# Patient Record
Sex: Female | Born: 1935 | Race: White | Hispanic: No | Marital: Married | Smoking: Never smoker
Health system: Southern US, Community
[De-identification: ages and names within clinical notes are randomized; demographics above are authoritative.]

## PROBLEM LIST (undated history)

## (undated) DIAGNOSIS — M8580 Other specified disorders of bone density and structure, unspecified site: Secondary | ICD-10-CM

## (undated) DIAGNOSIS — H409 Unspecified glaucoma: Secondary | ICD-10-CM

## (undated) DIAGNOSIS — H018 Other specified inflammations of eyelid: Secondary | ICD-10-CM

## (undated) DIAGNOSIS — K769 Liver disease, unspecified: Secondary | ICD-10-CM

## (undated) DIAGNOSIS — M1990 Unspecified osteoarthritis, unspecified site: Secondary | ICD-10-CM

## (undated) DIAGNOSIS — M549 Dorsalgia, unspecified: Secondary | ICD-10-CM

## (undated) DIAGNOSIS — R0600 Dyspnea, unspecified: Secondary | ICD-10-CM

## (undated) DIAGNOSIS — R519 Headache, unspecified: Secondary | ICD-10-CM

## (undated) DIAGNOSIS — K219 Gastro-esophageal reflux disease without esophagitis: Secondary | ICD-10-CM

## (undated) DIAGNOSIS — I700 Atherosclerosis of aorta: Secondary | ICD-10-CM

## (undated) DIAGNOSIS — H0189 Other specified inflammation of unspecified eye, unspecified eyelid: Secondary | ICD-10-CM

## (undated) DIAGNOSIS — I10 Essential (primary) hypertension: Secondary | ICD-10-CM

## (undated) DIAGNOSIS — Z1589 Genetic susceptibility to other disease: Secondary | ICD-10-CM

## (undated) DIAGNOSIS — K56609 Unspecified intestinal obstruction, unspecified as to partial versus complete obstruction: Secondary | ICD-10-CM

## (undated) DIAGNOSIS — C189 Malignant neoplasm of colon, unspecified: Secondary | ICD-10-CM

## (undated) DIAGNOSIS — S22080A Wedge compression fracture of T11-T12 vertebra, initial encounter for closed fracture: Secondary | ICD-10-CM

## (undated) DIAGNOSIS — I5033 Acute on chronic diastolic (congestive) heart failure: Secondary | ICD-10-CM

## (undated) DIAGNOSIS — Z8719 Personal history of other diseases of the digestive system: Secondary | ICD-10-CM

## (undated) DIAGNOSIS — T7840XA Allergy, unspecified, initial encounter: Secondary | ICD-10-CM

## (undated) DIAGNOSIS — I499 Cardiac arrhythmia, unspecified: Secondary | ICD-10-CM

## (undated) DIAGNOSIS — K632 Fistula of intestine: Secondary | ICD-10-CM

## (undated) DIAGNOSIS — S42009A Fracture of unspecified part of unspecified clavicle, initial encounter for closed fracture: Secondary | ICD-10-CM

## (undated) DIAGNOSIS — J189 Pneumonia, unspecified organism: Secondary | ICD-10-CM

## (undated) DIAGNOSIS — R911 Solitary pulmonary nodule: Secondary | ICD-10-CM

## (undated) DIAGNOSIS — Z933 Colostomy status: Secondary | ICD-10-CM

## (undated) DIAGNOSIS — D649 Anemia, unspecified: Secondary | ICD-10-CM

## (undated) DIAGNOSIS — R51 Headache: Secondary | ICD-10-CM

## (undated) DIAGNOSIS — D509 Iron deficiency anemia, unspecified: Secondary | ICD-10-CM

## (undated) DIAGNOSIS — C50411 Malignant neoplasm of upper-outer quadrant of right female breast: Secondary | ICD-10-CM

## (undated) DIAGNOSIS — Z1509 Genetic susceptibility to other malignant neoplasm: Secondary | ICD-10-CM

## (undated) HISTORY — DX: Allergy, unspecified, initial encounter: T78.40XA

## (undated) HISTORY — DX: Unspecified osteoarthritis, unspecified site: M19.90

## (undated) HISTORY — DX: Headache, unspecified: R51.9

## (undated) HISTORY — DX: Fracture of unspecified part of unspecified clavicle, initial encounter for closed fracture: S42.009A

## (undated) HISTORY — PX: BREAST EXCISIONAL BIOPSY: SUR124

## (undated) HISTORY — DX: Essential (primary) hypertension: I10

## (undated) HISTORY — DX: Malignant neoplasm of upper-outer quadrant of right female breast: C50.411

## (undated) HISTORY — DX: Dorsalgia, unspecified: M54.9

## (undated) HISTORY — DX: Other specified disorders of bone density and structure, unspecified site: M85.80

## (undated) HISTORY — DX: Headache: R51

## (undated) HISTORY — PX: COLONOSCOPY: SHX174

---

## 1968-07-21 HISTORY — PX: ABDOMINAL HYSTERECTOMY: SHX81

## 2005-07-08 ENCOUNTER — Ambulatory Visit: Payer: Self-pay

## 2007-03-09 ENCOUNTER — Ambulatory Visit: Payer: Self-pay

## 2007-03-31 ENCOUNTER — Emergency Department: Payer: Self-pay | Admitting: Emergency Medicine

## 2007-05-13 ENCOUNTER — Encounter: Payer: Self-pay | Admitting: Specialist

## 2007-05-22 ENCOUNTER — Encounter: Payer: Self-pay | Admitting: Specialist

## 2007-06-21 ENCOUNTER — Encounter: Payer: Self-pay | Admitting: Specialist

## 2007-10-11 ENCOUNTER — Ambulatory Visit: Payer: Self-pay | Admitting: General Surgery

## 2008-03-09 ENCOUNTER — Ambulatory Visit: Payer: Self-pay | Admitting: Endocrinology

## 2009-04-23 ENCOUNTER — Ambulatory Visit: Payer: Self-pay | Admitting: Endocrinology

## 2011-05-12 ENCOUNTER — Ambulatory Visit: Payer: Self-pay | Admitting: Family Medicine

## 2012-03-29 DIAGNOSIS — G8929 Other chronic pain: Secondary | ICD-10-CM | POA: Insufficient documentation

## 2012-10-20 ENCOUNTER — Ambulatory Visit: Payer: Self-pay | Admitting: Family Medicine

## 2012-10-21 ENCOUNTER — Ambulatory Visit: Payer: Self-pay | Admitting: Family Medicine

## 2012-11-04 ENCOUNTER — Other Ambulatory Visit: Payer: Self-pay | Admitting: *Deleted

## 2012-11-04 ENCOUNTER — Ambulatory Visit (INDEPENDENT_AMBULATORY_CARE_PROVIDER_SITE_OTHER): Payer: Medicare Other | Admitting: General Surgery

## 2012-11-04 ENCOUNTER — Encounter: Payer: Self-pay | Admitting: General Surgery

## 2012-11-04 VITALS — BP 124/72 | HR 68 | Resp 12 | Ht 62.0 in | Wt 145.0 lb

## 2012-11-04 DIAGNOSIS — R920 Mammographic microcalcification found on diagnostic imaging of breast: Secondary | ICD-10-CM

## 2012-11-04 NOTE — Progress Notes (Signed)
Patient ID: Kristin Miller, female   DOB: [birth year], 77 y.o.   MRN: [id number]  Chief Complaint  Patient presents with  . Other    mammogram    HPI Kristin Miller is a 77 y.o. female here today for an abnormal mammogram done at [facility], October 21 2012.  States she has not noticed any breast issues.  She does remember falling 2 years ago "passed out" but no recent injuries. Denies any immediate family history of breast cancer only a niece. HPI  Past Medical History  Diagnosis Date  . Osteoporosis   . Hypertension   . Back pain     Past Surgical History  Procedure Laterality Date  . Abdominal hysterectomy  1970    No family history on file.  Social History History  Substance Use Topics  . Smoking status: Never Smoker   . Smokeless tobacco: Never Used  . Alcohol Use: No    No Known Allergies  Current Outpatient Prescriptions  Medication Sig Dispense Refill  . acetaminophen (TYLENOL) 500 MG tablet Take 500 mg by mouth every 6 (six) hours as needed for pain.      . calcium carbonate (OS-CAL - DOSED IN MG OF ELEMENTAL CALCIUM) 1250 MG tablet Take 2 tablets by mouth daily.      . cetirizine (ZYRTEC) 10 MG tablet Take 10 mg by mouth daily.      Marland Kitchen lisinopril-hydrochlorothiazide (PRINZIDE,ZESTORETIC) 20-12.5 MG per tablet Take 1 tablet by mouth daily.      . Multiple Vitamin (MULTIVITAMIN) capsule Take 1 capsule by mouth daily.       No current facility-administered medications for this visit.    Review of Systems Review of Systems  Constitutional: Negative.   Respiratory: Negative.   Cardiovascular: Negative.     Blood pressure 124/72, pulse 68, resp. rate 12, height 5\' 2"  (1.575 m), weight 145 lb (65.772 kg).  Physical Exam Physical Exam  Constitutional: She is oriented to person, place, and time. She appears well-developed and well-nourished.  Cardiovascular: Normal rate and regular rhythm.   Pulmonary/Chest: Effort normal and breath sounds normal. Right breast  exhibits no inverted nipple, no mass, no nipple discharge, no skin change and no tenderness. Left breast exhibits no inverted nipple, no mass, no nipple discharge, no skin change and no tenderness.  Lymphadenopathy:    She has no cervical adenopathy.    She has no axillary adenopathy.  Neurological: She is alert and oriented to person, place, and time.  Skin: Skin is warm and dry.   Thickening at 10 o'clock position right breast.  Data Reviewed Focal spot compression views of the right breast is October 21, 2012 showed a small cluster of indeterminate microcalcifications in the lateral breasts of clot position. No associated mass or architectural distortion. BI-RAD-4.  Assessment    Right breast microcalcifications.     Plan    Options for management were reviewed: 1) early stereotactic biopsy versus 2) 6 month followup exam to determine if progression occurs. Pros and cons of observation versus early biopsy were reviewed. The chance for missed invasive cancer is small.  At this time the patient desires observation, arrangements are in place for followup right breast mammogram in 6 months with an office visit to follow. Should she change her mind in the interval she has been encouraged to call and biopsy will be arranged.        Earline Mayotte 11/05/2012, 8:02 PM

## 2012-11-04 NOTE — Patient Instructions (Addendum)
Continue self breast exams. Call for any new breast questions or concerns. Monitor for 6 months or stereotactic procedure was reviewed with the patient.  If she changes her mind she will call back to schedule.

## 2012-11-04 NOTE — Progress Notes (Signed)
The patient has been asked to return to the office in six months for a unilateral right breast diagnostic mammogram with FSC views. 

## 2012-11-05 ENCOUNTER — Encounter: Payer: Self-pay | Admitting: General Surgery

## 2012-11-05 DIAGNOSIS — R920 Mammographic microcalcification found on diagnostic imaging of breast: Secondary | ICD-10-CM | POA: Insufficient documentation

## 2013-04-06 DIAGNOSIS — Z01419 Encounter for gynecological examination (general) (routine) without abnormal findings: Secondary | ICD-10-CM | POA: Insufficient documentation

## 2013-04-25 ENCOUNTER — Encounter: Payer: Self-pay | Admitting: General Surgery

## 2013-04-25 ENCOUNTER — Ambulatory Visit: Payer: Self-pay | Admitting: General Surgery

## 2013-05-03 ENCOUNTER — Ambulatory Visit (INDEPENDENT_AMBULATORY_CARE_PROVIDER_SITE_OTHER): Payer: Medicare Other | Admitting: General Surgery

## 2013-05-03 VITALS — BP 128/66 | HR 68 | Resp 12 | Ht 62.0 in | Wt 151.0 lb

## 2013-05-03 DIAGNOSIS — R920 Mammographic microcalcification found on diagnostic imaging of breast: Secondary | ICD-10-CM

## 2013-05-03 NOTE — Progress Notes (Signed)
Patient ID: Kristin Miller, female   DOB: 12-09-1935, 77 y.o.   MRN: [id number]  Chief Complaint  Patient presents with  . Follow-up    mammogram    HPI Kristin Miller is a 77 y.o. female.  Here for her 6 month follow up right breast mammogram done 04-25-13. When she was here in April right breast microcalcification's were present. She states the radiologist did recommended her to have a biopsy. No new breast issues. HPI  Past Medical History  Diagnosis Date  . Osteoporosis   . Hypertension   . Back pain     Past Surgical History  Procedure Laterality Date  . Abdominal hysterectomy  1970    No family history on file.  Social History History  Substance Use Topics  . Smoking status: Never Smoker   . Smokeless tobacco: Never Used  . Alcohol Use: No    No Known Allergies  Current Outpatient Prescriptions  Medication Sig Dispense Refill  . acetaminophen (TYLENOL) 500 MG tablet Take 500 mg by mouth every 6 (six) hours as needed for pain.      . calcium carbonate (OS-CAL - DOSED IN MG OF ELEMENTAL CALCIUM) 1250 MG tablet Take 2 tablets by mouth daily.      . cetirizine (ZYRTEC) 10 MG tablet Take 10 mg by mouth daily.      Marland Kitchen lisinopril-hydrochlorothiazide (PRINZIDE,ZESTORETIC) 20-12.5 MG per tablet Take 1 tablet by mouth daily.      . Multiple Vitamin (MULTIVITAMIN) capsule Take 1 capsule by mouth daily.       No current facility-administered medications for this visit.    Review of Systems Review of Systems  Constitutional: Negative.   Respiratory: Negative.   Cardiovascular: Negative.     Blood pressure 128/66, pulse 68, resp. rate 12, height 5\' 2"  (1.575 m), weight 151 lb (68.493 kg).  Physical Exam Physical Exam  Constitutional: She is oriented to person, place, and time. She appears well-developed and well-nourished.  Neck: Neck supple.  Cardiovascular: Normal rate, regular rhythm and normal heart sounds.   Pulmonary/Chest: Effort normal and breath sounds  normal. Right breast exhibits no inverted nipple, no mass, no nipple discharge, no skin change and no tenderness. Left breast exhibits no inverted nipple, no mass, no nipple discharge, no skin change and no tenderness.  Lymphadenopathy:    She has no cervical adenopathy.    She has no axillary adenopathy.  Neurological: She is alert and oriented to person, place, and time.  Skin: Skin is warm and dry.    Data Reviewed Right breast mammogram dated April 25, 2013 was reviewed. Cluster of suspicious calcifications in the slightly lateral posterior aspect of the breast. BI-RAD-4.  The films were reviewed and are unchanged from films obtained earlier this year. Review of films dating back to 2010 shows a foci of microcalcifications in this area. Slightly more prominent over the last 4 years.  Assessment    Right breast microcalcifications.     Plan    Options for management were reviewed: 1: Stereotactic biopsy versus 2: Continued observation.  At this time the patient desires observation. I think this is reasonable. Arrangements will be made for followup mammograms of both breasts in 6 months.        Earline Mayotte 05/04/2013, 8:58 PM

## 2013-05-03 NOTE — Patient Instructions (Addendum)
Continue self breast exams. Call office for any new breast issues or concerns. 

## 2013-05-04 ENCOUNTER — Encounter: Payer: Self-pay | Admitting: General Surgery

## 2013-05-04 DIAGNOSIS — R920 Mammographic microcalcification found on diagnostic imaging of breast: Secondary | ICD-10-CM | POA: Insufficient documentation

## 2013-05-27 ENCOUNTER — Emergency Department: Payer: Self-pay | Admitting: Emergency Medicine

## 2013-05-28 DIAGNOSIS — S42009A Fracture of unspecified part of unspecified clavicle, initial encounter for closed fracture: Secondary | ICD-10-CM

## 2013-05-28 HISTORY — DX: Fracture of unspecified part of unspecified clavicle, initial encounter for closed fracture: S42.009A

## 2013-10-03 DIAGNOSIS — R928 Other abnormal and inconclusive findings on diagnostic imaging of breast: Secondary | ICD-10-CM | POA: Insufficient documentation

## 2013-10-03 DIAGNOSIS — C4490 Unspecified malignant neoplasm of skin, unspecified: Secondary | ICD-10-CM | POA: Insufficient documentation

## 2013-11-09 ENCOUNTER — Encounter: Payer: Self-pay | Admitting: General Surgery

## 2013-11-28 ENCOUNTER — Ambulatory Visit (INDEPENDENT_AMBULATORY_CARE_PROVIDER_SITE_OTHER): Payer: Medicare HMO | Admitting: General Surgery

## 2013-11-28 ENCOUNTER — Encounter: Payer: Self-pay | Admitting: General Surgery

## 2013-11-28 VITALS — BP 130/76 | HR 76 | Resp 14 | Ht 61.0 in | Wt 149.0 lb

## 2013-11-28 DIAGNOSIS — R920 Mammographic microcalcification found on diagnostic imaging of breast: Secondary | ICD-10-CM | POA: Insufficient documentation

## 2013-11-28 NOTE — Patient Instructions (Addendum)
Patient to return 6 months right diagnotic mammogram BI.

## 2013-11-28 NOTE — Progress Notes (Signed)
Patient ID: Kristin Miller, female   DOB: 06-08-1936, 78 y.o.   MRN: [id number]  Chief Complaint  Patient presents with  . Follow-up    mammogram    HPI Kristin Miller is a 78 y.o. female who presents for a breast evaluation. The most recent mammogram was done on 11/08/13. Patient does perform regular self breast checks and gets regular mammograms done.  No new problems at this time.   HPI  Past Medical History  Diagnosis Date  . Osteoporosis   . Hypertension   . Back pain     Past Surgical History  Procedure Laterality Date  . Abdominal hysterectomy  1970    No family history on file.  Social History History  Substance Use Topics  . Smoking status: Never Smoker   . Smokeless tobacco: Never Used  . Alcohol Use: No    No Known Allergies  Current Outpatient Prescriptions  Medication Sig Dispense Refill  . acetaminophen (TYLENOL) 500 MG tablet Take 500 mg by mouth every 6 (six) hours as needed for pain.      . calcium carbonate (OS-CAL - DOSED IN MG OF ELEMENTAL CALCIUM) 1250 MG tablet Take 2 tablets by mouth daily.      . cetirizine (ZYRTEC) 10 MG tablet Take 10 mg by mouth daily.      Marland Kitchen lisinopril-hydrochlorothiazide (PRINZIDE,ZESTORETIC) 20-12.5 MG per tablet Take 1 tablet by mouth daily.      . Multiple Vitamin (MULTIVITAMIN) capsule Take 1 capsule by mouth daily.       No current facility-administered medications for this visit.    Review of Systems Review of Systems  Constitutional: Negative.   Respiratory: Negative.   Cardiovascular: Negative.     Blood pressure 130/76, pulse 76, resp. rate 14, height 5\' 1"  (1.549 m), weight 149 lb (67.586 kg).  Physical Exam Physical Exam  Constitutional: She is oriented to person, place, and time. She appears well-developed and well-nourished.  Eyes: Conjunctivae are normal.  Neck: Neck supple.  Cardiovascular: Normal rate and normal heart sounds.   Pulmonary/Chest: Breath sounds normal. Right breast exhibits no  inverted nipple, no mass, no nipple discharge, no skin change and no tenderness. Left breast exhibits no inverted nipple, no mass, no nipple discharge, no skin change and no tenderness.  Lymphadenopathy:    She has no cervical adenopathy.    She has no axillary adenopathy.  Neurological: She is alert and oriented to person, place, and time.  Skin: Skin is warm and dry.    Data Reviewed Bilateral mammograms completed at [facility] dated November 08, 2013 were reviewed and compared to her October 2014 [facility] studies. Stable microcalcifications are identified. BI-RAD-3. Recommendation for a 6 month followup exam of the right breast.  Assessment    Stable right breast microcalcifications.     Plan    Arrangements will be made for right breast mammogram in 6 months. Assuming ongoing stability these will be followed to 2 years and then she'll be released annual screening studies.     PCP: Kristin Miller Kristin Miller 11/28/2013, 9:16 PM

## 2014-04-20 ENCOUNTER — Encounter: Payer: Self-pay | Admitting: General Surgery

## 2014-04-27 ENCOUNTER — Encounter: Payer: Self-pay | Admitting: General Surgery

## 2014-04-27 ENCOUNTER — Ambulatory Visit (INDEPENDENT_AMBULATORY_CARE_PROVIDER_SITE_OTHER): Payer: Medicare HMO | Admitting: General Surgery

## 2014-04-27 VITALS — BP 140/84 | HR 80 | Resp 12 | Ht 61.0 in | Wt 148.0 lb

## 2014-04-27 DIAGNOSIS — R920 Mammographic microcalcification found on diagnostic imaging of breast: Secondary | ICD-10-CM

## 2014-04-27 NOTE — Progress Notes (Signed)
Patient ID: Kristin Miller, female   DOB: 1936-06-13, 78 y.o.   MRN: [id number]  Chief Complaint  Patient presents with  . Follow-up    mammogram    HPI Kristin Miller is a 78 y.o. female.  who presents for her 6 month follow up mammogram and breast evaluation. The most recent mammogram was done on 04-20-14.  Patient does perform regular self breast checks and gets regular mammograms done.  No new breast complaints.  HPI  Past Medical History  Diagnosis Date  . Osteoporosis   . Hypertension   . Back pain     Past Surgical History  Procedure Laterality Date  . Abdominal hysterectomy  1970    No family history on file.  Social History History  Substance Use Topics  . Smoking status: Never Smoker   . Smokeless tobacco: Never Used  . Alcohol Use: No    No Known Allergies  Current Outpatient Prescriptions  Medication Sig Dispense Refill  . acetaminophen (TYLENOL) 500 MG tablet Take 500 mg by mouth every 6 (six) hours as needed for pain.      . calcium carbonate (OS-CAL - DOSED IN MG OF ELEMENTAL CALCIUM) 1250 MG tablet Take 1 tablet by mouth 3 (three) times daily with meals.       . cetirizine (ZYRTEC) 10 MG tablet Take 10 mg by mouth as needed.       Marland Kitchen lisinopril-hydrochlorothiazide (PRINZIDE,ZESTORETIC) 20-12.5 MG per tablet Take 1 tablet by mouth daily.      . Multiple Vitamin (MULTIVITAMIN) capsule Take 1 capsule by mouth daily.       No current facility-administered medications for this visit.    Review of Systems Review of Systems  Blood pressure 140/84, pulse 80, resp. rate 12, height 5\' 1"  (1.549 m), weight 148 lb (67.132 kg).  Physical Exam Physical Exam  Constitutional: She is oriented to person, place, and time. She appears well-developed and well-nourished.  Neck: Neck supple.  Cardiovascular: Normal rate, regular rhythm and normal heart sounds.   Pulmonary/Chest: Effort normal and breath sounds normal. Right breast exhibits no inverted nipple, no  mass, no nipple discharge, no skin change and no tenderness. Left breast exhibits no inverted nipple, no mass, no nipple discharge, no skin change and no tenderness.  Lymphadenopathy:    She has no cervical adenopathy.    She has no axillary adenopathy.  Neurological: She is alert and oriented to person, place, and time.  Skin: Skin is warm and dry.    Data Reviewed Bilateral mammograms at April 20, 2014 at [facility] were reviewed. The previously identified 4 mm area of microcalcifications are unchanged, now one year status post initial observation. BI-RAD-2.  Assessment    Benign breast exam, stable calcifications.     Plan    We will plan for a final followup with bilateral diagnostic mammograms in one year.      PCP: Arta Silence 04/30/2014, 9:08 PM

## 2014-04-27 NOTE — Patient Instructions (Signed)
Continue self breast exams. Call office for any new breast issues or concerns. 

## 2014-05-22 ENCOUNTER — Encounter: Payer: Self-pay | Admitting: General Surgery

## 2014-07-31 ENCOUNTER — Ambulatory Visit (INDEPENDENT_AMBULATORY_CARE_PROVIDER_SITE_OTHER): Payer: PPO | Admitting: Nurse Practitioner

## 2014-07-31 ENCOUNTER — Encounter: Payer: Self-pay | Admitting: Nurse Practitioner

## 2014-07-31 ENCOUNTER — Encounter (INDEPENDENT_AMBULATORY_CARE_PROVIDER_SITE_OTHER): Payer: Self-pay

## 2014-07-31 VITALS — BP 140/72 | HR 79 | Temp 98.2°F | Resp 14 | Ht 61.0 in | Wt 150.4 lb

## 2014-07-31 DIAGNOSIS — I10 Essential (primary) hypertension: Secondary | ICD-10-CM

## 2014-07-31 DIAGNOSIS — M545 Low back pain, unspecified: Secondary | ICD-10-CM

## 2014-07-31 DIAGNOSIS — Z7189 Other specified counseling: Secondary | ICD-10-CM

## 2014-07-31 DIAGNOSIS — Z7689 Persons encountering health services in other specified circumstances: Secondary | ICD-10-CM

## 2014-07-31 DIAGNOSIS — C44619 Basal cell carcinoma of skin of left upper limb, including shoulder: Secondary | ICD-10-CM | POA: Insufficient documentation

## 2014-07-31 NOTE — Patient Instructions (Signed)
Nice to meet you and welcome to [facility]!   Please schedule an appointment for after March 17th, 2016 for a Physical.   Call us if you need [place] in the mean time.   [facility] has our own version of MyChart and the activation code is on the last page of this handout.

## 2014-07-31 NOTE — Progress Notes (Signed)
Subjective:    Patient ID: Kristin Miller Miller, female    DOB: [birth year], 79 y.o.   MRN: [id number]  HPI  Kristin Miller Miller is a 79 yo female establishing as a new patient. She also has a CC of back pain.   1)Immunizations- UTD except for Zostavax, discussed  Mammogram- hx of microcalcifications and is followed by Dr. Bary Castilla.   Pap- not age appropriate  Bone Density- 11/08/2013 with osteopenia   Colonoscopy- UTD   2) Chronic Problems-  Low back pain- Back pain- Takes acetaminophen, ibuprofen,   or Aleve and is helpful. When she is active her back   bothers her more, pain is daily and is 4/10 best, takes   breaks when doing yard work and other such   activities.   HTN- Takes BP at home occasionally, runs 130's or less   over 70's or less. Compliant on medication   Osteopenia- Tried bisphosphonates (unsure which) without improvement.   3) Acute Problems- None today.   Review of Systems  Constitutional: Negative for fever, chills, diaphoresis, fatigue and unexpected weight change.  HENT: Negative for tinnitus and trouble swallowing.   Eyes: Negative for visual disturbance.  Respiratory: Negative for cough, chest tightness, shortness of breath and wheezing.   Cardiovascular: Negative for chest pain, palpitations and leg swelling.  Gastrointestinal: Negative for nausea, vomiting, abdominal pain, diarrhea, constipation and blood in stool.  Endocrine: Negative for cold intolerance and heat intolerance.  Genitourinary: Negative for dysuria, hematuria, vaginal discharge and vaginal pain.  Musculoskeletal: Positive for back pain. Negative for myalgias, arthralgias and gait problem.  Skin: Negative for color change and rash.  Neurological: Negative for dizziness, weakness, numbness and headaches.  Hematological: Does not bruise/bleed easily.  Psychiatric/Behavioral: Negative for suicidal ideas and sleep disturbance. The patient is not nervous/anxious.    Past Medical History  Diagnosis Date  .  Osteoporosis   . Hypertension   . Back pain   . Arthritis   . Frequent headaches   . Collar bone fracture 05/28/2013    Fractured Collar bone on left    History   Social History  . Marital Status: Married    Spouse Name: N/A    Number of Children: N/A  . Years of Education: N/A   Occupational History  . Not on file.   Social History Main Topics  . Smoking status: Never Smoker   . Smokeless tobacco: Never Used  . Alcohol Use: No  . Drug Use: No  . Sexual Activity: Not on file   Other Topics Concern  . Not on file   Social History Narrative    Past Surgical History  Procedure Laterality Date  . Abdominal hysterectomy  1970    Partial    Family History  Problem Relation Age of Onset  . Mental illness Mother     Dementia  . Stroke Father   . Cancer Brother     Lung Cancer    No Known Allergies  Current Outpatient Prescriptions on File Prior to Visit  Medication Sig Dispense Refill  . acetaminophen (TYLENOL) 500 MG tablet Take 500 mg by mouth every 6 (six) hours as needed for pain.    . calcium carbonate (OS-CAL - DOSED IN MG OF ELEMENTAL CALCIUM) 1250 MG tablet Take 1 tablet by mouth 3 (three) times daily with meals.     . cetirizine (ZYRTEC) 10 MG tablet Take 10 mg by mouth as needed.     Marland Kitchen lisinopril-hydrochlorothiazide (PRINZIDE,ZESTORETIC) 20-12.5 MG per tablet Take  1 tablet by mouth daily.    . Multiple Vitamin (MULTIVITAMIN) capsule Take 1 capsule by mouth daily.     No current facility-administered medications on file prior to visit.      Objective:   Physical Exam  Constitutional: She is oriented to person, place, and time. She appears well-developed and well-nourished. No distress.  HENT:  Head: Normocephalic and atraumatic.  Right Ear: External ear normal.  Left Ear: External ear normal.  Neck: Normal range of motion. Neck supple. No thyromegaly present.  Cardiovascular: Normal rate, regular rhythm, normal heart sounds and intact distal  pulses.  Exam reveals no gallop and no friction rub.   No murmur heard. Pulmonary/Chest: Effort normal and breath sounds normal. No respiratory distress. She has no wheezes. She has no rales. She exhibits no tenderness.  Musculoskeletal:  Tenderness of bilateral paraspinal muscles.   Lymphadenopathy:    She has no cervical adenopathy.  Neurological: She is alert and oriented to person, place, and time. No cranial nerve deficit. She exhibits normal muscle tone. Coordination normal.  Skin: Skin is warm and dry. No rash noted. She is not diaphoretic.  Psychiatric: She has a normal mood and affect. Her behavior is normal. Judgment and thought content normal.   BP 140/72 mmHg  Pulse 79  Temp(Src) 98.2 F (36.8 C) (Oral)  Resp 14  Ht 5\' 1"  (1.549 m)  Wt 150 lb 6.4 oz (68.221 kg)  BMI 28.43 kg/m2  SpO2 99%     Assessment & Plan:

## 2014-07-31 NOTE — Progress Notes (Signed)
Pre visit review using our clinic review tool, if applicable. No additional management support is needed unless otherwise documented below in the visit note. 

## 2014-08-02 DIAGNOSIS — M545 Low back pain, unspecified: Secondary | ICD-10-CM | POA: Insufficient documentation

## 2014-08-02 DIAGNOSIS — I10 Essential (primary) hypertension: Secondary | ICD-10-CM | POA: Insufficient documentation

## 2014-08-02 DIAGNOSIS — Z7689 Persons encountering health services in other specified circumstances: Secondary | ICD-10-CM | POA: Insufficient documentation

## 2014-08-02 NOTE — Assessment & Plan Note (Signed)
Stable. Pt on Tylenol 500 mg every 6 hours as needed. FU prn worsening/failure to improve.

## 2014-08-02 NOTE — Assessment & Plan Note (Signed)
Pt stable on Lisinopril-HCTZ 20-12.5 continue. FU prn.

## 2014-08-02 NOTE — Assessment & Plan Note (Signed)
Stable. Meeting pt today. Discussed history, labs, chronic issues, acute issues, and health maintenance basics. FU prn.

## 2014-08-23 ENCOUNTER — Encounter: Payer: Self-pay | Admitting: Family Medicine

## 2014-10-09 ENCOUNTER — Encounter: Payer: Self-pay | Admitting: Nurse Practitioner

## 2014-10-09 ENCOUNTER — Ambulatory Visit (INDEPENDENT_AMBULATORY_CARE_PROVIDER_SITE_OTHER): Payer: PPO | Admitting: Nurse Practitioner

## 2014-10-09 VITALS — BP 122/80 | HR 82 | Temp 97.9°F | Resp 14 | Ht 61.0 in | Wt 152.2 lb

## 2014-10-09 DIAGNOSIS — Z0000 Encounter for general adult medical examination without abnormal findings: Secondary | ICD-10-CM

## 2014-10-09 DIAGNOSIS — Z13220 Encounter for screening for lipoid disorders: Secondary | ICD-10-CM

## 2014-10-09 DIAGNOSIS — Z1329 Encounter for screening for other suspected endocrine disorder: Secondary | ICD-10-CM | POA: Diagnosis not present

## 2014-10-09 DIAGNOSIS — Z131 Encounter for screening for diabetes mellitus: Secondary | ICD-10-CM

## 2014-10-09 DIAGNOSIS — Z130 Encounter for screening for diseases of the blood and blood-forming organs and certain disorders involving the immune mechanism: Secondary | ICD-10-CM | POA: Diagnosis not present

## 2014-10-09 LAB — HEMOGLOBIN A1C: HEMOGLOBIN A1C: 5.8 % (ref 4.6–6.5)

## 2014-10-09 LAB — CBC WITH DIFFERENTIAL/PLATELET
BASOS PCT: 1.3 % (ref 0.0–3.0)
Basophils Absolute: 0.1 10*3/uL (ref 0.0–0.1)
EOS PCT: 1.7 % (ref 0.0–5.0)
Eosinophils Absolute: 0.1 10*3/uL (ref 0.0–0.7)
HCT: 39.4 % (ref 36.0–46.0)
HEMOGLOBIN: 12.8 g/dL (ref 12.0–15.0)
Lymphocytes Relative: 27.9 % (ref 12.0–46.0)
Lymphs Abs: 2.1 10*3/uL (ref 0.7–4.0)
MCHC: 32.5 g/dL (ref 30.0–36.0)
MCV: 82.1 fl (ref 78.0–100.0)
Monocytes Absolute: 0.8 10*3/uL (ref 0.1–1.0)
Monocytes Relative: 10.8 % (ref 3.0–12.0)
NEUTROS ABS: 4.4 10*3/uL (ref 1.4–7.7)
NEUTROS PCT: 58.3 % (ref 43.0–77.0)
Platelets: 313 10*3/uL (ref 150.0–400.0)
RBC: 4.8 Mil/uL (ref 3.87–5.11)
RDW: 17 % — ABNORMAL HIGH (ref 11.5–15.5)
WBC: 7.6 10*3/uL (ref 4.0–10.5)

## 2014-10-09 LAB — LIPID PANEL
CHOL/HDL RATIO: 4
CHOLESTEROL: 186 mg/dL (ref 0–200)
HDL: 53 mg/dL (ref >39.00)
LDL Cholesterol: 107 mg/dL — ABNORMAL HIGH (ref 0–99)
NonHDL: 133
Triglycerides: 128 mg/dL (ref 0.0–149.0)
VLDL: 25.6 mg/dL (ref 0.0–40.0)

## 2014-10-09 LAB — COMPREHENSIVE METABOLIC PANEL
ALK PHOS: 50 U/L (ref 39–117)
ALT: 19 U/L (ref 0–35)
AST: 24 U/L (ref 0–37)
Albumin: 4 g/dL (ref 3.5–5.2)
BUN: 17 mg/dL (ref 6–23)
CO2: 27 mEq/L (ref 19–32)
CREATININE: 0.83 mg/dL (ref 0.40–1.20)
Calcium: 9.5 mg/dL (ref 8.4–10.5)
Chloride: 104 mEq/L (ref 96–112)
GFR: 70.92 mL/min (ref >60.00)
Glucose, Bld: 97 mg/dL (ref 70–99)
Potassium: 4.2 mEq/L (ref 3.5–5.1)
Sodium: 139 mEq/L (ref 135–145)
Total Bilirubin: 0.4 mg/dL (ref 0.2–1.2)
Total Protein: 6.8 g/dL (ref 6.0–8.3)

## 2014-10-09 LAB — TSH: TSH: 2.91 u[IU]/mL (ref 0.35–4.50)

## 2014-10-09 NOTE — Progress Notes (Signed)
   Subjective:    Patient ID: Kristin Miller, female    DOB: 08-Jan-1936, 79 y.o.   MRN: [id number]  HPI  Kristin Miller is a 79 yo female with here for her subsequent AWV.   Health Screenings  Mammogram- 11/08/2013 with Dr. Bary Castilla yearly  Colonoscopy- Age not of benefit Bone Density- 2015 April  Glaucoma- 2016- normal  Hearing- N/A Hemoglobin A1C- Due this year  Cholesterol- 2015 Due this year  Social  Alcohol intake- Denies Smoking history- Denies Smokers in home- Denies  Illicit drug use- Denies  Exercise- Walking and staying active  Diet- Eats at home often  Sexually Active- Not currently  Multiple Partners- Husband   Safety  Patient feesl safe at home- Yes Patient does have smoke detectors at home- Yes  Patient does wear sunscreen or protective clothing when in direct sunlight- Most of the time Patient does wear seat belt when driving or riding with others- Yes   Activities of Daily Living Patient can do their own household chores. Denies needing assistance with: driving, feeding themselves, getting from bed to chair, getting to the toilet, bathing/showering, dressing, managing money, climbing flight of stairs, or preparing meals.   Depression Screen Patient denies losing interest in daily life, feeling hopeless, or crying easily over simple problems.   Fall Screen Patient denies being afraid of falling or falling in the last year.   Memory Screen Patient denies problems with memory, misplacing items, and is able to balance checkbook/bank accounts. Patient is alert, normal appearance, oriented to person/place/and time. Correctly identified the president of the [place], recall of 3 objects, and performing simple calculations.  Patient displays appropriate judgement and can read correct time from watch face.   Immunizations The following Immunizations are up to date: Influenza, pneumonia, and tetanus.   Other providers-  Dr. Bary Castilla- For breast microcalcifications Dr.  Nehemiah Massed- Dermatologist  Dr. Mack Guise for shoulder  [facility]   Review of Systems No ROS for AWV    Objective:   Physical Exam  Constitutional:  BP 122/80 mmHg  Pulse 82  Temp(Src) 97.9 F (36.6 C) (Oral)  Resp 14  Ht 5\' 1"  (1.549 m)  Wt 152 lb 4 oz (69.06 kg)  BMI 28.78 kg/m2  SpO2 99%   No Physical Exam for AWV     Assessment & Plan:  This is a routine wellness examination for this patient.  I reviewed all health maintenance protocols including mammography, colonoscopy, bone density.  Needed referrals placed. Age and diagnosis appropriate screening labs were ordered.  Her immunization history was reviewed and appropriate vaccinations were ordered.  Her current medications and allergies were reviewed and needed refills of her chronic medications were ordered if needed. The plan for yearly health maintenance was discussed.    MEDICARE ATTESTATION I have personally reviewed:  The patient's medical and social history. The use of alcohol, tobacco and illicit drugs. The current medications and supplements. The patient's function ability including ADLs, fall risks, home safety risks, cognitive, and hearing and visual impairment.   Diet and physical activities. Evaluation for depression and mood disorders.    The patient's weight, height, BMI and visual acuity have been recorded in the chart.  I have made referrals, counseled and provided education to the patient based on review of the above.

## 2014-10-09 NOTE — Patient Instructions (Signed)

## 2014-10-09 NOTE — Progress Notes (Signed)
Pre visit review using our clinic review tool, if applicable. No additional management support is needed unless otherwise documented below in the visit note. 

## 2014-10-09 NOTE — Assessment & Plan Note (Signed)
Annual wellness visit today. Will obtain labs for screenings this year. Pt has past AWV at another location over a year ago she reports.

## 2014-10-10 ENCOUNTER — Encounter: Payer: Self-pay | Admitting: *Deleted

## 2015-02-27 ENCOUNTER — Other Ambulatory Visit: Payer: Self-pay

## 2015-02-27 DIAGNOSIS — R920 Mammographic microcalcification found on diagnostic imaging of breast: Secondary | ICD-10-CM

## 2015-04-17 ENCOUNTER — Other Ambulatory Visit: Payer: Self-pay | Admitting: Nurse Practitioner

## 2015-04-17 ENCOUNTER — Ambulatory Visit (INDEPENDENT_AMBULATORY_CARE_PROVIDER_SITE_OTHER): Payer: PPO | Admitting: Nurse Practitioner

## 2015-04-17 ENCOUNTER — Encounter: Payer: Self-pay | Admitting: Nurse Practitioner

## 2015-04-17 VITALS — BP 140/82 | HR 71 | Temp 98.0°F | Resp 14 | Ht 61.0 in | Wt 147.4 lb

## 2015-04-17 DIAGNOSIS — I10 Essential (primary) hypertension: Secondary | ICD-10-CM

## 2015-04-17 DIAGNOSIS — M545 Low back pain, unspecified: Secondary | ICD-10-CM

## 2015-04-17 LAB — BASIC METABOLIC PANEL
BUN: 21 mg/dL (ref 6–23)
CHLORIDE: 104 meq/L (ref 96–112)
CO2: 27 mEq/L (ref 19–32)
Calcium: 9.4 mg/dL (ref 8.4–10.5)
Creatinine, Ser: 0.81 mg/dL (ref 0.40–1.20)
GFR: 72.84 mL/min (ref >60.00)
Glucose, Bld: 102 mg/dL — ABNORMAL HIGH (ref 70–99)
POTASSIUM: 4 meq/L (ref 3.5–5.1)
SODIUM: 140 meq/L (ref 135–145)

## 2015-04-17 MED ORDER — LISINOPRIL-HYDROCHLOROTHIAZIDE 20-12.5 MG PO TABS: 1.0000 | ORAL_TABLET | Freq: Every day | ORAL | Status: DC

## 2015-04-17 NOTE — Assessment & Plan Note (Addendum)
Worsening. Pt has seen Dr. Babette Relic in the past and [facility]. She has new insurance and I told her I don't think [facility] will be in her network, but we can try. If not then refer to Dr. Lynann Bologna for further work up. She will need new imaging. Described conservative therapy measures and she is interested, but would like to speak with a surgeon first.

## 2015-04-17 NOTE — Patient Instructions (Addendum)
Dr. Trixie [facility].   See you in March or sooner if needed.

## 2015-04-17 NOTE — Assessment & Plan Note (Signed)
Stable.   BP Readings from Last 3 Encounters:  04/17/15 140/82  10/09/14 122/80  07/31/14 140/72   Pt at goal according to [place] [facility] for her age group. Continue regimen

## 2015-04-17 NOTE — Progress Notes (Signed)
Pre visit review using our clinic review tool, if applicable. No additional management support is needed unless otherwise documented below in the visit note. 

## 2015-04-17 NOTE — Progress Notes (Signed)
Patient ID: Kristin Miller, female    DOB: 1935-12-25  Age: 79 y.o. MRN: [id number]  CC: Follow-up   HPI Kristin Miller presents for Follow up of HTN and back pain.   1) HTN- Needs refills on lisinopril-HCTZ 20-12.5  BP 140/82 today.   2) Injections in her back in past, osteoporosis, was on Tylenol 500 mg every 6 hours as needed. Fell at Dr. Barkley Boards office 5 years ago- syncopal episode after an injection of pcn and prednisone. No recent x-rays.   History Kristin Miller has a past medical history of Osteoporosis; Hypertension; Back pain; Arthritis; Frequent headaches; and Collar bone fracture (05/28/2013).   She has past surgical history that includes Abdominal hysterectomy (1970).   Her family history includes Cancer in her brother; Mental illness in her mother; Stroke in her father.She reports that she has never smoked. She has never used smokeless tobacco. She reports that she does not drink alcohol or use illicit drugs.  Outpatient Prescriptions Prior to Visit  Medication Sig Dispense Refill  . acetaminophen (TYLENOL) 500 MG tablet Take 500 mg by mouth every 6 (six) hours as needed for pain.    . calcium carbonate (OS-CAL - DOSED IN MG OF ELEMENTAL CALCIUM) 1250 MG tablet Take 1 tablet by mouth 3 (three) times daily with meals.     . cetirizine (ZYRTEC) 10 MG tablet Take 10 mg by mouth as needed.     . Multiple Vitamin (MULTIVITAMIN) capsule Take 1 capsule by mouth daily.    Marland Kitchen lisinopril-hydrochlorothiazide (PRINZIDE,ZESTORETIC) 20-12.5 MG per tablet Take 1 tablet by mouth daily.     No facility-administered medications prior to visit.    ROS Review of Systems  Constitutional: Negative for fever, chills, diaphoresis and fatigue.  Respiratory: Negative for chest tightness, shortness of breath and wheezing.   Cardiovascular: Negative for chest pain, palpitations and leg swelling.  Gastrointestinal: Negative for nausea, vomiting and diarrhea.  Musculoskeletal: Positive for myalgias and  back pain. Negative for joint swelling, arthralgias, gait problem, neck pain and neck stiffness.  Neurological: Negative for dizziness, numbness and headaches.  Psychiatric/Behavioral: The patient is not nervous/anxious.    Objective:  BP 140/82 mmHg  Pulse 71  Temp(Src) 98 F (36.7 C)  Resp 14  Ht 5\' 1"  (1.549 m)  Wt 147 lb 6.4 oz (66.86 kg)  BMI 27.87 kg/m2  SpO2 96%  Physical Exam  Constitutional: She is oriented to person, place, and time. She appears well-developed and well-nourished. No distress.  HENT:  Head: Normocephalic and atraumatic.  Right Ear: External ear normal.  Left Ear: External ear normal.  Cardiovascular: Normal rate, regular rhythm and normal heart sounds.  Exam reveals no gallop and no friction rub.   No murmur heard. Pulmonary/Chest: Effort normal and breath sounds normal. No respiratory distress. She has no wheezes. She has no rales. She exhibits no tenderness.  Musculoskeletal: She exhibits tenderness. She exhibits no edema.  Slight generalized tenderness from under bra strap to lower back  Neurological: She is alert and oriented to person, place, and time. No cranial nerve deficit. She exhibits normal muscle tone. Coordination normal.  Iliopsoas 5/5 Bilateral, Tib anterior 5/5 bilateral, EHL 5/5 bilateral, no ankle clonus, intact heel/toe/sequential walking, sensation intact upper and lower extremities. Straight leg raise negative bilaterally.   Skin: Skin is warm and dry. No rash noted. She is not diaphoretic.  Psychiatric: She has a normal mood and affect. Her behavior is normal. Judgment and thought content normal.   Assessment & Plan:  Kristin Miller was seen today for follow-up.  Diagnoses and all orders for this visit:  Essential hypertension -     Basic Metabolic Panel (BMET)  Bilateral low back pain without sciatica -     Ambulatory referral to Orthopedic Surgery  Other orders -     lisinopril-hydrochlorothiazide (PRINZIDE,ZESTORETIC) 20-12.5  MG per tablet; Take 1 tablet by mouth daily.   I am having Kristin Miller maintain her acetaminophen, calcium carbonate, cetirizine, multivitamin, and lisinopril-hydrochlorothiazide.  Meds ordered this encounter  Medications  . lisinopril-hydrochlorothiazide (PRINZIDE,ZESTORETIC) 20-12.5 MG per tablet    Sig: Take 1 tablet by mouth daily.    Dispense:  90 tablet    Refill:  3    Order Specific Question:  Supervising Jacobe Study    Answer:  Kristin Miller [[id number]]     Follow-up: Return in about 6 months (around 10/15/2015).

## 2015-04-25 ENCOUNTER — Ambulatory Visit
Admission: RE | Admit: 2015-04-25 | Discharge: 2015-04-25 | Disposition: A | Discharge status: Home / Self Care | Payer: PPO | Source: Ambulatory Visit | Attending: General Surgery | Admitting: General Surgery

## 2015-04-25 DIAGNOSIS — R920 Mammographic microcalcification found on diagnostic imaging of breast: Secondary | ICD-10-CM

## 2015-05-02 ENCOUNTER — Ambulatory Visit (INDEPENDENT_AMBULATORY_CARE_PROVIDER_SITE_OTHER): Payer: PPO | Admitting: General Surgery

## 2015-05-02 ENCOUNTER — Encounter: Payer: Self-pay | Admitting: General Surgery

## 2015-05-02 VITALS — BP 148/64 | HR 78 | Resp 12 | Ht 61.0 in | Wt 147.0 lb

## 2015-05-02 DIAGNOSIS — R920 Mammographic microcalcification found on diagnostic imaging of breast: Secondary | ICD-10-CM

## 2015-05-02 NOTE — Patient Instructions (Signed)
Patient will be asked to return to the office in one year with a bilateral screening mammogram. 

## 2015-05-02 NOTE — Progress Notes (Signed)
Patient ID: LITSY EPTING, female   DOB: 1936/01/11, 79 y.o.   MRN: [id number]  Chief Complaint  Patient presents with  . Follow-up    mammogram    HPI Kristin Miller is a 79 y.o. female who presents for a breast evaluation. The most recent mammogram was done on 04/25/2015. The patient has been followed since April 2014 in regards to a small area of microcalcifications in the right breast. Patient does perform regular self breast checks and gets regular mammograms done.    HPI  Past Medical History  Diagnosis Date  . Osteoporosis   . Hypertension   . Back pain   . Arthritis   . Frequent headaches   . Collar bone fracture 05/28/2013    Fractured Collar bone on left    Past Surgical History  Procedure Laterality Date  . Abdominal hysterectomy  1970    Partial    Family History  Problem Relation Age of Onset  . Mental illness Mother     Dementia  . Stroke Father   . Cancer Brother     Lung Cancer    Social History Social History  Substance Use Topics  . Smoking status: Never Smoker   . Smokeless tobacco: Never Used  . Alcohol Use: No    No Known Allergies  Current Outpatient Prescriptions  Medication Sig Dispense Refill  . acetaminophen (TYLENOL) 500 MG tablet Take 500 mg by mouth every 6 (six) hours as needed for pain.    . calcium carbonate (OS-CAL - DOSED IN MG OF ELEMENTAL CALCIUM) 1250 MG tablet Take 1 tablet by mouth 3 (three) times daily with meals.     . cetirizine (ZYRTEC) 10 MG tablet Take 10 mg by mouth as needed.     Marland Kitchen lisinopril-hydrochlorothiazide (PRINZIDE,ZESTORETIC) 20-12.5 MG per tablet Take 1 tablet by mouth daily. 90 tablet 3  . Multiple Vitamin (MULTIVITAMIN) capsule Take 1 capsule by mouth daily.    . traMADol (ULTRAM) 50 MG tablet Take 50 mg by mouth every 6 (six) hours as needed.     No current facility-administered medications for this visit.    Review of Systems Review of Systems  Constitutional: Negative.   Respiratory:  Negative.   Cardiovascular: Negative.     Blood pressure 148/64, pulse 78, resp. rate 12, height 5\' 1"  (1.549 m), weight 147 lb (66.679 kg).  Physical Exam Physical Exam  Constitutional: She is oriented to person, place, and time. She appears well-developed and well-nourished.  Eyes: Conjunctivae are normal. No scleral icterus.  Neck: Neck supple.  Cardiovascular: Normal rate, regular rhythm and normal heart sounds.   Pulmonary/Chest: Effort normal and breath sounds normal. Right breast exhibits no inverted nipple, no mass, no nipple discharge, no skin change and no tenderness. Left breast exhibits no inverted nipple, no mass, no nipple discharge, no skin change and no tenderness.  Lymphadenopathy:    She has no cervical adenopathy.    She has no axillary adenopathy.  Neurological: She is alert and oriented to person, place, and time.  Skin: Skin is warm and dry.    Data Reviewed Bilateral mammograms dated 04/25/2015 were reviewed and compared to previous studies. Reported mild increase in coarseness of the 4 mm group of calcifications. 5-6 mm associated density. No increased area involvement. BI-RADS-4.  These films have been reviewed independently and compared to studies dating back prior to 2014. Interval change remains scant if any.  Assessment    Small mass with associated calcifications suspicious for degenerating  fibroadenoma versus early malignancy, scant interval change over 30 months.    Plan    Options for management reviewed: 1) stereotactic biopsy versus 2) continued observation. The patient is comfortable as an I with latter.    Patient will be asked to return to the office in one year with a bilateral screening mammogram. PCP:  Shelton Silvas, [place] Gleason 05/03/2015, 6:22 AM

## 2015-05-21 ENCOUNTER — Ambulatory Visit (INDEPENDENT_AMBULATORY_CARE_PROVIDER_SITE_OTHER): Payer: PPO | Admitting: Family Medicine

## 2015-05-21 ENCOUNTER — Encounter: Payer: Self-pay | Admitting: Family Medicine

## 2015-05-21 ENCOUNTER — Other Ambulatory Visit: Payer: Self-pay | Admitting: *Deleted

## 2015-05-21 VITALS — BP 140/84 | HR 80 | Ht 64.0 in | Wt 145.0 lb

## 2015-05-21 DIAGNOSIS — I10 Essential (primary) hypertension: Secondary | ICD-10-CM

## 2015-05-21 DIAGNOSIS — Z7189 Other specified counseling: Secondary | ICD-10-CM

## 2015-05-21 DIAGNOSIS — R920 Mammographic microcalcification found on diagnostic imaging of breast: Secondary | ICD-10-CM | POA: Diagnosis not present

## 2015-05-21 DIAGNOSIS — Z7689 Persons encountering health services in other specified circumstances: Secondary | ICD-10-CM

## 2015-05-21 NOTE — Progress Notes (Signed)
Name: Kristin Miller   MRN: [id number]    DOB: 1935/11/04   Date:05/21/2015       Progress Note  Subjective  Chief Complaint  Chief Complaint  Patient presents with  . Establish Care    HPI Comments: Patient to establish care for hypertension and general medical care.  Back concerns followed by [facility] and physical therapy.  Hypertension This is a chronic problem. The current episode started more than 1 year ago. The problem has been gradually improving since onset. The problem is controlled. Pertinent negatives include no anxiety, blurred vision, chest pain, headaches, malaise/fatigue, neck pain, orthopnea, palpitations, peripheral edema, PND, shortness of breath or sweats. Agents associated with hypertension include NSAIDs. There are no known risk factors for coronary artery disease. Past treatments include ACE inhibitors and diuretics. The current treatment provides moderate improvement. There are no compliance problems.  There is no history of angina, kidney disease, CAD/MI, CVA, heart failure, left ventricular hypertrophy, PVD, renovascular disease or retinopathy. There is no history of chronic renal disease, a hypertension causing med or sleep apnea.    No problem-specific assessment & plan notes found for this encounter.   Past Medical History  Diagnosis Date  . Osteoporosis   . Hypertension   . Back pain   . Arthritis   . Frequent headaches   . Collar bone fracture 05/28/2013    Fractured Collar bone on left  . Allergy     Past Surgical History  Procedure Laterality Date  . Abdominal hysterectomy  1970    Partial    Family History  Problem Relation Age of Onset  . Mental illness Mother     Dementia  . Stroke Father   . Cancer Brother     Lung Cancer    Social History   Social History  . Marital Status: Married    Spouse Name: N/A  . Number of Children: N/A  . Years of Education: N/A   Occupational History  . Not on file.   Social History Main Topics  .  Smoking status: Never Smoker   . Smokeless tobacco: Never Used  . Alcohol Use: No  . Drug Use: No  . Sexual Activity: Not Currently   Other Topics Concern  . Not on file   Social History Narrative    No Known Allergies   Review of Systems  Constitutional: Negative for fever, chills, weight loss and malaise/fatigue.  HENT: Negative for ear discharge, ear pain and sore throat.   Eyes: Negative for blurred vision.  Respiratory: Negative for cough, sputum production, shortness of breath and wheezing.   Cardiovascular: Negative for chest pain, palpitations, orthopnea, leg swelling and PND.  Gastrointestinal: Negative for heartburn, nausea, abdominal pain, diarrhea, constipation, blood in stool and melena.  Genitourinary: Negative for dysuria, urgency, frequency and hematuria.  Musculoskeletal: Positive for back pain. Negative for myalgias, joint pain and neck pain.  Skin: Negative for rash.  Neurological: Negative for dizziness, tingling, sensory change, focal weakness and headaches.  Endo/Heme/Allergies: Negative for environmental allergies and polydipsia. Does not bruise/bleed easily.  Psychiatric/Behavioral: Negative for depression and suicidal ideas. The patient is not nervous/anxious and does not have insomnia.      Objective  Filed Vitals:   05/21/15 0844  BP: 140/84  Pulse: 80  Height: 5\' 4"  (1.626 m)  Weight: 145 lb (65.772 kg)    Physical Exam  Constitutional: She is oriented to person, place, and time and well-developed, well-nourished, and in no distress. No distress.  HENT:  Head: Normocephalic and atraumatic.  Right Ear: External ear normal.  Left Ear: External ear normal.  Nose: Nose normal.  Mouth/Throat: Oropharynx is clear and moist.  Eyes: Conjunctivae, EOM and lids are normal. Pupils are equal, round, and reactive to light. Right eye exhibits no discharge. Left eye exhibits no discharge.  Cataracts noted  Neck: Normal range of motion. Neck supple. No  JVD present. No thyromegaly present.  Cardiovascular: Normal rate, regular rhythm, normal heart sounds and intact distal pulses.  Exam reveals no gallop and no friction rub.   No murmur heard. Pulmonary/Chest: Effort normal and breath sounds normal.  Abdominal: Soft. Bowel sounds are normal. She exhibits no mass. There is no tenderness. There is no guarding.  Musculoskeletal: Normal range of motion. She exhibits no edema.  Lymphadenopathy:    She has no cervical adenopathy.  Neurological: She is alert and oriented to person, place, and time. She has normal sensation, normal strength, normal reflexes and intact cranial nerves.  Skin: Skin is warm and dry. She is not diaphoretic.  Psychiatric: Mood and affect normal.  Nursing note and vitals reviewed.     Assessment & Plan  Problem List Items Addressed This Visit      Cardiovascular and Mediastinum   HTN (hypertension)     Other   Mammographic microcalcification   Relevant Orders   Ambulatory referral to General Surgery    Other Visit Diagnoses    Encounter to establish care    -  Primary         Dr. Otilio Miu [facility]  05/21/2015

## 2015-05-22 ENCOUNTER — Other Ambulatory Visit: Payer: Self-pay | Admitting: General Surgery

## 2015-05-22 DIAGNOSIS — R920 Mammographic microcalcification found on diagnostic imaging of breast: Secondary | ICD-10-CM

## 2015-05-28 ENCOUNTER — Ambulatory Visit
Admission: RE | Admit: 2015-05-28 | Discharge: 2015-05-28 | Disposition: A | Discharge status: Home / Self Care | Payer: PPO | Source: Ambulatory Visit | Attending: General Surgery | Admitting: General Surgery

## 2015-05-28 DIAGNOSIS — R920 Mammographic microcalcification found on diagnostic imaging of breast: Secondary | ICD-10-CM

## 2015-05-28 DIAGNOSIS — C50511 Malignant neoplasm of lower-outer quadrant of right female breast: Secondary | ICD-10-CM | POA: Diagnosis not present

## 2015-05-28 DIAGNOSIS — R928 Other abnormal and inconclusive findings on diagnostic imaging of breast: Secondary | ICD-10-CM | POA: Diagnosis present

## 2015-05-28 HISTORY — PX: BREAST BIOPSY: SHX20

## 2015-05-28 NOTE — Op Note (Signed)
Preoperative diagnosis: Right breast microcalcifications.  Postoperative diagnosis: Same.  Operative procedure: Right breast stereotactic biopsy.  Surgeon: Hervey Ard, M.D.  Anesthesia: 15 mL 1% Xylocaine with epinephrine.  Estimate blood loss: Minimal.  79 year old woman is had microcalcifications in the breast and recently decided to proceed to biopsy.  The patient was placed on the stereotactic table and appropriate pre-and post fire images obtained. 12 core samples were obtained. Calcifications of interest were removed. Specimen radiograph confirmed. A top hat clip was placed. Scant bleeding was noted. Skin defect closed with benzoin and Steri-Strips followed by water proved dressing.  The patient will be contacted when biopsy results are available.

## 2015-05-29 ENCOUNTER — Telehealth: Payer: Self-pay | Admitting: General Surgery

## 2015-05-29 NOTE — Telephone Encounter (Signed)
The patient called back for her results. She was notified that a very low-grade/early cancer was identified on her recently completed stereotactic biopsy. Arrangements will made for an office evaluation to discuss options for management.

## 2015-05-29 NOTE — Telephone Encounter (Signed)
Message left to call for results

## 2015-05-30 ENCOUNTER — Other Ambulatory Visit: Payer: Self-pay

## 2015-05-31 ENCOUNTER — Other Ambulatory Visit: Payer: PPO

## 2015-05-31 ENCOUNTER — Encounter: Payer: Self-pay | Admitting: General Surgery

## 2015-05-31 ENCOUNTER — Ambulatory Visit (INDEPENDENT_AMBULATORY_CARE_PROVIDER_SITE_OTHER): Payer: PPO | Admitting: General Surgery

## 2015-05-31 VITALS — BP 144/82 | HR 84 | Resp 14 | Ht 61.0 in | Wt 143.0 lb

## 2015-05-31 DIAGNOSIS — C50911 Malignant neoplasm of unspecified site of right female breast: Secondary | ICD-10-CM

## 2015-05-31 NOTE — Patient Instructions (Addendum)
The patient is aware to call back for any questions or concerns.  Patient's surgery has been scheduled for 06-12-15 at [facility].

## 2015-05-31 NOTE — Progress Notes (Signed)
Patient ID: Kristin Miller, female   DOB: [birth year], 79 y.o.   MRN: [id number]  Chief Complaint  Patient presents with  . Follow-up    discussion    HPI Kristin Miller is a 79 y.o. female. The patient underwent a stereotactic guided biopsy of the right breast on November 7 4 microcalcifications. She had been notified by phone that the biopsy showed evidence of early stage breast cancer. Here today with her husband for post right breast biopsy discussion.  The patient reports minimal discomfort from the biopsy procedure.   HPI  Past Medical History  Diagnosis Date  . Osteoporosis   . Hypertension   . Back pain   . Arthritis   . Frequent headaches   . Collar bone fracture 05/28/2013    Fractured Collar bone on left  . Allergy     Past Surgical History  Procedure Laterality Date  . Abdominal hysterectomy  1970    Partial  . Breast biopsy Right 05/28/2015    INVASIVE MAMMARY CARCINOMA     Family History  Problem Relation Age of Onset  . Mental illness Mother     Dementia  . Stroke Father   . Cancer Brother     Lung Cancer    Social History Social History  Substance Use Topics  . Smoking status: Never Smoker   . Smokeless tobacco: Never Used  . Alcohol Use: No    No Known Allergies  Current Outpatient Prescriptions  Medication Sig Dispense Refill  . acetaminophen (TYLENOL) 500 MG tablet Take 500 mg by mouth every 6 (six) hours as needed for pain.    . calcium carbonate (OS-CAL - DOSED IN MG OF ELEMENTAL CALCIUM) 1250 MG tablet Take 1 tablet by mouth 3 (three) times daily with meals.     . cetirizine (ZYRTEC) 10 MG tablet Take 10 mg by mouth as needed.     Marland Kitchen lisinopril-hydrochlorothiazide (PRINZIDE,ZESTORETIC) 20-12.5 MG per tablet Take 1 tablet by mouth daily. 90 tablet 3  . Multiple Vitamin (MULTIVITAMIN) capsule Take 1 capsule by mouth daily.    . traMADol (ULTRAM) 50 MG tablet Take 50 mg by mouth every 6 (six) hours as needed. Dr Onnie Graham     No current  facility-administered medications for this visit.    Review of Systems Review of Systems  Constitutional: Negative.   Respiratory: Negative.   Cardiovascular: Negative.     Blood pressure 144/82, pulse 84, resp. rate 14, height 5\' 1"  (1.549 m), weight 143 lb (64.864 kg).  Physical Exam Physical Exam  Constitutional: She is oriented to person, place, and time. She appears well-developed and well-nourished.  Cardiovascular: Normal rate and regular rhythm.   Pulmonary/Chest: Effort normal and breath sounds normal.  Neurological: She is alert and oriented to person, place, and time.  Skin: Skin is warm and dry.  Psychiatric: Her behavior is normal.    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 Histologic grade 1. 4 mm on biopsy. Receptor status pending.  Ultrasound examination of the right breast was completed. The biopsy cavity is not clearly indicated nor is the clip.  Assessment    Clinical stage I carcinoma of the right breast.    Plan    The patient's microcalcifications had been followed by myself for approximately 2.5 years. Minimal change had been noted. Subsequent biopsy showed a 4 mm invasive mammary carcinoma, T1a.  Options for management were reviewed: Mastectomy and breast conservation were presented as equivalent procedures. At this time,  the patient is most interested in breast conservation. Indications for post wide excision radiation therapy were reviewed. An dictation for sentinel node biopsy discussed.  Options for second surgical opinion, especially considering the delay in initial diagnosis, were reviewed.  As the area biopsy is not clearly identified on ultrasound, needle localization will be of benefit prior to the procedure.      Patient's surgery has been scheduled for 06-12-15 at [facility].  PCP:  Paulina Fusi 06/01/2015, 6:56 AM

## 2015-06-01 ENCOUNTER — Telehealth: Payer: Self-pay

## 2015-06-01 ENCOUNTER — Other Ambulatory Visit: Payer: Self-pay | Admitting: General Surgery

## 2015-06-01 ENCOUNTER — Ambulatory Visit: Payer: PPO

## 2015-06-01 DIAGNOSIS — C50911 Malignant neoplasm of unspecified site of right female breast: Secondary | ICD-10-CM | POA: Insufficient documentation

## 2015-06-01 NOTE — Telephone Encounter (Signed)
Patient notified to report to the [facility] at [facility] on 06/12/15 at 9:15 am for her Needle location and SLN injection prior to surgery. Patient is aware of date, time, and instructions.

## 2015-06-01 NOTE — H&P (Signed)
HPI  Kristin Miller is a 79 y.o. female. The patient underwent a stereotactic guided biopsy of the right breast on November 7 4 microcalcifications. She had been notified by phone that the biopsy showed evidence of early stage breast cancer. Here today with her husband for post right breast biopsy discussion.  The patient reports minimal discomfort from the biopsy procedure.  HPI  Past Medical History   Diagnosis  Date   .  Osteoporosis    .  Hypertension    .  Back pain    .  Arthritis    .  Frequent headaches    .  Collar bone fracture  05/28/2013     Fractured Collar bone on left   .  Allergy     Past Surgical History   Procedure  Laterality  Date   .  Abdominal hysterectomy   1970     Partial   .  Breast biopsy  Right  05/28/2015     INVASIVE MAMMARY CARCINOMA    Family History   Problem  Relation  Age of Onset   .  Mental illness  Mother      Dementia   .  Stroke  Father    .  Cancer  Brother      Lung Cancer    Social History  Social History   Substance Use Topics   .  Smoking status:  Never Smoker   .  Smokeless tobacco:  Never Used   .  Alcohol Use:  No    No Known Allergies  Current Outpatient Prescriptions   Medication  Sig  Dispense  Refill   .  acetaminophen (TYLENOL) 500 MG tablet  Take 500 mg by mouth every 6 (six) hours as needed for pain.     .  calcium carbonate (OS-CAL - DOSED IN MG OF ELEMENTAL CALCIUM) 1250 MG tablet  Take 1 tablet by mouth 3 (three) times daily with meals.     .  cetirizine (ZYRTEC) 10 MG tablet  Take 10 mg by mouth as needed.     Marland Kitchen  lisinopril-hydrochlorothiazide (PRINZIDE,ZESTORETIC) 20-12.5 MG per tablet  Take 1 tablet by mouth daily.  90 tablet  3   .  Multiple Vitamin (MULTIVITAMIN) capsule  Take 1 capsule by mouth daily.     .  traMADol (ULTRAM) 50 MG tablet  Take 50 mg by mouth every 6 (six) hours as needed. Dr Onnie Graham      No current facility-administered medications for this visit.    Review of Systems  Review of Systems   Constitutional: Negative.  Respiratory: Negative.  Cardiovascular: Negative.   Blood pressure 144/82, pulse 84, resp. rate 14, height 5\' 1"  ([id number] m), weight 143 lb (64.864 kg).  Physical Exam  Physical Exam  Constitutional: She is oriented to person, place, and time. She appears well-developed and well-nourished.  Cardiovascular: Normal rate and regular rhythm.  Pulmonary/Chest: Effort normal and breath sounds normal.  Neurological: She is alert and oriented to person, place, and time.  Skin: Skin is warm and dry.  Psychiatric: Her behavior is normal.   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 Histologic grade 1. 4 mm on biopsy. Receptor status pending.  Ultrasound examination of the right breast was completed. The biopsy cavity is not clearly indicated nor is the clip.  Assessment   Clinical stage I carcinoma of the right breast.   Plan   The patient's microcalcifications had been followed by myself  for approximately 2.5 years. Minimal change had been noted. Subsequent biopsy showed a 4 mm invasive mammary carcinoma, T1a.  Options for management were reviewed: Mastectomy and breast conservation were presented as equivalent procedures. At this time, the patient is most interested in breast conservation. Indications for post wide excision radiation therapy were reviewed. An dictation for sentinel node biopsy discussed.  Options for second surgical opinion, especially considering the delay in initial diagnosis, were reviewed.  As the area biopsy is not clearly identified on ultrasound, needle localization will be of benefit prior to the procedure.   Patient's surgery has been scheduled for 06-12-15 at [facility].  PCP: Paulina Fusi  06/01/2015, 6:56 AM

## 2015-06-01 NOTE — Telephone Encounter (Signed)
  Oncology Nurse Navigator Documentation    Navigator Encounter Type: Introductory phone call (06/01/15 1100) Patient Visit Type: Initial (06/01/15 1100) Treatment Phase:  (Pre-surgery) (06/01/15 1100) Barriers/Navigation Needs: Education (06/01/15 1100) Education: Newly Diagnosed Cancer Education (06/01/15 1100) Interventions: Coordination of Care (06/01/15 1100)   Coordination of Care: Other (Pre-admit testing) (06/01/15 1100)        Time Spent with Patient: 30 (06/01/15 1100)  Phoned patient to introduce Navigation service.  Coordinated Pre-admit testing because patient has trip planned and will not be home on day scheduled.  Pre-admit testing to call patient on 06/06/15 between 9a-1p. on her cell phone.    Risk assessment performed for Breast case Conference.  Fed-Exed Breast Cancer Treatment Handbook/folder with hospital services per patient request.

## 2015-06-04 ENCOUNTER — Ambulatory Visit: Payer: PPO

## 2015-06-06 ENCOUNTER — Other Ambulatory Visit: Payer: PPO

## 2015-06-06 ENCOUNTER — Encounter: Payer: Self-pay | Admitting: *Deleted

## 2015-06-06 NOTE — Patient Instructions (Signed)
  Your procedure is scheduled on: 06-12-15 (TUESDAY) Report to [place]   Remember: Instructions that are not followed completely may result in serious medical risk, up to and including death, or upon the discretion of your surgeon and anesthesiologist your surgery may need to be rescheduled.    _X___ 1. Do not eat food or drink liquids after midnight. No gum chewing or hard candies.     _X___ 2. No Alcohol for 24 hours before or after surgery.   ____ 3. Bring all medications with you on the day of surgery if instructed.    _X___ 4. Notify your doctor if there is any change in your medical condition     (cold, fever, infections).     Do not wear jewelry, make-up, hairpins, clips or nail polish.  Do not wear lotions, powders, or perfumes. You may wear deodorant.  Do not shave 48 hours prior to surgery. Men may shave face and neck.  Do not bring valuables to the hospital.    [facility] is not responsible for any belongings or valuables.               Contacts, dentures or bridgework may not be worn into surgery.  Leave your suitcase in the car. After surgery it may be brought to your room.  For patients admitted to the hospital, discharge time is determined by your treatment team.   Patients discharged the day of surgery will not be allowed to drive home.   Please read over the following fact sheets that you were given:      ____ Take these medicines the morning of surgery with A SIP OF WATER:    1.NONE  2.   3.   4.  5.  6.  ____ Fleet Enema (as directed)   _X___ Use CHG Soap as directed  ____ Use inhalers on the day of surgery  ____ Stop metformin 2 days prior to surgery    ____ Take 1/2 of usual insulin dose the night before surgery and none on the morning of surgery.   ____ Stop Coumadin/Plavix/aspirin-N/A  ____ Stop Anti-inflammatories-TYLENOL OK TO CONTINUE   ____ Stop supplements until after surgery.    ____ Bring C-Pap to the hospital.

## 2015-06-08 ENCOUNTER — Encounter
Admission: RE | Admit: 2015-06-08 | Discharge: 2015-06-08 | Disposition: A | Discharge status: Home / Self Care | Payer: PPO | Source: Ambulatory Visit | Attending: Anesthesiology | Admitting: Anesthesiology

## 2015-06-08 DIAGNOSIS — Z01810 Encounter for preprocedural cardiovascular examination: Secondary | ICD-10-CM | POA: Insufficient documentation

## 2015-06-08 DIAGNOSIS — Z01812 Encounter for preprocedural laboratory examination: Secondary | ICD-10-CM | POA: Diagnosis present

## 2015-06-08 LAB — POTASSIUM: POTASSIUM: 4.1 mmol/L (ref 3.5–5.1)

## 2015-06-08 LAB — SURGICAL PATHOLOGY

## 2015-06-11 ENCOUNTER — Other Ambulatory Visit: Payer: Self-pay | Admitting: General Surgery

## 2015-06-12 ENCOUNTER — Encounter
Admission: RE | Admit: 2015-06-12 | Discharge: 2015-06-12 | Disposition: A | Discharge status: Home / Self Care | Payer: PPO | Source: Ambulatory Visit | Attending: General Surgery | Admitting: General Surgery

## 2015-06-12 ENCOUNTER — Ambulatory Visit
Admission: RE | Admit: 2015-06-12 | Discharge: 2015-06-12 | Disposition: A | Discharge status: Home / Self Care | Payer: PPO | Source: Ambulatory Visit | Attending: General Surgery | Admitting: General Surgery

## 2015-06-12 ENCOUNTER — Encounter: Payer: Self-pay | Admitting: *Deleted

## 2015-06-12 ENCOUNTER — Ambulatory Visit: Payer: PPO | Admitting: Anesthesiology

## 2015-06-12 ENCOUNTER — Encounter: Payer: Self-pay | Admitting: Anesthesiology

## 2015-06-12 ENCOUNTER — Encounter
Admission: RE | Disposition: A | Discharge status: Home / Self Care | Payer: Self-pay | Source: Ambulatory Visit | Attending: General Surgery

## 2015-06-12 ENCOUNTER — Ambulatory Visit: Payer: PPO

## 2015-06-12 DIAGNOSIS — Z170 Estrogen receptor positive status [ER+]: Secondary | ICD-10-CM | POA: Diagnosis not present

## 2015-06-12 DIAGNOSIS — R229 Localized swelling, mass and lump, unspecified: Secondary | ICD-10-CM

## 2015-06-12 DIAGNOSIS — C50911 Malignant neoplasm of unspecified site of right female breast: Secondary | ICD-10-CM

## 2015-06-12 DIAGNOSIS — Z79899 Other long term (current) drug therapy: Secondary | ICD-10-CM | POA: Insufficient documentation

## 2015-06-12 DIAGNOSIS — C50411 Malignant neoplasm of upper-outer quadrant of right female breast: Secondary | ICD-10-CM

## 2015-06-12 DIAGNOSIS — Z801 Family history of malignant neoplasm of trachea, bronchus and lung: Secondary | ICD-10-CM | POA: Insufficient documentation

## 2015-06-12 DIAGNOSIS — Z90710 Acquired absence of both cervix and uterus: Secondary | ICD-10-CM | POA: Diagnosis not present

## 2015-06-12 DIAGNOSIS — M810 Age-related osteoporosis without current pathological fracture: Secondary | ICD-10-CM | POA: Diagnosis not present

## 2015-06-12 DIAGNOSIS — IMO0002 Reserved for concepts with insufficient information to code with codable children: Secondary | ICD-10-CM

## 2015-06-12 DIAGNOSIS — I10 Essential (primary) hypertension: Secondary | ICD-10-CM | POA: Diagnosis not present

## 2015-06-12 DIAGNOSIS — C50511 Malignant neoplasm of lower-outer quadrant of right female breast: Secondary | ICD-10-CM | POA: Diagnosis not present

## 2015-06-12 DIAGNOSIS — M549 Dorsalgia, unspecified: Secondary | ICD-10-CM | POA: Diagnosis not present

## 2015-06-12 DIAGNOSIS — Z818 Family history of other mental and behavioral disorders: Secondary | ICD-10-CM | POA: Diagnosis not present

## 2015-06-12 DIAGNOSIS — Z823 Family history of stroke: Secondary | ICD-10-CM | POA: Insufficient documentation

## 2015-06-12 DIAGNOSIS — M1990 Unspecified osteoarthritis, unspecified site: Secondary | ICD-10-CM | POA: Diagnosis not present

## 2015-06-12 HISTORY — DX: Anemia, unspecified: D64.9

## 2015-06-12 HISTORY — PX: BREAST LUMPECTOMY WITH AXILLARY LYMPH NODE DISSECTION: SHX5756

## 2015-06-12 HISTORY — DX: Cardiac arrhythmia, unspecified: I49.9

## 2015-06-12 HISTORY — DX: Malignant neoplasm of upper-outer quadrant of right female breast: C50.411

## 2015-06-12 HISTORY — PX: BREAST BIOPSY: SHX20

## 2015-06-12 SURGERY — BREAST LUMPECTOMY WITH AXILLARY LYMPH NODE DISSECTION
Anesthesia: General | Site: Breast | Laterality: Right | Wound class: Clean

## 2015-06-12 MED ORDER — DEXAMETHASONE SODIUM PHOSPHATE 4 MG/ML IJ SOLN
INTRAMUSCULAR | Status: DC | PRN
  Administered 2015-06-12: 8 mg via INTRAVENOUS

## 2015-06-12 MED ORDER — BUPIVACAINE-EPINEPHRINE (PF) 0.5% -1:200000 IJ SOLN
INTRAMUSCULAR | Status: DC | PRN
  Administered 2015-06-12: 30 mL via PERINEURAL

## 2015-06-12 MED ORDER — TECHNETIUM TC 99M SULFUR COLLOID
0.9310 | Freq: Once | INTRAVENOUS | Status: DC | PRN
  Administered 2015-06-12: 0.931 via INTRAVENOUS
  Filled 2015-06-12: qty 0.93

## 2015-06-12 MED ORDER — KETOROLAC TROMETHAMINE 30 MG/ML IJ SOLN
INTRAMUSCULAR | Status: DC | PRN
  Administered 2015-06-12: 15 mg via INTRAVENOUS

## 2015-06-12 MED ORDER — FENTANYL CITRATE (PF) 100 MCG/2ML IJ SOLN
INTRAMUSCULAR | Status: DC | PRN
  Administered 2015-06-12 (×2): 25 ug via INTRAVENOUS
  Administered 2015-06-12: 50 ug via INTRAVENOUS

## 2015-06-12 MED ORDER — PHENYLEPHRINE HCL 10 MG/ML IJ SOLN
INTRAMUSCULAR | Status: DC | PRN
  Administered 2015-06-12: 100 ug via INTRAVENOUS
  Administered 2015-06-12: 50 ug via INTRAVENOUS
  Administered 2015-06-12 (×3): 100 ug via INTRAVENOUS

## 2015-06-12 MED ORDER — ACETAMINOPHEN 10 MG/ML IV SOLN
INTRAVENOUS | Status: AC
  Filled 2015-06-12: qty 100

## 2015-06-12 MED ORDER — FAMOTIDINE 20 MG PO TABS
20.0000 mg | ORAL_TABLET | Freq: Once | ORAL | Status: AC
  Administered 2015-06-12: 20 mg via ORAL

## 2015-06-12 MED ORDER — LIDOCAINE HCL (CARDIAC) 20 MG/ML IV SOLN
INTRAVENOUS | Status: DC | PRN
  Administered 2015-06-12: 100 mg via INTRAVENOUS

## 2015-06-12 MED ORDER — FENTANYL CITRATE (PF) 100 MCG/2ML IJ SOLN: 25.0000 ug | INTRAMUSCULAR | Status: DC | PRN

## 2015-06-12 MED ORDER — LACTATED RINGERS IV SOLN
INTRAVENOUS | Status: DC
  Administered 2015-06-12: 11:00:00 via INTRAVENOUS

## 2015-06-12 MED ORDER — SODIUM CHLORIDE 0.9 % IJ SOLN
INTRAMUSCULAR | Status: DC | PRN
  Administered 2015-06-12: 4 mL

## 2015-06-12 MED ORDER — ONDANSETRON HCL 4 MG/2ML IJ SOLN: 4.0000 mg | Freq: Once | INTRAMUSCULAR | Status: DC | PRN

## 2015-06-12 MED ORDER — ONDANSETRON HCL 4 MG/2ML IJ SOLN
INTRAMUSCULAR | Status: DC | PRN
  Administered 2015-06-12: 4 mg via INTRAVENOUS

## 2015-06-12 MED ORDER — BUPIVACAINE-EPINEPHRINE (PF) 0.5% -1:200000 IJ SOLN
INTRAMUSCULAR | Status: AC
  Filled 2015-06-12: qty 30

## 2015-06-12 MED ORDER — PROPOFOL 10 MG/ML IV BOLUS
INTRAVENOUS | Status: DC | PRN
  Administered 2015-06-12: 150 mg via INTRAVENOUS

## 2015-06-12 MED ORDER — MIDAZOLAM HCL 2 MG/2ML IJ SOLN
INTRAMUSCULAR | Status: DC | PRN
  Administered 2015-06-12: 2 mg via INTRAVENOUS

## 2015-06-12 MED ORDER — METHYLENE BLUE 1 % INJ SOLN
INTRAMUSCULAR | Status: AC
  Filled 2015-06-12: qty 10

## 2015-06-12 MED ORDER — TRAMADOL HCL 50 MG PO TABS: 50.0000 mg | ORAL_TABLET | ORAL | Status: DC | PRN

## 2015-06-12 MED ORDER — FAMOTIDINE 20 MG PO TABS
ORAL_TABLET | ORAL | Status: AC
  Administered 2015-06-12: 20 mg via ORAL
  Filled 2015-06-12: qty 1

## 2015-06-12 MED ORDER — ACETAMINOPHEN 10 MG/ML IV SOLN
INTRAVENOUS | Status: DC | PRN
Start: 2015-06-12 — End: 2015-06-12
  Administered 2015-06-12: 1000 mg via INTRAVENOUS

## 2015-06-12 MED ORDER — SODIUM CHLORIDE 0.9 % IJ SOLN
INTRAMUSCULAR | Status: AC
  Filled 2015-06-12: qty 10

## 2015-06-12 SURGICAL SUPPLY — 50 items
APPLIER CLIP 11 MED OPEN (CLIP)
BANDAGE ELASTIC 6 LF NS (GAUZE/BANDAGES/DRESSINGS) ×2 IMPLANT
BLADE SURG 15 STRL SS SAFETY (BLADE) ×4 IMPLANT
BNDG GAUZE 4.5X4.1 6PLY STRL (MISCELLANEOUS) ×2 IMPLANT
BULB RESERV EVAC DRAIN JP 100C (MISCELLANEOUS) IMPLANT
CANISTER SUCT 1200ML W/VALVE (MISCELLANEOUS) ×2 IMPLANT
CHLORAPREP W/TINT 26ML (MISCELLANEOUS) ×2 IMPLANT
CLIP APPLIE 11 MED OPEN (CLIP) IMPLANT
CNTNR SPEC 2.5X3XGRAD LEK (MISCELLANEOUS)
CONT SPEC 4OZ STER OR WHT (MISCELLANEOUS)
CONTAINER SPEC 2.5X3XGRAD LEK (MISCELLANEOUS) IMPLANT
COVER PROBE FLX POLY STRL (MISCELLANEOUS) ×2 IMPLANT
DEVICE DUBIN SPECIMEN MAMMOGRA (MISCELLANEOUS) ×2 IMPLANT
DRAIN CHANNEL JP 15F RND 16 (MISCELLANEOUS) IMPLANT
DRAPE LAPAROTOMY 100X77 ABD (DRAPES) ×2 IMPLANT
DRAPE LAPAROTOMY TRNSV 106X77 (MISCELLANEOUS) ×2 IMPLANT
DRESSING TELFA 4X3 1S ST N-ADH (GAUZE/BANDAGES/DRESSINGS) ×4 IMPLANT
DRSG TELFA 3X8 NADH (GAUZE/BANDAGES/DRESSINGS) ×2 IMPLANT
ELECT CAUTERY BLADE TIP 2.5 (TIP) ×2
ELECTRODE CAUTERY BLDE TIP 2.5 (TIP) ×1 IMPLANT
GAUZE FLUFF 18X24 1PLY STRL (GAUZE/BANDAGES/DRESSINGS) IMPLANT
GLOVE BIO SURGEON STRL SZ7.5 (GLOVE) ×2 IMPLANT
GLOVE INDICATOR 8.0 STRL GRN (GLOVE) ×2 IMPLANT
GOWN STRL REUS W/ TWL LRG LVL3 (GOWN DISPOSABLE) ×2 IMPLANT
GOWN STRL REUS W/TWL LRG LVL3 (GOWN DISPOSABLE) ×2
HARMONIC SCALPEL FOCUS (MISCELLANEOUS) IMPLANT
KIT RM TURNOVER STRD PROC AR (KITS) ×2 IMPLANT
LABEL OR SOLS (LABEL) ×2 IMPLANT
MARGIN MAP 10MM (MISCELLANEOUS) ×2 IMPLANT
NDL SAFETY 22GX1.5 (NEEDLE) ×2 IMPLANT
NEEDLE HYPO 25X1 1.5 SAFETY (NEEDLE) ×2 IMPLANT
PACK BASIN MINOR ARMC (MISCELLANEOUS) ×2 IMPLANT
PAD GROUND ADULT SPLIT (MISCELLANEOUS) ×2 IMPLANT
PIN SAFETY STRL (MISCELLANEOUS) IMPLANT
SLEVE PROBE SENORX GAMMA FIND (MISCELLANEOUS) ×2 IMPLANT
STRIP CLOSURE SKIN 1/2X4 (GAUZE/BANDAGES/DRESSINGS) ×2 IMPLANT
SUT ETHILON 3-0 FS-10 30 BLK (SUTURE) ×2
SUT SILK 2 0 (SUTURE) ×1
SUT SILK 2-0 18XBRD TIE 12 (SUTURE) ×1 IMPLANT
SUT SILK 3 0 (SUTURE) ×1
SUT SILK 3-0 18XBRD TIE 12 (SUTURE) ×1 IMPLANT
SUT VIC AB 2-0 CT1 (SUTURE) ×4 IMPLANT
SUT VIC AB 2-0 CT1 27 (SUTURE) ×1
SUT VIC AB 2-0 CT1 TAPERPNT 27 (SUTURE) ×1 IMPLANT
SUT VIC AB 4-0 FS2 27 (SUTURE) ×4 IMPLANT
SUTURE EHLN 3-0 FS-10 30 BLK (SUTURE) ×1 IMPLANT
SWABSTK COMLB BENZOIN TINCTURE (MISCELLANEOUS) ×2 IMPLANT
SYR CONTROL 10ML (SYRINGE) ×2 IMPLANT
TAPE TRANSPORE STRL 2 31045 (GAUZE/BANDAGES/DRESSINGS) ×2 IMPLANT
WATER STERILE IRR 1000ML POUR (IV SOLUTION) ×2 IMPLANT

## 2015-06-12 NOTE — Transfer of Care (Signed)
Immediate Anesthesia Transfer of Care Note  Patient: Kristin Miller  Procedure(s) Performed: Procedure(s): BREAST LUMPECTOMY WITH AXILLARY LYMPH NODE DISSECTION (Right) BREAST BIOPSY WITH NEEDLE LOCALIZATION (Right)  Patient Location: PACU  Anesthesia Type:General  Level of Consciousness: awake and alert   Airway & Oxygen Therapy: Patient Spontanous Breathing and Patient connected to nasal cannula oxygen  Post-op Assessment: Report given to RN and Post -op Vital signs reviewed and stable  Post vital signs: Reviewed and stable  Last Vitals:  Filed Vitals:   06/12/15 1004 06/12/15 1221  BP: 146/75 129/56  Pulse: 71   Temp: 37 C 37.1 C  Resp: 14 20    Complications: No apparent anesthesia complications

## 2015-06-12 NOTE — Op Note (Signed)
Reoperative diagnosis: Invasive mammary carcinoma the right breast.  Postoperative diagnosis: Same.  Operative procedure right breast wide excision, mastoplasty, sentinel node biopsy.  Operating surgeon: Hervey Ard, M.D.  Anesthesia: Gen. by LMA, Marcaine 0.5% with 1-200,000 epinephrine, 30 mL local infiltration.  Estimated blood loss: 5 mL.  Clinical note this 79 year old woman recently underwent a stereotactic biopsy for an area of microcalcifications with the identification of low-grade ER positive, PR negative, HER-2 negative invasive mammary carcinoma. She desired breast conservation. She underwent needle localization prior to the procedure as the biopsy cavity was not evident on ultrasound. She was injected with technetium sulfur colloid prior to procedure. After the induction of anesthesia 4 mL of saline/methylene blue diluted to 1 was injected in the subareolar plexus. She had pneumatic compression stockings for DVT prevention.  Operative note: With the patient under adequate general anesthesia breast was carefully prepped and draped. Ultrasound used to confirm location of the thick portion of the wire in the near retroareolar area at the 8:00 position. A radial incision was made from the edge of the areola to the periphery of the breast. The skin was incised sharply and electrocautery. The wire was brought into the operative field and the dissection carried down into the retroareolar tissue at the level of the prepectoralis fascia to remove the entire wire. Specimen radiograph confirmed the wire and the clip in the center of the specimen. Pectoralis fashion was taken with the specimen due to its deep location, greater than 2 cm from the skin. Was used and it was a possible to identify an area of increased uptake in the lower aspects of the axilla through the wide excision site. A single hot, non-blue lymph node was examined identified and removed. This was sent in formalin for routine  histology. No other lymph nodes were appreciated. The breast parenchyma was elevated off the underlying pectoralis muscle with cautery for 5 cm circumferentially. This was an approximately with interrupted 2-0 Vicryl figure-of-eight sutures. The adipose layer was approximated at the 1.5 cm level in similar fashion with mobilization with cautery as needed to provide for a smooth margin. The skin was closed with a running 4-0 Vicryl septic suture. Benzoin and Steri-Strips followed by Telfa, fluffed gauze, Kerlix and a strap was applied.  The patient tolerated the procedure well and was taken recovery in stable condition.

## 2015-06-12 NOTE — Anesthesia Procedure Notes (Signed)
Procedure Name: LMA Insertion Date/Time: 06/12/2015 11:09 AM Performed by: Johnna Acosta Pre-anesthesia Checklist: Patient identified, Emergency Drugs available, Suction available and Patient being monitored Patient Re-evaluated:Patient Re-evaluated prior to inductionOxygen Delivery Method: Circle system utilized Preoxygenation: Pre-oxygenation with 100% oxygen Intubation Type: IV induction Ventilation: Mask ventilation without difficulty LMA: LMA inserted LMA Size: 3.5 Tube type: Oral Number of attempts: 1 Placement Confirmation: positive ETCO2 Tube secured with: Tape Dental Injury: Teeth and Oropharynx as per pre-operative assessment

## 2015-06-12 NOTE — H&P (Signed)
No change in clinical condition.  Tolerated needle loc and SLN injection well. For wide excision, SLN biopsy.

## 2015-06-12 NOTE — Progress Notes (Signed)
Met patient ,and family while Dr. Bary Castilla in pre-op.    Oncology Nurse Navigator Documentation    Navigator Encounter Type: Treatment (06/12/15 1100) Patient Visit Type: Surgery (06/12/15 1100)     Education: Preparing for Upcoming Surgery/ Treatment (06/12/15 1100) Interventions: None required (06/12/15 1100)            Time Spent with Patient: 15 (06/12/15 1100)

## 2015-06-12 NOTE — Anesthesia Preprocedure Evaluation (Signed)
Anesthesia Evaluation  Patient identified by MRN, date of birth, ID band Patient awake    Reviewed: Allergy & Precautions, H&P , NPO status , Patient's Chart, lab work & pertinent test results, reviewed documented beta blocker date and time   History of Anesthesia Complications Negative for: history of anesthetic complications  Airway Mallampati: III  TM Distance: >3 FB Neck ROM: full    Dental no notable dental hx. (+) Edentulous Upper, Partial Lower, Missing   Pulmonary neg pulmonary ROS,    Pulmonary exam normal breath sounds clear to auscultation       Cardiovascular Exercise Tolerance: Good hypertension, On Medications (-) angina(-) CAD, (-) Past MI, (-) Cardiac Stents and (-) CABG Normal cardiovascular exam+ dysrhythmias (palpitations in early 60s, none since) (-) Valvular Problems/Murmurs Rhythm:regular Rate:Normal     Neuro/Psych negative neurological ROS  negative psych ROS   GI/Hepatic negative GI ROS, Neg liver ROS,   Endo/Other  negative endocrine ROS  Renal/GU negative Renal ROS  negative genitourinary   Musculoskeletal   Abdominal   Peds  Hematology negative hematology ROS (+)   Anesthesia Other Findings Past Medical History:   Osteoporosis                                                 Hypertension                                                 Back pain                                                    Arthritis                                                    Collar bone fracture                            05/28/2013      Comment:Fractured Collar bone on left   Allergy                                                      Dysrhythmia                                                    Comment:h/o heart skipping beat years ago   Frequent headaches  Comment:h/o migraines   Cancer (HCC)                                                   Comment:BREAST   Anemia                                                         Comment:H/O AS A CHILD   Reproductive/Obstetrics negative OB ROS                             Anesthesia Physical Anesthesia Plan  ASA: II  Anesthesia Plan: General   Post-op Pain Management:    Induction:   Airway Management Planned:   Additional Equipment:   Intra-op Plan:   Post-operative Plan:   Informed Consent: I have reviewed the patients History and Physical, chart, labs and discussed the procedure including the risks, benefits and alternatives for the proposed anesthesia with the patient or authorized representative who has indicated his/her understanding and acceptance.   Dental Advisory Given  Plan Discussed with: Anesthesiologist, CRNA and Surgeon  Anesthesia Plan Comments:         Anesthesia Quick Evaluation

## 2015-06-13 ENCOUNTER — Telehealth: Payer: Self-pay | Admitting: General Surgery

## 2015-06-13 ENCOUNTER — Telehealth: Payer: Self-pay

## 2015-06-13 LAB — SURGICAL PATHOLOGY

## 2015-06-13 NOTE — Anesthesia Postprocedure Evaluation (Signed)
Anesthesia Post Note  Patient: Kristin Miller  Procedure(s) Performed: Procedure(s) (LRB): BREAST LUMPECTOMY WITH AXILLARY LYMPH NODE DISSECTION (Right) BREAST BIOPSY WITH NEEDLE LOCALIZATION (Right)  Patient location during evaluation: PACU Anesthesia Type: General Level of consciousness: awake and alert Pain management: pain level controlled Vital Signs Assessment: post-procedure vital signs reviewed and stable Respiratory status: spontaneous breathing, nonlabored ventilation, respiratory function stable and patient connected to nasal cannula oxygen Cardiovascular status: blood pressure returned to baseline and stable Postop Assessment: No signs of nausea or vomiting Anesthetic complications: no    Last Vitals:  Filed Vitals:   06/12/15 1322 06/12/15 1418  BP: 138/56 124/65  Pulse: 70 79  Temp: 36.7 C   Resp: 16 16    Last Pain:  Filed Vitals:   06/13/15 0759  PainSc: 0-No pain                 Martha Clan

## 2015-06-13 NOTE — Telephone Encounter (Signed)
  Oncology Nurse Navigator Documentation    Navigator Encounter Type: Telephone (Post-op) (06/13/15 1100) Patient Visit Type: Follow-up (06/13/15 1100)                    Time Spent with Patient: 15 (06/13/15 1100)   Patient states she is doing well post-op "because I had such good care".  She is scheduled to follow-up with Dr. Bary Castilla on 06/20/15, and will call navigator with further treatment planned by Dr. Bary Castilla .

## 2015-06-13 NOTE — Telephone Encounter (Signed)
The patient was notified the final path showed no residual tumor, SLN negative. ER+, PR -, Her 2 neu not amplified by FISH.  Reports doing well. One pain pill since surgery. Plan:F/U next week as planned.

## 2015-06-20 ENCOUNTER — Ambulatory Visit: Payer: PPO | Admitting: General Surgery

## 2015-06-20 ENCOUNTER — Encounter: Payer: Self-pay | Admitting: General Surgery

## 2015-06-20 ENCOUNTER — Ambulatory Visit (INDEPENDENT_AMBULATORY_CARE_PROVIDER_SITE_OTHER): Payer: PPO | Admitting: General Surgery

## 2015-06-20 VITALS — BP 158/70 | HR 86 | Resp 14 | Ht 64.0 in | Wt 143.0 lb

## 2015-06-20 DIAGNOSIS — C50511 Malignant neoplasm of lower-outer quadrant of right female breast: Secondary | ICD-10-CM | POA: Insufficient documentation

## 2015-06-20 NOTE — Progress Notes (Signed)
Patient ID: Kristin Miller, female   DOB: [birth year], 79 y.o.   MRN: [id number]  Chief Complaint  Patient presents with  . Routine Post Op    right lumpectomy    HPI Kristin Miller is a 79 y.o. female here today for her post op right lumpectomy done 06/12/15. Patient states she is doing well.   I personally reviewed the history. HPI  Past Medical History  Diagnosis Date  . Osteoporosis   . Hypertension   . Back pain   . Arthritis   . Collar bone fracture 05/28/2013    Fractured Collar bone on left  . Allergy   . Dysrhythmia     h/o heart skipping beat years ago  . Frequent headaches     h/o migraines  . Cancer (HCC)     BREAST  . Anemia     H/O AS A CHILD    Past Surgical History  Procedure Laterality Date  . Abdominal hysterectomy  1970    Partial  . Colonoscopy    . Breast biopsy Right 05/28/2015    INVASIVE MAMMARY CARCINOMA   . Breast excisional biopsy Right 06/12/2015    lumpectomy  . Breast lumpectomy with axillary lymph node dissection Right 06/12/2015    Procedure: BREAST LUMPECTOMY WITH AXILLARY LYMPH NODE DISSECTION;  Surgeon: Robert Bellow, MD;  Location: [facility] ORS;  Service: General;  Laterality: Right;  . Breast biopsy Right 06/12/2015    Procedure: BREAST BIOPSY WITH NEEDLE LOCALIZATION;  Surgeon: Robert Bellow, MD;  Location: [facility] ORS;  Service: General;  Laterality: Right;    Family History  Problem Relation Age of Onset  . Mental illness Mother     Dementia  . Stroke Father   . Cancer Brother     Lung Cancer    Social History Social History  Substance Use Topics  . Smoking status: Never Smoker   . Smokeless tobacco: Never Used  . Alcohol Use: No    Allergies  Allergen Reactions  . Other Rash    SUTURES-(PARTIAL HYSTERECTOMY)    Current Outpatient Prescriptions  Medication Sig Dispense Refill  . acetaminophen (TYLENOL) 500 MG tablet Take 500 mg by mouth every 6 (six) hours as needed for pain.    . calcium carbonate  (OS-CAL - DOSED IN MG OF ELEMENTAL CALCIUM) 1250 MG tablet Take 1 tablet by mouth 3 (three) times daily with meals.     . calcium-vitamin D (OSCAL WITH D) 500-200 MG-UNIT tablet Take 1 tablet by mouth 3 (three) times daily.    . cetirizine (ZYRTEC) 10 MG tablet Take 10 mg by mouth as needed.     Marland Kitchen lisinopril-hydrochlorothiazide (PRINZIDE,ZESTORETIC) 20-12.5 MG per tablet Take 1 tablet by mouth daily. (Patient taking differently: Take 1 tablet by mouth every morning. ) 90 tablet 3  . Multiple Vitamin (MULTIVITAMIN) capsule Take 1 capsule by mouth daily.    . traMADol (ULTRAM) 50 MG tablet Take 1 tablet (50 mg total) by mouth every 4 (four) hours as needed for moderate pain. 30 tablet 0   No current facility-administered medications for this visit.    Review of Systems Review of Systems  Constitutional: Negative.   Respiratory: Negative.   Cardiovascular: Negative.     Blood pressure 158/70, pulse 86, resp. rate 14, height '5\' 4"'  (1.626 m), weight 143 lb (64.864 kg).  Physical Exam Physical Exam  Constitutional: She is oriented to person, place, and time. She appears well-developed and well-nourished.  HENT:  Mouth/Throat: Oropharynx is  clear and moist.  Eyes: Conjunctivae are normal. No scleral icterus.  Pulmonary/Chest:    incision healing well right breast.  Neurological: She is alert and oriented to person, place, and time.  Skin: Skin is warm and dry.  Psychiatric: Her behavior is normal.    Data Reviewed 4 mm, T1a, N0 ER positive, PR negative, HER-2/neu negative by fish.  Assessment    Doing well status post wide excision and sentinel node biopsy.    Plan    The patient is still up in the air about formal radiation oncology assessment. We will defer until follow-up visit.  Standard of care with whole breast/accelerated partial breast radiation reviewed. Role of antiestrogen therapy discussed.    Follow up in 3 weeks.  PCP:  Fraser Din 06/20/2015, 4:53 PM

## 2015-06-20 NOTE — Patient Instructions (Signed)
The patient is aware to call back for any questions or concerns.  

## 2015-07-10 ENCOUNTER — Encounter: Payer: Self-pay | Admitting: General Surgery

## 2015-07-10 ENCOUNTER — Ambulatory Visit (INDEPENDENT_AMBULATORY_CARE_PROVIDER_SITE_OTHER): Payer: PPO | Admitting: General Surgery

## 2015-07-10 VITALS — BP 144/86 | HR 76 | Resp 12 | Ht 64.0 in | Wt 144.0 lb

## 2015-07-10 DIAGNOSIS — C50511 Malignant neoplasm of lower-outer quadrant of right female breast: Secondary | ICD-10-CM

## 2015-07-10 MED ORDER — LETROZOLE 2.5 MG PO TABS: 2.5000 mg | ORAL_TABLET | Freq: Every day | ORAL | Status: DC

## 2015-07-10 NOTE — Progress Notes (Signed)
Patient ID: Kristin Miller, female   DOB: 08-04-1935, 79 y.o.   MRN: [id number]  Chief Complaint  Patient presents with  . Routine Post Op    right lumpectomy    HPI Kristin Miller is a 79 y.o. female female here today for her post op right lumpectomy done 06/12/15. Patient states she is doing well. She had a biopsy of the spot on her right posterior shoulder, results showed an insect bite.  The patient noticed some thickness below the surgical incision, but has had no pain.  I personally reviewed the patient's history.  HPI  Past Medical History  Diagnosis Date  . Osteoporosis   . Hypertension   . Back pain   . Arthritis   . Collar bone fracture 05/28/2013    Fractured Collar bone on left  . Allergy   . Dysrhythmia     h/o heart skipping beat years ago  . Frequent headaches     h/o migraines  . Cancer (HCC)     BREAST  . Anemia     H/O AS A CHILD    Past Surgical History  Procedure Laterality Date  . Abdominal hysterectomy  1970    Partial  . Colonoscopy    . Breast biopsy Right 05/28/2015    INVASIVE MAMMARY CARCINOMA   . Breast excisional biopsy Right 06/12/2015    lumpectomy  . Breast lumpectomy with axillary lymph node dissection Right 06/12/2015    Procedure: BREAST LUMPECTOMY WITH AXILLARY LYMPH NODE DISSECTION;  Surgeon: Robert Bellow, MD;  Location: [facility] ORS;  Service: General;  Laterality: Right;  . Breast biopsy Right 06/12/2015    Procedure: BREAST BIOPSY WITH NEEDLE LOCALIZATION;  Surgeon: Robert Bellow, MD;  Location: [facility] ORS;  Service: General;  Laterality: Right;    Family History  Problem Relation Age of Onset  . Mental illness Mother     Dementia  . Stroke Father   . Cancer Brother     Lung Cancer    Social History Social History  Substance Use Topics  . Smoking status: Never Smoker   . Smokeless tobacco: Never Used  . Alcohol Use: No    Allergies  Allergen Reactions  . Other Rash    SUTURES-(PARTIAL HYSTERECTOMY)     Current Outpatient Prescriptions  Medication Sig Dispense Refill  . acetaminophen (TYLENOL) 500 MG tablet Take 500 mg by mouth every 6 (six) hours as needed for pain.    . calcium carbonate (OS-CAL - DOSED IN MG OF ELEMENTAL CALCIUM) 1250 MG tablet Take 1 tablet by mouth 2 (two) times daily with a meal.     . cetirizine (ZYRTEC) 10 MG tablet Take 10 mg by mouth as needed.     Marland Kitchen lisinopril-hydrochlorothiazide (PRINZIDE,ZESTORETIC) 20-12.5 MG per tablet Take 1 tablet by mouth daily. (Patient taking differently: Take 1 tablet by mouth every morning. ) 90 tablet 3  . Multiple Vitamin (MULTIVITAMIN) capsule Take 1 capsule by mouth daily.    Marland Kitchen letrozole (FEMARA) 2.5 MG tablet Take 1 tablet (2.5 mg total) by mouth daily. 30 tablet 12   No current facility-administered medications for this visit.    Review of Systems Review of Systems  Constitutional: Negative.   Respiratory: Negative.   Cardiovascular: Negative.     Blood pressure 144/86, pulse 76, resp. rate 12, height 5\' 4"  (1.626 m), weight 144 lb (65.318 kg).  Physical Exam Physical Exam  Constitutional: She is oriented to person, place, and time. She appears well-developed and well-nourished.  Cardiovascular: Normal rate, regular rhythm and normal heart sounds.   Pulmonary/Chest: Effort normal and breath sounds normal. Right breast exhibits no inverted nipple, no mass, no nipple discharge, no skin change and no tenderness. Left breast exhibits no inverted nipple, no mass, no nipple discharge, no skin change and no tenderness.    Right lumpectomy site is clean and healing ell.  Neurological: She is alert and oriented to person, place, and time.  Skin: Skin is warm and dry.       Assessment    T1a carcinoma the right breast.    Plan   The patient has declined medical oncology assessment. She has decided against radiation therapy. Her last bone density was in April 2015. We'll arrange for repeat study in April 2017. The  patient is making use of adequate amounts vitamin D and calcium.   Rx sent Femara. Possible vasomotor symptoms reviewed. The importance of giving at least a 1 month trial was reviewed.   Patient to return  in April 2017 after bone density test.   PCP:  Otilio Miu C  This information has been scribed by Karie Fetch RNBC.  Robert Bellow 07/10/2015, 11:01 AM

## 2015-07-10 NOTE — Patient Instructions (Addendum)
Patient to return in April 2017 after bone density test.

## 2015-07-26 NOTE — Addendum Note (Signed)
Encounter addended by: Wayne Both on: 07/26/2015  1:53 PM<BR>     Documentation filed: Charges VN

## 2015-07-26 NOTE — Addendum Note (Signed)
Encounter addended by: Coral Spikes, Rad Tech on: 07/26/2015  1:15 PM<BR>     Documentation filed: Charges VN

## 2015-08-06 ENCOUNTER — Other Ambulatory Visit: Payer: Self-pay

## 2015-08-06 ENCOUNTER — Telehealth: Payer: Self-pay | Admitting: General Surgery

## 2015-08-06 DIAGNOSIS — C50511 Malignant neoplasm of lower-outer quadrant of right female breast: Secondary | ICD-10-CM

## 2015-08-06 MED ORDER — LETROZOLE 2.5 MG PO TABS: 2.5000 mg | ORAL_TABLET | Freq: Every day | ORAL | Status: DC

## 2015-08-06 NOTE — Telephone Encounter (Signed)
08-06-15 PT CALLED TO ASK IF SHE COULD HAVE A 90 DAY SCRIPT FOR LETROZOLE 2.5 MG TAB TEV. SHE WAS TAKING THEM FOR 30 DAYS TO TRY THEM. SHE WILL TAKE HER LAST PILL ON Wednesday 08-08-15.PLEASE CALL [facility] IN [place] FOR RE-FILL.

## 2015-08-06 NOTE — Telephone Encounter (Signed)
OK for 90 day supply with 3 refills.

## 2015-08-06 NOTE — Telephone Encounter (Signed)
Script sent in for #90 with 3 refills. Patient notified of update.

## 2015-08-23 ENCOUNTER — Other Ambulatory Visit: Payer: Self-pay | Admitting: *Deleted

## 2015-08-23 DIAGNOSIS — Z780 Asymptomatic menopausal state: Secondary | ICD-10-CM

## 2015-08-28 ENCOUNTER — Encounter: Payer: Self-pay | Admitting: *Deleted

## 2015-10-15 ENCOUNTER — Ambulatory Visit: Payer: PPO | Admitting: Nurse Practitioner

## 2015-11-14 ENCOUNTER — Ambulatory Visit
Admission: RE | Admit: 2015-11-14 | Discharge: 2015-11-14 | Disposition: A | Discharge status: Home / Self Care | Payer: PPO | Source: Ambulatory Visit | Attending: General Surgery | Admitting: General Surgery

## 2015-11-14 DIAGNOSIS — Z13820 Encounter for screening for osteoporosis: Secondary | ICD-10-CM | POA: Diagnosis not present

## 2015-11-14 DIAGNOSIS — Z780 Asymptomatic menopausal state: Secondary | ICD-10-CM

## 2015-11-15 ENCOUNTER — Ambulatory Visit (INDEPENDENT_AMBULATORY_CARE_PROVIDER_SITE_OTHER): Payer: PPO | Admitting: Family Medicine

## 2015-11-15 ENCOUNTER — Encounter: Payer: Self-pay | Admitting: Family Medicine

## 2015-11-15 VITALS — BP 120/82 | HR 80 | Ht 64.0 in | Wt 144.0 lb

## 2015-11-15 DIAGNOSIS — I10 Essential (primary) hypertension: Secondary | ICD-10-CM | POA: Insufficient documentation

## 2015-11-15 DIAGNOSIS — M810 Age-related osteoporosis without current pathological fracture: Secondary | ICD-10-CM | POA: Insufficient documentation

## 2015-11-15 MED ORDER — LISINOPRIL-HYDROCHLOROTHIAZIDE 20-12.5 MG PO TABS: 1.0000 | ORAL_TABLET | Freq: Every day | ORAL | Status: DC

## 2015-11-15 NOTE — Progress Notes (Signed)
Name: Kristin Miller   MRN: [id number]    DOB: 1935/11/26   Date:11/15/2015       Progress Note  Subjective  Chief Complaint  Chief Complaint  Patient presents with  . Hypertension    Hypertension This is a chronic problem. The current episode started more than 1 year ago. The problem has been gradually improving since onset. The problem is controlled. Pertinent negatives include no anxiety, blurred vision, chest pain, headaches, malaise/fatigue, neck pain, orthopnea, palpitations, peripheral edema, PND, shortness of breath or sweats. There are no associated agents to hypertension. There are no known risk factors for coronary artery disease. Past treatments include ACE inhibitors and diuretics. The current treatment provides moderate improvement. There are no compliance problems.  There is no history of angina, kidney disease, CAD/MI, CVA, heart failure, left ventricular hypertrophy, PVD, renovascular disease or retinopathy. There is no history of chronic renal disease or a hypertension causing med.    No problem-specific assessment & plan notes found for this encounter.   Past Medical History  Diagnosis Date  . Osteoporosis   . Hypertension   . Back pain   . Arthritis   . Collar bone fracture 05/28/2013    Fractured Collar bone on left  . Allergy   . Dysrhythmia     h/o heart skipping beat years ago  . Frequent headaches     h/o migraines  . Cancer (HCC)     BREAST  . Anemia     H/O AS A CHILD    Past Surgical History  Procedure Laterality Date  . Abdominal hysterectomy  1970    Partial  . Colonoscopy    . Breast biopsy Right 05/28/2015    INVASIVE MAMMARY CARCINOMA   . Breast excisional biopsy Right 06/12/2015    lumpectomy  . Breast lumpectomy with axillary lymph node dissection Right 06/12/2015    Procedure: BREAST LUMPECTOMY WITH AXILLARY LYMPH NODE DISSECTION;  Surgeon: Robert Bellow, MD;  Location: [facility];  Service: General;  Laterality: Right;  . Breast  biopsy Right 06/12/2015    Procedure: BREAST BIOPSY WITH NEEDLE LOCALIZATION;  Surgeon: Robert Bellow, MD;  Location: [facility];  Service: General;  Laterality: Right;    Family History  Problem Relation Age of Onset  . Mental illness Mother     Dementia  . Stroke Father   . Cancer Brother     Lung Cancer    Social History   Social History  . Marital Status: Married    Spouse Name: N/A  . Number of Children: N/A  . Years of Education: N/A   Occupational History  . Not on file.   Social History Main Topics  . Smoking status: Never Smoker   . Smokeless tobacco: Never Used  . Alcohol Use: No  . Drug Use: No  . Sexual Activity: Not Currently   Other Topics Concern  . Not on file   Social History Narrative    Allergies  Allergen Reactions  . Other Rash    SUTURES-(PARTIAL HYSTERECTOMY)     Review of Systems  Constitutional: Negative for fever, chills, weight loss and malaise/fatigue.  HENT: Negative for ear discharge, ear pain and sore throat.   Eyes: Negative for blurred vision.  Respiratory: Negative for cough, sputum production, shortness of breath and wheezing.   Cardiovascular: Negative for chest pain, palpitations, orthopnea, leg swelling and PND.  Gastrointestinal: Negative for heartburn, nausea, abdominal pain, diarrhea, constipation, blood in stool and melena.  Genitourinary: Negative  for dysuria, urgency, frequency and hematuria.  Musculoskeletal: Positive for back pain. Negative for myalgias, joint pain and neck pain.  Skin: Negative for rash.  Neurological: Negative for dizziness, tingling, sensory change, focal weakness and headaches.  Endo/Heme/Allergies: Negative for environmental allergies and polydipsia. Does not bruise/bleed easily.  Psychiatric/Behavioral: Negative for depression and suicidal ideas. The patient is not nervous/anxious and does not have insomnia.      Objective  Filed Vitals:   11/15/15 0959  BP: 120/82  Pulse: 80   Height: 5\' 4"  (1.626 m)  Weight: 144 lb (65.318 kg)    Physical Exam  Constitutional: She is well-developed, well-nourished, and in no distress. No distress.  HENT:  Head: Normocephalic and atraumatic.  Right Ear: Tympanic membrane, external ear and ear canal normal.  Left Ear: Tympanic membrane, external ear and ear canal normal.  Nose: Nose normal.  Mouth/Throat: Oropharynx is clear and moist.  Eyes: Conjunctivae and EOM are normal. Pupils are equal, round, and reactive to light. Right eye exhibits no discharge. Left eye exhibits no discharge.  Neck: Normal range of motion. Neck supple. No JVD present. No thyromegaly present.  Cardiovascular: Normal rate, regular rhythm, normal heart sounds and intact distal pulses.  Exam reveals no gallop and no friction rub.   No murmur heard. Pulmonary/Chest: Effort normal and breath sounds normal.  Abdominal: Soft. Bowel sounds are normal. She exhibits no mass. There is no tenderness. There is no guarding.  Musculoskeletal: Normal range of motion. She exhibits no edema.  Lymphadenopathy:    She has no cervical adenopathy.  Neurological: She is alert.  Skin: Skin is warm and dry. She is not diaphoretic.  Psychiatric: Mood and affect normal.  Nursing note and vitals reviewed.     Assessment & Plan  Problem List Items Addressed This Visit      Cardiovascular and Mediastinum   HTN (hypertension) - Primary   Relevant Medications   lisinopril-hydrochlorothiazide (PRINZIDE,ZESTORETIC) 20-12.5 MG tablet   Other Relevant Orders   Renal Function Panel        Dr. Otilio Miu [facility]  11/15/2015

## 2015-11-17 LAB — RENAL FUNCTION PANEL
ALBUMIN: 4.4 g/dL (ref 3.5–4.8)
BUN/Creatinine Ratio: 20 (ref 12–28)
BUN: 15 mg/dL (ref 8–27)
CO2: 23 mmol/L (ref 18–29)
CREATININE: 0.75 mg/dL (ref 0.57–1.00)
Calcium: 9.4 mg/dL (ref 8.7–10.3)
Chloride: 100 mmol/L (ref 96–106)
GFR, EST AFRICAN AMERICAN: 89 mL/min/{1.73_m2} (ref >59)
GFR, EST NON AFRICAN AMERICAN: 77 mL/min/{1.73_m2} (ref >59)
GLUCOSE: 80 mg/dL (ref 65–99)
POTASSIUM: 4.3 mmol/L (ref 3.5–5.2)
Phosphorus: 3.5 mg/dL (ref 2.5–4.5)
Sodium: 139 mmol/L (ref 134–144)

## 2015-11-20 ENCOUNTER — Ambulatory Visit (INDEPENDENT_AMBULATORY_CARE_PROVIDER_SITE_OTHER): Payer: PPO | Admitting: General Surgery

## 2015-11-20 ENCOUNTER — Encounter: Payer: Self-pay | Admitting: General Surgery

## 2015-11-20 VITALS — BP 120/74 | HR 80 | Resp 12 | Ht 64.0 in | Wt 146.0 lb

## 2015-11-20 DIAGNOSIS — C50511 Malignant neoplasm of lower-outer quadrant of right female breast: Secondary | ICD-10-CM | POA: Diagnosis not present

## 2015-11-20 NOTE — Progress Notes (Addendum)
Patient ID: Kristin Miller, female   DOB: 04/09/1936, 80 y.o.   MRN: [id number]  Chief Complaint  Patient presents with  . Follow-up    breast cancer    HPI Kristin Miller is a 80 y.o. female here for a follow up for breast cancer. She had a bone density study completed on 11/14/15. She states her left hand has swollen with some pain but not every day and worse at night. Described as a "throbbing" discomfort. This is present across all the digits. Usually occurs during the night. She reports sleeping on her right side. She states her left hand becomes normal later that morning. The patient best appreciated this because of difficulty removing her rings in the morning  Tolerating Femara without significant vasomotor symptoms. I personally reviewed the patient's history. HPI  Past Medical History  Diagnosis Date  . Osteoporosis   . Hypertension   . Back pain   . Arthritis   . Collar bone fracture 05/28/2013    Fractured Collar bone on left  . Allergy   . Dysrhythmia     h/o heart skipping beat years ago  . Frequent headaches     h/o migraines  . Cancer (HCC)     BREAST  . Anemia     H/O AS A CHILD    Past Surgical History  Procedure Laterality Date  . Abdominal hysterectomy  1970    Partial  . Colonoscopy    . Breast biopsy Right 05/28/2015    INVASIVE MAMMARY CARCINOMA   . Breast excisional biopsy Right 06/12/2015    lumpectomy  . Breast lumpectomy with axillary lymph node dissection Right 06/12/2015    Procedure: BREAST LUMPECTOMY WITH AXILLARY LYMPH NODE DISSECTION;  Surgeon: Robert Bellow, MD;  Location: [facility];  Service: General;  Laterality: Right;  . Breast biopsy Right 06/12/2015    Procedure: BREAST BIOPSY WITH NEEDLE LOCALIZATION;  Surgeon: Robert Bellow, MD;  Location: [facility];  Service: General;  Laterality: Right;    Family History  Problem Relation Age of Onset  . Mental illness Mother     Dementia  . Stroke Father   . Cancer Brother     Lung  Cancer    Social History Social History  Substance Use Topics  . Smoking status: Never Smoker   . Smokeless tobacco: Never Used  . Alcohol Use: No    Allergies  Allergen Reactions  . Other Rash    SUTURES-(PARTIAL HYSTERECTOMY)    Current Outpatient Prescriptions  Medication Sig Dispense Refill  . acetaminophen (TYLENOL) 500 MG tablet Take 500 mg by mouth every 6 (six) hours as needed for pain.    . Calcium-Vitamin D (CALTRATE 600 PLUS-VIT D PO) Take 2 tablets by mouth daily.    . cetirizine (ZYRTEC) 10 MG tablet Take 10 mg by mouth as needed.     Marland Kitchen letrozole (FEMARA) 2.5 MG tablet Take 1 tablet (2.5 mg total) by mouth daily. 90 tablet 3  . lisinopril-hydrochlorothiazide (PRINZIDE,ZESTORETIC) 20-12.5 MG tablet Take 1 tablet by mouth daily. 90 tablet 1  . Multiple Vitamins-Minerals (CENTRUM SILVER ULTRA WOMENS PO) Take 1 tablet by mouth daily.     No current facility-administered medications for this visit.    Review of Systems Review of Systems  Constitutional: Negative.   Respiratory: Negative.   Cardiovascular: Negative.     Blood pressure 120/74, pulse 80, resp. rate 12, height 5\' 4"  (1.626 m), weight 146 lb (66.225 kg).  Physical Exam Physical Exam  Constitutional: She is oriented to person, place, and time. She appears well-developed and well-nourished.  HENT:  Mouth/Throat: Oropharynx is clear and moist.  Eyes: Conjunctivae are normal. No scleral icterus.  Neck: Neck supple.  Cardiovascular: Normal rate, regular rhythm and normal heart sounds.   Pulmonary/Chest: Effort normal and breath sounds normal. Right breast exhibits no inverted nipple, no mass, no nipple discharge, no skin change and no tenderness. Left breast exhibits no inverted nipple, no mass, no nipple discharge, no skin change and no tenderness.    Upper arm measurements were obtained at location 15 cm superior to the olecranon process as well as 10 and 20 cm below the olecranon process.  Right:   31.5, 24, 17 cm Left: 29.5, 22.5, 16 cm.    Lymphadenopathy:    She has no cervical adenopathy.    She has no axillary adenopathy.  Neurological: She is alert and oriented to person, place, and time.  Skin: Skin is warm and dry.  Psychiatric: Her behavior is normal.    Data Reviewed 11/14/2015 bone density exam: The probability of a major osteoporotic fracture is 20.2% within the next ten years.  The probability of a hip fracture is 4.8% within the next ten years.   Assessment    Doing well status post right breast wide excision. Has declined radiation oncology assessment.    Plan        Patient to have a bilateral diagnostic mammogram follow up in 6 months.  Continue her calcium/vitamin D supplements. She reports that she goes to the gym regularly for exercise. [place] will certainly help maintain bone density.   PCP:  Otilio Miu C This information has been scribed by Karie Fetch RN, BSN,BC.    Robert Bellow 11/21/2015, 3:52 PM

## 2015-11-20 NOTE — Patient Instructions (Addendum)
The patient is aware to call back for any questions or concerns. Patient to have a bilateral diagnostic mammogram follow up in 6 months.  

## 2015-11-21 ENCOUNTER — Encounter: Payer: Self-pay | Admitting: General Surgery

## 2016-03-19 ENCOUNTER — Other Ambulatory Visit: Payer: Self-pay | Admitting: General Surgery

## 2016-03-19 DIAGNOSIS — C50511 Malignant neoplasm of lower-outer quadrant of right female breast: Secondary | ICD-10-CM

## 2016-03-26 ENCOUNTER — Encounter: Payer: Self-pay | Admitting: *Deleted

## 2016-04-28 ENCOUNTER — Ambulatory Visit
Admission: RE | Admit: 2016-04-28 | Discharge: 2016-04-28 | Disposition: A | Discharge status: Home / Self Care | Payer: PPO | Source: Ambulatory Visit | Attending: General Surgery | Admitting: General Surgery

## 2016-04-28 DIAGNOSIS — C50511 Malignant neoplasm of lower-outer quadrant of right female breast: Secondary | ICD-10-CM | POA: Diagnosis not present

## 2016-05-01 ENCOUNTER — Encounter: Payer: Self-pay | Admitting: *Deleted

## 2016-05-07 ENCOUNTER — Ambulatory Visit (INDEPENDENT_AMBULATORY_CARE_PROVIDER_SITE_OTHER): Payer: PPO | Admitting: General Surgery

## 2016-05-07 ENCOUNTER — Encounter: Payer: Self-pay | Admitting: General Surgery

## 2016-05-07 VITALS — BP 162/78 | HR 82 | Resp 14 | Ht 61.0 in | Wt 145.0 lb

## 2016-05-07 DIAGNOSIS — C50511 Malignant neoplasm of lower-outer quadrant of right female breast: Secondary | ICD-10-CM | POA: Diagnosis not present

## 2016-05-07 NOTE — Patient Instructions (Signed)
The patient is aware to call back for any questions or concerns. The patient has been asked to return to the office in one year with a bilateral diagnostic mammogram. 

## 2016-05-07 NOTE — Progress Notes (Signed)
Patient ID: Kristin Miller, female   DOB: August 20, 1935, 80 y.o.   MRN: [id number]  Chief Complaint  Patient presents with  . Follow-up    HPI Kristin Miller is a 80 y.o. female.  who presents for her breast cancer follow up and a breast evaluation. The most recent mammogram was done on 04-28-16 .  Patient does perform regular self breast checks and gets regular mammograms done.  No new breast issues. Tolerating letrozole.   HPI  Past Medical History:  Diagnosis Date  . Allergy   . Anemia    H/O AS A CHILD  . Arthritis   . Back pain   . Cancer (HCC)    BREAST  . Collar bone fracture 05/28/2013   Fractured Collar bone on left  . Dysrhythmia    h/o heart skipping beat years ago  . Frequent headaches    h/o migraines  . Hypertension   . Osteoporosis     Past Surgical History:  Procedure Laterality Date  . ABDOMINAL HYSTERECTOMY  1970   Partial  . BREAST BIOPSY Right 05/28/2015   INVASIVE MAMMARY CARCINOMA   . BREAST BIOPSY Right 06/12/2015   Wide excision, sentinel lymph node biopsy.  Marland Kitchen BREAST LUMPECTOMY WITH AXILLARY LYMPH NODE DISSECTION Right 06/12/2015        . COLONOSCOPY      Family History  Problem Relation Age of Onset  . Mental illness Mother     Dementia  . Stroke Father   . Cancer Brother     Lung Cancer    Social History Social History  Substance Use Topics  . Smoking status: Never Smoker  . Smokeless tobacco: Never Used  . Alcohol use No    Allergies  Allergen Reactions  . Other Rash    SUTURES-(PARTIAL HYSTERECTOMY)    Current Outpatient Prescriptions  Medication Sig Dispense Refill  . acetaminophen (TYLENOL) 500 MG tablet Take 500 mg by mouth every 6 (six) hours as needed for pain.    . Calcium-Vitamin D (CALTRATE 600 PLUS-VIT D PO) Take 2 tablets by mouth daily.    . cetirizine (ZYRTEC) 10 MG tablet Take 10 mg by mouth as needed.     Marland Kitchen letrozole (FEMARA) 2.5 MG tablet Take 1 tablet (2.5 mg total) by mouth daily. 90 tablet 3  .  lisinopril-hydrochlorothiazide (PRINZIDE,ZESTORETIC) 20-12.5 MG tablet Take 1 tablet by mouth daily. 90 tablet 1  . Multiple Vitamins-Minerals (CENTRUM SILVER ULTRA WOMENS PO) Take 1 tablet by mouth daily.     No current facility-administered medications for this visit.     Review of Systems Review of Systems  Constitutional: Negative.   Respiratory: Negative.   Cardiovascular: Negative.     Blood pressure (!) 162/78, pulse 82, resp. rate 14, height 5\' 1"  (1.549 m), weight 145 lb (65.8 kg).  Physical Exam Physical Exam  Constitutional: She is oriented to person, place, and time. She appears well-developed and well-nourished.  HENT:  Mouth/Throat: Oropharynx is clear and moist.  Eyes: Conjunctivae are normal. No scleral icterus.  Neck: Neck supple.  Cardiovascular: Normal rate, regular rhythm and normal heart sounds.   Pulmonary/Chest: Effort normal and breath sounds normal. Right breast exhibits no inverted nipple, no mass, no nipple discharge, no skin change and no tenderness. Left breast exhibits no inverted nipple, no mass, no nipple discharge, no skin change and no tenderness.    Musculoskeletal:       Arms: Lymphadenopathy:    She has no cervical adenopathy.  She has no axillary adenopathy.  Neurological: She is alert and oriented to person, place, and time.  Skin: Skin is warm and dry.  Psychiatric: Her behavior is normal.    Data Reviewed Bilateral mammograms dated 04/28/2016 were reviewed. BI-RADS-2.  Assessment    Doing well now one year status post wide excision.   Plan    The patient had declined radiation therapy. She is making use of Femara as requested.     The patient has been asked to return to the office in one year with a bilateral diagnostic mammogram.  This information has been scribed by Karie Fetch RN, BSN,BC.   Robert Bellow 05/09/2016, 2:26 PM

## 2016-05-19 ENCOUNTER — Ambulatory Visit: Payer: PPO | Admitting: Family Medicine

## 2016-05-26 ENCOUNTER — Other Ambulatory Visit: Payer: Self-pay | Admitting: *Deleted

## 2016-05-26 ENCOUNTER — Ambulatory Visit
Admission: RE | Admit: 2016-05-26 | Discharge: 2016-05-26 | Disposition: A | Discharge status: Home / Self Care | Payer: PPO | Source: Ambulatory Visit | Attending: Family Medicine | Admitting: Family Medicine

## 2016-05-26 ENCOUNTER — Other Ambulatory Visit: Payer: Self-pay | Admitting: Family Medicine

## 2016-05-26 ENCOUNTER — Encounter: Payer: Self-pay | Admitting: Family Medicine

## 2016-05-26 ENCOUNTER — Ambulatory Visit (INDEPENDENT_AMBULATORY_CARE_PROVIDER_SITE_OTHER): Payer: PPO | Admitting: Family Medicine

## 2016-05-26 VITALS — BP 110/80 | HR 88 | Temp 98.0°F | Ht 61.0 in | Wt 144.0 lb

## 2016-05-26 DIAGNOSIS — I10 Essential (primary) hypertension: Secondary | ICD-10-CM | POA: Diagnosis not present

## 2016-05-26 DIAGNOSIS — J181 Lobar pneumonia, unspecified organism: Secondary | ICD-10-CM

## 2016-05-26 DIAGNOSIS — J189 Pneumonia, unspecified organism: Secondary | ICD-10-CM

## 2016-05-26 DIAGNOSIS — R05 Cough: Secondary | ICD-10-CM | POA: Diagnosis present

## 2016-05-26 MED ORDER — GUAIFENESIN-CODEINE 100-10 MG/5ML PO SYRP: 5.0000 mL | ORAL_SOLUTION | Freq: Three times a day (TID) | ORAL | 0 refills | Status: DC | PRN

## 2016-05-26 MED ORDER — AMOXICILLIN-POT CLAVULANATE 875-125 MG PO TABS: 1.0000 | ORAL_TABLET | Freq: Two times a day (BID) | ORAL | 0 refills | Status: DC

## 2016-05-26 MED ORDER — LISINOPRIL-HYDROCHLOROTHIAZIDE 20-12.5 MG PO TABS: 1.0000 | ORAL_TABLET | Freq: Every day | ORAL | 1 refills | Status: DC

## 2016-05-26 NOTE — Progress Notes (Signed)
Name: SABIRIN KRAVCHENKO   MRN: [id number]    DOB: 1935-10-07   Date:05/26/2016       Progress Note  Subjective  Chief Complaint  Chief Complaint  Patient presents with  . Hypertension  . Sinusitis    cough and cong- had started with sneezing and runny nose. Tried otc- didn't work/ cough and cong started after that. No production    Hypertension  This is a chronic problem. The current episode started more than 1 year ago. The problem has been waxing and waning since onset. The problem is controlled. Pertinent negatives include no anxiety, blurred vision, chest pain, headaches, malaise/fatigue, neck pain, orthopnea, palpitations, peripheral edema, PND, shortness of breath or sweats. There are no associated agents to hypertension. Past treatments include ACE inhibitors and diuretics. The current treatment provides mild improvement. There are no compliance problems.  There is no history of angina, kidney disease, CAD/MI, CVA, heart failure, left ventricular hypertrophy, PVD, renovascular disease or retinopathy. There is no history of chronic renal disease or a hypertension causing med.  Sinusitis  This is a chronic problem. The current episode started more than 1 year ago. The problem has been gradually improving since onset. There has been no fever. The pain is mild. Associated symptoms include chills, congestion, coughing and sinus pressure. Pertinent negatives include no ear pain, headaches, neck pain, shortness of breath, sneezing or sore throat. Past treatments include nothing. The treatment provided mild relief.    No problem-specific Assessment & Plan notes found for this encounter.   Past Medical History:  Diagnosis Date  . Allergy   . Anemia    H/O AS A CHILD  . Arthritis   . Back pain   . Cancer (HCC)    BREAST  . Collar bone fracture 05/28/2013   Fractured Collar bone on left  . Dysrhythmia    h/o heart skipping beat years ago  . Frequent headaches    h/o migraines  .  Hypertension   . Osteoporosis     Past Surgical History:  Procedure Laterality Date  . ABDOMINAL HYSTERECTOMY  1970   Partial  . BREAST BIOPSY Right 05/28/2015   INVASIVE MAMMARY CARCINOMA   . BREAST BIOPSY Right 06/12/2015   Wide excision, sentinel lymph node biopsy.  Marland Kitchen BREAST LUMPECTOMY WITH AXILLARY LYMPH NODE DISSECTION Right 06/12/2015        . COLONOSCOPY      Family History  Problem Relation Age of Onset  . Mental illness Mother     Dementia  . Stroke Father   . Cancer Brother     Lung Cancer    Social History   Social History  . Marital status: Married    Spouse name: N/A  . Number of children: N/A  . Years of education: N/A   Occupational History  . Not on file.   Social History Main Topics  . Smoking status: Never Smoker  . Smokeless tobacco: Never Used  . Alcohol use No  . Drug use: No  . Sexual activity: Not Currently   Other Topics Concern  . Not on file   Social History Narrative  . No narrative on file    Allergies  Allergen Reactions  . Other Rash    SUTURES-(PARTIAL HYSTERECTOMY)     Review of Systems  Constitutional: Positive for chills. Negative for fever, malaise/fatigue and weight loss.  HENT: Positive for congestion and sinus pressure. Negative for ear discharge, ear pain, sneezing and sore throat.   Eyes: Negative  for blurred vision.  Respiratory: Positive for cough. Negative for sputum production, shortness of breath and wheezing.   Cardiovascular: Negative for chest pain, palpitations, orthopnea, leg swelling and PND.  Gastrointestinal: Negative for abdominal pain, blood in stool, constipation, diarrhea, heartburn, melena and nausea.  Genitourinary: Negative for dysuria, frequency, hematuria and urgency.  Musculoskeletal: Negative for back pain, joint pain, myalgias and neck pain.  Skin: Negative for rash.  Neurological: Negative for dizziness, tingling, sensory change, focal weakness and headaches.  Endo/Heme/Allergies:  Negative for environmental allergies and polydipsia. Does not bruise/bleed easily.  Psychiatric/Behavioral: Negative for depression and suicidal ideas. The patient is not nervous/anxious and does not have insomnia.      Objective  Vitals:   05/26/16 0925  BP: 110/80  Pulse: 88  Temp: 98 F (36.7 C)  TempSrc: Oral  SpO2: 98%  Weight: 144 lb (65.3 kg)  Height: 5\' 1"  (1.549 m)    Physical Exam  Constitutional: She is well-developed, well-nourished, and in no distress. No distress.  HENT:  Head: Normocephalic and atraumatic.  Right Ear: External ear normal.  Left Ear: External ear normal.  Nose: Nose normal.  Mouth/Throat: Oropharynx is clear and moist.  Eyes: Conjunctivae and EOM are normal. Pupils are equal, round, and reactive to light. Right eye exhibits no discharge. Left eye exhibits no discharge.  Neck: Normal range of motion. Neck supple. No JVD present. No thyromegaly present.  Cardiovascular: Normal rate, regular rhythm, normal heart sounds and intact distal pulses.  Exam reveals no gallop and no friction rub.   No murmur heard. Pulmonary/Chest: Effort normal. She has no wheezes. She has rales.  RML rales  Abdominal: Soft. Bowel sounds are normal. She exhibits no mass. There is no tenderness. There is no guarding.  Musculoskeletal: Normal range of motion. She exhibits no edema.  Lymphadenopathy:    She has no cervical adenopathy.  Neurological: She is alert. She has normal reflexes.  Skin: Skin is warm and dry. She is not diaphoretic.  Psychiatric: Mood and affect normal.  Nursing note and vitals reviewed.     Assessment & Plan  Problem List Items Addressed This Visit      Cardiovascular and Mediastinum   HTN (hypertension) - Primary   Relevant Medications   lisinopril-hydrochlorothiazide (PRINZIDE,ZESTORETIC) 20-12.5 MG tablet   Other Relevant Orders   Renal Function Panel    Other Visit Diagnoses    Pneumonia of right middle lobe due to infectious  organism [facility])       Relevant Medications   amoxicillin-clavulanate (AUGMENTIN) 875-125 MG tablet   guaiFENesin-codeine (ROBITUSSIN AC) 100-10 MG/5ML syrup   Other Relevant Orders   DG Chest 2 View (Completed)   CBC        Dr. Kenadi M[facility]  05/26/16

## 2016-05-27 LAB — CBC
HEMATOCRIT: 40 % (ref 34.0–46.6)
Hemoglobin: 13.4 g/dL (ref 11.1–15.9)
MCH: 29.3 pg (ref 26.6–33.0)
MCHC: 33.5 g/dL (ref 31.5–35.7)
MCV: 87 fL (ref 79–97)
Platelets: 268 10*3/uL (ref 150–379)
RBC: 4.58 x10E6/uL (ref 3.77–5.28)
RDW: 13.7 % (ref 12.3–15.4)
WBC: 8.4 10*3/uL (ref 3.4–10.8)

## 2016-05-27 LAB — RENAL FUNCTION PANEL
Albumin: 3.8 g/dL (ref 3.5–4.8)
BUN / CREAT RATIO: 20 (ref 12–28)
BUN: 15 mg/dL (ref 8–27)
CALCIUM: 8.8 mg/dL (ref 8.7–10.3)
CHLORIDE: 101 mmol/L (ref 96–106)
CO2: 26 mmol/L (ref 18–29)
Creatinine, Ser: 0.75 mg/dL (ref 0.57–1.00)
GFR calc non Af Amer: 77 mL/min/{1.73_m2} (ref >59)
GFR, EST AFRICAN AMERICAN: 88 mL/min/{1.73_m2} (ref >59)
GLUCOSE: 92 mg/dL (ref 65–99)
POTASSIUM: 4.7 mmol/L (ref 3.5–5.2)
Phosphorus: 2.5 mg/dL (ref 2.5–4.5)
SODIUM: 141 mmol/L (ref 134–144)

## 2016-08-04 ENCOUNTER — Other Ambulatory Visit: Payer: Self-pay | Admitting: General Surgery

## 2016-08-04 DIAGNOSIS — C50511 Malignant neoplasm of lower-outer quadrant of right female breast: Secondary | ICD-10-CM

## 2016-11-17 DIAGNOSIS — H40003 Preglaucoma, unspecified, bilateral: Secondary | ICD-10-CM | POA: Diagnosis not present

## 2016-11-26 IMAGING — CR DG CHEST 2V
2 series · 2 of 2 positions shown · non-contrast
Comparison: None.

CLINICAL DATA: Pt has been having dry cough with rales RML,
fatigue, right ant lower chest pain, chills and decreasedappetite x
1 week. Pt states she had lumpectomy right breast after cancer
diagnosis 1 year ago. Hx breast cancer, walking pneumonia.

EXAM:
CHEST  2 VIEW

[chest pa]
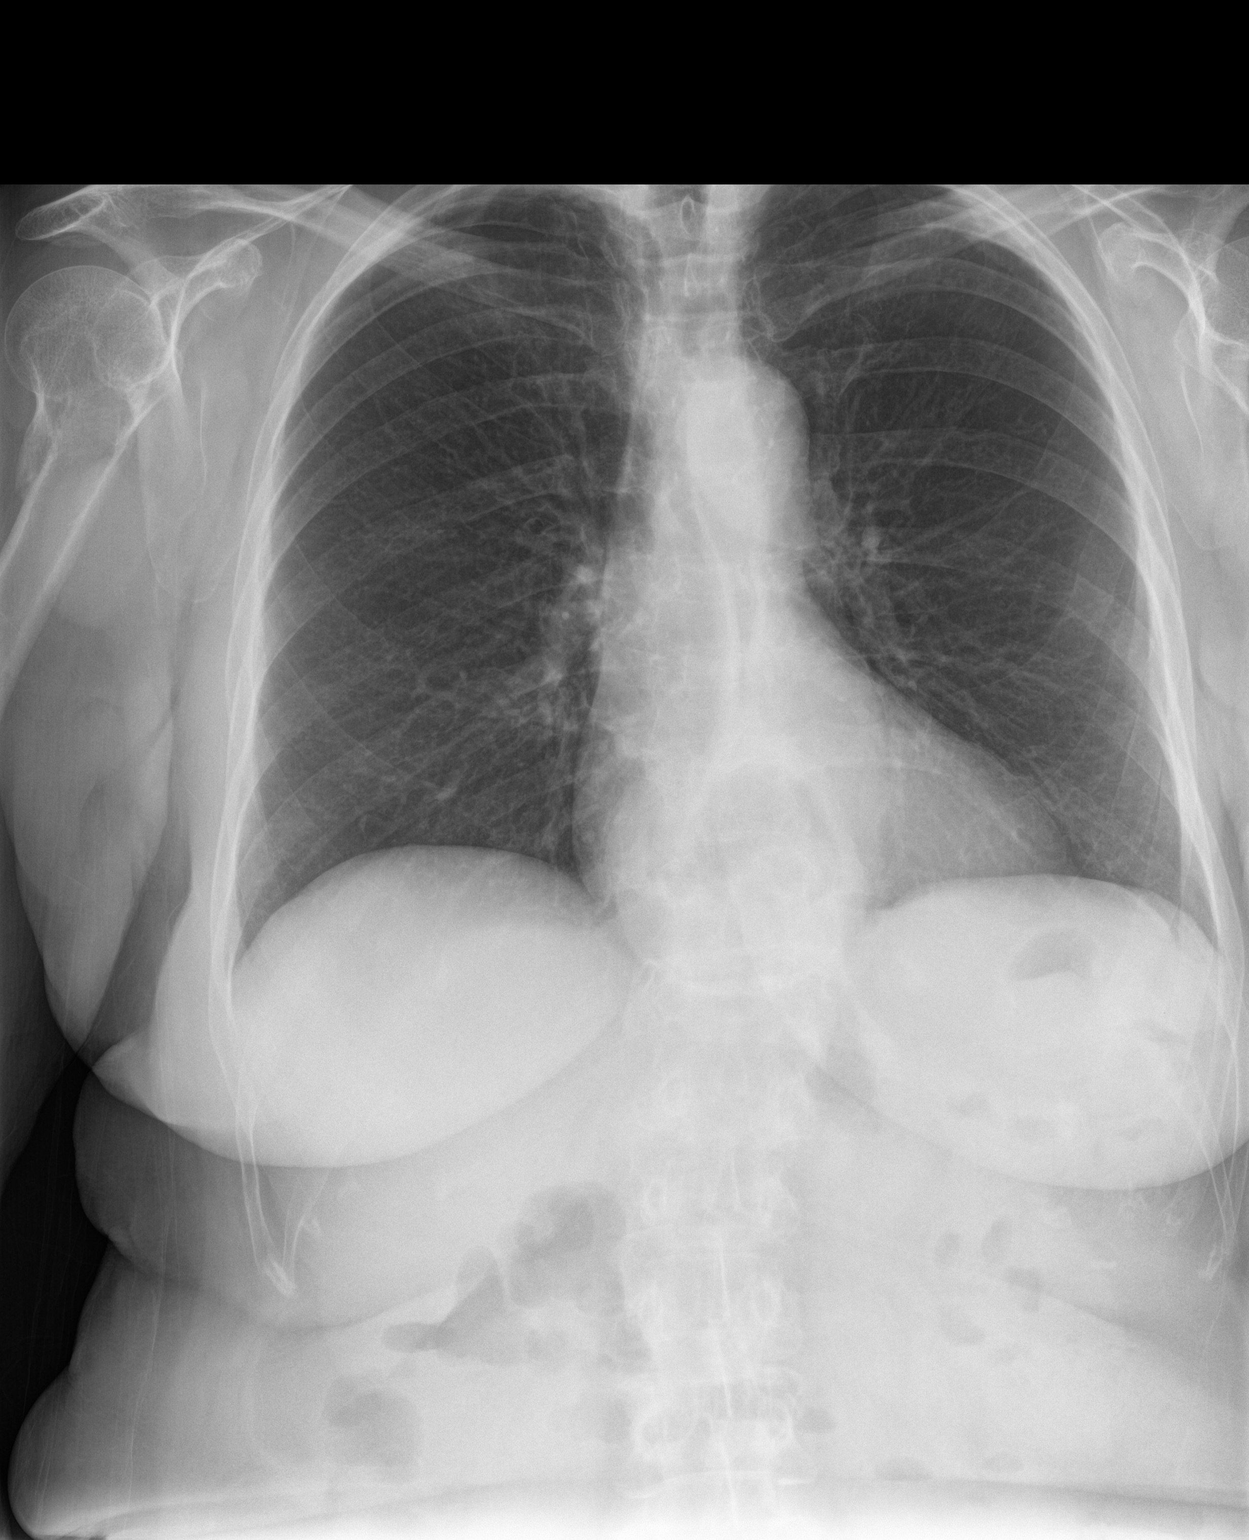

[chest lat]
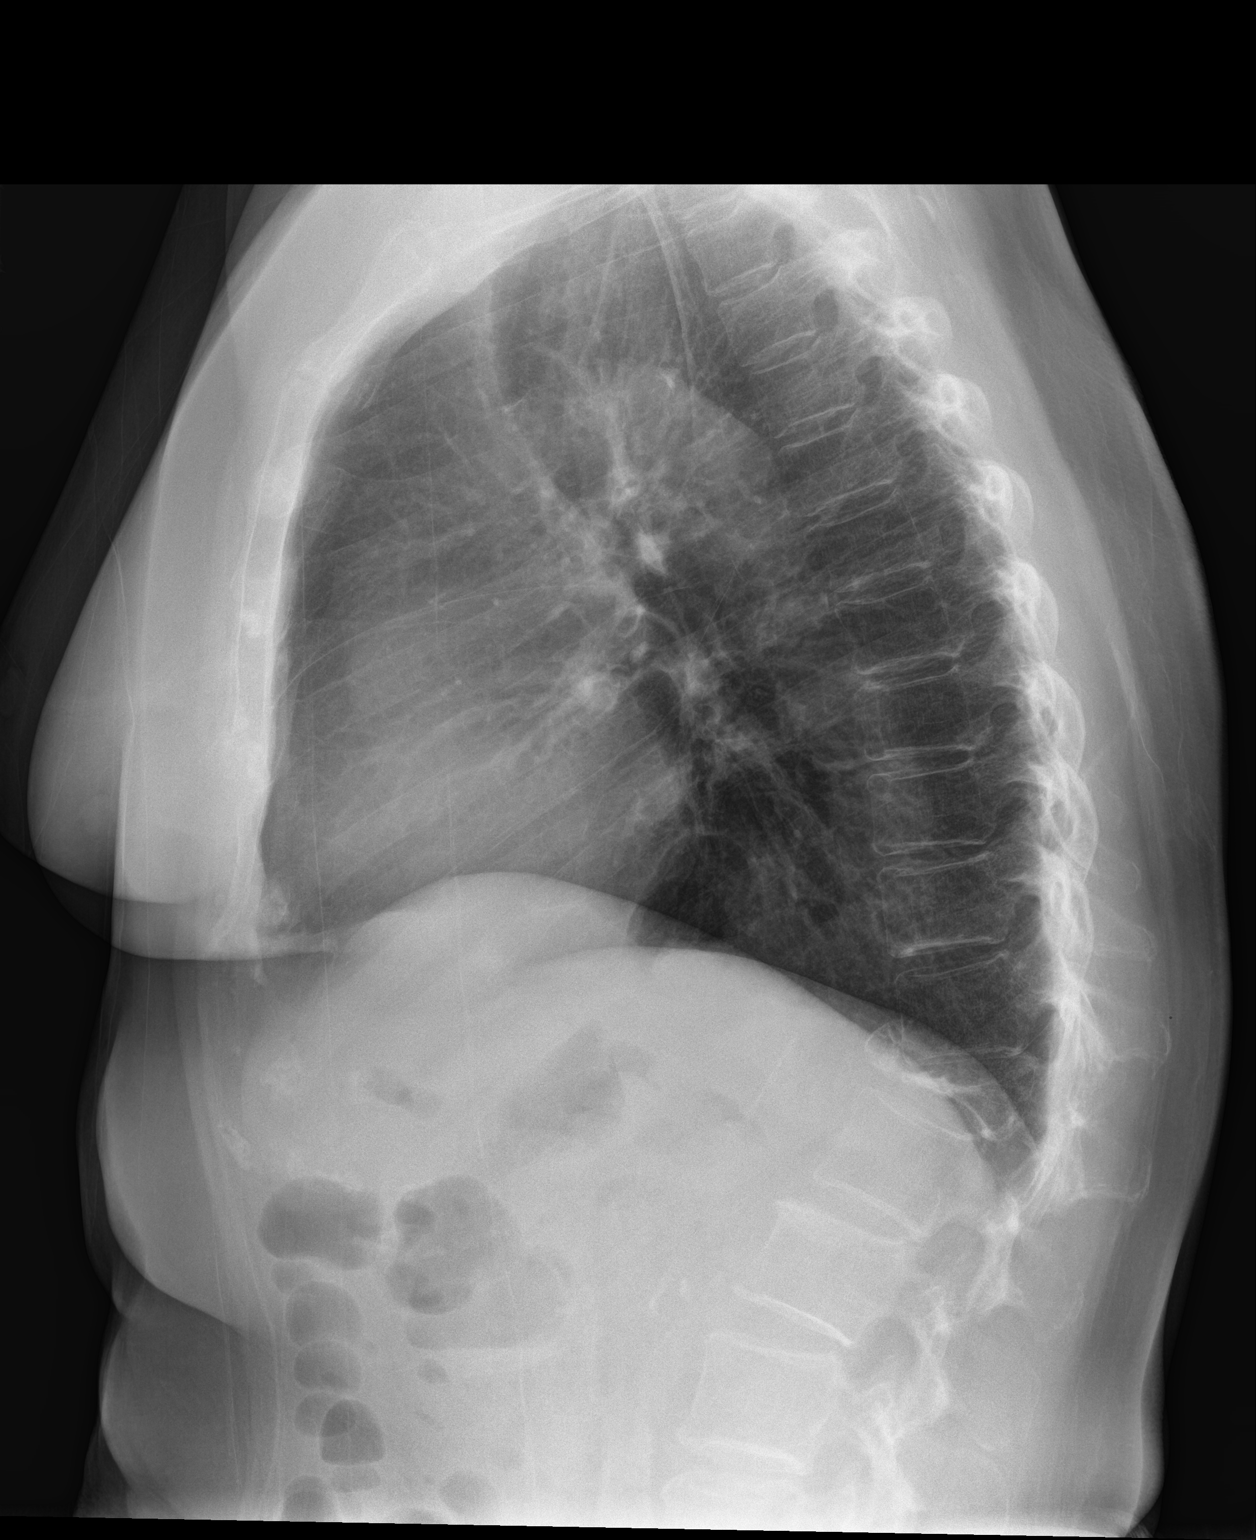

[2 of 2 positions shown; findings below may reference images not displayed]

FINDINGS: The cardiac silhouette is normal in size. There is a small hiatal
hernia. Mediastinum is otherwise normal in contour and caliber. No
hilar masses or adenopathy.

Minor scarring noted at the lung apices. Lungs are otherwise clear.
No pleural effusion or pneumothorax.

There is a marked compression fracture at the thoracolumbar
junction, most likely chronic. Bony thorax is demineralized. There
is an old fracture of the proximal right humerus.
IMPRESSION: No acute cardiopulmonary disease.

## 2017-01-15 DIAGNOSIS — H2512 Age-related nuclear cataract, left eye: Secondary | ICD-10-CM | POA: Diagnosis not present

## 2017-01-20 ENCOUNTER — Encounter: Payer: Self-pay | Admitting: *Deleted

## 2017-02-01 ENCOUNTER — Other Ambulatory Visit: Payer: Self-pay | Admitting: Family Medicine

## 2017-02-01 DIAGNOSIS — I10 Essential (primary) hypertension: Secondary | ICD-10-CM

## 2017-02-03 ENCOUNTER — Ambulatory Visit: Payer: Medicare HMO | Admitting: Anesthesiology

## 2017-02-03 ENCOUNTER — Encounter
Admission: RE | Disposition: A | Discharge status: Home / Self Care | Payer: Self-pay | Source: Ambulatory Visit | Attending: Ophthalmology

## 2017-02-03 ENCOUNTER — Ambulatory Visit
Admission: RE | Admit: 2017-02-03 | Discharge: 2017-02-03 | Disposition: A | Discharge status: Home / Self Care | Payer: Medicare HMO | Source: Ambulatory Visit | Attending: Ophthalmology | Admitting: Ophthalmology

## 2017-02-03 ENCOUNTER — Encounter: Payer: Self-pay | Admitting: *Deleted

## 2017-02-03 DIAGNOSIS — G8929 Other chronic pain: Secondary | ICD-10-CM | POA: Diagnosis not present

## 2017-02-03 DIAGNOSIS — H2512 Age-related nuclear cataract, left eye: Secondary | ICD-10-CM | POA: Diagnosis not present

## 2017-02-03 DIAGNOSIS — M479 Spondylosis, unspecified: Secondary | ICD-10-CM | POA: Diagnosis not present

## 2017-02-03 DIAGNOSIS — M549 Dorsalgia, unspecified: Secondary | ICD-10-CM | POA: Insufficient documentation

## 2017-02-03 DIAGNOSIS — Z79899 Other long term (current) drug therapy: Secondary | ICD-10-CM | POA: Insufficient documentation

## 2017-02-03 DIAGNOSIS — I10 Essential (primary) hypertension: Secondary | ICD-10-CM | POA: Insufficient documentation

## 2017-02-03 DIAGNOSIS — Z853 Personal history of malignant neoplasm of breast: Secondary | ICD-10-CM | POA: Insufficient documentation

## 2017-02-03 DIAGNOSIS — Z85828 Personal history of other malignant neoplasm of skin: Secondary | ICD-10-CM | POA: Insufficient documentation

## 2017-02-03 HISTORY — PX: CATARACT EXTRACTION W/PHACO: SHX586

## 2017-02-03 SURGERY — PHACOEMULSIFICATION, CATARACT, WITH IOL INSERTION
Anesthesia: Monitor Anesthesia Care | Site: Eye | Laterality: Left | Wound class: Clean

## 2017-02-03 MED ORDER — EPINEPHRINE PF 1 MG/ML IJ SOLN
INTRAOCULAR | Status: DC | PRN
  Administered 2017-02-03: 1 mL via OPHTHALMIC

## 2017-02-03 MED ORDER — EPINEPHRINE PF 1 MG/ML IJ SOLN
INTRAMUSCULAR | Status: AC
  Filled 2017-02-03: qty 1

## 2017-02-03 MED ORDER — ARMC OPHTHALMIC DILATING DROPS
OPHTHALMIC | Status: AC
  Administered 2017-02-03: 1 via OPHTHALMIC
  Filled 2017-02-03: qty 0.4

## 2017-02-03 MED ORDER — MOXIFLOXACIN HCL 0.5 % OP SOLN
OPHTHALMIC | Status: DC | PRN
  Administered 2017-02-03: .2 mL via OPHTHALMIC

## 2017-02-03 MED ORDER — LIDOCAINE HCL (PF) 4 % IJ SOLN
INTRAOCULAR | Status: DC | PRN
  Administered 2017-02-03: 2 mL via OPHTHALMIC

## 2017-02-03 MED ORDER — SODIUM CHLORIDE 0.9 % IV SOLN
INTRAVENOUS | Status: DC
  Administered 2017-02-03 (×2): via INTRAVENOUS

## 2017-02-03 MED ORDER — POVIDONE-IODINE 5 % OP SOLN
OPHTHALMIC | Status: DC | PRN
  Administered 2017-02-03: 1 via OPHTHALMIC

## 2017-02-03 MED ORDER — MOXIFLOXACIN HCL 0.5 % OP SOLN: 1.0000 [drp] | OPHTHALMIC | Status: DC | PRN

## 2017-02-03 MED ORDER — ARMC OPHTHALMIC DILATING DROPS
1.0000 "application " | OPHTHALMIC | Status: AC
  Administered 2017-02-03 (×3): 1 via OPHTHALMIC

## 2017-02-03 MED ORDER — NA CHONDROIT SULF-NA HYALURON 40-17 MG/ML IO SOLN
INTRAOCULAR | Status: AC
  Filled 2017-02-03: qty 1

## 2017-02-03 MED ORDER — LIDOCAINE HCL (PF) 4 % IJ SOLN
INTRAMUSCULAR | Status: AC
  Filled 2017-02-03: qty 5

## 2017-02-03 MED ORDER — MOXIFLOXACIN HCL 0.5 % OP SOLN
OPHTHALMIC | Status: AC
  Filled 2017-02-03: qty 3

## 2017-02-03 MED ORDER — NA CHONDROIT SULF-NA HYALURON 40-17 MG/ML IO SOLN
INTRAOCULAR | Status: DC | PRN
  Administered 2017-02-03: 1 mL via INTRAOCULAR

## 2017-02-03 MED ORDER — MIDAZOLAM HCL 2 MG/2ML IJ SOLN
INTRAMUSCULAR | Status: AC
  Filled 2017-02-03: qty 2

## 2017-02-03 MED ORDER — MIDAZOLAM HCL 2 MG/2ML IJ SOLN
INTRAMUSCULAR | Status: DC | PRN
  Administered 2017-02-03: 0.5 mg via INTRAVENOUS

## 2017-02-03 MED ORDER — POVIDONE-IODINE 5 % OP SOLN
OPHTHALMIC | Status: AC
Start: 2017-02-03 — End: 2017-02-03
  Filled 2017-02-03: qty 30

## 2017-02-03 MED ORDER — CARBACHOL 0.01 % IO SOLN
INTRAOCULAR | Status: DC | PRN
  Administered 2017-02-03: .5 mL via INTRAOCULAR

## 2017-02-03 SURGICAL SUPPLY — 16 items
GLOVE BIO SURGEON STRL SZ8 (GLOVE) ×2 IMPLANT
GLOVE BIOGEL M 6.5 STRL (GLOVE) ×2 IMPLANT
GLOVE SURG LX 8.0 MICRO (GLOVE) ×1
GLOVE SURG LX STRL 8.0 MICRO (GLOVE) ×1 IMPLANT
GOWN STRL REUS W/ TWL LRG LVL3 (GOWN DISPOSABLE) ×2 IMPLANT
GOWN STRL REUS W/TWL LRG LVL3 (GOWN DISPOSABLE) ×2
LABEL CATARACT MEDS ST (LABEL) ×2 IMPLANT
LENS IOL TECNIS ITEC 21.5 (Intraocular Lens) ×2 IMPLANT
PACK CATARACT (MISCELLANEOUS) ×2 IMPLANT
PACK CATARACT BRASINGTON LX (MISCELLANEOUS) ×2 IMPLANT
PACK EYE AFTER SURG (MISCELLANEOUS) ×2 IMPLANT
SOL BSS BAG (MISCELLANEOUS) ×2
SOLUTION BSS BAG (MISCELLANEOUS) ×1 IMPLANT
SYR 5ML LL (SYRINGE) ×2 IMPLANT
WATER STERILE IRR 250ML POUR (IV SOLUTION) ×2 IMPLANT
WIPE NON LINTING 3.25X3.25 (MISCELLANEOUS) ×2 IMPLANT

## 2017-02-03 NOTE — Discharge Instructions (Signed)
Eye Surgery Discharge Instructions  Expect mild scratchy sensation or mild soreness. DO NOT RUB YOUR EYE!  The day of surgery:  Minimal physical activity, but bed rest is not required  No reading, computer work, or close hand work  No bending, lifting, or straining.  May watch TV  For 24 hours:  No driving, legal decisions, or alcoholic beverages  Safety precautions  Eat anything you prefer: It is better to start with liquids, then soup then solid foods.  _____ Eye patch should be worn until postoperative exam tomorrow.  ____ Solar shield eyeglasses should be worn for comfort in the sunlight/patch while sleeping  Resume all regular medications including aspirin or Coumadin if these were discontinued prior to surgery. You may shower, bathe, shave, or wash your hair. Tylenol may be taken for mild discomfort.  Call your doctor if you experience significant pain, nausea, or vomiting, fever > 101 or other signs of infection. [phone] or [phone] Specific instructions:  Follow-up Information    Birder Robson, MD Follow up on 02/03/2017.   Specialty:  Ophthalmology Why:  2:45 Contact information: [address] [place] [address] [phone]

## 2017-02-03 NOTE — Anesthesia Preprocedure Evaluation (Signed)
Anesthesia Evaluation  Patient identified by MRN, date of birth, ID band Patient awake    Reviewed: Allergy & Precautions, H&P , NPO status , Patient's Chart, lab work & pertinent test results, reviewed documented beta blocker date and time   History of Anesthesia Complications Negative for: history of anesthetic complications  Airway Mallampati: I  TM Distance: >3 FB Neck ROM: full    Dental  (+) Missing, Dental Advidsory Given, Edentulous Upper   Pulmonary neg pulmonary ROS,           Cardiovascular Exercise Tolerance: Good hypertension, (-) angina(-) CAD, (-) Past MI, (-) Cardiac Stents and (-) CABG + dysrhythmias (-) Valvular Problems/Murmurs     Neuro/Psych  Headaches, neg Seizures negative psych ROS   GI/Hepatic negative GI ROS, Neg liver ROS,   Endo/Other  negative endocrine ROS  Renal/GU negative Renal ROS  negative genitourinary   Musculoskeletal   Abdominal   Peds  Hematology negative hematology ROS (+)   Anesthesia Other Findings Past Medical History: No date: Allergy No date: Anemia     Comment:  H/O AS A CHILD No date: Arthritis No date: Back pain No date: Cancer [facility])     Comment:  BREAST 05/28/2013: Collar bone fracture     Comment:  Fractured Collar bone on left No date: Dysrhythmia     Comment:  h/o heart skipping beat years ago No date: Frequent headaches     Comment:  h/o migraines No date: Hypertension No date: Osteoporosis   Reproductive/Obstetrics negative OB ROS                             Anesthesia Physical Anesthesia Plan  ASA: II  Anesthesia Plan: MAC   Post-op Pain Management:    Induction:   PONV Risk Score and Plan:   Airway Management Planned: Natural Airway and Nasal Cannula  Additional Equipment:   Intra-op Plan:   Post-operative Plan:   Informed Consent: I have reviewed the patients History and Physical, chart, labs and discussed  the procedure including the risks, benefits and alternatives for the proposed anesthesia with the patient or authorized representative who has indicated his/her understanding and acceptance.   Dental Advisory Given  Plan Discussed with: Anesthesiologist, CRNA and Surgeon  Anesthesia Plan Comments:         Anesthesia Quick Evaluation

## 2017-02-03 NOTE — H&P (Signed)
All labs reviewed. Abnormal studies sent to patients PCP when indicated.  Previous H&P reviewed, patient examined, there are NO CHANGES.  Kristin Miller LOUIS7/17/201810:38 AM

## 2017-02-03 NOTE — Transfer of Care (Signed)
Immediate Anesthesia Transfer of Care Note  Patient: Kristin Miller  Procedure(s) Performed: Procedure(s) with comments: CATARACT EXTRACTION PHACO AND INTRAOCULAR LENS PLACEMENT (IOC) (Left) - [place]  00:28 AP% 16.3 CDE 4.68 Fluid pack lot # [id number]  Patient Location: PACU and Short Stay  Anesthesia Type:MAC  Level of Consciousness: awake, alert , oriented and patient cooperative  Airway & Oxygen Therapy: Patient Spontanous Breathing  Post-op Assessment: Report given to RN and Post -op Vital signs reviewed and stable  Post vital signs: Reviewed  Last Vitals:  Vitals:   02/03/17 0918  BP: 137/62  Pulse: 85  Resp: 16  Temp: (!) 36.2 C    Last Pain:  Vitals:   02/03/17 0918  TempSrc: Oral         Complications: No apparent anesthesia complications

## 2017-02-03 NOTE — Op Note (Signed)
PREOPERATIVE DIAGNOSIS:  Nuclear sclerotic cataract of the left eye.   POSTOPERATIVE DIAGNOSIS:  Nuclear sclerotic cataract of the left eye.   OPERATIVE PROCEDURE: Procedure(s): CATARACT EXTRACTION PHACO AND INTRAOCULAR LENS PLACEMENT (IOC)   SURGEON:  Birder Robson, MD.   ANESTHESIA:  Anesthesiologist: Martha Clan, MD CRNA: Courtney Paris, CRNA  1.      Managed anesthesia care. 2.     0.68ml of Shugarcaine was instilled following the paracentesis   COMPLICATIONS:  None.   TECHNIQUE:   Stop and chop   DESCRIPTION OF PROCEDURE:  The patient was examined and consented in the preoperative holding area where the aforementioned topical anesthesia was applied to the left eye and then brought back to the Operating Room where the left eye was prepped and draped in the usual sterile ophthalmic fashion and a lid speculum was placed. A paracentesis was created with the side port blade and the anterior chamber was filled with viscoelastic. A near clear corneal incision was performed with the steel keratome. A continuous curvilinear capsulorrhexis was performed with a cystotome followed by the capsulorrhexis forceps. Hydrodissection and hydrodelineation were carried out with BSS on a blunt cannula. The lens was removed in a stop and chop  technique and the remaining cortical material was removed with the irrigation-aspiration handpiece. The capsular bag was inflated with viscoelastic and the Technis [id number] lens was placed in the capsular bag without complication. The remaining viscoelastic was removed from the eye with the irrigation-aspiration handpiece. The wounds were hydrated. The anterior chamber was flushed with Miostat and the eye was inflated to physiologic pressure. 0.13ml Vigamox was placed in the anterior chamber. The wounds were found to be water tight. The eye was dressed with Vigamox. The patient was given protective glasses to wear throughout the day and a shield with which to sleep  tonight. The patient was also given drops with which to begin a drop regimen today and will follow-up with me in one day.  Implant Name Type Inv. Item Serial No. Manufacturer Lot No. LRB No. Used  LENS IOL DIOP 21.5 - [id number] Intraocular Lens LENS IOL DIOP 21.5 [id number] AMO   Left 1    Procedure(s) with comments: CATARACT EXTRACTION PHACO AND INTRAOCULAR LENS PLACEMENT (IOC) (Left) - [place]  00:28 AP% 16.3 CDE 4.68 Fluid pack lot # [id number]  Electronically signed: [place] 02/03/2017 11:04 AM

## 2017-02-03 NOTE — Anesthesia Postprocedure Evaluation (Signed)
Anesthesia Post Note  Patient: Kristin Miller  Procedure(s) Performed: Procedure(s) (LRB): CATARACT EXTRACTION PHACO AND INTRAOCULAR LENS PLACEMENT (IOC) (Left)  Patient location during evaluation: PACU Anesthesia Type: MAC Level of consciousness: awake and alert Pain management: pain level controlled Vital Signs Assessment: post-procedure vital signs reviewed and stable Respiratory status: spontaneous breathing, nonlabored ventilation, respiratory function stable and patient connected to nasal cannula oxygen Cardiovascular status: stable and blood pressure returned to baseline Anesthetic complications: no     Last Vitals:  Vitals:   02/03/17 1108 02/03/17 1120  BP: 114/61 (!) 108/49  Pulse: 71 63  Resp:  16  Temp: 36.8 C     Last Pain:  Vitals:   02/03/17 1108  TempSrc: Oral                 Martha Clan

## 2017-02-03 NOTE — Anesthesia Post-op Follow-up Note (Cosign Needed)
Anesthesia QCDR form completed.        

## 2017-02-04 ENCOUNTER — Encounter: Payer: Self-pay | Admitting: Ophthalmology

## 2017-02-12 ENCOUNTER — Ambulatory Visit (INDEPENDENT_AMBULATORY_CARE_PROVIDER_SITE_OTHER): Payer: Medicare HMO | Admitting: Family Medicine

## 2017-02-12 ENCOUNTER — Encounter: Payer: Self-pay | Admitting: Family Medicine

## 2017-02-12 VITALS — BP 120/70 | HR 72 | Ht 61.0 in | Wt 148.0 lb

## 2017-02-12 DIAGNOSIS — Z23 Encounter for immunization: Secondary | ICD-10-CM | POA: Diagnosis not present

## 2017-02-12 DIAGNOSIS — I10 Essential (primary) hypertension: Secondary | ICD-10-CM | POA: Diagnosis not present

## 2017-02-12 MED ORDER — LISINOPRIL-HYDROCHLOROTHIAZIDE 20-12.5 MG PO TABS: 1.0000 | ORAL_TABLET | Freq: Every day | ORAL | 1 refills | Status: DC

## 2017-02-12 NOTE — Progress Notes (Signed)
Name: Kristin Miller   MRN: [id number]    DOB: [birth year]   Date:02/12/2017       Progress Note  Subjective  Chief Complaint  Chief Complaint  Patient presents with  . Hypertension    Hypertension  This is a chronic problem. The current episode started more than 1 year ago. The problem is unchanged. The problem is controlled. Associated symptoms include blurred vision. Pertinent negatives include no anxiety, chest pain, headaches, malaise/fatigue, neck pain, orthopnea, palpitations, peripheral edema, PND, shortness of breath or sweats. (Recent cataract surgery) There are no associated agents to hypertension. Past treatments include ACE inhibitors and diuretics. The current treatment provides moderate improvement. There are no compliance problems.  There is no history of angina, kidney disease, CAD/MI, CVA, heart failure, left ventricular hypertrophy, PVD or retinopathy. There is no history of chronic renal disease, a hypertension causing med or renovascular disease.    No problem-specific Assessment & Plan notes found for this encounter.   Past Medical History:  Diagnosis Date  . Allergy   . Anemia    H/O AS A CHILD  . Arthritis   . Back pain   . Cancer (HCC)    BREAST  . Collar bone fracture 05/28/2013   Fractured Collar bone on left  . Dysrhythmia    h/o heart skipping beat years ago  . Frequent headaches    h/o migraines  . Hypertension   . Osteoporosis     Past Surgical History:  Procedure Laterality Date  . ABDOMINAL HYSTERECTOMY  1970   Partial  . BREAST BIOPSY Right 05/28/2015   INVASIVE MAMMARY CARCINOMA   . BREAST BIOPSY Right 06/12/2015   Wide excision, sentinel lymph node biopsy.  Marland Kitchen BREAST LUMPECTOMY WITH AXILLARY LYMPH NODE DISSECTION Right 06/12/2015        . CATARACT EXTRACTION W/PHACO Left 02/03/2017   Procedure: CATARACT EXTRACTION PHACO AND INTRAOCULAR LENS PLACEMENT (IOC);  Surgeon: Birder Robson, MD;  Location: [facility] ORS;  Service: Ophthalmology;   Laterality: Left;  [place]  00:28 AP% 16.3 CDE 4.68 Fluid pack lot # [id number]  . COLONOSCOPY      Family History  Problem Relation Age of Onset  . Mental illness Mother        Dementia  . Stroke Father   . Cancer Brother        Lung Cancer    Social History   Social History  . Marital status: Married    Spouse name: N/A  . Number of children: N/A  . Years of education: N/A   Occupational History  . Not on file.   Social History Main Topics  . Smoking status: Never Smoker  . Smokeless tobacco: Never Used  . Alcohol use No  . Drug use: No  . Sexual activity: Not Currently   Other Topics Concern  . Not on file   Social History Narrative  . No narrative on file    Allergies  Allergen Reactions  . Other Rash    SUTURES-(PARTIAL HYSTERECTOMY)    Outpatient Medications Prior to Visit  Medication Sig Dispense Refill  . acetaminophen (TYLENOL) 500 MG tablet Take 500 mg by mouth every 6 (six) hours as needed for pain.    . Calcium-Vitamin D (CALTRATE 600 PLUS-VIT D PO) Take 2 tablets by mouth daily.    . cetirizine (ZYRTEC) 10 MG tablet Take 10 mg by mouth as needed.     Marland Kitchen letrozole (FEMARA) 2.5 MG tablet TAKE ONE TABLET BY MOUTH ONCE DAILY 30  tablet 12  . Multiple Vitamins-Minerals (CENTRUM SILVER ULTRA WOMENS PO) Take 1 tablet by mouth daily.    Marland Kitchen lisinopril-hydrochlorothiazide (PRINZIDE,ZESTORETIC) 20-12.5 MG tablet TAKE ONE TABLET BY MOUTH ONCE DAILY 30 tablet 0  . amoxicillin-clavulanate (AUGMENTIN) 875-125 MG tablet Take 1 tablet by mouth 2 (two) times daily. (Patient not taking: Reported on 01/20/2017) 20 tablet 0  . guaiFENesin-codeine (ROBITUSSIN AC) 100-10 MG/5ML syrup Take 5 mLs by mouth 3 (three) times daily as needed for cough. (Patient not taking: Reported on 01/20/2017) 150 mL 0   No facility-administered medications prior to visit.     Review of Systems  Constitutional: Negative for chills, fever, malaise/fatigue and weight loss.  HENT: Negative for ear  discharge, ear pain and sore throat.   Eyes: Positive for blurred vision and pain. Negative for photophobia, discharge and redness.  Respiratory: Negative for cough, sputum production, shortness of breath and wheezing.   Cardiovascular: Negative for chest pain, palpitations, orthopnea, leg swelling and PND.  Gastrointestinal: Negative for abdominal pain, blood in stool, constipation, diarrhea, heartburn, melena and nausea.  Genitourinary: Negative for dysuria, frequency, hematuria and urgency.  Musculoskeletal: Negative for back pain, joint pain, myalgias and neck pain.  Skin: Negative for rash.  Neurological: Negative for dizziness, tingling, sensory change, focal weakness and headaches.  Endo/Heme/Allergies: Negative for environmental allergies and polydipsia. Does not bruise/bleed easily.  Psychiatric/Behavioral: Negative for depression and suicidal ideas. The patient is not nervous/anxious and does not have insomnia.      Objective  Vitals:   02/12/17 1339  BP: 120/70  Pulse: 72  Weight: 148 lb (67.1 kg)  Height: 5\' 1"  (1.549 m)    Physical Exam  Constitutional: She is well-developed, well-nourished, and in no distress. No distress.  HENT:  Head: Normocephalic and atraumatic.  Right Ear: External ear normal.  Left Ear: External ear normal.  Nose: Nose normal.  Mouth/Throat: Oropharynx is clear and moist.  Eyes: Pupils are equal, round, and reactive to light. Conjunctivae and EOM are normal. Right eye exhibits no discharge. Left eye exhibits no discharge.  Neck: Normal range of motion. Neck supple. No JVD present. No thyromegaly present.  Cardiovascular: Normal rate, regular rhythm, normal heart sounds and intact distal pulses.  Exam reveals no gallop and no friction rub.   No murmur heard. Pulmonary/Chest: Effort normal and breath sounds normal. She has no wheezes. She has no rales.  Abdominal: Soft. Bowel sounds are normal. She exhibits no distension and no mass. There is no  tenderness. There is no rebound and no guarding.  Musculoskeletal: Normal range of motion. She exhibits no edema.  Lymphadenopathy:    She has no cervical adenopathy.  Neurological: She is alert. She has normal reflexes.  Skin: Skin is warm and dry. She is not diaphoretic.  Psychiatric: Mood and affect normal.  Nursing note and vitals reviewed.     Assessment & Plan  Problem List Items Addressed This Visit      Cardiovascular and Mediastinum   BP (high blood pressure)   Relevant Medications   lisinopril-hydrochlorothiazide (PRINZIDE,ZESTORETIC) 20-12.5 MG tablet   Other Relevant Orders   Renal Function Panel    Other Visit Diagnoses    Need for 23-polyvalent pneumococcal polysaccharide vaccine    -  Primary   Relevant Orders   Pneumococcal polysaccharide vaccine 23-valent greater than or equal to 2yo subcutaneous/IM (Completed)      Meds ordered this encounter  Medications  . lisinopril-hydrochlorothiazide (PRINZIDE,ZESTORETIC) 20-12.5 MG tablet    Sig: Take 1 tablet  by mouth daily.    Dispense:  90 tablet    Refill:  1    sched appt for meds      Dr. Otilio Miu [facility]  02/12/17

## 2017-02-13 LAB — RENAL FUNCTION PANEL
Albumin: 4.4 g/dL (ref 3.5–4.8)
BUN / CREAT RATIO: 17 (ref 12–28)
BUN: 16 mg/dL (ref 8–27)
CALCIUM: 9.7 mg/dL (ref 8.7–10.3)
CO2: 28 mmol/L (ref 20–29)
CREATININE: 0.92 mg/dL (ref 0.57–1.00)
Chloride: 101 mmol/L (ref 96–106)
GFR calc Af Amer: 69 mL/min/{1.73_m2} (ref >59)
GFR, EST NON AFRICAN AMERICAN: 60 mL/min/{1.73_m2} (ref >59)
GLUCOSE: 94 mg/dL (ref 65–99)
PHOSPHORUS: 3.8 mg/dL (ref 2.5–4.5)
POTASSIUM: 4.1 mmol/L (ref 3.5–5.2)
SODIUM: 142 mmol/L (ref 134–144)

## 2017-03-19 ENCOUNTER — Other Ambulatory Visit: Payer: Self-pay

## 2017-03-19 DIAGNOSIS — C50511 Malignant neoplasm of lower-outer quadrant of right female breast: Secondary | ICD-10-CM

## 2017-03-19 DIAGNOSIS — Z170 Estrogen receptor positive status [ER+]: Principal | ICD-10-CM

## 2017-05-04 ENCOUNTER — Ambulatory Visit
Admission: RE | Admit: 2017-05-04 | Discharge: 2017-05-04 | Disposition: A | Discharge status: Home / Self Care | Payer: Medicare HMO | Source: Ambulatory Visit | Attending: General Surgery | Admitting: General Surgery

## 2017-05-04 DIAGNOSIS — C50511 Malignant neoplasm of lower-outer quadrant of right female breast: Secondary | ICD-10-CM

## 2017-05-04 DIAGNOSIS — Z170 Estrogen receptor positive status [ER+]: Principal | ICD-10-CM

## 2017-05-04 DIAGNOSIS — R922 Inconclusive mammogram: Secondary | ICD-10-CM | POA: Diagnosis not present

## 2017-05-12 ENCOUNTER — Ambulatory Visit: Payer: Self-pay | Admitting: General Surgery

## 2017-05-18 ENCOUNTER — Encounter: Payer: Self-pay | Admitting: General Surgery

## 2017-05-18 ENCOUNTER — Ambulatory Visit (INDEPENDENT_AMBULATORY_CARE_PROVIDER_SITE_OTHER): Payer: Medicare HMO | Admitting: General Surgery

## 2017-05-18 VITALS — BP 128/70 | HR 98 | Resp 12 | Ht 64.0 in | Wt 146.0 lb

## 2017-05-18 DIAGNOSIS — C50511 Malignant neoplasm of lower-outer quadrant of right female breast: Secondary | ICD-10-CM

## 2017-05-18 NOTE — Progress Notes (Signed)
Patient ID: Kristin Miller, female   DOB: 1936/05/18, 81 y.o.   MRN: [id number]  Chief Complaint  Patient presents with  . Follow-up    HPI CHIRSTINE Miller is a 81 y.o. female who presents for a breast evaluation. The most recent mammogram was done on 05/04/2017.  Patient does perform regular self breast checks and gets regular mammograms done.  Patient states she had trouble with her  fingers swelling on her left hand.   HPI  Past Medical History:  Diagnosis Date  . Allergy   . Anemia    H/O AS A CHILD  . Arthritis   . Back pain   . Breast cancer of upper-outer quadrant of right female breast ([place]) 06/12/2015   4 millimeter, T1a, N0; ER 90%, PR 0%, HER-2/neu not overexpressed.  . Collar bone fracture 05/28/2013   Fractured Collar bone on left  . Dysrhythmia    h/o heart skipping beat years ago  . Frequent headaches    h/o migraines  . Hypertension   . Osteoporosis     Past Surgical History:  Procedure Laterality Date  . ABDOMINAL HYSTERECTOMY  1970   Partial  . BREAST BIOPSY Right 05/28/2015   INVASIVE MAMMARY CARCINOMA   . BREAST BIOPSY Right 06/12/2015   Wide excision, sentinel lymph node biopsy.  Marland Kitchen BREAST LUMPECTOMY Right 2016  . BREAST LUMPECTOMY WITH AXILLARY LYMPH NODE DISSECTION Right 06/12/2015        . CATARACT EXTRACTION W/PHACO Left 02/03/2017   Procedure: CATARACT EXTRACTION PHACO AND INTRAOCULAR LENS PLACEMENT (IOC);  Surgeon: Birder Robson, MD;  Location: [facility] ORS;  Service: Ophthalmology;  Laterality: Left;  [place]  00:28 AP% 16.3 CDE 4.68 Fluid pack lot # [id number]  . COLONOSCOPY      Family History  Problem Relation Age of Onset  . Mental illness Mother        Dementia  . Stroke Father   . Cancer Brother        Lung Cancer  . Breast cancer Other     Social History Social History  Substance Use Topics  . Smoking status: Never Smoker  . Smokeless tobacco: Never Used  . Alcohol use No    Allergies  Allergen Reactions  . Other Rash     SUTURES-(PARTIAL HYSTERECTOMY)    Current Outpatient Prescriptions  Medication Sig Dispense Refill  . acetaminophen (TYLENOL) 500 MG tablet Take 500 mg by mouth every 6 (six) hours as needed for pain.    . Calcium-Vitamin D (CALTRATE 600 PLUS-VIT D PO) Take 2 tablets by mouth daily.    . cetirizine (ZYRTEC) 10 MG tablet Take 10 mg by mouth as needed.     Marland Kitchen letrozole (FEMARA) 2.5 MG tablet TAKE ONE TABLET BY MOUTH ONCE DAILY 30 tablet 12  . lisinopril-hydrochlorothiazide (PRINZIDE,ZESTORETIC) 20-12.5 MG tablet Take 1 tablet by mouth daily. 90 tablet 1  . Multiple Vitamins-Minerals (CENTRUM SILVER ULTRA WOMENS PO) Take 1 tablet by mouth daily.     No current facility-administered medications for this visit.     Review of Systems Review of Systems  Blood pressure 128/70, pulse 98, resp. rate 12, height '5\' 4"'  (1.626 m), weight 146 lb (66.2 kg).  Physical Exam Physical Exam  Constitutional: She is oriented to person, place, and time. She appears well-developed and well-nourished.  Eyes: Conjunctivae are normal. No scleral icterus.  Neck: Neck supple.  Cardiovascular: Normal rate, regular rhythm and normal heart sounds.   Pulmonary/Chest: Effort normal and breath sounds normal. Right  breast exhibits no inverted nipple, no mass, no nipple discharge, no skin change and no tenderness. Left breast exhibits no inverted nipple, no mass, no nipple discharge, no skin change and no tenderness.    Lymphadenopathy:    She has no cervical adenopathy.    She has no axillary adenopathy.  Neurological: She is alert and oriented to person, place, and time.  Skin: Skin is warm and dry.    Data Reviewed May 04, 2017 bilateral diagnostic mammograms reviewed.  Postsurgical changes. BIRAD 2.  I received notification from the patient's insurance company there was a question of whether she had been filling her prescription.  In conversation with her she reports that she is taking her medication  daily.  Assessment    No evidence of recurrent cancer.    Plan        The patient has been asked to return to the office in one year with a bilateral diagnostic mammogram 3-D.  The patient is aware to call back for any questions or concerns.  HPI, Physical Exam, Assessment and Plan have been scribed under the direction and in the presence of Hervey Ard, MD.  Gaspar Cola, CMA  I have completed the exam and reviewed the above documentation for accuracy and completeness.  I agree with the above.  Haematologist has been used and any errors in dictation or transcription are unintentional.  Hervey Ard, M.D., F.A.C.S.   Robert Bellow 05/19/2017, 9:46 AM

## 2017-05-18 NOTE — Patient Instructions (Signed)
The patient has been asked to return to the office in one year with a bilateral diagnostic mammogram.The patient is aware to call back for any questions or concerns. 

## 2017-05-19 ENCOUNTER — Encounter: Payer: Self-pay | Admitting: General Surgery

## 2017-05-20 ENCOUNTER — Telehealth: Payer: Self-pay | Admitting: *Deleted

## 2017-05-20 NOTE — Telephone Encounter (Signed)
Patient called because she stated that she got a call on her answering machine this week from [place]. Patient called them back and they gave her a run a around and never got the answer she was looking for. She stated that the message was regarding her medication Letrozole, stating that her refill was overdue. Patient was very worried that it was a scam call due to her not using [facility] for her prescriptions, she uses [facility] in [place]. I called [facility] for the patient to get the information clarified. I talked with Missy and she stated that they have a program called Outreach program that is contracted through [employer], which is patients insurance, to let the patient know that either they are overdue for getting refills on their medication or behind on picking those up from their local pharmacy. I contacted the patient back once I talked with them and let the patient know what was going on. Patient was very pleased and relieved that it was not a scam call and that it was taken care of.

## 2017-07-30 ENCOUNTER — Ambulatory Visit (INDEPENDENT_AMBULATORY_CARE_PROVIDER_SITE_OTHER): Payer: Medicare HMO

## 2017-07-30 ENCOUNTER — Ambulatory Visit (INDEPENDENT_AMBULATORY_CARE_PROVIDER_SITE_OTHER): Payer: Medicare HMO | Admitting: Family Medicine

## 2017-07-30 ENCOUNTER — Encounter: Payer: Self-pay | Admitting: Family Medicine

## 2017-07-30 VITALS — BP 130/80 | HR 72 | Ht 64.0 in | Wt 146.0 lb

## 2017-07-30 VITALS — BP 142/80 | HR 88 | Temp 98.5°F | Resp 16 | Ht 64.0 in | Wt 146.4 lb

## 2017-07-30 DIAGNOSIS — I10 Essential (primary) hypertension: Secondary | ICD-10-CM | POA: Diagnosis not present

## 2017-07-30 DIAGNOSIS — M19042 Primary osteoarthritis, left hand: Secondary | ICD-10-CM

## 2017-07-30 DIAGNOSIS — Z0000 Encounter for general adult medical examination without abnormal findings: Secondary | ICD-10-CM

## 2017-07-30 DIAGNOSIS — Z23 Encounter for immunization: Secondary | ICD-10-CM

## 2017-07-30 DIAGNOSIS — M19041 Primary osteoarthritis, right hand: Secondary | ICD-10-CM | POA: Diagnosis not present

## 2017-07-30 NOTE — Progress Notes (Signed)
Name: Kristin Miller   MRN: [id number]    DOB: 09/11/1935   Date:07/30/2017       Progress Note  Subjective  Chief Complaint  Chief Complaint  Patient presents with  . Hypertension    Hypertension  This is a chronic problem. The current episode started more than 1 year ago. The problem has been gradually improving since onset. The problem is controlled. Pertinent negatives include no blurred vision, chest pain, headaches, malaise/fatigue, neck pain, palpitations, peripheral edema, PND or shortness of breath. There are no associated agents to hypertension. Risk factors for coronary artery disease include dyslipidemia. Past treatments include ACE inhibitors and diuretics. The current treatment provides moderate improvement. There are no compliance problems.  There is no history of angina, kidney disease, CAD/MI, CVA, heart failure, left ventricular hypertrophy, PVD or retinopathy. There is no history of chronic renal disease, a hypertension causing med or renovascular disease.  Hand Pain   The incident occurred more than 1 week ago. There was no injury mechanism. The pain is present in the right fingers and left fingers. Pertinent negatives include no chest pain or tingling. She has tried acetaminophen for the symptoms. The treatment provided moderate relief.    No problem-specific Assessment & Plan notes found for this encounter.   Past Medical History:  Diagnosis Date  . Allergy   . Anemia    H/O AS A CHILD  . Arthritis   . Back pain   . Breast cancer of upper-outer quadrant of right female breast ([place]) 06/12/2015   4 millimeter, T1a, N0; ER 90%, PR 0%, HER-2/neu not overexpressed.  Declined radiation therapy.  . Collar bone fracture 05/28/2013   Fractured Collar bone on left  . Dysrhythmia    h/o heart skipping beat years ago  . Frequent headaches    h/o migraines  . Hypertension   . Osteoporosis     Past Surgical History:  Procedure Laterality Date  . ABDOMINAL HYSTERECTOMY   1970   Partial  . BREAST BIOPSY Right 05/28/2015   INVASIVE MAMMARY CARCINOMA   . BREAST BIOPSY Right 06/12/2015   Wide excision, sentinel lymph node biopsy.  Marland Kitchen BREAST LUMPECTOMY Right 2016  . BREAST LUMPECTOMY WITH AXILLARY LYMPH NODE DISSECTION Right 06/12/2015        . CATARACT EXTRACTION W/PHACO Left 02/03/2017   Procedure: CATARACT EXTRACTION PHACO AND INTRAOCULAR LENS PLACEMENT (IOC);  Surgeon: Birder Robson, MD;  Location: [facility] ORS;  Service: Ophthalmology;  Laterality: Left;  [place]  00:28 AP% 16.3 CDE 4.68 Fluid pack lot # [id number]  . COLONOSCOPY      Family History  Problem Relation Age of Onset  . Mental illness Mother        Dementia  . Stroke Father   . Cancer Brother        Lung Cancer  . Breast cancer Other     Social History   Socioeconomic History  . Marital status: Married    Spouse name: Not on file  . Number of children: 3  . Years of education: GED  . Highest education level: 12th grade  Social Needs  . Financial resource strain: Not hard at all  . Food insecurity - worry: Never true  . Food insecurity - inability: Never true  . Transportation needs - medical: No  . Transportation needs - non-medical: No  Occupational History  . Occupation: Retired  Tobacco Use  . Smoking status: Never Smoker  . Smokeless tobacco: Never Used  . Tobacco comment: smoking  cessation materials not required  Substance and Sexual Activity  . Alcohol use: No  . Drug use: No  . Sexual activity: Not Currently  Other Topics Concern  . Not on file  Social History Narrative  . Not on file    Allergies  Allergen Reactions  . Other Rash    SUTURES-(PARTIAL HYSTERECTOMY)    Outpatient Medications Prior to Visit  Medication Sig Dispense Refill  . acetaminophen (TYLENOL) 500 MG tablet Take 500 mg by mouth every 6 (six) hours as needed for pain.    . Calcium-Vitamin D (CALTRATE 600 PLUS-VIT D PO) Take 2 tablets by mouth daily.    . cetirizine (ZYRTEC) 10 MG tablet  Take 10 mg by mouth as needed.     Marland Kitchen letrozole (FEMARA) 2.5 MG tablet TAKE ONE TABLET BY MOUTH ONCE DAILY 30 tablet 12  . lisinopril-hydrochlorothiazide (PRINZIDE,ZESTORETIC) 20-12.5 MG tablet Take 1 tablet by mouth daily. 90 tablet 1  . Multiple Vitamins-Minerals (CENTRUM SILVER ULTRA WOMENS PO) Take 1 tablet by mouth daily.     No facility-administered medications prior to visit.     Review of Systems  Constitutional: Negative for chills, fever, malaise/fatigue and weight loss.  HENT: Negative for ear discharge, ear pain and sore throat.   Eyes: Negative for blurred vision.  Respiratory: Negative for cough, sputum production, shortness of breath and wheezing.   Cardiovascular: Negative for chest pain, palpitations, leg swelling and PND.  Gastrointestinal: Negative for abdominal pain, blood in stool, constipation, diarrhea, heartburn, melena and nausea.  Genitourinary: Negative for dysuria, frequency, hematuria and urgency.  Musculoskeletal: Positive for joint pain. Negative for back pain, myalgias and neck pain.  Skin: Negative for rash.  Neurological: Negative for dizziness, tingling, sensory change, focal weakness and headaches.  Endo/Heme/Allergies: Negative for environmental allergies and polydipsia. Does not bruise/bleed easily.  Psychiatric/Behavioral: Negative for depression and suicidal ideas. The patient is not nervous/anxious and does not have insomnia.      Objective  Vitals:   07/30/17 1525  BP: 130/80  Pulse: 72  Weight: 146 lb (66.2 kg)  Height: 5' 4" (1.626 m)    Physical Exam  Constitutional: She is well-developed, well-nourished, and in no distress. No distress.  HENT:  Head: Normocephalic and atraumatic.  Right Ear: External ear normal.  Left Ear: External ear normal.  Nose: Nose normal.  Mouth/Throat: Oropharynx is clear and moist.  Eyes: Conjunctivae and EOM are normal. Pupils are equal, round, and reactive to light. Right eye exhibits no discharge.  Left eye exhibits no discharge.  Neck: Normal range of motion. Neck supple. No JVD present. No thyromegaly present.  Cardiovascular: Normal rate, regular rhythm, normal heart sounds and intact distal pulses. Exam reveals no gallop and no friction rub.  No murmur heard. Pulmonary/Chest: Effort normal and breath sounds normal.  Abdominal: Soft. Bowel sounds are normal. She exhibits no mass. There is no tenderness. There is no guarding.  Musculoskeletal: Normal range of motion. She exhibits no edema.  Lymphadenopathy:    She has no cervical adenopathy.  Neurological: She is alert. She has normal reflexes.  Skin: Skin is warm and dry. She is not diaphoretic.  Psychiatric: Mood and affect normal.  Nursing note and vitals reviewed.     Assessment & Plan  Problem List Items Addressed This Visit      Cardiovascular and Mediastinum   HTN (hypertension) - Primary    Other Visit Diagnoses    Primary osteoarthritis of both hands       suggest  tylenol      No orders of the defined types were placed in this encounter.     Dr. Mac[facility]  07/30/17

## 2017-07-30 NOTE — Progress Notes (Signed)
Subjective:   Kristin Miller is a 82 y.o. female who presents for Medicare Annual (Subsequent) preventive examination.  Review of Systems:  N/A Cardiac Risk Factors include: advanced age (>74mn, >>97women);hypertension     Objective:     Vitals: BP (!) 142/80 (BP Location: Left Arm, Patient Position: Sitting, Cuff Size: Normal)   Pulse 88   Temp 98.5 F (36.9 C) (Oral)   Resp 16   Ht '5\' 4"'  (1.626 m)   Wt 146 lb 6.4 oz (66.4 kg)   BMI 25.13 kg/m   Body mass index is 25.13 kg/m.  Advanced Directives 07/30/2017 06/12/2015 05/21/2015  Does Patient Have a Medical Advance Directive? No No Yes  Type of Advance Directive - -Public librarianLiving will  Copy of HEmpirein Chart? - - No - copy requested  Would patient like information on creating a medical advance directive? Yes (MAU/Ambulatory/Procedural Areas - Information given) No - patient declined information -    Tobacco Social History   Tobacco Use  Smoking Status Never Smoker  Smokeless Tobacco Never Used  Tobacco Comment   smoking cessation materials not required     Counseling given: No Comment: smoking cessation materials not required   Clinical Intake:  Pre-visit preparation completed: Yes  Pain : No/denies pain  BMI - recorded: 25.13 Nutritional Status: BMI 25 -29 Overweight Nutritional Risks: None Diabetes: No  How often do you need to have someone help you when you read instructions, pamphlets, or other written materials from your doctor or pharmacy?: 1 - Never  Interpreter Needed?: No  Information entered by :: AEversole, LPN  Past Medical History:  Diagnosis Date  . Allergy   . Anemia    H/O AS A CHILD  . Arthritis   . Back pain   . [place]   4 millimeter, T1a, N0; ER 90%, PR 0%, HER-2/neu not overexpressed.  Declined radiation therapy.  . Collar bone fracture 05/28/2013   Fractured  Collar bone on left  . Dysrhythmia    h/o heart skipping beat years ago  . Frequent headaches    h/o migraines  . Hypertension   . Osteoporosis    Past Surgical History:  Procedure Laterality Date  . ABDOMINAL HYSTERECTOMY  1970   Partial  . BREAST BIOPSY Right 05/28/2015   INVASIVE MAMMARY CARCINOMA   . BREAST BIOPSY Right 06/12/2015   Wide excision, sentinel lymph node biopsy.  .Marland KitchenBREAST LUMPECTOMY Right 2016  . BREAST LUMPECTOMY WITH AXILLARY LYMPH NODE DISSECTION Right 06/12/2015        . CATARACT EXTRACTION W/PHACO Left 02/03/2017   Procedure: CATARACT EXTRACTION PHACO AND INTRAOCULAR LENS PLACEMENT (IOC);  Surgeon: PBirder Robson MD;  Location: [facility] ORS;  Service: Ophthalmology;  Laterality: Left;  [place] 00:28 AP% 16.3 CDE 4.68 Fluid pack lot # [id number]  . COLONOSCOPY     Family History  Problem Relation Age of Onset  . Mental illness Mother        Dementia  . Stroke Father   . Cancer Brother        Lung Cancer  . Breast cancer Other    Social History   Socioeconomic History  . Marital status: Married    Spouse name: None  . Number of children: 3  . Years of education: GED  . Highest education level: 12th grade  Social Needs  . Financial resource strain: Not hard at all  .  Food insecurity - worry: Never true  . Food insecurity - inability: Never true  . Transportation needs - medical: No  . Transportation needs - non-medical: No  Occupational History  . Occupation: Retired  Tobacco Use  . Smoking status: Never Smoker  . Smokeless tobacco: Never Used  . Tobacco comment: smoking cessation materials not required  Substance and Sexual Activity  . Alcohol use: No  . Drug use: No  . Sexual activity: Not Currently  Other Topics Concern  . None  Social History Narrative  . None    Outpatient Encounter Medications as of 07/30/2017  Medication Sig  . acetaminophen (TYLENOL) 500 MG tablet Take 500 mg by mouth every 6 (six) hours as needed for pain.  .  Calcium-Vitamin D (CALTRATE 600 PLUS-VIT D PO) Take 2 tablets by mouth daily.  . cetirizine (ZYRTEC) 10 MG tablet Take 10 mg by mouth as needed.   Marland Kitchen letrozole (FEMARA) 2.5 MG tablet TAKE ONE TABLET BY MOUTH ONCE DAILY  . lisinopril-hydrochlorothiazide (PRINZIDE,ZESTORETIC) 20-12.5 MG tablet Take 1 tablet by mouth daily.  . Multiple Vitamins-Minerals (CENTRUM SILVER ULTRA WOMENS PO) Take 1 tablet by mouth daily.   No facility-administered encounter medications on file as of 07/30/2017.     Activities of Daily Living In your present state of health, do you have any difficulty performing the following activities: 07/30/2017 02/03/2017  Hearing? N N  Comment denies wearing hearing aids -  Vision? N N  Comment wears eyeglasses -  Difficulty concentrating or making decisions? Y N  Comment short term memory loss -  Walking or climbing stairs? N N  Dressing or bathing? N N  Doing errands, shopping? N -  Preparing Food and eating ? N -  Comment full set upper dentures and partial lower dentures -  Using the Toilet? N -  In the past six months, have you accidently leaked urine? N -  Do you have problems with loss of bowel control? N -  Managing your Medications? N -  Managing your Finances? N -  Housekeeping or managing your Housekeeping? N -  Some recent data might be hidden    Patient Care Team: Juline Patch, MD as PCP - General (Family Medicine) Bary Castilla, Forest Gleason, MD (General Surgery) Birder Robson, MD as Consulting Physician (Ophthalmology)    Assessment:   This is a routine wellness examination for Kristin Miller.  Exercise Activities and Dietary recommendations Current Exercise Habits: Structured exercise class, Type of exercise: strength training/weights;calisthenics;stretching, Time (Minutes): 30, Frequency (Times/Week): 3, Weekly Exercise (Minutes/Week): 90, Intensity: Mild, Exercise limited by: None identified  Goals    . DIET - INCREASE WATER INTAKE     Recommend to drink at  least 6-8 8oz glasses of water per day       Fall Risk Fall Risk  07/30/2017 02/12/2017 11/15/2015 10/09/2014  Falls in the past year? No No No No   Is the patient's home free of loose throw rugs in walkways, pet beds, electrical cords, etc?   yes      Grab bars in the bathroom? No, does have a place to sit in shower      Handrails on the stairs?   yes      Adequate lighting?   yes  Denies use of cane, walker or w/c. Denies use of an elevated toilet seat  Depression Screen PHQ 2/9 Scores 07/30/2017 02/12/2017 11/15/2015 10/09/2014  PHQ - 2 Score 0 0 0 0  PHQ- 9 Score - 1 - -  Cognitive Function     6CIT Screen 07/30/2017  What Year? 0 points  What month? 0 points  What time? 0 points  Count back from 20 0 points  Months in reverse 0 points  Repeat phrase 0 points  Total Score 0    Immunization History  Administered Date(s) Administered  . Influenza Split 03/27/2014  . Influenza, High Dose Seasonal PF 07/30/2017  . Influenza,inj,Quad PF,6+ Mos 04/05/2014  . Pneumococcal Conjugate-13 10/04/2013  . Pneumococcal Polysaccharide-23 02/12/2017  . Tdap 07/21/2008    Qualifies for Shingles Vaccine? Due for Zostavax or Shingrix vaccine. Education has been provided regarding the importance of this vaccine. Pt has been advised to call her insurance company to determine her out of pocket expense. Advised she may also receive this vaccine at her local pharmacy or Health Dept. Verbalized acceptance and understanding.  Screening Tests Health Maintenance  Topic Date Due  . MAMMOGRAM  05/04/2018  . TETANUS/TDAP  07/31/2024  . INFLUENZA VACCINE  Completed  . DEXA SCAN  Completed  . PNA vac Low Risk Adult  Completed    Cancer Screenings: Breast:  Up to date on Mammogram? Yes  Completed 05/04/17. Repeat every year. Up to date of Bone Density/Dexa? Yes  Completed 11/14/15. Pt expressed uncertainty about whether or not she needs to repeat osteoporotic screening. States Dr. Bary Castilla has  managed this screening in the past. Further states she would like to discuss the need for repeat osteoporotic screenings with Dr. Bary Castilla. Colorectal: Completed colonoscopy 07/31/14. Unable to locate report. Pt states she was informed by GI that colon cancer screenings were no longer required  Additional Screenings: Hepatitis B/HIV/Syphillis: does not qualify Hepatitis C Screening: does not qualify     Plan:   I have personally reviewed and addressed the Medicare Annual Wellness questionnaire and have noted the following in the patient's chart:  A. Medical and social history B. Use of alcohol, tobacco or illicit drugs  C. Current medications and supplements D. Functional ability and status E.  Nutritional status F.  Physical activity G. Advance directives H. List of other physicians I.  Hospitalizations, surgeries, and ER visits in previous 12 months J.  [place] such as hearing and vision if needed, cognitive and depression L. Referrals and appointments - none  In addition, I have reviewed and discussed with patient certain preventive protocols, quality metrics, and best practice recommendations. A written personalized care plan for preventive services as well as general preventive health recommendations were provided to patient.  Signed,  Aleatha Borer, LPN Nurse Health Advisor  MD Recommendations: Completed colonoscopy 07/31/14. Unable to locate report and unable to determine frequency for this screening. Pt states she was informed by GI that colon cancer screenings were no longer required.  May be due for DEXA. Completed 11/14/15. Pt expressed uncertainty about whether or not she needs to repeat osteoporotic screening. States Dr. Bary Castilla has managed this screening in the past. Further states she would like to discuss the need for repeat osteoporotic screenings with Dr. Bary Castilla.  Due for repeat mammogram on or after 05/04/18  Due for either Zostavax or Shingrix  vaccine. Education has been provided regarding the importance of this vaccine. Pt has been advised to call her insurance company to determine her out of pocket expense. Advised she may also receive this vaccine at her local pharmacy or Health Dept. Verbalized acceptance and understanding.

## 2017-07-30 NOTE — Patient Instructions (Signed)
[name] , Thank you for taking time to come for your Medicare Wellness Visit. I appreciate your ongoing commitment to your health goals. Please review the following plan we discussed and let me know if I can assist you in the future.   Screening recommendations/referrals: Colonoscopy: Completed colonoscopy 07/31/14. You indicated colon cancer screenings were no longer required Mammogram: Completed 05/04/17. Repeat every year. Bone Density: Declined. Please discuss need for repeat osteoporotic screenings with Dr. Fleet Miller Recommended yearly ophthalmology/optometry visit for glaucoma screening and checkup Recommended yearly dental visit for hygiene and checkup  Vaccinations: Influenza vaccine: Given today Pneumococcal vaccine: Completed series Tdap vaccine: Up to date Shingles vaccine: Declined. Please call your insurance company to determine your out of pocket expense. You may also receive this vaccine at your local pharmacy or Health Dept.  Advanced directives: Advance directive discussed with you today. I have provided a copy for you to complete at home and have notarized. Once this is complete please bring a copy in to our office so we can scan it into your chart.  Conditions/risks identified:Recommend to drink at least 6-8 8oz glasses of water per day  Next appointment: Please schedule your Annual Wellness Visit with your Nurse Health Advisor in one year.  Preventive Care 25 Years and Older, Female Preventive care refers to lifestyle choices and visits with your health care provider that can promote health and wellness. What does preventive care include?  A yearly physical exam. This is also called an annual well check.  Dental exams once or twice a year.  Routine eye exams. Ask your health care provider how often you should have your eyes checked.  Personal lifestyle choices, including:  Daily care of your teeth and gums.  Regular physical activity.  Eating a healthy  diet.  Avoiding tobacco and drug use.  Limiting alcohol use.  Practicing safe sex.  Taking low-dose aspirin every day.  Taking vitamin and mineral supplements as recommended by your health care provider. What happens during an annual well check? The services and screenings done by your health care provider during your annual well check will depend on your age, overall health, lifestyle risk factors, and family history of disease. Counseling  Your health care provider may ask you questions about your:  Alcohol use.  Tobacco use.  Drug use.  Emotional well-being.  Home and relationship well-being.  Sexual activity.  Eating habits.  History of falls.  Memory and ability to understand (cognition).  Work and work Statistician.  Reproductive health. Screening  You may have the following tests or measurements:  Height, weight, and BMI.  Blood pressure.  Lipid and cholesterol levels. These may be checked every 5 years, or more frequently if you are over 58 years old.  Skin check.  Lung cancer screening. You may have this screening every year starting at age 13 if you have a 30-pack-year history of smoking and currently smoke or have quit within the past 15 years.  Fecal occult blood test (FOBT) of the stool. You may have this test every year starting at age 71.  Flexible sigmoidoscopy or colonoscopy. You may have a sigmoidoscopy every 5 years or a colonoscopy every 10 years starting at age 23.  Hepatitis C blood test.  Hepatitis B blood test.  Sexually transmitted disease (STD) testing.  Diabetes screening. This is done by checking your blood sugar (glucose) after you have not eaten for a while (fasting). You may have this done every 1-3 years.  Bone density scan. This  is done to screen for osteoporosis. You may have this done starting at age 74.  Mammogram. This may be done every 1-2 years. Talk to your health care provider about how often you should have  regular mammograms. Talk with your health care provider about your test results, treatment options, and if necessary, the need for more tests. Vaccines  Your health care provider may recommend certain vaccines, such as:  Influenza vaccine. This is recommended every year.  Tetanus, diphtheria, and acellular pertussis (Tdap, Td) vaccine. You may need a Td booster every 10 years.  Zoster vaccine. You may need this after age 66.  Pneumococcal 13-valent conjugate (PCV13) vaccine. One dose is recommended after age 1.  Pneumococcal polysaccharide (PPSV23) vaccine. One dose is recommended after age 83. Talk to your health care provider about which screenings and vaccines you need and how often you need them. This information is not intended to replace advice given to you by your health care provider. Make sure you discuss any questions you have with your health care provider. Document Released: 08/03/2015 Document Revised: 03/26/2016 Document Reviewed: 05/08/2015 Elsevier Interactive Patient Education  2017 [employer] Prevention in the Home Falls can cause injuries. They can happen to people of all ages. There are many things you can do to make your home safe and to help prevent falls. What can I do on the outside of my home?  Regularly fix the edges of walkways and driveways and fix any cracks.  Remove anything that might make you trip as you walk through a door, such as a raised step or threshold.  Trim any bushes or trees on the path to your home.  Use bright outdoor lighting.  Clear any walking paths of anything that might make someone trip, such as rocks or tools.  Regularly check to see if handrails are loose or broken. Make sure that both sides of any steps have handrails.  Any raised decks and porches should have guardrails on the edges.  Have any leaves, snow, or ice cleared regularly.  Use sand or salt on walking paths during winter.  Clean up any spills in your  garage right away. This includes oil or grease spills. What can I do in the bathroom?  Use night lights.  Install grab bars by the toilet and in the tub and shower. Do not use towel bars as grab bars.  Use non-skid mats or decals in the tub or shower.  If you need to sit down in the shower, use a plastic, non-slip stool.  Keep the floor dry. Clean up any water that spills on the floor as soon as it happens.  Remove soap buildup in the tub or shower regularly.  Attach bath mats securely with double-sided non-slip rug tape.  Do not have throw rugs and other things on the floor that can make you trip. What can I do in the bedroom?  Use night lights.  Make sure that you have a light by your bed that is easy to reach.  Do not use any sheets or blankets that are too big for your bed. They should not hang down onto the floor.  Have a firm chair that has side arms. You can use this for support while you get dressed.  Do not have throw rugs and other things on the floor that can make you trip. What can I do in the kitchen?  Clean up any spills right away.  Avoid walking on wet floors.  Keep items that you use a lot in easy-to-reach places.  If you need to reach something above you, use a strong step stool that has a grab bar.  Keep electrical cords out of the way.  Do not use floor polish or wax that makes floors slippery. If you must use wax, use non-skid floor wax.  Do not have throw rugs and other things on the floor that can make you trip. What can I do with my stairs?  Do not leave any items on the stairs.  Make sure that there are handrails on both sides of the stairs and use them. Fix handrails that are broken or loose. Make sure that handrails are as long as the stairways.  Check any carpeting to make sure that it is firmly attached to the stairs. Fix any carpet that is loose or worn.  Avoid having throw rugs at the top or bottom of the stairs. If you do have throw  rugs, attach them to the floor with carpet tape.  Make sure that you have a light switch at the top of the stairs and the bottom of the stairs. If you do not have them, ask someone to add them for you. What else can I do to help prevent falls?  Wear shoes that:  Do not have high heels.  Have rubber bottoms.  Are comfortable and fit you well.  Are closed at the toe. Do not wear sandals.  If you use a stepladder:  Make sure that it is fully opened. Do not climb a closed stepladder.  Make sure that both sides of the stepladder are locked into place.  Ask someone to hold it for you, if possible.  Clearly mark and make sure that you can see:  Any grab bars or handrails.  First and last steps.  Where the edge of each step is.  Use tools that help you move around (mobility aids) if they are needed. These include:  Canes.  Walkers.  Scooters.  Crutches.  Turn on the lights when you go into a dark area. Replace any light bulbs as soon as they burn out.  Set up your furniture so you have a clear path. Avoid moving your furniture around.  If any of your floors are uneven, fix them.  If there are any pets around you, be aware of where they are.  Review your medicines with your doctor. Some medicines can make you feel dizzy. This can increase your chance of falling. Ask your doctor what other things that you can do to help prevent falls. This information is not intended to replace advice given to you by your health care provider. Make sure you discuss any questions you have with your health care provider. Document Released: 05/03/2009 Document Revised: 12/13/2015 Document Reviewed: 08/11/2014 Elsevier Interactive Patient Education  2017 [employer].

## 2017-07-30 NOTE — Patient Instructions (Signed)
Letrozole tablets What is this medicine? LETROZOLE (LET roe zole) blocks the production of estrogen. It is used to treat breast cancer. This medicine may be used for other purposes; ask your health care provider or pharmacist if you have questions. COMMON BRAND NAME(S): Femara What should I tell my health care provider before I take this medicine? They need to know if you have any of these conditions: -high cholesterol -liver disease -osteoporosis (weak bones) -an unusual or allergic reaction to letrozole, other medicines, foods, dyes, or preservatives -pregnant or trying to get pregnant -breast-feeding How should I use this medicine? Take this medicine by mouth with a glass of water. You may take it with or without food. Follow the directions on the prescription label. Take your medicine at regular intervals. Do not take your medicine more often than directed. Do not stop taking except on your doctor's advice. Talk to your pediatrician regarding the use of this medicine in children. Special care may be needed. Overdosage: If you think you have taken too much of this medicine contact a poison control center or emergency room at once. NOTE: This medicine is only for you. Do not share this medicine with others. What if I miss a dose? If you miss a dose, take it as soon as you can. If it is almost time for your next dose, take only that dose. Do not take double or extra doses. What may interact with this medicine? Do not take this medicine with any of the following medications: -estrogens, like hormone replacement therapy or birth control pills This medicine may also interact with the following medications: -dietary supplements such as androstenedione or DHEA -prasterone -tamoxifen This list may not describe all possible interactions. Give your health care provider a list of all the medicines, herbs, non-prescription drugs, or dietary supplements you use. Also tell them if you smoke, drink  alcohol, or use illegal drugs. Some items may interact with your medicine. What should I watch for while using this medicine? Tell your doctor or healthcare professional if your symptoms do not start to get better or if they get worse. Do not become pregnant while taking this medicine or for 3 weeks after stopping it. Women should inform their doctor if they wish to become pregnant or think they might be pregnant. There is a potential for serious side effects to an unborn child. Talk to your health care professional or pharmacist for more information. Do not breast-feed while taking this medicine or for 3 weeks after stopping it. This medicine may interfere with the ability to have a child. Talk with your doctor or health care professional if you are concerned about your fertility. Using this medicine for a long time may increase your risk of low bone mass. Talk to your doctor about bone health. You may get drowsy or dizzy. Do not drive, use machinery, or do anything that needs mental alertness until you know how this medicine affects you. Do not stand or sit up quickly, especially if you are an older patient. This reduces the risk of dizzy or fainting spells. You may need blood work done while you are taking this medicine. What side effects may I notice from receiving this medicine? Side effects that you should report to your doctor or health care professional as soon as possible: -allergic reactions like skin rash, itching, or hives -bone fracture -chest pain -signs and symptoms of a blood clot such as breathing problems; changes in vision; chest pain; severe, sudden headache; pain, swelling,   warmth in the leg; trouble speaking; sudden numbness or weakness of the face, arm or leg -vaginal bleeding Side effects that usually do not require medical attention (report to your doctor or health care professional if they continue or are bothersome): -bone, back, joint, or muscle  pain -dizziness -fatigue -fluid retention -headache -hot flashes, night sweats -nausea -weight gain This list may not describe all possible side effects. Call your doctor for medical advice about side effects. You may report side effects to FDA at 1-800-FDA-1088. Where should I keep my medicine? Keep out of the reach of children. Store between 15 and 30 degrees C (59 and 86 degrees F). Throw away any unused medicine after the expiration date. NOTE: This sheet is a summary. It may not cover all possible information. If you have questions about this medicine, talk to your doctor, pharmacist, or health care provider.  2018 Elsevier/Gold Standard (2016-02-11 11:10:41)  

## 2017-08-03 ENCOUNTER — Ambulatory Visit: Payer: Medicare HMO | Admitting: Family Medicine

## 2017-08-03 ENCOUNTER — Ambulatory Visit: Payer: Medicare HMO

## 2017-08-13 ENCOUNTER — Other Ambulatory Visit: Payer: Self-pay | Admitting: General Surgery

## 2017-08-13 DIAGNOSIS — C50511 Malignant neoplasm of lower-outer quadrant of right female breast: Secondary | ICD-10-CM

## 2017-08-24 DIAGNOSIS — H40003 Preglaucoma, unspecified, bilateral: Secondary | ICD-10-CM | POA: Diagnosis not present

## 2017-08-26 ENCOUNTER — Other Ambulatory Visit: Payer: Self-pay | Admitting: Family Medicine

## 2017-08-26 DIAGNOSIS — I10 Essential (primary) hypertension: Secondary | ICD-10-CM

## 2017-11-04 IMAGING — MG MM DIGITAL DIAGNOSTIC BILAT W/ TOMO W/ CAD
8 of 14 series · 8 of 30 positions shown · non-contrast
Comparison: Previous exam(s).

CLINICAL DATA: 81-year-old female presenting for annual bilateral
mammogram. The patient is status post right lumpectomy and sentinel
lymph node dissection for cancer in 5410.

EXAM:
2D DIGITAL DIAGNOSTIC BILATERAL MAMMOGRAM WITH CAD AND ADJUNCT TOMO

[R CC (1 of 3)]
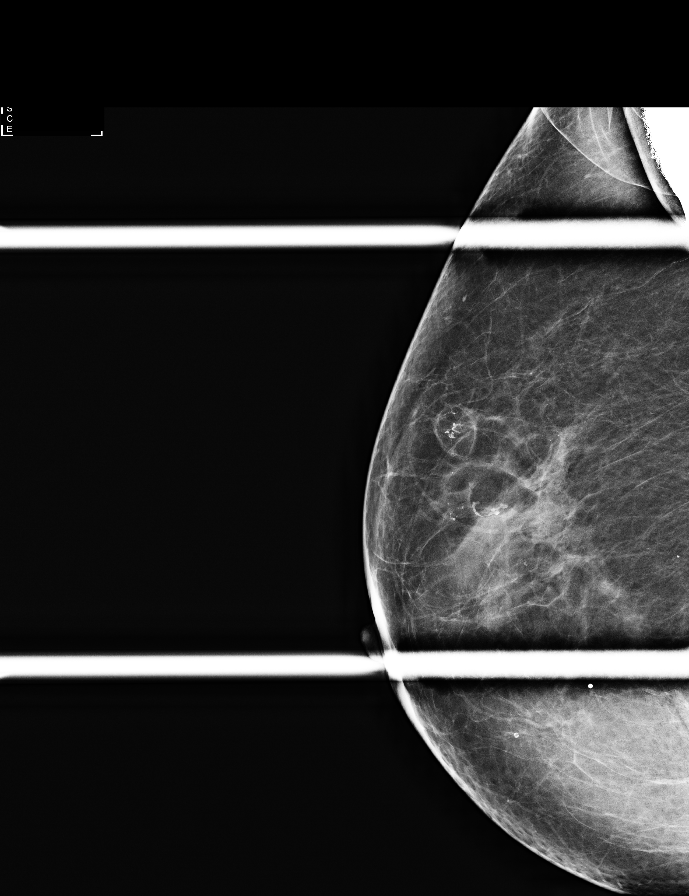

[R CC (2 of 3)]
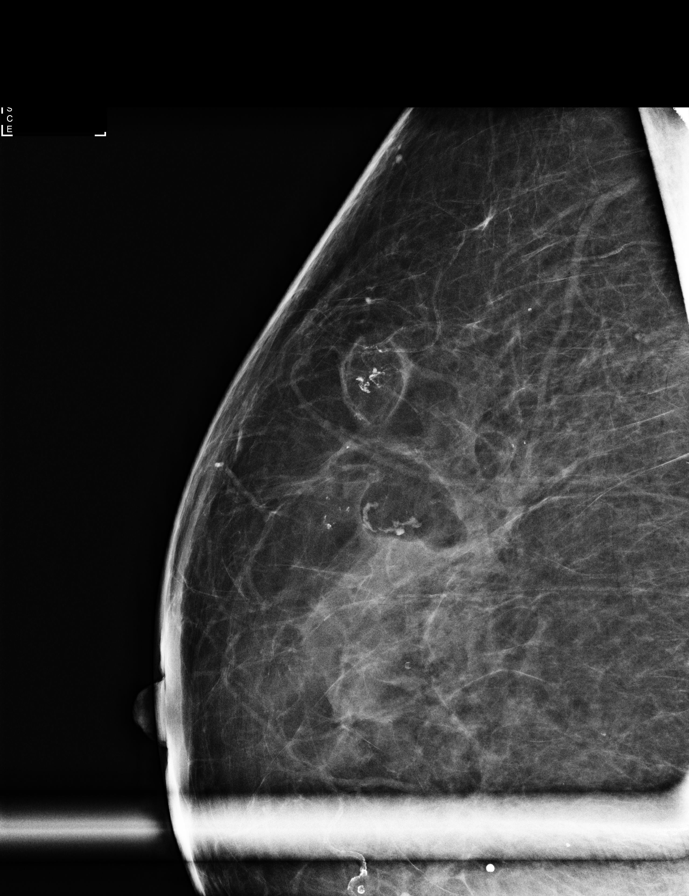

[L CC]
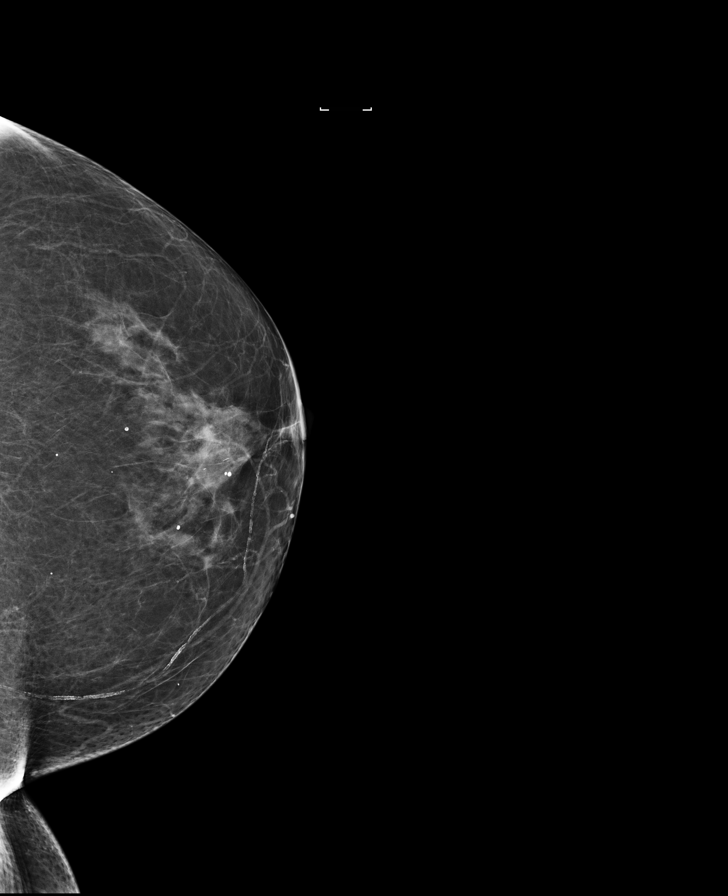

[R CC synth-2D]
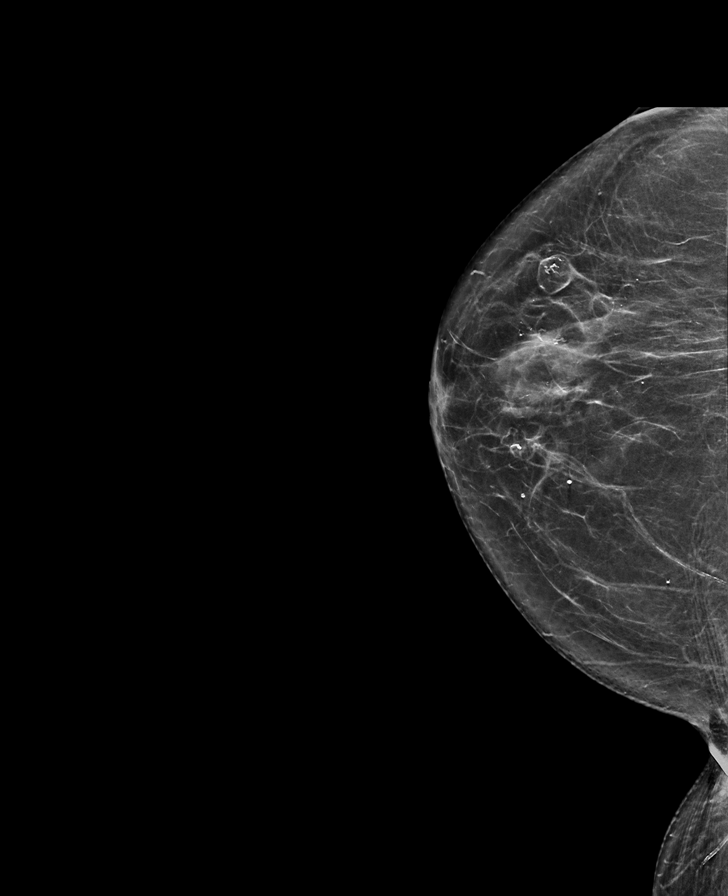

[R MLO synth-2D]
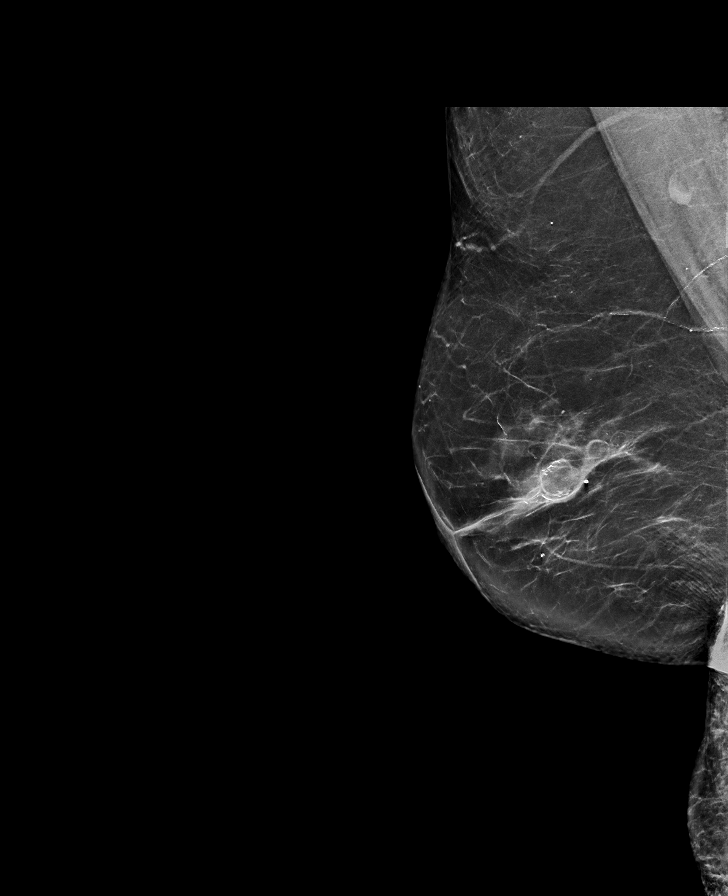

[R CC (3 of 3)]
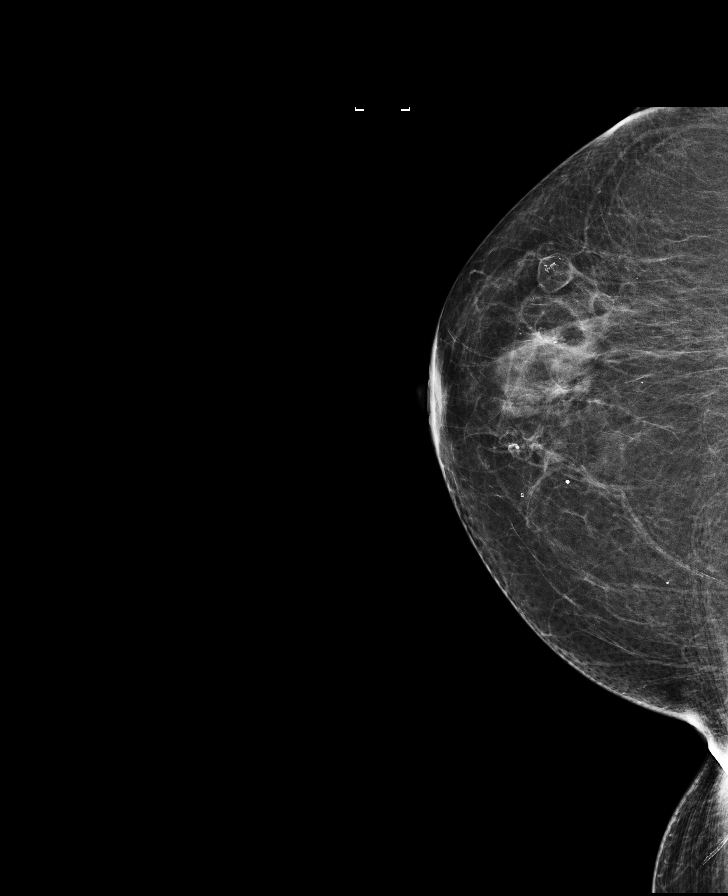

[L MLO]
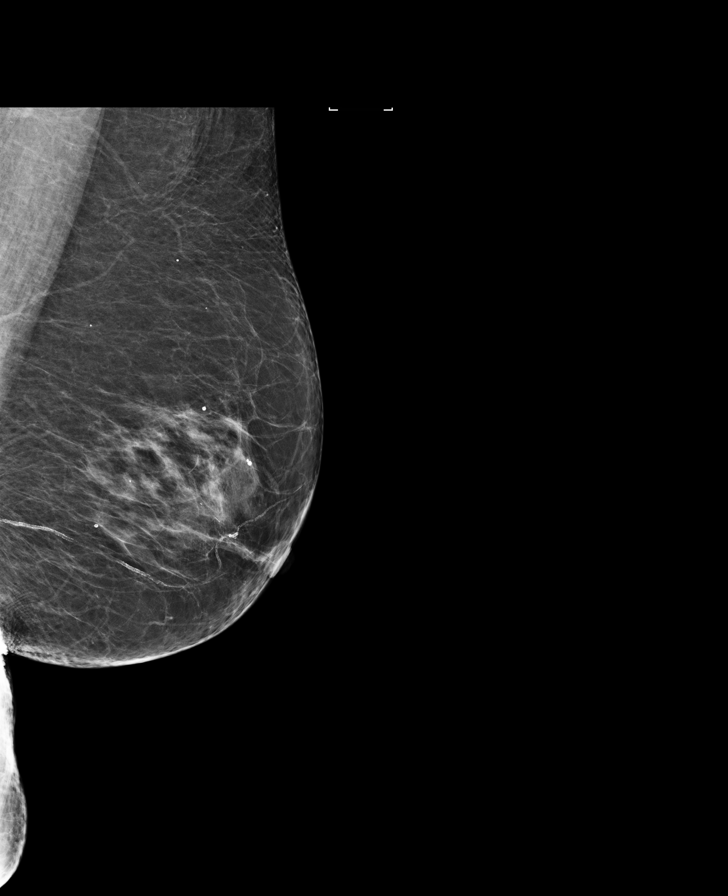

[R MLO]
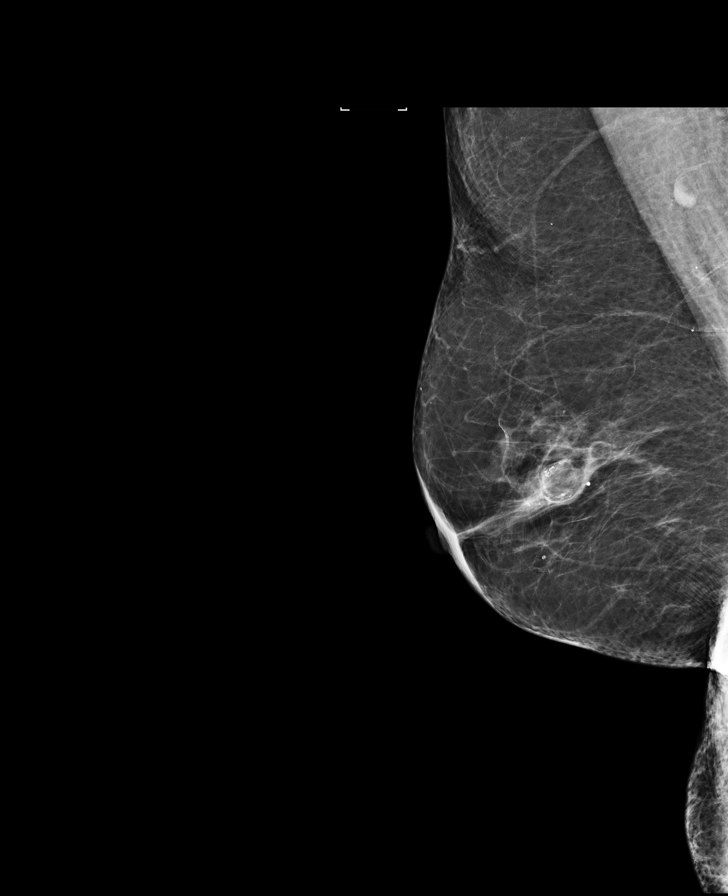

[8 of 30 positions shown; findings below may reference images not displayed]

ACR Breast Density Category c: The breast tissue is heterogeneously
dense, which may obscure small masses.
FINDINGS: Evolving postoperative changes are noted in the central lateral
right breast. No suspicious masses or calcifications are identified
in either breast.

Mammographic images were processed with CAD.
IMPRESSION: 1. No mammographic evidence of malignancy in either breast.
2. Right breast postsurgical changes.

RECOMMENDATION:
Diagnostic mammogram is suggested in 1 year. (Code:[id number])

I have discussed the findings and recommendations with the patient.
Results were also provided in writing at the conclusion of the
visit. If applicable, a reminder letter will be sent to the patient
regarding the next appointment.

BI-RADS CATEGORY  2: Benign.

## 2017-11-17 ENCOUNTER — Ambulatory Visit (INDEPENDENT_AMBULATORY_CARE_PROVIDER_SITE_OTHER): Payer: Medicare HMO | Admitting: Family Medicine

## 2017-11-17 ENCOUNTER — Encounter: Payer: Self-pay | Admitting: Family Medicine

## 2017-11-17 VITALS — BP 130/80 | HR 72 | Ht 64.0 in | Wt 149.0 lb

## 2017-11-17 DIAGNOSIS — J301 Allergic rhinitis due to pollen: Secondary | ICD-10-CM

## 2017-11-17 MED ORDER — MOMETASONE FUROATE 50 MCG/ACT NA SUSP: 2.0000 | Freq: Every day | NASAL | 12 refills | Status: DC

## 2017-11-17 MED ORDER — MONTELUKAST SODIUM 10 MG PO TABS: 10.0000 mg | ORAL_TABLET | Freq: Every day | ORAL | 3 refills | Status: DC

## 2017-11-17 NOTE — Progress Notes (Signed)
Name: Kristin Miller   MRN: [id number]    DOB: Jan 30, 1936   Date:11/17/2017       Progress Note  Subjective  Chief Complaint  Chief Complaint  Patient presents with  . Cough    cong- little production/ no color    Cough  This is a new problem. The current episode started in the past 7 days (last Friday). The problem has been gradually improving. Associated symptoms include chills and postnasal drip. Pertinent negatives include no chest pain, ear congestion, ear pain, fever, headaches, heartburn, hemoptysis, myalgias, nasal congestion, rash, rhinorrhea, sore throat, shortness of breath, sweats, weight loss or wheezing. The symptoms are aggravated by pollens. Treatments tried: OTC nasal antihistamin e/deconestant. The treatment provided no relief. There is no history of environmental allergies.    No problem-specific Assessment & Plan notes found for this encounter.   Past Medical History:  Diagnosis Date  . Allergy   . Anemia    H/O AS A CHILD  . Arthritis   . Back pain   . Breast cancer of upper-outer quadrant of right female breast ([facility]) 06/12/2015   4 millimeter, T1a, N0; ER 90%, PR 0%, HER-2/neu not overexpressed.  Declined radiation therapy.  . Collar bone fracture 05/28/2013   Fractured Collar bone on left  . Dysrhythmia    h/o heart skipping beat years ago  . Frequent headaches    h/o migraines  . Hypertension   . Osteoporosis     Past Surgical History:  Procedure Laterality Date  . ABDOMINAL HYSTERECTOMY  1970   Partial  . BREAST BIOPSY Right 05/28/2015   INVASIVE MAMMARY CARCINOMA   . BREAST BIOPSY Right 06/12/2015   Wide excision, sentinel lymph node biopsy.  Marland Kitchen BREAST LUMPECTOMY Right 2016  . BREAST LUMPECTOMY WITH AXILLARY LYMPH NODE DISSECTION Right 06/12/2015        . CATARACT EXTRACTION W/PHACO Left 02/03/2017   Procedure: CATARACT EXTRACTION PHACO AND INTRAOCULAR LENS PLACEMENT (I[facility]);  Surgeon: Birder Robson, MD;  Location: [facility] ORS;  Service:  Ophthalmology;  Laterality: Left;  [place]  00:28 AP% 16.3 CDE 4.68 Fluid pack lot # [id number]  . COLONOSCOPY      Family History  Problem Relation Age of Onset  . Mental illness Mother        Dementia  . Stroke Father   . Cancer Brother        Lung Cancer  . Breast cancer Other     Social History   Socioeconomic History  . Marital status: Married    Spouse name: Not on file  . Number of children: 3  . Years of education: GED  . Highest education level: 12th grade  Occupational History  . Occupation: Retired  Scientific laboratory technician  . Financial resource strain: Not hard at all  . Food insecurity:    Worry: Never true    Inability: Never true  . Transportation needs:    Medical: No    Non-medical: No  Tobacco Use  . Smoking status: Never Smoker  . Smokeless tobacco: Never Used  . Tobacco comment: smoking cessation materials not required  Substance and Sexual Activity  . Alcohol use: No  . Drug use: No  . Sexual activity: Not Currently  Lifestyle  . Physical activity:    Days per week: 3 days    Minutes per session: 30 min  . Stress: Not at all  Relationships  . Social connections:    Talks on phone: Patient refused    Gets together:  Patient refused    Attends religious service: Patient refused    Active member of club or organization: Patient refused    Attends meetings of clubs or organizations: Patient refused    Relationship status: Married  . Intimate partner violence:    Fear of current or ex partner: No    Emotionally abused: No    Physically abused: No    Forced sexual activity: No  Other Topics Concern  . Not on file  Social History Narrative  . Not on file    Allergies  Allergen Reactions  . Other Rash    SUTURES-(PARTIAL HYSTERECTOMY)    Outpatient Medications Prior to Visit  Medication Sig Dispense Refill  . acetaminophen (TYLENOL) 500 MG tablet Take 500 mg by mouth every 6 (six) hours as needed for pain.    . Calcium-Vitamin D (CALTRATE 600  PLUS-VIT D PO) Take 2 tablets by mouth daily.    Marland Kitchen letrozole (FEMARA) 2.5 MG tablet TAKE ONE TABLET BY MOUTH ONCE DAILY 30 tablet 12  . lisinopril-hydrochlorothiazide (PRINZIDE,ZESTORETIC) 20-12.5 MG tablet TAKE 1 TABLET BY MOUTH ONCE DAILY 90 tablet 1  . Multiple Vitamins-Minerals (CENTRUM SILVER ULTRA WOMENS PO) Take 1 tablet by mouth daily.    . cetirizine (ZYRTEC) 10 MG tablet Take 10 mg by mouth as needed.      No facility-administered medications prior to visit.     Review of Systems  Constitutional: Positive for chills. Negative for fever, malaise/fatigue and weight loss.  HENT: Positive for postnasal drip. Negative for ear discharge, ear pain, rhinorrhea and sore throat.   Eyes: Negative for blurred vision.  Respiratory: Positive for cough. Negative for hemoptysis, sputum production, shortness of breath and wheezing.   Cardiovascular: Negative for chest pain, palpitations and leg swelling.  Gastrointestinal: Negative for abdominal pain, blood in stool, constipation, diarrhea, heartburn, melena and nausea.  Genitourinary: Negative for dysuria, frequency, hematuria and urgency.  Musculoskeletal: Negative for back pain, joint pain, myalgias and neck pain.  Skin: Negative for rash.  Neurological: Negative for dizziness, tingling, sensory change, focal weakness and headaches.  Endo/Heme/Allergies: Negative for environmental allergies and polydipsia. Does not bruise/bleed easily.  Psychiatric/Behavioral: Negative for depression and suicidal ideas. The patient is not nervous/anxious and does not have insomnia.      Objective  Vitals:   11/17/17 1347  BP: 130/80  Pulse: 72  Weight: 149 lb (67.6 kg)  Height: '5\' 4"'$  (1.626 m)    Physical Exam  Constitutional: She is oriented to person, place, and time. She appears well-developed and well-nourished.  HENT:  Head: Normocephalic.  Right Ear: Tympanic membrane, external ear and ear canal normal.  Left Ear: Tympanic membrane, external  ear and ear canal normal.  Nose: Mucosal edema present. No rhinorrhea. Right sinus exhibits no maxillary sinus tenderness and no frontal sinus tenderness. Left sinus exhibits no maxillary sinus tenderness and no frontal sinus tenderness.  Mouth/Throat: Oropharynx is clear and moist.  Eyes: Pupils are equal, round, and reactive to light. Conjunctivae and EOM are normal. Lids are everted and swept, no foreign bodies found. Left eye exhibits no hordeolum. No foreign body present in the left eye. Right conjunctiva is not injected. Left conjunctiva is not injected. No scleral icterus.  Neck: Normal range of motion. Neck supple. No JVD present. No tracheal deviation present. No thyromegaly present.  Cardiovascular: Normal rate, regular rhythm, normal heart sounds and intact distal pulses. Exam reveals no gallop and no friction rub.  No murmur heard. Pulmonary/Chest: Effort normal and breath  sounds normal. No respiratory distress. She has no wheezes. She has no rales.  Abdominal: Soft. Bowel sounds are normal. She exhibits no mass. There is no hepatosplenomegaly. There is no tenderness. There is no rebound and no guarding.  Musculoskeletal: Normal range of motion. She exhibits no edema or tenderness.  Lymphadenopathy:       Head (right side): No submandibular and no tonsillar adenopathy present.       Head (left side): No submandibular and no tonsillar adenopathy present.    She has no cervical adenopathy.  Neurological: She is alert and oriented to person, place, and time. She has normal strength. She displays normal reflexes. No cranial nerve deficit.  Skin: Skin is warm. No rash noted.  Psychiatric: She has a normal mood and affect. Her mood appears not anxious. She does not exhibit a depressed mood.  Nursing note and vitals reviewed.     Assessment & Plan  Problem List Items Addressed This Visit    None    Visit Diagnoses    Seasonal allergic rhinitis due to pollen    -  Primary   Relevant  Medications   mometasone (NASONEX) 50 MCG/ACT nasal spray   montelukast (SINGULAIR) 10 MG tablet      Meds ordered this encounter  Medications  . mometasone (NASONEX) 50 MCG/ACT nasal spray    Sig: Place 2 sprays into the nose daily.    Dispense:  17 g    Refill:  12  . montelukast (SINGULAIR) 10 MG tablet    Sig: Take 1 tablet (10 mg total) by mouth at bedtime.    Dispense:  30 tablet    Refill:  3      Dr. Mac[facility]  11/17/17

## 2017-11-23 ENCOUNTER — Emergency Department: Payer: Medicare HMO

## 2017-11-23 ENCOUNTER — Encounter: Payer: Self-pay | Admitting: Emergency Medicine

## 2017-11-23 ENCOUNTER — Emergency Department
Admission: EM | Admit: 2017-11-23 | Discharge: 2017-11-23 | Disposition: A | Discharge status: Home / Self Care | Payer: Medicare HMO | Attending: Emergency Medicine | Admitting: Emergency Medicine

## 2017-11-23 ENCOUNTER — Other Ambulatory Visit: Payer: Self-pay

## 2017-11-23 DIAGNOSIS — S62111A Displaced fracture of triquetrum [cuneiform] bone, right wrist, initial encounter for closed fracture: Secondary | ICD-10-CM | POA: Diagnosis not present

## 2017-11-23 DIAGNOSIS — Z853 Personal history of malignant neoplasm of breast: Secondary | ICD-10-CM | POA: Insufficient documentation

## 2017-11-23 DIAGNOSIS — S52501A Unspecified fracture of the lower end of right radius, initial encounter for closed fracture: Secondary | ICD-10-CM | POA: Diagnosis not present

## 2017-11-23 DIAGNOSIS — I10 Essential (primary) hypertension: Secondary | ICD-10-CM | POA: Insufficient documentation

## 2017-11-23 DIAGNOSIS — S52611A Displaced fracture of right ulna styloid process, initial encounter for closed fracture: Secondary | ICD-10-CM | POA: Diagnosis not present

## 2017-11-23 DIAGNOSIS — Y999 Unspecified external cause status: Secondary | ICD-10-CM | POA: Insufficient documentation

## 2017-11-23 DIAGNOSIS — S52531A Colles' fracture of right radius, initial encounter for closed fracture: Secondary | ICD-10-CM

## 2017-11-23 DIAGNOSIS — Z85828 Personal history of other malignant neoplasm of skin: Secondary | ICD-10-CM | POA: Diagnosis not present

## 2017-11-23 DIAGNOSIS — Y92000 Kitchen of unspecified non-institutional (private) residence as  the place of occurrence of the external cause: Secondary | ICD-10-CM | POA: Insufficient documentation

## 2017-11-23 DIAGNOSIS — S0990XA Unspecified injury of head, initial encounter: Secondary | ICD-10-CM | POA: Diagnosis not present

## 2017-11-23 DIAGNOSIS — S199XXA Unspecified injury of neck, initial encounter: Secondary | ICD-10-CM | POA: Diagnosis not present

## 2017-11-23 DIAGNOSIS — Z79899 Other long term (current) drug therapy: Secondary | ICD-10-CM | POA: Diagnosis not present

## 2017-11-23 DIAGNOSIS — Y9389 Activity, other specified: Secondary | ICD-10-CM | POA: Insufficient documentation

## 2017-11-23 DIAGNOSIS — W1789XA Other fall from one level to another, initial encounter: Secondary | ICD-10-CM | POA: Diagnosis not present

## 2017-11-23 DIAGNOSIS — S0181XA Laceration without foreign body of other part of head, initial encounter: Secondary | ICD-10-CM | POA: Diagnosis not present

## 2017-11-23 DIAGNOSIS — S098XXA Other specified injuries of head, initial encounter: Secondary | ICD-10-CM | POA: Diagnosis not present

## 2017-11-23 DIAGNOSIS — S52311A Greenstick fracture of shaft of radius, right arm, initial encounter for closed fracture: Secondary | ICD-10-CM | POA: Diagnosis not present

## 2017-11-23 MED ORDER — HYDROCODONE-ACETAMINOPHEN 5-325 MG PO TABS: 1.0000 | ORAL_TABLET | Freq: Four times a day (QID) | ORAL | 0 refills | Status: DC | PRN

## 2017-11-23 MED ORDER — PROPOFOL 10 MG/ML IV BOLUS
INTRAVENOUS | Status: AC | PRN
  Administered 2017-11-23: 70 mg via INTRAVENOUS

## 2017-11-23 MED ORDER — SODIUM CHLORIDE 0.9 % IV SOLN
INTRAVENOUS | Status: AC | PRN
  Administered 2017-11-23: 1000 mL via INTRAVENOUS

## 2017-11-23 MED ORDER — BUPIVACAINE HCL (PF) 0.5 % IJ SOLN
INTRAMUSCULAR | Status: AC
  Administered 2017-11-23: 30 mL
  Filled 2017-11-23: qty 30

## 2017-11-23 MED ORDER — BUPIVACAINE HCL 0.5 % IJ SOLN
30.0000 mL | Freq: Once | INTRAMUSCULAR | Status: AC
  Administered 2017-11-23: 30 mL
  Filled 2017-11-23 (×2): qty 30

## 2017-11-23 MED ORDER — PROPOFOL 10 MG/ML IV BOLUS
70.0000 mg | Freq: Once | INTRAVENOUS | Status: DC
  Filled 2017-11-23: qty 20

## 2017-11-23 MED ORDER — SODIUM CHLORIDE 0.9 % IV BOLUS: 1000.0000 mL | Freq: Once | INTRAVENOUS | Status: DC

## 2017-11-23 NOTE — Sedation Documentation (Signed)
Pt taking sips of water, tolerating PO at this time.

## 2017-11-23 NOTE — Sedation Documentation (Signed)
Medication dose calculated and verified for: Propofol 70mg  IV. Verified with Caryl Pina, RN

## 2017-11-23 NOTE — ED Provider Notes (Signed)
[facility] Emergency Department Provider Note  ____________________________________________   First MD Initiated Contact with Patient 11/23/17 1018     (approximate)  I have reviewed the triage vital signs and the nursing notes.   HISTORY  Chief Complaint Fall; Head Laceration; Head Injury; and Arm Injury   HPI Kristin Miller is a 82 y.o. female self presents to the emergency department after mechanical fall.  She was trying to get up on a chair to reposition something at home when she slipped and fell on an outstretched hand onto her right hand and struck the right side of her face.  She suffered immediate severe pain in her distal right wrist.  The pain radiates up towards the elbow.  Some numbness no weakness.  The pain is worse with movement improved with rest.  Last tetanus is up-to-date.  No loss of consciousness.  No current chest pain shortness of breath abdominal pain nausea vomiting.  Past Medical History:  Diagnosis Date  . Allergy   . Anemia    H/O AS A CHILD  . Arthritis   . Back pain   . Breast cancer of upper-outer quadrant of right female breast ([place]) 06/12/2015   4 millimeter, T1a, N0; ER 90%, PR 0%, HER-2/neu not overexpressed.  Declined radiation therapy.  . Collar bone fracture 05/28/2013   Fractured Collar bone on left  . Dysrhythmia    h/o heart skipping beat years ago  . Frequent headaches    h/o migraines  . Hypertension   . Osteoporosis     Patient Active Problem List   Diagnosis Date Noted  . BP (high blood pressure) 11/15/2015  . OP (osteoporosis) 11/15/2015  . Primary cancer of lower-outer quadrant of right breast ([place]) 06/20/2015  . Medicare annual wellness visit, subsequent 10/09/2014  . Low back pain 08/02/2014  . Establishing care with new doctor, encounter for 08/02/2014  . HTN (hypertension) 08/02/2014  . Basal cell carcinoma in situ of skin of left shoulder 07/31/2014  . Microcalcifications of the breast  11/28/2013  . Abnormal finding on mammography 10/03/2013  . CA of skin 10/03/2013  . Mammographic microcalcification 05/04/2013  . Encounter for gynecological examination without abnormal finding 04/06/2013  . Chronic LBP 03/29/2012    Past Surgical History:  Procedure Laterality Date  . ABDOMINAL HYSTERECTOMY  1970   Partial  . BREAST BIOPSY Right 05/28/2015   INVASIVE MAMMARY CARCINOMA   . BREAST BIOPSY Right 06/12/2015   Wide excision, sentinel lymph node biopsy.  Marland Kitchen BREAST LUMPECTOMY Right 2016  . BREAST LUMPECTOMY WITH AXILLARY LYMPH NODE DISSECTION Right 06/12/2015        . CATARACT EXTRACTION W/PHACO Left 02/03/2017   Procedure: CATARACT EXTRACTION PHACO AND INTRAOCULAR LENS PLACEMENT (IOC);  Surgeon: Birder Robson, MD;  Location: [facility] ORS;  Service: Ophthalmology;  Laterality: Left;  [place]  00:28 AP% 16.3 CDE 4.68 Fluid pack lot # [id number]  . COLONOSCOPY      Prior to Admission medications   Medication Sig Start Date End Date Taking? Authorizing Provider  acetaminophen (TYLENOL) 500 MG tablet Take 500 mg by mouth every 6 (six) hours as needed for pain.    [provider]  Calcium-Vitamin D (CALTRATE 600 PLUS-VIT D PO) Take 2 tablets by mouth daily.    [provider]  cetirizine (ZYRTEC) 10 MG tablet Take 10 mg by mouth as needed.     [provider]  HYDROcodone-acetaminophen (NORCO) 5-325 MG tablet Take 1 tablet by mouth every 6 (  six) hours as needed for up to 11 doses for severe pain. 11/23/17   Darel Hong, MD  letrozole [facility]) 2.5 MG tablet TAKE ONE TABLET BY MOUTH ONCE DAILY 08/13/17   Robert Bellow, MD  lisinopril-hydrochlorothiazide (PRINZIDE,ZESTORETIC) 20-12.5 MG tablet TAKE 1 TABLET BY MOUTH ONCE DAILY 08/27/17   Juline Patch, MD  mometasone (NASONEX) 50 MCG/ACT nasal spray Place 2 sprays into the nose daily. 11/17/17   Juline Patch, MD  montelukast (SINGULAIR) 10 MG tablet Take 1 tablet (10 mg total) by mouth at bedtime.  11/17/17   Juline Patch, MD  Multiple Vitamins-Minerals (CENTRUM SILVER ULTRA WOMENS PO) Take 1 tablet by mouth daily.    [provider]    Allergies Other  Family History  Problem Relation Age of Onset  . Mental illness Mother        Dementia  . Stroke Father   . Cancer Brother        Lung Cancer  . Breast cancer Other     Social History Social History   Tobacco Use  . Smoking status: Never Smoker  . Smokeless tobacco: Never Used  . Tobacco comment: smoking cessation materials not required  Substance Use Topics  . Alcohol use: No  . Drug use: No    Review of Systems Constitutional: No fever/chills Eyes: No visual changes. ENT: No sore throat. Cardiovascular: Denies chest pain. Respiratory: Denies shortness of breath. Gastrointestinal: No abdominal pain.  No nausea, no vomiting.  No diarrhea.  No constipation. Genitourinary: Negative for dysuria. Musculoskeletal: Positive for wrist pain. Skin: Positive for wound Neurological: Positive for headache   ____________________________________________   PHYSICAL EXAM:  VITAL SIGNS: ED Triage Vitals [11/23/17 0855]  Enc Vitals Group     BP (!) 158/133     Pulse Rate 79     Resp 20     Temp 98.7 F (37.1 C)     Temp Source Oral     SpO2 100 %     Weight 149 lb (67.6 kg)     Height '5\' 1"'  (1.549 m)     Head Circumference      Peak Flow      Pain Score 9     Pain Loc      Pain Edu?      Excl. in [place]?     Constitutional: Alert and oriented x4 appears somewhat uncomfortable nontoxic no diaphoresis speaks in clear sentences Eyes: PERRL EOMI. midrange and brisk Head: 2 cm laceration to right forehead. Nose: No congestion/rhinnorhea. Mouth/Throat: No trismus Neck: No stridor.   Cardiovascular: Normal rate, regular rhythm. Grossly normal heart sounds.  Good peripheral circulation. Respiratory: Normal respiratory effort.  No retractions. Lungs CTAB and moving good air Gastrointestinal: Soft  nontender Musculoskeletal: Dinner fork deformity to right wrist.  Skin closed.  Neurovascularly intact. Able to fully supinate and pronate.  No tenderness over radial head Neurologic:  Normal speech and language. No gross focal neurologic deficits are appreciated. Skin:  Skin is warm, dry and intact. No rash noted. Psychiatric: Mood and affect are normal. Speech and behavior are normal.    ____________________________________________   DIFFERENTIAL includes but not limited to  Colles' fracture, mentation fracture, Galeazzi fracture, laceration, intracerebral hemorrhage ____________________________________________   LABS (all labs ordered are listed, but only abnormal results are displayed)  Labs Reviewed - No data to display   __________________________________________  EKG   ____________________________________________  RADIOLOGY  Head and cervical spine CTs reviewed by me with no acute  disease Right wrist x-ray reviewed by me with comminuted Colles' fracture Repeat right wrist x-ray reviewed by me with adequate reduction ____________________________________________   PROCEDURES  Procedure(s) performed: Yes  .Sedation Date/Time: 11/23/2017 10:49 AM Performed by: Darel Hong, MD Authorized by: Darel Hong, MD   Consent:    Consent obtained:  Verbal   Consent given by:  Patient   Risks discussed:  Allergic reaction, dysrhythmia, inadequate sedation, nausea, prolonged hypoxia resulting in organ damage, prolonged sedation necessitating reversal, respiratory compromise necessitating ventilatory assistance and intubation and vomiting   Alternatives discussed:  Analgesia without sedation, anxiolysis and regional anesthesia Universal protocol:    Procedure explained and questions answered to patient or proxy's satisfaction: yes     Relevant documents present and verified: yes     Test results available and properly labeled: yes     Imaging studies available: yes      Required blood products, implants, devices, and special equipment available: yes     Site/side marked: yes     Immediately prior to procedure a time out was called: yes     Patient identity confirmation method:  Verbally with patient Indications:    Procedure necessitating sedation performed by:  Physician performing sedation   Intended level of sedation:  Deep Pre-sedation assessment:    ASA classification: class 1 - normal, healthy patient     Neck mobility: normal     Mouth opening:  3 or more finger widths   Thyromental distance:  4 finger widths   Mallampati score:  I - soft palate, uvula, fauces, pillars visible   Pre-sedation assessments completed and reviewed: airway patency, cardiovascular function, hydration status, mental status, nausea/vomiting, pain level, respiratory function and temperature     Pre-sedation assessment completed:  11/23/2017 10:49 AM Immediate pre-procedure details:    Reassessment: Patient reassessed immediately prior to procedure     Reviewed: vital signs, relevant labs/tests and NPO status     Verified: bag valve mask available, emergency equipment available, intubation equipment available, IV patency confirmed, oxygen available and suction available   Procedure details (see MAR for exact dosages):    Preoxygenation:  Nasal cannula   Sedation:  Propofol   Intra-procedure monitoring:  Blood pressure monitoring, cardiac monitor, continuous pulse oximetry, frequent LOC assessments, frequent vital sign checks and continuous capnometry   Intra-procedure events: none     Total Provider sedation time (minutes):  20 Post-procedure details:    Post-sedation assessment completed:  11/23/2017 11:24 AM   Attendance: Constant attendance by certified staff until patient recovered     Recovery: Patient returned to pre-procedure baseline     Post-sedation assessments completed and reviewed: airway patency, cardiovascular function, hydration status, mental status,  nausea/vomiting, pain level, respiratory function and temperature     Patient is stable for discharge or admission: yes     Patient tolerance:  Tolerated well, no immediate complications Reduction of fracture Date/Time: 11/23/2017 11:24 AM Performed by: Darel Hong, MD Authorized by: Darel Hong, MD  Consent: Verbal consent obtained. Consent given by: patient Patient understanding: patient states understanding of the procedure being performed Patient consent: the patient's understanding of the procedure matches consent given Procedure consent: procedure consent matches procedure scheduled Relevant documents: relevant documents present and verified Test results: test results available and properly labeled Site marked: the operative site was marked Imaging studies: imaging studies available Required items: required blood products, implants, devices, and special equipment available Patient identity confirmed: verbally with patient, arm band and provided demographic data Local anesthesia  used: yes Anesthesia: hematoma block  Anesthesia: Local anesthesia used: yes Local Anesthetic: bupivacaine 0.5% without epinephrine Anesthetic total: 15 mL  Sedation: Patient sedated: yes Sedation type: moderate (conscious) sedation Sedatives: propofol  Patient tolerance: Patient tolerated the procedure well with no immediate complications  .Nerve Block Date/Time: 11/23/2017 11:25 AM Performed by: Darel Hong, MD Authorized by: Darel Hong, MD   Consent:    Consent obtained:  Verbal   Consent given by:  Patient   Risks discussed:  Infection, nerve damage, swelling, unsuccessful block and pain   Alternatives discussed:  Alternative treatment Indications:    Indications:  Procedural anesthesia Location:    Body area:  Upper extremity   Laterality:  Right Pre-procedure details:    Skin preparation:  2% chlorhexidine Skin anesthesia (see MAR for exact dosages):    Skin  anesthesia method:  None Procedure details (see MAR for exact dosages):    Block needle gauge:  21 G   Anesthetic injected:  Bupivacaine 0.5% w/o epi   Steroid injected:  None   Additive injected:  None   Injection procedure:  Anatomic landmarks identified and anatomic landmarks palpated   Paresthesia:  None Post-procedure details:    Dressing:  None   Patient tolerance of procedure:  Tolerated well, no immediate complications Comments:     Hematoma block performed with flash of blood then easy infusion of 15cc of bupivicaine .Marland KitchenLaceration Repair Date/Time: 11/24/2017 11:30 AM Performed by: Darel Hong, MD Authorized by: Darel Hong, MD   Consent:    Consent obtained:  Verbal   Consent given by:  Patient   Risks discussed:  Infection, pain, retained foreign body, poor cosmetic result and poor wound healing Anesthesia (see MAR for exact dosages):    Anesthesia method:  Local infiltration   Local anesthetic:  Bupivacaine 0.5% w/o epi Laceration details:    Location:  Face   Face location:  Forehead   Length (cm):  2 Repair type:    Repair type:  Simple Exploration:    Hemostasis achieved with:  Direct pressure   Wound exploration: entire depth of wound probed and visualized     Contaminated: no   Treatment:    Area cleansed with:  Saline   Amount of cleaning:  Extensive   Irrigation solution:  Sterile saline   Visualized foreign bodies/material removed: no   Skin repair:    Repair method:  Sutures   Suture size:  6-0   Suture material:  Nylon Approximation:    Approximation:  Close Post-procedure details:    Dressing:  Sterile dressing   Patient tolerance of procedure:  Tolerated well, no immediate complications    Critical Care performed: no  Observation: no ____________________________________________   INITIAL IMPRESSION / ASSESSMENT AND PLAN / ED COURSE  Pertinent labs & imaging results that were available during my care of the patient were reviewed  by me and considered in my medical decision making (see chart for details).  The patient arrives with a mechanical fall and obvious right wrist fracture and head trauma.  Given her advanced age and the hyperextension injury head and cervical spine x-rays obtained which fortunately are negative.  I discussed the patient hematoma block and reduction of her Colles' fracture versus sedation and she opted for sedation given her severe pain which I think is reasonable.  The patient was sedated with propofol and I then performed a hematoma block and reduced her fracture and placed in a sugar tong splint.  While she was unconscious I  also repaired her facial laceration after irrigating copiously.  The patient tolerated the procedure well.  I do appreciate that she does have some over reduction and slightly volar aspect of her wrist however it is currently adequate.  Will be discharged home with pain medication and Ortho follow-up.  She verbalizes understanding agreement with plan.  Strict return precautions have been given.      ____________________________________________   FINAL CLINICAL IMPRESSION(S) / ED DIAGNOSES  Final diagnoses:  Facial laceration, initial encounter  Minor head injury, initial encounter  Closed Colles' fracture of right radius, initial encounter  Closed displaced fracture of styloid process of right ulna, initial encounter  Closed chip fracture of triquetrum of right wrist, initial encounter      NEW MEDICATIONS STARTED DURING THIS VISIT:  Discharge Medication List as of 11/23/2017 12:08 PM    START taking these medications   Details  HYDROcodone-acetaminophen (NORCO) 5-325 MG tablet Take 1 tablet by mouth every 6 (six) hours as needed for up to 11 doses for severe pain., Starting Mon 11/23/2017, Print         Note:  This document was prepared using Dragon voice recognition software and may include unintentional dictation errors.     Darel Hong, MD 11/25/17  [phone]

## 2017-11-23 NOTE — Sedation Documentation (Signed)
Splint applied by this RN and MD.

## 2017-11-23 NOTE — ED Triage Notes (Signed)
Pt reports was standing on a chair to put some dishes away and fell hitting her head on a ceramic tile floor and hurting her right forearm. Pt with deformity noted to right wrist and small laceration noted above right eye. Pt denies LOC.

## 2017-11-23 NOTE — Discharge Instructions (Addendum)
It was a pleasure to take care of you today, and thank you for coming to our emergency department.  If you have any questions or concerns before leaving please ask the nurse to grab me and I'm more than happy to go through your aftercare instructions again.  If you were prescribed any opioid pain medication today such as Norco, Vicodin, Percocet, morphine, hydrocodone, or oxycodone please make sure you do not drive when you are taking this medication as it can alter your ability to drive safely.  If you have any concerns once you are home that you are not improving or are in fact getting worse before you can make it to your follow-up appointment, please do not hesitate to call [phone] and come back for further evaluation.  Darel Hong, MD  Results for orders placed or performed in visit on 02/12/17  Renal Function Panel  Result Value Ref Range   Glucose 94 65 - 99 mg/dL   BUN 16 8 - 27 mg/dL   Creatinine, Ser 0.92 0.57 - 1.00 mg/dL   GFR calc non Af Amer 60 >59 mL/min/1.73   GFR calc Af Amer 69 >59 mL/min/1.73   BUN/Creatinine Ratio 17 12 - 28   Sodium 142 134 - 144 mmol/L   Potassium 4.1 3.5 - 5.2 mmol/L   Chloride 101 96 - 106 mmol/L   CO2 28 20 - 29 mmol/L   Calcium 9.7 8.7 - 10.3 mg/dL   Phosphorus 3.8 2.5 - 4.5 mg/dL   Albumin 4.4 3.5 - 4.8 g/dL   Dg Wrist Complete Right  Result Date: 11/23/2017 CLINICAL DATA:  Status post reduction of right wrist. EXAM: RIGHT WRIST - COMPLETE 3+ VIEW COMPARISON:  11/23/2017 FINDINGS: Exam detail diminished due to overlying casting material. Interval reduction of distal radius fracture. There is mild volar displacement of the distal fracture fragments by approximately 5 mm. Unchanged appearance of ulnar styloid fracture. IMPRESSION: 1. Status post reduction of distal radius fracture. There is now mild volar displacement of the distal fracture fragments by 5 mm. Electronically Signed   By: Kerby Moors M.D.   On: 11/23/2017 11:50   Dg Wrist Complete  Right  Result Date: 11/23/2017 CLINICAL DATA:  [place] landing on the right wrist with deformity EXAM: RIGHT WRIST - COMPLETE 3+ VIEW COMPARISON:  None. FINDINGS: There is a comminuted, displaced, and angulated fracture of the distal right radius with the distal fragment dorsally displaced. There is also fracture of the ulnar styloid. On the lateral view, a fracture of the triquetrum cannot be excluded. IMPRESSION: 1. Comminuted displaced and angulated fracture of the distal right radius with fracture of the ulnar styloid as well. 2. Suspect fracture of the triquetrum as well on the lateral view. Electronically Signed   By: Ivar Drape M.D.   On: 11/23/2017 10:12   Ct Head Wo Contrast  Result Date: 11/23/2017 CLINICAL DATA:  82 year old female fell off of chair hitting head. Small laceration above right eye. Denies loss of consciousness. EXAM: CT HEAD WITHOUT CONTRAST CT CERVICAL SPINE WITHOUT CONTRAST TECHNIQUE: Multidetector CT imaging of the head and cervical spine was performed following the standard protocol without intravenous contrast. Multiplanar CT image reconstructions of the cervical spine were also generated. COMPARISON:  None. FINDINGS: CT HEAD FINDINGS Brain: No intracranial hemorrhage or CT evidence of large acute infarct. No intracranial mass lesion noted on this unenhanced exam. Vascular: Vascular calcifications Skull: No skull fracture Sinuses/Orbits: Opacification right sphenoid sinus with sclerotic border suggesting chronic sinusitis. Laceration adjacent  to the lateral aspect the right orbit. No underlying fracture noted. CT CERVICAL SPINE FINDINGS Alignment: Mild rotation C1 upon C2 felt to be related to head positioning. Skull base and vertebrae: No cervical spine fracture. Soft tissues and spinal canal: No abnormal prevertebral soft tissue swelling. Disc levels:  Cervical spondylotic changes most notable C5-6. Upper chest: No worrisome lung apical mass. Other: Negative. IMPRESSION: Small  laceration adjacent to the right lateral orbit. Partial visualization of right orbit without fracture noted. No skull fracture or intracranial hemorrhage. No cervical spine fracture or abnormal prevertebral soft tissue swelling. Rotation of head/C1 upon C2 probably related to head positioning. Electronically Signed   By: Genia Del M.D.   On: 11/23/2017 10:39   Ct Cervical Spine Wo Contrast  Result Date: 11/23/2017 CLINICAL DATA:  82 year old female fell off of chair hitting head. Small laceration above right eye. Denies loss of consciousness. EXAM: CT HEAD WITHOUT CONTRAST CT CERVICAL SPINE WITHOUT CONTRAST TECHNIQUE: Multidetector CT imaging of the head and cervical spine was performed following the standard protocol without intravenous contrast. Multiplanar CT image reconstructions of the cervical spine were also generated. COMPARISON:  None. FINDINGS: CT HEAD FINDINGS Brain: No intracranial hemorrhage or CT evidence of large acute infarct. No intracranial mass lesion noted on this unenhanced exam. Vascular: Vascular calcifications Skull: No skull fracture Sinuses/Orbits: Opacification right sphenoid sinus with sclerotic border suggesting chronic sinusitis. Laceration adjacent to the lateral aspect the right orbit. No underlying fracture noted. CT CERVICAL SPINE FINDINGS Alignment: Mild rotation C1 upon C2 felt to be related to head positioning. Skull base and vertebrae: No cervical spine fracture. Soft tissues and spinal canal: No abnormal prevertebral soft tissue swelling. Disc levels:  Cervical spondylotic changes most notable C5-6. Upper chest: No worrisome lung apical mass. Other: Negative. IMPRESSION: Small laceration adjacent to the right lateral orbit. Partial visualization of right orbit without fracture noted. No skull fracture or intracranial hemorrhage. No cervical spine fracture or abnormal prevertebral soft tissue swelling. Rotation of head/C1 upon C2 probably related to head positioning.  Electronically Signed   By: Genia Del M.D.   On: 11/23/2017 10:39

## 2017-11-26 DIAGNOSIS — S52501A Unspecified fracture of the lower end of right radius, initial encounter for closed fracture: Secondary | ICD-10-CM | POA: Diagnosis not present

## 2017-12-22 DIAGNOSIS — S52501A Unspecified fracture of the lower end of right radius, initial encounter for closed fracture: Secondary | ICD-10-CM | POA: Diagnosis not present

## 2017-12-31 DIAGNOSIS — S52501D Unspecified fracture of the lower end of right radius, subsequent encounter for closed fracture with routine healing: Secondary | ICD-10-CM | POA: Diagnosis not present

## 2018-01-18 ENCOUNTER — Encounter: Payer: Self-pay | Admitting: Family Medicine

## 2018-01-18 ENCOUNTER — Ambulatory Visit (INDEPENDENT_AMBULATORY_CARE_PROVIDER_SITE_OTHER): Payer: Medicare HMO | Admitting: Family Medicine

## 2018-01-18 VITALS — BP 130/76 | HR 76 | Ht 62.0 in | Wt 153.0 lb

## 2018-01-18 DIAGNOSIS — I10 Essential (primary) hypertension: Secondary | ICD-10-CM | POA: Diagnosis not present

## 2018-01-18 DIAGNOSIS — E7800 Pure hypercholesterolemia, unspecified: Secondary | ICD-10-CM

## 2018-01-18 DIAGNOSIS — J301 Allergic rhinitis due to pollen: Secondary | ICD-10-CM | POA: Diagnosis not present

## 2018-01-18 MED ORDER — MOMETASONE FUROATE 50 MCG/ACT NA SUSP
2.0000 | Freq: Every day | NASAL | 12 refills | Status: DC
Start: 2018-01-18 — End: 2018-07-23

## 2018-01-18 MED ORDER — LISINOPRIL-HYDROCHLOROTHIAZIDE 20-12.5 MG PO TABS: 1.0000 | ORAL_TABLET | Freq: Every day | ORAL | 1 refills | Status: DC

## 2018-01-18 MED ORDER — MONTELUKAST SODIUM 10 MG PO TABS: 10.0000 mg | ORAL_TABLET | Freq: Every day | ORAL | 12 refills | Status: DC

## 2018-01-18 NOTE — Assessment & Plan Note (Signed)
Controlled. Continue Zestoretic 20/12.5 daily and check renal panel.

## 2018-01-18 NOTE — Assessment & Plan Note (Signed)
Conttrolled. Continue Zestoretic 20/12.5 daily and check renal panel.

## 2018-01-18 NOTE — Progress Notes (Signed)
Contininue lisinopril 31m/Name: Kristin Miller  MRN: [id number]   DOB: 801-18-1937  Date:01/18/2018       Progress Note  Subjective  Chief Complaint  Chief Complaint  Patient presents with  . Allergic Rhinitis   . Hypertension    needs labs    Hypertension  This is a chronic problem. The current episode started more than 1 year ago. The problem is unchanged. The problem is controlled. Pertinent negatives include no anxiety, blurred vision, chest pain, headaches, malaise/fatigue, neck pain, orthopnea, palpitations, peripheral edema, PND, shortness of breath or sweats. There are no associated agents to hypertension. Risk factors for coronary artery disease include dyslipidemia. Past treatments include ACE inhibitors and diuretics. The current treatment provides moderate improvement. There are no compliance problems.  There is no history of angina, kidney disease, CAD/MI, CVA, heart failure, left ventricular hypertrophy, PVD or retinopathy. There is no history of chronic renal disease, a hypertension causing med or renovascular disease.  Hyperlipidemia  This is a new problem. The current episode started more than 1 year ago. The problem is controlled. Recent lipid tests were reviewed and are normal. She has no history of chronic renal disease, diabetes, hypothyroidism, liver disease, obesity or nephrotic syndrome. Factors aggravating her hyperlipidemia include thiazides. Pertinent negatives include no chest pain, focal sensory loss, focal weakness, leg pain, myalgias or shortness of breath. Current antihyperlipidemic treatment includes diet change. The current treatment provides moderate improvement of lipids. There are no compliance problems.  Risk factors for coronary artery disease include dyslipidemia, diabetes mellitus and hypertension.    BP (high blood pressure) Controlled. Continue Zestoretic 20/12.5 daily and check renal panel.  HTN (hypertension) Conttrolled. Continue Zestoretic  20/12.5 daily and check renal panel.   Past Medical History:  Diagnosis Date  . Allergy   . Anemia    H/O AS A CHILD  . Arthritis   . Back pain   . Breast cancer of upper-outer quadrant of right female breast ([place] 06/12/2015   4 millimeter, T1a, N0; ER 90%, PR 0%, HER-2/neu not overexpressed.  Declined radiation therapy.  . Collar bone fracture 05/28/2013   Fractured Collar bone on left  . Dysrhythmia    h/o heart skipping beat years ago  . Frequent headaches    h/o migraines  . Hypertension   . Osteoporosis     Past Surgical History:  Procedure Laterality Date  . ABDOMINAL HYSTERECTOMY  1970   Partial  . BREAST BIOPSY Right 05/28/2015   INVASIVE MAMMARY CARCINOMA   . BREAST BIOPSY Right 06/12/2015   Wide excision, sentinel lymph node biopsy.  .Marland KitchenBREAST LUMPECTOMY Right 2016  . BREAST LUMPECTOMY WITH AXILLARY LYMPH NODE DISSECTION Right 06/12/2015        . CATARACT EXTRACTION W/PHACO Left 02/03/2017   Procedure: CATARACT EXTRACTION PHACO AND INTRAOCULAR LENS PLACEMENT (IOC);  Surgeon: PBirder Robson MD;  Location: [facility] ORS;  Service: Ophthalmology;  Laterality: Left;  [place] 00:28 AP% 16.3 CDE 4.68 Fluid pack lot # [id number]  . COLONOSCOPY      Family History  Problem Relation Age of Onset  . Mental illness Mother        Dementia  . Stroke Father   . Cancer Brother        Lung Cancer  . Breast cancer Other     Social History   Socioeconomic History  . Marital status: Married    Spouse name: Not on file  . Number of children: 3  . Years of  education: GED  . Highest education level: 12th grade  Occupational History  . Occupation: Retired  Scientific laboratory technician  . Financial resource strain: Not hard at all  . Food insecurity:    Worry: Never true    Inability: Never true  . Transportation needs:    Medical: No    Non-medical: No  Tobacco Use  . Smoking status: Never Smoker  . Smokeless tobacco: Never Used  . Tobacco comment: smoking cessation materials not  required  Substance and Sexual Activity  . Alcohol use: No  . Drug use: No  . Sexual activity: Not Currently  Lifestyle  . Physical activity:    Days per week: 3 days    Minutes per session: 30 min  . Stress: Not at all  Relationships  . Social connections:    Talks on phone: Patient refused    Gets together: Patient refused    Attends religious service: Patient refused    Active member of club or organization: Patient refused    Attends meetings of clubs or organizations: Patient refused    Relationship status: Married  . Intimate partner violence:    Fear of current or ex partner: No    Emotionally abused: No    Physically abused: No    Forced sexual activity: No  Other Topics Concern  . Not on file  Social History Narrative  . Not on file    Allergies  Allergen Reactions  . Other Rash    SUTURES-(PARTIAL HYSTERECTOMY)    Outpatient Medications Prior to Visit  Medication Sig Dispense Refill  . acetaminophen (TYLENOL) 500 MG tablet Take 500 mg by mouth every 6 (six) hours as needed for pain.    . Calcium-Vitamin D (CALTRATE 600 PLUS-VIT D PO) Take 2 tablets by mouth daily.    . cetirizine (ZYRTEC) 10 MG tablet Take 10 mg by mouth as needed.     Marland Kitchen letrozole (FEMARA) 2.5 MG tablet TAKE ONE TABLET BY MOUTH ONCE DAILY 30 tablet 12  . Multiple Vitamins-Minerals (CENTRUM SILVER ULTRA WOMENS PO) Take 1 tablet by mouth daily.    Marland Kitchen lisinopril-hydrochlorothiazide (PRINZIDE,ZESTORETIC) 20-12.5 MG tablet TAKE 1 TABLET BY MOUTH ONCE DAILY 90 tablet 1  . mometasone (NASONEX) 50 MCG/ACT nasal spray Place 2 sprays into the nose daily. 17 g 12  . montelukast (SINGULAIR) 10 MG tablet Take 1 tablet (10 mg total) by mouth at bedtime. 30 tablet 3  . HYDROcodone-acetaminophen (NORCO) 5-325 MG tablet Take 1 tablet by mouth every 6 (six) hours as needed for up to 11 doses for severe pain. 11 tablet 0   No facility-administered medications prior to visit.     Review of Systems   Constitutional: Negative for chills, fever, malaise/fatigue and weight loss.  HENT: Negative for ear discharge, ear pain and sore throat.   Eyes: Negative for blurred vision.  Respiratory: Negative for cough, sputum production, shortness of breath and wheezing.   Cardiovascular: Negative for chest pain, palpitations, orthopnea, leg swelling and PND.  Gastrointestinal: Negative for abdominal pain, blood in stool, constipation, diarrhea, heartburn, melena and nausea.  Genitourinary: Negative for dysuria, frequency, hematuria and urgency.  Musculoskeletal: Negative for back pain, joint pain, myalgias and neck pain.  Skin: Negative for rash.  Neurological: Negative for dizziness, tingling, sensory change, focal weakness and headaches.  Endo/Heme/Allergies: Negative for environmental allergies and polydipsia. Does not bruise/bleed easily.  Psychiatric/Behavioral: Negative for depression and suicidal ideas. The patient is not nervous/anxious and does not have insomnia.  Objective  Vitals:   01/18/18 0900  BP: 130/76  Pulse: 76  Weight: 153 lb (69.4 kg)  Height: '5\' 2"'  (1.575 m)    Physical Exam  Constitutional: She is oriented to person, place, and time. She appears well-developed and well-nourished.  HENT:  Head: Normocephalic.  Right Ear: External ear normal.  Left Ear: External ear normal.  Mouth/Throat: Oropharynx is clear and moist.  Eyes: Pupils are equal, round, and reactive to light. Conjunctivae and EOM are normal. Lids are everted and swept, no foreign bodies found. Left eye exhibits no hordeolum. No foreign body present in the left eye. Right conjunctiva is not injected. Left conjunctiva is not injected. No scleral icterus.  Neck: Normal range of motion. Neck supple. No JVD present. No tracheal deviation present. No thyromegaly present.  Cardiovascular: Normal rate, regular rhythm, S1 normal, S2 normal, normal heart sounds and intact distal pulses. PMI is not displaced.  Exam reveals no gallop, no S3, no S4 and no friction rub.  No murmur heard.  No systolic murmur is present.  No diastolic murmur is present. Pulmonary/Chest: Effort normal and breath sounds normal. No respiratory distress. She has no wheezes. She has no rales.  Abdominal: Soft. Bowel sounds are normal. She exhibits no mass. There is no hepatosplenomegaly. There is no tenderness. There is no rebound and no guarding.  Musculoskeletal: Normal range of motion. She exhibits no edema or tenderness.  Lymphadenopathy:    She has no cervical adenopathy.  Neurological: She is alert and oriented to person, place, and time. She has normal strength. She displays normal reflexes. No cranial nerve deficit.  Skin: Skin is warm. No rash noted.  Psychiatric: She has a normal mood and affect. Her mood appears not anxious. She does not exhibit a depressed mood.  Nursing note and vitals reviewed.     Assessment & Plan  Problem List Items Addressed This Visit      Cardiovascular and Mediastinum   HTN (hypertension) - Primary    Conttrolled. Continue Zestoretic 20/12.5 daily and check renal panel.      Relevant Medications   lisinopril-hydrochlorothiazide (PRINZIDE,ZESTORETIC) 20-12.5 MG tablet   Other Relevant Orders   Renal Function Panel    Other Visit Diagnoses    Seasonal allergic rhinitis due to pollen       stable Continue nasonex and singulair prn.   Relevant Medications   montelukast (SINGULAIR) 10 MG tablet   mometasone (NASONEX) 50 MCG/ACT nasal spray   Pure hypercholesterolemia       previously elevated LDL. will check lipid panel.   Relevant Medications   lisinopril-hydrochlorothiazide (PRINZIDE,ZESTORETIC) 20-12.5 MG tablet   Other Relevant Orders   Lipid panel      Meds ordered this encounter  Medications  . lisinopril-hydrochlorothiazide (PRINZIDE,ZESTORETIC) 20-12.5 MG tablet    Sig: Take 1 tablet by mouth daily.    Dispense:  90 tablet    Refill:  1  . montelukast  (SINGULAIR) 10 MG tablet    Sig: Take 1 tablet (10 mg total) by mouth at bedtime.    Dispense:  30 tablet    Refill:  12  . mometasone (NASONEX) 50 MCG/ACT nasal spray    Sig: Place 2 sprays into the nose daily.    Dispense:  17 g    Refill:  12      Dr. Macon [facility]  01/18/18

## 2018-01-19 LAB — RENAL FUNCTION PANEL
Albumin: 4.3 g/dL (ref 3.5–4.8)
BUN / CREAT RATIO: 25 (ref 12–28)
BUN: 17 mg/dL (ref 8–27)
CHLORIDE: 104 mmol/L (ref 96–106)
CO2: 25 mmol/L (ref 20–29)
Calcium: 9.8 mg/dL (ref 8.7–10.3)
Creatinine, Ser: 0.69 mg/dL (ref 0.57–1.00)
GFR calc non Af Amer: 83 mL/min/{1.73_m2} (ref >59)
GFR, EST AFRICAN AMERICAN: 96 mL/min/{1.73_m2} (ref >59)
Glucose: 89 mg/dL (ref 65–99)
Phosphorus: 3.5 mg/dL (ref 2.5–4.5)
Potassium: 4.7 mmol/L (ref 3.5–5.2)
SODIUM: 142 mmol/L (ref 134–144)

## 2018-01-19 LAB — LIPID PANEL
Chol/HDL Ratio: 3.8 ratio (ref 0.0–4.4)
Cholesterol, Total: 195 mg/dL (ref 100–199)
HDL: 51 mg/dL (ref >39)
LDL CALC: 121 mg/dL — AB (ref 0–99)
Triglycerides: 117 mg/dL (ref 0–149)
VLDL CHOLESTEROL CAL: 23 mg/dL (ref 5–40)

## 2018-02-08 DIAGNOSIS — M1990 Unspecified osteoarthritis, unspecified site: Secondary | ICD-10-CM | POA: Diagnosis not present

## 2018-02-08 DIAGNOSIS — C50919 Malignant neoplasm of unspecified site of unspecified female breast: Secondary | ICD-10-CM | POA: Diagnosis not present

## 2018-02-08 DIAGNOSIS — M810 Age-related osteoporosis without current pathological fracture: Secondary | ICD-10-CM | POA: Diagnosis not present

## 2018-02-08 DIAGNOSIS — R32 Unspecified urinary incontinence: Secondary | ICD-10-CM | POA: Diagnosis not present

## 2018-02-08 DIAGNOSIS — Z791 Long term (current) use of non-steroidal anti-inflammatories (NSAID): Secondary | ICD-10-CM | POA: Diagnosis not present

## 2018-02-08 DIAGNOSIS — Z79811 Long term (current) use of aromatase inhibitors: Secondary | ICD-10-CM | POA: Diagnosis not present

## 2018-02-08 DIAGNOSIS — J309 Allergic rhinitis, unspecified: Secondary | ICD-10-CM | POA: Diagnosis not present

## 2018-02-08 DIAGNOSIS — G8929 Other chronic pain: Secondary | ICD-10-CM | POA: Diagnosis not present

## 2018-02-08 DIAGNOSIS — I10 Essential (primary) hypertension: Secondary | ICD-10-CM | POA: Diagnosis not present

## 2018-02-08 DIAGNOSIS — K08409 Partial loss of teeth, unspecified cause, unspecified class: Secondary | ICD-10-CM | POA: Diagnosis not present

## 2018-03-29 ENCOUNTER — Other Ambulatory Visit: Payer: Self-pay

## 2018-03-29 DIAGNOSIS — C50511 Malignant neoplasm of lower-outer quadrant of right female breast: Secondary | ICD-10-CM

## 2018-03-29 DIAGNOSIS — Z170 Estrogen receptor positive status [ER+]: Principal | ICD-10-CM

## 2018-04-06 ENCOUNTER — Other Ambulatory Visit: Payer: Self-pay

## 2018-04-06 DIAGNOSIS — Z170 Estrogen receptor positive status [ER+]: Principal | ICD-10-CM

## 2018-04-06 DIAGNOSIS — C50511 Malignant neoplasm of lower-outer quadrant of right female breast: Secondary | ICD-10-CM

## 2018-04-06 DIAGNOSIS — Z780 Asymptomatic menopausal state: Secondary | ICD-10-CM

## 2018-04-12 ENCOUNTER — Telehealth: Payer: Self-pay

## 2018-04-12 NOTE — Telephone Encounter (Signed)
LM that [facility] Falling called her and said that HCTZ has cancer causing agents in it and she needs to call PCP or pharmacy

## 2018-04-12 NOTE — Telephone Encounter (Signed)
I contacted Kristin Miller was told that their customers are being notified if they received that certain lot number. This patient is good on med

## 2018-04-20 DIAGNOSIS — M8580 Other specified disorders of bone density and structure, unspecified site: Secondary | ICD-10-CM

## 2018-04-20 HISTORY — DX: Other specified disorders of bone density and structure, unspecified site: M85.80

## 2018-05-11 ENCOUNTER — Ambulatory Visit
Admission: RE | Admit: 2018-05-11 | Discharge: 2018-05-11 | Disposition: A | Discharge status: Home / Self Care | Payer: Medicare HMO | Source: Ambulatory Visit | Attending: General Surgery | Admitting: General Surgery

## 2018-05-11 DIAGNOSIS — C50511 Malignant neoplasm of lower-outer quadrant of right female breast: Secondary | ICD-10-CM

## 2018-05-11 DIAGNOSIS — Z780 Asymptomatic menopausal state: Secondary | ICD-10-CM | POA: Insufficient documentation

## 2018-05-11 DIAGNOSIS — M85852 Other specified disorders of bone density and structure, left thigh: Secondary | ICD-10-CM | POA: Diagnosis not present

## 2018-05-11 DIAGNOSIS — Z170 Estrogen receptor positive status [ER+]: Principal | ICD-10-CM

## 2018-05-11 DIAGNOSIS — Z853 Personal history of malignant neoplasm of breast: Secondary | ICD-10-CM | POA: Diagnosis not present

## 2018-05-12 ENCOUNTER — Telehealth: Payer: Self-pay

## 2018-05-12 ENCOUNTER — Encounter: Payer: Self-pay | Admitting: General Surgery

## 2018-05-12 NOTE — Telephone Encounter (Signed)
Notified patient as instructed, patient pleased. Discussed follow-up appointments, patient agrees  

## 2018-05-12 NOTE — Telephone Encounter (Signed)
-----   Message from Robert Bellow, MD sent at 05/12/2018  1:32 PM EDT -----  Please let the patient know that the bone density shows no significant change.  Still thin but at the level of osteoporosis.  Continue vitamin D and calcium supplements.  Results have been forwarded to her PCP. ----- Message ----- From: Interface, Rad Results In Sent: 05/11/2018   2:38 PM EDT To: Robert Bellow, MD

## 2018-05-17 ENCOUNTER — Ambulatory Visit: Payer: Medicare HMO | Admitting: General Surgery

## 2018-05-17 ENCOUNTER — Encounter: Payer: Self-pay | Admitting: General Surgery

## 2018-05-17 VITALS — BP 132/66 | HR 70 | Resp 14 | Ht 60.0 in | Wt 153.0 lb

## 2018-05-17 DIAGNOSIS — Z170 Estrogen receptor positive status [ER+]: Secondary | ICD-10-CM | POA: Diagnosis not present

## 2018-05-17 DIAGNOSIS — C50511 Malignant neoplasm of lower-outer quadrant of right female breast: Secondary | ICD-10-CM

## 2018-05-17 NOTE — Patient Instructions (Signed)
The patient has been asked to return to the office in one year with a bilateral diagnostic mammogram.The patient is aware to call back for any questions or concerns. 

## 2018-05-17 NOTE — Progress Notes (Signed)
Patient ID: Kristin Miller, female   DOB: 01-07-1936, 82 y.o.   MRN: [id number]  Chief Complaint  Patient presents with  . Follow-up    HPI Kristin Miller is a 82 y.o. female who presents for a breast evaluation. The most recent mammogram was done on 05/11/2018.  Patient does perform regular self breast checks and gets regular mammograms done.    HPI  Past Medical History:  Diagnosis Date  . Allergy   . Anemia    H/O AS A CHILD  . Arthritis   . Back pain   . Breast cancer of upper-outer quadrant of right female breast ([place]) 06/12/2015   4 millimeter, T1a, N0; ER 90%, PR 0%, HER-2/neu not overexpressed.  Declined radiation therapy.  . Collar bone fracture 05/28/2013   Fractured Collar bone on left  . Dysrhythmia    h/o heart skipping beat years ago  . Frequent headaches    h/o migraines  . Hypertension   . Osteopenia 04/2018   Bone density without significant interval change since 2017.    Past Surgical History:  Procedure Laterality Date  . ABDOMINAL HYSTERECTOMY  1970   Partial  . BREAST BIOPSY Right 05/28/2015   INVASIVE MAMMARY CARCINOMA   . BREAST BIOPSY Right 06/12/2015   Wide excision, sentinel lymph node biopsy.  Marland Kitchen BREAST LUMPECTOMY WITH AXILLARY LYMPH NODE DISSECTION Right 06/12/2015   4 mm, T1a,N0 withDCIS with clear margins. ER: 90%, PR 0%; Her 2 neu not overexpressed.   Marland Kitchen CATARACT EXTRACTION W/PHACO Left 02/03/2017   Procedure: CATARACT EXTRACTION PHACO AND INTRAOCULAR LENS PLACEMENT (IOC);  Surgeon: Birder Robson, MD;  Location: [facility] ORS;  Service: Ophthalmology;  Laterality: Left;  [place]  00:28 AP% 16.3 CDE 4.68 Fluid pack lot # [id number]  . COLONOSCOPY      Family History  Problem Relation Age of Onset  . Mental illness Mother        Dementia  . Stroke Father   . Cancer Brother        Lung Cancer  . Breast cancer Other     Social History Social History   Tobacco Use  . Smoking status: Never Smoker  . Smokeless tobacco: Never Used  .  Tobacco comment: smoking cessation materials not required  Substance Use Topics  . Alcohol use: No  . Drug use: No    Allergies  Allergen Reactions  . Other Rash    SUTURES-(PARTIAL HYSTERECTOMY)    Current Outpatient Medications  Medication Sig Dispense Refill  . acetaminophen (TYLENOL) 500 MG tablet Take 500 mg by mouth every 6 (six) hours as needed for pain.    . Calcium-Vitamin D (CALTRATE 600 PLUS-VIT D PO) Take 2 tablets by mouth daily.    . cetirizine (ZYRTEC) 10 MG tablet Take 10 mg by mouth as needed.     Marland Kitchen letrozole (FEMARA) 2.5 MG tablet TAKE ONE TABLET BY MOUTH ONCE DAILY 30 tablet 12  . lisinopril-hydrochlorothiazide (PRINZIDE,ZESTORETIC) 20-12.5 MG tablet Take 1 tablet by mouth daily. 90 tablet 1  . mometasone (NASONEX) 50 MCG/ACT nasal spray Place 2 sprays into the nose daily. 17 g 12  . montelukast (SINGULAIR) 10 MG tablet Take 1 tablet (10 mg total) by mouth at bedtime. 30 tablet 12  . Multiple Vitamins-Minerals (CENTRUM SILVER ULTRA WOMENS PO) Take 1 tablet by mouth daily.     No current facility-administered medications for this visit.     Review of Systems Review of Systems  Constitutional: Negative.   Respiratory: Negative.  Cardiovascular: Negative.     Blood pressure 132/66, pulse 70, resp. rate 14, height 5' (1.524 m), weight 153 lb (69.4 kg).  Physical Exam Physical Exam  Constitutional: She is oriented to person, place, and time. She appears well-developed and well-nourished.  Eyes: Conjunctivae are normal. No scleral icterus.  Neck: Neck supple.  Cardiovascular: Normal rate, regular rhythm and normal heart sounds.  Pulmonary/Chest: Effort normal and breath sounds normal. Right breast exhibits no inverted nipple, no mass, no nipple discharge, no skin change and no tenderness. Left breast exhibits no inverted nipple, no mass, no nipple discharge, no skin change and no tenderness.    Lymphadenopathy:    She has no cervical adenopathy.    She has  no axillary adenopathy.       Right: No supraclavicular adenopathy present.  Neurological: She is alert and oriented to person, place, and time.  Skin: Skin is warm and dry.    Data Reviewed Bilateral diagnostic mammograms dated March 11, 2018 reviewed.  Postsurgical change.  BI-RADS-2.  Bone density test of March 11, 2018 showed stable osteoporosis.  Assessment    Doing well now 3 years status post excision right breast T1 a cancer.    Good tolerance of letrozole.    Plan  The patient has been asked to return to the office in one year with a bilateral diagnostic mammogram.The patient is aware to call back for any questions or concerns.   HPI, Physical Exam, Assessment and Plan have been scribed under the direction and in the presence of Hervey Ard, MD.  Gaspar Cola, CMA  I have completed the exam and reviewed the above documentation for accuracy and completeness.  I agree with the above.  Haematologist has been used and any errors in dictation or transcription are unintentional.  Hervey Ard, M.D., F.A.C.S.  Kristin Miller 05/17/2018, 2:53 PM

## 2018-05-18 ENCOUNTER — Ambulatory Visit: Payer: Medicare HMO | Admitting: General Surgery

## 2018-05-19 ENCOUNTER — Telehealth: Payer: Self-pay

## 2018-05-19 NOTE — Telephone Encounter (Signed)
Faxed to [facility]!

## 2018-07-23 ENCOUNTER — Ambulatory Visit (INDEPENDENT_AMBULATORY_CARE_PROVIDER_SITE_OTHER): Payer: Medicare HMO | Admitting: Family Medicine

## 2018-07-23 ENCOUNTER — Encounter: Payer: Self-pay | Admitting: Family Medicine

## 2018-07-23 VITALS — BP 120/70 | HR 80 | Ht 60.0 in | Wt 152.0 lb

## 2018-07-23 DIAGNOSIS — Z23 Encounter for immunization: Secondary | ICD-10-CM | POA: Diagnosis not present

## 2018-07-23 DIAGNOSIS — J301 Allergic rhinitis due to pollen: Secondary | ICD-10-CM

## 2018-07-23 DIAGNOSIS — I10 Essential (primary) hypertension: Secondary | ICD-10-CM | POA: Diagnosis not present

## 2018-07-23 MED ORDER — LISINOPRIL-HYDROCHLOROTHIAZIDE 20-12.5 MG PO TABS: 1.0000 | ORAL_TABLET | Freq: Every day | ORAL | 1 refills | Status: DC

## 2018-07-23 MED ORDER — MONTELUKAST SODIUM 10 MG PO TABS: 10.0000 mg | ORAL_TABLET | Freq: Every day | ORAL | 1 refills | Status: DC

## 2018-07-23 MED ORDER — MOMETASONE FUROATE 50 MCG/ACT NA SUSP: 2.0000 | Freq: Every day | NASAL | 12 refills | Status: DC

## 2018-07-23 NOTE — Progress Notes (Signed)
Date:  07/23/2018   Name:  Kristin Miller   DOB:  Apr 05, 1936   MRN:  [id number]   Chief Complaint: Hypertension; Allergic Rhinitis ; Asthma; and Flu Vaccine  Hypertension  This is a chronic problem. The current episode started more than 1 year ago. The problem is unchanged. The problem is controlled. Pertinent negatives include no anxiety, blurred vision, chest pain, headaches, malaise/fatigue, neck pain, orthopnea, palpitations, peripheral edema, PND, shortness of breath or sweats. There are no associated agents to hypertension. There are no known risk factors for coronary artery disease. Past treatments include ACE inhibitors and diuretics. The current treatment provides moderate improvement. There is no history of angina, kidney disease, CAD/MI, CVA, heart failure, left ventricular hypertrophy, PVD or retinopathy. There is no history of chronic renal disease, a hypertension causing med or renovascular disease.  Asthma  There is no chest tightness, cough, difficulty breathing, frequent throat clearing, hemoptysis, hoarse voice, shortness of breath, sputum production or wheezing. This is a chronic problem. The current episode started more than 1 year ago. The problem occurs daily. The problem has been unchanged. Pertinent negatives include no appetite change, chest pain, dyspnea on exertion, ear congestion, ear pain, fever, headaches, heartburn, malaise/fatigue, myalgias, nasal congestion, orthopnea, PND, rhinorrhea, sneezing, sore throat, sweats, trouble swallowing or weight loss. Her symptoms are aggravated by URI. Relieved by: singulair. Her past medical history is significant for asthma. There is no history of bronchiectasis, bronchitis, COPD, emphysema or pneumonia.    Review of Systems  Constitutional: Negative.  Negative for appetite change, chills, fatigue, fever, malaise/fatigue, unexpected weight change and weight loss.  HENT: Negative for congestion, ear discharge, ear pain, hoarse  voice, rhinorrhea, sinus pressure, sneezing, sore throat and trouble swallowing.   Eyes: Negative for blurred vision, photophobia, pain, discharge, redness and itching.  Respiratory: Negative for cough, hemoptysis, sputum production, shortness of breath, wheezing and stridor.   Cardiovascular: Negative for chest pain, dyspnea on exertion, palpitations, orthopnea and PND.  Gastrointestinal: Negative for abdominal pain, blood in stool, constipation, diarrhea, heartburn, nausea and vomiting.  Endocrine: Negative for cold intolerance, heat intolerance, polydipsia, polyphagia and polyuria.  Genitourinary: Negative for dysuria, flank pain, frequency, hematuria, menstrual problem, pelvic pain, urgency, vaginal bleeding and vaginal discharge.  Musculoskeletal: Negative for arthralgias, back pain, myalgias and neck pain.  Skin: Negative for rash.  Allergic/Immunologic: Negative for environmental allergies and food allergies.  Neurological: Negative for dizziness, weakness, light-headedness, numbness and headaches.  Hematological: Negative for adenopathy. Does not bruise/bleed easily.  Psychiatric/Behavioral: Negative for dysphoric mood. The patient is not nervous/anxious.     Patient Active Problem List   Diagnosis Date Noted  . BP (high blood pressure) 11/15/2015  . OP (osteoporosis) 11/15/2015  . Primary cancer of lower-outer quadrant of right breast ([place]) 06/20/2015  . Medicare annual wellness visit, subsequent 10/09/2014  . Low back pain 08/02/2014  . Establishing care with new doctor, encounter for 08/02/2014  . HTN (hypertension) 08/02/2014  . Basal cell carcinoma in situ of skin of left shoulder 07/31/2014  . Microcalcifications of the breast 11/28/2013  . Abnormal finding on mammography 10/03/2013  . CA of skin 10/03/2013  . Mammographic microcalcification 05/04/2013  . Encounter for gynecological examination without abnormal finding 04/06/2013  . Chronic LBP 03/29/2012    Allergies    Allergen Reactions  . Other Rash    SUTURES-(PARTIAL HYSTERECTOMY)    Past Surgical History:  Procedure Laterality Date  . ABDOMINAL HYSTERECTOMY  1970   Partial  . BREAST  BIOPSY Right 05/28/2015   INVASIVE MAMMARY CARCINOMA   . BREAST BIOPSY Right 06/12/2015   Wide excision, sentinel lymph node biopsy.  Marland Kitchen BREAST LUMPECTOMY WITH AXILLARY LYMPH NODE DISSECTION Right 06/12/2015   4 mm, T1a,N0 withDCIS with clear margins. ER: 90%, PR 0%; Her 2 neu not overexpressed.   Marland Kitchen CATARACT EXTRACTION W/PHACO Left 02/03/2017   Procedure: CATARACT EXTRACTION PHACO AND INTRAOCULAR LENS PLACEMENT (IOC);  Surgeon: Birder Robson, MD;  Location: [facility] ORS;  Service: Ophthalmology;  Laterality: Left;  [place]  00:28 AP% 16.3 CDE 4.68 Fluid pack lot # [id number]  . COLONOSCOPY      Social History   Tobacco Use  . Smoking status: Never Smoker  . Smokeless tobacco: Never Used  . Tobacco comment: smoking cessation materials not required  Substance Use Topics  . Alcohol use: No  . Drug use: No     Medication list has been reviewed and updated.  Current Meds  Medication Sig  . acetaminophen (TYLENOL) 500 MG tablet Take 500 mg by mouth every 6 (six) hours as needed for pain.  . Calcium-Vitamin D (CALTRATE 600 PLUS-VIT D PO) Take 2 tablets by mouth daily.  Marland Kitchen letrozole (FEMARA) 2.5 MG tablet TAKE ONE TABLET BY MOUTH ONCE DAILY  . lisinopril-hydrochlorothiazide (PRINZIDE,ZESTORETIC) 20-12.5 MG tablet Take 1 tablet by mouth daily.  . mometasone (NASONEX) 50 MCG/ACT nasal spray Place 2 sprays into the nose daily.  . montelukast (SINGULAIR) 10 MG tablet Take 1 tablet (10 mg total) by mouth at bedtime.  . Multiple Vitamins-Minerals (CENTRUM SILVER ULTRA WOMENS PO) Take 1 tablet by mouth daily.  . [DISCONTINUED] cetirizine (ZYRTEC) 10 MG tablet Take 10 mg by mouth as needed.     PHQ 2/9 Scores 07/23/2018 07/30/2017 02/12/2017 11/15/2015  PHQ - 2 Score 0 0 0 0  PHQ- 9 Score 0 - 1 -    Physical Exam Vitals  signs and nursing note reviewed.  Constitutional:      General: She is not in acute distress.    Appearance: She is not diaphoretic.  HENT:     Head: Normocephalic and atraumatic.     Right Ear: Tympanic membrane, ear canal and external ear normal.     Left Ear: Tympanic membrane, ear canal and external ear normal.     Nose: Nose normal.     Mouth/Throat:     Mouth: Mucous membranes are moist.  Eyes:     General:        Right eye: No discharge.        Left eye: No discharge.     Conjunctiva/sclera: Conjunctivae normal.     Pupils: Pupils are equal, round, and reactive to light.  Neck:     Musculoskeletal: Normal range of motion and neck supple.     Thyroid: No thyromegaly.     Vascular: No JVD.  Cardiovascular:     Rate and Rhythm: Normal rate and regular rhythm.     Heart sounds: Normal heart sounds. No murmur. No friction rub. No gallop.   Pulmonary:     Effort: Pulmonary effort is normal. No respiratory distress.     Breath sounds: Normal breath sounds. No stridor. No wheezing, rhonchi or rales.  Chest:     Chest wall: No tenderness.  Abdominal:     General: Abdomen is flat. Bowel sounds are normal.     Palpations: Abdomen is soft. There is no mass.     Tenderness: There is no abdominal tenderness. There is no guarding.  Musculoskeletal:  Normal range of motion.  Lymphadenopathy:     Cervical: No cervical adenopathy.  Skin:    General: Skin is warm and dry.  Neurological:     Mental Status: She is alert.     Deep Tendon Reflexes: Reflexes are normal and symmetric.     BP 120/70   Pulse 80   Ht 5' (1.524 m)   Wt 152 lb (68.9 kg)   BMI 29.69 kg/m   Assessment and Plan:  1. Essential hypertension Chronic.  Controlled.  Continue lisinopril HCTZ 20-12 0.5. - lisinopril-hydrochlorothiazide (PRINZIDE,ZESTORETIC) 20-12.5 MG tablet; Take 1 tablet by mouth daily.  Dispense: 90 tablet; Refill: 1  2. Seasonal allergic rhinitis due to pollen Seasonal allergy she is  episodic in nature Nasonex refilled - mometasone (NASONEX) 50 MCG/ACT nasal spray; Place 2 sprays into the nose daily.  Dispense: 17 g; Refill: 12 - montelukast (SINGULAIR) 10 MG tablet; Take 1 tablet (10 mg total) by mouth at bedtime.  Dispense: 90 tablet; Refill: 1  3. Flu vaccine need Discussed and administered - Flu vaccine HIGH DOSE PF (Fluzone High dose)

## 2018-08-02 ENCOUNTER — Ambulatory Visit (INDEPENDENT_AMBULATORY_CARE_PROVIDER_SITE_OTHER): Payer: Medicare HMO

## 2018-08-02 VITALS — BP 122/76 | HR 97 | Temp 97.4°F | Resp 16 | Ht 60.0 in | Wt 152.0 lb

## 2018-08-02 DIAGNOSIS — Z0000 Encounter for general adult medical examination without abnormal findings: Secondary | ICD-10-CM | POA: Diagnosis not present

## 2018-08-02 DIAGNOSIS — S7010XA Contusion of unspecified thigh, initial encounter: Secondary | ICD-10-CM | POA: Insufficient documentation

## 2018-08-02 NOTE — Patient Instructions (Signed)
Ms. Degraaf , Thank you for taking time to come for your Medicare Wellness Visit. I appreciate your ongoing commitment to your health goals. Please review the following plan we discussed and let me know if I can assist you in the future.   Screening recommendations/referrals: Colonoscopy: no longer required Mammogram: done 05/11/18 repeat every year Bone Density: done 05/11/18 repeat in 2021 Recommended yearly ophthalmology/optometry visit for glaucoma screening and checkup Recommended yearly dental visit for hygiene and checkup  Vaccinations: Influenza vaccine: done 07/23/18 Pneumococcal vaccine: done 02/12/17 Tdap vaccine: done 07/31/14 Shingles vaccine: Shingrix discussed. Please contact your pharmacy for coverage information.     Advanced directives: Advance directive discussed with you today. I have provided a copy for you to complete at home and have notarized. Once this is complete please bring a copy in to our office so we can scan it into your chart.  Conditions/risks identified: Recommend 3-4 servings of fresh fruits and vegetables per day.   Next appointment: Please follow up in one year for your Medicare Annual Wellness visit.     Preventive Care 52 Years and Older, Female Preventive care refers to lifestyle choices and visits with your health care provider that can promote health and wellness. What does preventive care include?  A yearly physical exam. This is also called an annual well check.  Dental exams once or twice a year.  Routine eye exams. Ask your health care provider how often you should have your eyes checked.  Personal lifestyle choices, including:  Daily care of your teeth and gums.  Regular physical activity.  Eating a healthy diet.  Avoiding tobacco and drug use.  Limiting alcohol use.  Practicing safe sex.  Taking low-dose aspirin every day.  Taking vitamin and mineral supplements as recommended by your health care provider. What happens  during an annual well check? The services and screenings done by your health care provider during your annual well check will depend on your age, overall health, lifestyle risk factors, and family history of disease. Counseling  Your health care provider may ask you questions about your:  Alcohol use.  Tobacco use.  Drug use.  Emotional well-being.  Home and relationship well-being.  Sexual activity.  Eating habits.  History of falls.  Memory and ability to understand (cognition).  Work and work Statistician.  Reproductive health. Screening  You may have the following tests or measurements:  Height, weight, and BMI.  Blood pressure.  Lipid and cholesterol levels. These may be checked every 5 years, or more frequently if you are over 39 years old.  Skin check.  Lung cancer screening. You may have this screening every year starting at age 7 if you have a 30-pack-year history of smoking and currently smoke or have quit within the past 15 years.  Fecal occult blood test (FOBT) of the stool. You may have this test every year starting at age 74.  Flexible sigmoidoscopy or colonoscopy. You may have a sigmoidoscopy every 5 years or a colonoscopy every 10 years starting at age 30.  Hepatitis C blood test.  Hepatitis B blood test.  Sexually transmitted disease (STD) testing.  Diabetes screening. This is done by checking your blood sugar (glucose) after you have not eaten for a while (fasting). You may have this done every 1-3 years.  Bone density scan. This is done to screen for osteoporosis. You may have this done starting at age 61.  Mammogram. This may be done every 1-2 years. Talk to your health care  provider about how often you should have regular mammograms. Talk with your health care provider about your test results, treatment options, and if necessary, the need for more tests. Vaccines  Your health care provider may recommend certain vaccines, such  as:  Influenza vaccine. This is recommended every year.  Tetanus, diphtheria, and acellular pertussis (Tdap, Td) vaccine. You may need a Td booster every 10 years.  Zoster vaccine. You may need this after age 45.  Pneumococcal 13-valent conjugate (PCV13) vaccine. One dose is recommended after age 64.  Pneumococcal polysaccharide (PPSV23) vaccine. One dose is recommended after age 24. Talk to your health care provider about which screenings and vaccines you need and how often you need them. This information is not intended to replace advice given to you by your health care provider. Make sure you discuss any questions you have with your health care provider. Document Released: 08/03/2015 Document Revised: 03/26/2016 Document Reviewed: 05/08/2015 Elsevier Interactive Patient Education  2017 [place] Prevention in the Home Falls can cause injuries. They can happen to people of all ages. There are many things you can do to make your home safe and to help prevent falls. What can I do on the outside of my home?  Regularly fix the edges of walkways and driveways and fix any cracks.  Remove anything that might make you trip as you walk through a door, such as a raised step or threshold.  Trim any bushes or trees on the path to your home.  Use bright outdoor lighting.  Clear any walking paths of anything that might make someone trip, such as rocks or tools.  Regularly check to see if handrails are loose or broken. Make sure that both sides of any steps have handrails.  Any raised decks and porches should have guardrails on the edges.  Have any leaves, snow, or ice cleared regularly.  Use sand or salt on walking paths during winter.  Clean up any spills in your garage right away. This includes oil or grease spills. What can I do in the bathroom?  Use night lights.  Install grab bars by the toilet and in the tub and shower. Do not use towel bars as grab bars.  Use  non-skid mats or decals in the tub or shower.  If you need to sit down in the shower, use a plastic, non-slip stool.  Keep the floor dry. Clean up any water that spills on the floor as soon as it happens.  Remove soap buildup in the tub or shower regularly.  Attach bath mats securely with double-sided non-slip rug tape.  Do not have throw rugs and other things on the floor that can make you trip. What can I do in the bedroom?  Use night lights.  Make sure that you have a light by your bed that is easy to reach.  Do not use any sheets or blankets that are too big for your bed. They should not hang down onto the floor.  Have a firm chair that has side arms. You can use this for support while you get dressed.  Do not have throw rugs and other things on the floor that can make you trip. What can I do in the kitchen?  Clean up any spills right away.  Avoid walking on wet floors.  Keep items that you use a lot in easy-to-reach places.  If you need to reach something above you, use a strong step stool that has a grab bar.  Keep electrical cords out of the way.  Do not use floor polish or wax that makes floors slippery. If you must use wax, use non-skid floor wax.  Do not have throw rugs and other things on the floor that can make you trip. What can I do with my stairs?  Do not leave any items on the stairs.  Make sure that there are handrails on both sides of the stairs and use them. Fix handrails that are broken or loose. Make sure that handrails are as long as the stairways.  Check any carpeting to make sure that it is firmly attached to the stairs. Fix any carpet that is loose or worn.  Avoid having throw rugs at the top or bottom of the stairs. If you do have throw rugs, attach them to the floor with carpet tape.  Make sure that you have a light switch at the top of the stairs and the bottom of the stairs. If you do not have them, ask someone to add them for you. What  else can I do to help prevent falls?  Wear shoes that:  Do not have high heels.  Have rubber bottoms.  Are comfortable and fit you well.  Are closed at the toe. Do not wear sandals.  If you use a stepladder:  Make sure that it is fully opened. Do not climb a closed stepladder.  Make sure that both sides of the stepladder are locked into place.  Ask someone to hold it for you, if possible.  Clearly mark and make sure that you can see:  Any grab bars or handrails.  First and last steps.  Where the edge of each step is.  Use tools that help you move around (mobility aids) if they are needed. These include:  Canes.  Walkers.  Scooters.  Crutches.  Turn on the lights when you go into a dark area. Replace any light bulbs as soon as they burn out.  Set up your furniture so you have a clear path. Avoid moving your furniture around.  If any of your floors are uneven, fix them.  If there are any pets around you, be aware of where they are.  Review your medicines with your doctor. Some medicines can make you feel dizzy. This can increase your chance of falling. Ask your doctor what other things that you can do to help prevent falls. This information is not intended to replace advice given to you by your health care provider. Make sure you discuss any questions you have with your health care provider. Document Released: 05/03/2009 Document Revised: 12/13/2015 Document Reviewed: 08/11/2014 Elsevier Interactive Patient Education  2017 [employer].

## 2018-08-02 NOTE — Progress Notes (Signed)
Subjective:   Kristin Miller is a 83 y.o. female who presents for Medicare Annual (Subsequent) preventive examination.  Review of Systems:   Cardiac Risk Factors include: advanced age (>52mn, >>4women);hypertension     Objective:     Vitals: BP 122/76 (BP Location: Left Arm, Patient Position: Sitting, Cuff Size: Normal)   Pulse 97   Temp (!) 97.4 F (36.3 C) (Oral)   Resp 16   Ht 5' (1.524 m)   Wt 152 lb (68.9 kg)   SpO2 97%   BMI 29.69 kg/m   Body mass index is 29.69 kg/m.  Advanced Directives 08/02/2018 11/23/2017 07/30/2017 06/12/2015 05/21/2015  Does Patient Have a Medical Advance Directive? No No No No Yes  Type of Advance Directive - - - - HPress photographerLiving will  Copy of HHawthornein Chart? - - - - No - copy requested  Would patient like information on creating a medical advance directive? Yes (MAU/Ambulatory/Procedural Areas - Information given) No - Patient declined Yes (MAU/Ambulatory/Procedural Areas - Information given) No - patient declined information -    Tobacco Social History   Tobacco Use  Smoking Status Never Smoker  Smokeless Tobacco Never Used  Tobacco Comment   smoking cessation materials not required     Counseling given: Not Answered Comment: smoking cessation materials not required   Clinical Intake:  Pre-visit preparation completed: Yes  Pain : 0-10 Pain Score: 8  Pain Type: Chronic pain Pain Location: Back Pain Orientation: Medial, Lower Pain Descriptors / Indicators: Aching Pain Onset: More than a month ago Pain Frequency: Constant     Nutritional Status: BMI 25 -29 Overweight Nutritional Risks: None Diabetes: No  How often do you need to have someone help you when you read instructions, pamphlets, or other written materials from your doctor or pharmacy?: 1 - Never What is the last grade level you completed in school?: 12th grade  Interpreter Needed?: No  Information entered by ::  KClemetine MarkerLPN  Past Medical History:  Diagnosis Date  . Allergy   . Anemia    H/O AS A CHILD  . Arthritis   . Back pain   . [place]   4 millimeter, T1a, N0; ER 90%, PR 0%, HER-2/neu not overexpressed.  Declined radiation therapy.  . Collar bone fracture 05/28/2013   Fractured Collar bone on left  . Dysrhythmia    h/o heart skipping beat years ago  . Frequent headaches    h/o migraines  . Hypertension   . Osteopenia 04/2018   Bone density without significant interval change since 2017.   Past Surgical History:  Procedure Laterality Date  . ABDOMINAL HYSTERECTOMY  1970   Partial  . BREAST BIOPSY Right 05/28/2015   INVASIVE MAMMARY CARCINOMA   . BREAST BIOPSY Right 06/12/2015   Wide excision, sentinel lymph node biopsy.  .Marland KitchenBREAST LUMPECTOMY WITH AXILLARY LYMPH NODE DISSECTION Right 06/12/2015   4 mm, T1a,N0 withDCIS with clear margins. ER: 90%, PR 0%; Her 2 neu not overexpressed.   .Marland KitchenCATARACT EXTRACTION W/PHACO Left 02/03/2017   Procedure: CATARACT EXTRACTION PHACO AND INTRAOCULAR LENS PLACEMENT (IOC);  Surgeon: PBirder Robson MD;  Location: [facility] ORS;  Service: Ophthalmology;  Laterality: Left;  [place] 00:28 AP% 16.3 CDE 4.68 Fluid pack lot # [id number]  . COLONOSCOPY     Family History  Problem Relation Age of Onset  . Mental illness Mother  Dementia  . Stroke Father   . Cancer Brother        Lung Cancer  . Breast cancer Other    Social History   Socioeconomic History  . Marital status: Married    Spouse name: Not on file  . Number of children: 3  . Years of education: GED  . Highest education level: 12th grade  Occupational History  . Occupation: Retired  Scientific laboratory technician  . Financial resource strain: Not hard at all  . Food insecurity:    Worry: Never true    Inability: Never true  . Transportation needs:    Medical: No    Non-medical: No  Tobacco Use  . Smoking status: Never Smoker   . Smokeless tobacco: Never Used  . Tobacco comment: smoking cessation materials not required  Substance and Sexual Activity  . Alcohol use: No  . Drug use: No  . Sexual activity: Not Currently  Lifestyle  . Physical activity:    Days per week: 0 days    Minutes per session: 0 min  . Stress: Not at all  Relationships  . Social connections:    Talks on phone: More than three times a week    Gets together: More than three times a week    Attends religious service: More than 4 times per year    Active member of club or organization: Patient refused    Attends meetings of clubs or organizations: More than 4 times per year    Relationship status: Married  Other Topics Concern  . Not on file  Social History Narrative  . Not on file    Outpatient Encounter Medications as of 08/02/2018  Medication Sig  . acetaminophen (TYLENOL) 500 MG tablet Take 500 mg by mouth every 6 (six) hours as needed for pain.  . Calcium-Vitamin D (CALTRATE 600 PLUS-VIT D PO) Take 2 tablets by mouth daily.  Marland Kitchen letrozole (FEMARA) 2.5 MG tablet TAKE ONE TABLET BY MOUTH ONCE DAILY  . lisinopril-hydrochlorothiazide (PRINZIDE,ZESTORETIC) 20-12.5 MG tablet Take 1 tablet by mouth daily.  . mometasone (NASONEX) 50 MCG/ACT nasal spray Place 2 sprays into the nose daily.  . montelukast (SINGULAIR) 10 MG tablet Take 1 tablet (10 mg total) by mouth at bedtime.  . Multiple Vitamins-Minerals (CENTRUM SILVER ULTRA WOMENS PO) Take 1 tablet by mouth daily.   No facility-administered encounter medications on file as of 08/02/2018.     Activities of Daily Living In your present state of health, do you have any difficulty performing the following activities: 08/02/2018  Hearing? N  Comment declines hearing aids  Vision? N  Comment wears glasses  Difficulty concentrating or making decisions? N  Walking or climbing stairs? N  Dressing or bathing? N  Doing errands, shopping? N  Preparing Food and eating ? N  Using the Toilet?  N  In the past six months, have you accidently leaked urine? N  Do you have problems with loss of bowel control? N  Managing your Medications? N  Managing your Finances? N  Housekeeping or managing your Housekeeping? N  Some recent data might be hidden    Patient Care Team: Juline Patch, MD as PCP - General (Family Medicine) Bary Castilla, Forest Gleason, MD (General Surgery) Birder Robson, MD as Consulting Physician (Ophthalmology)    Assessment:   This is a routine wellness examination for Leandra.  Exercise Activities and Dietary recommendations Current Exercise Habits: The patient does not participate in regular exercise at present, Exercise limited by: orthopedic condition(s)(back  pain)  Goals    . DIET - EAT MORE FRUITS AND VEGETABLES     Recommend eating 3-4 servings of fresh fruits and vegetables per day    . DIET - INCREASE WATER INTAKE     Recommend to drink at least 6-8 8oz glasses of water per day       Fall Risk Fall Risk  08/02/2018 07/23/2018 07/30/2017 02/12/2017 11/15/2015  Falls in the past year? 1 1 No No No  Number falls in past yr: 1 0 - - -  Injury with Fall? 0 1 - - -  Follow up Falls evaluation completed;Falls prevention discussed Falls evaluation completed - - -   FALL RISK PREVENTION PERTAINING TO THE HOME:  Any stairs in or around the home WITH handrails? Yes  Home free of loose throw rugs in walkways, pet beds, electrical cords, etc? Yes  Adequate lighting in your home to reduce risk of falls? Yes   ASSISTIVE DEVICES UTILIZED TO PREVENT FALLS:  Life alert? No  Use of a cane, walker or w/c? No  Grab bars in the bathroom? No  Shower chair or bench in shower? Yes Elevated toilet seat or a handicapped toilet? No   DME ORDERS:  DME order needed?  No   TIMED UP AND GO:  Was the test performed? Yes .  Length of time to ambulate 10 feet: 6 sec.   GAIT:  Appearance of gait: Gait stead-fast and without the use of an assistive device.  Education:  Fall risk prevention has been discussed.  Intervention(s) required? No   Depression Screen PHQ 2/9 Scores 08/02/2018 07/23/2018 07/30/2017 02/12/2017  PHQ - 2 Score 0 0 0 0  PHQ- 9 Score - 0 - 1     Cognitive Function     6CIT Screen 07/30/2017  What Year? 0 points  What month? 0 points  What time? 0 points  Count back from 20 0 points  Months in reverse 0 points  Repeat phrase 0 points  Total Score 0    Immunization History  Administered Date(s) Administered  . Influenza Split 03/27/2014  . Influenza, High Dose Seasonal PF 07/30/2017, 07/23/2018  . Influenza,inj,Quad PF,6+ Mos 04/05/2014  . Pneumococcal Conjugate-13 10/04/2013  . Pneumococcal Polysaccharide-23 02/12/2017  . Tdap 07/21/2008    Qualifies for Shingles Vaccine? Yes  Due for Shingrix. Education has been provided regarding the importance of this vaccine. Pt has been advised to call insurance company to determine out of pocket expense. Advised may also receive vaccine at local pharmacy or Health Dept. Verbalized acceptance and understanding.  Tdap: Up to date   Flu Vaccine: Up to date  Pneumococcal Vaccine: Up to date    Screening Tests Health Maintenance  Topic Date Due  . MAMMOGRAM  05/12/2019  . TETANUS/TDAP  07/31/2024  . INFLUENZA VACCINE  Completed  . DEXA SCAN  Completed  . PNA vac Low Risk Adult  Completed    Cancer Screenings:  Colorectal Screening: No longer required.   Mammogram: Completed 05/11/18. Repeat every year; Ordered by Dr. Bary Castilla  Bone Density: Completed 05/11/18. Results reflect OSTEOPENIA,  Repeat every 2 years.   Lung Cancer Screening: (Low Dose CT Chest recommended if Age 63-80 years, 30 pack-year currently smoking OR have quit w/in 15years.) does not qualify.     Additional Screening:  Hepatitis C Screening: No longer required  Vision Screening: Recommended annual ophthalmology exams for early detection of glaucoma and other disorders of the eye. Is the patient up to  date with their annual eye exam?  Yes  Who is the provider or what is the name of the office in which the pt attends annual eye exams? Dr. Michelene Heady [facility]   Dental Screening: Recommended annual dental exams for proper oral hygiene  Community Resource Referral:  CRR required this visit?  No      Plan:    I have personally reviewed and addressed the Medicare Annual Wellness questionnaire and have noted the following in the patient's chart:  A. Medical and social history B. Use of alcohol, tobacco or illicit drugs  C. Current medications and supplements D. Functional ability and status E.  Nutritional status F.  Physical activity G. Advance directives H. List of other physicians I.  Hospitalizations, surgeries, and ER visits in previous 12 months J.  Arnold such as hearing and vision if needed, cognitive and depression L. Referrals and appointments   In addition, I have reviewed and discussed with patient certain preventive protocols, quality metrics, and best practice recommendations. A written personalized care plan for preventive services as well as general preventive health recommendations were provided to patient.   Signed,  Clemetine Marker, LPN Nurse Health Advisor   Nurse Notes: None. Pt doing well and appreciative of visit today.

## 2018-08-15 ENCOUNTER — Other Ambulatory Visit: Payer: Self-pay | Admitting: General Surgery

## 2018-08-15 DIAGNOSIS — C50511 Malignant neoplasm of lower-outer quadrant of right female breast: Secondary | ICD-10-CM

## 2018-12-07 DIAGNOSIS — R69 Illness, unspecified: Secondary | ICD-10-CM | POA: Diagnosis not present

## 2019-01-31 DIAGNOSIS — H40003 Preglaucoma, unspecified, bilateral: Secondary | ICD-10-CM | POA: Diagnosis not present

## 2019-01-31 DIAGNOSIS — H2511 Age-related nuclear cataract, right eye: Secondary | ICD-10-CM | POA: Diagnosis not present

## 2019-02-04 ENCOUNTER — Ambulatory Visit (INDEPENDENT_AMBULATORY_CARE_PROVIDER_SITE_OTHER): Payer: Medicare HMO | Admitting: Family Medicine

## 2019-02-04 ENCOUNTER — Encounter: Payer: Self-pay | Admitting: Family Medicine

## 2019-02-04 ENCOUNTER — Other Ambulatory Visit: Payer: Self-pay

## 2019-02-04 ENCOUNTER — Ambulatory Visit: Payer: Medicare HMO | Admitting: Family Medicine

## 2019-02-04 VITALS — BP 138/80 | HR 72 | Ht 60.0 in | Wt 150.0 lb

## 2019-02-04 DIAGNOSIS — I10 Essential (primary) hypertension: Secondary | ICD-10-CM

## 2019-02-04 DIAGNOSIS — E7800 Pure hypercholesterolemia, unspecified: Secondary | ICD-10-CM

## 2019-02-04 DIAGNOSIS — J301 Allergic rhinitis due to pollen: Secondary | ICD-10-CM | POA: Diagnosis not present

## 2019-02-04 MED ORDER — LISINOPRIL-HYDROCHLOROTHIAZIDE 20-12.5 MG PO TABS: 1.0000 | ORAL_TABLET | Freq: Every day | ORAL | 1 refills | Status: DC

## 2019-02-04 MED ORDER — MOMETASONE FUROATE 50 MCG/ACT NA SUSP: 2.0000 | Freq: Every day | NASAL | 12 refills | Status: DC

## 2019-02-04 MED ORDER — MONTELUKAST SODIUM 10 MG PO TABS: 10.0000 mg | ORAL_TABLET | Freq: Every day | ORAL | 3 refills | Status: DC

## 2019-02-04 NOTE — Progress Notes (Signed)
Date:  02/04/2019   Name:  Kristin Miller   DOB:  1936/04/08   MRN:  [id number]   Chief Complaint: Hypertension and Allergic Rhinitis   Hypertension This is a chronic problem. The current episode started more than 1 year ago. The problem is unchanged. The problem is controlled. Pertinent negatives include no anxiety, blurred vision, chest pain, headaches, malaise/fatigue, neck pain, orthopnea, palpitations, peripheral edema, PND or shortness of breath. There are no associated agents to hypertension. There are no known risk factors for coronary artery disease. Past treatments include ACE inhibitors and diuretics. The current treatment provides moderate improvement. There is no history of angina, kidney disease, CAD/MI, CVA, heart failure, left ventricular hypertrophy, PVD or retinopathy. There is no history of chronic renal disease, a hypertension causing med or renovascular disease.  URI  This is a recurrent (for allergic rhinitis) problem. The current episode started more than 1 year ago. The problem has been waxing and waning. There has been no fever. Pertinent negatives include no chest pain, headaches or neck pain.    Review of Systems  Constitutional: Negative for malaise/fatigue.  Eyes: Negative for blurred vision.  Respiratory: Negative for shortness of breath.   Cardiovascular: Negative for chest pain, palpitations, orthopnea and PND.  Musculoskeletal: Negative for neck pain.  Neurological: Negative for headaches.    Patient Active Problem List   Diagnosis Date Noted  . Contusion of thigh 08/02/2018  . BP (high blood pressure) 11/15/2015  . OP (osteoporosis) 11/15/2015  . Primary cancer of lower-outer quadrant of right breast ([place]) 06/20/2015  . Medicare annual wellness visit, subsequent 10/09/2014  . Low back pain 08/02/2014  . Establishing care with new doctor, encounter for 08/02/2014  . HTN (hypertension) 08/02/2014  . Basal cell carcinoma in situ of skin of left  shoulder 07/31/2014  . Microcalcifications of the breast 11/28/2013  . Abnormal finding on mammography 10/03/2013  . Skin cancer 10/03/2013  . Mammographic microcalcification 05/04/2013  . Encounter for gynecological examination without abnormal finding 04/06/2013  . Chronic bilateral low back pain without sciatica 03/29/2012    Allergies  Allergen Reactions  . Other Rash    SUTURES-(PARTIAL HYSTERECTOMY)    Past Surgical History:  Procedure Laterality Date  . ABDOMINAL HYSTERECTOMY  1970   Partial  . BREAST BIOPSY Right 05/28/2015   INVASIVE MAMMARY CARCINOMA   . BREAST BIOPSY Right 06/12/2015   Wide excision, sentinel lymph node biopsy.  Marland Kitchen BREAST LUMPECTOMY WITH AXILLARY LYMPH NODE DISSECTION Right 06/12/2015   4 mm, T1a,N0 withDCIS with clear margins. ER: 90%, PR 0%; Her 2 neu not overexpressed.   Marland Kitchen CATARACT EXTRACTION W/PHACO Left 02/03/2017   Procedure: CATARACT EXTRACTION PHACO AND INTRAOCULAR LENS PLACEMENT (IOC);  Surgeon: Birder Robson, MD;  Location: [facility] ORS;  Service: Ophthalmology;  Laterality: Left;  [place]  00:28 AP% 16.3 CDE 4.68 Fluid pack lot # [id number]  . COLONOSCOPY      Social History   Tobacco Use  . Smoking status: Never Smoker  . Smokeless tobacco: Never Used  . Tobacco comment: smoking cessation materials not required  Substance Use Topics  . Alcohol use: No  . Drug use: No     Medication list has been reviewed and updated.  Current Meds  Medication Sig  . acetaminophen (TYLENOL) 500 MG tablet Take 500 mg by mouth every 6 (six) hours as needed for pain.  . Calcium-Vitamin D (CALTRATE 600 PLUS-VIT D PO) Take 2 tablets by mouth daily.  Marland Kitchen lisinopril-hydrochlorothiazide (PRINZIDE,ZESTORETIC) 20-12.5  MG tablet Take 1 tablet by mouth daily.  . Multiple Vitamins-Minerals (CENTRUM SILVER ULTRA WOMENS PO) Take 1 tablet by mouth daily.    PHQ 2/9 Scores 02/04/2019 08/02/2018 07/23/2018 07/30/2017  PHQ - 2 Score 0 0 0 0  PHQ- 9 Score 0 - 0 -    BP  Readings from Last 3 Encounters:  02/04/19 138/80  08/02/18 122/76  07/23/18 120/70    Physical Exam  Wt Readings from Last 3 Encounters:  02/04/19 150 lb (68 kg)  08/02/18 152 lb (68.9 kg)  07/23/18 152 lb (68.9 kg)    BP 138/80   Pulse 72   Ht 5' (1.524 m)   Wt 150 lb (68 kg)   BMI 29.29 kg/m   Assessment and Plan: 1. Essential hypertension Chronic.  Controlled.  Continue lisinopril hydrochlorothiazide 12-12 0.5.  Will check renal function panel. - lisinopril-hydrochlorothiazide (ZESTORETIC) 20-12.5 MG tablet; Take 1 tablet by mouth daily.  Dispense: 90 tablet; Refill: 1 - Renal Function Panel  2. Pure hypercholesterolemia Chronic.  Controlled.  Will check lipid panel and adjust accordingly. - Lipid Panel With LDL/HDL Ratio  3. Seasonal allergic rhinitis due to pollen Chronic.  Controlled.  Will refill Nasonex nasal spray and Singulair 10 mg once a day. - mometasone (NASONEX) 50 MCG/ACT nasal spray; Place 2 sprays into the nose daily.  Dispense: 17 g; Refill: 12 - montelukast (SINGULAIR) 10 MG tablet; Take 1 tablet (10 mg total) by mouth at bedtime.  Dispense: 90 tablet; Refill: 3  4. Seasonal allergic rhinitis due to pollen In duplicate - mometasone (NASONEX) 50 MCG/ACT nasal spray; Place 2 sprays into the nose daily.  Dispense: 17 g; Refill: 12 - montelukast (SINGULAIR) 10 MG tablet; Take 1 tablet (10 mg total) by mouth at bedtime.  Dispense: 90 tablet; Refill: 3

## 2019-02-05 LAB — LIPID PANEL WITH LDL/HDL RATIO
Cholesterol, Total: 177 mg/dL (ref 100–199)
HDL: 47 mg/dL (ref >39)
LDL Calculated: 107 mg/dL — ABNORMAL HIGH (ref 0–99)
LDl/HDL Ratio: 2.3 ratio (ref 0.0–3.2)
Triglycerides: 117 mg/dL (ref 0–149)
VLDL Cholesterol Cal: 23 mg/dL (ref 5–40)

## 2019-02-05 LAB — RENAL FUNCTION PANEL
Albumin: 4 g/dL (ref 3.7–4.7)
BUN/Creatinine Ratio: 22 (ref 12–28)
BUN: 18 mg/dL (ref 8–27)
CO2: 21 mmol/L (ref 20–29)
Calcium: 9.3 mg/dL (ref 8.7–10.3)
Chloride: 104 mmol/L (ref 96–106)
Creatinine, Ser: 0.83 mg/dL (ref 0.57–1.00)
GFR calc Af Amer: 77 mL/min/{1.73_m2} (ref >59)
GFR calc non Af Amer: 67 mL/min/{1.73_m2} (ref >59)
Glucose: 98 mg/dL (ref 65–99)
Phosphorus: 3.4 mg/dL (ref 3.0–4.3)
Potassium: 4.2 mmol/L (ref 3.5–5.2)
Sodium: 142 mmol/L (ref 134–144)

## 2019-02-08 ENCOUNTER — Encounter: Payer: Self-pay | Admitting: General Surgery

## 2019-02-11 DIAGNOSIS — I10 Essential (primary) hypertension: Secondary | ICD-10-CM | POA: Diagnosis not present

## 2019-02-11 DIAGNOSIS — H2511 Age-related nuclear cataract, right eye: Secondary | ICD-10-CM | POA: Diagnosis not present

## 2019-02-23 NOTE — Discharge Instructions (Signed)

## 2019-02-24 ENCOUNTER — Other Ambulatory Visit
Admission: RE | Admit: 2019-02-24 | Discharge: 2019-02-24 | Disposition: A | Discharge status: Home / Self Care | Payer: Medicare HMO | Source: Ambulatory Visit | Attending: Ophthalmology | Admitting: Ophthalmology

## 2019-02-24 ENCOUNTER — Other Ambulatory Visit: Payer: Self-pay

## 2019-02-24 DIAGNOSIS — Z01812 Encounter for preprocedural laboratory examination: Secondary | ICD-10-CM | POA: Insufficient documentation

## 2019-02-24 DIAGNOSIS — Z20828 Contact with and (suspected) exposure to other viral communicable diseases: Secondary | ICD-10-CM | POA: Insufficient documentation

## 2019-02-24 LAB — SARS CORONAVIRUS 2 (TAT 6-24 HRS): SARS Coronavirus 2: NEGATIVE

## 2019-02-25 ENCOUNTER — Other Ambulatory Visit: Payer: Medicare HMO

## 2019-03-01 ENCOUNTER — Ambulatory Visit: Payer: Medicare HMO | Admitting: Anesthesiology

## 2019-03-01 ENCOUNTER — Encounter
Admission: RE | Disposition: A | Discharge status: Home / Self Care | Payer: Self-pay | Source: Home / Self Care | Attending: Ophthalmology

## 2019-03-01 ENCOUNTER — Other Ambulatory Visit: Payer: Self-pay

## 2019-03-01 ENCOUNTER — Ambulatory Visit
Admission: RE | Admit: 2019-03-01 | Discharge: 2019-03-01 | Disposition: A | Discharge status: Home / Self Care | Payer: Medicare HMO | Attending: Ophthalmology | Admitting: Ophthalmology

## 2019-03-01 DIAGNOSIS — H2511 Age-related nuclear cataract, right eye: Secondary | ICD-10-CM | POA: Diagnosis not present

## 2019-03-01 DIAGNOSIS — Z853 Personal history of malignant neoplasm of breast: Secondary | ICD-10-CM | POA: Insufficient documentation

## 2019-03-01 DIAGNOSIS — Z79899 Other long term (current) drug therapy: Secondary | ICD-10-CM | POA: Insufficient documentation

## 2019-03-01 DIAGNOSIS — M1990 Unspecified osteoarthritis, unspecified site: Secondary | ICD-10-CM | POA: Diagnosis not present

## 2019-03-01 DIAGNOSIS — M810 Age-related osteoporosis without current pathological fracture: Secondary | ICD-10-CM | POA: Diagnosis not present

## 2019-03-01 DIAGNOSIS — Z79811 Long term (current) use of aromatase inhibitors: Secondary | ICD-10-CM | POA: Insufficient documentation

## 2019-03-01 DIAGNOSIS — H25811 Combined forms of age-related cataract, right eye: Secondary | ICD-10-CM | POA: Diagnosis not present

## 2019-03-01 DIAGNOSIS — I10 Essential (primary) hypertension: Secondary | ICD-10-CM | POA: Insufficient documentation

## 2019-03-01 HISTORY — PX: CATARACT EXTRACTION W/PHACO: SHX586

## 2019-03-01 SURGERY — PHACOEMULSIFICATION, CATARACT, WITH IOL INSERTION
Anesthesia: Monitor Anesthesia Care | Site: Eye | Laterality: Right

## 2019-03-01 MED ORDER — MOXIFLOXACIN HCL 0.5 % OP SOLN
OPHTHALMIC | Status: DC | PRN
  Administered 2019-03-01: 0.2 mL via OPHTHALMIC

## 2019-03-01 MED ORDER — LACTATED RINGERS IV SOLN: INTRAVENOUS | Status: DC

## 2019-03-01 MED ORDER — MIDAZOLAM HCL 2 MG/2ML IJ SOLN
INTRAMUSCULAR | Status: DC | PRN
  Administered 2019-03-01: 1 mg via INTRAVENOUS

## 2019-03-01 MED ORDER — LIDOCAINE HCL (PF) 2 % IJ SOLN
INTRAOCULAR | Status: DC | PRN
  Administered 2019-03-01: 1 mL

## 2019-03-01 MED ORDER — NA CHONDROIT SULF-NA HYALURON 40-17 MG/ML IO SOLN
INTRAOCULAR | Status: DC | PRN
  Administered 2019-03-01: 1 mL via INTRAOCULAR

## 2019-03-01 MED ORDER — TETRACAINE HCL 0.5 % OP SOLN
1.0000 [drp] | OPHTHALMIC | Status: DC | PRN
  Administered 2019-03-01 (×3): 1 [drp] via OPHTHALMIC

## 2019-03-01 MED ORDER — ARMC OPHTHALMIC DILATING DROPS
1.0000 "application " | OPHTHALMIC | Status: DC | PRN
  Administered 2019-03-01 (×3): 1 via OPHTHALMIC

## 2019-03-01 MED ORDER — BRIMONIDINE TARTRATE-TIMOLOL 0.2-0.5 % OP SOLN
OPHTHALMIC | Status: DC | PRN
  Administered 2019-03-01: 1 [drp] via OPHTHALMIC

## 2019-03-01 MED ORDER — FENTANYL CITRATE (PF) 100 MCG/2ML IJ SOLN
INTRAMUSCULAR | Status: DC | PRN
  Administered 2019-03-01: 50 ug via INTRAVENOUS

## 2019-03-01 MED ORDER — EPINEPHRINE PF 1 MG/ML IJ SOLN
INTRAOCULAR | Status: DC | PRN
  Administered 2019-03-01: 11:00:00 94 mL via OPHTHALMIC

## 2019-03-01 SURGICAL SUPPLY — 18 items
CANNULA ANT/CHMB 27GA (MISCELLANEOUS) ×2 IMPLANT
GLOVE SURG LX 8.0 MICRO (GLOVE) ×1
GLOVE SURG LX STRL 8.0 MICRO (GLOVE) ×1 IMPLANT
GLOVE SURG TRIUMPH 8.0 PF LTX (GLOVE) ×2 IMPLANT
GOWN STRL REUS W/ TWL LRG LVL3 (GOWN DISPOSABLE) ×2 IMPLANT
GOWN STRL REUS W/TWL LRG LVL3 (GOWN DISPOSABLE) ×2
LENS IOL TECNIS ITEC 21.0 (Intraocular Lens) ×2 IMPLANT
MARKER SKIN DUAL TIP RULER LAB (MISCELLANEOUS) ×2 IMPLANT
NDL RETROBULBAR .5 NSTRL (NEEDLE) ×2 IMPLANT
NEEDLE FILTER BLUNT 18X 1/2SAF (NEEDLE) ×1
NEEDLE FILTER BLUNT 18X1 1/2 (NEEDLE) ×1 IMPLANT
PACK EYE AFTER SURG (MISCELLANEOUS) ×2 IMPLANT
PACK OPTHALMIC (MISCELLANEOUS) ×2 IMPLANT
PACK PORFILIO (MISCELLANEOUS) ×2 IMPLANT
SYR 3ML LL SCALE MARK (SYRINGE) ×2 IMPLANT
SYR TB 1ML LUER SLIP (SYRINGE) ×2 IMPLANT
WATER STERILE IRR 250ML POUR (IV SOLUTION) ×2 IMPLANT
WIPE NON LINTING 3.25X3.25 (MISCELLANEOUS) ×2 IMPLANT

## 2019-03-01 NOTE — Anesthesia Postprocedure Evaluation (Signed)
Anesthesia Post Note  Patient: Kristin Miller  Procedure(s) Performed: CATARACT EXTRACTION PHACO AND INTRAOCULAR LENS PLACEMENT (IOC)  RIGHT (Right Eye)  Patient location during evaluation: PACU Anesthesia Type: MAC Level of consciousness: awake and alert Pain management: pain level controlled Vital Signs Assessment: post-procedure vital signs reviewed and stable Respiratory status: spontaneous breathing, nonlabored ventilation, respiratory function stable and patient connected to nasal cannula oxygen Cardiovascular status: stable and blood pressure returned to baseline Postop Assessment: no apparent nausea or vomiting Anesthetic complications: no    Veda Canning

## 2019-03-01 NOTE — Transfer of Care (Signed)
Immediate Anesthesia Transfer of Care Note  Patient: Kristin Miller  Procedure(s) Performed: CATARACT EXTRACTION PHACO AND INTRAOCULAR LENS PLACEMENT (IOC)  RIGHT (Right Eye)  Patient Location: PACU  Anesthesia Type: MAC  Level of Consciousness: awake, alert  and patient cooperative  Airway and Oxygen Therapy: Patient Spontanous Breathing and Patient connected to supplemental oxygen  Post-op Assessment: Post-op Vital signs reviewed, Patient's Cardiovascular Status Stable, Respiratory Function Stable, Patent Airway and No signs of Nausea or vomiting  Post-op Vital Signs: Reviewed and stable  Complications: No apparent anesthesia complications

## 2019-03-01 NOTE — H&P (Signed)
All labs reviewed. Abnormal studies sent to patients PCP when indicated.  Previous H&P reviewed, patient examined, there are NO CHANGES.  Kristin Miller Porfilio8/11/202010:40 AM

## 2019-03-01 NOTE — Anesthesia Preprocedure Evaluation (Signed)
Anesthesia Evaluation  Patient identified by MRN, date of birth, ID band Patient awake    Reviewed: Allergy & Precautions, H&P , NPO status , Patient's Chart, lab work & pertinent test results  Airway Mallampati: II  TM Distance: >3 FB     Dental  (+) Missing, Edentulous Upper   Pulmonary neg pulmonary ROS,    breath sounds clear to auscultation       Cardiovascular Exercise Tolerance: Good hypertension, + dysrhythmias (-) Valvular Problems/Murmurs Rhythm:Regular Rate:Normal     Neuro/Psych  Headaches,    GI/Hepatic negative GI ROS, Neg liver ROS,   Endo/Other    Renal/GU      Musculoskeletal  (+) Arthritis , osteoporosis   Abdominal   Peds  Hematology   Anesthesia Other Findings Hx breast cancer  Reproductive/Obstetrics                             Anesthesia Physical  Anesthesia Plan  ASA: II  Anesthesia Plan: MAC   Post-op Pain Management:    Induction:   PONV Risk Score and Plan: TIVA  Airway Management Planned: Natural Airway and Nasal Cannula  Additional Equipment:   Intra-op Plan:   Post-operative Plan:   Informed Consent: I have reviewed the patients History and Physical, chart, labs and discussed the procedure including the risks, benefits and alternatives for the proposed anesthesia with the patient or authorized representative who has indicated his/her understanding and acceptance.       Plan Discussed with: CRNA  Anesthesia Plan Comments:         Anesthesia Quick Evaluation

## 2019-03-01 NOTE — Op Note (Signed)
PREOPERATIVE DIAGNOSIS:  Nuclear sclerotic cataract of the right eye.   POSTOPERATIVE DIAGNOSIS:  H25.11  CATARACT   OPERATIVE PROCEDURE: Procedure(s): CATARACT EXTRACTION PHACO AND INTRAOCULAR LENS PLACEMENT (IOC)  RIGHT   SURGEON:  Birder Robson, MD.   ANESTHESIA:  Anesthesiologist: Veda Canning, MD CRNA: Cameron Ali, CRNA  1.      Managed anesthesia care. 2.      0.7ml of Shugarcaine was instilled in the eye following the paracentesis.   COMPLICATIONS:  None.   TECHNIQUE:   Stop and chop   DESCRIPTION OF PROCEDURE:  The patient was examined and consented in the preoperative holding area where the aforementioned topical anesthesia was applied to the right eye and then brought back to the Operating Room where the right eye was prepped and draped in the usual sterile ophthalmic fashion and a lid speculum was placed. A paracentesis was created with the side port blade and the anterior chamber was filled with viscoelastic. A near clear corneal incision was performed with the steel keratome. A continuous curvilinear capsulorrhexis was performed with a cystotome followed by the capsulorrhexis forceps. Hydrodissection and hydrodelineation were carried out with BSS on a blunt cannula. The lens was removed in a stop and chop  technique and the remaining cortical material was removed with the irrigation-aspiration handpiece. The capsular bag was inflated with viscoelastic and the Technis [id number]  lens was placed in the capsular bag without complication. The remaining viscoelastic was removed from the eye with the irrigation-aspiration handpiece. The wounds were hydrated. The anterior chamber was flushed with BSS and the eye was inflated to physiologic pressure. 0.70ml of Vigamox was placed in the anterior chamber. The wounds were found to be water tight. The eye was dressed with Combigan. The patient was given protective glasses to wear throughout the day and a shield with which to sleep tonight. The  patient was also given drops with which to begin a drop regimen today and will follow-up with me in one day. Implant Name Type Inv. Item Serial No. Manufacturer Lot No. LRB No. Used Action  LENS IOL DIOP 21.0 - [id number] Intraocular Lens LENS IOL DIOP 21.0 [id number]  Right 1 Implanted   Procedure(s): CATARACT EXTRACTION PHACO AND INTRAOCULAR LENS PLACEMENT (IOC)  RIGHT (Right)  Electronically signed: Birder Robson 03/01/2019 11:06 AM

## 2019-03-01 NOTE — Anesthesia Procedure Notes (Signed)
Procedure Name: MAC Date/Time: 03/01/2019 10:47 AM Performed by: Cameron Ali, CRNA Pre-anesthesia Checklist: Patient identified, Emergency Drugs available, Suction available, Timeout performed and Patient being monitored Patient Re-evaluated:Patient Re-evaluated prior to induction Oxygen Delivery Method: Nasal cannula Placement Confirmation: positive ETCO2

## 2019-03-02 ENCOUNTER — Encounter: Payer: Self-pay | Admitting: Ophthalmology

## 2019-03-15 ENCOUNTER — Other Ambulatory Visit: Payer: Self-pay

## 2019-03-15 DIAGNOSIS — C50511 Malignant neoplasm of lower-outer quadrant of right female breast: Secondary | ICD-10-CM

## 2019-05-13 ENCOUNTER — Ambulatory Visit
Admission: RE | Admit: 2019-05-13 | Discharge: 2019-05-13 | Disposition: A | Discharge status: Home / Self Care | Payer: Medicare HMO | Source: Ambulatory Visit | Attending: Surgery | Admitting: Surgery

## 2019-05-13 DIAGNOSIS — Z170 Estrogen receptor positive status [ER+]: Secondary | ICD-10-CM | POA: Diagnosis not present

## 2019-05-13 DIAGNOSIS — C50511 Malignant neoplasm of lower-outer quadrant of right female breast: Secondary | ICD-10-CM

## 2019-05-13 DIAGNOSIS — Z853 Personal history of malignant neoplasm of breast: Secondary | ICD-10-CM | POA: Diagnosis not present

## 2019-05-13 DIAGNOSIS — R928 Other abnormal and inconclusive findings on diagnostic imaging of breast: Secondary | ICD-10-CM | POA: Diagnosis not present

## 2019-05-16 ENCOUNTER — Telehealth: Payer: Self-pay

## 2019-05-16 NOTE — Telephone Encounter (Signed)
-----   Message from Jules Husbands, MD sent at 05/16/2019 10:24 AM EDT ----- Please let her know mammogram  was good ----- Message ----- From: Interface, Rad Results In Sent: 05/13/2019   1:39 PM EDT To: Jules Husbands, MD

## 2019-05-16 NOTE — Telephone Encounter (Signed)
Spoke with patient to notify her of "normal mammogram results" per Dr.Pabon. Patient verbalizes understanding.

## 2019-05-18 ENCOUNTER — Encounter: Payer: Self-pay | Admitting: Surgery

## 2019-05-18 ENCOUNTER — Ambulatory Visit: Payer: Medicare HMO | Admitting: Surgery

## 2019-05-18 ENCOUNTER — Other Ambulatory Visit: Payer: Self-pay

## 2019-05-18 VITALS — BP 140/81 | HR 89 | Temp 94.6°F | Ht 60.0 in | Wt 151.2 lb

## 2019-05-18 DIAGNOSIS — Z170 Estrogen receptor positive status [ER+]: Secondary | ICD-10-CM

## 2019-05-18 DIAGNOSIS — C50511 Malignant neoplasm of lower-outer quadrant of right female breast: Secondary | ICD-10-CM

## 2019-05-18 MED ORDER — LETROZOLE 2.5 MG PO TABS: 2.5000 mg | ORAL_TABLET | Freq: Every day | ORAL | 3 refills | Status: DC

## 2019-05-18 NOTE — Patient Instructions (Signed)

## 2019-05-24 NOTE — Progress Notes (Signed)
Outpatient Surgical Follow Up  05/24/2019  Kristin Miller is an 83 y.o. female.   Chief Complaint  Patient presents with  . Follow-up    mammo f/u 05/13/19    HPI: 83 year old female with a history of right breast cancer status post lumpectomy and radiation therapy.  She is doing well.  No new masses lumps or lymphadenopathy.  No fevers no chills no weight loss.  Mammogram personally reviewed showing no evidence of any concerning lesions.  He has tolerated letrozole very well  And wishes to continue.  Past Medical History:  Diagnosis Date  . Allergy   . Anemia    H/O AS A CHILD  . Arthritis   . Back pain   . Breast cancer of upper-outer quadrant of right female breast ([place]) 06/12/2015   4 millimeter, T1a, N0; ER 90%, PR 0%, HER-2/neu not overexpressed.  Declined radiation therapy.  . Collar bone fracture 05/28/2013   Fractured Collar bone on left  . Dysrhythmia    h/o heart skipping beat years ago  . Frequent headaches    h/o migraines  . Hypertension   . Osteopenia 04/2018   Bone density without significant interval change since 2017.    Past Surgical History:  Procedure Laterality Date  . ABDOMINAL HYSTERECTOMY  1970   Partial  . BREAST BIOPSY Right 05/28/2015   INVASIVE MAMMARY CARCINOMA   . BREAST BIOPSY Right 06/12/2015   Wide excision, sentinel lymph node biopsy.  Marland Kitchen BREAST LUMPECTOMY WITH AXILLARY LYMPH NODE DISSECTION Right 06/12/2015   4 mm, T1a,N0 withDCIS with clear margins. ER: 90%, PR 0%; Her 2 neu not overexpressed.   Marland Kitchen CATARACT EXTRACTION W/PHACO Left 02/03/2017   Procedure: CATARACT EXTRACTION PHACO AND INTRAOCULAR LENS PLACEMENT (IOC);  Surgeon: Birder Robson, MD;  Location: [facility] ORS;  Service: Ophthalmology;  Laterality: Left;  [place]  00:28 AP% 16.3 CDE 4.68 Fluid pack lot # [id number]  . CATARACT EXTRACTION W/PHACO Right 03/01/2019   Procedure: CATARACT EXTRACTION PHACO AND INTRAOCULAR LENS PLACEMENT ([place])  RIGHT;  Surgeon: Birder Robson, MD;   Location: [place];  Service: Ophthalmology;  Laterality: Right;  . COLONOSCOPY      Family History  Problem Relation Age of Onset  . Mental illness Mother        Dementia  . Stroke Father   . Cancer Brother        Lung Cancer  . Breast cancer Other     Social History:  reports that she has never smoked. She has never used smokeless tobacco. She reports that she does not drink alcohol or use drugs.  Allergies:  Allergies  Allergen Reactions  . Other Rash    SUTURES-(PARTIAL HYSTERECTOMY)    Medications reviewed.    ROS Full ROS performed and is otherwise negative other than what is stated in HPI   BP 140/81   Pulse 89   Temp (!) 94.6 F (34.8 C) (Temporal)   Ht 5' (1.524 m)   Wt 151 lb 3.2 oz (68.6 kg)   SpO2 97%   BMI 29.53 kg/m   Physical Exam Vitals signs and nursing note reviewed. Exam conducted with a chaperone present.  Constitutional:      General: She is not in acute distress.    Appearance: Normal appearance. She is normal weight. She is not toxic-appearing.  Eyes:     General: No scleral icterus.       Right eye: No discharge.        Left eye: No  discharge.  Neck:     Musculoskeletal: Normal range of motion and neck supple. No neck rigidity or muscular tenderness.  Cardiovascular:     Rate and Rhythm: Normal rate and regular rhythm.     Pulses: Normal pulses.  Pulmonary:     Effort: Pulmonary effort is normal. No respiratory distress.     Breath sounds: No stridor. No wheezing.     Comments: BREAST; changes of right lumpectomy.  No new masses.  No concerning lesions.  No evidence of lymphadenopathy or nipple or skin changes Abdominal:     General: Abdomen is flat. There is no distension.     Palpations: There is no mass.     Tenderness: There is no abdominal tenderness. There is no guarding.     Hernia: No hernia is present.  Musculoskeletal: Normal range of motion.        General: No swelling.  Skin:    General: Skin is warm and  dry.     Capillary Refill: Capillary refill takes less than 2 seconds.  Neurological:     General: No focal deficit present.     Mental Status: She is alert and oriented to person, place, and time.  Psychiatric:        Mood and Affect: Mood normal.        Behavior: Behavior normal.        Thought Content: Thought content normal.        Judgment: Judgment normal.    Assessment/Plan: 83 year old female with a history of right breast cancer with normal physical and mammogram findings.  Recommend yearly mammogram and physical exam.  We will refill the letrozole.  No other acute issues at this time  Greater than 50% of the 25 minutes  visit was spent in counseling/coordination of care   Caroleen Hamman, MD [place] Surgeon

## 2019-06-13 DIAGNOSIS — R69 Illness, unspecified: Secondary | ICD-10-CM | POA: Diagnosis not present

## 2019-08-10 ENCOUNTER — Encounter: Payer: Self-pay | Admitting: Family Medicine

## 2019-08-10 ENCOUNTER — Other Ambulatory Visit: Payer: Self-pay

## 2019-08-10 ENCOUNTER — Ambulatory Visit (INDEPENDENT_AMBULATORY_CARE_PROVIDER_SITE_OTHER): Payer: Medicare HMO | Admitting: Family Medicine

## 2019-08-10 VITALS — BP 138/80 | HR 84 | Ht 60.0 in | Wt 151.0 lb

## 2019-08-10 DIAGNOSIS — F5101 Primary insomnia: Secondary | ICD-10-CM

## 2019-08-10 DIAGNOSIS — E7801 Familial hypercholesterolemia: Secondary | ICD-10-CM | POA: Diagnosis not present

## 2019-08-10 DIAGNOSIS — Z23 Encounter for immunization: Secondary | ICD-10-CM | POA: Diagnosis not present

## 2019-08-10 DIAGNOSIS — I10 Essential (primary) hypertension: Secondary | ICD-10-CM | POA: Diagnosis not present

## 2019-08-10 DIAGNOSIS — G8929 Other chronic pain: Secondary | ICD-10-CM

## 2019-08-10 DIAGNOSIS — M545 Low back pain: Secondary | ICD-10-CM | POA: Diagnosis not present

## 2019-08-10 DIAGNOSIS — R69 Illness, unspecified: Secondary | ICD-10-CM | POA: Diagnosis not present

## 2019-08-10 MED ORDER — TRAZODONE HCL 50 MG PO TABS: 25.0000 mg | ORAL_TABLET | Freq: Every evening | ORAL | 3 refills | Status: DC | PRN

## 2019-08-10 MED ORDER — LOSARTAN POTASSIUM-HCTZ 100-12.5 MG PO TABS: 1.0000 | ORAL_TABLET | Freq: Every day | ORAL | 1 refills | Status: DC

## 2019-08-10 MED ORDER — MELOXICAM 7.5 MG PO TABS: 7.5000 mg | ORAL_TABLET | Freq: Every day | ORAL | 1 refills | Status: DC

## 2019-08-10 NOTE — Patient Instructions (Signed)

## 2019-08-10 NOTE — Progress Notes (Signed)
Date:  08/10/2019   Name:  Kristin Miller   DOB:  09-14-1935   MRN:  [id number]   Chief Complaint: Hypertension, Flu Vaccine, and Insomnia (having hard time getting to sleep)  Hypertension This is a chronic problem. The current episode started more than 1 year ago. The problem has been gradually improving since onset. The problem is controlled. Pertinent negatives include no anxiety, blurred vision, chest pain, headaches, malaise/fatigue, neck pain, orthopnea, palpitations, peripheral edema, PND, shortness of breath or sweats. There are no associated agents to hypertension. Past treatments include ACE inhibitors and diuretics. The current treatment provides moderate improvement. There are no compliance problems.  There is no history of angina, kidney disease, CAD/MI, CVA, heart failure, left ventricular hypertrophy, PVD or retinopathy. There is no history of chronic renal disease, a hypertension causing med or renovascular disease.  Insomnia Primary symptoms: no fragmented sleep, no sleep disturbance, difficulty falling asleep, no somnolence, no frequent awakening, no premature morning awakening, no malaise/fatigue, no napping.   The current episode started more than one month. The onset quality is gradual. The problem occurs intermittently. The problem has been waxing and waning since onset. Relieved by: sitting in chair. Past treatments include nothing. PMH includes: hypertension, chronic pain.  Back Pain This is a recurrent problem. The current episode started more than 1 month ago. The problem occurs intermittently. The problem has been waxing and waning since onset. The pain is present in the lumbar spine and thoracic spine. The pain does not radiate. The pain is mild. Pertinent negatives include no abdominal pain, bladder incontinence, bowel incontinence, chest pain, dysuria, fever, headaches, leg pain, numbness, paresis, paresthesias, pelvic pain or weakness. She has tried NSAIDs for the  symptoms. The treatment provided mild relief.    Lab Results  Component Value Date   CREATININE 0.83 02/04/2019   BUN 18 02/04/2019   NA 142 02/04/2019   K 4.2 02/04/2019   CL 104 02/04/2019   CO2 21 02/04/2019   Lab Results  Component Value Date   CHOL 177 02/04/2019   HDL 47 02/04/2019   LDLCALC 107 (H) 02/04/2019   TRIG 117 02/04/2019   CHOLHDL 3.8 01/18/2018   Lab Results  Component Value Date   TSH 2.91 10/09/2014   Lab Results  Component Value Date   HGBA1C 5.8 10/09/2014     Review of Systems  Constitutional: Negative.  Negative for chills, fatigue, fever, malaise/fatigue and unexpected weight change.  HENT: Negative for congestion, ear discharge, ear pain, rhinorrhea, sinus pressure, sneezing and sore throat.   Eyes: Negative for blurred vision, photophobia, pain, discharge, redness and itching.  Respiratory: Negative for cough, shortness of breath, wheezing and stridor.   Cardiovascular: Negative for chest pain, palpitations, orthopnea and PND.  Gastrointestinal: Negative for abdominal pain, blood in stool, bowel incontinence, constipation, diarrhea, nausea and vomiting.  Endocrine: Negative for cold intolerance, heat intolerance, polydipsia, polyphagia and polyuria.  Genitourinary: Negative for bladder incontinence, dysuria, flank pain, frequency, hematuria, menstrual problem, pelvic pain, urgency, vaginal bleeding and vaginal discharge.  Musculoskeletal: Positive for back pain. Negative for arthralgias, myalgias and neck pain.  Skin: Negative for rash.  Allergic/Immunologic: Negative for environmental allergies and food allergies.  Neurological: Negative for dizziness, weakness, light-headedness, numbness, headaches and paresthesias.  Hematological: Negative for adenopathy. Does not bruise/bleed easily.  Psychiatric/Behavioral: Negative for dysphoric mood and sleep disturbance. The patient has insomnia. The patient is not nervous/anxious.     Patient Active  Problem List   Diagnosis Date  Noted  . Contusion of thigh 08/02/2018  . BP (high blood pressure) 11/15/2015  . OP (osteoporosis) 11/15/2015  . Primary cancer of lower-outer quadrant of right breast ([place]) 06/20/2015  . Medicare annual wellness visit, subsequent 10/09/2014  . Low back pain 08/02/2014  . Establishing care with new doctor, encounter for 08/02/2014  . HTN (hypertension) 08/02/2014  . Basal cell carcinoma in situ of skin of left shoulder 07/31/2014  . Microcalcifications of the breast 11/28/2013  . Abnormal finding on mammography 10/03/2013  . Skin cancer 10/03/2013  . Mammographic microcalcification 05/04/2013  . Encounter for gynecological examination without abnormal finding 04/06/2013  . Chronic bilateral low back pain without sciatica 03/29/2012    Allergies  Allergen Reactions  . Other Rash    SUTURES-(PARTIAL HYSTERECTOMY)    Past Surgical History:  Procedure Laterality Date  . ABDOMINAL HYSTERECTOMY  1970   Partial  . BREAST BIOPSY Right 05/28/2015   INVASIVE MAMMARY CARCINOMA   . BREAST BIOPSY Right 06/12/2015   Wide excision, sentinel lymph node biopsy.  Marland Kitchen BREAST LUMPECTOMY WITH AXILLARY LYMPH NODE DISSECTION Right 06/12/2015   4 mm, T1a,N0 withDCIS with clear margins. ER: 90%, PR 0%; Her 2 neu not overexpressed.   Marland Kitchen CATARACT EXTRACTION W/PHACO Left 02/03/2017   Procedure: CATARACT EXTRACTION PHACO AND INTRAOCULAR LENS PLACEMENT (IOC);  Surgeon: Birder Robson, MD;  Location: [facility];  Service: Ophthalmology;  Laterality: Left;  [place]  00:28 AP% 16.3 CDE 4.68 Fluid pack lot # [id number]  . CATARACT EXTRACTION W/PHACO Right 03/01/2019   Procedure: CATARACT EXTRACTION PHACO AND INTRAOCULAR LENS PLACEMENT ([place])  RIGHT;  Surgeon: Birder Robson, MD;  Location: [place];  Service: Ophthalmology;  Laterality: Right;  . COLONOSCOPY      Social History   Tobacco Use  . Smoking status: Never Smoker  . Smokeless tobacco: Never Used  . Tobacco  comment: smoking cessation materials not required  Substance Use Topics  . Alcohol use: No  . Drug use: No     Medication list has been reviewed and updated.  Current Meds  Medication Sig  . acetaminophen (TYLENOL) 500 MG tablet Take 500 mg by mouth every 6 (six) hours as needed for pain.  . Calcium-Vitamin D (CALTRATE 600 PLUS-VIT D PO) Take 2 tablets by mouth daily.  Marland Kitchen letrozole (FEMARA) 2.5 MG tablet Take 1 tablet (2.5 mg total) by mouth daily.  Marland Kitchen lisinopril-hydrochlorothiazide (ZESTORETIC) 20-12.5 MG tablet Take 1 tablet by mouth daily.  . Multiple Vitamins-Minerals (CENTRUM SILVER ULTRA WOMENS PO) Take 1 tablet by mouth daily.    PHQ 2/9 Scores 08/10/2019 02/04/2019 08/02/2018 07/23/2018  PHQ - 2 Score 0 0 0 0  PHQ- 9 Score 3 0 - 0    BP Readings from Last 3 Encounters:  08/10/19 138/80  05/18/19 140/81  03/01/19 (!) 160/62    Physical Exam Vitals and nursing note reviewed.  Constitutional:      General: She is not in acute distress.    Appearance: She is not diaphoretic.  HENT:     Head: Normocephalic and atraumatic.     Right Ear: Tympanic membrane, ear canal and external ear normal.     Left Ear: Tympanic membrane, ear canal and external ear normal.     Nose: Nose normal. No congestion or rhinorrhea.     Mouth/Throat:     Mouth: Mucous membranes are moist.  Eyes:     General:        Right eye: No discharge.  Left eye: No discharge.     Conjunctiva/sclera: Conjunctivae normal.     Pupils: Pupils are equal, round, and reactive to light.  Neck:     Thyroid: No thyromegaly.     Vascular: No JVD.  Cardiovascular:     Rate and Rhythm: Normal rate and regular rhythm.     Pulses: Normal pulses.     Heart sounds: Normal heart sounds. No murmur. No friction rub. No gallop.   Pulmonary:     Effort: Pulmonary effort is normal.     Breath sounds: Normal breath sounds. No wheezing, rhonchi or rales.  Chest:     Chest wall: No tenderness.  Abdominal:     General:  Bowel sounds are normal.     Palpations: Abdomen is soft. There is no mass.     Tenderness: There is no abdominal tenderness. There is no right CVA tenderness, left CVA tenderness or guarding.  Musculoskeletal:        General: No deformity or signs of injury. Normal range of motion.     Cervical back: Normal range of motion and neck supple.  Lymphadenopathy:     Cervical: No cervical adenopathy.  Skin:    General: Skin is warm and dry.  Neurological:     Mental Status: She is alert.     Deep Tendon Reflexes: Reflexes are normal and symmetric.     Wt Readings from Last 3 Encounters:  08/10/19 151 lb (68.5 kg)  05/18/19 151 lb 3.2 oz (68.6 kg)  03/01/19 149 lb (67.6 kg)    BP 138/80   Pulse 84   Ht 5' (1.524 m)   Wt 151 lb (68.5 kg)   BMI 29.49 kg/m   Assessment and Plan:  1. Essential hypertension Chronic.  Controlled.  Stable.  Patient has experienced a cough most recently since starting for marrow however she is on an ACE inhibitor and for that reason we will switch to losartan hydrochlorothiazide 100-12 0.5 once a day.  We will check a renal function panel to assess GFR.  We will recheck patient in 6 months. - Renal Function Panel - losartan-hydrochlorothiazide (HYZAAR) 100-12.5 MG tablet; Take 1 tablet by mouth daily.  Dispense: 90 tablet; Refill: 1  2. Primary insomnia New onset.  Recurrent.  Episodic.  Some nights patient is having more difficulty initiating sleep particularly if she lies supine.  It is positional therefore and that we have discussed different mattress positions.  In the meantime we will have a trial of trazodone 50 mg 1/2 to 1 tablet at bedtime to see if this may assist her in initiating sleep. - traZODone (DESYREL) 50 MG tablet; Take 0.5-1 tablets (25-50 mg total) by mouth at bedtime as needed for sleep.  Dispense: 30 tablet; Refill: 3  3. Chronic bilateral low back pain without sciatica Chronic.  Recurrent.  Patient is followed in [place] by her  neurosurgeon./Orthopedic.  We will start some meloxicam 7.5 mg in the meantime she will contact her specialist as to the recurrence of the pain if she is unsuccessful we will help her to get a evaluation and recheck near to home. - meloxicam (MOBIC) 7.5 MG tablet; Take 1 tablet (7.5 mg total) by mouth daily.  Dispense: 30 tablet; Refill: 1  4. Familial hypercholesterolemia Chronic.  Controlled.  Stable.  Patient has been given a low-cholesterol sheet.  In the meantime we will check a lipid panel and evaluate and if necessary consider a statin in the future. - Lipid Panel With LDL/HDL  Ratio  5. Influenza vaccine needed Discussed and administered. - Flu Vaccine QUAD High Dose(Fluad)

## 2019-08-11 LAB — RENAL FUNCTION PANEL
Albumin: 4.2 g/dL (ref 3.6–4.6)
BUN/Creatinine Ratio: 26 (ref 12–28)
BUN: 20 mg/dL (ref 8–27)
CO2: 23 mmol/L (ref 20–29)
Calcium: 9.4 mg/dL (ref 8.7–10.3)
Chloride: 104 mmol/L (ref 96–106)
Creatinine, Ser: 0.78 mg/dL (ref 0.57–1.00)
GFR calc Af Amer: 82 mL/min/{1.73_m2} (ref >59)
GFR calc non Af Amer: 72 mL/min/{1.73_m2} (ref >59)
Glucose: 98 mg/dL (ref 65–99)
Phosphorus: 3.6 mg/dL (ref 3.0–4.3)
Potassium: 4.6 mmol/L (ref 3.5–5.2)
Sodium: 140 mmol/L (ref 134–144)

## 2019-08-11 LAB — LIPID PANEL WITH LDL/HDL RATIO
Cholesterol, Total: 168 mg/dL (ref 100–199)
HDL: 49 mg/dL (ref >39)
LDL Chol Calc (NIH): 99 mg/dL (ref 0–99)
LDL/HDL Ratio: 2 ratio (ref 0.0–3.2)
Triglycerides: 108 mg/dL (ref 0–149)
VLDL Cholesterol Cal: 20 mg/dL (ref 5–40)

## 2019-10-05 DIAGNOSIS — H401231 Low-tension glaucoma, bilateral, mild stage: Secondary | ICD-10-CM | POA: Diagnosis not present

## 2019-10-17 DIAGNOSIS — H401231 Low-tension glaucoma, bilateral, mild stage: Secondary | ICD-10-CM | POA: Diagnosis not present

## 2019-11-02 DIAGNOSIS — H401231 Low-tension glaucoma, bilateral, mild stage: Secondary | ICD-10-CM | POA: Diagnosis not present

## 2019-11-14 ENCOUNTER — Other Ambulatory Visit: Payer: Self-pay | Admitting: Family Medicine

## 2019-11-14 DIAGNOSIS — I10 Essential (primary) hypertension: Secondary | ICD-10-CM

## 2019-12-28 ENCOUNTER — Ambulatory Visit: Payer: Medicare HMO

## 2020-02-05 ENCOUNTER — Other Ambulatory Visit: Payer: Self-pay | Admitting: Family Medicine

## 2020-02-05 DIAGNOSIS — I10 Essential (primary) hypertension: Secondary | ICD-10-CM

## 2020-02-05 NOTE — Telephone Encounter (Signed)
Requested Prescriptions  Pending Prescriptions Disp Refills  . losartan-hydrochlorothiazide (HYZAAR) 100-12.5 MG tablet [Pharmacy Med Name: Losartan Potassium-HCTZ 100-12.5 MG Oral Tablet] 90 tablet 0    Sig: Take 1 tablet by mouth once daily     Cardiovascular: ARB + Diuretic Combos Passed - 02/05/2020  2:04 PM      Passed - K in normal range and within 180 days    Potassium  Date Value Ref Range Status  08/10/2019 4.6 3.5 - 5.2 mmol/L Final         Passed - Na in normal range and within 180 days    Sodium  Date Value Ref Range Status  08/10/2019 140 134 - 144 mmol/L Final         Passed - Cr in normal range and within 180 days    Creatinine, Ser  Date Value Ref Range Status  08/10/2019 0.78 0.57 - 1.00 mg/dL Final         Passed - Ca in normal range and within 180 days    Calcium  Date Value Ref Range Status  08/10/2019 9.4 8.7 - 10.3 mg/dL Final         Passed - Patient is not pregnant      Passed - Last BP in normal range    BP Readings from Last 1 Encounters:  08/10/19 138/80         Passed - Valid encounter within last 6 months    Recent Outpatient Visits          5 months ago Essential hypertension   [place], Deanna C, MD   1 year ago Essential hypertension   [place], Deanna C, MD   1 year ago Essential hypertension   [place], Deanna C, MD   2 years ago Essential hypertension   [place], Deanna C, MD   2 years ago Seasonal allergic rhinitis due to pollen   [place], MD      Future Appointments            In 3 days Juline Patch, MD [facility], [facility]

## 2020-02-08 ENCOUNTER — Encounter: Payer: Self-pay | Admitting: Family Medicine

## 2020-02-08 ENCOUNTER — Ambulatory Visit (INDEPENDENT_AMBULATORY_CARE_PROVIDER_SITE_OTHER): Payer: Medicare HMO | Admitting: Family Medicine

## 2020-02-08 ENCOUNTER — Other Ambulatory Visit: Payer: Self-pay

## 2020-02-08 VITALS — BP 130/80 | HR 64 | Ht 60.0 in | Wt 149.0 lb

## 2020-02-08 DIAGNOSIS — I10 Essential (primary) hypertension: Secondary | ICD-10-CM

## 2020-02-08 MED ORDER — LOSARTAN POTASSIUM-HCTZ 100-12.5 MG PO TABS: 1.0000 | ORAL_TABLET | Freq: Every day | ORAL | 1 refills | Status: DC

## 2020-02-08 NOTE — Progress Notes (Signed)
Date:  02/08/2020   Name:  Kristin Miller   DOB:  May 31, 1936   MRN:  [id number]   Chief Complaint: Hypertension  Hypertension This is a chronic problem. The current episode started more than 1 year ago. The problem has been waxing and waning since onset. The problem is controlled. Pertinent negatives include no anxiety, blurred vision, chest pain, headaches, malaise/fatigue, neck pain, orthopnea, palpitations, peripheral edema, PND, shortness of breath or sweats. There are no associated agents to hypertension. Past treatments include angiotensin blockers and diuretics. The current treatment provides moderate improvement. There are no compliance problems.  There is no history of angina, kidney disease, CAD/MI, CVA, heart failure, left ventricular hypertrophy, PVD or retinopathy. There is no history of chronic renal disease, a hypertension causing med or renovascular disease.    Lab Results  Component Value Date   CREATININE 0.78 08/10/2019   BUN 20 08/10/2019   NA 140 08/10/2019   K 4.6 08/10/2019   CL 104 08/10/2019   CO2 23 08/10/2019   Lab Results  Component Value Date   CHOL 168 08/10/2019   HDL 49 08/10/2019   LDLCALC 99 08/10/2019   TRIG 108 08/10/2019   CHOLHDL 3.8 01/18/2018   Lab Results  Component Value Date   TSH 2.91 10/09/2014   Lab Results  Component Value Date   HGBA1C 5.8 10/09/2014   Lab Results  Component Value Date   WBC 8.4 05/26/2016   HGB 13.4 05/26/2016   HCT 40.0 05/26/2016   MCV 87 05/26/2016   PLT 268 05/26/2016   Lab Results  Component Value Date   ALT 19 10/09/2014   AST 24 10/09/2014   ALKPHOS 50 10/09/2014   BILITOT 0.4 10/09/2014     Review of Systems  Constitutional: Negative.  Negative for chills, fatigue, fever, malaise/fatigue and unexpected weight change.  HENT: Negative for congestion, ear discharge, ear pain, rhinorrhea, sinus pressure, sneezing and sore throat.   Eyes: Negative for blurred vision, photophobia, pain,  discharge, redness and itching.  Respiratory: Negative for cough, shortness of breath, wheezing and stridor.   Cardiovascular: Negative for chest pain, palpitations, orthopnea and PND.  Gastrointestinal: Negative for abdominal pain, blood in stool, constipation, diarrhea, nausea and vomiting.  Endocrine: Negative for cold intolerance, heat intolerance, polydipsia, polyphagia and polyuria.  Genitourinary: Negative for dysuria, flank pain, frequency, hematuria, menstrual problem, pelvic pain, urgency, vaginal bleeding and vaginal discharge.  Musculoskeletal: Negative for arthralgias, back pain, myalgias and neck pain.  Skin: Negative for rash.  Allergic/Immunologic: Negative for environmental allergies and food allergies.  Neurological: Negative for dizziness, weakness, light-headedness, numbness and headaches.  Hematological: Negative for adenopathy. Does not bruise/bleed easily.  Psychiatric/Behavioral: Negative for dysphoric mood. The patient is not nervous/anxious.     Patient Active Problem List   Diagnosis Date Noted  . Contusion of thigh 08/02/2018  . BP (high blood pressure) 11/15/2015  . OP (osteoporosis) 11/15/2015  . Primary cancer of lower-outer quadrant of right breast ([place]) 06/20/2015  . Medicare annual wellness visit, subsequent 10/09/2014  . Low back pain 08/02/2014  . Establishing care with new doctor, encounter for 08/02/2014  . HTN (hypertension) 08/02/2014  . Basal cell carcinoma in situ of skin of left shoulder 07/31/2014  . Microcalcifications of the breast 11/28/2013  . Abnormal finding on mammography 10/03/2013  . Skin cancer 10/03/2013  . Mammographic microcalcification 05/04/2013  . Encounter for gynecological examination without abnormal finding 04/06/2013  . Chronic bilateral low back pain without sciatica 03/29/2012  Allergies  Allergen Reactions  . Other Rash    SUTURES-(PARTIAL HYSTERECTOMY)    Past Surgical History:  Procedure Laterality Date    . ABDOMINAL HYSTERECTOMY  1970   Partial  . BREAST BIOPSY Right 05/28/2015   INVASIVE MAMMARY CARCINOMA   . BREAST BIOPSY Right 06/12/2015   Wide excision, sentinel lymph node biopsy.  Marland Kitchen BREAST LUMPECTOMY WITH AXILLARY LYMPH NODE DISSECTION Right 06/12/2015   4 mm, T1a,N0 withDCIS with clear margins. ER: 90%, PR 0%; Her 2 neu not overexpressed.   Marland Kitchen CATARACT EXTRACTION W/PHACO Left 02/03/2017   Procedure: CATARACT EXTRACTION PHACO AND INTRAOCULAR LENS PLACEMENT (IOC);  Surgeon: Birder Robson, MD;  Location: [facility];  Service: Ophthalmology;  Laterality: Left;  [place]  00:28 AP% 16.3 CDE 4.68 Fluid pack lot # [id number]  . CATARACT EXTRACTION W/PHACO Right 03/01/2019   Procedure: CATARACT EXTRACTION PHACO AND INTRAOCULAR LENS PLACEMENT ([place])  RIGHT;  Surgeon: Birder Robson, MD;  Location: [place];  Service: Ophthalmology;  Laterality: Right;  . COLONOSCOPY      Social History   Tobacco Use  . Smoking status: Never Smoker  . Smokeless tobacco: Never Used  . Tobacco comment: smoking cessation materials not required  Vaping Use  . Vaping Use: Never used  Substance Use Topics  . Alcohol use: No  . Drug use: No     Medication list has been reviewed and updated.  Current Meds  Medication Sig  . acetaminophen (TYLENOL) 500 MG tablet Take 500 mg by mouth every 6 (six) hours as needed for pain.  . Calcium-Vitamin D (CALTRATE 600 PLUS-VIT D PO) Take 2 tablets by mouth daily.  Marland Kitchen letrozole (FEMARA) 2.5 MG tablet Take 1 tablet (2.5 mg total) by mouth daily.  Marland Kitchen losartan-hydrochlorothiazide (HYZAAR) 100-12.5 MG tablet Take 1 tablet by mouth once daily  . meloxicam (MOBIC) 7.5 MG tablet Take 1 tablet (7.5 mg total) by mouth daily.  . Multiple Vitamins-Minerals (CENTRUM SILVER ULTRA WOMENS PO) Take 1 tablet by mouth daily.  . [DISCONTINUED] traZODone (DESYREL) 50 MG tablet Take 0.5-1 tablets (25-50 mg total) by mouth at bedtime as needed for sleep.    PHQ 2/9 Scores 02/08/2020  08/10/2019 02/04/2019 08/02/2018  PHQ - 2 Score 1 0 0 0  PHQ- 9 Score 1 3 0 -    GAD 7 : Generalized Anxiety Score 02/08/2020 08/10/2019  Nervous, Anxious, on Edge 0 0  Control/stop worrying 0 0  Worry too much - different things 0 0  Trouble relaxing 0 0  Restless 0 0  Easily annoyed or irritable 0 0  Afraid - awful might happen 0 0  Total GAD 7 Score 0 0    BP Readings from Last 3 Encounters:  02/08/20 130/80  08/10/19 138/80  05/18/19 140/81    Physical Exam Vitals and nursing note reviewed.  Constitutional:      Appearance: She is well-developed.  HENT:     Head: Normocephalic.     Right Ear: External ear normal.     Left Ear: External ear normal.  Eyes:     General: Lids are everted, no foreign bodies appreciated. No scleral icterus.       Left eye: No foreign body or hordeolum.     Conjunctiva/sclera: Conjunctivae normal.     Right eye: Right conjunctiva is not injected.     Left eye: Left conjunctiva is not injected.     Pupils: Pupils are equal, round, and reactive to light.  Neck:     Thyroid:  No thyromegaly.     Vascular: No JVD.     Trachea: No tracheal deviation.  Cardiovascular:     Rate and Rhythm: Normal rate and regular rhythm.     Heart sounds: Normal heart sounds. No murmur heard.  No friction rub. No gallop.   Pulmonary:     Effort: Pulmonary effort is normal. No respiratory distress.     Breath sounds: Normal breath sounds. No wheezing or rales.  Abdominal:     General: Bowel sounds are normal.     Palpations: Abdomen is soft. There is no mass.     Tenderness: There is no abdominal tenderness. There is no guarding or rebound.  Musculoskeletal:        General: No tenderness. Normal range of motion.     Cervical back: Normal range of motion and neck supple.  Lymphadenopathy:     Cervical: No cervical adenopathy.  Skin:    General: Skin is warm.     Findings: No rash.  Neurological:     Mental Status: She is alert and oriented to person, place,  and time.     Cranial Nerves: No cranial nerve deficit.     Deep Tendon Reflexes: Reflexes normal.  Psychiatric:        Mood and Affect: Mood is not anxious or depressed.     Wt Readings from Last 3 Encounters:  02/08/20 149 lb (67.6 kg)  08/10/19 151 lb (68.5 kg)  05/18/19 151 lb 3.2 oz (68.6 kg)    BP 130/80   Pulse 64   Ht 5' (1.524 m)   Wt 149 lb (67.6 kg)   BMI 29.10 kg/m   Assessment and Plan: 1. Essential hypertension Chronic.  Controlled.  Stable.  Continue losartan hydrochlorothiazide 100-12 0.5 once a day.  Review of January renal panel and lipid panel were unremarkable and we will wait until next January for repeat of labs. - losartan-hydrochlorothiazide (HYZAAR) 100-12.5 MG tablet; Take 1 tablet by mouth daily.  Dispense: 90 tablet; Refill: 1

## 2020-03-27 ENCOUNTER — Other Ambulatory Visit: Payer: Self-pay | Admitting: General Surgery

## 2020-03-27 DIAGNOSIS — Z79811 Long term (current) use of aromatase inhibitors: Secondary | ICD-10-CM

## 2020-03-27 DIAGNOSIS — C50511 Malignant neoplasm of lower-outer quadrant of right female breast: Secondary | ICD-10-CM

## 2020-04-03 ENCOUNTER — Telehealth: Payer: Self-pay | Admitting: Family Medicine

## 2020-04-03 NOTE — Telephone Encounter (Signed)
Left message for patient to call back and schedule Medicare Annual Wellness Visit (AWV) either virtually/audio only or in office. Whichever the patients preference is.  Last AWV 08/02/18; please schedule at anytime with [facility] Health Advisor.  This should be a 40 minute visit.

## 2020-04-30 DIAGNOSIS — H401231 Low-tension glaucoma, bilateral, mild stage: Secondary | ICD-10-CM | POA: Diagnosis not present

## 2020-05-02 ENCOUNTER — Ambulatory Visit (INDEPENDENT_AMBULATORY_CARE_PROVIDER_SITE_OTHER): Payer: Medicare HMO

## 2020-05-02 ENCOUNTER — Other Ambulatory Visit: Payer: Self-pay

## 2020-05-02 DIAGNOSIS — Z23 Encounter for immunization: Secondary | ICD-10-CM | POA: Diagnosis not present

## 2020-05-15 ENCOUNTER — Ambulatory Visit
Admission: RE | Admit: 2020-05-15 | Discharge: 2020-05-15 | Disposition: A | Discharge status: Home / Self Care | Payer: Medicare HMO | Source: Ambulatory Visit | Attending: General Surgery | Admitting: General Surgery

## 2020-05-15 ENCOUNTER — Other Ambulatory Visit: Payer: Self-pay

## 2020-05-15 DIAGNOSIS — M8580 Other specified disorders of bone density and structure, unspecified site: Secondary | ICD-10-CM | POA: Insufficient documentation

## 2020-05-15 DIAGNOSIS — Z13820 Encounter for screening for osteoporosis: Secondary | ICD-10-CM | POA: Diagnosis not present

## 2020-05-15 DIAGNOSIS — Z87828 Personal history of other (healed) physical injury and trauma: Secondary | ICD-10-CM | POA: Diagnosis not present

## 2020-05-15 DIAGNOSIS — Z780 Asymptomatic menopausal state: Secondary | ICD-10-CM | POA: Diagnosis not present

## 2020-05-15 DIAGNOSIS — Z853 Personal history of malignant neoplasm of breast: Secondary | ICD-10-CM | POA: Insufficient documentation

## 2020-05-15 DIAGNOSIS — M8589 Other specified disorders of bone density and structure, multiple sites: Secondary | ICD-10-CM | POA: Diagnosis not present

## 2020-05-15 DIAGNOSIS — Z79811 Long term (current) use of aromatase inhibitors: Secondary | ICD-10-CM | POA: Diagnosis not present

## 2020-05-15 DIAGNOSIS — C50511 Malignant neoplasm of lower-outer quadrant of right female breast: Secondary | ICD-10-CM

## 2020-05-15 DIAGNOSIS — R29890 Loss of height: Secondary | ICD-10-CM | POA: Diagnosis not present

## 2020-05-15 DIAGNOSIS — R928 Other abnormal and inconclusive findings on diagnostic imaging of breast: Secondary | ICD-10-CM | POA: Diagnosis not present

## 2020-05-16 ENCOUNTER — Other Ambulatory Visit: Payer: Medicare HMO

## 2020-05-29 DIAGNOSIS — Z853 Personal history of malignant neoplasm of breast: Secondary | ICD-10-CM | POA: Diagnosis not present

## 2020-05-29 DIAGNOSIS — R0600 Dyspnea, unspecified: Secondary | ICD-10-CM | POA: Diagnosis not present

## 2020-06-06 DIAGNOSIS — H0015 Chalazion left lower eyelid: Secondary | ICD-10-CM | POA: Diagnosis not present

## 2020-06-19 DIAGNOSIS — Z170 Estrogen receptor positive status [ER+]: Secondary | ICD-10-CM | POA: Diagnosis not present

## 2020-06-19 DIAGNOSIS — C50511 Malignant neoplasm of lower-outer quadrant of right female breast: Secondary | ICD-10-CM | POA: Diagnosis not present

## 2020-06-20 ENCOUNTER — Encounter: Payer: Self-pay | Admitting: General Surgery

## 2020-06-27 ENCOUNTER — Ambulatory Visit (INDEPENDENT_AMBULATORY_CARE_PROVIDER_SITE_OTHER): Payer: Medicare HMO

## 2020-06-27 ENCOUNTER — Other Ambulatory Visit: Payer: Self-pay

## 2020-06-27 VITALS — BP 120/80 | HR 75 | Temp 97.5°F | Resp 16 | Ht 60.0 in | Wt 148.6 lb

## 2020-06-27 DIAGNOSIS — Z0000 Encounter for general adult medical examination without abnormal findings: Secondary | ICD-10-CM | POA: Diagnosis not present

## 2020-06-27 NOTE — Patient Instructions (Signed)
Kristin Miller , Thank you for taking time to come for your Medicare Wellness Visit. I appreciate your ongoing commitment to your health goals. Please review the following plan we discussed and let me know if I can assist you in the future.   Screening recommendations/referrals: Colonoscopy: no longer required Mammogram: done 05/15/20 Bone Density: done 05/15/20 Recommended yearly ophthalmology/optometry visit for glaucoma screening and checkup Recommended yearly dental visit for hygiene and checkup  Vaccinations: Influenza vaccine: done 05/02/20 Pneumococcal vaccine: done 02/12/17 Tdap vaccine: due Shingles vaccine: Shingrix discussed. Please contact your pharmacy for coverage information.  Covid-19: done 02/08/20 & 03/10/20  Advanced directives: Advance directive discussed with you today. I have provided a copy for you to complete at home and have notarized. Once this is complete please bring a copy in to our office so we can scan it into your chart.  Conditions/risks identified: Keep up the great work!  Next appointment: Follow up in one year for your annual wellness visit    Preventive Care 65 Years and Older, Female Preventive care refers to lifestyle choices and visits with your health care provider that can promote health and wellness. What does preventive care include?  A yearly physical exam. This is also called an annual well check.  Dental exams once or twice a year.  Routine eye exams. Ask your health care provider how often you should have your eyes checked.  Personal lifestyle choices, including:  Daily care of your teeth and gums.  Regular physical activity.  Eating a healthy diet.  Avoiding tobacco and drug use.  Limiting alcohol use.  Practicing safe sex.  Taking low-dose aspirin every day.  Taking vitamin and mineral supplements as recommended by your health care provider. What happens during an annual well check? The services and screenings done by  your health care provider during your annual well check will depend on your age, overall health, lifestyle risk factors, and family history of disease. Counseling  Your health care provider may ask you questions about your:  Alcohol use.  Tobacco use.  Drug use.  Emotional well-being.  Home and relationship well-being.  Sexual activity.  Eating habits.  History of falls.  Memory and ability to understand (cognition).  Work and work Statistician.  Reproductive health. Screening  You may have the following tests or measurements:  Height, weight, and BMI.  Blood pressure.  Lipid and cholesterol levels. These may be checked every 5 years, or more frequently if you are over 28 years old.  Skin check.  Lung cancer screening. You may have this screening every year starting at age 87 if you have a 30-pack-year history of smoking and currently smoke or have quit within the past 15 years.  Fecal occult blood test (FOBT) of the stool. You may have this test every year starting at age 9.  Flexible sigmoidoscopy or colonoscopy. You may have a sigmoidoscopy every 5 years or a colonoscopy every 10 years starting at age 11.  Hepatitis C blood test.  Hepatitis B blood test.  Sexually transmitted disease (STD) testing.  Diabetes screening. This is done by checking your blood sugar (glucose) after you have not eaten for a while (fasting). You may have this done every 1-3 years.  Bone density scan. This is done to screen for osteoporosis. You may have this done starting at age 85.  Mammogram. This may be done every 1-2 years. Talk to your health care provider about how often you should have regular mammograms. Talk with your health  care provider about your test results, treatment options, and if necessary, the need for more tests. Vaccines  Your health care provider may recommend certain vaccines, such as:  Influenza vaccine. This is recommended every year.  Tetanus,  diphtheria, and acellular pertussis (Tdap, Td) vaccine. You may need a Td booster every 10 years.  Zoster vaccine. You may need this after age 42.  Pneumococcal 13-valent conjugate (PCV13) vaccine. One dose is recommended after age 68.  Pneumococcal polysaccharide (PPSV23) vaccine. One dose is recommended after age 44. Talk to your health care provider about which screenings and vaccines you need and how often you need them. This information is not intended to replace advice given to you by your health care provider. Make sure you discuss any questions you have with your health care provider. Document Released: 08/03/2015 Document Revised: 03/26/2016 Document Reviewed: 05/08/2015 Elsevier Interactive Patient Education  2017 [place] Prevention in the Home Falls can cause injuries. They can happen to people of all ages. There are many things you can do to make your home safe and to help prevent falls. What can I do on the outside of my home?  Regularly fix the edges of walkways and driveways and fix any cracks.  Remove anything that might make you trip as you walk through a door, such as a raised step or threshold.  Trim any bushes or trees on the path to your home.  Use bright outdoor lighting.  Clear any walking paths of anything that might make someone trip, such as rocks or tools.  Regularly check to see if handrails are loose or broken. Make sure that both sides of any steps have handrails.  Any raised decks and porches should have guardrails on the edges.  Have any leaves, snow, or ice cleared regularly.  Use sand or salt on walking paths during winter.  Clean up any spills in your garage right away. This includes oil or grease spills. What can I do in the bathroom?  Use night lights.  Install grab bars by the toilet and in the tub and shower. Do not use towel bars as grab bars.  Use non-skid mats or decals in the tub or shower.  If you need to sit down in  the shower, use a plastic, non-slip stool.  Keep the floor dry. Clean up any water that spills on the floor as soon as it happens.  Remove soap buildup in the tub or shower regularly.  Attach bath mats securely with double-sided non-slip rug tape.  Do not have throw rugs and other things on the floor that can make you trip. What can I do in the bedroom?  Use night lights.  Make sure that you have a light by your bed that is easy to reach.  Do not use any sheets or blankets that are too big for your bed. They should not hang down onto the floor.  Have a firm chair that has side arms. You can use this for support while you get dressed.  Do not have throw rugs and other things on the floor that can make you trip. What can I do in the kitchen?  Clean up any spills right away.  Avoid walking on wet floors.  Keep items that you use a lot in easy-to-reach places.  If you need to reach something above you, use a strong step stool that has a grab bar.  Keep electrical cords out of the way.  Do not use floor  polish or wax that makes floors slippery. If you must use wax, use non-skid floor wax.  Do not have throw rugs and other things on the floor that can make you trip. What can I do with my stairs?  Do not leave any items on the stairs.  Make sure that there are handrails on both sides of the stairs and use them. Fix handrails that are broken or loose. Make sure that handrails are as long as the stairways.  Check any carpeting to make sure that it is firmly attached to the stairs. Fix any carpet that is loose or worn.  Avoid having throw rugs at the top or bottom of the stairs. If you do have throw rugs, attach them to the floor with carpet tape.  Make sure that you have a light switch at the top of the stairs and the bottom of the stairs. If you do not have them, ask someone to add them for you. What else can I do to help prevent falls?  Wear shoes that:  Do not have high  heels.  Have rubber bottoms.  Are comfortable and fit you well.  Are closed at the toe. Do not wear sandals.  If you use a stepladder:  Make sure that it is fully opened. Do not climb a closed stepladder.  Make sure that both sides of the stepladder are locked into place.  Ask someone to hold it for you, if possible.  Clearly mark and make sure that you can see:  Any grab bars or handrails.  First and last steps.  Where the edge of each step is.  Use tools that help you move around (mobility aids) if they are needed. These include:  Canes.  Walkers.  Scooters.  Crutches.  Turn on the lights when you go into a dark area. Replace any light bulbs as soon as they burn out.  Set up your furniture so you have a clear path. Avoid moving your furniture around.  If any of your floors are uneven, fix them.  If there are any pets around you, be aware of where they are.  Review your medicines with your doctor. Some medicines can make you feel dizzy. This can increase your chance of falling. Ask your doctor what other things that you can do to help prevent falls. This information is not intended to replace advice given to you by your health care provider. Make sure you discuss any questions you have with your health care provider. Document Released: 05/03/2009 Document Revised: 12/13/2015 Document Reviewed: 08/11/2014 Elsevier Interactive Patient Education  2017 [employer].

## 2020-06-27 NOTE — Progress Notes (Signed)
Subjective:   Kristin Miller is a 84 y.o. female who presents for Medicare Annual (Subsequent) preventive examination.  Review of Systems     Cardiac Risk Factors include: advanced age (>68mn, >>79women);hypertension     Objective:    Today's Vitals   06/27/20 1110  BP: 120/80  Pulse: 75  Resp: 16  Temp: (!) 97.5 F (36.4 C)  TempSrc: Oral  SpO2: 97%  Weight: 148 lb 9.6 oz (67.4 kg)  Height: 5' (1.524 m)   Body mass index is 29.02 kg/m.  Advanced Directives 06/27/2020 03/01/2019 08/02/2018 11/23/2017 07/30/2017 06/12/2015 05/21/2015  Does Patient Have a Medical Advance Directive? _0  No Yes  Type of Advance Directive - - - - - - HPress photographerLiving will  Copy of HMalagain Chart? - - - - - - No - copy requested  Would patient like information on creating a medical advance directive? Yes (MAU/Ambulatory/Procedural Areas - Information given) No - Patient declined Yes (MAU/Ambulatory/Procedural Areas - Information given) No - Patient declined Yes (MAU/Ambulatory/Procedural Areas - Information given) No - patient declined information -    Current Medications (verified) Outpatient Encounter Medications as of 06/27/2020  Medication Sig  . acetaminophen (TYLENOL) 500 MG tablet Take 500 mg by mouth every 6 (six) hours as needed for pain.  . Calcium-Vitamin D (CALTRATE 600 PLUS-VIT D PO) Take 2 tablets by mouth daily.  . COMBIGAN 0.2-0.5 % ophthalmic solution Place 1 drop into both eyes in the morning and at bedtime.  .Marland Kitchenletrozole (FEMARA) 2.5 MG tablet Take 1 tablet (2.5 mg total) by mouth daily.  .Marland Kitchenlosartan-hydrochlorothiazide (HYZAAR) 100-12.5 MG tablet Take 1 tablet by mouth daily.  . meloxicam (MOBIC) 7.5 MG tablet Take 1 tablet (7.5 mg total) by mouth daily.  . Multiple Vitamins-Minerals (CENTRUM SILVER ULTRA WOMENS PO) Take 1 tablet by mouth daily.   No facility-administered encounter medications on file as of 06/27/2020.     Allergies (verified) Other   History: Past Medical History:  Diagnosis Date  . Allergy   . Anemia    H/O AS A CHILD  . Arthritis   . Back pain   . Breast cancer of upper-outer quadrant of right female breast ([place] 06/12/2015   4 millimeter, T1a, N0; ER 90%, PR 0%, HER-2/neu not overexpressed.  Declined radiation therapy.  . Collar bone fracture 05/28/2013   Fractured Collar bone on left  . Dysrhythmia    h/o heart skipping beat years ago  . Frequent headaches    h/o migraines  . Hypertension   . Osteopenia 04/2018   Bone density without significant interval change since 2017.   Past Surgical History:  Procedure Laterality Date  . ABDOMINAL HYSTERECTOMY  1970   Partial  . BREAST BIOPSY Right 05/28/2015   INVASIVE MAMMARY CARCINOMA   . BREAST BIOPSY Right 06/12/2015   Wide excision, sentinel lymph node biopsy.  .Marland KitchenBREAST LUMPECTOMY WITH AXILLARY LYMPH NODE DISSECTION Right 06/12/2015   4 mm, T1a,N0 withDCIS with clear margins. ER: 90%, PR 0%; Her 2 neu not overexpressed.   .Marland KitchenCATARACT EXTRACTION W/PHACO Left 02/03/2017   Procedure: CATARACT EXTRACTION PHACO AND INTRAOCULAR LENS PLACEMENT (IOC);  Surgeon: PBirder Robson MD;  Location: [facility];  Service: Ophthalmology;  Laterality: Left;  [place] 00:28 AP% 16.3 CDE 4.68 Fluid pack lot # [id number]  . CATARACT EXTRACTION W/PHACO Right 03/01/2019   Procedure: CATARACT EXTRACTION PHACO AND INTRAOCULAR LENS PLACEMENT (IOC)  RIGHT;  Surgeon:  Birder Robson, MD;  Location: [facility];  Service: Ophthalmology;  Laterality: Right;  . COLONOSCOPY     Family History  Problem Relation Age of Onset  . Mental illness Mother        Dementia  . Stroke Father   . Cancer Brother        Lung Cancer  . Breast cancer Other    Social History   Socioeconomic History  . Marital status: Married    Spouse name: Not on file  . Number of children: 3  . Years of education: GED  . Highest education level: 12th grade  Occupational  History  . Occupation: Retired  Tobacco Use  . Smoking status: Never Smoker  . Smokeless tobacco: Never Used  . Tobacco comment: smoking cessation materials not required  Vaping Use  . Vaping Use: Never used  Substance and Sexual Activity  . Alcohol use: No  . Drug use: No  . Sexual activity: Not Currently  Other Topics Concern  . Not on file  Social History Narrative  . Not on file   Social Determinants of Health   Financial Resource Strain: Low Risk   . Difficulty of Paying Living Expenses: Not hard at all  Food Insecurity: No Food Insecurity  . Worried About Charity fundraiser in the Last Year: Never true  . Ran Out of Food in the Last Year: Never true  Transportation Needs: No Transportation Needs  . Lack of Transportation (Medical): No  . Lack of Transportation (Non-Medical): No  Physical Activity: Inactive  . Days of Exercise per Week: 0 days  . Minutes of Exercise per Session: 0 min  Stress: No Stress Concern Present  . Feeling of Stress : Not at all  Social Connections: Moderately Integrated  . Frequency of Communication with Friends and Family: More than three times a week  . Frequency of Social Gatherings with Friends and Family: More than three times a week  . Attends Religious Services: More than 4 times per year  . Active Member of Clubs or Organizations: No  . Attends Archivist Meetings: Never  . Marital Status: Married    Tobacco Counseling Counseling given: Not Answered Comment: smoking cessation materials not required   Clinical Intake:  Pre-visit preparation completed: Yes  Pain : No/denies pain     Nutritional Risks: None Diabetes: No  How often do you need to have someone help you when you read instructions, pamphlets, or other written materials from your doctor or pharmacy?: 1 - Never    Interpreter Needed?: No  Information entered by :: Clemetine Marker LPN   Activities of Daily Living In your present state of health, do  you have any difficulty performing the following activities: 06/27/2020  Hearing? Y  Comment slight hearing difficulty; declines hearing aids  Vision? N  Difficulty concentrating or making decisions? N  Walking or climbing stairs? N  Dressing or bathing? N  Doing errands, shopping? N  Preparing Food and eating ? N  Using the Toilet? N  In the past six months, have you accidently leaked urine? N  Do you have problems with loss of bowel control? N  Managing your Medications? N  Managing your Finances? N  Housekeeping or managing your Housekeeping? N  Some recent data might be hidden    Patient Care Team: Juline Patch, MD as PCP - General (Family Medicine) Bary Castilla, Forest Gleason, MD (General Surgery) Birder Robson, MD as Consulting Physician (Ophthalmology)  Indicate any  recent Medical Services you may have received from other than [facility] providers in the past year (date may be approximate).     Assessment:   This is a routine wellness examination for Lanya.  Hearing/Vision screen  Hearing Screening   _0  _1  _2  _3  _4  _5  _6  _7  _8   Right ear:           Left ear:           Comments: Pt c/o mild hearing difficulty in left ear; declines hearing aids   Vision Screening Comments: [facility]   Dietary issues and exercise activities discussed: Current Exercise Habits: The patient does not participate in regular exercise at present (no exercise routine but active at home), Exercise limited by: None identified  Goals    . DIET - EAT MORE FRUITS AND VEGETABLES     Recommend eating 3-4 servings of fresh fruits and vegetables per day    . DIET - INCREASE WATER INTAKE     Recommend to drink at least 6-8 8oz glasses of water per day      Depression Screen PHQ 2/9 Scores 06/27/2020 02/08/2020 08/10/2019 02/04/2019 08/02/2018 07/23/2018 07/30/2017  PHQ - 2 Score 0 1 0 0 0 0 0  PHQ- 9 Score - 1 3 0 - 0 -    Fall  Risk Fall Risk  06/27/2020 02/08/2020 08/10/2019 05/18/2019 08/02/2018  Falls in the past year? 0 0 0 0 1  Number falls in past yr: 0 - - 0 1  Injury with Fall? 0 - - 0 0  Risk for fall due to : No Fall Risks - - - -  Follow up Falls prevention discussed Falls evaluation completed Falls evaluation completed - Falls evaluation completed;Falls prevention discussed    FALL RISK PREVENTION PERTAINING TO THE HOME:  Any stairs in or around the home? Yes  If so, are there any without handrails? No  Home free of loose throw rugs in walkways, pet beds, electrical cords, etc? Yes  Adequate lighting in your home to reduce risk of falls? Yes   ASSISTIVE DEVICES UTILIZED TO PREVENT FALLS:  Life alert? No  Use of a cane, walker or w/c? No  Grab bars in the bathroom? No  Shower chair or bench in shower? Yes  Elevated toilet seat or a handicapped toilet? No   TIMED UP AND GO:  Was the test performed? Yes .  Length of time to ambulate 10 feet: 5 sec.   Gait steady and fast without use of assistive device  Cognitive Function:     6CIT Screen 06/27/2020 08/02/2018 07/30/2017  What Year? 0 points 0 points 0 points  What month? 0 points 0 points 0 points  What time? 0 points 0 points 0 points  Count back from 20 0 points 0 points 0 points  Months in reverse 2 points 0 points 0 points  Repeat phrase 2 points 0 points 0 points  Total Score 4 0 0    Immunizations Immunization History  Administered Date(s) Administered  . Fluad Quad(high Dose 65+) 08/10/2019, 05/02/2020  . Influenza Split 03/27/2014  . Influenza, High Dose Seasonal PF 07/30/2017, 07/23/2018  . Influenza,inj,Quad PF,6+ Mos 04/05/2014  . Moderna SARS-COVID-2 Vaccination 02/08/2020, 03/10/2020  . Pneumococcal Conjugate-13 10/04/2013  . Pneumococcal Polysaccharide-23 02/12/2017  . Tdap 07/21/2008    TDAP status: Due, Education has been provided regarding the importance of this vaccine. Advised may receive this vaccine at local  pharmacy or Health Dept.  Aware to provide a copy of the vaccination record if obtained from local pharmacy or Health Dept. Verbalized acceptance and understanding.  Flu Vaccine status: Up to date  Pneumococcal vaccine status: Up to date  Covid-19 vaccine status: Completed vaccines  Qualifies for Shingles Vaccine? Yes   Zostavax completed No   Shingrix Completed?: No.    Education has been provided regarding the importance of this vaccine. Patient has been advised to call insurance company to determine out of pocket expense if they have not yet received this vaccine. Advised may also receive vaccine at local pharmacy or Health Dept. Verbalized acceptance and understanding.  Screening Tests Health Maintenance  Topic Date Due  . MAMMOGRAM  05/15/2021  . TETANUS/TDAP  07/31/2024  . INFLUENZA VACCINE  Completed  . DEXA SCAN  Completed  . COVID-19 Vaccine  Completed  . PNA vac Low Risk Adult  Completed    Health Maintenance  There are no preventive care reminders to display for this patient.   Colorectal cancer screening: No longer required.   Mammogram status: Completed 05/15/20. Repeat every year  Bone Density status: Completed 05/15/20. Results reflect: Bone density results: OSTEOPENIA. Repeat every 2 years.  Lung Cancer Screening: (Low Dose CT Chest recommended if Age 61-80 years, 30 pack-year currently smoking OR have quit w/in 15years.) does not qualify.   Additional Screening:  Hepatitis C Screening: does not qualify;   Vision Screening: Recommended annual ophthalmology exams for early detection of glaucoma and other disorders of the eye. Is the patient up to date with their annual eye exam?  Yes  Who is the provider or what is the name of the office in which the patient attends annual eye exams? [place] Screening: Recommended annual dental exams for proper oral hygiene  Community Resource Referral / Chronic Care Management: CRR required this visit?   No   CCM required this visit?  No      Plan:     I have personally reviewed and noted the following in the patient's chart:   . Medical and social history . Use of alcohol, tobacco or illicit drugs  . Current medications and supplements . Functional ability and status . Nutritional status . Physical activity . Advanced directives . List of other physicians . Hospitalizations, surgeries, and ER visits in previous 12 months . Vitals . Screenings to include cognitive, depression, and falls . Referrals and appointments  In addition, I have reviewed and discussed with patient certain preventive protocols, quality metrics, and best practice recommendations. A written personalized care plan for preventive services as well as general preventive health recommendations were provided to patient.     Clemetine Marker, LPN   37/03/239   Nurse Notes: pt concerned about having to remain on letrozole per oncology even after 5 years of taking. Per last note from Dr. Bary Castilla pt to have BCI testing and follow up in one year.

## 2020-07-09 DIAGNOSIS — H01115 Allergic dermatitis of left lower eyelid: Secondary | ICD-10-CM | POA: Diagnosis not present

## 2020-07-09 DIAGNOSIS — H01112 Allergic dermatitis of right lower eyelid: Secondary | ICD-10-CM | POA: Diagnosis not present

## 2020-08-07 ENCOUNTER — Encounter: Payer: Self-pay | Admitting: Family Medicine

## 2020-08-07 ENCOUNTER — Other Ambulatory Visit: Payer: Self-pay

## 2020-08-07 ENCOUNTER — Ambulatory Visit (INDEPENDENT_AMBULATORY_CARE_PROVIDER_SITE_OTHER): Payer: Medicare HMO | Admitting: Family Medicine

## 2020-08-07 VITALS — BP 138/80 | HR 60 | Ht 60.0 in | Wt 148.0 lb

## 2020-08-07 DIAGNOSIS — E7801 Familial hypercholesterolemia: Secondary | ICD-10-CM

## 2020-08-07 DIAGNOSIS — I10 Essential (primary) hypertension: Secondary | ICD-10-CM | POA: Diagnosis not present

## 2020-08-07 MED ORDER — LOSARTAN POTASSIUM-HCTZ 100-12.5 MG PO TABS: 1.0000 | ORAL_TABLET | Freq: Every day | ORAL | 1 refills | Status: DC

## 2020-08-07 NOTE — Progress Notes (Signed)
Date:  08/07/2020   Name:  Kristin Miller   DOB:  02/13/1936   MRN:  [id number]   Chief Complaint: Hypertension  Hypertension This is a chronic problem. The current episode started more than 1 year ago. The problem has been gradually improving since onset. The problem is controlled. Pertinent negatives include no anxiety, blurred vision, chest pain, headaches, malaise/fatigue, neck pain, orthopnea, palpitations, peripheral edema, PND, shortness of breath or sweats. There are no associated agents to hypertension. The current treatment provides moderate improvement. There are no compliance problems.  There is no history of angina, kidney disease, CAD/MI, CVA, heart failure, left ventricular hypertrophy, PVD or retinopathy. There is no history of chronic renal disease, a hypertension causing med or renovascular disease.  Hyperlipidemia This is a chronic problem. The current episode started more than 1 year ago. The problem is controlled. Recent lipid tests were reviewed and are variable. She has no history of chronic renal disease, diabetes, hypothyroidism, liver disease, obesity or nephrotic syndrome. Pertinent negatives include no chest pain, myalgias or shortness of breath. Current antihyperlipidemic treatment includes diet change. The current treatment provides moderate improvement of lipids.    Lab Results  Component Value Date   CREATININE 0.78 08/10/2019   BUN 20 08/10/2019   NA 140 08/10/2019   K 4.6 08/10/2019   CL 104 08/10/2019   CO2 23 08/10/2019   Lab Results  Component Value Date   CHOL 168 08/10/2019   HDL 49 08/10/2019   LDLCALC 99 08/10/2019   TRIG 108 08/10/2019   CHOLHDL 3.8 01/18/2018   Lab Results  Component Value Date   TSH 2.91 10/09/2014   Lab Results  Component Value Date   HGBA1C 5.8 10/09/2014   Lab Results  Component Value Date   WBC 8.4 05/26/2016   HGB 13.4 05/26/2016   HCT 40.0 05/26/2016   MCV 87 05/26/2016   PLT 268 05/26/2016   Lab  Results  Component Value Date   ALT 19 10/09/2014   AST 24 10/09/2014   ALKPHOS 50 10/09/2014   BILITOT 0.4 10/09/2014     Review of Systems  Constitutional: Negative.  Negative for chills, fatigue, fever, malaise/fatigue and unexpected weight change.  HENT: Negative for congestion, ear discharge, ear pain, rhinorrhea, sinus pressure, sneezing and sore throat.   Eyes: Negative for blurred vision, double vision, photophobia, pain, discharge, redness and itching.  Respiratory: Negative for cough, shortness of breath, wheezing and stridor.   Cardiovascular: Negative for chest pain, palpitations, orthopnea and PND.  Gastrointestinal: Negative for abdominal pain, blood in stool, constipation, diarrhea, nausea and vomiting.  Endocrine: Negative for cold intolerance, heat intolerance, polydipsia, polyphagia and polyuria.  Genitourinary: Negative for dysuria, flank pain, frequency, hematuria, menstrual problem, pelvic pain, urgency, vaginal bleeding and vaginal discharge.  Musculoskeletal: Negative for arthralgias, back pain, myalgias and neck pain.  Skin: Negative for rash.  Allergic/Immunologic: Negative for environmental allergies and food allergies.  Neurological: Negative for dizziness, weakness, light-headedness, numbness and headaches.  Hematological: Negative for adenopathy. Does not bruise/bleed easily.  Psychiatric/Behavioral: Negative for dysphoric mood. The patient is not nervous/anxious.     Patient Active Problem List   Diagnosis Date Noted  . Contusion of thigh 08/02/2018  . BP (high blood pressure) 11/15/2015  . OP (osteoporosis) 11/15/2015  . Primary cancer of lower-outer quadrant of right breast ([place]) 06/20/2015  . Medicare annual wellness visit, subsequent 10/09/2014  . Low back pain 08/02/2014  . Establishing care with new doctor, encounter for 08/02/2014  .  HTN (hypertension) 08/02/2014  . Basal cell carcinoma in situ of skin of left shoulder 07/31/2014  .  Microcalcifications of the breast 11/28/2013  . Abnormal finding on mammography 10/03/2013  . Skin cancer 10/03/2013  . Mammographic microcalcification 05/04/2013  . Encounter for gynecological examination without abnormal finding 04/06/2013  . Chronic bilateral low back pain without sciatica 03/29/2012    Allergies  Allergen Reactions  . Other Rash    SUTURES-(PARTIAL HYSTERECTOMY)    Past Surgical History:  Procedure Laterality Date  . ABDOMINAL HYSTERECTOMY  1970   Partial  . BREAST BIOPSY Right 05/28/2015   INVASIVE MAMMARY CARCINOMA   . BREAST BIOPSY Right 06/12/2015   Wide excision, sentinel lymph node biopsy.  Marland Kitchen BREAST LUMPECTOMY WITH AXILLARY LYMPH NODE DISSECTION Right 06/12/2015   4 mm, T1a,N0 withDCIS with clear margins. ER: 90%, PR 0%; Her 2 neu not overexpressed.   Marland Kitchen CATARACT EXTRACTION W/PHACO Left 02/03/2017   Procedure: CATARACT EXTRACTION PHACO AND INTRAOCULAR LENS PLACEMENT (IOC);  Surgeon: Birder Robson, MD;  Location: [facility];  Service: Ophthalmology;  Laterality: Left;  [place]  00:28 AP% 16.3 CDE 4.68 Fluid pack lot # [id number]  . CATARACT EXTRACTION W/PHACO Right 03/01/2019   Procedure: CATARACT EXTRACTION PHACO AND INTRAOCULAR LENS PLACEMENT ([place])  RIGHT;  Surgeon: Birder Robson, MD;  Location: [place] [facility];  Service: Ophthalmology;  Laterality: Right;  . COLONOSCOPY      Social History   Tobacco Use  . Smoking status: Never Smoker  . Smokeless tobacco: Never Used  . Tobacco comment: smoking cessation materials not required  Vaping Use  . Vaping Use: Never used  Substance Use Topics  . Alcohol use: No  . Drug use: No     Medication list has been reviewed and updated.  Current Meds  Medication Sig  . acetaminophen (TYLENOL) 500 MG tablet Take 500 mg by mouth every 6 (six) hours as needed for pain.  . Calcium-Vitamin D (CALTRATE 600 PLUS-VIT D PO) Take 2 tablets by mouth daily.  . COMBIGAN 0.2-0.5 % ophthalmic solution Place 1 drop  into both eyes in the morning and at bedtime.  Marland Kitchen letrozole (FEMARA) 2.5 MG tablet Take 1 tablet (2.5 mg total) by mouth daily.  Marland Kitchen losartan-hydrochlorothiazide (HYZAAR) 100-12.5 MG tablet Take 1 tablet by mouth daily.  . Multiple Vitamins-Minerals (CENTRUM SILVER ULTRA WOMENS PO) Take 1 tablet by mouth daily.    PHQ 2/9 Scores 06/27/2020 02/08/2020 08/10/2019 02/04/2019  PHQ - 2 Score 0 1 0 0  PHQ- 9 Score - 1 3 0    GAD 7 : Generalized Anxiety Score 02/08/2020 08/10/2019  Nervous, Anxious, on Edge 0 0  Control/stop worrying 0 0  Worry too much - different things 0 0  Trouble relaxing 0 0  Restless 0 0  Easily annoyed or irritable 0 0  Afraid - awful might happen 0 0  Total GAD 7 Score 0 0    BP Readings from Last 3 Encounters:  08/07/20 138/80  06/27/20 120/80  02/08/20 130/80    Physical Exam Vitals and nursing note reviewed.  Constitutional:      Appearance: She is well-developed and well-nourished.  HENT:     Head: Normocephalic.     Right Ear: Tympanic membrane, ear canal and external ear normal. There is no impacted cerumen.     Left Ear: Tympanic membrane, ear canal and external ear normal. There is no impacted cerumen.     Nose: Nose normal.     Mouth/Throat:  Mouth: Oropharynx is clear and moist.  Eyes:     General: Lids are everted, no foreign bodies appreciated. No scleral icterus.       Left eye: No foreign body or hordeolum.     Extraocular Movements: EOM normal.     Conjunctiva/sclera: Conjunctivae normal.     Right eye: Right conjunctiva is not injected.     Left eye: Left conjunctiva is not injected.     Pupils: Pupils are equal, round, and reactive to light.  Neck:     Thyroid: No thyromegaly.     Vascular: No JVD.     Trachea: No tracheal deviation.  Cardiovascular:     Rate and Rhythm: Normal rate and regular rhythm.     Pulses: Intact distal pulses.     Heart sounds: Normal heart sounds. No murmur heard. No friction rub. No gallop.   Pulmonary:      Effort: Pulmonary effort is normal. No respiratory distress.     Breath sounds: Normal breath sounds. No wheezing, rhonchi or rales.  Abdominal:     General: Bowel sounds are normal.     Palpations: Abdomen is soft. There is no hepatosplenomegaly or mass.     Tenderness: There is no abdominal tenderness. There is no guarding or rebound.  Musculoskeletal:        General: No tenderness or edema. Normal range of motion.     Cervical back: Normal range of motion and neck supple.  Lymphadenopathy:     Cervical: No cervical adenopathy.  Skin:    General: Skin is warm.     Findings: No rash.  Neurological:     Mental Status: She is alert and oriented to person, place, and time.     Cranial Nerves: No cranial nerve deficit.     Deep Tendon Reflexes: Strength normal. Reflexes normal.  Psychiatric:        Mood and Affect: Mood and affect normal. Mood is not anxious or depressed.     Wt Readings from Last 3 Encounters:  08/07/20 148 lb (67.1 kg)  06/27/20 148 lb 9.6 oz (67.4 kg)  02/08/20 149 lb (67.6 kg)    BP 138/80   Pulse 60   Ht 5' (1.524 m)   Wt 148 lb (67.1 kg)   BMI 28.90 kg/m   Assessment and Plan:  1. Essential hypertension Chronic.  Controlled.  Stable.  Blood pressure is 138/80.  Continue losartan hydrochlorothiazide 100-12.5 mg once a day.  Will check renal function panel. - Renal Function Panel - losartan-hydrochlorothiazide (HYZAAR) 100-12.5 MG tablet; Take 1 tablet by mouth daily.  Dispense: 90 tablet; Refill: 1  2. Familial hypercholesterolemia Chronic.  Controlled.  Stable.  Patient is diet controlled.  We will check lipid panel for current status and have discussed the possibility of starting a statin if LDL is significantly elevated. - Lipid Panel With LDL/HDL Ratio

## 2020-08-08 LAB — LIPID PANEL WITH LDL/HDL RATIO
Cholesterol, Total: 189 mg/dL (ref 100–199)
HDL: 43 mg/dL (ref >39)
LDL Chol Calc (NIH): 127 mg/dL — ABNORMAL HIGH (ref 0–99)
LDL/HDL Ratio: 3 ratio (ref 0.0–3.2)
Triglycerides: 107 mg/dL (ref 0–149)
VLDL Cholesterol Cal: 19 mg/dL (ref 5–40)

## 2020-08-08 LAB — RENAL FUNCTION PANEL
Albumin: 4 g/dL (ref 3.6–4.6)
BUN/Creatinine Ratio: 17 (ref 12–28)
BUN: 13 mg/dL (ref 8–27)
CO2: 27 mmol/L (ref 20–29)
Calcium: 9.4 mg/dL (ref 8.7–10.3)
Chloride: 102 mmol/L (ref 96–106)
Creatinine, Ser: 0.76 mg/dL (ref 0.57–1.00)
GFR calc Af Amer: 84 mL/min/{1.73_m2} (ref >59)
GFR calc non Af Amer: 73 mL/min/{1.73_m2} (ref >59)
Glucose: 100 mg/dL — ABNORMAL HIGH (ref 65–99)
Phosphorus: 6 mg/dL — ABNORMAL HIGH (ref 3.0–4.3)
Potassium: 4.5 mmol/L (ref 3.5–5.2)
Sodium: 140 mmol/L (ref 134–144)

## 2020-08-13 ENCOUNTER — Ambulatory Visit: Payer: Medicare HMO | Admitting: Family Medicine

## 2020-08-22 DIAGNOSIS — H401134 Primary open-angle glaucoma, bilateral, indeterminate stage: Secondary | ICD-10-CM | POA: Diagnosis not present

## 2020-09-12 DIAGNOSIS — H401134 Primary open-angle glaucoma, bilateral, indeterminate stage: Secondary | ICD-10-CM | POA: Diagnosis not present

## 2020-10-04 DIAGNOSIS — H401131 Primary open-angle glaucoma, bilateral, mild stage: Secondary | ICD-10-CM | POA: Diagnosis not present

## 2020-11-12 ENCOUNTER — Ambulatory Visit
Admission: EM | Admit: 2020-11-12 | Discharge: 2020-11-12 | Disposition: A | Discharge status: Home / Self Care | Payer: Medicare HMO | Attending: Physician Assistant | Admitting: Physician Assistant

## 2020-11-12 ENCOUNTER — Other Ambulatory Visit: Payer: Self-pay

## 2020-11-12 ENCOUNTER — Encounter: Payer: Self-pay | Admitting: Emergency Medicine

## 2020-11-12 DIAGNOSIS — Z20822 Contact with and (suspected) exposure to covid-19: Secondary | ICD-10-CM | POA: Diagnosis not present

## 2020-11-12 DIAGNOSIS — R0981 Nasal congestion: Secondary | ICD-10-CM | POA: Insufficient documentation

## 2020-11-12 DIAGNOSIS — B349 Viral infection, unspecified: Secondary | ICD-10-CM | POA: Diagnosis not present

## 2020-11-12 LAB — SARS CORONAVIRUS 2 (TAT 6-24 HRS): SARS Coronavirus 2: POSITIVE — AB

## 2020-11-12 MED ORDER — BENZONATATE 100 MG PO CAPS: 100.0000 mg | ORAL_CAPSULE | Freq: Three times a day (TID) | ORAL | 0 refills | Status: DC | PRN

## 2020-11-12 NOTE — ED Provider Notes (Signed)
[facility] ____________________________________________  Time seen: Approximately 12:10 PM  I have reviewed the triage vital signs and the nursing notes.   HISTORY  Chief Complaint Cough   HPI Kristin Miller is a 85 y.o. female past medical history of hypertension and osteopenia presenting for evaluation of runny nose, nasal congestion and some cough.  Patient reports symptoms for started Friday night into Saturday.  States currently feels better than she did.  Has had some mild to moderate runny nose and nasal congestion.  Did have a dry cough over the weekend, but reports not coughing as much today.  Has not been taking over-the-counter medication on a regular basis.  Denies fevers.  Denies chest pain, shortness of breath, vomiting, diarrhea or other accompanying symptoms.  Does report her husband tested positive for COVID-19 yesterday.  Has had COVID-19 vaccines, but has not had the boosters.  Juline Patch, MD : PCP    Past Medical History:  Diagnosis Date  . Allergy   . Anemia    H/O AS A CHILD  . Arthritis   . Back pain   . Breast cancer of upper-outer quadrant of right female breast ([place]) 06/12/2015   4 millimeter, T1a, N0; ER 90%, PR 0%, HER-2/neu not overexpressed.  Declined radiation therapy.  . Collar bone fracture 05/28/2013   Fractured Collar bone on left  . Dysrhythmia    h/o heart skipping beat years ago  . Frequent headaches    h/o migraines  . Hypertension   . Osteopenia 04/2018   Bone density without significant interval change since 2017.    Patient Active Problem List   Diagnosis Date Noted  . Contusion of thigh 08/02/2018  . BP (high blood pressure) 11/15/2015  . OP (osteoporosis) 11/15/2015  . Primary cancer of lower-outer quadrant of right breast ([place]) 06/20/2015  . Medicare annual wellness visit, subsequent 10/09/2014  . Low back pain 08/02/2014  . Establishing care with new doctor, encounter for 08/02/2014  . HTN  (hypertension) 08/02/2014  . Basal cell carcinoma in situ of skin of left shoulder 07/31/2014  . Microcalcifications of the breast 11/28/2013  . Abnormal finding on mammography 10/03/2013  . Skin cancer 10/03/2013  . Mammographic microcalcification 05/04/2013  . Encounter for gynecological examination without abnormal finding 04/06/2013  . Chronic bilateral low back pain without sciatica 03/29/2012    Past Surgical History:  Procedure Laterality Date  . ABDOMINAL HYSTERECTOMY  1970   Partial  . BREAST BIOPSY Right 05/28/2015   INVASIVE MAMMARY CARCINOMA   . BREAST BIOPSY Right 06/12/2015   Wide excision, sentinel lymph node biopsy.  Marland Kitchen BREAST LUMPECTOMY WITH AXILLARY LYMPH NODE DISSECTION Right 06/12/2015   4 mm, T1a,N0 withDCIS with clear margins. ER: 90%, PR 0%; Her 2 neu not overexpressed.   Marland Kitchen CATARACT EXTRACTION W/PHACO Left 02/03/2017   Procedure: CATARACT EXTRACTION PHACO AND INTRAOCULAR LENS PLACEMENT (IOC);  Surgeon: Birder Robson, MD;  Location: [facility];  Service: Ophthalmology;  Laterality: Left;  [place]  00:28 AP% 16.3 CDE 4.68 Fluid pack lot # [id number]  . CATARACT EXTRACTION W/PHACO Right 03/01/2019   Procedure: CATARACT EXTRACTION PHACO AND INTRAOCULAR LENS PLACEMENT ([place])  RIGHT;  Surgeon: Birder Robson, MD;  Location: [place];  Service: Ophthalmology;  Laterality: Right;  . COLONOSCOPY       No current facility-administered medications for this encounter.  Current Outpatient Medications:  .  acetaminophen (TYLENOL) 500 MG tablet, Take 500 mg by mouth every 6 (six) hours as needed for pain.,  Disp: , Rfl:  .  benzonatate (TESSALON PERLES) 100 MG capsule, Take 1 capsule (100 mg total) by mouth 3 (three) times daily as needed., Disp: 20 capsule, Rfl: 0 .  Calcium-Vitamin D (CALTRATE 600 PLUS-VIT D PO), Take 2 tablets by mouth daily., Disp: , Rfl:  .  COMBIGAN 0.2-0.5 % ophthalmic solution, Place 1 drop into both eyes in the morning and at bedtime., Disp: ,  Rfl:  .  losartan-hydrochlorothiazide (HYZAAR) 100-12.5 MG tablet, Take 1 tablet by mouth daily., Disp: 90 tablet, Rfl: 1 .  Multiple Vitamins-Minerals (CENTRUM SILVER ULTRA WOMENS PO), Take 1 tablet by mouth daily., Disp: , Rfl:  .  letrozole (FEMARA) 2.5 MG tablet, Take 1 tablet (2.5 mg total) by mouth daily., Disp: 90 tablet, Rfl: 3 .  meloxicam (MOBIC) 7.5 MG tablet, Take 1 tablet (7.5 mg total) by mouth daily. (Patient not taking: No sig reported), Disp: 30 tablet, Rfl: 1  Allergies Lisinopril and Other  Family History  Problem Relation Age of Onset  . Mental illness Mother        Dementia  . Stroke Father   . Cancer Brother        Lung Cancer  . Breast cancer Other     Social History Social History   Tobacco Use  . Smoking status: Never Smoker  . Smokeless tobacco: Never Used  . Tobacco comment: smoking cessation materials not required  Vaping Use  . Vaping Use: Never used  Substance Use Topics  . Alcohol use: No  . Drug use: No    Review of Systems Constitutional: No fever ENT: No sore throat. As above.  Cardiovascular: Denies chest pain. Respiratory: Denies shortness of breath. Gastrointestinal: No abdominal pain.  No nausea, no vomiting.  No diarrhea.   Musculoskeletal: Negative for back pain. Skin: Negative for rash.   ____________________________________________   PHYSICAL EXAM:  VITAL SIGNS: ED Triage Vitals  Enc Vitals Group     BP 11/12/20 1136 (!) 155/94     Pulse Rate 11/12/20 1136 98     Resp 11/12/20 1136 20     Temp 11/12/20 1136 99.1 F (37.3 C)     Temp Source 11/12/20 1136 Oral     SpO2 11/12/20 1136 96 %     Weight 11/12/20 1137 147 lb 14.9 oz (67.1 kg)     Height 11/12/20 1137 5' (1.524 m)     Head Circumference --      Peak Flow --      Pain Score 11/12/20 1136 5     Pain Loc --      Pain Edu? --      Excl. in [place]? --     Constitutional: Alert and oriented. Well appearing and in no acute distress. Eyes: Conjunctivae are  normal.  ENT      Head: Normocephalic and atraumatic.      Ears: Nontender, normal canal, no erythema, normal TMs bilaterally.      Nose: Mild nasal congestion.      Mouth/Throat: Mucous membranes are moist.Oropharynx non-erythematous.  No tonsillar swelling or exudate. Neck: No stridor. Supple without meningismus.  Hematological/Lymphatic/Immunilogical: No cervical lymphadenopathy. Cardiovascular: Normal rate, regular rhythm. Grossly normal heart sounds.  Good peripheral circulation. Respiratory: Normal respiratory effort without tachypnea nor retractions. Breath sounds are clear and equal bilaterally. No wheezes, rales, rhonchi. Musculoskeletal:  Steady gait. Neurologic:  Normal speech and language.  Skin:  Skin is warm, dry and intact. No rash noted. Psychiatric: Mood and affect are normal. Speech  and behavior are normal. Patient exhibits appropriate insight and judgment   ___________________________________________   LABS (all labs ordered are listed, but only abnormal results are displayed)  Labs Reviewed  SARS CORONAVIRUS 2 (TAT 6-24 HRS)   ____________________________________________  PROCEDURES Procedures    INITIAL IMPRESSION / ASSESSMENT AND PLAN / ED COURSE  Pertinent labs & imaging results that were available during my care of the patient were reviewed by me and considered in my medical decision making (see chart for details).  Is a well-appearing patient.  No acute distress.  Suspect viral illness, concerning for COVID-19 due to direct exposure.  Lungs clear throughout.  Mild nasal congestion.  Exam otherwise unremarkable.  Patient states she is feeling better.  Patient asked for cough medication in case she needs it, as needed Tessalon Perles.  COVID-19 testing ordered.  Discussed strict follow-up and return parameters, including proceed erectly to emergency room for chest pain, shortness of breath or worsening complaints.  Encourage patient to follow-up with primary  care remotely if COVID-19 is positive and have regular monitoring.  Discussed follow up with Primary care physician this week. Discussed follow up and return parameters including no resolution or any worsening concerns. Patient verbalized understanding and agreed to plan.   ____________________________________________   FINAL CLINICAL IMPRESSION(S) / ED DIAGNOSES  Final diagnoses:  Nasal congestion  Viral illness  Close exposure to COVID-19 virus     ED Discharge Orders         Ordered    benzonatate (TESSALON PERLES) 100 MG capsule  3 times daily PRN        11/12/20 1201           Note: This dictation was prepared with Dragon dictation along with smaller phrase technology. Any transcriptional errors that result from this process are unintentional.         Marylene Land, NP 11/12/20 1214

## 2020-11-12 NOTE — Discharge Instructions (Signed)
Take medication as prescribed. Rest. Drink plenty of fluids. Monitor. Awaiting test results.   Follow up with your primary care physician this week. Return to Emergency room for chest pain, shortness of breath, weakness, or worsening concerns.

## 2020-11-12 NOTE — ED Triage Notes (Signed)
Pt c/o cough, nasal congestion. Started about 4 days ago. Husband recently tested positive for covid.

## 2020-11-13 ENCOUNTER — Telehealth: Payer: Self-pay | Admitting: Adult Health

## 2020-11-13 NOTE — Telephone Encounter (Signed)
Called to discuss with patient about COVID-19 symptoms and the use of one of the available treatments for those with mild to moderate Covid symptoms and at a high risk of hospitalization.  Pt appears to qualify for outpatient treatment due to co-morbid conditions and/or a member of an at-risk group in accordance with the FDA Emergency Use Authorization.    Symptom onset: 4/23 Vaccinated: yes Booster? no Immunocompromised? no Qualifiers: 5 NIH Criteria: 1  Unable to reach pt - LMOM, Paxlovid candidate, GFR 3 months ago was [place]

## 2021-01-23 ENCOUNTER — Ambulatory Visit: Payer: Medicare HMO | Admitting: Family Medicine

## 2021-01-30 ENCOUNTER — Ambulatory Visit (INDEPENDENT_AMBULATORY_CARE_PROVIDER_SITE_OTHER): Payer: Medicare HMO | Admitting: Family Medicine

## 2021-01-30 ENCOUNTER — Encounter: Payer: Self-pay | Admitting: Family Medicine

## 2021-01-30 ENCOUNTER — Other Ambulatory Visit: Payer: Self-pay

## 2021-01-30 DIAGNOSIS — I10 Essential (primary) hypertension: Secondary | ICD-10-CM | POA: Diagnosis not present

## 2021-01-30 MED ORDER — LOSARTAN POTASSIUM-HCTZ 100-12.5 MG PO TABS: 1.0000 | ORAL_TABLET | Freq: Every day | ORAL | 1 refills | Status: DC

## 2021-01-30 NOTE — Progress Notes (Signed)
Date:  01/30/2021   Name:  Kristin Miller   DOB:  [birth year]   MRN:  [id number]   Chief Complaint: Hypertension  Hypertension This is a chronic problem. The current episode started more than 1 year ago. The problem has been gradually improving since onset. The problem is controlled. Pertinent negatives include no chest pain, headaches, neck pain, palpitations, PND or shortness of breath. There are no associated agents to hypertension. Past treatments include angiotensin blockers and diuretics. The current treatment provides moderate improvement. There are no compliance problems.  There is no history of angina, kidney disease, CAD/MI, CVA, heart failure, left ventricular hypertrophy, PVD or retinopathy. There is no history of chronic renal disease, a hypertension causing med or renovascular disease.   Lab Results  Component Value Date   CREATININE 0.76 08/07/2020   BUN 13 08/07/2020   NA 140 08/07/2020   K 4.5 08/07/2020   CL 102 08/07/2020   CO2 27 08/07/2020   Lab Results  Component Value Date   CHOL 189 08/07/2020   HDL 43 08/07/2020   LDLCALC 127 (H) 08/07/2020   TRIG 107 08/07/2020   CHOLHDL 3.8 01/18/2018   Lab Results  Component Value Date   TSH 2.91 10/09/2014   Lab Results  Component Value Date   HGBA1C 5.8 10/09/2014   Lab Results  Component Value Date   WBC 8.4 05/26/2016   HGB 13.4 05/26/2016   HCT 40.0 05/26/2016   MCV 87 05/26/2016   PLT 268 05/26/2016   Lab Results  Component Value Date   ALT 19 10/09/2014   AST 24 10/09/2014   ALKPHOS 50 10/09/2014   BILITOT 0.4 10/09/2014     Review of Systems  Constitutional:  Negative for chills and fever.  HENT:  Negative for drooling, ear discharge, ear pain and sore throat.   Respiratory:  Negative for cough, shortness of breath and wheezing.   Cardiovascular:  Negative for chest pain, palpitations, leg swelling and PND.  Gastrointestinal:  Negative for abdominal pain, blood in stool, constipation,  diarrhea and nausea.  Endocrine: Negative for polydipsia.  Genitourinary:  Negative for dysuria, frequency, hematuria and urgency.  Musculoskeletal:  Negative for back pain, myalgias and neck pain.  Skin:  Negative for rash.  Allergic/Immunologic: Negative for environmental allergies.  Neurological:  Negative for dizziness and headaches.  Hematological:  Does not bruise/bleed easily.  Psychiatric/Behavioral:  Negative for suicidal ideas. The patient is not nervous/anxious.    Patient Active Problem List   Diagnosis Date Noted   Contusion of thigh 08/02/2018   BP (high blood pressure) 11/15/2015   OP (osteoporosis) 11/15/2015   Primary cancer of lower-outer quadrant of right breast ([place]) 06/20/2015   Medicare annual wellness visit, subsequent 10/09/2014   Low back pain 08/02/2014   Establishing care with new doctor, encounter for 08/02/2014   HTN (hypertension) 08/02/2014   Basal cell carcinoma in situ of skin of left shoulder 07/31/2014   Microcalcifications of the breast 11/28/2013   Abnormal finding on mammography 10/03/2013   Skin cancer 10/03/2013   Mammographic microcalcification 05/04/2013   Encounter for gynecological examination without abnormal finding 04/06/2013   Chronic bilateral low back pain without sciatica 03/29/2012    Allergies  Allergen Reactions   Combigan [Brimonidine Tartrate-Timolol]    Lisinopril Cough   Other Rash    SUTURES-(PARTIAL HYSTERECTOMY)    Past Surgical History:  Procedure Laterality Date   ABDOMINAL HYSTERECTOMY  1970   Partial   BREAST BIOPSY Right 05/28/2015  INVASIVE MAMMARY CARCINOMA    BREAST BIOPSY Right 06/12/2015   Wide excision, sentinel lymph node biopsy.   BREAST LUMPECTOMY WITH AXILLARY LYMPH NODE DISSECTION Right 06/12/2015   4 mm, T1a,N0 withDCIS with clear margins. ER: 90%, PR 0%; Her 2 neu not overexpressed.    CATARACT EXTRACTION W/PHACO Left 02/03/2017   Procedure: CATARACT EXTRACTION PHACO AND INTRAOCULAR LENS  PLACEMENT (IOC);  Surgeon: Birder Robson, MD;  Location: [facility];  Service: Ophthalmology;  Laterality: Left;  [place]  00:28 AP% 16.3 CDE 4.68 Fluid pack lot # [id number]   CATARACT EXTRACTION W/PHACO Right 03/01/2019   Procedure: CATARACT EXTRACTION PHACO AND INTRAOCULAR LENS PLACEMENT ([place])  RIGHT;  Surgeon: Birder Robson, MD;  Location: [place];  Service: Ophthalmology;  Laterality: Right;   COLONOSCOPY      Social History   Tobacco Use   Smoking status: Never   Smokeless tobacco: Never   Tobacco comments:    smoking cessation materials not required  Vaping Use   Vaping Use: Never used  Substance Use Topics   Alcohol use: No   Drug use: No     Medication list has been reviewed and updated.  Current Meds  Medication Sig   acetaminophen (TYLENOL) 500 MG tablet Take 500 mg by mouth every 6 (six) hours as needed for pain.   Calcium-Vitamin D (CALTRATE 600 PLUS-VIT D PO) Take 2 tablets by mouth daily.   latanoprost (XALATAN) 0.005 % ophthalmic solution 1 drop at bedtime. Eye doctor   letrozole (FEMARA) 2.5 MG tablet Take 1 tablet (2.5 mg total) by mouth daily.   losartan-hydrochlorothiazide (HYZAAR) 100-12.5 MG tablet Take 1 tablet by mouth daily.   Multiple Vitamins-Minerals (CENTRUM SILVER ULTRA WOMENS PO) Take 1 tablet by mouth daily.    PHQ 2/9 Scores 01/30/2021 06/27/2020 02/08/2020 08/10/2019  PHQ - 2 Score 0 0 1 0  PHQ- 9 Score 0 - 1 3    GAD 7 : Generalized Anxiety Score 01/30/2021 02/08/2020 08/10/2019  Nervous, Anxious, on Edge 0 0 0  Control/stop worrying 0 0 0  Worry too much - different things 0 0 0  Trouble relaxing 0 0 0  Restless 0 0 0  Easily annoyed or irritable 0 0 0  Afraid - awful might happen 0 0 0  Total GAD 7 Score 0 0 0    BP Readings from Last 3 Encounters:  01/30/21 120/70  11/12/20 (!) 155/94  08/07/20 138/80    Physical Exam Vitals and nursing note reviewed.  Constitutional:      General: She is not in acute distress.     Appearance: She is not diaphoretic.  HENT:     Head: Normocephalic and atraumatic.     Right Ear: Tympanic membrane, ear canal and external ear normal.     Left Ear: Tympanic membrane, ear canal and external ear normal.     Nose: Nose normal. No congestion or rhinorrhea.  Eyes:     General:        Right eye: No discharge.        Left eye: No discharge.     Conjunctiva/sclera: Conjunctivae normal.     Pupils: Pupils are equal, round, and reactive to light.  Neck:     Thyroid: No thyromegaly.     Vascular: No JVD.  Cardiovascular:     Rate and Rhythm: Normal rate and regular rhythm.     Heart sounds: Normal heart sounds. No murmur heard.   No friction rub. No gallop.  Pulmonary:  Effort: Pulmonary effort is normal.     Breath sounds: Normal breath sounds. No wheezing or rhonchi.  Abdominal:     General: Bowel sounds are normal.     Palpations: Abdomen is soft. There is no mass.     Tenderness: There is no abdominal tenderness. There is no guarding.  Musculoskeletal:        General: Normal range of motion.     Cervical back: Normal range of motion and neck supple.  Lymphadenopathy:     Cervical: No cervical adenopathy.  Skin:    General: Skin is warm and dry.  Neurological:     Mental Status: She is alert.     Deep Tendon Reflexes: Reflexes are normal and symmetric.    Wt Readings from Last 3 Encounters:  01/30/21 142 lb (64.4 kg)  11/12/20 147 lb 14.9 oz (67.1 kg)  08/07/20 148 lb (67.1 kg)    BP 120/70   Pulse 72   Ht 5' (1.524 m)   Wt 142 lb (64.4 kg)   BMI 27.73 kg/m   Assessment and Plan:  1. Essential hypertension Chronic.  Controlled.  Stable.  Blood pressure today is 120/70.  We will continue losartan hydrochlorothiazide 100/12.5 mg once a day.  Review of previous renal function panel as well as lipids are acceptable and we will recheck in 6 months. - losartan-hydrochlorothiazide (HYZAAR) 100-12.5 MG tablet; Take 1 tablet by mouth daily.  Dispense: 90  tablet; Refill: 1

## 2021-04-11 DIAGNOSIS — H401131 Primary open-angle glaucoma, bilateral, mild stage: Secondary | ICD-10-CM | POA: Diagnosis not present

## 2021-05-01 ENCOUNTER — Other Ambulatory Visit: Payer: Self-pay | Admitting: General Surgery

## 2021-05-01 DIAGNOSIS — C50511 Malignant neoplasm of lower-outer quadrant of right female breast: Secondary | ICD-10-CM

## 2021-05-20 DIAGNOSIS — M545 Low back pain, unspecified: Secondary | ICD-10-CM | POA: Diagnosis not present

## 2021-05-22 ENCOUNTER — Other Ambulatory Visit: Payer: Self-pay

## 2021-05-22 ENCOUNTER — Ambulatory Visit
Admission: RE | Admit: 2021-05-22 | Discharge: 2021-05-22 | Disposition: A | Discharge status: Home / Self Care | Payer: Medicare HMO | Source: Ambulatory Visit | Attending: General Surgery | Admitting: General Surgery

## 2021-05-22 DIAGNOSIS — C50511 Malignant neoplasm of lower-outer quadrant of right female breast: Secondary | ICD-10-CM

## 2021-05-22 DIAGNOSIS — Z1231 Encounter for screening mammogram for malignant neoplasm of breast: Secondary | ICD-10-CM | POA: Diagnosis not present

## 2021-05-22 DIAGNOSIS — Z853 Personal history of malignant neoplasm of breast: Secondary | ICD-10-CM | POA: Diagnosis not present

## 2021-05-30 DIAGNOSIS — Z853 Personal history of malignant neoplasm of breast: Secondary | ICD-10-CM | POA: Diagnosis not present

## 2021-07-01 ENCOUNTER — Ambulatory Visit: Payer: Medicare HMO

## 2021-08-02 ENCOUNTER — Encounter: Payer: Self-pay | Admitting: Family Medicine

## 2021-08-02 ENCOUNTER — Ambulatory Visit (INDEPENDENT_AMBULATORY_CARE_PROVIDER_SITE_OTHER): Payer: Medicare HMO | Admitting: Family Medicine

## 2021-08-02 ENCOUNTER — Other Ambulatory Visit: Payer: Self-pay

## 2021-08-02 VITALS — BP 130/80 | HR 68 | Ht 60.0 in | Wt 145.0 lb

## 2021-08-02 DIAGNOSIS — Z23 Encounter for immunization: Secondary | ICD-10-CM | POA: Diagnosis not present

## 2021-08-02 DIAGNOSIS — J0100 Acute maxillary sinusitis, unspecified: Secondary | ICD-10-CM

## 2021-08-02 DIAGNOSIS — I10 Essential (primary) hypertension: Secondary | ICD-10-CM | POA: Diagnosis not present

## 2021-08-02 DIAGNOSIS — E7801 Familial hypercholesterolemia: Secondary | ICD-10-CM

## 2021-08-02 MED ORDER — TRIAMCINOLONE ACETONIDE 55 MCG/ACT NA AERO: 2.0000 | INHALATION_SPRAY | Freq: Every day | NASAL | 12 refills | Status: DC

## 2021-08-02 MED ORDER — AMOXICILLIN 500 MG PO CAPS: 500.0000 mg | ORAL_CAPSULE | Freq: Three times a day (TID) | ORAL | 0 refills | Status: AC

## 2021-08-02 MED ORDER — LOSARTAN POTASSIUM-HCTZ 100-12.5 MG PO TABS: 1.0000 | ORAL_TABLET | Freq: Every day | ORAL | 1 refills | Status: DC

## 2021-08-02 NOTE — Progress Notes (Signed)
Date:  08/02/2021   Name:  Kristin Miller   DOB:  [birth year]   MRN:  [id number]   Chief Complaint: Hypertension and Flu Vaccine  Hypertension This is a chronic problem. The current episode started more than 1 year ago. The problem has been gradually improving since onset. The problem is controlled. Pertinent negatives include no anxiety, blurred vision, chest pain, headaches, malaise/fatigue, neck pain, orthopnea, palpitations, peripheral edema, PND, shortness of breath or sweats. There are no associated agents to hypertension. Past treatments include angiotensin blockers and diuretics. There are no compliance problems.  There is no history of angina, kidney disease, CAD/MI, CVA, heart failure, left ventricular hypertrophy, PVD or retinopathy. There is no history of chronic renal disease, a hypertension causing med or renovascular disease.   Lab Results  Component Value Date   NA 140 08/07/2020   K 4.5 08/07/2020   CO2 27 08/07/2020   GLUCOSE 100 (H) 08/07/2020   BUN 13 08/07/2020   CREATININE 0.76 08/07/2020   CALCIUM 9.4 08/07/2020   GFRNONAA 73 08/07/2020   Lab Results  Component Value Date   CHOL 189 08/07/2020   HDL 43 08/07/2020   LDLCALC 127 (H) 08/07/2020   TRIG 107 08/07/2020   CHOLHDL 3.8 01/18/2018   Lab Results  Component Value Date   TSH 2.91 10/09/2014   Lab Results  Component Value Date   HGBA1C 5.8 10/09/2014   Lab Results  Component Value Date   WBC 8.4 05/26/2016   HGB 13.4 05/26/2016   HCT 40.0 05/26/2016   MCV 87 05/26/2016   PLT 268 05/26/2016   Lab Results  Component Value Date   ALT 19 10/09/2014   AST 24 10/09/2014   ALKPHOS 50 10/09/2014   BILITOT 0.4 10/09/2014   No results found for: 25OHVITD2, 25OHVITD3, VD25OH   Review of Systems  Constitutional:  Negative for activity change, chills, fever and malaise/fatigue.  HENT:  Negative for drooling, ear discharge, ear pain and sore throat.   Eyes:  Negative for blurred vision.   Respiratory:  Negative for cough, shortness of breath and wheezing.   Cardiovascular:  Negative for chest pain, palpitations, orthopnea, leg swelling and PND.  Gastrointestinal:  Negative for abdominal pain, blood in stool, constipation, diarrhea and nausea.  Endocrine: Negative for polydipsia.  Genitourinary:  Negative for dysuria, frequency, hematuria and urgency.  Musculoskeletal:  Negative for back pain, myalgias and neck pain.  Skin:  Negative for rash.  Allergic/Immunologic: Negative for environmental allergies.  Neurological:  Negative for dizziness and headaches.  Hematological:  Does not bruise/bleed easily.  Psychiatric/Behavioral:  Negative for suicidal ideas. The patient is not nervous/anxious.    Patient Active Problem List   Diagnosis Date Noted   Contusion of thigh 08/02/2018   BP (high blood pressure) 11/15/2015   OP (osteoporosis) 11/15/2015   Primary cancer of lower-outer quadrant of right breast ([place]) 06/20/2015   Medicare annual wellness visit, subsequent 10/09/2014   Low back pain 08/02/2014   Establishing care with new doctor, encounter for 08/02/2014   HTN (hypertension) 08/02/2014   Basal cell carcinoma in situ of skin of left shoulder 07/31/2014   Microcalcifications of the breast 11/28/2013   Abnormal finding on mammography 10/03/2013   Skin cancer 10/03/2013   Mammographic microcalcification 05/04/2013   Encounter for gynecological examination without abnormal finding 04/06/2013   Chronic bilateral low back pain without sciatica 03/29/2012    Allergies  Allergen Reactions   Combigan [Brimonidine Tartrate-Timolol]    Lisinopril Cough  Other Rash    SUTURES-(PARTIAL HYSTERECTOMY)    Past Surgical History:  Procedure Laterality Date   ABDOMINAL HYSTERECTOMY  1970   Partial   BREAST BIOPSY Right 05/28/2015   INVASIVE MAMMARY CARCINOMA    BREAST BIOPSY Right 06/12/2015   Wide excision, sentinel lymph node biopsy.   BREAST LUMPECTOMY WITH AXILLARY  LYMPH NODE DISSECTION Right 06/12/2015   4 mm, T1a,N0 withDCIS with clear margins. ER: 90%, PR 0%; Her 2 neu not overexpressed.    CATARACT EXTRACTION W/PHACO Left 02/03/2017   Procedure: CATARACT EXTRACTION PHACO AND INTRAOCULAR LENS PLACEMENT (IOC);  Surgeon: Birder Robson, MD;  Location: [facility];  Service: Ophthalmology;  Laterality: Left;  [place]  00:28 AP% 16.3 CDE 4.68 Fluid pack lot # [id number]   CATARACT EXTRACTION W/PHACO Right 03/01/2019   Procedure: CATARACT EXTRACTION PHACO AND INTRAOCULAR LENS PLACEMENT ([place])  RIGHT;  Surgeon: Birder Robson, MD;  Location: [place];  Service: Ophthalmology;  Laterality: Right;   COLONOSCOPY      Social History   Tobacco Use   Smoking status: Never   Smokeless tobacco: Never   Tobacco comments:    smoking cessation materials not required  Vaping Use   Vaping Use: Never used  Substance Use Topics   Alcohol use: No   Drug use: No     Medication list has been reviewed and updated.  Current Meds  Medication Sig   acetaminophen (TYLENOL) 500 MG tablet Take 500 mg by mouth every 6 (six) hours as needed for pain.   Calcium-Vitamin D (CALTRATE 600 PLUS-VIT D PO) Take 2 tablets by mouth daily.   latanoprost (XALATAN) 0.005 % ophthalmic solution 1 drop at bedtime. Eye doctor   losartan-hydrochlorothiazide (HYZAAR) 100-12.5 MG tablet Take 1 tablet by mouth daily.   meloxicam (MOBIC) 7.5 MG tablet Take 1 tablet (7.5 mg total) by mouth daily.   Multiple Vitamins-Minerals (CENTRUM SILVER ULTRA WOMENS PO) Take 1 tablet by mouth daily.   [DISCONTINUED] letrozole (FEMARA) 2.5 MG tablet Take 1 tablet (2.5 mg total) by mouth daily.    PHQ 2/9 Scores 08/02/2021 01/30/2021 06/27/2020 02/08/2020  PHQ - 2 Score 0 0 0 1  PHQ- 9 Score 0 0 - 1    GAD 7 : Generalized Anxiety Score 08/02/2021 01/30/2021 02/08/2020 08/10/2019  Nervous, Anxious, on Edge 0 0 0 0  Control/stop worrying 0 0 0 0  Worry too much - different things 0 0 0 0  Trouble  relaxing 0 0 0 0  Restless 0 0 0 0  Easily annoyed or irritable 0 0 0 0  Afraid - awful might happen 0 0 0 0  Total GAD 7 Score 0 0 0 0  Anxiety Difficulty Not difficult at all - - -    BP Readings from Last 3 Encounters:  08/02/21 130/80  01/30/21 120/70  11/12/20 (!) 155/94    Physical Exam Vitals and nursing note reviewed. Exam conducted with a chaperone present.  Constitutional:      General: She is not in acute distress.    Appearance: She is not diaphoretic.  HENT:     Head: Normocephalic and atraumatic.     Right Ear: Tympanic membrane and external ear normal. There is no impacted cerumen.     Left Ear: Tympanic membrane and external ear normal. There is no impacted cerumen.     Nose: Nose normal.     Right Turbinates: Swollen.     Left Turbinates: Swollen.     Right Sinus: No maxillary  sinus tenderness or frontal sinus tenderness.     Left Sinus: No maxillary sinus tenderness or frontal sinus tenderness.     Mouth/Throat:     Pharynx: Oropharynx is clear. Uvula midline. No posterior oropharyngeal erythema.  Eyes:     General:        Right eye: No discharge.        Left eye: No discharge.     Conjunctiva/sclera: Conjunctivae normal.     Pupils: Pupils are equal, round, and reactive to light.  Neck:     Thyroid: No thyromegaly.     Vascular: No JVD.  Cardiovascular:     Rate and Rhythm: Normal rate and regular rhythm.     Heart sounds: Normal heart sounds. No murmur heard.   No friction rub. No gallop.  Pulmonary:     Effort: Pulmonary effort is normal.     Breath sounds: Normal breath sounds.  Abdominal:     General: Bowel sounds are normal.     Palpations: Abdomen is soft. There is no mass.     Tenderness: There is no abdominal tenderness. There is no guarding.  Musculoskeletal:        General: Normal range of motion.     Cervical back: Normal range of motion and neck supple.  Lymphadenopathy:     Cervical: No cervical adenopathy.  Skin:    General:  Skin is warm and dry.  Neurological:     Mental Status: She is alert.     Deep Tendon Reflexes: Reflexes are normal and symmetric.    Wt Readings from Last 3 Encounters:  08/02/21 145 lb (65.8 kg)  01/30/21 142 lb (64.4 kg)  11/12/20 147 lb 14.9 oz (67.1 kg)    BP 130/80    Pulse 68    Ht 5' (1.524 m)    Wt 145 lb (65.8 kg)    BMI 28.32 kg/m   Assessment and Plan:  1. Essential hypertension Chronic.  Controlled.  Stable.  Blood pressure today is 130/80.  Continue losartan hydrochlorothiazide 100-12.5 mg once a day.  Will check CMP for electrolytes and GFR status. - losartan-hydrochlorothiazide (HYZAAR) 100-12.5 MG tablet; Take 1 tablet by mouth daily.  Dispense: 90 tablet; Refill: 1 - Comprehensive Metabolic Panel (CMET)  2. Familial hypercholesterolemia Chronic.  Controlled by diet.  We will recheck lipid panel although she is not fasting today and take that in consideration.  We will provide low-cholesterol low triglyceride guidelines for dietary approach. - Lipid Panel With LDL/HDL Ratio  3. Acute maxillary sinusitis, recurrence not specified New onset.  Persistent.  Patient has episodic sinus discomfort this in the sphenoid ethmoid.  Likely by her history.  We will treat with amoxicillin 500 mg 3 times a day for 10 days patient has been prescribed Nasacort 2 sprays in each nostril daily. - triamcinolone (NASACORT) 55 MCG/ACT AERO nasal inhaler; Place 2 sprays into the nose daily.  Dispense: 1 each; Refill: 12 - amoxicillin (AMOXIL) 500 MG capsule; Take 1 capsule (500 mg total) by mouth 3 (three) times daily for 10 days.  Dispense: 30 capsule; Refill: 0  4. Need for immunization against influenza Discussed and administered - Flu Vaccine QUAD High Dose(Fluad)

## 2021-08-02 NOTE — Patient Instructions (Signed)

## 2021-08-03 LAB — COMPREHENSIVE METABOLIC PANEL
ALT: 14 IU/L (ref 0–32)
AST: 22 IU/L (ref 0–40)
Albumin/Globulin Ratio: 1.8 (ref 1.2–2.2)
Albumin: 4.2 g/dL (ref 3.6–4.6)
Alkaline Phosphatase: 85 IU/L (ref 44–121)
BUN/Creatinine Ratio: 26 (ref 12–28)
BUN: 18 mg/dL (ref 8–27)
Bilirubin Total: 0.2 mg/dL (ref 0.0–1.2)
CO2: 23 mmol/L (ref 20–29)
Calcium: 9.7 mg/dL (ref 8.7–10.3)
Chloride: 103 mmol/L (ref 96–106)
Creatinine, Ser: 0.7 mg/dL (ref 0.57–1.00)
Globulin, Total: 2.4 g/dL (ref 1.5–4.5)
Glucose: 100 mg/dL — ABNORMAL HIGH (ref 70–99)
Potassium: 4 mmol/L (ref 3.5–5.2)
Sodium: 140 mmol/L (ref 134–144)
Total Protein: 6.6 g/dL (ref 6.0–8.5)
eGFR: 86 mL/min/{1.73_m2} (ref >59)

## 2021-08-03 LAB — LIPID PANEL WITH LDL/HDL RATIO
Cholesterol, Total: 178 mg/dL (ref 100–199)
HDL: 50 mg/dL (ref >39)
LDL Chol Calc (NIH): 114 mg/dL — ABNORMAL HIGH (ref 0–99)
LDL/HDL Ratio: 2.3 ratio (ref 0.0–3.2)
Triglycerides: 77 mg/dL (ref 0–149)
VLDL Cholesterol Cal: 14 mg/dL (ref 5–40)

## 2021-08-23 ENCOUNTER — Other Ambulatory Visit: Payer: Self-pay | Admitting: Physical Medicine & Rehabilitation

## 2021-08-23 DIAGNOSIS — M5442 Lumbago with sciatica, left side: Secondary | ICD-10-CM | POA: Diagnosis not present

## 2021-08-23 DIAGNOSIS — G8929 Other chronic pain: Secondary | ICD-10-CM

## 2021-08-27 ENCOUNTER — Other Ambulatory Visit: Payer: Self-pay

## 2021-08-27 ENCOUNTER — Ambulatory Visit
Admission: RE | Admit: 2021-08-27 | Discharge: 2021-08-27 | Disposition: A | Discharge status: Home / Self Care | Payer: Medicare HMO | Source: Ambulatory Visit | Attending: Physical Medicine & Rehabilitation | Admitting: Physical Medicine & Rehabilitation

## 2021-08-27 DIAGNOSIS — G8929 Other chronic pain: Secondary | ICD-10-CM | POA: Insufficient documentation

## 2021-08-27 DIAGNOSIS — M5136 Other intervertebral disc degeneration, lumbar region: Secondary | ICD-10-CM | POA: Diagnosis not present

## 2021-08-27 DIAGNOSIS — M5441 Lumbago with sciatica, right side: Secondary | ICD-10-CM | POA: Diagnosis not present

## 2021-08-27 DIAGNOSIS — M5126 Other intervertebral disc displacement, lumbar region: Secondary | ICD-10-CM | POA: Diagnosis not present

## 2021-08-27 DIAGNOSIS — M48061 Spinal stenosis, lumbar region without neurogenic claudication: Secondary | ICD-10-CM | POA: Diagnosis not present

## 2021-08-27 DIAGNOSIS — M5442 Lumbago with sciatica, left side: Secondary | ICD-10-CM | POA: Insufficient documentation

## 2021-09-02 DIAGNOSIS — M48062 Spinal stenosis, lumbar region with neurogenic claudication: Secondary | ICD-10-CM | POA: Diagnosis not present

## 2021-09-02 DIAGNOSIS — M5442 Lumbago with sciatica, left side: Secondary | ICD-10-CM | POA: Diagnosis not present

## 2021-09-02 DIAGNOSIS — G8929 Other chronic pain: Secondary | ICD-10-CM | POA: Diagnosis not present

## 2021-09-09 DIAGNOSIS — M48062 Spinal stenosis, lumbar region with neurogenic claudication: Secondary | ICD-10-CM | POA: Diagnosis not present

## 2021-09-23 DIAGNOSIS — G8929 Other chronic pain: Secondary | ICD-10-CM | POA: Diagnosis not present

## 2021-09-23 DIAGNOSIS — M5442 Lumbago with sciatica, left side: Secondary | ICD-10-CM | POA: Diagnosis not present

## 2021-09-23 DIAGNOSIS — M48062 Spinal stenosis, lumbar region with neurogenic claudication: Secondary | ICD-10-CM | POA: Diagnosis not present

## 2021-09-25 ENCOUNTER — Ambulatory Visit (INDEPENDENT_AMBULATORY_CARE_PROVIDER_SITE_OTHER): Payer: Medicare HMO

## 2021-09-25 DIAGNOSIS — Z0000 Encounter for general adult medical examination without abnormal findings: Secondary | ICD-10-CM | POA: Diagnosis not present

## 2021-09-25 NOTE — Patient Instructions (Signed)
Kristin Miller , Thank you for taking time to come for your Medicare Wellness Visit. I appreciate your ongoing commitment to your health goals. Please review the following plan we discussed and let me know if I can assist you in the future.   Screening recommendations/referrals: Colonoscopy: no longer required Mammogram: done 05/22/21 Bone Density: done 05/15/20 Recommended yearly ophthalmology/optometry visit for glaucoma screening and checkup Recommended yearly dental visit for hygiene and checkup  Vaccinations: Influenza vaccine: done 08/02/21 Pneumococcal vaccine: done 02/12/17 Tdap vaccine: due Shingles vaccine: Shingrix discussed. Please contact your pharmacy for coverage information.  Covid-19:done 02/08/20 & 03/10/20  Advanced directives: Please bring a copy of your health care power of attorney and living will to the office at your convenience.   Conditions/risks identified: Recommend continuing fall prevention in the home  Next appointment: Follow up in one year for your annual wellness visit    Preventive Care 65 Years and Older, Female Preventive care refers to lifestyle choices and visits with your health care provider that can promote health and wellness. What does preventive care include? A yearly physical exam. This is also called an annual well check. Dental exams once or twice a year. Routine eye exams. Ask your health care provider how often you should have your eyes checked. Personal lifestyle choices, including: Daily care of your teeth and gums. Regular physical activity. Eating a healthy diet. Avoiding tobacco and drug use. Limiting alcohol use. Practicing safe sex. Taking low-dose aspirin every day. Taking vitamin and mineral supplements as recommended by your health care provider. What happens during an annual well check? The services and screenings done by your health care provider during your annual well check will depend on your age, overall health,  lifestyle risk factors, and family history of disease. Counseling  Your health care provider may ask you questions about your: Alcohol use. Tobacco use. Drug use. Emotional well-being. Home and relationship well-being. Sexual activity. Eating habits. History of falls. Memory and ability to understand (cognition). Work and work Statistician. Reproductive health. Screening  You may have the following tests or measurements: Height, weight, and BMI. Blood pressure. Lipid and cholesterol levels. These may be checked every 5 years, or more frequently if you are over 46 years old. Skin check. Lung cancer screening. You may have this screening every year starting at age 73 if you have a 30-pack-year history of smoking and currently smoke or have quit within the past 15 years. Fecal occult blood test (FOBT) of the stool. You may have this test every year starting at age 50. Flexible sigmoidoscopy or colonoscopy. You may have a sigmoidoscopy every 5 years or a colonoscopy every 10 years starting at age 74. Hepatitis C blood test. Hepatitis B blood test. Sexually transmitted disease (STD) testing. Diabetes screening. This is done by checking your blood sugar (glucose) after you have not eaten for a while (fasting). You may have this done every 1-3 years. Bone density scan. This is done to screen for osteoporosis. You may have this done starting at age 57. Mammogram. This may be done every 1-2 years. Talk to your health care provider about how often you should have regular mammograms. Talk with your health care provider about your test results, treatment options, and if necessary, the need for more tests. Vaccines  Your health care provider may recommend certain vaccines, such as: Influenza vaccine. This is recommended every year. Tetanus, diphtheria, and acellular pertussis (Tdap, Td) vaccine. You may need a Td booster every 10 years. Zoster vaccine.  You may need this after age  36. Pneumococcal 13-valent conjugate (PCV13) vaccine. One dose is recommended after age 33. Pneumococcal polysaccharide (PPSV23) vaccine. One dose is recommended after age 57. Talk to your health care provider about which screenings and vaccines you need and how often you need them. This information is not intended to replace advice given to you by your health care provider. Make sure you discuss any questions you have with your health care provider. Document Released: 08/03/2015 Document Revised: 03/26/2016 Document Reviewed: 05/08/2015 Elsevier Interactive Patient Education  2017 [employer] Prevention in the Home Falls can cause injuries. They can happen to people of all ages. There are many things you can do to make your home safe and to help prevent falls. What can I do on the outside of my home? Regularly fix the edges of walkways and driveways and fix any cracks. Remove anything that might make you trip as you walk through a door, such as a raised step or threshold. Trim any bushes or trees on the path to your home. Use bright outdoor lighting. Clear any walking paths of anything that might make someone trip, such as rocks or tools. Regularly check to see if handrails are loose or broken. Make sure that both sides of any steps have handrails. Any raised decks and porches should have guardrails on the edges. Have any leaves, snow, or ice cleared regularly. Use sand or salt on walking paths during winter. Clean up any spills in your garage right away. This includes oil or grease spills. What can I do in the bathroom? Use night lights. Install grab bars by the toilet and in the tub and shower. Do not use towel bars as grab bars. Use non-skid mats or decals in the tub or shower. If you need to sit down in the shower, use a plastic, non-slip stool. Keep the floor dry. Clean up any water that spills on the floor as soon as it happens. Remove soap buildup in the tub or shower  regularly. Attach bath mats securely with double-sided non-slip rug tape. Do not have throw rugs and other things on the floor that can make you trip. What can I do in the bedroom? Use night lights. Make sure that you have a light by your bed that is easy to reach. Do not use any sheets or blankets that are too big for your bed. They should not hang down onto the floor. Have a firm chair that has side arms. You can use this for support while you get dressed. Do not have throw rugs and other things on the floor that can make you trip. What can I do in the kitchen? Clean up any spills right away. Avoid walking on wet floors. Keep items that you use a lot in easy-to-reach places. If you need to reach something above you, use a strong step stool that has a grab bar. Keep electrical cords out of the way. Do not use floor polish or wax that makes floors slippery. If you must use wax, use non-skid floor wax. Do not have throw rugs and other things on the floor that can make you trip. What can I do with my stairs? Do not leave any items on the stairs. Make sure that there are handrails on both sides of the stairs and use them. Fix handrails that are broken or loose. Make sure that handrails are as long as the stairways. Check any carpeting to make sure that it is  firmly attached to the stairs. Fix any carpet that is loose or worn. Avoid having throw rugs at the top or bottom of the stairs. If you do have throw rugs, attach them to the floor with carpet tape. Make sure that you have a light switch at the top of the stairs and the bottom of the stairs. If you do not have them, ask someone to add them for you. What else can I do to help prevent falls? Wear shoes that: Do not have high heels. Have rubber bottoms. Are comfortable and fit you well. Are closed at the toe. Do not wear sandals. If you use a stepladder: Make sure that it is fully opened. Do not climb a closed stepladder. Make sure that  both sides of the stepladder are locked into place. Ask someone to hold it for you, if possible. Clearly mark and make sure that you can see: Any grab bars or handrails. First and last steps. Where the edge of each step is. Use tools that help you move around (mobility aids) if they are needed. These include: Canes. Walkers. Scooters. Crutches. Turn on the lights when you go into a dark area. Replace any light bulbs as soon as they burn out. Set up your furniture so you have a clear path. Avoid moving your furniture around. If any of your floors are uneven, fix them. If there are any pets around you, be aware of where they are. Review your medicines with your doctor. Some medicines can make you feel dizzy. This can increase your chance of falling. Ask your doctor what other things that you can do to help prevent falls. This information is not intended to replace advice given to you by your health care provider. Make sure you discuss any questions you have with your health care provider. Document Released: 05/03/2009 Document Revised: 12/13/2015 Document Reviewed: 08/11/2014 Elsevier Interactive Patient Education  2017 [employer].

## 2021-09-25 NOTE — Progress Notes (Signed)
Subjective:   Kristin Miller is a 86 y.o. female who presents for Medicare Annual (Subsequent) preventive examination.  Virtual Visit via Telephone Note  I connected with  Kristin Miller on 09/25/21 at  8:00 AM EST by telephone and verified that I am speaking with the correct person using two identifiers.  Location: Patient: home Provider: [facility] Persons participating in the virtual visit: Kristin Miller   I discussed the limitations, risks, security and privacy concerns of performing an evaluation and management service by telephone and the availability of in person appointments. The patient expressed understanding and agreed to proceed.  Interactive audio and video telecommunications were attempted between this nurse and patient, however failed, due to patient having technical difficulties OR patient did not have access to video capability.  We continued and completed visit with audio only.  Some vital signs may be absent or patient reported.   Clemetine Marker, LPN   Review of Systems     Cardiac Risk Factors include: advanced age (>58mn, >>52women);hypertension     Objective:    Today's Vitals   09/25/21 [id number] PainSc: 4    There is no height or weight on file to calculate BMI.  Advanced Directives 09/25/2021 11/12/2020 06/27/2020 03/01/2019 08/02/2018 11/23/2017 07/30/2017  Does Patient Have a Medical Advance Directive? Yes No No No No No No  Type of AParamedicof AVan BurenLiving will - - - - - -  Copy of HW[place]ain Chart? No - copy requested - - - - - -  Would patient like information on creating a medical advance directive? - - Yes (MAU/Ambulatory/Procedural Areas - Information given) No - Patient declined Yes (MAU/Ambulatory/Procedural Areas - Information given) No - Patient declined Yes (MAU/Ambulatory/Procedural Areas - Information given)    Current Medications (verified) Outpatient Encounter Medications as of  09/25/2021  Medication Sig   acetaminophen (TYLENOL) 500 MG tablet Take 500 mg by mouth every 6 (six) hours as needed for pain.   Calcium-Vitamin D (CALTRATE 600 PLUS-VIT D PO) Take 2 tablets by mouth daily.   latanoprost (XALATAN) 0.005 % ophthalmic solution 1 drop at bedtime. Eye doctor   losartan-hydrochlorothiazide (HYZAAR) 100-12.5 MG tablet Take 1 tablet by mouth daily.   Multiple Vitamins-Minerals (CENTRUM SILVER ULTRA WOMENS PO) Take 1 tablet by mouth daily.   predniSONE (DELTASONE) 10 MG tablet Take by mouth. Take as directed   triamcinolone (NASACORT) 55 MCG/ACT AERO nasal inhaler Place 2 sprays into the nose daily.   [DISCONTINUED] meloxicam (MOBIC) 7.5 MG tablet Take 1 tablet (7.5 mg total) by mouth daily.   No facility-administered encounter medications on file as of 09/25/2021.    Allergies (verified) Combigan [brimonidine tartrate-timolol], Lisinopril, and Other   History: Past Medical History:  Diagnosis Date   Allergy    Anemia    H/O AS A CHILD   Arthritis    Back pain    [place] of [place]outer [place]   4 millimeter, T1a, N0; ER 90%, PR 0%, HER-2/neu not overexpressed.  Declined radiation therapy.   Collar bone fracture 05/28/2013   Fractured Collar bone on left   Dysrhythmia    h/o heart skipping beat years ago   Frequent headaches    h/o migraines   Hypertension    Osteopenia 04/2018   Bone density without significant interval change since 2017.   Past Surgical History:  Procedure Laterality Date   ABDOMINAL HYSTERECTOMY  1970  Partial   BREAST BIOPSY Right 05/28/2015   INVASIVE MAMMARY CARCINOMA    BREAST BIOPSY Right 06/12/2015   Wide excision, sentinel lymph node biopsy.   BREAST LUMPECTOMY WITH AXILLARY LYMPH NODE DISSECTION Right 06/12/2015   4 mm, T1a,N0 withDCIS with clear margins. ER: 90%, PR 0%; Her 2 neu not overexpressed.    CATARACT EXTRACTION W/PHACO Left 02/03/2017   Procedure: CATARACT  EXTRACTION PHACO AND INTRAOCULAR LENS PLACEMENT (IOC);  Surgeon: Birder Robson, MD;  Location: [facility];  Service: Ophthalmology;  Laterality: Left;  [place]  00:28 AP% 16.3 CDE 4.68 Fluid pack lot # [id number]   CATARACT EXTRACTION W/PHACO Right 03/01/2019   Procedure: CATARACT EXTRACTION PHACO AND INTRAOCULAR LENS PLACEMENT ([place])  RIGHT;  Surgeon: Birder Robson, MD;  Location: [facility];  Service: Ophthalmology;  Laterality: Right;   COLONOSCOPY     Family History  Problem Relation Age of Onset   Mental illness Mother        Dementia   Stroke Father    Cancer Brother        Lung Cancer   Breast cancer Other    Social History   Socioeconomic History   Marital status: Married    Spouse name: Not on file   Number of children: 3   Years of education: GED   Highest education level: 12th grade  Occupational History   Occupation: Retired  Tobacco Use   Smoking status: Never   Smokeless tobacco: Never   Tobacco comments:    smoking cessation materials not required  Vaping Use   Vaping Use: Never used  Substance and Sexual Activity   Alcohol use: No   Drug use: No   Sexual activity: Not Currently  Other Topics Concern   Not on file  Social History Narrative   Not on file   Social Determinants of Health   Financial Resource Strain: Low Risk    Difficulty of Paying Living Expenses: Not hard at all  Food Insecurity: No Food Insecurity   Worried About Charity fundraiser in the Last Year: Never true   [place] in the Last Year: Never true  Transportation Needs: No Transportation Needs   Lack of Transportation (Medical): No   Lack of Transportation (Non-Medical): No  Physical Activity: Inactive   Days of Exercise per Week: 0 days   Minutes of Exercise per Session: 0 min  Stress: No Stress Concern Present   Feeling of Stress : Not at all  Social Connections: Moderately Integrated   Frequency of Communication with Friends and Family: More than three times  a week   Frequency of Social Gatherings with Friends and Family: More than three times a week   Attends Religious Services: More than 4 times per year   Active Member of [employer] or Organizations: No   Attends Music therapist: Never   Marital Status: Married    Tobacco Counseling Counseling given: Not Answered Tobacco comments: smoking cessation materials not required   Clinical Intake:  Pre-visit preparation completed: Yes  Pain : 0-10 Pain Score: 4  Pain Type: Chronic pain Pain Orientation: Mid Pain Descriptors / Indicators: Aching, Sore Pain Onset: More than a month ago Pain Frequency: Constant     Nutritional Risks: None Diabetes: No  How often do you need to have someone help you when you read instructions, pamphlets, or other written materials from your doctor or pharmacy?: 1 - Never    Interpreter Needed?: No  Information entered by ::  Clemetine Marker LPN   Activities of Daily Living In your present state of health, do you have any difficulty performing the following activities: 09/25/2021 08/02/2021  Hearing? N N  Vision? N N  Difficulty concentrating or making decisions? N N  Walking or climbing stairs? Y N  Dressing or bathing? N N  Doing errands, shopping? N N  Preparing Food and eating ? N -  Using the Toilet? N -  In the past six months, have you accidently leaked urine? N -  Do you have problems with loss of bowel control? N -  Managing your Medications? N -  Managing your Finances? N -  Housekeeping or managing your Housekeeping? N -  Some recent data might be hidden    Patient Care Team: Juline Patch, MD as PCP - General (Family Medicine) Bary Castilla, Forest Gleason, MD (General Surgery)  Indicate any recent Medical Services you may have received from other than [facility] providers in the past year (date may be approximate).     Assessment:   This is a routine wellness examination for Kristin Miller.  Hearing/Vision screen Hearing Screening -  Comments:: Pt wears hearing aids  Vision Screening - Comments:: [facility]   Dietary issues and exercise activities discussed: Current Exercise Habits: The patient does not participate in regular exercise at present, Exercise limited by: orthopedic condition(s)   Goals Addressed             This Visit's Progress    DIET - EAT MORE FRUITS AND VEGETABLES   On track    Recommend eating 3-4 servings of fresh fruits and vegetables per day     DIET - INCREASE WATER INTAKE   On track    Recommend to drink at least 6-8 8oz glasses of water per day       Depression Screen [facility] 2/9 Scores 09/25/2021 08/02/2021 01/30/2021 06/27/2020 02/08/2020 08/10/2019 02/04/2019  PHQ - 2 Score 0 0 0 0 1 0 0  PHQ- 9 Score 0 0 0 - 1 3 0    Fall Risk Fall Risk  09/25/2021 08/02/2021 01/30/2021 06/27/2020 02/08/2020  Falls in the past year? 0 0 0 0 0  Number falls in past yr: 0 0 0 0 -  Injury with Fall? 0 0 0 0 -  Risk for fall due to : No Fall Risks No Fall Risks No Fall Risks No Fall Risks -  Follow up Falls prevention discussed Falls evaluation completed Falls evaluation completed Falls prevention discussed Falls evaluation completed    [place]:  Any stairs in or around the home? Yes  If so, are there any without handrails? No  Home free of loose throw rugs in walkways, pet beds, electrical cords, etc? Yes  Adequate lighting in your home to reduce risk of falls? Yes   ASSISTIVE DEVICES UTILIZED TO PREVENT FALLS:  Life alert? No  Use of a cane, walker or w/c? No  Grab bars in the bathroom? No  Shower chair or bench in shower? No  Elevated toilet seat or a handicapped toilet? Yes   TIMED UP AND GO:  Was the test performed? No . Telephonic visit.   Cognitive Function: Normal cognitive status assessed by direct observation by this Nurse Health Advisor. No abnormalities found.       6CIT Screen 06/27/2020 08/02/2018 07/30/2017  What  Year? 0 points 0 points 0 points  What month? 0 points 0 points 0 points  What time? 0 points 0 points 0 points  Count back from 20 0 points 0 points 0 points  Months in reverse 2 points 0 points 0 points  Repeat phrase 2 points 0 points 0 points  Total Score 4 0 0    Immunizations Immunization History  Administered Date(s) Administered   Fluad Quad(high Dose 65+) 08/10/2019, 05/02/2020, 08/02/2021   Influenza Split 03/27/2014   Influenza, High Dose Seasonal PF 07/30/2017, 07/23/2018   Influenza,inj,Quad PF,6+ Mos 04/05/2014   Moderna Sars-Covid-2 Vaccination 02/08/2020, 03/10/2020   Pneumococcal Conjugate-13 10/04/2013   Pneumococcal Polysaccharide-23 02/12/2017   Tdap 07/21/2008    TDAP status: Due, Education has been provided regarding the importance of this vaccine. Advised may receive this vaccine at local pharmacy or Health Dept. Aware to provide a copy of the vaccination record if obtained from local pharmacy or Health Dept. Verbalized acceptance and understanding.  Flu Vaccine status: Up to date  Pneumococcal vaccine status: Up to date  Covid-19 vaccine status: Completed vaccines  Qualifies for Shingles Vaccine? Yes   Zostavax completed No   Shingrix Completed?: No.    Education has been provided regarding the importance of this vaccine. Patient has been advised to call insurance company to determine out of pocket expense if they have not yet received this vaccine. Advised may also receive vaccine at local pharmacy or Health Dept. Verbalized acceptance and understanding.  Screening Tests Health Maintenance  Topic Date Due   Zoster Vaccines- Shingrix (1 of 2) 10/31/2021 (Originally 02/18/1955)   MAMMOGRAM  05/22/2022   TETANUS/TDAP  07/31/2024   Pneumonia Vaccine 27+ Years old  Completed   INFLUENZA VACCINE  Completed   DEXA SCAN  Completed   HPV VACCINES  Aged Out   COVID-19 Vaccine  Discontinued    Health Maintenance  There are no preventive care reminders to  display for this patient.  Colorectal cancer screening: No longer required.   Mammogram status: Completed 05/22/21. Repeat every year  Bone Density status: Completed 05/15/20. Results reflect: Bone density results: OSTEOPENIA. Repeat every 2 years.  Lung Cancer Screening: (Low Dose CT Chest recommended if Age 28-80 years, 30 pack-year currently smoking OR have quit w/in 15years.) does not qualify.   Additional Screening:  Hepatitis C Screening: does not qualify  Vision Screening: Recommended annual ophthalmology exams for early detection of glaucoma and other disorders of the eye. Is the patient up to date with their annual eye exam?  Yes  Who is the provider or what is the name of the office in which the patient attends annual eye exams? [facility].   Dental Screening: Recommended annual dental exams for proper oral hygiene  Community Resource Referral / Chronic Care Management: CRR required this visit?  No   CCM required this visit?  No      Plan:     I have personally reviewed and noted the following in the patients chart:   Medical and social history Use of alcohol, tobacco or illicit drugs  Current medications and supplements including opioid prescriptions.  Functional ability and status Nutritional status Physical activity Advanced directives List of other physicians Hospitalizations, surgeries, and ER visits in previous 12 months Vitals Screenings to include cognitive, depression, and falls Referrals and appointments  In addition, I have reviewed and discussed with patient certain preventive protocols, quality metrics, and best practice recommendations. A written personalized care plan for preventive services as well as general preventive health recommendations were provided to patient.     Clemetine Marker, LPN  09/25/2021   Nurse Notes: none

## 2021-10-16 DIAGNOSIS — H401131 Primary open-angle glaucoma, bilateral, mild stage: Secondary | ICD-10-CM | POA: Diagnosis not present

## 2021-12-12 ENCOUNTER — Ambulatory Visit
Admission: RE | Admit: 2021-12-12 | Discharge: 2021-12-12 | Disposition: A | Discharge status: Home / Self Care | Payer: Medicare HMO | Source: Ambulatory Visit | Attending: Emergency Medicine | Admitting: Emergency Medicine

## 2021-12-12 VITALS — BP 147/72 | HR 94 | Temp 98.3°F | Resp 18 | Ht 60.0 in | Wt 145.1 lb

## 2021-12-12 DIAGNOSIS — R197 Diarrhea, unspecified: Secondary | ICD-10-CM | POA: Insufficient documentation

## 2021-12-12 DIAGNOSIS — B349 Viral infection, unspecified: Secondary | ICD-10-CM | POA: Diagnosis not present

## 2021-12-12 DIAGNOSIS — R531 Weakness: Secondary | ICD-10-CM | POA: Insufficient documentation

## 2021-12-12 LAB — URINALYSIS, ROUTINE W REFLEX MICROSCOPIC
Bilirubin Urine: NEGATIVE
Glucose, UA: NEGATIVE mg/dL
Hgb urine dipstick: NEGATIVE
Ketones, ur: NEGATIVE mg/dL
Leukocytes,Ua: NEGATIVE
Nitrite: NEGATIVE
Protein, ur: NEGATIVE mg/dL
Specific Gravity, Urine: 1.015 (ref 1.005–1.030)
pH: 8.5 — ABNORMAL HIGH (ref 5.0–8.0)

## 2021-12-12 LAB — COMPREHENSIVE METABOLIC PANEL
ALT: 17 U/L (ref 0–44)
AST: 19 U/L (ref 15–41)
Albumin: 2.8 g/dL — ABNORMAL LOW (ref 3.5–5.0)
Alkaline Phosphatase: 70 U/L (ref 38–126)
Anion gap: 9 (ref 5–15)
BUN: 14 mg/dL (ref 8–23)
CO2: 27 mmol/L (ref 22–32)
Calcium: 8.9 mg/dL (ref 8.9–10.3)
Chloride: 99 mmol/L (ref 98–111)
Creatinine, Ser: 0.74 mg/dL (ref 0.44–1.00)
GFR, Estimated: >60 mL/min (ref >60)
Glucose, Bld: 106 mg/dL — ABNORMAL HIGH (ref 70–99)
Potassium: 3.7 mmol/L (ref 3.5–5.1)
Sodium: 135 mmol/L (ref 135–145)
Total Bilirubin: 0.1 mg/dL — ABNORMAL LOW (ref 0.3–1.2)
Total Protein: 7.4 g/dL (ref 6.5–8.1)

## 2021-12-12 LAB — CBC WITH DIFFERENTIAL/PLATELET
Abs Immature Granulocytes: 0.09 10*3/uL — ABNORMAL HIGH (ref 0.00–0.07)
Basophils Absolute: 0.2 10*3/uL — ABNORMAL HIGH (ref 0.0–0.1)
Basophils Relative: 1 %
Eosinophils Absolute: 0.2 10*3/uL (ref 0.0–0.5)
Eosinophils Relative: 1 %
HCT: 33.5 % — ABNORMAL LOW (ref 36.0–46.0)
Hemoglobin: 10.3 g/dL — ABNORMAL LOW (ref 12.0–15.0)
Immature Granulocytes: 1 %
Lymphocytes Relative: 15 %
Lymphs Abs: 1.9 10*3/uL (ref 0.7–4.0)
MCH: 25.2 pg — ABNORMAL LOW (ref 26.0–34.0)
MCHC: 30.7 g/dL (ref 30.0–36.0)
MCV: 82.1 fL (ref 80.0–100.0)
Monocytes Absolute: 0.9 10*3/uL (ref 0.1–1.0)
Monocytes Relative: 7 %
Neutro Abs: 9.7 10*3/uL — ABNORMAL HIGH (ref 1.7–7.7)
Neutrophils Relative %: 75 %
Platelets: 509 10*3/uL — ABNORMAL HIGH (ref 150–400)
RBC: 4.08 MIL/uL (ref 3.87–5.11)
RDW: 15.5 % (ref 11.5–15.5)
WBC: 12.8 10*3/uL — ABNORMAL HIGH (ref 4.0–10.5)
nRBC: 0 % (ref 0.0–0.2)

## 2021-12-12 LAB — GLUCOSE, CAPILLARY: Glucose-Capillary: 117 mg/dL — ABNORMAL HIGH (ref 70–99)

## 2021-12-12 LAB — SARS CORONAVIRUS 2 BY RT PCR: SARS Coronavirus 2 by RT PCR: NEGATIVE

## 2021-12-12 MED ORDER — CEPHALEXIN 500 MG PO CAPS: 500.0000 mg | ORAL_CAPSULE | Freq: Four times a day (QID) | ORAL | 0 refills | Status: DC

## 2021-12-12 NOTE — ED Provider Notes (Signed)
[facility]    CSN: [id number] Arrival date & time: 12/12/21  1155      History   Chief Complaint Chief Complaint  Patient presents with   Weakness   Diarrhea   Generalized Body Aches    HPI Kristin Miller is a 86 y.o. female.   Patient presents today with weakness diarrhea generalized body aches for the past few days not able to eat well.  She states she has not felt like herself.  Patient states that she has been taking her daily medicines but has not helped much unsure about any fevers.  Denies any chest pain no shortness of breath no nasal congestion.  Denies any urinary symptoms.   Past Medical History:  Diagnosis Date   Allergy    Anemia    H/O AS A CHILD   Arthritis    Back pain    Breast cancer of upper-outer quadrant of right female breast ([place]) 06/12/2015   4 millimeter, T1a, N0; ER 90%, PR 0%, HER-2/neu not overexpressed.  Declined radiation therapy.   Collar bone fracture 05/28/2013   Fractured Collar bone on left   Dysrhythmia    h/o heart skipping beat years ago   Frequent headaches    h/o migraines   Hypertension    Osteopenia 04/2018   Bone density without significant interval change since 2017.    Patient Active Problem List   Diagnosis Date Noted   Contusion of thigh 08/02/2018   BP (high blood pressure) 11/15/2015   OP (osteoporosis) 11/15/2015   Primary cancer of lower-outer quadrant of right breast ([place]) 06/20/2015   Low back pain 08/02/2014   HTN (hypertension) 08/02/2014   Basal cell carcinoma in situ of skin of left shoulder 07/31/2014   Microcalcifications of the breast 11/28/2013   Abnormal finding on mammography 10/03/2013   Skin cancer 10/03/2013   Mammographic microcalcification 05/04/2013   Chronic bilateral low back pain without sciatica 03/29/2012    Past Surgical History:  Procedure Laterality Date   ABDOMINAL HYSTERECTOMY  1970   Partial   BREAST BIOPSY Right 05/28/2015   INVASIVE MAMMARY CARCINOMA     BREAST BIOPSY Right 06/12/2015   Wide excision, sentinel lymph node biopsy.   BREAST LUMPECTOMY WITH AXILLARY LYMPH NODE DISSECTION Right 06/12/2015   4 mm, T1a,N0 withDCIS with clear margins. ER: 90%, PR 0%; Her 2 neu not overexpressed.    CATARACT EXTRACTION W/PHACO Left 02/03/2017   Procedure: CATARACT EXTRACTION PHACO AND INTRAOCULAR LENS PLACEMENT (IOC);  Surgeon: Birder Robson, MD;  Location: [facility];  Service: Ophthalmology;  Laterality: Left;  [place]  00:28 AP% 16.3 CDE 4.68 Fluid pack lot # [id number]   CATARACT EXTRACTION W/PHACO Right 03/01/2019   Procedure: CATARACT EXTRACTION PHACO AND INTRAOCULAR LENS PLACEMENT ([place])  RIGHT;  Surgeon: Birder Robson, MD;  Location: [place];  Service: Ophthalmology;  Laterality: Right;   COLONOSCOPY      OB History     Gravida  3   Para  3   Term      Preterm      AB      Living  3      SAB      IAB      Ectopic      Multiple      Live Births           Obstetric Comments  1st Menstrual Cycle: 14 1st Pregnancy: 20          Home Medications  Prior to Admission medications   Medication Sig Start Date End Date Taking? Authorizing Provider  cephALEXin (KEFLEX) 500 MG capsule Take 1 capsule (500 mg total) by mouth 4 (four) times daily. 12/12/21  Yes Marney Setting, NP  predniSONE (DELTASONE) 10 MG tablet Take by mouth. Take as directed Last dose: Saturday. 09/23/21  Yes [provider]  acetaminophen (TYLENOL) 500 MG tablet Take 500 mg by mouth every 6 (six) hours as needed for pain.    [provider]  Calcium-Vitamin D (CALTRATE 600 PLUS-VIT D PO) Take 2 tablets by mouth daily.    [provider]  latanoprost (XALATAN) 0.005 % ophthalmic solution 1 drop at bedtime. Eye doctor 12/18/20   [provider]  lisinopril-hydrochlorothiazide (ZESTORETIC) 20-12.5 MG tablet     [provider]  losartan-hydrochlorothiazide (HYZAAR) 100-12.5 MG tablet Take 1 tablet  by mouth daily. 08/02/21   Juline Patch, MD  mometasone (NASONEX) 50 MCG/ACT nasal spray     [provider]  montelukast (SINGULAIR) 10 MG tablet     [provider]  Multiple Vitamins-Minerals (CENTRUM SILVER ULTRA WOMENS PO) Take 1 tablet by mouth daily.    [provider]  triamcinolone (NASACORT) 55 MCG/ACT AERO nasal inhaler Place 2 sprays into the nose daily. 08/02/21   Juline Patch, MD    Family History Family History  Problem Relation Age of Onset   Mental illness Mother        Dementia   Stroke Father    Cancer Brother        Lung Cancer   Breast cancer Other     Social History Social History   Tobacco Use   Smoking status: Never   Smokeless tobacco: Never   Tobacco comments:    smoking cessation materials not required  Vaping Use   Vaping Use: Never used  Substance Use Topics   Alcohol use: No   Drug use: No     Allergies   Combigan [brimonidine tartrate-timolol], Lisinopril, and Other   Review of Systems Review of Systems  Constitutional:  Positive for activity change, chills and fatigue. Negative for fever.  Respiratory: Negative.    Cardiovascular: Negative.   Gastrointestinal:  Positive for diarrhea and nausea. Negative for vomiting.  Genitourinary: Negative.   Neurological:  Positive for weakness.    Physical Exam Triage Vital Signs ED Triage Vitals  Enc Vitals Group     BP 12/12/21 1228 (!) 147/72     Pulse Rate 12/12/21 1228 94     Resp 12/12/21 1228 18     Temp 12/12/21 1228 98.3 F (36.8 C)     Temp Source 12/12/21 1228 Oral     SpO2 12/12/21 1228 100 %     Weight 12/12/21 1232 145 lb 1 oz (65.8 kg)     Height 12/12/21 1232 5' (1.524 m)     Head Circumference --      Peak Flow --      Pain Score 12/12/21 1232 0     Pain Loc --      Pain Edu? --      Excl. in [place]? --    No data found.  Updated Vital Signs BP (!) 147/72 (BP Location: Left Arm)   Pulse 94   Temp 98.3 F (36.8 C) (Oral)   Resp 18    Ht 5' (1.524 m)   Wt 145 lb 1 oz (65.8 kg)   SpO2 100%   BMI 28.33 kg/m   Visual  Acuity Right Eye Distance:   Left Eye Distance:   Bilateral Distance:    Right Eye Near:   Left Eye Near:    Bilateral Near:     Physical Exam Constitutional:      Appearance: Normal appearance.  HENT:     Right Ear: Tympanic membrane normal.     Left Ear: Tympanic membrane normal.     Nose: Nose normal.     Mouth/Throat:     Mouth: Mucous membranes are moist.  Eyes:     Pupils: Pupils are equal, round, and reactive to light.  Cardiovascular:     Rate and Rhythm: Normal rate.  Pulmonary:     Effort: Pulmonary effort is normal.  Abdominal:     General: Abdomen is flat.  Musculoskeletal:        General: Normal range of motion.  Skin:    General: Skin is warm.  Neurological:     General: No focal deficit present.     Mental Status: She is alert.     UC Treatments / Results  Labs (all labs ordered are listed, but only abnormal results are displayed) Labs Reviewed  GLUCOSE, CAPILLARY - Abnormal; Notable for the following components:      Result Value   Glucose-Capillary 117 (*)    All other components within normal limits  CBC WITH DIFFERENTIAL/PLATELET - Abnormal; Notable for the following components:   WBC 12.8 (*)    Hemoglobin 10.3 (*)    HCT 33.5 (*)    MCH 25.2 (*)    Platelets 509 (*)    Neutro Abs 9.7 (*)    Basophils Absolute 0.2 (*)    Abs Immature Granulocytes 0.09 (*)    All other components within normal limits  COMPREHENSIVE METABOLIC PANEL - Abnormal; Notable for the following components:   Glucose, Bld 106 (*)    Albumin 2.8 (*)    Total Bilirubin 0.1 (*)    All other components within normal limits  URINALYSIS, ROUTINE W REFLEX MICROSCOPIC - Abnormal; Notable for the following components:   pH 8.5 (*)    All other components within normal limits  SARS CORONAVIRUS 2 BY RT PCR  CBG MONITORING, ED    EKG   Radiology No results  found.  Procedures Procedures (including critical care time)  Medications Ordered in UC Medications - No data to display  Initial Impression / Assessment and Plan / UC Course  I have reviewed the triage vital signs and the nursing notes.  Pertinent labs & imaging results that were available during my care of the patient were reviewed by me and considered in my medical decision making (see chart for details).     Patient has some elevated lab results.  Her urinalysis was negative for infection.  We will treat with an antibiotic to cover.  Patient will need to call her oncologist today to express her symptoms and for follow-up care. Patient should stay hydrated with plenty of fluids eat with the antibiotics to prevent nausea. If symptoms becomes worse she will need to be seen in the emergency room Final Clinical Impressions(s) / UC Diagnoses   Final diagnoses:  Diarrhea, unspecified type  Weakness  Viral illness   Discharge Instructions   None    ED Prescriptions     Medication Sig Dispense Auth. Provider   cephALEXin (KEFLEX) 500 MG capsule Take 1 capsule (500 mg total) by mouth 4 (four) times daily. 20 capsule Marney Setting, NP  PDMP not reviewed this encounter.   Marney Setting, NP 12/12/21 [phone]

## 2021-12-12 NOTE — ED Triage Notes (Signed)
Patient is here for "weakness, loose stools, not myself". Started Monday with extreme fatigue, chills, weakness, temperature from 96 something then to 99.1, loose stool Monday morning". Attempted to ear after it got better "doing ok with, no vomiting, no nausea at present". No abd pain. "Very off balance and very weak". Last void "today, normal amounts". Last loose stool "Monday". Note: (some slight confusion).

## 2022-01-14 DIAGNOSIS — Z85828 Personal history of other malignant neoplasm of skin: Secondary | ICD-10-CM | POA: Diagnosis not present

## 2022-01-14 DIAGNOSIS — D485 Neoplasm of uncertain behavior of skin: Secondary | ICD-10-CM | POA: Diagnosis not present

## 2022-01-14 DIAGNOSIS — C44321 Squamous cell carcinoma of skin of nose: Secondary | ICD-10-CM | POA: Diagnosis not present

## 2022-01-24 DIAGNOSIS — C44321 Squamous cell carcinoma of skin of nose: Secondary | ICD-10-CM | POA: Diagnosis not present

## 2022-01-24 DIAGNOSIS — L814 Other melanin hyperpigmentation: Secondary | ICD-10-CM | POA: Diagnosis not present

## 2022-01-24 DIAGNOSIS — L578 Other skin changes due to chronic exposure to nonionizing radiation: Secondary | ICD-10-CM | POA: Diagnosis not present

## 2022-01-30 ENCOUNTER — Ambulatory Visit (INDEPENDENT_AMBULATORY_CARE_PROVIDER_SITE_OTHER): Payer: Medicare HMO | Admitting: Family Medicine

## 2022-01-30 ENCOUNTER — Encounter: Payer: Self-pay | Admitting: Family Medicine

## 2022-01-30 VITALS — BP 130/70 | HR 68 | Ht 60.0 in | Wt 140.0 lb

## 2022-01-30 DIAGNOSIS — D649 Anemia, unspecified: Secondary | ICD-10-CM

## 2022-01-30 DIAGNOSIS — R7989 Other specified abnormal findings of blood chemistry: Secondary | ICD-10-CM | POA: Diagnosis not present

## 2022-01-30 DIAGNOSIS — I10 Essential (primary) hypertension: Secondary | ICD-10-CM | POA: Diagnosis not present

## 2022-01-30 MED ORDER — LOSARTAN POTASSIUM-HCTZ 100-12.5 MG PO TABS: 1.0000 | ORAL_TABLET | Freq: Every day | ORAL | 1 refills | Status: DC

## 2022-01-30 NOTE — Progress Notes (Signed)
Date:  01/30/2022   Name:  Kristin Miller   DOB:  [birth year]   MRN:  [id number]   Chief Complaint: Hypertension  Hypertension This is a chronic problem. The current episode started more than 1 year ago. The problem has been gradually improving since onset. The problem is controlled. Pertinent negatives include no anxiety, blurred vision, chest pain, headaches, malaise/fatigue, neck pain, orthopnea, palpitations, peripheral edema, PND, shortness of breath or sweats. There are no associated agents to hypertension. Past treatments include angiotensin blockers and diuretics. The current treatment provides mild improvement. There are no compliance problems.  There is no history of angina, kidney disease, CAD/MI, CVA, heart failure, left ventricular hypertrophy, PVD or retinopathy. There is no history of chronic renal disease, a hypertension causing med or renovascular disease.    Lab Results  Component Value Date   NA 135 12/12/2021   K 3.7 12/12/2021   CO2 27 12/12/2021   GLUCOSE 106 (H) 12/12/2021   BUN 14 12/12/2021   CREATININE 0.74 12/12/2021   CALCIUM 8.9 12/12/2021   EGFR 86 08/02/2021   GFRNONAA >60 12/12/2021   Lab Results  Component Value Date   CHOL 178 08/02/2021   HDL 50 08/02/2021   LDLCALC 114 (H) 08/02/2021   TRIG 77 08/02/2021   CHOLHDL 3.8 01/18/2018   Lab Results  Component Value Date   TSH 2.91 10/09/2014   Lab Results  Component Value Date   HGBA1C 5.8 10/09/2014   Lab Results  Component Value Date   WBC 12.8 (H) 12/12/2021   HGB 10.3 (L) 12/12/2021   HCT 33.5 (L) 12/12/2021   MCV 82.1 12/12/2021   PLT 509 (H) 12/12/2021   Lab Results  Component Value Date   ALT 17 12/12/2021   AST 19 12/12/2021   ALKPHOS 70 12/12/2021   BILITOT 0.1 (L) 12/12/2021   No results found for: "25OHVITD2", "25OHVITD3", "VD25OH"   Review of Systems  Constitutional:  Negative for malaise/fatigue.  Eyes:  Negative for blurred vision.  Respiratory:  Negative for  shortness of breath.   Cardiovascular:  Negative for chest pain, palpitations, orthopnea and PND.  Musculoskeletal:  Negative for neck pain.  Neurological:  Negative for headaches.    Patient Active Problem List   Diagnosis Date Noted   Contusion of thigh 08/02/2018   BP (high blood pressure) 11/15/2015   OP (osteoporosis) 11/15/2015   Primary cancer of lower-outer quadrant of right breast ([place]) 06/20/2015   Low back pain 08/02/2014   HTN (hypertension) 08/02/2014   Basal cell carcinoma in situ of skin of left shoulder 07/31/2014   Microcalcifications of the breast 11/28/2013   Abnormal finding on mammography 10/03/2013   Skin cancer 10/03/2013   Mammographic microcalcification 05/04/2013   Chronic bilateral low back pain without sciatica 03/29/2012    Allergies  Allergen Reactions   Combigan [Brimonidine Tartrate-Timolol]     Redness, burning, irritation    Lisinopril Cough   Other Rash    SUTURES-(PARTIAL HYSTERECTOMY)    Past Surgical History:  Procedure Laterality Date   ABDOMINAL HYSTERECTOMY  1970   Partial   BREAST BIOPSY Right 05/28/2015   INVASIVE MAMMARY CARCINOMA    BREAST BIOPSY Right 06/12/2015   Wide excision, sentinel lymph node biopsy.   BREAST LUMPECTOMY WITH AXILLARY LYMPH NODE DISSECTION Right 06/12/2015   4 mm, T1a,N0 withDCIS with clear margins. ER: 90%, PR 0%; Her 2 neu not overexpressed.    CATARACT EXTRACTION W/PHACO Left 02/03/2017   Procedure: CATARACT EXTRACTION PHACO  AND INTRAOCULAR LENS PLACEMENT (IOC);  Surgeon: Birder Robson, MD;  Location: [facility];  Service: Ophthalmology;  Laterality: Left;  [place]  00:28 AP% 16.3 CDE 4.68 Fluid pack lot # [id number]   CATARACT EXTRACTION W/PHACO Right 03/01/2019   Procedure: CATARACT EXTRACTION PHACO AND INTRAOCULAR LENS PLACEMENT ([place])  RIGHT;  Surgeon: Birder Robson, MD;  Location: [place];  Service: Ophthalmology;  Laterality: Right;   COLONOSCOPY      Social History   Tobacco Use    Smoking status: Never   Smokeless tobacco: Never   Tobacco comments:    smoking cessation materials not required  Vaping Use   Vaping Use: Never used  Substance Use Topics   Alcohol use: No   Drug use: No     Medication list has been reviewed and updated.  Current Meds  Medication Sig   acetaminophen (TYLENOL) 500 MG tablet Take 500 mg by mouth every 6 (six) hours as needed for pain.   Calcium-Vitamin D (CALTRATE 600 PLUS-VIT D PO) Take 2 tablets by mouth daily.   latanoprost (XALATAN) 0.005 % ophthalmic solution 1 drop at bedtime. Eye doctor   losartan-hydrochlorothiazide (HYZAAR) 100-12.5 MG tablet Take 1 tablet by mouth daily.   mometasone (NASONEX) 50 MCG/ACT nasal spray    montelukast (SINGULAIR) 10 MG tablet    Multiple Vitamins-Minerals (CENTRUM SILVER ULTRA WOMENS PO) Take 1 tablet by mouth daily.   triamcinolone (NASACORT) 55 MCG/ACT AERO nasal inhaler Place 2 sprays into the nose daily.   [DISCONTINUED] predniSONE (DELTASONE) 10 MG tablet Take by mouth. Take as directed Last dose: Saturday.       01/30/2022    1:30 PM 08/02/2021    1:32 PM 01/30/2021   10:32 AM 02/08/2020   10:17 AM  GAD 7 : Generalized Anxiety Score  Nervous, Anxious, on Edge 0 0 0 0  Control/stop worrying 0 0 0 0  Worry too much - different things 0 0 0 0  Trouble relaxing 0 0 0 0  Restless 0 0 0 0  Easily annoyed or irritable 0 0 0 0  Afraid - awful might happen 0 0 0 0  Total GAD 7 Score 0 0 0 0  Anxiety Difficulty Not difficult at all Not difficult at all         01/30/2022    1:30 PM 09/25/2021    8:30 AM 08/02/2021    1:32 PM  Depression screen PHQ 2/9  Decreased Interest 0 0 0  Down, Depressed, Hopeless 0 0 0  PHQ - 2 Score 0 0 0  Altered sleeping 0 0 0  Tired, decreased energy 0 0 0  Change in appetite 0 0 0  Feeling bad or failure about yourself  0 0 0  Trouble concentrating 0 0 0  Moving slowly or fidgety/restless 0 0 0  Suicidal thoughts 0 0 0  PHQ-9 Score 0 0 0   Difficult doing work/chores Not difficult at all Not difficult at all Not difficult at all    BP Readings from Last 3 Encounters:  01/30/22 130/70  12/12/21 (!) 147/72  08/02/21 130/80    Physical Exam Vitals and nursing note reviewed. Exam conducted with a chaperone present.  Constitutional:      General: She is not in acute distress.    Appearance: She is not diaphoretic.  HENT:     Head: Normocephalic and atraumatic.     Right Ear: External ear normal.     Left Ear: External ear normal.  Nose: Nose normal.  Eyes:     General:        Right eye: No discharge.        Left eye: No discharge.     Conjunctiva/sclera: Conjunctivae normal.     Pupils: Pupils are equal, round, and reactive to light.  Neck:     Thyroid: No thyromegaly.     Vascular: No JVD.  Cardiovascular:     Rate and Rhythm: Normal rate and regular rhythm.     Heart sounds: Normal heart sounds. No murmur heard.    No friction rub. No gallop.  Pulmonary:     Effort: Pulmonary effort is normal.     Breath sounds: Normal breath sounds.  Abdominal:     General: Bowel sounds are normal.     Palpations: Abdomen is soft. There is no mass.     Tenderness: There is no abdominal tenderness. There is no guarding.  Musculoskeletal:        General: Normal range of motion.     Cervical back: Normal range of motion and neck supple.  Lymphadenopathy:     Cervical: No cervical adenopathy.  Skin:    General: Skin is warm and dry.     Wt Readings from Last 3 Encounters:  01/30/22 140 lb (63.5 kg)  12/12/21 145 lb 1 oz (65.8 kg)  08/02/21 145 lb (65.8 kg)    BP 130/70   Pulse 68   Ht 5' (1.524 m)   Wt 140 lb (63.5 kg)   BMI 27.34 kg/m   Assessment and Plan:  1. Essential hypertension Chronic.  Controlled.  Stable.  Blood pressure 130/70.  Continue losartan hydrochlorothiazide 100/12.5 mg once a day.  We will recheck in 6 months - losartan-hydrochlorothiazide (HYZAAR) 100-12.5 MG tablet; Take 1 tablet by  mouth daily.  Dispense: 90 tablet; Refill: 1  2. Anemia, unspecified type Patient was seen in urgent care for diarrhea thought to be a viral syndrome but it was noted that she had an elevated white count as well as an elevated platelet count.  We will repeat CBC for status.  Patient will be given Hemoccult cards to bring back on their appointment next week. - CBC with Differential/Platelet  3. Elevated platelet count As noted above

## 2022-02-03 ENCOUNTER — Other Ambulatory Visit (INDEPENDENT_AMBULATORY_CARE_PROVIDER_SITE_OTHER): Payer: Medicare HMO

## 2022-02-03 DIAGNOSIS — R195 Other fecal abnormalities: Secondary | ICD-10-CM

## 2022-02-03 DIAGNOSIS — D649 Anemia, unspecified: Secondary | ICD-10-CM | POA: Diagnosis not present

## 2022-02-03 LAB — HEMOCCULT GUIAC POC 1CARD (OFFICE)
Card #2 Fecal Occult Blod, POC: NEGATIVE
Card #3 Fecal Occult Blood, POC: POSITIVE
Fecal Occult Blood, POC: POSITIVE — AB

## 2022-02-03 NOTE — Progress Notes (Signed)
Placed referral for 2 out of 3 positive hems

## 2022-02-13 ENCOUNTER — Ambulatory Visit (INDEPENDENT_AMBULATORY_CARE_PROVIDER_SITE_OTHER): Payer: Medicare HMO | Admitting: Family Medicine

## 2022-02-13 ENCOUNTER — Encounter: Payer: Self-pay | Admitting: Family Medicine

## 2022-02-13 VITALS — BP 120/80 | HR 64 | Ht 60.0 in | Wt 137.0 lb

## 2022-02-13 DIAGNOSIS — R195 Other fecal abnormalities: Secondary | ICD-10-CM

## 2022-02-13 DIAGNOSIS — D649 Anemia, unspecified: Secondary | ICD-10-CM

## 2022-02-13 NOTE — Progress Notes (Signed)
Date:  02/13/2022   Name:  Kristin Miller   DOB:  03-Nov-1935   MRN:  [id number]   Chief Complaint: Anemia (Low hemoglobin 2 months ago)  Anemia Presents for follow-up visit. Symptoms include malaise/fatigue. There has been no abdominal pain, anorexia, bruising/bleeding easily, confusion, fever, leg swelling, light-headedness, pallor, palpitations, paresthesias, pica or weight loss. Signs of blood loss that are not present include hematemesis, hematochezia, melena and vaginal bleeding. There are no compliance problems.     Lab Results  Component Value Date   NA 135 12/12/2021   K 3.7 12/12/2021   CO2 27 12/12/2021   GLUCOSE 106 (H) 12/12/2021   BUN 14 12/12/2021   CREATININE 0.74 12/12/2021   CALCIUM 8.9 12/12/2021   EGFR 86 08/02/2021   GFRNONAA >60 12/12/2021   Lab Results  Component Value Date   CHOL 178 08/02/2021   HDL 50 08/02/2021   LDLCALC 114 (H) 08/02/2021   TRIG 77 08/02/2021   CHOLHDL 3.8 01/18/2018   Lab Results  Component Value Date   TSH 2.91 10/09/2014   Lab Results  Component Value Date   HGBA1C 5.8 10/09/2014   Lab Results  Component Value Date   WBC 12.8 (H) 12/12/2021   HGB 10.3 (L) 12/12/2021   HCT 33.5 (L) 12/12/2021   MCV 82.1 12/12/2021   PLT 509 (H) 12/12/2021   Lab Results  Component Value Date   ALT 17 12/12/2021   AST 19 12/12/2021   ALKPHOS 70 12/12/2021   BILITOT 0.1 (L) 12/12/2021   No results found for: "25OHVITD2", "25OHVITD3", "VD25OH"   Review of Systems  Constitutional:  Positive for fatigue and malaise/fatigue. Negative for fever, unexpected weight change and weight loss.  Respiratory:  Negative for cough, chest tightness, shortness of breath and wheezing.   Cardiovascular:  Negative for chest pain, palpitations and leg swelling.  Gastrointestinal:  Positive for diarrhea. Negative for abdominal distention, abdominal pain, anal bleeding, anorexia, blood in stool, constipation, hematemesis, hematochezia, melena, nausea,  rectal pain and vomiting.  Genitourinary:  Negative for dysuria and vaginal bleeding.  Skin:  Negative for pallor.  Neurological:  Negative for light-headedness and paresthesias.  Hematological:  Does not bruise/bleed easily.  Psychiatric/Behavioral:  Negative for confusion.     Patient Active Problem List   Diagnosis Date Noted   Contusion of thigh 08/02/2018   BP (high blood pressure) 11/15/2015   OP (osteoporosis) 11/15/2015   Primary cancer of lower-outer quadrant of right breast ([place]) 06/20/2015   Low back pain 08/02/2014   HTN (hypertension) 08/02/2014   Basal cell carcinoma in situ of skin of left shoulder 07/31/2014   Microcalcifications of the breast 11/28/2013   Abnormal finding on mammography 10/03/2013   Skin cancer 10/03/2013   Mammographic microcalcification 05/04/2013   Chronic bilateral low back pain without sciatica 03/29/2012    Allergies  Allergen Reactions   Combigan [Brimonidine Tartrate-Timolol]     Redness, burning, irritation    Lisinopril Cough   Other Rash    SUTURES-(PARTIAL HYSTERECTOMY)    Past Surgical History:  Procedure Laterality Date   ABDOMINAL HYSTERECTOMY  1970   Partial   BREAST BIOPSY Right 05/28/2015   INVASIVE MAMMARY CARCINOMA    BREAST BIOPSY Right 06/12/2015   Wide excision, sentinel lymph node biopsy.   BREAST LUMPECTOMY WITH AXILLARY LYMPH NODE DISSECTION Right 06/12/2015   4 mm, T1a,N0 withDCIS with clear margins. ER: 90%, PR 0%; Her 2 neu not overexpressed.    CATARACT EXTRACTION W/PHACO Left 02/03/2017  Procedure: CATARACT EXTRACTION PHACO AND INTRAOCULAR LENS PLACEMENT (IOC);  Surgeon: Birder Robson, MD;  Location: [facility];  Service: Ophthalmology;  Laterality: Left;  [place]  00:28 AP% 16.3 CDE 4.68 Fluid pack lot # [id number]   CATARACT EXTRACTION W/PHACO Right 03/01/2019   Procedure: CATARACT EXTRACTION PHACO AND INTRAOCULAR LENS PLACEMENT ([place])  RIGHT;  Surgeon: Birder Robson, MD;  Location: [facility];   Service: Ophthalmology;  Laterality: Right;   COLONOSCOPY      Social History   Tobacco Use   Smoking status: Never   Smokeless tobacco: Never   Tobacco comments:    smoking cessation materials not required  Vaping Use   Vaping Use: Never used  Substance Use Topics   Alcohol use: No   Drug use: No     Medication list has been reviewed and updated.  No outpatient medications have been marked as taking for the 02/13/22 encounter (Office Visit) with Juline Patch, MD.       01/30/2022    1:30 PM 08/02/2021    1:32 PM 01/30/2021   10:32 AM 02/08/2020   10:17 AM  GAD 7 : Generalized Anxiety Score  Nervous, Anxious, on Edge 0 0 0 0  Control/stop worrying 0 0 0 0  Worry too much - different things 0 0 0 0  Trouble relaxing 0 0 0 0  Restless 0 0 0 0  Easily annoyed or irritable 0 0 0 0  Afraid - awful might happen 0 0 0 0  Total GAD 7 Score 0 0 0 0  Anxiety Difficulty Not difficult at all Not difficult at all         01/30/2022    1:30 PM 09/25/2021    8:30 AM 08/02/2021    1:32 PM  Depression screen PHQ 2/9  Decreased Interest 0 0 0  Down, Depressed, Hopeless 0 0 0  PHQ - 2 Score 0 0 0  Altered sleeping 0 0 0  Tired, decreased energy 0 0 0  Change in appetite 0 0 0  Feeling bad or failure about yourself  0 0 0  Trouble concentrating 0 0 0  Moving slowly or fidgety/restless 0 0 0  Suicidal thoughts 0 0 0  PHQ-9 Score 0 0 0  Difficult doing work/chores Not difficult at all Not difficult at all Not difficult at all    BP Readings from Last 3 Encounters:  02/13/22 120/80  01/30/22 130/70  12/12/21 (!) 147/72    Physical Exam Vitals and nursing note reviewed. Exam conducted with a chaperone present.  Constitutional:      General: She is not in acute distress.    Appearance: She is not diaphoretic.  HENT:     Head: Normocephalic and atraumatic.     Right Ear: External ear normal.     Left Ear: External ear normal.     Nose: Nose normal.     Mouth/Throat:      Lips: Pink.     Mouth: Mucous membranes are moist.     Pharynx: Oropharynx is clear. Uvula midline. No pharyngeal swelling or posterior oropharyngeal erythema.     Tonsils: No tonsillar exudate.  Eyes:     General: Lids are normal. Lids are everted, no foreign bodies appreciated. Gaze aligned appropriately.        Right eye: No discharge.        Left eye: No discharge.     Conjunctiva/sclera: Conjunctivae normal.     Pupils: Pupils are equal, round, and  reactive to light.     Comments: Conjunctiva pale  Neck:     Thyroid: No thyromegaly.     Vascular: No JVD.  Cardiovascular:     Rate and Rhythm: Normal rate and regular rhythm.     Heart sounds: Normal heart sounds. No murmur heard.    No friction rub. No gallop.  Pulmonary:     Effort: Pulmonary effort is normal.     Breath sounds: Normal breath sounds.  Abdominal:     General: Bowel sounds are normal.     Palpations: Abdomen is soft. There is no mass.     Tenderness: There is no abdominal tenderness. There is no guarding.  Musculoskeletal:        General: Normal range of motion.     Cervical back: Normal range of motion and neck supple.  Lymphadenopathy:     Cervical: No cervical adenopathy.  Skin:    General: Skin is warm and dry.  Neurological:     Mental Status: She is alert.     Wt Readings from Last 3 Encounters:  02/13/22 137 lb (62.1 kg)  01/30/22 140 lb (63.5 kg)  12/12/21 145 lb 1 oz (65.8 kg)    BP 120/80   Pulse 64   Ht 5' (1.524 m)   Wt 137 lb (62.1 kg)   BMI 26.76 kg/m   Assessment and Plan: 1. Anemia, unspecified type Chronic.  Controlled.  Stable.  Last A1c noted hemoglobin in the 10.3 range.  Patient had 2 out of 3 Hemoccults that were positive.  We will recheck a CBC to see if there has been any further decrease in hemoglobin.  Patient does have an upcoming appointment with GI but does not until the latter part of August. - CBC w/Diff/Platelet  2. Guaiac positive stools As noted above guaiac  positive stools were noted 2 out of 3 and we will await to see if hemoglobin is stabilized or improved if there is been further decrease will recheck perhaps. - CBC w/Diff/Platelet

## 2022-02-14 ENCOUNTER — Telehealth: Payer: Self-pay | Admitting: Family Medicine

## 2022-02-14 LAB — CBC WITH DIFFERENTIAL/PLATELET
Basophils Absolute: 0.2 10*3/uL (ref 0.0–0.2)
Basos: 1 %
EOS (ABSOLUTE): 0.2 10*3/uL (ref 0.0–0.4)
Eos: 2 %
Hematocrit: 33.4 % — ABNORMAL LOW (ref 34.0–46.6)
Hemoglobin: 10.7 g/dL — ABNORMAL LOW (ref 11.1–15.9)
Immature Grans (Abs): 0 10*3/uL (ref 0.0–0.1)
Immature Granulocytes: 0 %
Lymphocytes Absolute: 2.8 10*3/uL (ref 0.7–3.1)
Lymphs: 20 %
MCH: 25.5 pg — ABNORMAL LOW (ref 26.6–33.0)
MCHC: 32 g/dL (ref 31.5–35.7)
MCV: 80 fL (ref 79–97)
Monocytes Absolute: 1.3 10*3/uL — ABNORMAL HIGH (ref 0.1–0.9)
Monocytes: 10 %
Neutrophils Absolute: 9.2 10*3/uL — ABNORMAL HIGH (ref 1.4–7.0)
Neutrophils: 67 %
Platelets: 485 10*3/uL — ABNORMAL HIGH (ref 150–450)
RBC: 4.19 x10E6/uL (ref 3.77–5.28)
RDW: 14.4 % (ref 11.7–15.4)
WBC: 13.7 10*3/uL — ABNORMAL HIGH (ref 3.4–10.8)

## 2022-02-14 NOTE — Telephone Encounter (Signed)
Copied from [place] [id number]. Topic: General - Call Back - No Documentation >> Feb 14, 2022 11:40 AM Cyndi Bender wrote: Reason for CRM: Pt stated she had a missed call from the office so she was returning the call. Pt requests call back asap. Cb# [phone]

## 2022-02-17 ENCOUNTER — Ambulatory Visit (INDEPENDENT_AMBULATORY_CARE_PROVIDER_SITE_OTHER): Payer: Medicare HMO | Admitting: Family Medicine

## 2022-02-17 ENCOUNTER — Encounter: Payer: Self-pay | Admitting: Family Medicine

## 2022-02-17 VITALS — BP 138/76 | HR 68 | Ht 60.0 in | Wt 138.0 lb

## 2022-02-17 DIAGNOSIS — D72829 Elevated white blood cell count, unspecified: Secondary | ICD-10-CM | POA: Diagnosis not present

## 2022-02-17 LAB — POCT URINALYSIS DIPSTICK
Bilirubin, UA: NEGATIVE
Blood, UA: NEGATIVE
Glucose, UA: NEGATIVE
Ketones, UA: NEGATIVE
Leukocytes, UA: NEGATIVE
Nitrite, UA: NEGATIVE
Protein, UA: NEGATIVE
Spec Grav, UA: 1.01 (ref 1.010–1.025)
Urobilinogen, UA: 0.2 E.U./dL
pH, UA: 6 (ref 5.0–8.0)

## 2022-02-17 NOTE — Progress Notes (Signed)
Date:  02/17/2022   Name:  Kristin Miller   DOB:  13-Aug-1935   MRN:  [id number]   Chief Complaint: Follow-up (Elevated WBC- check urine and listen to lungs)  Patient is a 86 year old female who presents for a abn cbc exam. The patient reports the following problems: none/no cough/no dysuria/no abd pain no upper resp. Health maintenance has been reviewed awaiting gi consult.      Lab Results  Component Value Date   NA 135 12/12/2021   K 3.7 12/12/2021   CO2 27 12/12/2021   GLUCOSE 106 (H) 12/12/2021   BUN 14 12/12/2021   CREATININE 0.74 12/12/2021   CALCIUM 8.9 12/12/2021   EGFR 86 08/02/2021   GFRNONAA >60 12/12/2021   Lab Results  Component Value Date   CHOL 178 08/02/2021   HDL 50 08/02/2021   LDLCALC 114 (H) 08/02/2021   TRIG 77 08/02/2021   CHOLHDL 3.8 01/18/2018   Lab Results  Component Value Date   TSH 2.91 10/09/2014   Lab Results  Component Value Date   HGBA1C 5.8 10/09/2014   Lab Results  Component Value Date   WBC 13.7 (H) 02/13/2022   HGB 10.7 (L) 02/13/2022   HCT 33.4 (L) 02/13/2022   MCV 80 02/13/2022   PLT 485 (H) 02/13/2022   Lab Results  Component Value Date   ALT 17 12/12/2021   AST 19 12/12/2021   ALKPHOS 70 12/12/2021   BILITOT 0.1 (L) 12/12/2021   No results found for: "25OHVITD2", "25OHVITD3", "VD25OH"   Review of Systems  Constitutional:  Negative for chills and fever.  HENT:  Negative for drooling, ear discharge, ear pain, rhinorrhea, sinus pressure, sinus pain and sore throat.   Respiratory:  Negative for cough, chest tightness, shortness of breath and wheezing.   Cardiovascular:  Negative for chest pain, palpitations and leg swelling.  Gastrointestinal:  Negative for abdominal pain, blood in stool, constipation, diarrhea and nausea.  Endocrine: Negative for polydipsia.  Genitourinary:  Negative for dysuria, frequency, hematuria and urgency.  Musculoskeletal:  Positive for back pain. Negative for myalgias and neck pain.   Skin:  Negative for rash and wound.  Allergic/Immunologic: Negative for environmental allergies.  Neurological:  Negative for dizziness and headaches.  Hematological:  Does not bruise/bleed easily.  Psychiatric/Behavioral:  Negative for suicidal ideas. The patient is not nervous/anxious.     Patient Active Problem List   Diagnosis Date Noted   Contusion of thigh 08/02/2018   BP (high blood pressure) 11/15/2015   OP (osteoporosis) 11/15/2015   Primary cancer of lower-outer quadrant of right breast ([place]) 06/20/2015   Low back pain 08/02/2014   HTN (hypertension) 08/02/2014   Basal cell carcinoma in situ of skin of left shoulder 07/31/2014   Microcalcifications of the breast 11/28/2013   Abnormal finding on mammography 10/03/2013   Skin cancer 10/03/2013   Mammographic microcalcification 05/04/2013   Chronic bilateral low back pain without sciatica 03/29/2012    Allergies  Allergen Reactions   Combigan [Brimonidine Tartrate-Timolol]     Redness, burning, irritation    Lisinopril Cough   Other Rash    SUTURES-(PARTIAL HYSTERECTOMY)    Past Surgical History:  Procedure Laterality Date   ABDOMINAL HYSTERECTOMY  1970   Partial   BREAST BIOPSY Right 05/28/2015   INVASIVE MAMMARY CARCINOMA    BREAST BIOPSY Right 06/12/2015   Wide excision, sentinel lymph node biopsy.   BREAST LUMPECTOMY WITH AXILLARY LYMPH NODE DISSECTION Right 06/12/2015   4 mm, T1a,N0  withDCIS with clear margins. ER: 90%, PR 0%; Her 2 neu not overexpressed.    CATARACT EXTRACTION W/PHACO Left 02/03/2017   Procedure: CATARACT EXTRACTION PHACO AND INTRAOCULAR LENS PLACEMENT (IOC);  Surgeon: Birder Robson, MD;  Location: [facility];  Service: Ophthalmology;  Laterality: Left;  [place]  00:28 AP% 16.3 CDE 4.68 Fluid pack lot # [id number]   CATARACT EXTRACTION W/PHACO Right 03/01/2019   Procedure: CATARACT EXTRACTION PHACO AND INTRAOCULAR LENS PLACEMENT ([place])  RIGHT;  Surgeon: Birder Robson, MD;  Location: [facility];  Service: Ophthalmology;  Laterality: Right;   COLONOSCOPY      Social History   Tobacco Use   Smoking status: Never   Smokeless tobacco: Never   Tobacco comments:    smoking cessation materials not required  Vaping Use   Vaping Use: Never used  Substance Use Topics   Alcohol use: No   Drug use: No     Medication list has been reviewed and updated.  Current Meds  Medication Sig   acetaminophen (TYLENOL) 500 MG tablet Take 500 mg by mouth every 6 (six) hours as needed for pain.   Calcium-Vitamin D (CALTRATE 600 PLUS-VIT D PO) Take 2 tablets by mouth daily.   latanoprost (XALATAN) 0.005 % ophthalmic solution 1 drop at bedtime. Eye doctor   losartan-hydrochlorothiazide (HYZAAR) 100-12.5 MG tablet Take 1 tablet by mouth daily.   mometasone (NASONEX) 50 MCG/ACT nasal spray    montelukast (SINGULAIR) 10 MG tablet    Multiple Vitamins-Minerals (CENTRUM SILVER ULTRA WOMENS PO) Take 1 tablet by mouth daily.   triamcinolone (NASACORT) 55 MCG/ACT AERO nasal inhaler Place 2 sprays into the nose daily.       01/30/2022    1:30 PM 08/02/2021    1:32 PM 01/30/2021   10:32 AM 02/08/2020   10:17 AM  GAD 7 : Generalized Anxiety Score  Nervous, Anxious, on Edge 0 0 0 0  Control/stop worrying 0 0 0 0  Worry too much - different things 0 0 0 0  Trouble relaxing 0 0 0 0  Restless 0 0 0 0  Easily annoyed or irritable 0 0 0 0  Afraid - awful might happen 0 0 0 0  Total GAD 7 Score 0 0 0 0  Anxiety Difficulty Not difficult at all Not difficult at all         01/30/2022    1:30 PM 09/25/2021    8:30 AM 08/02/2021    1:32 PM  Depression screen PHQ 2/9  Decreased Interest 0 0 0  Down, Depressed, Hopeless 0 0 0  PHQ - 2 Score 0 0 0  Altered sleeping 0 0 0  Tired, decreased energy 0 0 0  Change in appetite 0 0 0  Feeling bad or failure about yourself  0 0 0  Trouble concentrating 0 0 0  Moving slowly or fidgety/restless 0 0 0  Suicidal thoughts 0 0 0  PHQ-9 Score 0 0 0   Difficult doing work/chores Not difficult at all Not difficult at all Not difficult at all    BP Readings from Last 3 Encounters:  02/17/22 138/76  02/13/22 120/80  01/30/22 130/70    Physical Exam Vitals and nursing note reviewed. Exam conducted with a chaperone present.  Constitutional:      General: She is not in acute distress.    Appearance: She is not diaphoretic.  HENT:     Head: Normocephalic and atraumatic.     Right Ear: External ear normal.  Left Ear: External ear normal.     Nose: Nose normal.  Eyes:     General:        Right eye: No discharge.        Left eye: No discharge.     Conjunctiva/sclera: Conjunctivae normal.     Pupils: Pupils are equal, round, and reactive to light.  Neck:     Thyroid: No thyromegaly.     Vascular: No JVD.  Cardiovascular:     Rate and Rhythm: Normal rate and regular rhythm.     Heart sounds: Normal heart sounds. No murmur heard.    No friction rub. No gallop.  Pulmonary:     Effort: Pulmonary effort is normal.     Breath sounds: Normal breath sounds. No decreased breath sounds, wheezing, rhonchi or rales.  Abdominal:     General: Bowel sounds are normal.     Palpations: Abdomen is soft. There is no hepatomegaly, splenomegaly or mass.     Tenderness: There is no abdominal tenderness. There is no guarding.  Musculoskeletal:        General: Normal range of motion.     Cervical back: Normal range of motion and neck supple.  Lymphadenopathy:     Cervical: No cervical adenopathy.  Skin:    General: Skin is warm and dry.  Neurological:     Mental Status: She is alert.     Wt Readings from Last 3 Encounters:  02/17/22 138 lb (62.6 kg)  02/13/22 137 lb (62.1 kg)  01/30/22 140 lb (63.5 kg)    BP 138/76   Pulse 68   Ht 5' (1.524 m)   Wt 138 lb (62.6 kg)   BMI 26.95 kg/m   Assessment and Plan:  1. Leukocytosis, unspecified type Follow-up.  New onset.  Stable.  Review of medications no new medications or concerns.   Review of systems includes no abdominal concerns no upper respiratory concerns no pulmonary concerns no bruising and no bleeding concerns no urinary tract infection concerns no GI concerns other than the quite positive stools.  Urinalysis is unremarkable.  We will recheck CBC to see if it is beginning to return to normal. - POCT urinalysis dipstick - CBC with Differential/Platelet

## 2022-03-11 DIAGNOSIS — Z859 Personal history of malignant neoplasm, unspecified: Secondary | ICD-10-CM | POA: Diagnosis not present

## 2022-03-11 DIAGNOSIS — Z86018 Personal history of other benign neoplasm: Secondary | ICD-10-CM | POA: Diagnosis not present

## 2022-03-11 DIAGNOSIS — Z85828 Personal history of other malignant neoplasm of skin: Secondary | ICD-10-CM | POA: Diagnosis not present

## 2022-03-11 DIAGNOSIS — L821 Other seborrheic keratosis: Secondary | ICD-10-CM | POA: Diagnosis not present

## 2022-03-11 DIAGNOSIS — L578 Other skin changes due to chronic exposure to nonionizing radiation: Secondary | ICD-10-CM | POA: Diagnosis not present

## 2022-03-11 DIAGNOSIS — L298 Other pruritus: Secondary | ICD-10-CM | POA: Diagnosis not present

## 2022-03-11 DIAGNOSIS — L570 Actinic keratosis: Secondary | ICD-10-CM | POA: Diagnosis not present

## 2022-03-11 DIAGNOSIS — D225 Melanocytic nevi of trunk: Secondary | ICD-10-CM | POA: Diagnosis not present

## 2022-03-11 DIAGNOSIS — Z09 Encounter for follow-up examination after completed treatment for conditions other than malignant neoplasm: Secondary | ICD-10-CM | POA: Diagnosis not present

## 2022-04-23 ENCOUNTER — Other Ambulatory Visit: Payer: Self-pay | Admitting: General Surgery

## 2022-04-23 DIAGNOSIS — M810 Age-related osteoporosis without current pathological fracture: Secondary | ICD-10-CM

## 2022-04-23 DIAGNOSIS — C50511 Malignant neoplasm of lower-outer quadrant of right female breast: Secondary | ICD-10-CM

## 2022-04-24 DIAGNOSIS — H401131 Primary open-angle glaucoma, bilateral, mild stage: Secondary | ICD-10-CM | POA: Diagnosis not present

## 2022-05-14 DIAGNOSIS — H401132 Primary open-angle glaucoma, bilateral, moderate stage: Secondary | ICD-10-CM | POA: Insufficient documentation

## 2022-06-18 ENCOUNTER — Ambulatory Visit: Payer: Medicare HMO | Admitting: Gastroenterology

## 2022-06-18 ENCOUNTER — Encounter: Payer: Self-pay | Admitting: Gastroenterology

## 2022-06-18 VITALS — BP 152/86 | HR 123 | Temp 97.5°F | Ht 60.0 in | Wt 132.0 lb

## 2022-06-18 DIAGNOSIS — D500 Iron deficiency anemia secondary to blood loss (chronic): Secondary | ICD-10-CM | POA: Diagnosis not present

## 2022-06-18 MED ORDER — NA SULFATE-K SULFATE-MG SULF 17.5-3.13-1.6 GM/177ML PO SOLN: 1.0000 | Freq: Once | ORAL | 0 refills | Status: AC

## 2022-06-18 NOTE — Addendum Note (Signed)
Addended by: Lurlean Nanny on: 06/18/2022 10:10 AM   Modules accepted: Orders

## 2022-06-18 NOTE — Progress Notes (Signed)
Gastroenterology Consultation  Referring Provider:     Juline Patch, MD Primary Care Physician:  Juline Patch, MD Primary Gastroenterologist:  Dr. Allen Norris     Reason for Consultation:     Iron deficiency anemia        HPI:   Kristin Miller is a 86 y.o. y/o female referred for consultation & management of iron deficiency anemia by Dr. Juline Patch, MD. This patient comes in today with a history of iron deficiency anemia with a hemoglobin around 10 with a low MCV and iron studies showing a saturation of 5%.  At that time the patient's total iron was 13.  The patient's hemoglobin has been similar for the last 6 months.   The patient is now taking iron tablets and states that her stools have become dark green but prior to that does not recall having any dark stools.  The patient also denies any unexplained weight loss or abdominal pain.  She states that she has not had a colonoscopy in many years.  She also reports that she is seeing a hematologist due to a high white cell count with her last two values being 12 and then a follow-up showed it to be 16.  Past Medical History:  Diagnosis Date   Allergy    Anemia    H/O AS A CHILD   Arthritis    Back pain    Breast cancer of upper-outer quadrant of right female breast ([place]) 06/12/2015   4 millimeter, T1a, N0; ER 90%, PR 0%, HER-2/neu not overexpressed.  Declined radiation therapy.   Collar bone fracture 05/28/2013   Fractured Collar bone on left   Dysrhythmia    h/o heart skipping beat years ago   Frequent headaches    h/o migraines   Hypertension    Osteopenia 04/2018   Bone density without significant interval change since 2017.    Past Surgical History:  Procedure Laterality Date   ABDOMINAL HYSTERECTOMY  1970   Partial   BREAST BIOPSY Right 05/28/2015   INVASIVE MAMMARY CARCINOMA    BREAST BIOPSY Right 06/12/2015   Wide excision, sentinel lymph node biopsy.   BREAST LUMPECTOMY WITH AXILLARY LYMPH NODE DISSECTION Right  06/12/2015   4 mm, T1a,N0 withDCIS with clear margins. ER: 90%, PR 0%; Her 2 neu not overexpressed.    CATARACT EXTRACTION W/PHACO Left 02/03/2017   Procedure: CATARACT EXTRACTION PHACO AND INTRAOCULAR LENS PLACEMENT (IOC);  Surgeon: Birder Robson, MD;  Location: [facility];  Service: Ophthalmology;  Laterality: Left;  [place]  00:28 AP% 16.3 CDE 4.68 Fluid pack lot # [id number]   CATARACT EXTRACTION W/PHACO Right 03/01/2019   Procedure: CATARACT EXTRACTION PHACO AND INTRAOCULAR LENS PLACEMENT ([place])  RIGHT;  Surgeon: Birder Robson, MD;  Location: [facility];  Service: Ophthalmology;  Laterality: Right;   COLONOSCOPY      Prior to Admission medications   Medication Sig Start Date End Date Taking? Authorizing Provider  acetaminophen (TYLENOL) 500 MG tablet Take 500 mg by mouth every 6 (six) hours as needed for pain.    [provider]  Calcium-Vitamin D (CALTRATE 600 PLUS-VIT D PO) Take 2 tablets by mouth daily.    [provider]  latanoprost (XALATAN) 0.005 % ophthalmic solution 1 drop at bedtime. Eye doctor 12/18/20   [provider]  losartan-hydrochlorothiazide (HYZAAR) 100-12.5 MG tablet Take 1 tablet by mouth daily. 01/30/22   Juline Patch, MD  mometasone (NASONEX) 50 MCG/ACT nasal spray  [provider]  montelukast (SINGULAIR) 10 MG tablet     [provider]  Multiple Vitamins-Minerals (CENTRUM SILVER ULTRA WOMENS PO) Take 1 tablet by mouth daily.    [provider]  triamcinolone (NASACORT) 55 MCG/ACT AERO nasal inhaler Place 2 sprays into the nose daily. 08/02/21   Juline Patch, MD    Family History  Problem Relation Age of Onset   Mental illness Mother        Dementia   Stroke Father    Cancer Brother        Lung Cancer   Breast cancer Other      Social History   Tobacco Use   Smoking status: Never   Smokeless tobacco: Never   Tobacco comments:    smoking cessation materials not required  Vaping Use    Vaping Use: Never used  Substance Use Topics   Alcohol use: No   Drug use: No    Allergies as of 06/18/2022 - Review Complete 02/17/2022  Allergen Reaction Noted   Combigan [brimonidine tartrate-timolol]  01/30/2021   Lisinopril Cough 08/22/2020   Other Rash 06/06/2015    Review of Systems:    All systems reviewed and negative except where noted in HPI.   Physical Exam:  There were no vitals taken for this visit. No LMP recorded. Patient has had a hysterectomy. General:   Alert,  Well-developed, well-nourished, pleasant and cooperative in NAD Head:  Normocephalic and atraumatic. Eyes:  Sclera clear, no icterus.   Conjunctiva pink. Ears:  Normal auditory acuity. Neck:  Supple; no masses or thyromegaly. Lungs:  Respirations even and unlabored.  Clear throughout to auscultation.   No wheezes, crackles, or rhonchi. No acute distress. Heart:  Regular rate and rhythm; no murmurs, clicks, rubs, or gallops. Abdomen:  Normal bowel sounds.  No bruits.  Soft, non-tender and non-distended without masses, hepatosplenomegaly or hernias noted.  No guarding or rebound tenderness.  Negative Carnett sign.   Rectal:  Deferred.  Pulses:  Normal pulses noted. Extremities:  No clubbing or edema.  No cyanosis. Neurologic:  Alert and oriented x3;  grossly normal neurologically. Skin:  Intact without significant lesions or rashes.  No jaundice. Lymph Nodes:  No significant cervical adenopathy. Psych:  Alert and cooperative. Normal mood and affect.  Imaging Studies: No results found.  Assessment and Plan:   Kristin Miller is a 86 y.o. y/o female who comes in today with a iron deficiency anemia with a low MCV low iron and low iron saturation.  The patient states she is otherwise healthy except she is seeing hematology for an increased white cell count.  The patient will be set up for a EGD and colonoscopy to look for a possible sign of her anemia.  The patient has been explained the plan agrees  with it.    [name], MD. Marval Regal    Note: This dictation was prepared with Dragon dictation along with smaller phrase technology. Any transcriptional errors that result from this process are unintentional.

## 2022-06-20 MED ORDER — CLENPIQ 10-3.5-12 MG-GM -GM/175ML PO SOLN: 1.0000 | Freq: Once | ORAL | 0 refills | Status: AC

## 2022-06-20 NOTE — Addendum Note (Signed)
Addended by: Lurlean Nanny on: 06/20/2022 11:00 AM   Modules accepted: Orders

## 2022-06-23 ENCOUNTER — Encounter: Payer: Self-pay | Admitting: Gastroenterology

## 2022-06-27 ENCOUNTER — Ambulatory Visit: Payer: Medicare HMO | Admitting: Anesthesiology

## 2022-06-27 ENCOUNTER — Other Ambulatory Visit: Payer: Self-pay

## 2022-06-27 ENCOUNTER — Ambulatory Visit
Admission: RE | Admit: 2022-06-27 | Discharge: 2022-06-27 | Disposition: A | Discharge status: Home / Self Care | Payer: Medicare HMO | Source: Ambulatory Visit | Attending: Gastroenterology | Admitting: Gastroenterology

## 2022-06-27 ENCOUNTER — Encounter
Admission: RE | Disposition: A | Discharge status: Home / Self Care | Payer: Self-pay | Source: Ambulatory Visit | Attending: Gastroenterology

## 2022-06-27 ENCOUNTER — Encounter: Payer: Self-pay | Admitting: Gastroenterology

## 2022-06-27 DIAGNOSIS — D500 Iron deficiency anemia secondary to blood loss (chronic): Secondary | ICD-10-CM

## 2022-06-27 DIAGNOSIS — I10 Essential (primary) hypertension: Secondary | ICD-10-CM | POA: Insufficient documentation

## 2022-06-27 DIAGNOSIS — M1990 Unspecified osteoarthritis, unspecified site: Secondary | ICD-10-CM | POA: Diagnosis not present

## 2022-06-27 DIAGNOSIS — K6389 Other specified diseases of intestine: Secondary | ICD-10-CM

## 2022-06-27 DIAGNOSIS — K2289 Other specified disease of esophagus: Secondary | ICD-10-CM | POA: Insufficient documentation

## 2022-06-27 DIAGNOSIS — D490 Neoplasm of unspecified behavior of digestive system: Secondary | ICD-10-CM | POA: Diagnosis not present

## 2022-06-27 DIAGNOSIS — K449 Diaphragmatic hernia 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 gangrene: Secondary | ICD-10-CM | POA: Insufficient documentation

## 2022-06-27 DIAGNOSIS — C186 Malignant neoplasm of descending colon: Secondary | ICD-10-CM | POA: Insufficient documentation

## 2022-06-27 DIAGNOSIS — Z853 Personal history of malignant neoplasm of breast: Secondary | ICD-10-CM | POA: Diagnosis not present

## 2022-06-27 DIAGNOSIS — D509 Iron deficiency anemia, unspecified: Secondary | ICD-10-CM | POA: Insufficient documentation

## 2022-06-27 DIAGNOSIS — M8580 Other specified disorders of bone density and structure, unspecified site: Secondary | ICD-10-CM | POA: Insufficient documentation

## 2022-06-27 HISTORY — PX: COLONOSCOPY WITH PROPOFOL: SHX5780

## 2022-06-27 HISTORY — PX: ESOPHAGOGASTRODUODENOSCOPY (EGD) WITH PROPOFOL: SHX5813

## 2022-06-27 SURGERY — COLONOSCOPY WITH PROPOFOL
Anesthesia: General | Site: Rectum

## 2022-06-27 MED ORDER — ONDANSETRON HCL 4 MG/2ML IJ SOLN
INTRAMUSCULAR | Status: DC | PRN
  Administered 2022-06-27: 4 mg via INTRAVENOUS

## 2022-06-27 MED ORDER — LACTATED RINGERS IV SOLN: INTRAVENOUS | Status: DC

## 2022-06-27 MED ORDER — PROPOFOL 10 MG/ML IV BOLUS
INTRAVENOUS | Status: DC | PRN
  Administered 2022-06-27 (×2): 30 mg via INTRAVENOUS
  Administered 2022-06-27: 20 mg via INTRAVENOUS
  Administered 2022-06-27: 30 mg via INTRAVENOUS
  Administered 2022-06-27: 20 mg via INTRAVENOUS
  Administered 2022-06-27: 60 mg via INTRAVENOUS
  Administered 2022-06-27: 30 mg via INTRAVENOUS

## 2022-06-27 MED ORDER — STERILE WATER FOR IRRIGATION IR SOLN
Status: DC | PRN
  Administered 2022-06-27: 150 mL

## 2022-06-27 MED ORDER — SODIUM CHLORIDE 0.9 % IV SOLN: INTRAVENOUS | Status: DC

## 2022-06-27 MED ORDER — LIDOCAINE HCL (CARDIAC) PF 100 MG/5ML IV SOSY
PREFILLED_SYRINGE | INTRAVENOUS | Status: DC | PRN
  Administered 2022-06-27: 80 mg via INTRAVENOUS

## 2022-06-27 SURGICAL SUPPLY — 37 items
BALLN DILATOR 12-15 8 (BALLOONS)
BALLN DILATOR 15-18 8 (BALLOONS)
BALLN DILATOR CRE 0-12 8 (BALLOONS)
BALLN DILATOR ESOPH 8 10 CRE (MISCELLANEOUS) IMPLANT
BALLOON DILATOR 12-15 8 (BALLOONS) IMPLANT
BALLOON DILATOR 15-18 8 (BALLOONS) IMPLANT
BALLOON DILATOR CRE 0-12 8 (BALLOONS) IMPLANT
BLOCK BITE 60FR ADLT L/F GRN (MISCELLANEOUS) ×2 IMPLANT
BRUSH CYTO GASTROSCOPE 3.0 (MISCELLANEOUS) IMPLANT
CLIP HMST 235XBRD CATH ROT (MISCELLANEOUS) IMPLANT
CLIP RESOLUTION 360 11X235 (MISCELLANEOUS)
ELECT REM PT RETURN 9FT ADLT (ELECTROSURGICAL)
ELECTRODE REM PT RTRN 9FT ADLT (ELECTROSURGICAL) IMPLANT
FCP ESCP3.2XJMB 240X2.8X (MISCELLANEOUS)
FORCEPS BIOP RAD 4 LRG CAP 4 (CUTTING FORCEPS) IMPLANT
FORCEPS BIOP RJ4 240 W/NDL (MISCELLANEOUS)
FORCEPS ESCP3.2XJMB 240X2.8X (MISCELLANEOUS) IMPLANT
GOWN CVR UNV OPN BCK APRN NK (MISCELLANEOUS) ×8 IMPLANT
GOWN ISOL THUMB LOOP REG UNIV (MISCELLANEOUS) ×8
INJECTOR VARIJECT VIN23 (MISCELLANEOUS) IMPLANT
KIT DEFENDO VALVE AND CONN (KITS) IMPLANT
KIT PRC NS LF DISP ENDO (KITS) ×4 IMPLANT
KIT PROCEDURE OLYMPUS (KITS) ×4
MANIFOLD NEPTUNE II (INSTRUMENTS) ×4 IMPLANT
MARKER SPOT ENDO TATTOO 5ML (MISCELLANEOUS) IMPLANT
PROBE APC STR FIRE (PROBE) IMPLANT
RETRIEVER NET PLAT FOOD (MISCELLANEOUS) IMPLANT
RETRIEVER NET ROTH 2.5X230 LF (MISCELLANEOUS) IMPLANT
SNARE COLD EXACTO (MISCELLANEOUS) IMPLANT
SNARE SHORT THROW 13M SML OVAL (MISCELLANEOUS) IMPLANT
SNARE SHORT THROW 30M LRG OVAL (MISCELLANEOUS) IMPLANT
SNARE SNG USE RND 15MM (INSTRUMENTS) IMPLANT
SYR INFLATION 60ML (SYRINGE) IMPLANT
TRAP ETRAP POLY (MISCELLANEOUS) IMPLANT
VARIJECT INJECTOR VIN23 (MISCELLANEOUS) ×2
WATER STERILE IRR 250ML POUR (IV SOLUTION) ×4 IMPLANT
WIRE CRE 18-20MM 8CM F G (MISCELLANEOUS) IMPLANT

## 2022-06-27 NOTE — Anesthesia Postprocedure Evaluation (Signed)
Anesthesia Post Note  Patient: Kristin Miller  Procedure(s) Performed: COLONOSCOPY WITH PROPOFOL (Rectum) ESOPHAGOGASTRODUODENOSCOPY (EGD) WITH PROPOFOL (Mouth)  Patient location during evaluation: PACU Anesthesia Type: General Level of consciousness: awake and alert Pain management: pain level controlled Vital Signs Assessment: post-procedure vital signs reviewed and stable Respiratory status: spontaneous breathing, nonlabored ventilation, respiratory function stable and patient connected to nasal cannula oxygen Cardiovascular status: blood pressure returned to baseline and stable Postop Assessment: no apparent nausea or vomiting Anesthetic complications: no   No notable events documented.   Last Vitals:  Vitals:   06/27/22 1145 06/27/22 1200  BP: 125/60 126/63  Pulse: 73 71  Resp: (!) 34 (!) 29  Temp: 36.6 C   SpO2: 95% 95%    Last Pain:  Vitals:   06/27/22 1200  TempSrc:   PainSc: 0-No pain                 Martha Clan

## 2022-06-27 NOTE — Anesthesia Preprocedure Evaluation (Signed)
Anesthesia Evaluation  Patient identified by MRN, date of birth, ID band Patient awake    Reviewed: Allergy & Precautions, H&P , NPO status , Patient's Chart, lab work & pertinent test results, reviewed documented beta blocker date and time   History of Anesthesia Complications Negative for: history of anesthetic complications  Airway Mallampati: II  TM Distance: >3 FB Neck ROM: full    Dental  (+) Upper Dentures, Edentulous Upper, Dental Advidsory Given, Partial Lower   Pulmonary neg pulmonary ROS   Pulmonary exam normal breath sounds clear to auscultation       Cardiovascular Exercise Tolerance: Good hypertension, (-) angina (-) Past MI and (-) Cardiac Stents Normal cardiovascular exam+ dysrhythmias (-) Valvular Problems/Murmurs Rhythm:regular Rate:Normal     Neuro/Psych negative neurological ROS  negative psych ROS   GI/Hepatic negative GI ROS, Neg liver ROS,,,  Endo/Other  negative endocrine ROS    Renal/GU negative Renal ROS  negative genitourinary   Musculoskeletal   Abdominal   Peds  Hematology negative hematology ROS (+)   Anesthesia Other Findings Past Medical History: No date: Allergy No date: Anemia     Comment:  H/O AS A CHILD No date: Arthritis No date: Back pain 06/12/2015: Breast cancer of upper-outer quadrant of right female  breast ([place])     Comment:  4 millimeter, T1a, N0; ER 90%, PR 0%, HER-2/neu not               overexpressed.  Declined radiation therapy. 05/28/2013: Collar bone fracture     Comment:  Fractured Collar bone on left No date: Dysrhythmia     Comment:  h/o heart skipping beat years ago No date: Frequent headaches     Comment:  h/o migraines No date: Hypertension 04/2018: Osteopenia     Comment:  Bone density without significant interval change since               2017.   Reproductive/Obstetrics negative OB ROS                              Anesthesia Physical Anesthesia Plan  ASA: 2  Anesthesia Plan: General   Post-op Pain Management:    Induction: Intravenous  PONV Risk Score and Plan: 3 and Propofol infusion, TIVA and Treatment may vary due to age or medical condition  Airway Management Planned: Natural Airway and Nasal Cannula  Additional Equipment:   Intra-op Plan:   Post-operative Plan:   Informed Consent: I have reviewed the patients History and Physical, chart, labs and discussed the procedure including the risks, benefits and alternatives for the proposed anesthesia with the patient or authorized representative who has indicated his/her understanding and acceptance.     Dental Advisory Given  Plan Discussed with: Anesthesiologist, CRNA and Surgeon  Anesthesia Plan Comments:        Anesthesia Quick Evaluation

## 2022-06-27 NOTE — Transfer of Care (Signed)
Immediate Anesthesia Transfer of Care Note  Patient: Kristin Miller  Procedure(s) Performed: COLONOSCOPY WITH PROPOFOL (Rectum) ESOPHAGOGASTRODUODENOSCOPY (EGD) WITH PROPOFOL (Mouth)  Patient Location: PACU  Anesthesia Type: General  Level of Consciousness: awake, alert  and patient cooperative  Airway and Oxygen Therapy: Patient Spontanous Breathing and Patient connected to supplemental oxygen  Post-op Assessment: Post-op Vital signs reviewed, Patient's Cardiovascular Status Stable, Respiratory Function Stable, Patent Airway and No signs of Nausea or vomiting  Post-op Vital Signs: Reviewed and stable  Complications: No notable events documented.

## 2022-06-27 NOTE — Op Note (Signed)
[facility] Gastroenterology Patient Name: Kristin Miller Procedure Date: 06/27/2022 11:05 AM MRN: [id number] Account #: [id number] Date of Birth: 1935-10-07 Admit Type: Outpatient Age: 86 Room: [facility] OR ROOM 01 Gender: Female Note Status: Finalized Instrument Name: [id number] Procedure:             Upper GI endoscopy Indications:           Iron deficiency anemia Providers:             [name] MD, MD Referring MD:          Lorna Few (Referring MD) Medicines:             Propofol per Anesthesia Complications:         No immediate complications. Procedure:             Pre-Anesthesia Assessment:                        - Prior to the procedure, a History and Physical was                         performed, and patient medications and allergies were                         reviewed. The patient's tolerance of previous                         anesthesia was also reviewed. The risks and benefits                         of the procedure and the sedation options and risks                         were discussed with the patient. All questions were                         answered, and informed consent was obtained. Prior                         Anticoagulants: The patient has taken no anticoagulant                         or antiplatelet agents. ASA Grade Assessment: II - A                         patient with mild systemic disease. After reviewing                         the risks and benefits, the patient was deemed in                         satisfactory condition to undergo the procedure.                        After obtaining informed consent, the endoscope was                         passed under direct vision. Throughout the procedure,  the patient's blood pressure, pulse, and oxygen                         saturations were monitored continuously. The Endoscope                         was introduced through the mouth, and advanced to the                          second part of duodenum. The upper GI endoscopy was                         accomplished without difficulty. The patient tolerated                         the procedure well. Findings:      A large hiatal hernia was present.      Diffuse, white plaques were found in the entire esophagus. Cells for       cytology were obtained by brushing.      The stomach was normal.      The examined duodenum was normal. Impression:            - Large hiatal hernia.                        - Esophageal plaques were found, suspicious for                         candidiasis. Cells for cytology obtained.                        - Normal stomach.                        - Normal examined duodenum. Recommendation:        - Discharge patient to home.                        - Resume previous diet.                        - Continue present medications.                        - Await pathology results.                        - Perform a colonoscopy today. Procedure Code(s):     --- Professional ---                        [phone], Esophagogastroduodenoscopy, flexible,                         transoral; diagnostic, including collection of                         specimen(s) by brushing or washing, when performed                         (separate procedure) Diagnosis Code(s):     --- Professional ---  D50.9, Iron deficiency anemia, unspecified                        K22.9, Disease of esophagus, unspecified CPT copyright 2022 American Medical Association. All rights reserved. The codes documented in this report are preliminary and upon coder review may  be revised to meet current compliance requirements. [name] MD, MD 06/27/2022 11:24:24 AM This report has been signed electronically. Number of Addenda: 0 Note Initiated On: 06/27/2022 11:05 AM Total Procedure Duration: 0 hours 3 minutes 34 seconds  Estimated Blood Loss:  Estimated blood loss: none.      [facility]

## 2022-06-27 NOTE — Op Note (Signed)
[facility] Gastroenterology Patient Name: Kristin Miller Procedure Date: 06/27/2022 11:03 AM MRN: [id number] Account #: [id number] Date of Birth: [birth year] Admit Type: Outpatient Age: 86 Room: [facility] OR ROOM 01 Gender: Female Note Status: Finalized Instrument Name: Peds Colonoscope [id number] Procedure:             Colonoscopy Indications:           Iron deficiency anemia Providers:             [name] MD, MD Referring MD:          Lorna Few (Referring MD) Medicines:             Propofol per Anesthesia Complications:         No immediate complications. Procedure:             Pre-Anesthesia Assessment:                        - Prior to the procedure, a History and Physical was                         performed, and patient medications and allergies were                         reviewed. The patient's tolerance of previous                         anesthesia was also reviewed. The risks and benefits                         of the procedure and the sedation options and risks                         were discussed with the patient. All questions were                         answered, and informed consent was obtained. Prior                         Anticoagulants: The patient has taken no anticoagulant                         or antiplatelet agents. ASA Grade Assessment: II - A                         patient with mild systemic disease. After reviewing                         the risks and benefits, the patient was deemed in                         satisfactory condition to undergo the procedure.                        After obtaining informed consent, the colonoscope was                         passed under direct vision. Throughout the procedure,  the patient's blood pressure, pulse, and oxygen                         saturations were monitored continuously. The                         Colonoscope was introduced through the anus and                          advanced to the the descending colon to examine a                         mass. This was the intended extent. The colonoscopy                         was performed without difficulty. The patient                         tolerated the procedure well. The quality of the bowel                         preparation was good. Findings:      The perianal and digital rectal examinations were normal.      A fungating partially obstructing large mass was found in the descending       colon. The mass was circumferential. Oozing was present. This was       biopsied with a cold forceps for histology. Area was successfully       injected with 4 mL Spot (carbon black) for tattooing. 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 Biopsied. Injected. Recommendation:        - Discharge patient to home.                        - Resume previous diet.                        - Await pathology results.                        - Refer to a Psychologist, sport and exercise. Procedure Code(s):     --- Professional ---                        [phone], 59, Colonoscopy, flexible; with biopsy, single                         or multiple                        [id number], 52, Colonoscopy, flexible; with directed                         submucosal injection(s), any substance Diagnosis Code(s):     --- Professional ---                        D50.9, Iron deficiency  anemia, unspecified                        D49.0, Neoplasm of unspecified behavior of digestive                         system CPT copyright 2022 American Medical Association. All rights reserved. The codes documented in this report are preliminary and upon coder review may  be revised to meet current compliance requirements. [name] MD, MD 06/27/2022 11:45:33 AM This report has been signed electronically. Number of Addenda: 0 Note Initiated On: 06/27/2022 11:03 AM Scope Withdrawal Time: 0 hours 3  minutes 3 seconds  Total Procedure Duration: 0 hours 16 minutes 51 seconds  Estimated Blood Loss:  Estimated blood loss: none.      [facility]

## 2022-06-27 NOTE — H&P (Signed)
Kristin Lame, MD [address] [place], [place] [address] [phone] Fax : [phone]  Primary Care Physician:  Lorna Few, DO Primary Gastroenterologist:  Dr. Allen Norris  Pre-Procedure History & Physical: HPI:  Kristin Miller is a 86 y.o. female is here for an endoscopy and colonoscopy.   Past Medical History:  Diagnosis Date   Allergy    Anemia    H/O AS A CHILD   Arthritis    Back pain    Breast cancer of upper-outer quadrant of right female breast ([place]) 06/12/2015   4 millimeter, T1a, N0; ER 90%, PR 0%, HER-2/neu not overexpressed.  Declined radiation therapy.   Collar bone fracture 05/28/2013   Fractured Collar bone on left   Dysrhythmia    h/o heart skipping beat years ago   Frequent headaches    h/o migraines   Hypertension    Osteopenia 04/2018   Bone density without significant interval change since 2017.    Past Surgical History:  Procedure Laterality Date   ABDOMINAL HYSTERECTOMY  1970   Partial   BREAST BIOPSY Right 05/28/2015   INVASIVE MAMMARY CARCINOMA    BREAST BIOPSY Right 06/12/2015   Wide excision, sentinel lymph node biopsy.   BREAST LUMPECTOMY WITH AXILLARY LYMPH NODE DISSECTION Right 06/12/2015   4 mm, T1a,N0 withDCIS with clear margins. ER: 90%, PR 0%; Her 2 neu not overexpressed.    CATARACT EXTRACTION W/PHACO Left 02/03/2017   Procedure: CATARACT EXTRACTION PHACO AND INTRAOCULAR LENS PLACEMENT (IOC);  Surgeon: Birder Robson, MD;  Location: [facility] ORS;  Service: Ophthalmology;  Laterality: Left;  [place]  00:28 AP% 16.3 CDE 4.68 Fluid pack lot # [id number]   CATARACT EXTRACTION W/PHACO Right 03/01/2019   Procedure: CATARACT EXTRACTION PHACO AND INTRAOCULAR LENS PLACEMENT ([place])  RIGHT;  Surgeon: Birder Robson, MD;  Location: [place];  Service: Ophthalmology;  Laterality: Right;   COLONOSCOPY      Prior to Admission medications   Medication Sig Start Date End Date Taking? Authorizing Provider   acetaminophen (TYLENOL) 500 MG tablet Take 500 mg by mouth every 6 (six) hours as needed for pain.   Yes [provider]  Calcium-Vitamin D (CALTRATE 600 PLUS-VIT D PO) Take 2 tablets by mouth daily.   Yes [provider]  latanoprost (XALATAN) 0.005 % ophthalmic solution 1 drop at bedtime. Eye doctor 12/18/20  Yes [provider]  losartan-hydrochlorothiazide (HYZAAR) 100-12.5 MG tablet Take 1 tablet by mouth daily. 01/30/22  Yes Juline Patch, MD  ferrous sulfate 325 (65 FE) MG EC tablet Take 1 tablet by mouth daily with breakfast. Patient not taking: Reported on 06/23/2022 05/16/22 05/16/23  [provider]  mometasone (NASONEX) 50 MCG/ACT nasal spray     [provider]  montelukast (SINGULAIR) 10 MG tablet     [provider]  Multiple Vitamins-Minerals (CENTRUM SILVER ULTRA WOMENS PO) Take 1 tablet by mouth daily. Patient not taking: Reported on 06/18/2022    [provider]  triamcinolone (NASACORT) 55 MCG/ACT AERO nasal inhaler Place 2 sprays into the nose daily. Patient not taking: Reported on 06/23/2022 08/02/21   Juline Patch, MD    Allergies as of 06/18/2022 - Review Complete 06/18/2022  Allergen Reaction Noted   Combigan [brimonidine tartrate-timolol]  01/30/2021   Lisinopril Cough 08/22/2020   Other Rash 06/06/2015    Family History  Problem Relation Age of Onset   Mental illness Mother        Dementia   Stroke Father  Cancer Brother        Lung Cancer   Breast cancer Other     Social History   Socioeconomic History   Marital status: Married    Spouse name: Not on file   Number of children: 3   Years of education: GED   Highest education level: 12th grade  Occupational History   Occupation: Retired  Tobacco Use   Smoking status: Never   Smokeless tobacco: Never   Tobacco comments:    smoking cessation materials not required  Vaping Use   Vaping Use: Never used  Substance and Sexual  Activity   Alcohol use: No   Drug use: No   Sexual activity: Not Currently  Other Topics Concern   Not on file  Social History Narrative   Not on file   Social Determinants of Health   Financial Resource Strain: Low Risk  (09/25/2021)   Overall Financial Resource Strain (CARDIA)    Difficulty of Paying Living Expenses: Not hard at all  Food Insecurity: No Food Insecurity (09/25/2021)   Hunger Vital Sign    Worried About Running Out of Food in the Last Year: Never true    Ran Out of Food in the Last Year: Never true  Transportation Needs: No Transportation Needs (09/25/2021)   PRAPARE - Hydrologist (Medical): No    Lack of Transportation (Non-Medical): No  Physical Activity: Inactive (09/25/2021)   Exercise Vital Sign    Days of Exercise per Week: 0 days    Minutes of Exercise per Session: 0 min  Stress: No Stress Concern Present (09/25/2021)   [place]    Feeling of Stress : Not at all  Social Connections: Moderately Integrated (09/25/2021)   Social Connection and Isolation Panel [NHANES]    Frequency of Communication with Friends and Family: More than three times a week    Frequency of Social Gatherings with Friends and Family: More than three times a week    Attends Religious Services: More than 4 times per year    Active Member of [employer] or Organizations: No    Attends Archivist Meetings: Never    Marital Status: Married  Human resources officer Violence: Not At Risk (09/25/2021)   Humiliation, Afraid, Rape, and Kick questionnaire    Fear of Current or Ex-Partner: No    Emotionally Abused: No    Physically Abused: No    Sexually Abused: No    Review of Systems: See HPI, otherwise negative ROS  Physical Exam: BP (!) 156/84   Temp 99.7 F (37.6 C) (Tympanic)   Ht 5' (1.524 m)   Wt 57.6 kg   SpO2 99%   BMI 24.80 kg/m  General:   Alert,  pleasant and cooperative in NAD Head:   Normocephalic and atraumatic. Neck:  Supple; no masses or thyromegaly. Lungs:  Clear throughout to auscultation.    Heart:  Regular rate and rhythm. Abdomen:  Soft, nontender and nondistended. Normal bowel sounds, without guarding, and without rebound.   Neurologic:  Alert and  oriented x4;  grossly normal neurologically.  Impression/Plan: Kristin Miller is here for an endoscopy and colonoscopy to be performed for IDA  Risks, benefits, limitations, and alternatives regarding  endoscopy and colonoscopy have been reviewed with the patient.  Questions have been answered.  All parties agreeable.   Kristin Lame, MD  06/27/2022, 11:03 AM

## 2022-06-30 ENCOUNTER — Ambulatory Visit (INDEPENDENT_AMBULATORY_CARE_PROVIDER_SITE_OTHER): Payer: Medicare HMO | Admitting: Surgery

## 2022-06-30 ENCOUNTER — Encounter: Payer: Self-pay | Admitting: Gastroenterology

## 2022-06-30 VITALS — BP 152/84 | HR 109 | Temp 99.0°F | Ht 60.0 in | Wt 130.6 lb

## 2022-06-30 DIAGNOSIS — D649 Anemia, unspecified: Secondary | ICD-10-CM

## 2022-06-30 DIAGNOSIS — C189 Malignant neoplasm of colon, unspecified: Secondary | ICD-10-CM

## 2022-06-30 NOTE — Patient Instructions (Addendum)
Your CT is scheduled for 07/03/2022 at 4 pm (arrive by 1:45 pm to drink the contrast) @ [facility] [address]. Nothing to eat or drink 4 hours prior.    A referral has been placed to Hematology. They will call you with an appointment.    If you have any concerns or questions, please feel free to call our office.    CT Scan, Adult  A CT scan (computed tomography scan) is an imaging scan. It uses X-rays and a computer to make detailed pictures of different areas inside the body. A CT scan can give more information than a regular X-ray exam. A CT scan provides data about internal organs, soft tissue structures, blood vessels, and bones. In this procedure, the pictures will be taken in a large machine that has an opening (CT scanner). Depending on the type of exam, a substance called contrast dye may be used to help see the area in the body that is being checked. The contrast may be swallowed, injected through an IV, or in some cases, given by an enema. An enema is when the dye is put into your colon through your anus. Tell a health care provider about: Any allergies you have. All medicines you are taking, including vitamins, herbs, eye drops, creams, and over-the-counter medicines. Any bleeding problems you have. Any surgeries you have had. Any medical conditions you have or recent illnesses. Whether you are pregnant or may be pregnant. What are the risks? Your health care provider will talk to you about risks. These may include: An allergic reaction to the dye used during the procedure. Risk of cancer from too much exposure to radiation from multiple CT scans. This is rare. What happens before the procedure? Follow instructions from your health care provider about what you may eat and drink. If contrast dye will be used for the CT scan, you may need to stop eating and drinking for a few hours before the procedure. Ask your health care provider about: Changing or stopping  your regular medicines. These include any diabetes medicines or blood thinners you take. Taking over-the-counter medicines, vitamins, herbs, and supplements. Remove any jewelry or metal objects. Wear loose, comfortable clothing. You may be asked to change into a hospital gown. What happens during the procedure? An IV tube may be inserted into one of your veins. The contrast dye will be given, if needed. You will lie on a table. Pillows and straps may be used to position you. The table you will be lying on will move into the CT scanner. You will be able to see, hear, and talk to the person running the machine while you are in it. Follow that person's instructions. You may be asked to hold your breath. The CT scanner will move around you to take pictures. Do not move while it is scanning. Staying still helps the scanner to get a good image. When the best possible pictures have been taken, the machine will be turned off. The table will be moved out of the machine. The IV tube will be removed. The procedure may vary among health care providers and hospitals. What can I expect after the procedure? It is up to you to get the results of your procedure. Ask your health care provider, or the department that is doing the procedure, when your results will be ready. If contrast dye was used you may: Be asked to drink fluids. Feel warm, have a metallic taste in your mouth, or feel the need  to urinate, if the dye was given by IV. Have some stomach discomfort if the dye was swallowed or given by enema. Where to find more information [employer] of Radiology, & Radiological Society of [place]: www.radiologyinfo.org Summary A CT scan is an imaging scan. A CT scan uses X-rays and a computer to make detailed pictures of different areas inside your body. Follow instructions from your health care provider about what you may eat and drink. Depending on the type of exam, a substance called contrast  dye may be used to help see the area of the body being checked. You will be able to see, hear, and talk to the person running the machine while you are in it. Follow that person's instructions. This information is not intended to replace advice given to you by your health care provider. Make sure you discuss any questions you have with your health care provider. Document Revised: 10/11/2021 Document Reviewed: 10/11/2021 Elsevier Patient Education  [place].

## 2022-07-01 LAB — SURGICAL PATHOLOGY

## 2022-07-02 LAB — CYTOLOGY - NON PAP

## 2022-07-03 ENCOUNTER — Other Ambulatory Visit: Payer: Self-pay

## 2022-07-03 ENCOUNTER — Ambulatory Visit
Admission: RE | Admit: 2022-07-03 | Discharge: 2022-07-03 | Disposition: A | Discharge status: Home / Self Care | Payer: Medicare HMO | Source: Ambulatory Visit | Attending: Surgery | Admitting: Surgery

## 2022-07-03 ENCOUNTER — Ambulatory Visit
Admission: RE | Admit: 2022-07-03 | Discharge: 2022-07-03 | Disposition: A | Discharge status: Home / Self Care | Payer: Medicare HMO | Source: Ambulatory Visit | Attending: General Surgery | Admitting: General Surgery

## 2022-07-03 DIAGNOSIS — M810 Age-related osteoporosis without current pathological fracture: Secondary | ICD-10-CM | POA: Diagnosis not present

## 2022-07-03 DIAGNOSIS — Z780 Asymptomatic menopausal state: Secondary | ICD-10-CM | POA: Diagnosis not present

## 2022-07-03 DIAGNOSIS — Z1231 Encounter for screening mammogram for malignant neoplasm of breast: Secondary | ICD-10-CM | POA: Insufficient documentation

## 2022-07-03 DIAGNOSIS — Z853 Personal history of malignant neoplasm of breast: Secondary | ICD-10-CM | POA: Diagnosis not present

## 2022-07-03 DIAGNOSIS — Z13820 Encounter for screening for osteoporosis: Secondary | ICD-10-CM | POA: Diagnosis present

## 2022-07-03 DIAGNOSIS — C189 Malignant neoplasm of colon, unspecified: Secondary | ICD-10-CM | POA: Insufficient documentation

## 2022-07-03 DIAGNOSIS — C50511 Malignant neoplasm of lower-outer quadrant of right female breast: Secondary | ICD-10-CM

## 2022-07-03 LAB — POCT I-STAT CREATININE: Creatinine, Ser: 0.6 mg/dL (ref 0.44–1.00)

## 2022-07-03 MED ORDER — IOHEXOL 300 MG/ML  SOLN
75.0000 mL | Freq: Once | INTRAMUSCULAR | Status: AC | PRN
  Administered 2022-07-03: 75 mL via INTRAVENOUS

## 2022-07-03 MED ORDER — FLUCONAZOLE 100 MG PO TABS: 100.0000 mg | ORAL_TABLET | Freq: Every day | ORAL | 0 refills | Status: DC

## 2022-07-04 ENCOUNTER — Encounter: Payer: Self-pay | Admitting: Surgery

## 2022-07-04 ENCOUNTER — Inpatient Hospital Stay: Payer: Medicare HMO | Attending: Oncology | Admitting: Oncology

## 2022-07-04 ENCOUNTER — Inpatient Hospital Stay: Payer: Medicare HMO

## 2022-07-04 ENCOUNTER — Encounter: Payer: Self-pay | Admitting: Oncology

## 2022-07-04 VITALS — BP 150/79 | HR 102 | Temp 99.7°F | Wt 129.1 lb

## 2022-07-04 DIAGNOSIS — Z7189 Other specified counseling: Secondary | ICD-10-CM

## 2022-07-04 DIAGNOSIS — C189 Malignant neoplasm of colon, unspecified: Secondary | ICD-10-CM | POA: Insufficient documentation

## 2022-07-04 DIAGNOSIS — C186 Malignant neoplasm of descending colon: Secondary | ICD-10-CM | POA: Diagnosis present

## 2022-07-04 DIAGNOSIS — D509 Iron deficiency anemia, unspecified: Secondary | ICD-10-CM | POA: Insufficient documentation

## 2022-07-04 DIAGNOSIS — D500 Iron deficiency anemia secondary to blood loss (chronic): Secondary | ICD-10-CM

## 2022-07-04 DIAGNOSIS — E46 Unspecified protein-calorie malnutrition: Secondary | ICD-10-CM | POA: Diagnosis not present

## 2022-07-04 DIAGNOSIS — E440 Moderate protein-calorie malnutrition: Secondary | ICD-10-CM

## 2022-07-04 LAB — COMPREHENSIVE METABOLIC PANEL
ALT: 9 U/L (ref 0–44)
AST: 17 U/L (ref 15–41)
Albumin: 2.9 g/dL — ABNORMAL LOW (ref 3.5–5.0)
Alkaline Phosphatase: 66 U/L (ref 38–126)
Anion gap: 13 (ref 5–15)
BUN: 10 mg/dL (ref 8–23)
CO2: 25 mmol/L (ref 22–32)
Calcium: 8.8 mg/dL — ABNORMAL LOW (ref 8.9–10.3)
Chloride: 97 mmol/L — ABNORMAL LOW (ref 98–111)
Creatinine, Ser: 0.72 mg/dL (ref 0.44–1.00)
GFR, Estimated: >60 mL/min (ref >60)
Glucose, Bld: 125 mg/dL — ABNORMAL HIGH (ref 70–99)
Potassium: 3.5 mmol/L (ref 3.5–5.1)
Sodium: 135 mmol/L (ref 135–145)
Total Bilirubin: 0.4 mg/dL (ref 0.3–1.2)
Total Protein: 7.5 g/dL (ref 6.5–8.1)

## 2022-07-04 LAB — CBC WITH DIFFERENTIAL/PLATELET
Abs Immature Granulocytes: 0.12 10*3/uL — ABNORMAL HIGH (ref 0.00–0.07)
Basophils Absolute: 0.2 10*3/uL — ABNORMAL HIGH (ref 0.0–0.1)
Basophils Relative: 1 %
Eosinophils Absolute: 0.1 10*3/uL (ref 0.0–0.5)
Eosinophils Relative: 1 %
HCT: 31.5 % — ABNORMAL LOW (ref 36.0–46.0)
Hemoglobin: 9.4 g/dL — ABNORMAL LOW (ref 12.0–15.0)
Immature Granulocytes: 1 %
Lymphocytes Relative: 15 %
Lymphs Abs: 2.3 10*3/uL (ref 0.7–4.0)
MCH: 22.4 pg — ABNORMAL LOW (ref 26.0–34.0)
MCHC: 29.8 g/dL — ABNORMAL LOW (ref 30.0–36.0)
MCV: 75 fL — ABNORMAL LOW (ref 80.0–100.0)
Monocytes Absolute: 1.5 10*3/uL — ABNORMAL HIGH (ref 0.1–1.0)
Monocytes Relative: 9 %
Neutro Abs: 11.5 10*3/uL — ABNORMAL HIGH (ref 1.7–7.7)
Neutrophils Relative %: 73 %
Platelets: 562 10*3/uL — ABNORMAL HIGH (ref 150–400)
RBC: 4.2 MIL/uL (ref 3.87–5.11)
RDW: 16.7 % — ABNORMAL HIGH (ref 11.5–15.5)
WBC: 15.7 10*3/uL — ABNORMAL HIGH (ref 4.0–10.5)
nRBC: 0 % (ref 0.0–0.2)

## 2022-07-04 LAB — RETIC PANEL
Immature Retic Fract: 14.2 % (ref 2.3–15.9)
RBC.: 4.18 MIL/uL (ref 3.87–5.11)
Retic Count, Absolute: 67.7 10*3/uL (ref 19.0–186.0)
Retic Ct Pct: 1.6 % (ref 0.4–3.1)
Reticulocyte Hemoglobin: 24.2 pg — ABNORMAL LOW (ref >27.9)

## 2022-07-04 LAB — IRON AND TIBC
Iron: 18 ug/dL — ABNORMAL LOW (ref 28–170)
Saturation Ratios: 7 % — ABNORMAL LOW (ref 10.4–31.8)
TIBC: 248 ug/dL — ABNORMAL LOW (ref 250–450)
UIBC: 230 ug/dL

## 2022-07-04 LAB — FERRITIN: Ferritin: 59 ng/mL (ref 11–307)

## 2022-07-04 NOTE — Progress Notes (Signed)
Patient ID: Kristin Miller, female   DOB: Nov 02, 1935, 86 y.o.   MRN: [id number]  HPI Kristin Miller is a 86 y.o. female seen in consultation at the request of Dr.Wohl.  She underwent a recent colonoscopy for anemia and weakness and there was evidence of a descending large colon mass near obstruction.  Please note that I personally reviewed the images.  Also the pathology shows evidence of adenocarcinoma.  She denies any melena or hematochezia.  He endorses some weakness as well as some loose bowel movements. She also states that over the last few months her health has declined. CBC shows anemia with a hemoglobin of 10.7 white count of 13.7 weightless of 485.  MP shows hypovolemia albumin of 2.8 of 0.74 She comes accompanied by her husband SHe has lost about 15 pounds for the last 6 months or so. She does have a history of prior hysterectomy and breast cancer as well.  Did undergo a right lumpectomy for cancer. He also had an EGD showing a large hiatal hernia.  HPI  Past Medical History:  Diagnosis Date   Allergy    Anemia    H/O AS A CHILD   Arthritis    Back pain    Breast cancer of upper-outer quadrant of right female breast ([place]) 06/12/2015   4 millimeter, T1a, N0; ER 90%, PR 0%, HER-2/neu not overexpressed.  Declined radiation therapy.   Collar bone fracture 05/28/2013   Fractured Collar bone on left   Dysrhythmia    h/o heart skipping beat years ago   Frequent headaches    h/o migraines   Hypertension    Osteopenia 04/2018   Bone density without significant interval change since 2017.    Past Surgical History:  Procedure Laterality Date   ABDOMINAL HYSTERECTOMY  1970   Partial   BREAST BIOPSY Right 05/28/2015   INVASIVE MAMMARY CARCINOMA    BREAST BIOPSY Right 06/12/2015   Wide excision, sentinel lymph node biopsy.   BREAST LUMPECTOMY WITH AXILLARY LYMPH NODE DISSECTION Right 06/12/2015   4 mm, T1a,N0 withDCIS with clear margins. ER: 90%, PR 0%; Her 2 neu not  overexpressed.    CATARACT EXTRACTION W/PHACO Left 02/03/2017   Procedure: CATARACT EXTRACTION PHACO AND INTRAOCULAR LENS PLACEMENT ([facility]);  Surgeon: Birder Robson, MD;  Location: [facility];  Service: Ophthalmology;  Laterality: Left;  [place]  00:28 AP% 16.3 CDE 4.68 Fluid pack lot # [id number]   CATARACT EXTRACTION W/PHACO Right 03/01/2019   Procedure: CATARACT EXTRACTION PHACO AND INTRAOCULAR LENS PLACEMENT ([place])  RIGHT;  Surgeon: Birder Robson, MD;  Location: [place];  Service: Ophthalmology;  Laterality: Right;   COLONOSCOPY     COLONOSCOPY WITH PROPOFOL N/A 06/27/2022   Procedure: COLONOSCOPY WITH PROPOFOL;  Surgeon: [name], MD;  Location: [place];  Service: Endoscopy;  Laterality: N/A;   ESOPHAGOGASTRODUODENOSCOPY (EGD) WITH PROPOFOL N/A 06/27/2022   Procedure: ESOPHAGOGASTRODUODENOSCOPY (EGD) WITH PROPOFOL;  Surgeon: [name], MD;  Location: [place];  Service: Endoscopy;  Laterality: N/A;    Family History  Problem Relation Age of Onset   Mental illness Mother        Dementia   Stroke Father    Cancer Brother        Lung Cancer   Breast cancer Other     Social History Social History   Tobacco Use   Smoking status: Never   Smokeless tobacco: Never   Tobacco comments:    smoking cessation materials not required  Vaping Use  Vaping Use: Never used  Substance Use Topics   Alcohol use: No   Drug use: No    Allergies  Allergen Reactions   Combigan [Brimonidine Tartrate-Timolol]     Redness, burning, irritation    Lisinopril Cough   Other Rash    SUTURES-(PARTIAL HYSTERECTOMY)    Current Outpatient Medications  Medication Sig Dispense Refill   fluconazole (DIFLUCAN) 100 MG tablet Take 1 tablet (100 mg total) by mouth daily. 10 tablet 0   latanoprost (XALATAN) 0.005 % ophthalmic solution 1 drop at bedtime. Eye doctor     losartan-hydrochlorothiazide (HYZAAR) 100-12.5 MG tablet Take 1 tablet by mouth daily. 90 tablet 1    acetaminophen (TYLENOL) 500 MG tablet Take 500 mg by mouth every 6 (six) hours as needed for pain.     Calcium-Vitamin D (CALTRATE 600 PLUS-VIT D PO) Take 2 tablets by mouth daily. (Patient not taking: Reported on 06/30/2022)     ferrous sulfate 325 (65 FE) MG EC tablet Take 1 tablet by mouth daily with breakfast. (Patient not taking: Reported on 06/23/2022)     No current facility-administered medications for this visit.     Review of Systems Full ROS  was asked and was negative except for the information on the HPI  Physical Exam Blood pressure (!) 152/84, pulse (!) 109, temperature 99 F (37.2 C), temperature source Oral, height 5' (1.524 m), weight 130 lb 9.6 oz (59.2 kg), SpO2 96 %. CONSTITUTIONAL: She is somewhat frail. EYES: Pupils are equal, round, , Sclera are non-icteric. EARS, NOSE, MOUTH AND THROAT: The oropharynx is clear. The oral mucosa is pink and moist. Hearing is intact to voice. LYMPH NODES:  Lymph nodes in the neck are normal. RESPIRATORY:  Lungs are clear. There is normal respiratory effort, with equal breath sounds bilaterally, and without pathologic use of accessory muscles. CARDIOVASCULAR: Heart is regular without murmurs, gallops, or rubs. GI: The abdomen is soft, nontender, and nondistended. There are no palpable masses. There is no hepatosplenomegaly. There are normal bowel sounds in all quadrants. GU: Rectal deferred.   MUSCULOSKELETAL: Normal muscle strength and tone. No cyanosis or edema.   SKIN: Turgor is good and there are no pathologic skin lesions or ulcers. NEUROLOGIC: Motor and sensation is grossly normal. Cranial nerves are grossly intact. PSYCH:  Oriented to person, place and time. Affect is normal.  Data Reviewed  I have personally reviewed the patient's imaging, laboratory findings and medical records.    Assessment/Plan 86 year old female with recently diagnosed ascending colon cancer that is large.  Discussed with the patient in detail about my  thought process.  Typical management of colon cancer will likely be multi monotherapy.  Question will become if she is resectable or not.  We Will obtain appropriate workup with a CT scan of the abdomen and pelvis as well as a CT of the chest to further try to stay sure.  I will also discuss and sent a referral to oncology so we can manage this together.  I was very candid with her and I am hesitant about her age and about doing a primary anastomosis given her frailty and her recent decline in overall health. Her Albumin is also low..  More than likely if she is a candidate for resection we will have to do a two-stage procedure with sigmoid resection and a diverting loop colostomy since he is at high risk to develop potential anastomotic leak and issues down the road. Will see her after oncology evaluation and appropriate workup was  performed. Currently she is not obstructed and no need for emergent surgical intervention  Please note that I spent 60 minutes in this encounter including personally reviewing imaging studies, coordinating her care, placing orders and performing appropriate documentation.    Caroleen Hamman, MD FACS General Surgeon 07/04/2022, 9:20 AM

## 2022-07-04 NOTE — Progress Notes (Unsigned)
Patient is referred here by Dr. Allen Norris for colonic mass.

## 2022-07-04 NOTE — Progress Notes (Unsigned)
Hematology/Oncology Consult Note [phone] [phone]     REFERRING PROVIDER: Lorna Few, DO   Patient Care Team: Lorna Few, DO as PCP - General (Family Medicine) Bary Castilla, Forest Gleason, MD (General Surgery) Clent Jacks, RN as Oncology Nurse Navigator  CHIEF COMPLAINTS/PURPOSE OF CONSULTATION:  Descending colon cancer  HISTORY OF PRESENTING ILLNESS:  Kristin Miller 86 y.o. female presents to establish care for descending colon cancer I have reviewed her chart and materials related to her cancer extensively and collaborated history with the patient. Summary of oncologic history is as follows: Oncology History  Cancer of descending colon ([place])  06/27/2022 Cancer Staging   Staging form: Colon and Rectum, AJCC 8th Edition - Clinical stage from 06/27/2022: Stage IIC (cT4b, cN0, cM0) - Signed by Earlie Server, MD on 07/05/2022 Stage prefix: Initial diagnosis Total positive nodes: 0   06/27/2022 Initial Diagnosis   Cancer of descending colon  -Patient was found to have iron deficiency anemia -06/27/2022 colonoscopy showed fungating partially obstructing large mass found in the descending colon.  The mass was circumferential, oozing was present. Biopsy showed invasive adenocarcinoma. -06/27/2022, EGD showed large hiatal hernia, esophagus plaque was found, suspicious for candidiasis.  Cells for cytology obtained.  Normal stomach and examined duodenum. Esophagus pression showed squamous epithelial cells admixed with mixed inflammatory cells and numerous fungal organisms, morphologically consistent with Candida species.  -Dr. Allen Norris has prescribed a course of Diflucan.     07/03/2022 Imaging   CT chest abdomen pelvis with contrast 1. Large irregular annular locally advanced colonic mass centered in the descending colon measuring up to 8.7 x 6.9 x 8.8 cm, with invasion of the pericolonic fat and direct tumor invasion of portions of the left iliopsoas  muscle at the level of the superior iliac crest and direct invasion of the left lateral flank muscle wall. Findings are compatible with primary colonic malignancy. No bowel obstruction. 2. No lymphadenopathy or other definite findings of metastatic disease. 3. Two scattered subcentimeter hypodense liver lesions, too small to accurately characterize, indeterminate. Indeterminate left adrenal 1.3 cm nodule. Further evaluation with MRI abdomen without and with IV contrast may be obtained at this time, as clinically warranted. 4. Two scattered tiny solid left pulmonary nodules, largest 0.3 cm, indeterminate. Recommend attention on follow-up chest CT in 3 months. 5. Large hiatal hernia. Mildly patulous lower thoracic esophagus with small amount of layering oral contrast, suggesting esophageal dysmotility and/or gastroesophageal reflux. 6. Chronic severe T12 vertebral compression fracture. 7. Aberrant nonaneurysmal right subclavian artery. 8.  Aortic Atherosclerosis (ICD10-I70.0).    Patient denies unintentional weight loss, blood in his stool, abdominal pain, nausea vomiting. She denies any constipation or difficulty passing bowel movements.  Her stool is dark, she attributes to taking oral iron supplementation.  Family history positive for brother with lung cancer.    + Fatigue and weakness.  Patient was accompanied by her husband to discuss management plan.  MEDICAL HISTORY:  Past Medical History:  Diagnosis Date   Allergy    Anemia    H/O AS A CHILD   Arthritis    Back pain    Breast cancer of upper-outer quadrant of right female breast ([place]) 06/12/2015   4 millimeter, T1a, N0; ER 90%, PR 0%, HER-2/neu not overexpressed.  Declined radiation therapy.   Collar bone fracture 05/28/2013   Fractured Collar bone on left   Dysrhythmia    h/o heart skipping beat years ago   Frequent headaches    h/o migraines  Hypertension    Osteopenia 04/2018   Bone density without significant interval change  since 2017.    SURGICAL HISTORY: Past Surgical History:  Procedure Laterality Date   ABDOMINAL HYSTERECTOMY  1970   Partial   BREAST BIOPSY Right 05/28/2015   INVASIVE MAMMARY CARCINOMA    BREAST BIOPSY Right 06/12/2015   Wide excision, sentinel lymph node biopsy.   BREAST LUMPECTOMY WITH AXILLARY LYMPH NODE DISSECTION Right 06/12/2015   4 mm, T1a,N0 withDCIS with clear margins. ER: 90%, PR 0%; Her 2 neu not overexpressed.    CATARACT EXTRACTION W/PHACO Left 02/03/2017   Procedure: CATARACT EXTRACTION PHACO AND INTRAOCULAR LENS PLACEMENT (IOC);  Surgeon: Birder Robson, MD;  Location: [facility];  Service: Ophthalmology;  Laterality: Left;  [place]  00:28 AP% 16.3 CDE 4.68 Fluid pack lot # [id number]   CATARACT EXTRACTION W/PHACO Right 03/01/2019   Procedure: CATARACT EXTRACTION PHACO AND INTRAOCULAR LENS PLACEMENT ([place])  RIGHT;  Surgeon: Birder Robson, MD;  Location: [place];  Service: Ophthalmology;  Laterality: Right;   COLONOSCOPY     COLONOSCOPY WITH PROPOFOL N/A 06/27/2022   Procedure: COLONOSCOPY WITH PROPOFOL;  Surgeon: [name], MD;  Location: [place];  Service: Endoscopy;  Laterality: N/A;   ESOPHAGOGASTRODUODENOSCOPY (EGD) WITH PROPOFOL N/A 06/27/2022   Procedure: ESOPHAGOGASTRODUODENOSCOPY (EGD) WITH PROPOFOL;  Surgeon: [name], MD;  Location: [place];  Service: Endoscopy;  Laterality: N/A;    SOCIAL HISTORY: Social History   Socioeconomic History   Marital status: Married    Spouse name: Not on file   Number of children: 3   Years of education: GED   Highest education level: 12th grade  Occupational History   Occupation: Retired  Tobacco Use   Smoking status: Never   Smokeless tobacco: Never   Tobacco comments:    smoking cessation materials not required  Vaping Use   Vaping Use: Never used  Substance and Sexual Activity   Alcohol use: No   Drug use: No   Sexual activity: Not Currently  Other Topics Concern   Not on  file  Social History Narrative   Not on file   Social Determinants of Health   Financial Resource Strain: Low Risk  (09/25/2021)   Overall Financial Resource Strain (CARDIA)    Difficulty of Paying Living Expenses: Not hard at all  Food Insecurity: No Food Insecurity (09/25/2021)   Hunger Vital Sign    Worried About Running Out of Food in the Last Year: Never true    Ran Out of Food in the Last Year: Never true  Transportation Needs: No Transportation Needs (09/25/2021)   PRAPARE - Hydrologist (Medical): No    Lack of Transportation (Non-Medical): No  Physical Activity: Inactive (09/25/2021)   Exercise Vital Sign    Days of Exercise per Week: 0 days    Minutes of Exercise per Session: 0 min  Stress: No Stress Concern Present (09/25/2021)   [place]    Feeling of Stress : Not at all  Social Connections: Moderately Integrated (09/25/2021)   Social Connection and Isolation Panel [NHANES]    Frequency of Communication with Friends and Family: More than three times a week    Frequency of Social Gatherings with Friends and Family: More than three times a week    Attends Religious Services: More than 4 times per year    Active Member of [employer] or Organizations: No    Attends Club or  Organization Meetings: Never    Marital Status: Married  Human resources officer Violence: Not At Risk (09/25/2021)   Humiliation, Afraid, Rape, and Kick questionnaire    Fear of Current or Ex-Partner: No    Emotionally Abused: No    Physically Abused: No    Sexually Abused: No    FAMILY HISTORY: Family History  Problem Relation Age of Onset   Mental illness Mother        Dementia   Stroke Father    Cancer Brother        Lung Cancer   Breast cancer Other     ALLERGIES:  is allergic to [place] [brimonidine tartrate-timolol], lisinopril, and other.  MEDICATIONS:  Current Outpatient Medications  Medication Sig Dispense  Refill   acetaminophen (TYLENOL) 500 MG tablet Take 500 mg by mouth every 6 (six) hours as needed for pain.     CLENPIQ 10-3.5-12 MG-GM -GM/175ML SOLN SMARTSIG:1 Kit(s) By Mouth Once     fluconazole (DIFLUCAN) 100 MG tablet Take 1 tablet (100 mg total) by mouth daily. 10 tablet 0   latanoprost (XALATAN) 0.005 % ophthalmic solution 1 drop at bedtime. Eye doctor     losartan-hydrochlorothiazide (HYZAAR) 100-12.5 MG tablet Take 1 tablet by mouth daily. 90 tablet 1   Calcium-Vitamin D (CALTRATE 600 PLUS-VIT D PO) Take 2 tablets by mouth daily. (Patient not taking: Reported on 06/30/2022)     ferrous sulfate 325 (65 FE) MG EC tablet Take 1 tablet by mouth daily with breakfast. (Patient not taking: Reported on 06/23/2022)     No current facility-administered medications for this visit.    Review of Systems  Constitutional:  Positive for fatigue. Negative for appetite change, chills and fever.  HENT:   Negative for hearing loss and voice change.   Eyes:  Negative for eye problems.  Respiratory:  Negative for chest tightness and cough.   Cardiovascular:  Negative for chest pain.  Gastrointestinal:  Negative for abdominal distention, abdominal pain and blood in stool.       Dark stool  Endocrine: Negative for hot flashes.  Genitourinary:  Negative for difficulty urinating and frequency.   Musculoskeletal:  Negative for arthralgias.  Skin:  Negative for itching and rash.  Neurological:  Negative for extremity weakness.  Hematological:  Negative for adenopathy.  Psychiatric/Behavioral:  Negative for confusion.      PHYSICAL EXAMINATION: ECOG PERFORMANCE STATUS: 1 - Symptomatic but completely ambulatory  Vitals:   07/04/22 1039  BP: (!) 150/79  Pulse: (!) 102  Temp: 99.7 F (37.6 C)  SpO2: 99%   Filed Weights   07/04/22 1039  Weight: 129 lb 1.6 oz (58.6 kg)    Physical Exam Constitutional:      General: She is not in acute distress.    Appearance: She is not diaphoretic.  HENT:      Head: Normocephalic and atraumatic.     Nose: Nose normal.     Mouth/Throat:     Pharynx: No oropharyngeal exudate.  Eyes:     General: No scleral icterus.    Pupils: Pupils are equal, round, and reactive to light.  Cardiovascular:     Rate and Rhythm: Normal rate and regular rhythm.     Heart sounds: No murmur heard. Pulmonary:     Effort: Pulmonary effort is normal. No respiratory distress.     Breath sounds: No rales.  Chest:     Chest wall: No tenderness.  Abdominal:     General: There is no distension.  Palpations: Abdomen is soft.     Tenderness: There is no abdominal tenderness.  Musculoskeletal:        General: Normal range of motion.     Cervical back: Normal range of motion and neck supple.  Skin:    General: Skin is warm and dry.     Findings: No erythema.  Neurological:     Mental Status: She is alert and oriented to person, place, and time.     Cranial Nerves: No cranial nerve deficit.     Motor: No abnormal muscle tone.     Coordination: Coordination normal.  Psychiatric:        Mood and Affect: Affect normal.     LABORATORY DATA:  I have reviewed the data as listed    Latest Ref Rng & Units 07/04/2022   11:31 AM 02/13/2022    2:44 PM 12/12/2021    1:48 PM  CBC  WBC 4.0 - 10.5 K/uL 15.7  13.7  12.8   Hemoglobin 12.0 - 15.0 g/dL 9.4  10.7  10.3   Hematocrit 36.0 - 46.0 % 31.5  33.4  33.5   Platelets 150 - 400 K/uL 562  485  509       Latest Ref Rng & Units 07/04/2022   11:31 AM 07/03/2022    3:41 PM 12/12/2021    1:48 PM  CMP  Glucose 70 - 99 mg/dL 125   106   BUN 8 - 23 mg/dL 10   14   Creatinine 0.44 - 1.00 mg/dL 0.72  0.60  0.74   Sodium 135 - 145 mmol/L 135   135   Potassium 3.5 - 5.1 mmol/L 3.5   3.7   Chloride 98 - 111 mmol/L 97   99   CO2 22 - 32 mmol/L 25   27   Calcium 8.9 - 10.3 mg/dL 8.8   8.9   Total Protein 6.5 - 8.1 g/dL 7.5   7.4   Total Bilirubin 0.3 - 1.2 mg/dL 0.4   0.1   Alkaline Phos 38 - 126 U/L 66   70   AST 15 -  41 U/L 17   19   ALT 0 - 44 U/L 9   17      RADIOGRAPHIC STUDIES: I have personally reviewed the radiological images as listed and agreed with the findings in the report. MM 3D SCREEN BREAST BILATERAL  Result Date: 07/04/2022 CLINICAL DATA:  Screening. EXAM: DIGITAL SCREENING BILATERAL MAMMOGRAM WITH TOMOSYNTHESIS AND CAD TECHNIQUE: Bilateral screening digital craniocaudal and mediolateral oblique mammograms were obtained. Bilateral screening digital breast tomosynthesis was performed. The images were evaluated with computer-aided detection. COMPARISON:  Previous exam(s). ACR Breast Density Category c: The breast tissue is heterogeneously dense, which may obscure small masses. FINDINGS: There are no findings suspicious for malignancy. IMPRESSION: No mammographic evidence of malignancy. A result letter of this screening mammogram will be mailed directly to the patient. RECOMMENDATION: Screening mammogram in one year. (Code:SM-B-01Y) BI-RADS CATEGORY  1: Negative. Electronically Signed   By: Beryle Flock M.D.   On: 07/04/2022 14:11   CT CHEST ABDOMEN PELVIS W CONTRAST  Result Date: 07/04/2022 CLINICAL DATA:  Recent diagnosis of colon cancer. Additional history of right breast cancer status post lumpectomy. Prior hysterectomy. Preoperative staging evaluation. * Tracking Code: BO * EXAM: CT CHEST, ABDOMEN, AND PELVIS WITH CONTRAST TECHNIQUE: Multidetector CT imaging of the chest, abdomen and pelvis was performed following the standard protocol during bolus administration of intravenous contrast. RADIATION DOSE REDUCTION: This  exam was performed according to the departmental dose-optimization program which includes automated exposure control, adjustment of the mA and/or kV according to patient size and/or use of iterative reconstruction technique. CONTRAST:  9m OMNIPAQUE IOHEXOL 300 MG/ML  SOLN COMPARISON:  None Available. FINDINGS: CT CHEST FINDINGS Cardiovascular: Normal heart size. No significant  pericardial effusion/thickening. Aberrant nonaneurysmal right subclavian artery arising from the distal aortic arch with retroesophageal course. Atherosclerotic nonaneurysmal thoracic aorta. Normal caliber pulmonary arteries. No central pulmonary emboli. Mediastinum/Nodes: No significant thyroid nodules. Mildly patulous lower thoracic esophagus with small amount of layering oral contrast. No pathologically enlarged axillary, mediastinal or hilar lymph nodes. Lungs/Pleura: No pneumothorax. No pleural effusion. No acute consolidative airspace disease or lung masses. 2 scattered tiny solid left pulmonary nodules, largest 0.3 cm in the posterior left upper lobe (series 3/image 36). Musculoskeletal: No aggressive appearing focal osseous lesions. Severe T12 vertebral compression fracture, chronic and unchanged from 05/26/2016 lateral chest radiograph. CT ABDOMEN PELVIS FINDINGS Hepatobiliary: Normal liver size. Two scattered subcentimeter hypodense liver lesions, largest 0.9 cm at the anterior right liver dome (series 2/image 38), too small to accurately characterize. Normal gallbladder with no radiopaque cholelithiasis. No biliary ductal dilatation. Pancreas: Normal, with no mass or duct dilation. Spleen: Normal size. No mass. Adrenals/Urinary Tract: No right adrenal nodules. Left adrenal 1.3 cm nodule with density 115 HU. No hydronephrosis. Small simple upper left renal cortical cysts, largest 1.3 cm, for which no follow-up imaging is recommended. Normal bladder. Stomach/Bowel: Large hiatal hernia. Stomach is nondistended and otherwise normal. Normal caliber small bowel with no small bowel wall thickening. Normal appendix. Oral contrast transits to the rectum. Large irregular annular colonic mass centered in the descending colon measuring up to 8.7 x 6.9 x 8.8 cm, with invasion of the pericolonic fat and direct tumor invasion of portions of the left iliopsoas muscle at the level of the superior iliac crest and direct  invasion of the left lateral flank muscle wall (series 2/image 84). Scattered mild sigmoid diverticulosis. Vascular/Lymphatic: Atherosclerotic nonaneurysmal abdominal aorta. Patent portal, splenic, hepatic and renal veins. No pathologically enlarged lymph nodes in the abdomen or pelvis. Reproductive: Status post hysterectomy, with no abnormal findings at the vaginal cuff. No adnexal mass. Other: No pneumoperitoneum, ascites or focal fluid collection. Musculoskeletal: No aggressive appearing focal osseous lesions. Moderate degenerative disc disease at L5-S1. IMPRESSION: 1. Large irregular annular locally advanced colonic mass centered in the descending colon measuring up to 8.7 x 6.9 x 8.8 cm, with invasion of the pericolonic fat and direct tumor invasion of portions of the left iliopsoas muscle at the level of the superior iliac crest and direct invasion of the left lateral flank muscle wall. Findings are compatible with primary colonic malignancy. No bowel obstruction. 2. No lymphadenopathy or other definite findings of metastatic disease. 3. Two scattered subcentimeter hypodense liver lesions, too small to accurately characterize, indeterminate. Indeterminate left adrenal 1.3 cm nodule. Further evaluation with MRI abdomen without and with IV contrast may be obtained at this time, as clinically warranted. 4. Two scattered tiny solid left pulmonary nodules, largest 0.3 cm, indeterminate. Recommend attention on follow-up chest CT in 3 months. 5. Large hiatal hernia. Mildly patulous lower thoracic esophagus with small amount of layering oral contrast, suggesting esophageal dysmotility and/or gastroesophageal reflux. 6. Chronic severe T12 vertebral compression fracture. 7. Aberrant nonaneurysmal right subclavian artery. 8.  Aortic Atherosclerosis (ICD10-I70.0). Electronically Signed   By: JIlona SorrelM.D.   On: 07/04/2022 08:43   DG BONE DENSITY (DXA)  Result Date: 07/03/2022  EXAM: DUAL X-RAY ABSORPTIOMETRY (DXA)  FOR BONE MINERAL DENSITY IMPRESSION: Your patient Kristin Miller completed a BMD test on 07/03/2022 using the [employer] (software version: 14.10) manufactured by [employer]. The following summarizes the results of our evaluation. Technologist::TNB PATIENT BIOGRAPHICAL: Name: Kristin Miller, Kristin Miller Patient ID: [id number] Birth Date: [birth year] Height: 60.0 in. Gender: Female Exam Date: 07/03/2022 Weight: 130.6 lbs. Indications: Advanced Age, Caucasian, Height Loss, High Risk Meds, History of Breast Cancer, History of Fracture (Adult), Hysterectomy, Low Calcium Intake, Osteopenia, Postmenopausal Fractures: Clavicle, Hand Left, Hand Right, Right shoulder Treatments: calcium w/ vit D, meloxicam DENSITOMETRY RESULTS: Site         Region     Measured Date Measured Age WHO Classification Young Adult T-score BMD         %Change vs. Previous Significant Change (*) DualFemur Neck Right 9/03/[age] 84.3 Osteopenia -2.2 0.735 g/cm2 1.2% - DualFemur Neck Right 05/15/2020 82.2 Osteopenia -2.2 0.726 g/cm2 -6.8% Yes DualFemur Neck Right 05/11/2018 80.2 Osteopenia -1.9 0.779 g/cm2 -1.1% - DualFemur Neck Right 11/14/2015 77.7 Osteopenia -1.8 0.788 g/cm2 - - DualFemur Total Mean 07/03/2022 84.3 Osteopenia -1.3 0.839 g/cm2 -3.0% Yes DualFemur Total Mean 05/15/2020 82.2 Osteopenia -1.1 0.865 g/cm2 -1.1% - DualFemur Total Mean 05/11/2018 80.2 Osteopenia -1.1 0.875 g/cm2 -1.6% - DualFemur Total Mean 11/14/2015 77.7 Normal -0.9 0.889 g/cm2 - - Left Forearm Radius 33% 07/03/2024 84.3 Osteoporosis -2.5 0.653 g/cm2 -7.1% Yes Left Forearm Radius 33% 05/11/2018 80.2 Osteopenia -2.0 0.703 g/cm2 -3.3% - Left Forearm Radius 33% 11/14/2015 77.7 Osteopenia -1.7 0.727 g/cm2 - - ASSESSMENT: The BMD measured at Forearm Radius 33% is 0.653 g/cm2 with a T-score of -2.5. This patient is considered osteoporotic according to [facility]) criteria. Compared with prior study, there has been no significant change in the  right hip. Compared with prior study, there has been significant decrease in the total mean. Compared with prior study, there has been significant decrease in the left forearm. Lumbar spine was not utilized due to advanced degenerative changes. Patient is not a candidate for FRAX due to diagnosis of osteoporosis. The scan quality is good. World Pharmacologist [facility]) criteria for post-menopausal, Caucasian Women: Normal:                   T-score at or above -1 SD Osteopenia/low bone mass: T-score between -1 and -2.5 SD Osteoporosis:             T-score at or below -2.5 SD RECOMMENDATIONS: 1. All patients should optimize calcium and vitamin D intake. 2. Consider FDA-approved medical therapies in postmenopausal women and men aged 19 years and older, based on the following: a. A hip or vertebral(clinical or morphometric) fracture b. T-score < -2.5 at the femoral neck or spine after appropriate evaluation to exclude secondary causes c. Low bone mass (T-score between -1.0 and -2.5 at the femoral neck or spine) and a 10-year probability of a hip fracture > 3% or a 10-year probability of a major osteoporosis-related fracture > 20% based on the [place]-adapted WHO algorithm 3. Clinician judgment and/or patient preferences may indicate treatment for people with 10-year fracture probabilities above or below these levels FOLLOW-UP: People with diagnosed cases of osteoporosis or at high risk for fracture should have regular bone mineral density tests. For patients eligible for Medicare, routine testing is allowed once every 2 years. The testing frequency can be increased to one year for patients who have rapidly progressing disease, those who are receiving or discontinuing medical  therapy to restore bone mass, or have additional risk factors. I have reviewed this report, and agree with the above findings. [facility] Electronically Signed   By: Zerita Boers M.D.   On: 07/03/2022 09:21    ASSESSMENT & PLAN:    Cancer Staging  Cancer of descending colon [facility]) Staging form: Colon and Rectum, AJCC 8th Edition - Clinical stage from 06/27/2022: Stage IIC (cT4b, cN0, cM0) - Signed by Earlie Server, MD on 07/05/2022   Cancer of descending colon [facility]) Image findings and pathology results were reviewed and discussed with patient.  Diagnosis of left side colon cancer was discussed.  She has at least locally advanced T4b colon cancer, no CT findings of lymph node involvement. Small lung nodules, too small to characterized.  Small liver lesions, and left adrenal nodules. Recommend MRI abdomen w wo contrast for further evaluation.  If no distant metastasis found on additional imaging. Recommend upfront surgical resection.  Adjuvant therapy depending on final pathology. She likely will need radiation and chemotherapy, although she may have difficulty tolerating aggressive treatment due to age and malnutrition. We will further discuss dose reduced chemotherapy in adjuvant setting in the future. Check cbc cmp CEA  Iron deficiency anemia due to chronic blood loss Check CBC iron tibc ferritin.  Today's lab results are consistent with IDA. I discussed  option of proceed with IV Venofer treatments. I discussed about the potential risks including but not limited to allergic reactions/infusion reactions including anaphylactic reactions, phlebitis, high blood pressure, wheezing, SOB, skin rash, weight gain, leg swelling, headache, nausea and fatigue, etc. Patient agrees with the plan Plan IV venofer x 4 to improve anemia level and optimize her condition pre-op   Malnutrition ([place]) Low albumin level  Recommend nutritionist evaluation  Goals of care, counseling/discussion Discussed with patient.    Orders Placed This Encounter  Procedures   MR LIVER W WO CONTRAST    Standing Status:   Future    Standing Expiration Date:   07/05/2023    Order Specific Question:   If indicated for the ordered procedure, I authorize the  administration of contrast media per Radiology protocol    Answer:   Yes    Order Specific Question:   What is the patient's sedation requirement?    Answer:   No Sedation    Order Specific Question:   Does the patient have a pacemaker or implanted devices?    Answer:   No    Order Specific Question:   Preferred imaging location?    Answer:   [facility] (table limit - 550lbs)   Ferritin    Standing Status:   Future    Number of Occurrences:   1    Standing Expiration Date:   01/03/2023   Iron and TIBC    Standing Status:   Future    Number of Occurrences:   1    Standing Expiration Date:   07/05/2023   Comprehensive metabolic panel    Standing Status:   Future    Number of Occurrences:   1    Standing Expiration Date:   07/05/2023   CBC with Differential/Platelet    Standing Status:   Future    Number of Occurrences:   1    Standing Expiration Date:   07/05/2023   CEA    Standing Status:   Future    Number of Occurrences:   1    Standing Expiration Date:   07/04/2023   Retic Panel  Standing Status:   Future    Number of Occurrences:   1    Standing Expiration Date:   07/05/2023      All questions were answered. The patient knows to call the clinic with any problems, questions or concerns. No barriers to learning was detected.  Earlie Server, MD 07/04/2022

## 2022-07-04 NOTE — Progress Notes (Signed)
Met with Mrs. Vogelgesang and her spouse. Introduced Therapist, nutritional and provided my contact information for future needs. MRI abdomen has been scheduled. Escorted to the lab. She will be contacted by a team member regarding need for iron once results have been viewed by Dr. Tasia Catchings.

## 2022-07-05 ENCOUNTER — Encounter: Payer: Self-pay | Admitting: Oncology

## 2022-07-05 DIAGNOSIS — E46 Unspecified protein-calorie malnutrition: Secondary | ICD-10-CM | POA: Insufficient documentation

## 2022-07-05 DIAGNOSIS — Z7189 Other specified counseling: Secondary | ICD-10-CM | POA: Insufficient documentation

## 2022-07-05 LAB — CEA: CEA: 9.3 ng/mL — ABNORMAL HIGH (ref 0.0–4.7)

## 2022-07-05 NOTE — Assessment & Plan Note (Addendum)
Low albumin level  Recommend nutritionist evaluation

## 2022-07-05 NOTE — Assessment & Plan Note (Addendum)
Check CBC iron tibc ferritin.  Today's lab results are consistent with IDA. I discussed  option of proceed with IV Venofer treatments. I discussed about the potential risks including but not limited to allergic reactions/infusion reactions including anaphylactic reactions, phlebitis, high blood pressure, wheezing, SOB, skin rash, weight gain, leg swelling, headache, nausea and fatigue, etc. Patient agrees with the plan Plan IV venofer x 4 to improve anemia level and optimize her condition pre-op

## 2022-07-05 NOTE — Assessment & Plan Note (Addendum)
Image findings and pathology results were reviewed and discussed with patient.  Diagnosis of left side colon cancer was discussed.  She has at least locally advanced T4b colon cancer, no CT findings of lymph node involvement. Small lung nodules, too small to characterized.  Small liver lesions, and left adrenal nodules. Recommend MRI abdomen w wo contrast for further evaluation.  If no distant metastasis found on additional imaging. Recommend upfront surgical resection.  Adjuvant therapy depending on final pathology. She likely will need radiation and chemotherapy, although she may have difficulty tolerating aggressive treatment due to age and malnutrition. We will further discuss dose reduced chemotherapy in adjuvant setting in the future. Check cbc cmp CEA

## 2022-07-05 NOTE — Assessment & Plan Note (Signed)
Discussed with patient

## 2022-07-07 ENCOUNTER — Telehealth: Payer: Self-pay

## 2022-07-07 DIAGNOSIS — E279 Disorder of adrenal gland, unspecified: Secondary | ICD-10-CM

## 2022-07-07 DIAGNOSIS — K769 Liver disease, unspecified: Secondary | ICD-10-CM

## 2022-07-07 NOTE — Telephone Encounter (Signed)
-----   Message from Earlie Server, MD sent at 07/05/2022  1:38 PM EST ----- Please arrange patient to get IV venofer twice per week x 4 doses, 3 days apart.  Also she has liver lesions and adrenal lesion. Please change the MRI liver to regular MRI abdomen w wo contrast.  Please also refer her to nutritionist.  Thanks.

## 2022-07-07 NOTE — Telephone Encounter (Signed)
New order entered for MRI abdomen. Pt scheduled for MRI Liver on Thurs. Please check if it can be switched to MRI abdomen.   Please schedule and inform pt of appts:   -Switch MRI liver to MRI abdomen -IV venofer twice a week for 2 weeks (total 4 doses)- 3 days apart -See dietitian

## 2022-07-08 ENCOUNTER — Encounter: Payer: Self-pay | Admitting: Oncology

## 2022-07-09 ENCOUNTER — Other Ambulatory Visit: Payer: Self-pay

## 2022-07-09 ENCOUNTER — Ambulatory Visit: Payer: Medicare HMO | Admitting: Surgery

## 2022-07-09 ENCOUNTER — Encounter: Payer: Self-pay | Admitting: Surgery

## 2022-07-09 ENCOUNTER — Telehealth: Payer: Self-pay | Admitting: Surgery

## 2022-07-09 VITALS — BP 142/76 | HR 111 | Temp 98.3°F | Ht 60.0 in | Wt 125.0 lb

## 2022-07-09 DIAGNOSIS — C189 Malignant neoplasm of colon, unspecified: Secondary | ICD-10-CM | POA: Diagnosis not present

## 2022-07-09 MED ORDER — NEOMYCIN SULFATE 500 MG PO TABS: ORAL_TABLET | ORAL | 0 refills | Status: DC

## 2022-07-09 MED ORDER — METRONIDAZOLE 500 MG PO TABS: ORAL_TABLET | ORAL | 0 refills | Status: DC

## 2022-07-09 MED ORDER — BISACODYL 5 MG PO TBEC: 5.0000 mg | DELAYED_RELEASE_TABLET | Freq: Once | ORAL | 0 refills | Status: AC

## 2022-07-09 MED ORDER — POLYETHYLENE GLYCOL 3350 17 GM/SCOOP PO POWD: 1.0000 | Freq: Once | ORAL | 0 refills | Status: AC

## 2022-07-09 NOTE — H&P (View-Only) (Signed)
Outpatient Surgical Follow Up  07/09/2022  Kristin Miller is an 86 y.o. female.   Chief Complaint  Patient presents with   Follow-up    Update H&P    HPI: Kristin Miller is a 86 y.o. female seen in consultation at the request of Dr.Wohl.  She underwent a recent colonoscopy for anemia and weakness and there was evidence of a descending large colon mass near obstruction.  Please note that I personally reviewed the images.  Also the pathology shows evidence of adenocarcinoma.  She denies any melena or hematochezia.  He endorses some weakness as well as some loose bowel movements. She completed CT scan I have personally reviewed and showed the images to the patient. She also states that over the last few months her health has declined. CBC shows anemia with a hemoglobin of 9.4  CMP shows hypovolemia albumin of 2.8 of 0.74 She comes accompanied by her husband SHe has lost about 15 pounds for the last 6 months or so. She does have a history of prior hysterectomy and breast cancer as well.  She Did undergo a right lumpectomy for cancer. He also had an EGD showing a large hiatal hernia.  Past Medical History:  Diagnosis Date   Allergy    Anemia    H/O AS A CHILD   Arthritis    Back pain    Breast cancer of upper-outer quadrant of right female breast (HCC) 06/12/2015   4 millimeter, T1a, N0; ER 90%, PR 0%, HER-2/neu not overexpressed.  Declined radiation therapy.   Collar bone fracture 05/28/2013   Fractured Collar bone on left   Dysrhythmia    h/o heart skipping beat years ago   Frequent headaches    h/o migraines   Hypertension    Osteopenia 04/2018   Bone density without significant interval change since 2017.    Past Surgical History:  Procedure Laterality Date   ABDOMINAL HYSTERECTOMY  1970   Partial   BREAST BIOPSY Right 05/28/2015   INVASIVE MAMMARY CARCINOMA    BREAST BIOPSY Right 06/12/2015   Wide excision, sentinel lymph node biopsy.   BREAST LUMPECTOMY WITH  AXILLARY LYMPH NODE DISSECTION Right 06/12/2015   4 mm, T1a,N0 withDCIS with clear margins. ER: 90%, PR 0%; Her 2 neu not overexpressed.    CATARACT EXTRACTION W/PHACO Left 02/03/2017   Procedure: CATARACT EXTRACTION PHACO AND INTRAOCULAR LENS PLACEMENT (IOC);  Surgeon: Porfilio, William, MD;  Location: [facility];  Service: Ophthalmology;  Laterality: Left;  US  00:28 AP% 16.3 CDE 4.68 Fluid pack lot # [id number]   CATARACT EXTRACTION W/PHACO Right 03/01/2019   Procedure: CATARACT EXTRACTION PHACO AND INTRAOCULAR LENS PLACEMENT (IOC)  RIGHT;  Surgeon: Porfilio, William, MD;  Location: [facility];  Service: Ophthalmology;  Laterality: Right;   COLONOSCOPY     COLONOSCOPY WITH PROPOFOL N/A 06/27/2022   Procedure: COLONOSCOPY WITH PROPOFOL;  Surgeon: Wohl, Darren, MD;  Location: [facility];  Service: Endoscopy;  Laterality: N/A;   ESOPHAGOGASTRODUODENOSCOPY (EGD) WITH PROPOFOL N/A 06/27/2022   Procedure: ESOPHAGOGASTRODUODENOSCOPY (EGD) WITH PROPOFOL;  Surgeon: Wohl, Darren, MD;  Location: [facility];  Service: Endoscopy;  Laterality: N/A;    Family History  Problem Relation Age of Onset   Mental illness Mother        Dementia   Stroke Father    Cancer Brother        Lung Cancer   Breast cancer Other     Social History:  reports that she has never smoked.   She has never used smokeless tobacco. She reports that she does not drink alcohol and does not use drugs.  Allergies:  Allergies  Allergen Reactions   Combigan [Brimonidine Tartrate-Timolol]     Redness, burning, irritation    Lisinopril Cough   Other Rash    SUTURES-(PARTIAL HYSTERECTOMY)    Medications reviewed.    ROS Full ROS performed and is otherwise negative other than what is stated in HPI   BP (!) 142/76   Pulse (!) 111   Temp 98.3 F (36.8 C) (Oral)   Ht 5' (1.524 m)   Wt 125 lb (56.7 kg)   SpO2 98%   BMI 24.41 kg/m   Physical Exam CONSTITUTIONAL: She is  frail. EYES: Pupils are  equal, round, , Sclera are non-icteric. EARS, NOSE, MOUTH AND THROAT: The oropharynx is clear. The oral mucosa is pink and moist. Hearing is intact to voice. LYMPH NODES:  Lymph nodes in the neck are normal. RESPIRATORY:  Lungs are clear. There is normal respiratory effort, with equal breath sounds bilaterally, and without pathologic use of accessory muscles. CARDIOVASCULAR: Heart is regular without murmurs, gallops, or rubs. GI: The abdomen is soft, nontender, and nondistended. Today I can palpate some hard area next to the left iliac crest and anterior and lateral abd wall musculature likely from direct muscle involvement GU: Rectal deferred.   MUSCULOSKELETAL: Normal muscle strength and tone. No cyanosis or edema.   SKIN: Turgor is good and there are no pathologic skin lesions or ulcers. NEUROLOGIC: Motor and sensation is grossly normal. Cranial nerves are grossly intact. PSYCH:  Oriented to person, place and time. Affect is normal.   Assessment/Plan: 86-year-old female with recently diagnosed descending colon cancer that is T 4 .  Discussed with the patient in detail about my thought process.   She did have a CT scan showing a large descending mass in the colon with involvement of the left flank musculature including the left iliopsoas muscle.  I was very candid with her and I am hesitant about her age and about doing a primary anastomosis given her frailty and her recent decline in overall health. Her Albumin is also low..  We will attempt R0 resection we will have to do a two-stage procedure with colon resection and a diverting loop ileostomy vs end colostomy since he is at high risk to develop potential anastomotic leak and issues down the road. Also understand that there may be some positive margins and tumor left behind requiring additional chemoradiation therapy. We wil type and cross as well Discussed with the patient and the family in detail.  Risks, benefits and possible complications  including but not limited to: Bleeding, infection, reintervention's, anastomotic leak, need for transfusion and even death. They understand and are fully aware of the situation and the potential limitations. Currently she is not obstructed and no need for emergent surgical intervention   Please note that I spent 40 minutes in this encounter including personally reviewing imaging studies, coordinating her care, placing orders and performing appropriate documentation.    Khamron Gellert, MD FACS General Surgeon 

## 2022-07-09 NOTE — Patient Instructions (Addendum)
Please pick up your medication at the pharmacy.  Please follow your bowel prep instructions. If you have any questions or concerns.

## 2022-07-09 NOTE — Progress Notes (Signed)
Outpatient Surgical Follow Up  07/09/2022  [name] is an 86 y.o. female.   Chief Complaint  Patient presents with   Follow-up    Update H&P    HPI: Kristin Miller is a 86 y.o. female seen in consultation at the request of Dr.Wohl.  She underwent a recent colonoscopy for anemia and weakness and there was evidence of a descending large colon mass near obstruction.  Please note that I personally reviewed the images.  Also the pathology shows evidence of adenocarcinoma.  She denies any melena or hematochezia.  He endorses some weakness as well as some loose bowel movements. She completed CT scan I have personally reviewed and showed the images to the patient. She also states that over the last few months her health has declined. CBC shows anemia with a hemoglobin of 9.4  CMP shows hypovolemia albumin of 2.8 of 0.74 She comes accompanied by her husband SHe has lost about 15 pounds for the last 6 months or so. She does have a history of prior hysterectomy and breast cancer as well.  She Did undergo a right lumpectomy for cancer. He also had an EGD showing a large hiatal hernia.  Past Medical History:  Diagnosis Date   Allergy    Anemia    H/O AS A CHILD   Arthritis    Back pain    Breast cancer of upper-outer quadrant of right female breast ([place]) 06/12/2015   4 millimeter, T1a, N0; ER 90%, PR 0%, HER-2/neu not overexpressed.  Declined radiation therapy.   Collar bone fracture 05/28/2013   Fractured Collar bone on left   Dysrhythmia    h/o heart skipping beat years ago   Frequent headaches    h/o migraines   Hypertension    Osteopenia 04/2018   Bone density without significant interval change since 2017.    Past Surgical History:  Procedure Laterality Date   ABDOMINAL HYSTERECTOMY  1970   Partial   BREAST BIOPSY Right 05/28/2015   INVASIVE MAMMARY CARCINOMA    BREAST BIOPSY Right 06/12/2015   Wide excision, sentinel lymph node biopsy.   BREAST LUMPECTOMY WITH  AXILLARY LYMPH NODE DISSECTION Right 06/12/2015   4 mm, T1a,N0 withDCIS with clear margins. ER: 90%, PR 0%; Her 2 neu not overexpressed.    CATARACT EXTRACTION W/PHACO Left 02/03/2017   Procedure: CATARACT EXTRACTION PHACO AND INTRAOCULAR LENS PLACEMENT (IOC);  Surgeon: Birder Robson, MD;  Location: [facility];  Service: Ophthalmology;  Laterality: Left;  [place]  00:28 AP% 16.3 CDE 4.68 Fluid pack lot # [id number]   CATARACT EXTRACTION W/PHACO Right 03/01/2019   Procedure: CATARACT EXTRACTION PHACO AND INTRAOCULAR LENS PLACEMENT ([place])  RIGHT;  Surgeon: Birder Robson, MD;  Location: [place] [facility];  Service: Ophthalmology;  Laterality: Right;   COLONOSCOPY     COLONOSCOPY WITH PROPOFOL N/A 06/27/2022   Procedure: COLONOSCOPY WITH PROPOFOL;  Surgeon: [name], MD;  Location: [place];  Service: Endoscopy;  Laterality: N/A;   ESOPHAGOGASTRODUODENOSCOPY (EGD) WITH PROPOFOL N/A 06/27/2022   Procedure: ESOPHAGOGASTRODUODENOSCOPY (EGD) WITH PROPOFOL;  Surgeon: [name], MD;  Location: [place];  Service: Endoscopy;  Laterality: N/A;    Family History  Problem Relation Age of Onset   Mental illness Mother        Dementia   Stroke Father    Cancer Brother        Lung Cancer   Breast cancer Other     Social History:  reports that she has never smoked.  She has never used smokeless tobacco. She reports that she does not drink alcohol and does not use drugs.  Allergies:  Allergies  Allergen Reactions   Combigan [Brimonidine Tartrate-Timolol]     Redness, burning, irritation    Lisinopril Cough   Other Rash    SUTURES-(PARTIAL HYSTERECTOMY)    Medications reviewed.    ROS Full ROS performed and is otherwise negative other than what is stated in HPI   BP (!) 142/76   Pulse (!) 111   Temp 98.3 F (36.8 C) (Oral)   Ht 5' (1.524 m)   Wt 125 lb (56.7 kg)   SpO2 98%   BMI 24.41 kg/m   Physical Exam CONSTITUTIONAL: She is  frail. EYES: Pupils are  equal, round, , Sclera are non-icteric. EARS, NOSE, MOUTH AND THROAT: The oropharynx is clear. The oral mucosa is pink and moist. Hearing is intact to voice. LYMPH NODES:  Lymph nodes in the neck are normal. RESPIRATORY:  Lungs are clear. There is normal respiratory effort, with equal breath sounds bilaterally, and without pathologic use of accessory muscles. CARDIOVASCULAR: Heart is regular without murmurs, gallops, or rubs. GI: The abdomen is soft, nontender, and nondistended. Today I can palpate some hard area next to the left iliac crest and anterior and lateral abd wall musculature likely from direct muscle involvement GU: Rectal deferred.   MUSCULOSKELETAL: Normal muscle strength and tone. No cyanosis or edema.   SKIN: Turgor is good and there are no pathologic skin lesions or ulcers. NEUROLOGIC: Motor and sensation is grossly normal. Cranial nerves are grossly intact. PSYCH:  Oriented to person, place and time. Affect is normal.   Assessment/Plan: 86 year old female with recently diagnosed descending colon cancer that is T 4 .  Discussed with the patient in detail about my thought process.   She did have a CT scan showing a large descending mass in the colon with involvement of the left flank musculature including the left iliopsoas muscle.  I was very candid with her and I am hesitant about her age and about doing a primary anastomosis given her frailty and her recent decline in overall health. Her Albumin is also low..  We will attempt R0 resection we will have to do a two-stage procedure with colon resection and a diverting loop ileostomy vs end colostomy since he is at high risk to develop potential anastomotic leak and issues down the road. Also understand that there may be some positive margins and tumor left behind requiring additional chemoradiation therapy. We wil type and cross as well Discussed with the patient and the family in detail.  Risks, benefits and possible complications  including but not limited to: Bleeding, infection, reintervention's, anastomotic leak, need for transfusion and even death. They understand and are fully aware of the situation and the potential limitations. Currently she is not obstructed and no need for emergent surgical intervention   Please note that I spent 40 minutes in this encounter including personally reviewing imaging studies, coordinating her care, placing orders and performing appropriate documentation.    Caroleen Hamman, MD [facility] General Surgeon

## 2022-07-09 NOTE — Telephone Encounter (Signed)
Patient has been advised of Pre-Admission date/time, and Surgery date at [facility] while in office for follow up today.    Surgery Date: 07/29/22 Preadmission Testing Date: 07/22/22 (phone 1p-5p)  Patient has been made aware to call [phone], between 1-3:00pm the day before surgery, to find out what time to arrive for surgery.

## 2022-07-10 ENCOUNTER — Other Ambulatory Visit: Payer: Self-pay

## 2022-07-10 ENCOUNTER — Ambulatory Visit
Admission: RE | Admit: 2022-07-10 | Discharge: 2022-07-10 | Disposition: A | Discharge status: Home / Self Care | Payer: Medicare HMO | Source: Ambulatory Visit | Attending: Oncology | Admitting: Oncology

## 2022-07-10 DIAGNOSIS — E279 Disorder of adrenal gland, unspecified: Secondary | ICD-10-CM | POA: Insufficient documentation

## 2022-07-10 DIAGNOSIS — K769 Liver disease, unspecified: Secondary | ICD-10-CM | POA: Insufficient documentation

## 2022-07-10 MED ORDER — POLYETHYLENE GLYCOL 3350 17 GM/SCOOP PO POWD: ORAL | 0 refills | Status: DC

## 2022-07-10 MED ORDER — GADOBUTROL 1 MMOL/ML IV SOLN
6.0000 mL | Freq: Once | INTRAVENOUS | Status: AC | PRN
  Administered 2022-07-10: 6 mL via INTRAVENOUS

## 2022-07-18 ENCOUNTER — Telehealth: Payer: Self-pay

## 2022-07-18 NOTE — Telephone Encounter (Signed)
Received call from Kristin Miller regarding upcoming appointments. She is scheduled for surgery 07/29/2022. She is also scheduled for an iron infusion 07/28/2022 late in the day. She will have started her colon prep for surgery and is asking if the iron can be given another day. She will also need a follow up with Dr. Tasia Catchings two weeks after surgery to discuss pathology findings.

## 2022-07-19 ENCOUNTER — Ambulatory Visit
Admission: EM | Admit: 2022-07-19 | Discharge: 2022-07-19 | Disposition: A | Discharge status: Home / Self Care | Payer: Medicare HMO | Attending: Family Medicine | Admitting: Family Medicine

## 2022-07-19 ENCOUNTER — Encounter: Payer: Self-pay | Admitting: Emergency Medicine

## 2022-07-19 DIAGNOSIS — U071 COVID-19: Secondary | ICD-10-CM | POA: Diagnosis present

## 2022-07-19 LAB — SARS CORONAVIRUS 2 BY RT PCR: SARS Coronavirus 2 by RT PCR: POSITIVE — AB

## 2022-07-19 MED ORDER — PROMETHAZINE-DM 6.25-15 MG/5ML PO SYRP: 2.5000 mL | ORAL_SOLUTION | Freq: Four times a day (QID) | ORAL | 0 refills | Status: DC | PRN

## 2022-07-19 NOTE — ED Triage Notes (Signed)
Patient states that she has had cough and congestion that started 3 days ago.  Patient states that her husband had COVID.  Patient states that her covid home test was positive yesterday.  Patient denies fevers.

## 2022-07-19 NOTE — ED Provider Notes (Incomplete)
[facility]    CSN: [id number] Arrival date & time: 07/19/22  0917      History   Chief Complaint Chief Complaint  Patient presents with   Covid Positive   Cough    HPI Kristin Miller is a 86 y.o. female.   HPI   Kristin Miller presents for ***.   Fever : no  Chills: no Sore throat: no   Cough: no Sputum: no Nasal congestion : no  Rhinorrhea: no Myalgias: no Appetite: normal  Hydration: normal  Abdominal pain: no Nausea: no Vomiting: no Diarrhea: No Rash: No Sleep disturbance: no Headache: no      Past Medical History:  Diagnosis Date   Allergy    Anemia    H/O AS A CHILD   Arthritis    Back pain    Breast cancer of upper-outer quadrant of right female breast ([place]) 06/12/2015   4 millimeter, T1a, N0; ER 90%, PR 0%, HER-2/neu not overexpressed.  Declined radiation therapy.   Collar bone fracture 05/28/2013   Fractured Collar bone on left   Dysrhythmia    h/o heart skipping beat years ago   Frequent headaches    h/o migraines   Hypertension    Osteopenia 04/2018   Bone density without significant interval change since 2017.    Patient Active Problem List   Diagnosis Date Noted   Malnutrition ([place]) 07/05/2022   Goals of care, counseling/discussion 07/05/2022   Cancer of descending colon ([place]) 07/04/2022   Iron deficiency anemia due to chronic blood loss 06/27/2022   Intestines neoplasm 06/27/2022   Primary open angle glaucoma (POAG) of both eyes, moderate stage 05/14/2022   Contusion of thigh 08/02/2018   BP (high blood pressure) 11/15/2015   OP (osteoporosis) 11/15/2015   Primary cancer of lower-outer quadrant of right breast ([place]) 06/20/2015   Low back pain 08/02/2014   HTN (hypertension) 08/02/2014   Basal cell carcinoma in situ of skin of left shoulder 07/31/2014   Microcalcifications of the breast 11/28/2013   Abnormal finding on mammography 10/03/2013   Skin cancer 10/03/2013   Mammographic microcalcification 05/04/2013    Chronic bilateral low back pain without sciatica 03/29/2012    Past Surgical History:  Procedure Laterality Date   ABDOMINAL HYSTERECTOMY  1970   Partial   BREAST BIOPSY Right 05/28/2015   INVASIVE MAMMARY CARCINOMA    BREAST BIOPSY Right 06/12/2015   Wide excision, sentinel lymph node biopsy.   BREAST LUMPECTOMY WITH AXILLARY LYMPH NODE DISSECTION Right 06/12/2015   4 mm, T1a,N0 withDCIS with clear margins. ER: 90%, PR 0%; Her 2 neu not overexpressed.    CATARACT EXTRACTION W/PHACO Left 02/03/2017   Procedure: CATARACT EXTRACTION PHACO AND INTRAOCULAR LENS PLACEMENT (IOC);  Surgeon: Birder Robson, MD;  Location: [facility];  Service: Ophthalmology;  Laterality: Left;  [place]  00:28 AP% 16.3 CDE 4.68 Fluid pack lot # [id number]   CATARACT EXTRACTION W/PHACO Right 03/01/2019   Procedure: CATARACT EXTRACTION PHACO AND INTRAOCULAR LENS PLACEMENT ([place])  RIGHT;  Surgeon: Birder Robson, MD;  Location: [facility];  Service: Ophthalmology;  Laterality: Right;   COLONOSCOPY     COLONOSCOPY WITH PROPOFOL N/A 06/27/2022   Procedure: COLONOSCOPY WITH PROPOFOL;  Surgeon: [name], MD;  Location: [facility];  Service: Endoscopy;  Laterality: N/A;   ESOPHAGOGASTRODUODENOSCOPY (EGD) WITH PROPOFOL N/A 06/27/2022   Procedure: ESOPHAGOGASTRODUODENOSCOPY (EGD) WITH PROPOFOL;  Surgeon: [name], MD;  Location: [facility];  Service: Endoscopy;  Laterality: N/A;    OB History  Gravida  3   Para  3   Term      Preterm      AB      Living  3      SAB      IAB      Ectopic      Multiple      Live Births           Obstetric Comments  1st Menstrual Cycle: 14 1st Pregnancy: 20          Home Medications    Prior to Admission medications   Medication Sig Start Date End Date Taking? Authorizing Provider  acetaminophen (TYLENOL) 500 MG tablet Take 500 mg by mouth every 6 (six) hours as needed for pain.    [provider]  CLENPIQ 10-3.5-12  MG-GM -GM/175ML SOLN SMARTSIG:1 Kit(s) By Mouth Once 06/23/22   [provider]  ferrous sulfate 325 (65 FE) MG EC tablet Take 1 tablet by mouth daily with breakfast. 05/16/22 05/16/23  [provider]  latanoprost (XALATAN) 0.005 % ophthalmic solution 1 drop at bedtime. Eye doctor 12/18/20   [provider]  losartan-hydrochlorothiazide (HYZAAR) 100-12.5 MG tablet Take 1 tablet by mouth daily. 01/30/22   Juline Patch, MD  metroNIDAZOLE (FLAGYL) 500 MG tablet Take 2 tablets at 8 AM, take 2 tablets at 2 PM, and take 2 tablets at 8 PM the day prior to your surgery. 07/09/22   Pabon, Marjory Lies, MD  neomycin (MYCIFRADIN) 500 MG tablet Take 2 tablet at 8am, take 2 tablets at 2pm, and take 2 tablets at 8pm the day prior to your surgery 07/09/22   Pabon, [place] F, MD  polyethylene glycol powder (MIRALAX) 17 GM/SCOOP powder Mix full container in 64 ounces of Gatorade or other clear liquid for your bowel prep. NO Red Liquids 07/10/22   Pabon, Marjory Lies, MD    Family History Family History  Problem Relation Age of Onset   Mental illness Mother        Dementia   Stroke Father    Cancer Brother        Lung Cancer   Breast cancer Other     Social History Social History   Tobacco Use   Smoking status: Never   Smokeless tobacco: Never   Tobacco comments:    smoking cessation materials not required  Vaping Use   Vaping Use: Never used  Substance Use Topics   Alcohol use: No   Drug use: No     Allergies   Combigan [brimonidine tartrate-timolol], Lisinopril, and Other   Review of Systems Review of Systems: negative unless otherwise stated in HPI.      Physical Exam Triage Vital Signs ED Triage Vitals  Enc Vitals Group     BP 07/19/22 0953 127/78     Pulse Rate 07/19/22 0953 (!) 107     Resp 07/19/22 0953 14     Temp 07/19/22 0953 99.7 F (37.6 C)     Temp Source 07/19/22 0953 Oral     SpO2 07/19/22 0953 97 %     Weight 07/19/22 0951 125 lb (56.7 kg)      Height 07/19/22 0951 5' (1.524 m)     Head Circumference --      Peak Flow --      Pain Score 07/19/22 0951 0     Pain Loc --      Pain Edu? --      Excl. in [place]? --  No data found.  Updated Vital Signs BP 127/78 (BP Location: Right Arm)   Pulse (!) 107   Temp 99.7 F (37.6 C) (Oral)   Resp 14   Ht 5' (1.524 m)   Wt 56.7 kg   SpO2 97%   BMI 24.41 kg/m   Visual Acuity Right Eye Distance:   Left Eye Distance:   Bilateral Distance:    Right Eye Near:   Left Eye Near:    Bilateral Near:     Physical Exam GEN:     alert, non-toxic appearing female in no distress ***   HENT:  mucus membranes moist, oropharyngeal ***without lesions or ***exudate, no*** tonsillar hypertrophy, *** mild oropharyngeal erythema , *** moderate erythematous edematous turbinates, ***clear nasal discharge, ***bilateral TM normal EYES:   pupils equal and reactive, ***no scleral injection or discharge NECK:  normal ROM, no ***lymphadenopathy, ***no meningismus   RESP:  no increased work of breathing, ***clear to auscultation bilaterally CVS:   regular rate ***and rhythm Skin:   warm and dry, no rash on visible skin***    UC Treatments / Results  Labs (all labs ordered are listed, but only abnormal results are displayed) Labs Reviewed  SARS CORONAVIRUS 2 BY RT PCR    EKG   Radiology No results found.  Procedures Procedures (including critical care time)  Medications Ordered in UC Medications - No data to display  Initial Impression / Assessment and Plan / UC Course  I have reviewed the triage vital signs and the nursing notes.  Pertinent labs & imaging results that were available during my care of the patient were reviewed by me and considered in my medical decision making (see chart for details).       Pt is a 86 y.o. female who presents for *** days of respiratory symptoms. [place] is ***afebrile here without recent antipyretics. Satting well on room air. Overall pt is  ***non-toxic appearing, well hydrated, without respiratory distress. Pulmonary exam ***is unremarkable.  COVID and influenza testing obtained ***and was negative. ***Pt to quarantine until COVID test results or longer if positive.  I will call patient with test results, if positive. History consistent with ***viral respiratory illness. Discussed symptomatic treatment.  Explained lack of efficacy of antibiotics in viral disease.  Typical duration of symptoms discussed.   Return and ED precautions given and voiced understanding. Discussed MDM, treatment plan and plan for follow-up with patient/guardian*** who agrees with plan.     Final Clinical Impressions(s) / UC Diagnoses   Final diagnoses:  None   Discharge Instructions   None    ED Prescriptions   None    PDMP not reviewed this encounter.

## 2022-07-19 NOTE — Discharge Instructions (Addendum)
Your test for COVID-19 was positive, meaning that you were infected with the novel coronavirus and could give the germ to others.  Please continue isolation at home for at least 5 days since the start of your symptoms. Once you complete your 5 day quarantine, you may return to normal activities as long as you've not had a fever for over 24 hours(without taking fever reducing medicine) and your symptoms are improving. Be sure to wear a mask until Day 11.   If your were prescribed medication. Stop by the pharmacy to pick it up.   Please continue good preventive care measures, including:  frequent hand-washing, avoid touching your face, cover coughs/sneezes, stay out of crowds and keep a 6 foot distance from others.  Go to the nearest hospital emergency room if fever/cough/breathlessness are severe or illness seems like a threat to life.  You can take Tylenol and/or Ibuprofen as needed for fever reduction and pain relief.    For cough: honey 1/2 to 1 teaspoon (you can dilute the honey in water or another fluid).  You can also use guaifenesin and dextromethorphan for cough. You can use a humidifier for chest congestion and cough.  If you don't have a humidifier, you can sit in the bathroom with the hot shower running.      For sore throat: try warm salt water gargles, Mucinex sore throat cough drops or cepacol lozenges, throat spray, warm tea or water with lemon/honey, popsicles or ice, or OTC cold relief medicine for throat discomfort. You can also purchase chloraseptic spray at the pharmacy or dollar store.   For congestion: take a daily anti-histamine like Zyrtec, Claritin, and a oral decongestant, such as pseudoephedrine.  You can also use Flonase 1-2 sprays in each nostril daily. Afrin is also a good option, if you do not have high blood pressure.    It is important to stay hydrated: drink plenty of fluids (water, gatorade/powerade/pedialyte, juices, or teas) to keep your throat moisturized and help  further relieve irritation/discomfort.    Return or go to the Emergency Department if symptoms worsen or do not improve in the next few days  

## 2022-07-22 ENCOUNTER — Encounter
Admission: RE | Admit: 2022-07-22 | Discharge: 2022-07-22 | Disposition: A | Discharge status: Home / Self Care | Payer: Medicare HMO | Source: Ambulatory Visit | Attending: Surgery | Admitting: Surgery

## 2022-07-22 ENCOUNTER — Other Ambulatory Visit: Payer: Self-pay

## 2022-07-22 DIAGNOSIS — Z01812 Encounter for preprocedural laboratory examination: Secondary | ICD-10-CM

## 2022-07-22 HISTORY — DX: Pneumonia, unspecified organism: J18.9

## 2022-07-22 HISTORY — DX: Dyspnea, unspecified: R06.00

## 2022-07-22 NOTE — Patient Instructions (Addendum)
Your procedure is scheduled on: 07/29/22 - Tuesday Report to the Registration Desk on the 1st floor of the [facility]. To find out your arrival time, please call [phone] between 1PM - 3PM on: 07/28/22 - Monday If your arrival time is 6:00 am, do not arrive prior to that time as the [facility] entrance doors do not open until 6:00 am.  REMEMBER: Instructions that are not followed completely may result in serious medical risk, up to and including death; or upon the discretion of your surgeon and anesthesiologist your surgery may need to be rescheduled.  FOLLOW BOWEL PREP INSTRUCTIONS given to you by Dr. Dahlia Byes,   Take medication as prescribed to you by Dr. Dahlia Byes.  TAKE THESE MEDICATIONS THE MORNING OF SURGERY WITH A SIP OF WATER: NONE  One week prior to surgery: Stop Anti-inflammatories (NSAIDS) such as Advil, Aleve, Ibuprofen, Motrin, Naproxen, Naprosyn and Aspirin based products such as Excedrin, [place]ys Powder, BC Powder.  Stop ANY OVER THE COUNTER supplements until after surgery.  You may however, continue to take Tylenol if needed for pain up until the day of surgery.  No Alcohol for 24 hours before or after surgery.  No Smoking including e-cigarettes for 24 hours prior to surgery.  No chewable tobacco products for at least 6 hours prior to surgery.  No nicotine patches on the day of surgery.  Do not use any "recreational" drugs for at least a week prior to your surgery.  Please be advised that the combination of cocaine and anesthesia may have negative outcomes, up to and including death. If you test positive for cocaine, your surgery will be cancelled.  On the morning of surgery brush your teeth with toothpaste and water, you may rinse your mouth with mouthwash if you wish. Do not swallow any toothpaste or mouthwash.  Use CHG Soap or wipes as directed on instruction sheet.  Do not wear jewelry, make-up, hairpins, clips or nail polish.  Do not wear lotions, powders,  or perfumes.   Do not shave body from the neck down 48 hours prior to surgery just in case you cut yourself which could leave a site for infection.  Also, freshly shaved skin may become irritated if using the CHG soap.  Contact lenses, hearing aids and dentures may not be worn into surgery.  Do not bring valuables to the hospital. [facility] is not responsible for any missing/lost belongings or valuables.   Notify your doctor if there is any change in your medical condition (cold, fever, infection).  Wear comfortable clothing (specific to your surgery type) to the hospital.  After surgery, you can help prevent lung complications by doing breathing exercises.  Take deep breaths and cough every 1-2 hours. Your doctor may order a device called an Incentive Spirometer to help you take deep breaths. When coughing or sneezing, hold a pillow firmly against your incision with both hands. This is called "splinting." Doing this helps protect your incision. It also decreases belly discomfort.  If you are being admitted to the hospital overnight, leave your suitcase in the car. After surgery it may be brought to your room.  If you are being discharged the day of surgery, you will not be allowed to drive home. You will need a responsible adult (18 years or older) to drive you home and stay with you that night.   If you are taking public transportation, you will need to have a responsible adult (18 years or older) with you. Please confirm with  your physician that it is acceptable to use public transportation.   Please call the [facility] Dept[facility] at [phone] if you have any questions about these instructions.  Surgery Visitation Policy:  Patients undergoing a surgery or procedure may have two family members or support persons with them as long as the person is not COVID-19 positive or experiencing its symptoms.   Inpatient Visitation:    Visiting hours are 7 a.m. to 8 p.m. Up to  four visitors are allowed at one time in a patient room. The visitors may rotate out with other people during the day. One designated support person (adult) may remain overnight.  Due to an increase in RSV and influenza rates and associated hospitalizations, children ages 39 and under will not be able to visit patients in [facility] Masks continue to be strongly recommended.

## 2022-07-23 ENCOUNTER — Encounter: Payer: Self-pay | Admitting: Oncology

## 2022-07-24 ENCOUNTER — Inpatient Hospital Stay: Payer: Medicare HMO | Attending: Oncology

## 2022-07-24 ENCOUNTER — Encounter
Admission: RE | Admit: 2022-07-24 | Discharge: 2022-07-24 | Disposition: A | Discharge status: Home / Self Care | Payer: Medicare HMO | Source: Ambulatory Visit | Attending: Surgery | Admitting: Surgery

## 2022-07-24 VITALS — BP 97/54 | HR 91 | Temp 98.8°F | Resp 18

## 2022-07-24 DIAGNOSIS — D500 Iron deficiency anemia secondary to blood loss (chronic): Secondary | ICD-10-CM | POA: Insufficient documentation

## 2022-07-24 DIAGNOSIS — Z01812 Encounter for preprocedural laboratory examination: Secondary | ICD-10-CM

## 2022-07-24 DIAGNOSIS — R9431 Abnormal electrocardiogram [ECG] [EKG]: Secondary | ICD-10-CM | POA: Insufficient documentation

## 2022-07-24 DIAGNOSIS — C186 Malignant neoplasm of descending colon: Secondary | ICD-10-CM | POA: Insufficient documentation

## 2022-07-24 DIAGNOSIS — Z01818 Encounter for other preprocedural examination: Secondary | ICD-10-CM | POA: Insufficient documentation

## 2022-07-24 MED ORDER — SODIUM CHLORIDE 0.9 % IV SOLN
Freq: Once | INTRAVENOUS | Status: AC
  Filled 2022-07-24: qty 250

## 2022-07-24 MED ORDER — SODIUM CHLORIDE 0.9 % IV SOLN
200.0000 mg | Freq: Once | INTRAVENOUS | Status: AC
  Administered 2022-07-24: 200 mg via INTRAVENOUS
  Filled 2022-07-24: qty 200

## 2022-07-28 ENCOUNTER — Telehealth: Payer: Self-pay

## 2022-07-28 ENCOUNTER — Ambulatory Visit: Payer: Medicare HMO

## 2022-07-28 NOTE — Telephone Encounter (Signed)
Spoke with patients husband patient is feeling better after drinking pedialyte . Per Dr.Pabon please proceed with bowel prep and stay hydrated.

## 2022-07-29 ENCOUNTER — Other Ambulatory Visit: Payer: Self-pay

## 2022-07-29 ENCOUNTER — Encounter
Admission: RE | Disposition: A | Discharge status: Home Health | Payer: Self-pay | Source: Ambulatory Visit | Attending: Surgery

## 2022-07-29 ENCOUNTER — Inpatient Hospital Stay
Admission: RE | Admit: 2022-07-29 | Discharge: 2022-08-01 | DRG: 330 | Disposition: A | Discharge status: Home Health | Payer: Medicare HMO | Source: Ambulatory Visit | Attending: Surgery | Admitting: Surgery

## 2022-07-29 ENCOUNTER — Inpatient Hospital Stay: Payer: Medicare HMO | Admitting: Certified Registered"

## 2022-07-29 ENCOUNTER — Encounter: Payer: Self-pay | Admitting: Surgery

## 2022-07-29 DIAGNOSIS — Z9049 Acquired absence of other specified parts of digestive tract: Principal | ICD-10-CM

## 2022-07-29 DIAGNOSIS — D630 Anemia in neoplastic disease: Secondary | ICD-10-CM | POA: Diagnosis present

## 2022-07-29 DIAGNOSIS — Z823 Family history of stroke: Secondary | ICD-10-CM

## 2022-07-29 DIAGNOSIS — Z888 Allergy status to other drugs, medicaments and biological substances status: Secondary | ICD-10-CM

## 2022-07-29 DIAGNOSIS — Z853 Personal history of malignant neoplasm of breast: Secondary | ICD-10-CM

## 2022-07-29 DIAGNOSIS — C7989 Secondary malignant neoplasm of other specified sites: Secondary | ICD-10-CM | POA: Diagnosis present

## 2022-07-29 DIAGNOSIS — K449 Diaphragmatic hernia 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 gangrene: Secondary | ICD-10-CM | POA: Diagnosis present

## 2022-07-29 DIAGNOSIS — C189 Malignant neoplasm of colon, unspecified: Secondary | ICD-10-CM | POA: Diagnosis present

## 2022-07-29 DIAGNOSIS — N179 Acute kidney failure, unspecified: Secondary | ICD-10-CM | POA: Diagnosis present

## 2022-07-29 DIAGNOSIS — Z820 Family history of epilepsy and other diseases of the nervous system: Secondary | ICD-10-CM | POA: Diagnosis not present

## 2022-07-29 DIAGNOSIS — E876 Hypokalemia: Secondary | ICD-10-CM | POA: Diagnosis present

## 2022-07-29 DIAGNOSIS — Z801 Family history of malignant neoplasm of trachea, bronchus and lung: Secondary | ICD-10-CM

## 2022-07-29 DIAGNOSIS — Z803 Family history of malignant neoplasm of breast: Secondary | ICD-10-CM | POA: Diagnosis not present

## 2022-07-29 DIAGNOSIS — C186 Malignant neoplasm of descending colon: Principal | ICD-10-CM | POA: Diagnosis present

## 2022-07-29 DIAGNOSIS — D72829 Elevated white blood cell count, unspecified: Secondary | ICD-10-CM | POA: Diagnosis present

## 2022-07-29 DIAGNOSIS — E861 Hypovolemia: Secondary | ICD-10-CM | POA: Diagnosis present

## 2022-07-29 HISTORY — PX: PARTIAL COLECTOMY: SHX5273

## 2022-07-29 LAB — ABO/RH: ABO/RH(D): O NEG

## 2022-07-29 SURGERY — COLECTOMY, PARTIAL
Anesthesia: General

## 2022-07-29 MED ORDER — ALBUMIN HUMAN 25 % IV SOLN
INTRAVENOUS | Status: AC
  Filled 2022-07-29: qty 50

## 2022-07-29 MED ORDER — ALBUMIN HUMAN 5 % IV SOLN: INTRAVENOUS | Status: DC | PRN

## 2022-07-29 MED ORDER — APREPITANT 40 MG PO CAPS: 40.0000 mg | ORAL_CAPSULE | Freq: Once | ORAL | Status: AC

## 2022-07-29 MED ORDER — PANTOPRAZOLE SODIUM 40 MG IV SOLR
40.0000 mg | Freq: Every day | INTRAVENOUS | Status: DC
  Administered 2022-07-29 – 2022-07-31 (×3): 40 mg via INTRAVENOUS
  Filled 2022-07-29 (×4): qty 10

## 2022-07-29 MED ORDER — DEXAMETHASONE SODIUM PHOSPHATE 10 MG/ML IJ SOLN
INTRAMUSCULAR | Status: DC | PRN
  Administered 2022-07-29: 4 mg via INTRAVENOUS

## 2022-07-29 MED ORDER — ONDANSETRON HCL 4 MG/2ML IJ SOLN
INTRAMUSCULAR | Status: DC | PRN
  Administered 2022-07-29: 4 mg via INTRAVENOUS

## 2022-07-29 MED ORDER — "VISTASEAL 4 ML SINGLE DOSE KIT "
PACK | CUTANEOUS | Status: DC | PRN
  Administered 2022-07-29: 8 mL via TOPICAL

## 2022-07-29 MED ORDER — FENTANYL CITRATE (PF) 100 MCG/2ML IJ SOLN
INTRAMUSCULAR | Status: AC
  Filled 2022-07-29: qty 2

## 2022-07-29 MED ORDER — ROCURONIUM BROMIDE 10 MG/ML (PF) SYRINGE
PREFILLED_SYRINGE | INTRAVENOUS | Status: AC
  Filled 2022-07-29: qty 10

## 2022-07-29 MED ORDER — GLYCOPYRROLATE 0.2 MG/ML IJ SOLN
INTRAMUSCULAR | Status: AC
  Filled 2022-07-29: qty 2

## 2022-07-29 MED ORDER — SODIUM CHLORIDE 0.9 % IV SOLN
INTRAVENOUS | Status: AC
  Filled 2022-07-29: qty 2

## 2022-07-29 MED ORDER — CELECOXIB 200 MG PO CAPS: 200.0000 mg | ORAL_CAPSULE | ORAL | Status: AC

## 2022-07-29 MED ORDER — SODIUM CHLORIDE 0.9% IV SOLUTION: Freq: Once | INTRAVENOUS | Status: DC

## 2022-07-29 MED ORDER — ONDANSETRON HCL 4 MG/2ML IJ SOLN: 4.0000 mg | Freq: Once | INTRAMUSCULAR | Status: DC | PRN

## 2022-07-29 MED ORDER — OXYCODONE HCL 5 MG PO TABS
ORAL_TABLET | ORAL | Status: AC
  Filled 2022-07-29: qty 1

## 2022-07-29 MED ORDER — PROCHLORPERAZINE EDISYLATE 10 MG/2ML IJ SOLN: 5.0000 mg | Freq: Four times a day (QID) | INTRAMUSCULAR | Status: DC | PRN

## 2022-07-29 MED ORDER — LIDOCAINE HCL (PF) 2 % IJ SOLN
INTRAMUSCULAR | Status: AC
  Filled 2022-07-29: qty 5

## 2022-07-29 MED ORDER — ALBUMIN HUMAN 5 % IV SOLN
INTRAVENOUS | Status: AC
  Filled 2022-07-29: qty 250

## 2022-07-29 MED ORDER — ACETAMINOPHEN 500 MG PO TABS: 1000.0000 mg | ORAL_TABLET | ORAL | Status: AC

## 2022-07-29 MED ORDER — ACETAMINOPHEN 500 MG PO TABS
ORAL_TABLET | ORAL | Status: AC
  Administered 2022-07-29: 1000 mg via ORAL
  Filled 2022-07-29: qty 2

## 2022-07-29 MED ORDER — CHLORHEXIDINE GLUCONATE 0.12 % MT SOLN: 15.0000 mL | Freq: Once | OROMUCOSAL | Status: AC

## 2022-07-29 MED ORDER — GABAPENTIN 300 MG PO CAPS
ORAL_CAPSULE | ORAL | Status: AC
  Administered 2022-07-29: 300 mg via ORAL
  Filled 2022-07-29: qty 1

## 2022-07-29 MED ORDER — PHENYLEPHRINE 80 MCG/ML (10ML) SYRINGE FOR IV PUSH (FOR BLOOD PRESSURE SUPPORT)
PREFILLED_SYRINGE | INTRAVENOUS | Status: AC
  Filled 2022-07-29: qty 10

## 2022-07-29 MED ORDER — EPHEDRINE 5 MG/ML INJ
INTRAVENOUS | Status: AC
  Filled 2022-07-29: qty 5

## 2022-07-29 MED ORDER — OXYCODONE HCL 5 MG PO TABS
5.0000 mg | ORAL_TABLET | ORAL | Status: DC | PRN
  Administered 2022-07-30: 5 mg via ORAL
  Filled 2022-07-29: qty 1

## 2022-07-29 MED ORDER — ACETAMINOPHEN 500 MG PO TABS
1000.0000 mg | ORAL_TABLET | Freq: Four times a day (QID) | ORAL | Status: DC
  Administered 2022-07-29 – 2022-08-01 (×10): 1000 mg via ORAL
  Filled 2022-07-29 (×11): qty 2

## 2022-07-29 MED ORDER — SODIUM CHLORIDE (PF) 0.9 % IJ SOLN
INTRAMUSCULAR | Status: AC
  Filled 2022-07-29: qty 50

## 2022-07-29 MED ORDER — METHOCARBAMOL 500 MG PO TABS
500.0000 mg | ORAL_TABLET | Freq: Three times a day (TID) | ORAL | Status: DC | PRN
  Administered 2022-07-29 – 2022-07-30 (×2): 500 mg via ORAL
  Filled 2022-07-29 (×2): qty 1

## 2022-07-29 MED ORDER — CHLORHEXIDINE GLUCONATE CLOTH 2 % EX PADS
6.0000 | MEDICATED_PAD | Freq: Once | CUTANEOUS | Status: AC
  Administered 2022-07-29: 6 via TOPICAL

## 2022-07-29 MED ORDER — ACETAMINOPHEN 500 MG PO TABS
ORAL_TABLET | ORAL | Status: AC
  Filled 2022-07-29: qty 2

## 2022-07-29 MED ORDER — FENTANYL CITRATE (PF) 100 MCG/2ML IJ SOLN
25.0000 ug | INTRAMUSCULAR | Status: DC | PRN
  Administered 2022-07-29 (×4): 25 ug via INTRAVENOUS

## 2022-07-29 MED ORDER — ORAL CARE MOUTH RINSE: 15.0000 mL | Freq: Once | OROMUCOSAL | Status: AC

## 2022-07-29 MED ORDER — LACTATED RINGERS IV SOLN: INTRAVENOUS | Status: DC

## 2022-07-29 MED ORDER — HYDRALAZINE HCL 20 MG/ML IJ SOLN: 10.0000 mg | INTRAMUSCULAR | Status: DC | PRN

## 2022-07-29 MED ORDER — SODIUM CHLORIDE 0.9 % IV SOLN
2.0000 g | Freq: Three times a day (TID) | INTRAVENOUS | Status: AC
  Administered 2022-07-29: 2 g via INTRAVENOUS
  Filled 2022-07-29 (×2): qty 2

## 2022-07-29 MED ORDER — PROPOFOL 10 MG/ML IV BOLUS
INTRAVENOUS | Status: DC | PRN
  Administered 2022-07-29: 60 mg via INTRAVENOUS
  Administered 2022-07-29: 30 mg via INTRAVENOUS

## 2022-07-29 MED ORDER — FENTANYL CITRATE (PF) 100 MCG/2ML IJ SOLN
INTRAMUSCULAR | Status: DC | PRN
  Administered 2022-07-29: 25 ug via INTRAVENOUS
  Administered 2022-07-29: 50 ug via INTRAVENOUS
  Administered 2022-07-29 (×5): 25 ug via INTRAVENOUS

## 2022-07-29 MED ORDER — GLYCOPYRROLATE 0.2 MG/ML IJ SOLN
INTRAMUSCULAR | Status: DC | PRN
  Administered 2022-07-29: .1 mg via INTRAVENOUS

## 2022-07-29 MED ORDER — PROPOFOL 10 MG/ML IV BOLUS
INTRAVENOUS | Status: AC
  Filled 2022-07-29: qty 20

## 2022-07-29 MED ORDER — MELATONIN 5 MG PO TABS
5.0000 mg | ORAL_TABLET | Freq: Every evening | ORAL | Status: DC | PRN
  Administered 2022-07-30: 5 mg via ORAL
  Filled 2022-07-29 (×2): qty 1

## 2022-07-29 MED ORDER — ALVIMOPAN 12 MG PO CAPS: 12.0000 mg | ORAL_CAPSULE | ORAL | Status: DC

## 2022-07-29 MED ORDER — ALVIMOPAN 12 MG PO CAPS: 12.0000 mg | ORAL_CAPSULE | ORAL | Status: AC

## 2022-07-29 MED ORDER — OXYCODONE HCL 5 MG/5ML PO SOLN: 5.0000 mg | Freq: Once | ORAL | Status: AC | PRN

## 2022-07-29 MED ORDER — ONDANSETRON HCL 4 MG/2ML IJ SOLN
INTRAMUSCULAR | Status: AC
  Filled 2022-07-29: qty 2

## 2022-07-29 MED ORDER — SODIUM CHLORIDE 0.9 % IV SOLN
INTRAVENOUS | Status: AC
  Administered 2022-07-29: 2 g
  Filled 2022-07-29: qty 2

## 2022-07-29 MED ORDER — SODIUM CHLORIDE (PF) 0.9 % IJ SOLN
INTRAMUSCULAR | Status: DC | PRN
  Administered 2022-07-29: 100 mL via SURGICAL_CAVITY

## 2022-07-29 MED ORDER — ONDANSETRON 4 MG PO TBDP: 4.0000 mg | ORAL_TABLET | Freq: Four times a day (QID) | ORAL | Status: DC | PRN

## 2022-07-29 MED ORDER — FAMOTIDINE 20 MG PO TABS
ORAL_TABLET | ORAL | Status: AC
  Administered 2022-07-29: 20 mg via ORAL
  Filled 2022-07-29: qty 1

## 2022-07-29 MED ORDER — SODIUM CHLORIDE 0.9 % IV SOLN
2.0000 g | INTRAVENOUS | Status: AC
  Administered 2022-07-29: 2 g via INTRAVENOUS

## 2022-07-29 MED ORDER — ALVIMOPAN 12 MG PO CAPS
ORAL_CAPSULE | ORAL | Status: AC
  Administered 2022-07-29: 12 mg via ORAL
  Filled 2022-07-29: qty 1

## 2022-07-29 MED ORDER — APREPITANT 40 MG PO CAPS
ORAL_CAPSULE | ORAL | Status: AC
  Administered 2022-07-29: 40 mg via ORAL
  Filled 2022-07-29: qty 1

## 2022-07-29 MED ORDER — ROCURONIUM BROMIDE 100 MG/10ML IV SOLN
INTRAVENOUS | Status: DC | PRN
  Administered 2022-07-29: 15 mg via INTRAVENOUS
  Administered 2022-07-29: 20 mg via INTRAVENOUS
  Administered 2022-07-29: 30 mg via INTRAVENOUS

## 2022-07-29 MED ORDER — CHLORHEXIDINE GLUCONATE CLOTH 2 % EX PADS
6.0000 | MEDICATED_PAD | Freq: Once | CUTANEOUS | Status: DC
  Administered 2022-07-29: 6 via TOPICAL

## 2022-07-29 MED ORDER — SUGAMMADEX SODIUM 200 MG/2ML IV SOLN
INTRAVENOUS | Status: DC | PRN
  Administered 2022-07-29: 200 mg via INTRAVENOUS

## 2022-07-29 MED ORDER — FAMOTIDINE 20 MG PO TABS: 20.0000 mg | ORAL_TABLET | Freq: Once | ORAL | Status: AC

## 2022-07-29 MED ORDER — PHENYLEPHRINE HCL-NACL 20-0.9 MG/250ML-% IV SOLN
INTRAVENOUS | Status: AC
  Filled 2022-07-29: qty 250

## 2022-07-29 MED ORDER — DIPHENHYDRAMINE HCL 12.5 MG/5ML PO ELIX: 12.5000 mg | ORAL_SOLUTION | Freq: Four times a day (QID) | ORAL | Status: DC | PRN

## 2022-07-29 MED ORDER — ENOXAPARIN SODIUM 40 MG/0.4ML IJ SOSY
40.0000 mg | PREFILLED_SYRINGE | INTRAMUSCULAR | Status: DC
  Administered 2022-07-30 – 2022-07-31 (×2): 40 mg via SUBCUTANEOUS
  Filled 2022-07-29 (×3): qty 0.4

## 2022-07-29 MED ORDER — PHENYLEPHRINE HCL-NACL 20-0.9 MG/250ML-% IV SOLN
INTRAVENOUS | Status: DC | PRN
  Administered 2022-07-29: 10 ug/min via INTRAVENOUS

## 2022-07-29 MED ORDER — SODIUM CHLORIDE 0.9 % IV SOLN: INTRAVENOUS | Status: DC

## 2022-07-29 MED ORDER — BUPIVACAINE LIPOSOME 1.3 % IJ SUSP
INTRAMUSCULAR | Status: AC
  Filled 2022-07-29: qty 20

## 2022-07-29 MED ORDER — DEXAMETHASONE SODIUM PHOSPHATE 10 MG/ML IJ SOLN
INTRAMUSCULAR | Status: AC
  Filled 2022-07-29: qty 1

## 2022-07-29 MED ORDER — MORPHINE SULFATE (PF) 2 MG/ML IV SOLN
2.0000 mg | INTRAVENOUS | Status: DC | PRN
  Administered 2022-07-29: 2 mg via INTRAVENOUS
  Filled 2022-07-29: qty 1

## 2022-07-29 MED ORDER — PROMETHAZINE-DM 6.25-15 MG/5ML PO SYRP: 2.5000 mL | ORAL_SOLUTION | Freq: Four times a day (QID) | ORAL | Status: DC | PRN

## 2022-07-29 MED ORDER — "VISTASEAL 4 ML SINGLE DOSE KIT "
PACK | CUTANEOUS | Status: AC
  Filled 2022-07-29: qty 8

## 2022-07-29 MED ORDER — CELECOXIB 200 MG PO CAPS
ORAL_CAPSULE | ORAL | Status: AC
  Administered 2022-07-29: 200 mg via ORAL
  Filled 2022-07-29: qty 1

## 2022-07-29 MED ORDER — DIPHENHYDRAMINE HCL 50 MG/ML IJ SOLN: 12.5000 mg | Freq: Four times a day (QID) | INTRAMUSCULAR | Status: DC | PRN

## 2022-07-29 MED ORDER — PROCHLORPERAZINE MALEATE 10 MG PO TABS: 10.0000 mg | ORAL_TABLET | Freq: Four times a day (QID) | ORAL | Status: DC | PRN

## 2022-07-29 MED ORDER — ACETAMINOPHEN 10 MG/ML IV SOLN: 1000.0000 mg | Freq: Once | INTRAVENOUS | Status: DC | PRN

## 2022-07-29 MED ORDER — ONDANSETRON HCL 4 MG/2ML IJ SOLN: 4.0000 mg | Freq: Four times a day (QID) | INTRAMUSCULAR | Status: DC | PRN

## 2022-07-29 MED ORDER — MIDAZOLAM HCL 2 MG/2ML IJ SOLN
INTRAMUSCULAR | Status: AC
  Filled 2022-07-29: qty 2

## 2022-07-29 MED ORDER — BUPIVACAINE-EPINEPHRINE (PF) 0.5% -1:200000 IJ SOLN
INTRAMUSCULAR | Status: AC
  Filled 2022-07-29: qty 30

## 2022-07-29 MED ORDER — METHOCARBAMOL 1000 MG/10ML IJ SOLN: 500.0000 mg | Freq: Three times a day (TID) | INTRAVENOUS | Status: DC | PRN

## 2022-07-29 MED ORDER — KETOROLAC TROMETHAMINE 15 MG/ML IJ SOLN
15.0000 mg | Freq: Four times a day (QID) | INTRAMUSCULAR | Status: DC
  Administered 2022-07-29 – 2022-07-31 (×9): 15 mg via INTRAVENOUS
  Filled 2022-07-29 (×10): qty 1

## 2022-07-29 MED ORDER — CHLORHEXIDINE GLUCONATE 0.12 % MT SOLN
OROMUCOSAL | Status: AC
  Administered 2022-07-29: 15 mL via OROMUCOSAL
  Filled 2022-07-29: qty 15

## 2022-07-29 MED ORDER — GABAPENTIN 300 MG PO CAPS: 300.0000 mg | ORAL_CAPSULE | ORAL | Status: AC

## 2022-07-29 MED ORDER — OXYCODONE HCL 5 MG PO TABS
5.0000 mg | ORAL_TABLET | Freq: Once | ORAL | Status: AC | PRN
  Administered 2022-07-29: 5 mg via ORAL

## 2022-07-29 MED ORDER — KETOROLAC TROMETHAMINE 15 MG/ML IJ SOLN
INTRAMUSCULAR | Status: AC
  Filled 2022-07-29: qty 1

## 2022-07-29 SURGICAL SUPPLY — 53 items
ADH LQ OCL WTPRF AMP STRL LF (MISCELLANEOUS) ×1
ADHESIVE MASTISOL STRL (MISCELLANEOUS) IMPLANT
APL PRP STRL LF DISP 70% ISPRP (MISCELLANEOUS) ×1
BARRIER ADH SEPRAFILM 3INX5IN (MISCELLANEOUS) IMPLANT
BRR ADH 5X3 SEPRAFILM 2 SHT (MISCELLANEOUS) ×2
CHLORAPREP W/TINT 26 (MISCELLANEOUS) ×1 IMPLANT
DRAPE LAPAROTOMY 100X77 ABD (DRAPES) ×1 IMPLANT
DRSG OPSITE POSTOP 4X12 (GAUZE/BANDAGES/DRESSINGS) IMPLANT
DRSG TEGADERM 2-3/8X2-3/4 SM (GAUZE/BANDAGES/DRESSINGS) IMPLANT
ELECT EZSTD 165MM 6.5IN (MISCELLANEOUS) ×1
ELECT REM PT RETURN 9FT ADLT (ELECTROSURGICAL) ×1
ELECTRODE EZSTD 165MM 6.5IN (MISCELLANEOUS) ×1 IMPLANT
ELECTRODE REM PT RTRN 9FT ADLT (ELECTROSURGICAL) ×1 IMPLANT
GAUZE 4X4 16PLY ~~LOC~~+RFID DBL (SPONGE) ×1 IMPLANT
GAUZE SPONGE 4X4 12PLY STRL (GAUZE/BANDAGES/DRESSINGS) ×1 IMPLANT
GLOVE BIO SURGEON STRL SZ7 (GLOVE) ×2 IMPLANT
GOWN STRL REUS W/ TWL LRG LVL3 (GOWN DISPOSABLE) ×4 IMPLANT
GOWN STRL REUS W/TWL LRG LVL3 (GOWN DISPOSABLE) ×8
HEMOSTAT SURGICEL 2X14 (HEMOSTASIS) IMPLANT
KIT OSTOMY 2 PC DRNBL 2.25 STR (WOUND CARE) IMPLANT
KIT OSTOMY DRAINABLE 2.25 STR (WOUND CARE) ×1
KIT TURNOVER KIT A (KITS) ×1 IMPLANT
LABEL OR SOLS (LABEL) ×1 IMPLANT
LIGASURE IMPACT 36 18CM CVD LR (INSTRUMENTS) ×1 IMPLANT
LOOP VESSEL MINI RED (MISCELLANEOUS) IMPLANT
MANIFOLD NEPTUNE II (INSTRUMENTS) ×1 IMPLANT
NEEDLE HYPO 22GX1.5 SAFETY (NEEDLE) ×1 IMPLANT
NS IRRIG 1000ML POUR BTL (IV SOLUTION) ×1 IMPLANT
PACK BASIN MAJOR ARMC (MISCELLANEOUS) ×1 IMPLANT
PACK COLON CLEAN CLOSURE (MISCELLANEOUS) ×1 IMPLANT
RELOAD STAPLE 60 2.6 WHT THN (STAPLE) IMPLANT
RELOAD STAPLE 60 3.6 BLU REG (STAPLE) IMPLANT
RELOAD STAPLER BLUE 60MM (STAPLE) ×2 IMPLANT
RELOAD STAPLER WHITE 60MM (STAPLE) ×2 IMPLANT
SPONGE KITTNER 5P (MISCELLANEOUS) IMPLANT
SPONGE T-LAP 18X18 ~~LOC~~+RFID (SPONGE) ×5 IMPLANT
SPONGE T-LAP 18X36 ~~LOC~~+RFID STR (SPONGE) IMPLANT
STAPLE ECHEON FLEX 60 POW ENDO (STAPLE) IMPLANT
STAPLER CVD CUT BL 40 RELOAD (ENDOMECHANICALS) IMPLANT
STAPLER CVD CUT BLU 40 RELOAD (ENDOMECHANICALS) ×1 IMPLANT
STAPLER RELOAD BLUE 60MM (STAPLE) ×2
STAPLER RELOAD WHITE 60MM (STAPLE) ×2
STAPLER SKIN PROX 35W (STAPLE) ×1 IMPLANT
SUT PDS AB 0 CT1 27 (SUTURE) ×3 IMPLANT
SUT SILK 2 0 (SUTURE)
SUT SILK 2 0SH CR/8 30 (SUTURE) ×1 IMPLANT
SUT SILK 2-0 18XBRD TIE 12 (SUTURE) ×1 IMPLANT
SUT VIC AB 3-0 SH 27 (SUTURE) ×4
SUT VIC AB 3-0 SH 27X BRD (SUTURE) ×1 IMPLANT
SYR 20ML LL LF (SYRINGE) ×1 IMPLANT
TRAP FLUID SMOKE EVACUATOR (MISCELLANEOUS) ×1 IMPLANT
TRAY FOLEY MTR SLVR 16FR STAT (SET/KITS/TRAYS/PACK) ×1 IMPLANT
WATER STERILE IRR 500ML POUR (IV SOLUTION) ×1 IMPLANT

## 2022-07-29 NOTE — Anesthesia Procedure Notes (Signed)
Procedure Name: Intubation Date/Time: 07/29/2022 12:38 PM  Performed by: Jerrye Noble, CRNAPre-anesthesia Checklist: Patient identified, Emergency Drugs available, Suction available and Patient being monitored Patient Re-evaluated:Patient Re-evaluated prior to induction Oxygen Delivery Method: Circle system utilized Preoxygenation: Pre-oxygenation with 100% oxygen Induction Type: IV induction Ventilation: Mask ventilation without difficulty Laryngoscope Size: McGraph and 3 Grade View: Grade I Tube type: Oral Tube size: 6.5 mm Number of attempts: 1 Airway Equipment and Method: Stylet, Oral airway and Video-laryngoscopy Placement Confirmation: ETT inserted through vocal cords under direct vision, positive ETCO2 and breath sounds checked- equal and bilateral Secured at: 19 cm Tube secured with: Tape Dental Injury: Teeth and Oropharynx as per pre-operative assessment

## 2022-07-29 NOTE — Anesthesia Preprocedure Evaluation (Addendum)
Anesthesia Evaluation  Patient identified by MRN, date of birth, ID band Patient awake    Reviewed: Allergy & Precautions, NPO status , Patient's Chart, lab work & pertinent test results  History of Anesthesia Complications Negative for: history of anesthetic complications  Airway Mallampati: I   Neck ROM: Full    Dental  (+) Missing   Pulmonary shortness of breath (since COVID diagnosis) COVID+ 07/19/22   Pulmonary exam normal breath sounds clear to auscultation       Cardiovascular hypertension, Normal cardiovascular exam Rhythm:Regular Rate:Normal  ECG 07/24/22:  Normal sinus rhythm Nonspecific ST abnormality   Neuro/Psych  Headaches    GI/Hepatic negative GI ROS,,,  Endo/Other  negative endocrine ROS    Renal/GU negative Renal ROS     Musculoskeletal   Abdominal   Peds  Hematology  (+) Blood dyscrasia, anemia Breast CA   Anesthesia Other Findings   Reproductive/Obstetrics                             Anesthesia Physical Anesthesia Plan  ASA: 3  Anesthesia Plan: General   Post-op Pain Management:    Induction: Intravenous  PONV Risk Score and Plan: 3 and Ondansetron, Dexamethasone and Treatment may vary due to age or medical condition  Airway Management Planned: Oral ETT  Additional Equipment:   Intra-op Plan:   Post-operative Plan: Extubation in OR and Possible Post-op intubation/ventilation  Informed Consent: I have reviewed the patients History and Physical, chart, labs and discussed the procedure including the risks, benefits and alternatives for the proposed anesthesia with the patient or authorized representative who has indicated his/her understanding and acceptance.     Dental advisory given  Plan Discussed with: CRNA  Anesthesia Plan Comments: (Patient consented for risks of anesthesia including but not limited to:  - adverse reactions to medications -  damage to eyes, teeth, lips or other oral mucosa - nerve damage due to positioning  - sore throat or hoarseness - damage to heart, brain, nerves, lungs, other parts of body or loss of life  Discussed possibility of postoperative intubation given recent COVID and shortness of breath.  Patient understands and wishes to proceed.  Informed patient about role of CRNA in peri- and intra-operative care.  Patient voiced understanding.)        Anesthesia Quick Evaluation

## 2022-07-29 NOTE — Interval H&P Note (Signed)
History and Physical Interval Note:  07/29/2022 11:58 AM  Kristin Miller  has presented today for surgery, with the diagnosis of colon cancer.  The various methods of treatment have been discussed with the patient and family. After consideration of risks, benefits and other options for treatment, the patient has consented to  Procedure(s): PARTIAL COLECTOMY, open left, RNFA to assist (N/A) as a surgical intervention.  The patient's history has been reviewed, patient examined, no change in status, stable for surgery.  I have reviewed the patient's chart and labs.  Questions were answered to the patient's satisfaction.   I  had another Conversation with the patient and the family regarding her disease process.  She is frail and weak.  On the other hand she has a large cancerous that is certainly causing symptoms to include anemia and is contributing to her overall weakness.  Options of postponing the surgery versus proceeding or discussed with her and the husband in detail.  I do not think there is a good answer.  I do not think that we will get her in any better shape we operatively and she may obstruct requiring emergency surgery which will even increase even more her perioperative risk of morbidity and mortality. Also discussed with anesthesiologist in detail about my thought process. We will proceed.  There is no guarantees I will definitely not do a primary anastomosis.  She also understands that we may not be able to resect the cancer and that I may just  do diverting ostomy.   [place]

## 2022-07-29 NOTE — Transfer of Care (Signed)
Immediate Anesthesia Transfer of Care Note  Patient: Kristin Miller  Procedure(s) Performed: PARTIAL COLECTOMY, open left, RNFA to assist  Patient Location: PACU  Anesthesia Type:General  Level of Consciousness: awake, alert , oriented, and drowsy  Airway & Oxygen Therapy: Patient Spontanous Breathing and Patient connected to face mask oxygen  Post-op Assessment: Report given to RN and Post -op Vital signs reviewed and stable  Post vital signs: Reviewed and stable  Last Vitals:  Vitals Value Taken Time  BP 119/51 07/29/22 1645  Temp 35.6 C 07/29/22 1640  Pulse 65 07/29/22 1647  Resp 19 07/29/22 1647  SpO2 100 % 07/29/22 1647  Vitals shown include unvalidated device data.  Last Pain:  Vitals:   07/29/22 0944  TempSrc: Oral  PainSc: 0-No pain         Complications: No notable events documented.

## 2022-07-29 NOTE — Op Note (Addendum)
PROCEDURES: Open Low anterior resection with end colostomy Abdominal wall resection to include psoas and left transversus abdominus muscles measuring 10x10 cm segement Left Oophorectomy  Pre-operative Diagnosis: advanced descending colon cancer  Post-operative Diagnosis:same   Surgeon: Marjory Lies Mory Herrman   Assistants: Gladstone Lighter RNFA  Anesthesia: General endotracheal anesthesia  ASA Class: 4   Surgeon: Caroleen Hamman , MD FACS  Anesthesia: Gen. with endotracheal tube  Findings: Advance large descending colon cancer extending to the sigmoid colon and  involving the abdominal wall and pelvis to include psoas muscle and Transversus abdominus. Pelvic bone seemed to spare. In order to achieve or attempt to achieve  R0 resection psoas and Transversus abdominus muscle  and Ovary on the Left  side resected as en-Bloque resection No evidence of other distant intra-abdominal metastasis , no evidence of liver mets. R0 resection grossly achieved  Estimated Blood Loss: 100cc         Drains: none         Specimens: Colon with abdominal wall and left ovary          Complications: none         Condition: stable  Procedure Details  The patient was seen again in the Holding Room. The benefits, complications, treatment options, and expected outcomes were discussed with the patient. The risks of bleeding, infection, recurrence of symptoms, failure to resolve symptoms,  bowel injury, any of which could require further surgery were reviewed with the patient.   The patient was taken to Operating Room, identified as Kristin Miller and the procedure verified.  A Time Out was held and the above information confirmed.  Prior to the induction of general anesthesia, antibiotic prophylaxis was administered. VTE prophylaxis was in place. General endotracheal anesthesia was then administered and tolerated well. After the induction, the abdomen was prepped with Chloraprep and draped in the sterile fashion.  The patient was positioned in the supine position.  Generous midline laparotomy incision was used serous the intra-abdominal cavity there was no evidence of distant metastatic disease.  6 ascending colon mass extending into the cecum fixed left involving the posterior musculature.  There was evidence of occluded distal and proximal margin of resection.  There was no evidence of metastatic disease within the abdominal cavity. There was  involvement of the ovary from the tumor.  We proceeded ascending bloc resection of the ascending colon.  We were able to incise lateral to the white line of [place] to include the retroperitoneal to achieve our 0 resection.  This was noted with electrocautery.  We also were able to identify the left than the right.  I was able to protect them at all times.  An oophorectomy was performed with ligasure and we divided the round ligament as well. We found that the cancer was involving the Psoas muscle as well as the transversalis muscle on the left side as well as to the left ovary.  In order to achieve an R0 resection we needed to resect the musculature, this was done with a combination of electrocautery and the LigaSure device.  As we had a lateral dissection completed attention was turned to the medial side.  The inferior mesenteric artery was identified and was  ligated stapler device.  I  also resected the mesentery of the ascending colon to the distal rectum.  Mesorectal excision was created dissecting into the avascular plane of the pelvis.  We were able to get mesentery within the mesorectum.  This was achieved with LigaSure device,  I wanted to make sure we had a good distal and proximal margin.  Excelon 48m stapler was used to divide the mid rectum distally.An area of the proximal descending colon was divided with the stapler as well with at least 5 cm margin.The splenic flexure was taking down in the standard fashion with cautery  in order to facilitate dissection.  Due to  her fragility best option for her was an end colostomy decreasing chances of potential need perioperative morbidity/mortality. A full thickness defect in the left abdomen was chosen for the exteriorization of the colon we created defect with cautery and descending colon was exteriorize through the rectus.  We changed gloves and place a new tray to close the   abdomen with a 0 PDS suture in a running fashion small bite technique and the skin was closed with staples. Liposomal Marcaine  was injected on all incision sites under direct visualization. Sterile dressing applied. End colostomy matured in a brooke fashion w several 3-0 vicryl sutures.  Stoma appliance placed.   Needle and laparotomy count were correct and there were no immediate occasions  DCaroleen Hamman MD, FACS

## 2022-07-30 ENCOUNTER — Encounter: Payer: Self-pay | Admitting: Surgery

## 2022-07-30 LAB — COMPREHENSIVE METABOLIC PANEL
ALT: 11 U/L (ref 0–44)
AST: 21 U/L (ref 15–41)
Albumin: 2.7 g/dL — ABNORMAL LOW (ref 3.5–5.0)
Alkaline Phosphatase: 38 U/L (ref 38–126)
Anion gap: 10 (ref 5–15)
BUN: 14 mg/dL (ref 8–23)
CO2: 19 mmol/L — ABNORMAL LOW (ref 22–32)
Calcium: 7.9 mg/dL — ABNORMAL LOW (ref 8.9–10.3)
Chloride: 104 mmol/L (ref 98–111)
Creatinine, Ser: 1.07 mg/dL — ABNORMAL HIGH (ref 0.44–1.00)
GFR, Estimated: 51 mL/min — ABNORMAL LOW (ref >60)
Glucose, Bld: 150 mg/dL — ABNORMAL HIGH (ref 70–99)
Potassium: 3.2 mmol/L — ABNORMAL LOW (ref 3.5–5.1)
Sodium: 133 mmol/L — ABNORMAL LOW (ref 135–145)
Total Bilirubin: 0.4 mg/dL (ref 0.3–1.2)
Total Protein: 5.3 g/dL — ABNORMAL LOW (ref 6.5–8.1)

## 2022-07-30 LAB — CBC
HCT: 24.3 % — ABNORMAL LOW (ref 36.0–46.0)
Hemoglobin: 7.2 g/dL — ABNORMAL LOW (ref 12.0–15.0)
MCH: 22.1 pg — ABNORMAL LOW (ref 26.0–34.0)
MCHC: 29.6 g/dL — ABNORMAL LOW (ref 30.0–36.0)
MCV: 74.5 fL — ABNORMAL LOW (ref 80.0–100.0)
Platelets: 452 10*3/uL — ABNORMAL HIGH (ref 150–400)
RBC: 3.26 MIL/uL — ABNORMAL LOW (ref 3.87–5.11)
RDW: 16.9 % — ABNORMAL HIGH (ref 11.5–15.5)
WBC: 13.4 10*3/uL — ABNORMAL HIGH (ref 4.0–10.5)
nRBC: 0 % (ref 0.0–0.2)

## 2022-07-30 LAB — PHOSPHORUS: Phosphorus: 4.1 mg/dL (ref 2.5–4.6)

## 2022-07-30 LAB — MAGNESIUM: Magnesium: 1.7 mg/dL (ref 1.7–2.4)

## 2022-07-30 LAB — PREPARE RBC (CROSSMATCH)

## 2022-07-30 MED ORDER — POTASSIUM CHLORIDE 10 MEQ/100ML IV SOLN
10.0000 meq | INTRAVENOUS | Status: AC
  Administered 2022-07-30 (×4): 10 meq via INTRAVENOUS
  Filled 2022-07-30 (×3): qty 100

## 2022-07-30 MED ORDER — DEXTROSE IN LACTATED RINGERS 5 % IV SOLN: INTRAVENOUS | Status: DC

## 2022-07-30 MED ORDER — SODIUM CHLORIDE 0.9% IV SOLUTION: Freq: Once | INTRAVENOUS | Status: DC

## 2022-07-30 MED FILL — Iron Sucrose Inj 20 MG/ML (Fe Equiv): INTRAVENOUS | Qty: 10 | Status: AC

## 2022-07-30 NOTE — Progress Notes (Signed)
[facility] Hospital Day(s): 1.   Post op day(s): 1 Day Post-Op.   Interval History:  Patient seen and examined No acute events or new complaints overnight.  Patient reports she feels pretty good this morning Mild incisional soreness No nausea/emesis Mild leukocytosis to 13.4K Hgb to 7.2 Slight AKI; sCr - 1.07; UO - 185 ccs Mild hypokalemia to 3.2 otherwise no significant electrolyte derangements She is on FLD No output from ostomy  Vital signs in last 24 hours: [min-max] current  Temp:  [96 F (35.6 C)-97.8 F (36.6 C)] 97.7 F (36.5 C) (01/10 0222) Pulse Rate:  [56-98] 72 (01/10 0222) Resp:  [12-24] 16 (01/10 0215) BP: (83-126)/(46-75) 90/56 (01/10 0222) SpO2:  [93 %-100 %] 96 % (01/10 0222) Weight:  [56.2 kg] 56.2 kg (01/09 0944)     Height: 5' (152.4 cm) Weight: 56.2 kg BMI (Calculated): 24.22   Intake/Output last 2 shifts:  01/09 0701 - 01/10 0700 In: 3230 [P.O.:30; I.V.:2100; IV Piggyback:1100] Out: 235 [Urine:185; Blood:50]   Physical Exam:  Constitutional: alert, cooperative and no distress  Respiratory: breathing non-labored at rest  Cardiovascular: regular rate and sinus rhythm  Gastrointestinal: soft, non-tender, and non-distended, no rebound/guarding. End colostomy in left abdomen; pink, no gas or stool yet Genitourinary: Foley in place Integumentary: Laparotomy is CDI with staples and honeycomb  Labs:     Latest Ref Rng & Units 07/30/2022    3:48 AM 07/04/2022   11:31 AM 02/13/2022    2:44 PM  CBC  WBC 4.0 - 10.5 K/uL 13.4  15.7  13.7   Hemoglobin 12.0 - 15.0 g/dL 7.2  9.4  10.7   Hematocrit 36.0 - 46.0 % 24.3  31.5  33.4   Platelets 150 - 400 K/uL 452  562  485       Latest Ref Rng & Units 07/30/2022    3:48 AM 07/04/2022   11:31 AM 07/03/2022    3:41 PM  CMP  Glucose 70 - 99 mg/dL 150  125    BUN 8 - 23 mg/dL 14  10    Creatinine 0.44 - 1.00 mg/dL 1.07  0.72  0.60   Sodium 135 - 145 mmol/L 133  135     Potassium 3.5 - 5.1 mmol/L 3.2  3.5    Chloride 98 - 111 mmol/L 104  97    CO2 22 - 32 mmol/L 19  25    Calcium 8.9 - 10.3 mg/dL 7.9  8.8    Total Protein 6.5 - 8.1 g/dL 5.3  7.5    Total Bilirubin 0.3 - 1.2 mg/dL 0.4  0.4    Alkaline Phos 38 - 126 U/L 38  66    AST 15 - 41 U/L 21  17    ALT 0 - 44 U/L 11  9       Imaging studies: No new pertinent imaging studies   Assessment/Plan:  87 y.o. female 1 Day Post-Op s/p open LAR with end colostomy, abdominal wall resection to include psoas and left transversus abdominus muscles (10x10 cm), and left oophorectomy for advanced descending colon CA.     - Continue FLD for now - Continue IVF support - Will continue foley catheter today given AKI for UP monitoring - Transfuse 1 unit pRBCs; monitor H&H   - Monitor abdominal examination - Engage WOC for ostomy teaching - Pain control prn; Antiemetics prn - Monitor leukocytosis; likely reactive   - Okay to mobilize; engage therapies if needed  All of the above findings and recommendations were discussed with the patient, patient's family at bedside, and the medical team, and all of patient's and family's questions were answered to their expressed satisfaction.  -- Edison Simon, PA-C  [facility] 07/30/2022, 7:32 AM M-F: 7am - 4pm

## 2022-07-30 NOTE — Plan of Care (Signed)

## 2022-07-30 NOTE — Progress Notes (Signed)
Pt was started on 1 unit of RBC. Continuous on IVF. IV K was given. VSS

## 2022-07-30 NOTE — TOC Initial Note (Signed)
Transition of Care [facility]) - Initial/Assessment Note    Patient Details  Name: Kristin Miller MRN: [id number] Date of Birth: [birth year]  [facility]) CM/SW Contact:    Beverly Sessions, RN Phone Number: 07/30/2022, 2:26 PM  Clinical Narrative:                    Admitted for:1 Day Post-Op s/p open LAR with end colostomy  Admitted from: home with Husband PCP: [facility].Patient states that at baseline she drive herself, at discharge husband will be able to transport to appointments  Pharmacy: [facility] Current home health/prior home health/DME: standard walker   Met with patient and spouse Meda Coffee at bedside.  Discussed home health RN for management of new ostomy.  Patient in agreement and states she does not have a preference of home health agency.  Referral made and accepted by [place] with [facility]. Education session with [place] Rn scheduled for tomorrow     Patient Goals and CMS Choice            Expected Discharge Plan and Services                                              Prior Living Arrangements/Services                       Activities of Daily Living Home Assistive Devices/Equipment: None ADL Screening (condition at time of admission) Patient's cognitive ability adequate to safely complete daily activities?: Yes Is the patient deaf or have difficulty hearing?: No Does the patient have difficulty seeing, even when wearing glasses/contacts?: No Does the patient have difficulty concentrating, remembering, or making decisions?: No Patient able to express need for assistance with ADLs?: Yes Does the patient have difficulty dressing or bathing?: No Independently performs ADLs?: Yes (appropriate for developmental age) Does the patient have difficulty walking or climbing stairs?: No Weakness of Legs: None Weakness of Arms/Hands: None  Permission Sought/Granted                  Emotional Assessment              Admission  diagnosis:  S/P partial resection of colon [Z90.49] Patient Active Problem List   Diagnosis Date Noted   S/P partial resection of colon 07/29/2022   Malnutrition ([place]) 07/05/2022   Goals of care, counseling/discussion 07/05/2022   Malignant neoplasm of colon ([place]) 07/04/2022   Iron deficiency anemia due to chronic blood loss 06/27/2022   Intestines neoplasm 06/27/2022   Primary open angle glaucoma (POAG) of both eyes, moderate stage 05/14/2022   Contusion of thigh 08/02/2018   BP (high blood pressure) 11/15/2015   OP (osteoporosis) 11/15/2015   Primary cancer of lower-outer quadrant of right breast ([place]) 06/20/2015   Low back pain 08/02/2014   HTN (hypertension) 08/02/2014   Basal cell carcinoma in situ of skin of left shoulder 07/31/2014   Microcalcifications of the breast 11/28/2013   Abnormal finding on mammography 10/03/2013   Skin cancer 10/03/2013   Mammographic microcalcification 05/04/2013   Chronic bilateral low back pain without sciatica 03/29/2012   PCP:  Lorna Few, DO Pharmacy:   [facility] [address] [name] [address] Phone: [phone] Fax: [phone]     Social Determinants of Health (SDOH) Social  History: SDOH Screenings   Food Insecurity: No Food Insecurity (09/25/2021)  Housing: Low Risk  (09/25/2021)  Transportation Needs: No Transportation Needs (09/25/2021)  Alcohol Screen: Low Risk  (09/25/2021)  Depression (PHQ2-9): Low Risk  (01/30/2022)  Financial Resource Strain: Low Risk  (09/25/2021)  Physical Activity: Inactive (09/25/2021)  Social Connections: Moderately Integrated (09/25/2021)  Stress: No Stress Concern Present (09/25/2021)  Tobacco Use: Low Risk  (07/30/2022)   SDOH Interventions:     Readmission Risk Interventions     No data to display

## 2022-07-30 NOTE — Consult Note (Signed)
[place] Nurse ostomy consult note Stoma type/location: LLQ colostomy, edematous Stomal assessment/size: 2" pale pink and moist  Peristomal assessment: pouch intact  and overlaps surgical, honeycomb dressing.  Will change tomorrow.  Treatment options for stomal/peristomal skin:  will likely need 2 3/4" pouch   Output none yet Ostomy pouching: 2pc.  Education provided: Patient has just had Toradol and is surrounded by spouse, family friend and hairdresser.  Will begin teaching in the AM.  She has agreed to 10 AM Thursday and spouse will be present.  She has a friend that was an emergency medical worker that she may asked to come observe teaching as well.  Written materials left at bedside.   Enrolled patient in [place] program: no Will follow.  Estrellita Ludwig MSN, RN, FNP-BC CWON Wound, Ostomy, Continence Nurse [facility] [phone] Pager [phone]

## 2022-07-31 ENCOUNTER — Inpatient Hospital Stay: Payer: Medicare HMO

## 2022-07-31 LAB — BASIC METABOLIC PANEL
Anion gap: 6 (ref 5–15)
BUN: 15 mg/dL (ref 8–23)
CO2: 21 mmol/L — ABNORMAL LOW (ref 22–32)
Calcium: 7.1 mg/dL — ABNORMAL LOW (ref 8.9–10.3)
Chloride: 102 mmol/L (ref 98–111)
Creatinine, Ser: 1.01 mg/dL — ABNORMAL HIGH (ref 0.44–1.00)
GFR, Estimated: 55 mL/min — ABNORMAL LOW (ref >60)
Glucose, Bld: 142 mg/dL — ABNORMAL HIGH (ref 70–99)
Potassium: 3.3 mmol/L — ABNORMAL LOW (ref 3.5–5.1)
Sodium: 129 mmol/L — ABNORMAL LOW (ref 135–145)

## 2022-07-31 LAB — CBC
HCT: 25.6 % — ABNORMAL LOW (ref 36.0–46.0)
Hemoglobin: 8 g/dL — ABNORMAL LOW (ref 12.0–15.0)
MCH: 23.4 pg — ABNORMAL LOW (ref 26.0–34.0)
MCHC: 31.3 g/dL (ref 30.0–36.0)
MCV: 74.9 fL — ABNORMAL LOW (ref 80.0–100.0)
Platelets: 375 10*3/uL (ref 150–400)
RBC: 3.42 MIL/uL — ABNORMAL LOW (ref 3.87–5.11)
RDW: 16.8 % — ABNORMAL HIGH (ref 11.5–15.5)
WBC: 16.6 10*3/uL — ABNORMAL HIGH (ref 4.0–10.5)
nRBC: 0 % (ref 0.0–0.2)

## 2022-07-31 LAB — MAGNESIUM: Magnesium: 1.5 mg/dL — ABNORMAL LOW (ref 1.7–2.4)

## 2022-07-31 MED ORDER — MAGNESIUM SULFATE 2 GM/50ML IV SOLN
2.0000 g | Freq: Once | INTRAVENOUS | Status: AC
  Administered 2022-07-31: 2 g via INTRAVENOUS
  Filled 2022-07-31: qty 50

## 2022-07-31 MED ORDER — POTASSIUM CHLORIDE 10 MEQ/100ML IV SOLN
10.0000 meq | INTRAVENOUS | Status: AC
  Administered 2022-07-31 (×4): 10 meq via INTRAVENOUS
  Filled 2022-07-31 (×4): qty 100

## 2022-07-31 MED ORDER — CHLORHEXIDINE GLUCONATE CLOTH 2 % EX PADS
6.0000 | MEDICATED_PAD | Freq: Every day | CUTANEOUS | Status: DC
  Administered 2022-07-31: 6 via TOPICAL

## 2022-07-31 MED ORDER — SODIUM CHLORIDE 0.9 % IV SOLN: INTRAVENOUS | Status: DC

## 2022-07-31 NOTE — Progress Notes (Signed)
Foley catheter was removed.

## 2022-07-31 NOTE — Anesthesia Postprocedure Evaluation (Signed)
Anesthesia Post Note  Patient: Kristin Miller  Procedure(s) Performed: PARTIAL COLECTOMY, open left, RNFA to assist  Patient location during evaluation: PACU Anesthesia Type: General Level of consciousness: awake and alert Pain management: pain level controlled Vital Signs Assessment: post-procedure vital signs reviewed and stable Respiratory status: spontaneous breathing, nonlabored ventilation, respiratory function stable and patient connected to nasal cannula oxygen Cardiovascular status: blood pressure returned to baseline and stable Postop Assessment: no apparent nausea or vomiting Anesthetic complications: no   No notable events documented.   Last Vitals:  Vitals:   07/31/22 0412 07/31/22 0841  BP: 131/62 132/75  Pulse: 63 60  Resp: 17 (!) 22  Temp: 36.5 C 36.8 C  SpO2: 99% 100%    Last Pain:  Vitals:   07/31/22 0900  TempSrc:   PainSc: 0-No pain                 Martha Clan

## 2022-07-31 NOTE — Progress Notes (Signed)
[facility] Hospital Day(s): 2.   Post op day(s): 2 Days Post-Op.   Interval History:  Patient seen and examined No acute events or new complaints overnight.  Patient reports she is doing well Abdominal soreness No fever, chills, nausea, emesis Mild leukocytosis to 16.6K Hgb to 8.0; s/p 1 unit pRBCs (1/10) Renal function improved; sCr - 1.01; UO - 350 ccs recorded but bag full at bedside Mild hypokalemia to 3.3; Hypomagnesemia to 1.5 She is on FLD Ostomy now with output; 550 ccs recorded  Vital signs in last 24 hours: [min-max] current  Temp:  [97.6 F (36.4 C)-98.2 F (36.8 C)] 97.7 F (36.5 C) (01/11 0412) Pulse Rate:  [63-85] 63 (01/11 0412) Resp:  [17-20] 17 (01/11 0412) BP: (104-131)/(45-68) 131/62 (01/11 0412) SpO2:  [98 %-100 %] 99 % (01/11 0412)     Height: 5' (152.4 cm) Weight: 56.2 kg BMI (Calculated): 24.22   Intake/Output last 2 shifts:  01/10 0701 - 01/11 0700 In: 876.7 [P.O.:480; Blood:396.7] Out: 900 [Urine:350; Stool:550]   Physical Exam:  Constitutional: alert, cooperative and no distress  Respiratory: breathing non-labored at rest  Cardiovascular: regular rate and sinus rhythm  Gastrointestinal: soft, non-tender, and non-distended, no rebound/guarding. End colostomy in left abdomen; pink, no gas or stool yet Genitourinary: Foley in place Integumentary: Laparotomy is CDI with staples and honeycomb  Labs:     Latest Ref Rng & Units 07/31/2022    3:35 AM 07/30/2022    3:48 AM 07/04/2022   11:31 AM  CBC  WBC 4.0 - 10.5 K/uL 16.6  13.4  15.7   Hemoglobin 12.0 - 15.0 g/dL 8.0  7.2  9.4   Hematocrit 36.0 - 46.0 % 25.6  24.3  31.5   Platelets 150 - 400 K/uL 375  452  562       Latest Ref Rng & Units 07/31/2022    3:35 AM 07/30/2022    3:48 AM 07/04/2022   11:31 AM  CMP  Glucose 70 - 99 mg/dL 142  150  125   BUN 8 - 23 mg/dL '15  14  10   '$ Creatinine 0.44 - 1.00 mg/dL 1.01  1.07  0.72   Sodium 135 - 145 mmol/L  129  133  135   Potassium 3.5 - 5.1 mmol/L 3.3  3.2  3.5   Chloride 98 - 111 mmol/L 102  104  97   CO2 22 - 32 mmol/L '21  19  25   '$ Calcium 8.9 - 10.3 mg/dL 7.1  7.9  8.8   Total Protein 6.5 - 8.1 g/dL  5.3  7.5   Total Bilirubin 0.3 - 1.2 mg/dL  0.4  0.4   Alkaline Phos 38 - 126 U/L  38  66   AST 15 - 41 U/L  21  17   ALT 0 - 44 U/L  11  9      Imaging studies: No new pertinent imaging studies   Assessment/Plan:  87 y.o. female 2 Days Post-Op s/p open LAR with end colostomy, abdominal wall resection to include psoas and left transversus abdominus muscles (10x10 cm), and left oophorectomy for advanced descending colon CA.     - Will advance to soft diet for lunch  - Continue IVF support; switch to NS at 50 ml/hr for AKI - Discontinue foley catheter   - Monitor abdominal examination - Engage WOC for ostomy teaching - Pain control prn; Antiemetics prn - Monitor leukocytosis; likely reactive  - Monitor  H&H; improved appropriately after 1 unit pRBCs yesterday; no signs of bleeding - Replete K+; Replete Mg2+  - Okay to mobilize; engage therapies today  - Discharge Planning: Advancing diet, doing well. Foley out and engage therapies. Will take it slow; anticipate another 14-48 hours   All of the above findings and recommendations were discussed with the patient, patient's family at bedside, and the medical team, and all of patient's and family's questions were answered to their expressed satisfaction.  -- Edison Simon, PA-C [facility] 07/31/2022, 7:38 AM M-F: 7am - 4pm

## 2022-07-31 NOTE — Evaluation (Signed)
Occupational Therapy Evaluation Patient Details Name: STORY CONTI MRN: [id number] DOB: 07-28-1935 Today's Date: 07/31/2022   History of Present Illness Pt is an 87 y/o F admitted on 07/29/22 for partial colectomy. PMH: anemia, back pain, breast CA (outer quadrant of R breast), collar bone fx, HTN, recently diagnosed with descending colon CA   Clinical Impression   Patient presenting with decreased Ind in self care,balance, functional mobility/transfers, endurance, and safety awareness. Patient reports being independent at baseline without use of AD, drives, and lives with husband. Recently pt has been needing assistance from husband secondary to weakness.  Patient currently functioning at min guard - min A for functional mobility. She fatigues very quickly. Pt taking several steps to sink with min HHA and standing to brush teeth but needing to lean onto sink secondary to fatigue. Pt returning to bed and OT began energy conservation education. Recommendation of 3 in 1 and RW for energy conservation and safety for home. Patient will benefit from acute OT to increase overall independence in the areas of ADLs, functional mobility, and safety awareness in order to safely discharge home with family.      Recommendations for follow up therapy are one component of a multi-disciplinary discharge planning process, led by the attending physician.  Recommendations may be updated based on patient status, additional functional criteria and insurance authorization.   Follow Up Recommendations  Home health OT     Assistance Recommended at Discharge Intermittent Supervision/Assistance  Patient can return home with the following A little help with walking and/or transfers;A little help with bathing/dressing/bathroom;Help with stairs or ramp for entrance;Assist for transportation;Assistance with cooking/housework    Functional Status Assessment  Patient has had a recent decline in their functional status and  demonstrates the ability to make significant improvements in function in a reasonable and predictable amount of time.  Equipment Recommendations  BSC/3in1;Other (comment) (RW)       Precautions / Restrictions Precautions Precautions: Fall Precaution Comments: new colostomy      Mobility Bed Mobility Overal bed mobility: Needs Assistance Bed Mobility: Supine to Sit     Supine to sit: Supervision, HOB elevated          Transfers Overall transfer level: Needs assistance Equipment used: None, Rolling walker (2 wheels), 1 person hand held assist Transfers: Sit to/from Stand, Bed to chair/wheelchair/BSC Sit to Stand: Min assist, Min guard     Step pivot transfers: Min assist            Balance Overall balance assessment: Needs assistance Sitting-balance support: Feet supported Sitting balance-Leahy Scale: Good     Standing balance support: During functional activity, No upper extremity supported Standing balance-Leahy Scale: Poor                             ADL either performed or assessed with clinical judgement   ADL Overall ADL's : Needs assistance/impaired     Grooming: Oral care;Standing;Min guard                               Functional mobility during ADLs: Minimal assistance       Vision Patient Visual Report: No change from baseline              Pertinent Vitals/Pain Pain Assessment Pain Assessment: Faces Faces Pain Scale: Hurts a little bit Pain Location: abdomen Pain Descriptors / Indicators: Discomfort Pain Intervention(s): Monitored  during session, Repositioned        Extremity/Trunk Assessment Upper Extremity Assessment Upper Extremity Assessment: Generalized weakness   Lower Extremity Assessment Lower Extremity Assessment: Generalized weakness       Communication Communication Communication: No difficulties   Cognition Arousal/Alertness: Awake/alert Behavior During Therapy: WFL for tasks  assessed/performed Overall Cognitive Status: Within Functional Limits for tasks assessed                                                  Home Living Family/patient expects to be discharged to:: Private residence Living Arrangements: Spouse/significant other Available Help at Discharge: Family Type of Home: House Home Access: Stairs to enter Technical brewer of Steps: 3 Entrance Stairs-Rails: Left Home Layout: Two level;Able to live on main level with bedroom/bathroom     Bathroom Shower/Tub: Walk-in shower         Home Equipment: Chartered certified accountant;Shower seat - built in          Prior Functioning/Environment Prior Level of Function : Independent/Modified Independent;Driving             Mobility Comments: Independent without AD, driving.          OT Problem List: Decreased strength;Decreased activity tolerance;Impaired balance (sitting and/or standing);Decreased knowledge of use of DME or AE;Decreased safety awareness      OT Treatment/Interventions: Self-care/ADL training;Therapeutic exercise;Therapeutic activities;Balance training;DME and/or AE instruction;Patient/family education;Energy conservation    OT Goals(Current goals can be found in the care plan section) Acute Rehab OT Goals Patient Stated Goal: to go home OT Goal Formulation: With patient Time For Goal Achievement: 08/14/22 ADL Goals Pt Will Perform Grooming: with modified independence Pt Will Perform Lower Body Dressing: with modified independence Pt Will Transfer to Toilet: with modified independence Pt Will Perform Toileting - Clothing Manipulation and hygiene: with modified independence  OT Frequency: Min 2X/week       AM-PAC OT "6 Clicks" Daily Activity     Outcome Measure Help from another person eating meals?: None Help from another person taking care of personal grooming?: A Little Help from another person toileting, which includes using toliet, bedpan, or  urinal?: A Little Help from another person bathing (including washing, rinsing, drying)?: A Little Help from another person to put on and taking off regular upper body clothing?: A Little Help from another person to put on and taking off regular lower body clothing?: A Little 6 Click Score: 19   End of Session Nurse Communication: Mobility status  Activity Tolerance: Patient limited by fatigue Patient left: in bed;with call bell/phone within reach;with bed alarm set;with family/visitor present  OT Visit Diagnosis: Unsteadiness on feet (R26.81);Repeated falls (R29.6);Muscle weakness (generalized) (M62.81)                Time: 6433-2951 OT Time Calculation (min): 28 min Charges:  OT General Charges $OT Visit: 1 Visit OT Evaluation $OT Eval Moderate Complexity: 1 Mod OT Treatments $Self Care/Home Management : 8-22 mins  Darleen Crocker, MS, OTR/L , CBIS ascom [phone]  07/31/22, 3:53 PM

## 2022-07-31 NOTE — Consult Note (Signed)
[place] Nurse ostomy consult note Stoma type/location: LLQ colostomy  first pouch change with spouse at bedside observing Stomal assessment/size: well budded 1 3/4" (less edematous than yesterday) pale pink moist and producing blood tinged liquid Peristomal assessment:  There is a dip in the abdominal plane from 1 to 4 o'clock and os empties towards 4 o'clock. We will add barrier ring Treatment options for stomal/peristomal skin: barrier ring and 2 piece 2 3/4" pouch  midline honeycomb dressing removed as ostomy pouch overlapped this.  Staple line is clean, dry and intact Output blood tinged liquid only Ostomy pouching: 2pc. 2 3/4" pouch with barrier ring  Education provided: Pouch change performed by patient with cueing from me. Spouse observes. We remove old pouch, cleanse skin around stoma and apply barrier ring.  I explain rationale for barrier ring, to create a flat pouching surface. We measure stoma and patient cuts barrier to fit. She assembles pouch and barrier together and applies to abdomen with coaching and assistance only due to limited visibility in bed.  WE discuss twice weekly pouch changes, demonstrate emptying and patient able to roll pouch closed.   Enrolled patient in [place] program: Yes will today.  Will follow Estrellita Ludwig MSN, RN, FNP-BC CWON Wound, Ostomy, Continence Nurse [facility] [phone] Pager [phone]

## 2022-07-31 NOTE — Evaluation (Signed)
Physical Therapy Evaluation Patient Details Name: Kristin Miller MRN: [id number] DOB: 19-Mar-1936 Today's Date: 07/31/2022  History of Present Illness  Pt is an 87 y/o F admitted on 07/29/22 for partial colectomy. PMH: anemia, back pain, breast CA (outer quadrant of R breast), collar bone fx, HTN, recently diagnosed with descending colon CA  Clinical Impression  Pt seen for PT evaluation with pt reporting prior to admission she was independent without AD, still driving, residing on the main level of a 2 level home with 3 steps with L rail to enter.  On this date, pt requires supervision for bed mobility with HOB elevated & bed rails with extra time to complete movement. Pt is able to complete STS with min assist & min assist for stand pivot bed>recliner with HHA. Pt endorses feeling unsteady on her feet so provided pt with RW & pt ambulates a few steps in room with min assist before more c/o feeling unwell. Pt with c/o lightheadedness throughout mobility, taking prolonged rest breaks PRN but symptoms never fully resolving. Vitals checked & BP noted to be elevated but nurse in room & aware. Educated pt on recommendation of HHPT f/u, use of RW for increased stability with mobility, and ongoing acute PT services while in hospital.     Recommendations for follow up therapy are one component of a multi-disciplinary discharge planning process, led by the attending physician.  Recommendations may be updated based on patient status, additional functional criteria and insurance authorization.  Follow Up Recommendations Home health PT      Assistance Recommended at Discharge Intermittent Supervision/Assistance  Patient can return home with the following  A little help with walking and/or transfers;A little help with bathing/dressing/bathroom;Assistance with cooking/housework;Assist for transportation;Help with stairs or ramp for entrance    Equipment Recommendations Rolling walker (2 wheels)   Recommendations for Other Services  OT consult    Functional Status Assessment Patient has had a recent decline in their functional status and demonstrates the ability to make significant improvements in function in a reasonable and predictable amount of time.     Precautions / Restrictions Precautions Precautions: Fall Precaution Comments: new colostomy Restrictions Weight Bearing Restrictions: No      Mobility  Bed Mobility Overal bed mobility: Needs Assistance Bed Mobility: Supine to Sit     Supine to sit: Supervision, HOB elevated     General bed mobility comments: extra time to transition to sitting EOB with HOB elevated, use of bed rails.    Transfers Overall transfer level: Needs assistance Equipment used: None, Rolling walker (2 wheels), 1 person hand held assist Transfers: Sit to/from Stand, Bed to chair/wheelchair/BSC Sit to Stand: Min assist, Min guard, Supervision (min assist STS from EOB, fade to CGA<>close supervision STS from recliner with cuing for hand placement to push to standing then place BUE on RW)   Step pivot transfers: Min assist (bed>recliner with 1UE HHA min assist)            Ambulation/Gait Ambulation/Gait assistance: Min assist Gait Distance (Feet): 6 Feet (3 ft forwards + 3 ft backwards) Assistive device: Rolling walker (2 wheels), None Gait Pattern/deviations: Decreased step length - right, Decreased step length - left, Decreased stride length Gait velocity: decreased        Stairs            Wheelchair Mobility    Modified Rankin (Stroke Patients Only)       Balance Overall balance assessment: Needs assistance Sitting-balance support: Feet supported Sitting balance-Leahy Scale:  Good     Standing balance support: During functional activity, No upper extremity supported Standing balance-Leahy Scale: Poor                               Pertinent Vitals/Pain Pain Assessment Pain Assessment:  Faces Faces Pain Scale: Hurts a little bit Pain Location: abdomen Pain Descriptors / Indicators: Discomfort Pain Intervention(s): Monitored during session    Home Living Family/patient expects to be discharged to:: Private residence Living Arrangements: Spouse/significant other Available Help at Discharge: Family Type of Home: House Home Access: Stairs to enter Entrance Stairs-Rails: Left Entrance Stairs-Number of Steps: 3   Home Layout: Two level;Able to live on main level with bedroom/bathroom Home Equipment: Standard Walker      Prior Function Prior Level of Function : Independent/Modified Independent;Driving             Mobility Comments: Independent without AD, driving.       Hand Dominance        Extremity/Trunk Assessment   Upper Extremity Assessment Upper Extremity Assessment: Overall WFL for tasks assessed    Lower Extremity Assessment Lower Extremity Assessment: Generalized weakness    Cervical / Trunk Assessment Cervical / Trunk Assessment: Normal  Communication   Communication: No difficulties  Cognition Arousal/Alertness: Awake/alert Behavior During Therapy: WFL for tasks assessed/performed Overall Cognitive Status: Within Functional Limits for tasks assessed                                          General Comments General comments (skin integrity, edema, etc.): Vitals at end of session sitting in recliner: HR 80 bpm, SpO2 99% on room air, BP in LUE 133/105 mmHg MAP 112 -- nurse in room & aware    Exercises     Assessment/Plan    PT Assessment Patient needs continued PT services  PT Problem List Decreased strength;Decreased mobility;Decreased safety awareness;Decreased activity tolerance;Cardiopulmonary status limiting activity;Decreased balance;Decreased knowledge of use of DME;Pain       PT Treatment Interventions DME instruction;Therapeutic activities;Modalities;Therapeutic exercise;Gait training;Patient/family  education;Stair training;Balance training;Functional mobility training;Neuromuscular re-education;Manual techniques    PT Goals (Current goals can be found in the Care Plan section)  Acute Rehab PT Goals Patient Stated Goal: get better PT Goal Formulation: With patient Time For Goal Achievement: 08/14/22 Potential to Achieve Goals: Good    Frequency Min 2X/week     Co-evaluation               AM-PAC PT "6 Clicks" Mobility  Outcome Measure Help needed turning from your back to your side while in a flat bed without using bedrails?: A Little Help needed moving from lying on your back to sitting on the side of a flat bed without using bedrails?: A Little Help needed moving to and from a bed to a chair (including a wheelchair)?: A Little Help needed standing up from a chair using your arms (e.g., wheelchair or bedside chair)?: A Little Help needed to walk in hospital room?: A Little Help needed climbing 3-5 steps with a railing? : A Lot 6 Click Score: 17    End of Session   Activity Tolerance: Treatment limited secondary to medical complications (Comment) (limited by feeling unwell, dizzy) Patient left: in chair;with nursing/sitter in room;with call bell/phone within reach;with family/visitor present Nurse Communication: Mobility status PT Visit Diagnosis: Muscle weakness (generalized) (  M62.81);Unsteadiness on feet (R26.81);Difficulty in walking, not elsewhere classified (R26.2)    Time: 1308-6578 PT Time Calculation (min) (ACUTE ONLY): 20 min   Charges:   PT Evaluation $PT Eval Moderate Complexity: 1 Mod PT Treatments $Therapeutic Activity: 8-22 mins        Lavone Nian, PT, DPT 07/31/22, 11:03 AM   Waunita Schooner 07/31/2022, 11:01 AM

## 2022-07-31 NOTE — Plan of Care (Signed)

## 2022-08-01 LAB — MAGNESIUM: Magnesium: 1.9 mg/dL (ref 1.7–2.4)

## 2022-08-01 LAB — BPAM RBC
Blood Product Expiration Date: 202401122359
Blood Product Expiration Date: 202402142359
Blood Product Expiration Date: 202402142359
ISSUE DATE / TIME: 202401101803
ISSUE DATE / TIME: 202401102025
Unit Type and Rh: 5100
Unit Type and Rh: 5100
Unit Type and Rh: 9500

## 2022-08-01 LAB — TYPE AND SCREEN
ABO/RH(D): O NEG
Antibody Screen: NEGATIVE
Unit division: 0
Unit division: 0
Unit division: 0

## 2022-08-01 LAB — CBC
HCT: 28.6 % — ABNORMAL LOW (ref 36.0–46.0)
Hemoglobin: 9 g/dL — ABNORMAL LOW (ref 12.0–15.0)
MCH: 23.7 pg — ABNORMAL LOW (ref 26.0–34.0)
MCHC: 31.5 g/dL (ref 30.0–36.0)
MCV: 75.3 fL — ABNORMAL LOW (ref 80.0–100.0)
Platelets: 451 10*3/uL — ABNORMAL HIGH (ref 150–400)
RBC: 3.8 MIL/uL — ABNORMAL LOW (ref 3.87–5.11)
RDW: 17.8 % — ABNORMAL HIGH (ref 11.5–15.5)
WBC: 21.9 10*3/uL — ABNORMAL HIGH (ref 4.0–10.5)
nRBC: 0 % (ref 0.0–0.2)

## 2022-08-01 LAB — BASIC METABOLIC PANEL
Anion gap: 5 (ref 5–15)
BUN: 13 mg/dL (ref 8–23)
CO2: 19 mmol/L — ABNORMAL LOW (ref 22–32)
Calcium: 7.6 mg/dL — ABNORMAL LOW (ref 8.9–10.3)
Chloride: 107 mmol/L (ref 98–111)
Creatinine, Ser: 0.79 mg/dL (ref 0.44–1.00)
GFR, Estimated: >60 mL/min (ref >60)
Glucose, Bld: 104 mg/dL — ABNORMAL HIGH (ref 70–99)
Potassium: 3.7 mmol/L (ref 3.5–5.1)
Sodium: 131 mmol/L — ABNORMAL LOW (ref 135–145)

## 2022-08-01 LAB — PREPARE RBC (CROSSMATCH)

## 2022-08-01 MED ORDER — OXYCODONE HCL 5 MG PO TABS: 5.0000 mg | ORAL_TABLET | Freq: Four times a day (QID) | ORAL | 0 refills | Status: DC | PRN

## 2022-08-01 MED ORDER — METHOCARBAMOL 500 MG PO TABS: 500.0000 mg | ORAL_TABLET | Freq: Three times a day (TID) | ORAL | 0 refills | Status: DC | PRN

## 2022-08-01 MED ORDER — POTASSIUM CHLORIDE CRYS ER 20 MEQ PO TBCR: 20.0000 meq | EXTENDED_RELEASE_TABLET | Freq: Two times a day (BID) | ORAL | 0 refills | Status: DC

## 2022-08-01 MED ORDER — ONDANSETRON 4 MG PO TBDP: 4.0000 mg | ORAL_TABLET | Freq: Four times a day (QID) | ORAL | 0 refills | Status: DC | PRN

## 2022-08-01 MED ORDER — IBUPROFEN 600 MG PO TABS: 600.0000 mg | ORAL_TABLET | Freq: Four times a day (QID) | ORAL | 0 refills | Status: DC | PRN

## 2022-08-01 MED FILL — Iron Sucrose Inj 20 MG/ML (Fe Equiv): INTRAVENOUS | Qty: 10 | Status: AC

## 2022-08-01 NOTE — Discharge Summary (Addendum)
[facility] SURGICAL DISCHARGE SUMMARY  Patient ID: Kristin Miller MRN: [id number] DOB/AGE: September 17, 1935 87 y.o.  Admit date: 07/29/2022 Discharge date: 08/01/2022  Discharge Diagnoses Patient Active Problem List   Diagnosis Date Noted   S/P partial resection of colon 07/29/2022   Malnutrition ([place]) 07/05/2022   Goals of care, counseling/discussion 07/05/2022   Malignant neoplasm of colon ([place]) 07/04/2022    Consultants None  Procedures 07/29/2022 Open Low anterior resection with end colostomy Abdominal wall resection to include psoas and left transversus abdominus muscles measuring 10x10 cm segement Left Oophorectomy  HPI: Kristin Miller is a 87 y.o. female with history of advanced descending colon CA who presents to [facility] on 01/09 for scheduled resection.   Hospital Course: Informed consent was obtained and documented, and patient underwent uneventful open LAR with end colostomy, abdominal wall resection to include psoas and left transversus abdominus muscles (10x10 cm), and left oophorectomy  (Dr Dahlia Byes, 07/29/2022).  Post-operatively, patient actually did very well given the circumstances. She did get 1 unit pRBCs on POD1 prophylactically. She did have leukocytosis post-op but no evidence of infection nor other complication. Advancement of patient's diet and ambulation with therapies was well-tolerated. The remainder of patient's hospital course was essentially unremarkable, and discharge planning was initiated accordingly with patient safely able to be discharged home with appropriate discharge instructions, pain control, and outpatient follow-up after all of her questions were answered to her expressed satisfaction.   Discharge Condition: Good   Physical Examination:  Constitutional: alert, cooperative and no distress  Respiratory: breathing non-labored at rest  Cardiovascular: regular rate and sinus rhythm  Gastrointestinal: soft, non-tender, and  non-distended, no rebound/guarding. End colostomy in left abdomen; pink, gas and liquid stool in bag  Integumentary: Laparotomy is CDI with staples; no erythema   Allergies as of 08/01/2022       Reactions   Combigan [brimonidine Tartrate-timolol]    Redness, burning, irritation   Lisinopril Cough   Other Rash   SUTURES-(PARTIAL HYSTERECTOMY)        Medication List     STOP taking these medications    metroNIDAZOLE 500 MG tablet Commonly known as: FLAGYL   neomycin 500 MG tablet Commonly known as: MYCIFRADIN   polyethylene glycol powder 17 GM/SCOOP powder Commonly known as: MiraLax       TAKE these medications    acetaminophen 500 MG tablet Commonly known as: TYLENOL Take 500 mg by mouth every 6 (six) hours as needed for pain.   ibuprofen 600 MG tablet Commonly known as: ADVIL Take 1 tablet (600 mg total) by mouth every 6 (six) hours as needed.   latanoprost 0.005 % ophthalmic solution Commonly known as: XALATAN 1 drop at bedtime. Eye doctor   losartan-hydrochlorothiazide 100-12.5 MG tablet Commonly known as: HYZAAR Take 1 tablet by mouth daily.   methocarbamol 500 MG tablet Commonly known as: ROBAXIN Take 1 tablet (500 mg total) by mouth every 8 (eight) hours as needed for muscle spasms.   ondansetron 4 MG disintegrating tablet Commonly known as: ZOFRAN-ODT Take 1 tablet (4 mg total) by mouth every 6 (six) hours as needed for nausea.   oxyCODONE 5 MG immediate release tablet Commonly known as: Oxy IR/ROXICODONE Take 1 tablet (5 mg total) by mouth every 6 (six) hours as needed for severe pain or breakthrough pain.   potassium chloride SA 20 MEQ tablet Commonly known as: KLOR-CON M Take 1 tablet (20 mEq total) by mouth 2 (two) times daily for 14 days.   promethazine-dextromethorphan  6.25-15 MG/5ML syrup Commonly known as: PROMETHAZINE-DM Take 2.5 mLs by mouth 4 (four) times daily as needed.               Durable Medical Equipment  (From  admission, onward)           Start     Ordered   07/31/22 1105  For home use only DME Walker rolling  Once       Question Answer Comment  Walker: With [place]   Patient needs a walker to treat with the following condition General weakness      07/31/22 1104              Follow-up Information     Tylene Fantasia, PA-C Follow up in 10 day(s).   Specialty: Physician Assistant Why: s/p coloectomy; has staples Contact information: [address] [place] [address] [phone]         Earlie Server, MD. Schedule an appointment as soon as possible for a visit in 2 week(s).   Specialty: Oncology Why: descending colon CA s/p resection Contact information: [place] [address] [phone]         [facility]. Schedule an appointment as soon as possible for a visit in 2 week(s).   Specialty: General Surgery Why: New end colostomy Contact information: [address] [id number] [place] [phone]                 Time spent on discharge management including discussion of hospital course, clinical condition, outpatient instructions, prescriptions, and follow up with the patient and members of the medical team: >30 minutes  -- Edison Simon , PA-C [facility]  08/01/2022, 8:41 AM [phone] M-F: 7am - 4pm

## 2022-08-01 NOTE — Plan of Care (Signed)
  Problem: Education: Goal: Knowledge of General Education information will improve Description: Including pain rating scale, medication(s)/side effects and non-pharmacologic comfort measures 08/01/2022 1036 by Nile Dear, RN Outcome: Adequate for Discharge 08/01/2022 1035 by Nile Dear, RN Outcome: Adequate for Discharge   Problem: Health Behavior/Discharge Planning: Goal: Ability to manage health-related needs will improve 08/01/2022 1036 by Nile Dear, RN Outcome: Adequate for Discharge 08/01/2022 1035 by Nile Dear, RN Outcome: Adequate for Discharge   Problem: Clinical Measurements: Goal: Ability to maintain clinical measurements within normal limits will improve 08/01/2022 1036 by Nile Dear, RN Outcome: Adequate for Discharge 08/01/2022 1035 by Nile Dear, RN Outcome: Adequate for Discharge Goal: Will remain free from infection 08/01/2022 1036 by Nile Dear, RN Outcome: Adequate for Discharge 08/01/2022 1035 by Nile Dear, RN Outcome: Adequate for Discharge Goal: Diagnostic test results will improve 08/01/2022 1036 by Nile Dear, RN Outcome: Adequate for Discharge 08/01/2022 1035 by Nile Dear, RN Outcome: Adequate for Discharge Goal: Respiratory complications will improve 08/01/2022 1036 by Nile Dear, RN Outcome: Adequate for Discharge 08/01/2022 1035 by Nile Dear, RN Outcome: Adequate for Discharge Goal: Cardiovascular complication will be avoided 08/01/2022 1036 by Nile Dear, RN Outcome: Adequate for Discharge 08/01/2022 1035 by Nile Dear, RN Outcome: Adequate for Discharge   Problem: Activity: Goal: Risk for activity intolerance will decrease 08/01/2022 1036 by Nile Dear, RN Outcome: Adequate for Discharge 08/01/2022 1035 by Nile Dear, RN Outcome: Adequate for Discharge   Problem: Nutrition: Goal: Adequate nutrition will be maintained 08/01/2022 1036 by  Nile Dear, RN Outcome: Adequate for Discharge 08/01/2022 1035 by Nile Dear, RN Outcome: Adequate for Discharge   Problem: Coping: Goal: Level of anxiety will decrease 08/01/2022 1036 by Nile Dear, RN Outcome: Adequate for Discharge 08/01/2022 1035 by Nile Dear, RN Outcome: Adequate for Discharge   Problem: Elimination: Goal: Will not experience complications related to bowel motility 08/01/2022 1036 by Nile Dear, RN Outcome: Adequate for Discharge 08/01/2022 1035 by Nile Dear, RN Outcome: Adequate for Discharge Goal: Will not experience complications related to urinary retention 08/01/2022 1036 by Nile Dear, RN Outcome: Adequate for Discharge 08/01/2022 1035 by Nile Dear, RN Outcome: Adequate for Discharge   Problem: Pain Managment: Goal: General experience of comfort will improve 08/01/2022 1036 by Nile Dear, RN Outcome: Adequate for Discharge 08/01/2022 1035 by Nile Dear, RN Outcome: Adequate for Discharge   Problem: Safety: Goal: Ability to remain free from injury will improve 08/01/2022 1036 by Nile Dear, RN Outcome: Adequate for Discharge 08/01/2022 1035 by Nile Dear, RN Outcome: Adequate for Discharge   Problem: Skin Integrity: Goal: Risk for impaired skin integrity will decrease 08/01/2022 1036 by Nile Dear, RN Outcome: Adequate for Discharge 08/01/2022 1035 by Nile Dear, RN Outcome: Adequate for Discharge

## 2022-08-01 NOTE — Discharge Instructions (Addendum)
In addition to included general post-operative instructions,  Diet: Resume home diet.   Activity: No heavy lifting >20 pounds (children, pets, laundry, garbage) or strenuous activity for 6 weeks, but light activity and walking are encouraged. Do not drive or drink alcohol if taking narcotic pain medications or having pain that might distract from driving. Okay to continue to work with therapies   Wound care: You may shower/get incision wet with soapy water and pat dry (do not rub incisions), but no baths or submerging incision underwater until follow-up. We will remove staples ni 7-10 days at follow up appointment   Medications: Resume all home medications. For mild to moderate pain: acetaminophen (Tylenol) or ibuprofen/naproxen (if no kidney disease). Combining Tylenol with alcohol can substantially increase your risk of causing liver disease. Narcotic pain medications, if prescribed, can be used for severe pain, though may cause nausea, constipation, and drowsiness. Do not combine Tylenol and Percocet (or similar) within a 6 hour period as Percocet (and similar) contain(s) Tylenol. If you do not need the narcotic pain medication, you do not need to fill the prescription.  Call office [phone] / [phone]) at any time if any questions, worsening pain, fevers/chills, bleeding, drainage from incision site, or other concerns.

## 2022-08-01 NOTE — Progress Notes (Signed)
Discharge instructions were reviewed with pt and her husband. Questions were answered and encourage. Ostomy supplies were given to pt.  Pt denies pain at this time

## 2022-08-01 NOTE — TOC Transition Note (Signed)
Transition of Care [facility]) - CM/SW Discharge Note   Patient Details  Name: Kristin Miller MRN: [id number] Date of Birth: 1935-10-02  [facility] CM/SW Contact:  Magnus Ivan, LCSW Phone Number: 08/01/2022, 9:21 AM   Clinical Narrative:    Patient has orders to DC home today. CSW notified [place] with [facility]. HH orders are in.    Final next level of care: [place] Barriers to Discharge: Barriers Resolved   Patient Goals and CMS Choice      Discharge Placement                         Discharge Plan and Services Additional resources added to the After Visit Summary for                            [facility] Arranged: RN [facility] Agency: [place] Date [place]: 08/01/22   Representative spoke with at [place]: [place]  Social Determinants of Health ([place]) Interventions SDOH Screenings   Food Insecurity: No Food Insecurity (07/30/2022)  Housing: Low Risk  (07/30/2022)  Transportation Needs: No Transportation Needs (07/30/2022)  Utilities: Not At Risk (07/30/2022)  Alcohol Screen: Low Risk  (09/25/2021)  Depression (PHQ2-9): Low Risk  (01/30/2022)  Financial Resource Strain: Low Risk  (09/25/2021)  Physical Activity: Inactive (09/25/2021)  Social Connections: Moderately Integrated (09/25/2021)  Stress: No Stress Concern Present (09/25/2021)  Tobacco Use: Low Risk  (07/30/2022)     Readmission Risk Interventions     No data to display

## 2022-08-02 ENCOUNTER — Other Ambulatory Visit: Payer: Self-pay

## 2022-08-02 ENCOUNTER — Inpatient Hospital Stay
Admission: EM | Admit: 2022-08-02 | Discharge: 2022-08-05 | DRG: 393 | Disposition: A | Discharge status: Home / Self Care | Payer: Medicare HMO | Attending: Internal Medicine | Admitting: Internal Medicine

## 2022-08-02 ENCOUNTER — Emergency Department: Payer: Medicare HMO

## 2022-08-02 DIAGNOSIS — H401132 Primary open-angle glaucoma, bilateral, moderate stage: Secondary | ICD-10-CM | POA: Diagnosis present

## 2022-08-02 DIAGNOSIS — K209 Esophagitis, unspecified without bleeding: Secondary | ICD-10-CM

## 2022-08-02 DIAGNOSIS — Z85828 Personal history of other malignant neoplasm of skin: Secondary | ICD-10-CM

## 2022-08-02 DIAGNOSIS — Z803 Family history of malignant neoplasm of breast: Secondary | ICD-10-CM

## 2022-08-02 DIAGNOSIS — D509 Iron deficiency anemia, unspecified: Secondary | ICD-10-CM | POA: Diagnosis present

## 2022-08-02 DIAGNOSIS — E871 Hypo-osmolality and hyponatremia: Secondary | ICD-10-CM | POA: Diagnosis present

## 2022-08-02 DIAGNOSIS — Z90710 Acquired absence of both cervix and uterus: Secondary | ICD-10-CM

## 2022-08-02 DIAGNOSIS — I10 Essential (primary) hypertension: Secondary | ICD-10-CM

## 2022-08-02 DIAGNOSIS — K2101 Gastro-esophageal reflux disease with esophagitis, with bleeding: Secondary | ICD-10-CM | POA: Diagnosis present

## 2022-08-02 DIAGNOSIS — Z66 Do not resuscitate: Secondary | ICD-10-CM | POA: Diagnosis present

## 2022-08-02 DIAGNOSIS — K9189 Other postprocedural complications and disorders of digestive system: Principal | ICD-10-CM | POA: Diagnosis present

## 2022-08-02 DIAGNOSIS — Z933 Colostomy status: Secondary | ICD-10-CM

## 2022-08-02 DIAGNOSIS — K922 Gastrointestinal hemorrhage, unspecified: Secondary | ICD-10-CM | POA: Diagnosis not present

## 2022-08-02 DIAGNOSIS — D72829 Elevated white blood cell count, unspecified: Secondary | ICD-10-CM | POA: Diagnosis present

## 2022-08-02 DIAGNOSIS — Z90721 Acquired absence of ovaries, unilateral: Secondary | ICD-10-CM

## 2022-08-02 DIAGNOSIS — K625 Hemorrhage of anus and rectum: Secondary | ICD-10-CM

## 2022-08-02 DIAGNOSIS — Z961 Presence of intraocular lens: Secondary | ICD-10-CM | POA: Diagnosis present

## 2022-08-02 DIAGNOSIS — Z9049 Acquired absence of other specified parts of digestive tract: Secondary | ICD-10-CM

## 2022-08-02 DIAGNOSIS — Y838 Other surgical procedures as the cause of abnormal reaction of the patient, or of later complication, without mention of misadventure at the time of the procedure: Secondary | ICD-10-CM | POA: Diagnosis present

## 2022-08-02 DIAGNOSIS — K56600 Partial intestinal obstruction, unspecified as to cause: Secondary | ICD-10-CM | POA: Diagnosis present

## 2022-08-02 DIAGNOSIS — M1990 Unspecified osteoarthritis, unspecified site: Secondary | ICD-10-CM | POA: Diagnosis present

## 2022-08-02 DIAGNOSIS — M810 Age-related osteoporosis without current pathological fracture: Secondary | ICD-10-CM | POA: Diagnosis present

## 2022-08-02 DIAGNOSIS — Z91048 Other nonmedicinal substance allergy status: Secondary | ICD-10-CM

## 2022-08-02 DIAGNOSIS — Z79899 Other long term (current) drug therapy: Secondary | ICD-10-CM

## 2022-08-02 DIAGNOSIS — C189 Malignant neoplasm of colon, unspecified: Secondary | ICD-10-CM | POA: Diagnosis present

## 2022-08-02 DIAGNOSIS — K56609 Unspecified intestinal obstruction, unspecified as to partial versus complete obstruction: Principal | ICD-10-CM

## 2022-08-02 DIAGNOSIS — K449 Diaphragmatic hernia 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 gangrene: Secondary | ICD-10-CM | POA: Diagnosis present

## 2022-08-02 DIAGNOSIS — K2100 Gastro-esophageal reflux disease with esophagitis, without bleeding: Secondary | ICD-10-CM | POA: Insufficient documentation

## 2022-08-02 DIAGNOSIS — Z888 Allergy status to other drugs, medicaments and biological substances status: Secondary | ICD-10-CM

## 2022-08-02 MED ORDER — SODIUM CHLORIDE 0.9 % IV SOLN: INTRAVENOUS | Status: DC

## 2022-08-02 MED ORDER — FENTANYL CITRATE PF 50 MCG/ML IJ SOSY
50.0000 ug | PREFILLED_SYRINGE | Freq: Once | INTRAMUSCULAR | Status: AC
  Administered 2022-08-02: 50 ug via INTRAVENOUS
  Filled 2022-08-02: qty 1

## 2022-08-02 MED ORDER — ONDANSETRON HCL 4 MG/2ML IJ SOLN
4.0000 mg | Freq: Once | INTRAMUSCULAR | Status: AC
  Administered 2022-08-02: 4 mg via INTRAVENOUS
  Filled 2022-08-02: qty 2

## 2022-08-02 MED ORDER — IOHEXOL 350 MG/ML SOLN
100.0000 mL | Freq: Once | INTRAVENOUS | Status: AC | PRN
  Administered 2022-08-03: 100 mL via INTRAVENOUS

## 2022-08-02 NOTE — ED Provider Notes (Signed)
[facility] Provider Note    Event Date/Time   First MD Initiated Contact with Patient 08/02/22 2301     (approximate)   History   Abdominal Pain   HPI  Kristin Miller is a 87 y.o. female with history of hypertension, breast cancer, recent diagnosis of colon cancer who underwent partial colectomy with Dr. Dahlia Byes on 07/29/2022 who was discharged yesterday who presents to the emergency department today with her family with concerns for upper abdominal pain that and bleeding from her rectum.  She states she has seen bright red blood in the past a small amount of stool from her rectum.  She has had normal output from her ostomy without any blood or melena.  She states he last emptied her ostomy bag at 4:30 p.m. and she has not had any output since.  States she is not on any blood thinners.   History provided by patient and husband.    Past Medical History:  Diagnosis Date   Allergy    Anemia    H/O AS A CHILD   Arthritis    Back pain    Breast cancer of upper-outer quadrant of right female breast ([place]) 06/12/2015   4 millimeter, T1a, N0; ER 90%, PR 0%, HER-2/neu not overexpressed.  Declined radiation therapy.   Collar bone fracture 05/28/2013   Fractured Collar bone on left   Dyspnea    Dysrhythmia    h/o heart skipping beat years ago   Frequent headaches    h/o migraines   Hypertension    Osteopenia 04/2018   Bone density without significant interval change since 2017.   Pneumonia     Past Surgical History:  Procedure Laterality Date   ABDOMINAL HYSTERECTOMY  1970   Partial   BREAST BIOPSY Right 05/28/2015   INVASIVE MAMMARY CARCINOMA    BREAST BIOPSY Right 06/12/2015   Wide excision, sentinel lymph node biopsy.   BREAST LUMPECTOMY WITH AXILLARY LYMPH NODE DISSECTION Right 06/12/2015   4 mm, T1a,N0 withDCIS with clear margins. ER: 90%, PR 0%; Her 2 neu not overexpressed.    CATARACT EXTRACTION W/PHACO Left 02/03/2017   Procedure: CATARACT  EXTRACTION PHACO AND INTRAOCULAR LENS PLACEMENT (IOC);  Surgeon: Birder Robson, MD;  Location: [facility];  Service: Ophthalmology;  Laterality: Left;  [place]  00:28 AP% 16.3 CDE 4.68 Fluid pack lot # [id number]   CATARACT EXTRACTION W/PHACO Right 03/01/2019   Procedure: CATARACT EXTRACTION PHACO AND INTRAOCULAR LENS PLACEMENT ([place])  RIGHT;  Surgeon: Birder Robson, MD;  Location: [place];  Service: Ophthalmology;  Laterality: Right;   COLONOSCOPY     COLONOSCOPY WITH PROPOFOL N/A 06/27/2022   Procedure: COLONOSCOPY WITH PROPOFOL;  Surgeon: [name], MD;  Location: [place];  Service: Endoscopy;  Laterality: N/A;   ESOPHAGOGASTRODUODENOSCOPY (EGD) WITH PROPOFOL N/A 06/27/2022   Procedure: ESOPHAGOGASTRODUODENOSCOPY (EGD) WITH PROPOFOL;  Surgeon: [name], MD;  Location: [place];  Service: Endoscopy;  Laterality: N/A;   PARTIAL COLECTOMY N/A 07/29/2022   Procedure: PARTIAL COLECTOMY, open left, RNFA to assist;  Surgeon: Jules Husbands, MD;  Location: [facility];  Service: General;  Laterality: N/A;    MEDICATIONS:  Prior to Admission medications   Medication Sig Start Date End Date Taking? Authorizing Provider  acetaminophen (TYLENOL) 500 MG tablet Take 500 mg by mouth every 6 (six) hours as needed for pain.    [provider]  ibuprofen (ADVIL) 600 MG tablet Take 1 tablet (600 mg total) by mouth every  6 (six) hours as needed. 08/01/22   Tylene Fantasia, PA-C  latanoprost (XALATAN) 0.005 % ophthalmic solution 1 drop at bedtime. Eye doctor 12/18/20   [provider]  losartan-hydrochlorothiazide (HYZAAR) 100-12.5 MG tablet Take 1 tablet by mouth daily. 01/30/22   Juline Patch, MD  methocarbamol (ROBAXIN) 500 MG tablet Take 1 tablet (500 mg total) by mouth every 8 (eight) hours as needed for muscle spasms. 08/01/22   Tylene Fantasia, PA-C  ondansetron (ZOFRAN-ODT) 4 MG disintegrating tablet Take 1 tablet (4 mg total) by mouth every 6 (six)  hours as needed for nausea. 08/01/22   Tylene Fantasia, PA-C  oxyCODONE (OXY IR/ROXICODONE) 5 MG immediate release tablet Take 1 tablet (5 mg total) by mouth every 6 (six) hours as needed for severe pain or breakthrough pain. 08/01/22   Tylene Fantasia, PA-C  potassium chloride SA (KLOR-CON M) 20 MEQ tablet Take 1 tablet (20 mEq total) by mouth 2 (two) times daily for 14 days. 08/01/22 08/15/22  Tylene Fantasia, PA-C  promethazine-dextromethorphan (PROMETHAZINE-DM) 6.25-15 MG/5ML syrup Take 2.5 mLs by mouth 4 (four) times daily as needed. 07/19/22   Lyndee Hensen, DO    Physical Exam   Triage Vital Signs: ED Triage Vitals  Enc Vitals Group     BP 08/02/22 2236 98/60     Pulse Rate 08/02/22 2236 (!) 105     Resp 08/02/22 2236 17     Temp 08/02/22 2236 97.9 F (36.6 C)     Temp Source 08/02/22 2236 Oral     SpO2 08/02/22 2235 100 %     Weight 08/02/22 2237 125 lb (56.7 kg)     Height 08/02/22 2237 5' (1.524 m)     Head Circumference --      Peak Flow --      Pain Score 08/02/22 2237 0     Pain Loc --      Pain Edu? --      Excl. in [place]? --     Most recent vital signs: Vitals:   08/02/22 2236 08/03/22 0210  BP: 98/60 124/62  Pulse: (!) 105 (!) 105  Resp: 17 20  Temp: 97.9 F (36.6 C) 98.1 F (36.7 C)  SpO2: 100% 100%    CONSTITUTIONAL: Alert and oriented and responds appropriately to questions.  Elderly, appears slightly uncomfortable HEAD: Normocephalic, atraumatic EYES: Conjunctivae clear, pupils appear equal, sclera nonicteric ENT: normal nose; moist mucous membranes NECK: Supple, normal ROM CARD: Regular and minimally tachycardic; S1 and S2 appreciated; no murmurs, no clicks, no rubs, no gallops RESP: Normal chest excursion without splinting or tachypnea; breath sounds clear and equal bilaterally; no wheezes, no rhonchi, no rales, no hypoxia or respiratory distress, speaking full sentences ABD/GI: non-distended; soft, tender throughout the abdomen.  Incision site is  clean, dry and intact.  Staples in place.  No surrounding redness or warmth.  No output from the ostomy.  Ostomy appears normal. RECTAL:  Normal rectal tone, + gross red blood that is guaiac positive.  No melena.  No hemorrhoids appreciated, nontender rectal exam.  No stool present on rectal exam. BACK: The back appears normal EXT: Normal ROM in all joints; no deformity noted, no edema; no cyanosis SKIN: Normal color for age and race; warm; no rash on exposed skin NEURO: Moves all extremities equally, normal speech PSYCH: The patient's mood and manner are appropriate.   ED Results / Procedures / Treatments   LABS: (all labs ordered are listed, but only abnormal  results are displayed) Labs Reviewed  CBC WITH DIFFERENTIAL/PLATELET - Abnormal; Notable for the following components:      Result Value   WBC 21.3 (*)    Hemoglobin 9.5 (*)    HCT 30.6 (*)    MCV 76.9 (*)    MCH 23.9 (*)    RDW 20.2 (*)    Platelets 617 (*)    Neutro Abs 18.0 (*)    Abs Immature Granulocytes 0.79 (*)    All other components within normal limits  COMPREHENSIVE METABOLIC PANEL - Abnormal; Notable for the following components:   Sodium 134 (*)    Glucose, Bld 116 (*)    Calcium 8.1 (*)    Total Protein 5.6 (*)    Albumin 2.3 (*)    All other components within normal limits  LIPASE, BLOOD - Abnormal; Notable for the following components:   Lipase 55 (*)    All other components within normal limits  PROTIME-INR  BASIC METABOLIC PANEL  CBC  HEMOGLOBIN AND HEMATOCRIT, BLOOD  HEMOGLOBIN AND HEMATOCRIT, BLOOD  HEMOGLOBIN AND HEMATOCRIT, BLOOD  TYPE AND SCREEN  TROPONIN I (HIGH SENSITIVITY)  TROPONIN I (HIGH SENSITIVITY)     EKG:    Date: 08/03/2022 2:08 AM  Rate: 103  Rhythm: Sinus tachycardia  QRS Axis: normal  Intervals: normal  ST/T Wave abnormalities: normal  Conduction Disutrbances: none  Narrative Interpretation: Sinus tachycardia     RADIOLOGY: My personal review and  interpretation of imaging: CT shows no active bleeding.  There are signs of ileus versus bowel obstruction.  I have personally reviewed all radiology reports.   CT ANGIO GI BLEED  Result Date: 08/03/2022 CLINICAL DATA:  Lower GI bleed, rectal bleeding. Status post colectomy with ostomy. EXAM: CTA ABDOMEN AND PELVIS WITHOUT AND WITH CONTRAST TECHNIQUE: Multidetector CT imaging of the abdomen and pelvis was performed using the standard protocol during bolus administration of intravenous contrast. Multiplanar reconstructed images and MIPs were obtained and reviewed to evaluate the vascular anatomy. RADIATION DOSE REDUCTION: This exam was performed according to the departmental dose-optimization program which includes automated exposure control, adjustment of the mA and/or kV according to patient size and/or use of iterative reconstruction technique. CONTRAST:  135m OMNIPAQUE IOHEXOL 350 MG/ML SOLN COMPARISON:  07/03/2022 FINDINGS: VASCULAR Aorta: Atherosclerotic calcifications and irregularity. No aneurysm or dissection. Celiac: Mild-to-moderate narrowing proximally, patent. No aneurysm or dissection. SMA: Widely patent Renals: Widely patent IMA: Patent. Inflow: Atherosclerotic calcifications.  No aneurysm or dissection. Proximal Outflow: No aneurysm or dissection. Veins: No obvious venous abnormality within the limitations of this arterial phase study. No focal hepatic abnormality. Gallbladder unremarkable. Review of the MIP images confirms the above findings. NON-VASCULAR Lower chest: Trace bilateral effusions. Bibasilar atelectasis. Moderate to large hiatal hernia. Distal esophagus appears thick walled suggesting esophagitis. Fluid noted within the distal esophagus which could be related to reflux or dysmotility. Hepatobiliary: No focal hepatic abnormality. Gallbladder unremarkable. Pancreas: No focal abnormality or ductal dilatation. Spleen: No focal abnormality.  Normal size. Adrenals/Urinary Tract: No  adrenal abnormality. No focal renal abnormality. No stones or hydronephrosis. Urinary bladder is unremarkable. Stomach/Bowel: Changes of partial colectomy with Hartmann's pouch and left lower quadrant ostomy. No active extravasation of contrast. Stomach is moderately distended with fluid as is the duodenum and proximal jejunum. Gradual decreased caliber of small bowel. Mid to distal small bowel are decompressed. Lymphatic: No adenopathy Reproductive: Prior hysterectomy.  No adnexal masses. Other: Moderate free air in the abdomen and pelvis, presumably from recent surgery. Gas also seen  within the subcutaneous soft tissues in the anterior abdominal wall. Trace free fluid in the cul-de-sac. Musculoskeletal: No acute bony abnormality. Severe chronic compression fracture at T12 with vertebral plana. IMPRESSION: VASCULAR No active contrast extravasation to suggest or localize active GI bleed. Aortic atherosclerosis. NON-VASCULAR Postoperative changes from left colectomy. Moderate free air in the abdomen and pelvis likely related to recent postoperative state. Mildly distended stomach and proximal small bowel with gradual decompression. This favors ileus although a degree of small-bowel obstruction cannot be excluded. Fluid-filled distal esophagus with moderate-sized hiatal hernia. Distal esophagus appears thick walled suggesting esophagitis. Electronically Signed   By: Rolm Baptise M.D.   On: 08/03/2022 00:37     PROCEDURES:  Critical Care performed: No      .1-3 Lead EKG Interpretation  Performed by: Jaramie Bastos, Delice Bison, DO Authorized by: Atanacio Melnyk, Delice Bison, DO     Interpretation: abnormal     ECG rate:  105   ECG rate assessment: tachycardic     Rhythm: sinus tachycardia     Ectopy: none     Conduction: normal       IMPRESSION / MDM / ASSESSMENT AND PLAN / ED COURSE  I reviewed the triage vital signs and the nursing notes.    Patient here with rectal bleeding.  She is postop day 4 from partial  colectomy.  The patient is on the cardiac monitor to evaluate for evidence of arrhythmia and/or significant heart rate changes.   DIFFERENTIAL DIAGNOSIS (includes but not limited to):   Anal fissure, internal hemorrhoid, postoperative bleeding, colitis, diverticulitis, diverticular bleed   Patient's presentation is most consistent with acute presentation with potential threat to life or bodily function.   PLAN: Will obtain CBC, CMP, lipase, troponin x 2 given upper abdominal pain, EKG, CTA of the abdomen, type and screen, INR.  Will give IV fluids, pain and nausea medicine.   MEDICATIONS GIVEN IN ED: Medications  0.9 %  sodium chloride infusion ( Intravenous New Bag/Given 08/03/22 0007)  oxyCODONE (Oxy IR/ROXICODONE) immediate release tablet 5 mg (has no administration in time range)  losartan-hydrochlorothiazide (HYZAAR) 100-12.5 MG per tablet 1 tablet (has no administration in time range)  ondansetron (ZOFRAN-ODT) disintegrating tablet 4 mg (has no administration in time range)  methocarbamol (ROBAXIN) tablet 500 mg (has no administration in time range)  potassium chloride SA (KLOR-CON M) CR tablet 20 mEq (has no administration in time range)  promethazine-dextromethorphan (PROMETHAZINE-DM) 6.25-15 MG/5ML syrup 2.5 mL (has no administration in time range)  latanoprost (XALATAN) 0.005 % ophthalmic solution 1 drop (has no administration in time range)  0.9 % NaCl with KCl 20 mEq/ L  infusion (has no administration in time range)  acetaminophen (TYLENOL) tablet 650 mg (has no administration in time range)    Or  acetaminophen (TYLENOL) suppository 650 mg (has no administration in time range)  traZODone (DESYREL) tablet 25 mg (has no administration in time range)  ondansetron (ZOFRAN) tablet 4 mg (has no administration in time range)    Or  ondansetron (ZOFRAN) injection 4 mg (has no administration in time range)  fentaNYL (SUBLIMAZE) injection 50 mcg (50 mcg Intravenous Given 08/02/22  2330)  ondansetron (ZOFRAN) injection 4 mg (4 mg Intravenous Given 08/02/22 2331)  iohexol (OMNIPAQUE) 350 MG/ML injection 100 mL (100 mLs Intravenous Contrast Given 08/03/22 0007)  sodium chloride 0.9 % bolus 500 mL (0 mLs Intravenous Stopped 08/03/22 0210)  pantoprazole (PROTONIX) injection 40 mg (40 mg Intravenous Given 08/03/22 0105)  fentaNYL (SUBLIMAZE) injection 50 mcg (50  mcg Intravenous Given 08/03/22 0203)  ondansetron [facility]) injection 4 mg (4 mg Intravenous Given 08/03/22 0203)     ED COURSE: Blood pressure and heart rate improving with hydration.  Patient has a leukocytosis of 21,000 but this was present upon discharge.  No fevers.  Hemoglobin is still low but uptrending from recent surgery and is now 9.5.  Normal electrolytes, renal function.  LFTs normal.  Lipase only minimally elevated.  Normal coags.  Troponin negative.  She still has not had any output from her colostomy.  CT scan reviewed and interpreted by myself and the radiologist and shows no signs of active bleeding but she has what appears to be an ileus versus bowel obstruction.  I favor that this is a bowel obstruction especially given increasing pain and nausea and no output from her ostomy.  Will place NG tube.  Discussed this with Dr. Christian Mate with general surgery who agrees and asked for medicine admission.  Will discuss with the hospitalist.  CT also shows esophagitis.  Will give Protonix.   CONSULTS:  Consulted and discussed patient's case with hospitalist, Dr. [place] Ace.  I have recommended admission and consulting physician agrees and will place admission orders.  Patient (and family if present) agree with this plan.   I reviewed all nursing notes, vitals, pertinent previous records.  All labs, EKGs, imaging ordered have been independently reviewed and interpreted by myself.    OUTSIDE RECORDS REVIEWED: Reviewed patient's operative note on 07/29/2022.       FINAL CLINICAL IMPRESSION(S) / ED DIAGNOSES   Final  diagnoses:  Rectal bleeding  SBO (small bowel obstruction) ([place])     Rx / DC Orders   ED Discharge Orders     None        Note:  This document was prepared using Dragon voice recognition software and may include unintentional dictation errors.   Zedekiah Hinderman, Delice Bison, DO 08/03/22 [phone]

## 2022-08-02 NOTE — ED Triage Notes (Signed)
Pt to ED from home via [place]. Pt discharged from [facility] following partial colectomy and colostomy placement x5days. Pt noticed clots in toilet today after emptying her ostomy bag. VS enroute were BP 154/72, P 110, SPO2 98%, Temp 97.9.

## 2022-08-03 ENCOUNTER — Emergency Department: Payer: Medicare HMO

## 2022-08-03 ENCOUNTER — Inpatient Hospital Stay: Payer: Medicare HMO

## 2022-08-03 ENCOUNTER — Other Ambulatory Visit: Payer: Self-pay

## 2022-08-03 DIAGNOSIS — K209 Esophagitis, unspecified without bleeding: Secondary | ICD-10-CM | POA: Diagnosis not present

## 2022-08-03 DIAGNOSIS — K9189 Other postprocedural complications and disorders of digestive system: Secondary | ICD-10-CM | POA: Diagnosis present

## 2022-08-03 DIAGNOSIS — Z90710 Acquired absence of both cervix and uterus: Secondary | ICD-10-CM | POA: Diagnosis not present

## 2022-08-03 DIAGNOSIS — Z803 Family history of malignant neoplasm of breast: Secondary | ICD-10-CM | POA: Diagnosis not present

## 2022-08-03 DIAGNOSIS — M810 Age-related osteoporosis without current pathological fracture: Secondary | ICD-10-CM | POA: Diagnosis present

## 2022-08-03 DIAGNOSIS — Z91048 Other nonmedicinal substance allergy status: Secondary | ICD-10-CM | POA: Diagnosis not present

## 2022-08-03 DIAGNOSIS — Z933 Colostomy status: Secondary | ICD-10-CM | POA: Diagnosis not present

## 2022-08-03 DIAGNOSIS — Y838 Other surgical procedures as the cause of abnormal reaction of the patient, or of later complication, without mention of misadventure at the time of the procedure: Secondary | ICD-10-CM | POA: Diagnosis present

## 2022-08-03 DIAGNOSIS — K56609 Unspecified intestinal obstruction, unspecified as to partial versus complete obstruction: Secondary | ICD-10-CM | POA: Diagnosis not present

## 2022-08-03 DIAGNOSIS — Z85828 Personal history of other malignant neoplasm of skin: Secondary | ICD-10-CM | POA: Diagnosis not present

## 2022-08-03 DIAGNOSIS — Z9049 Acquired absence of other specified parts of digestive tract: Secondary | ICD-10-CM | POA: Diagnosis not present

## 2022-08-03 DIAGNOSIS — Z66 Do not resuscitate: Secondary | ICD-10-CM | POA: Diagnosis present

## 2022-08-03 DIAGNOSIS — K922 Gastrointestinal hemorrhage, unspecified: Secondary | ICD-10-CM | POA: Diagnosis present

## 2022-08-03 DIAGNOSIS — Z888 Allergy status to other drugs, medicaments and biological substances status: Secondary | ICD-10-CM | POA: Diagnosis not present

## 2022-08-03 DIAGNOSIS — H401132 Primary open-angle glaucoma, bilateral, moderate stage: Secondary | ICD-10-CM | POA: Diagnosis present

## 2022-08-03 DIAGNOSIS — Z79899 Other long term (current) drug therapy: Secondary | ICD-10-CM | POA: Diagnosis not present

## 2022-08-03 DIAGNOSIS — K2100 Gastro-esophageal reflux disease with esophagitis, without bleeding: Secondary | ICD-10-CM | POA: Diagnosis not present

## 2022-08-03 DIAGNOSIS — D509 Iron deficiency anemia, unspecified: Secondary | ICD-10-CM | POA: Diagnosis present

## 2022-08-03 DIAGNOSIS — K2101 Gastro-esophageal reflux disease with esophagitis, with bleeding: Secondary | ICD-10-CM | POA: Diagnosis present

## 2022-08-03 DIAGNOSIS — K449 Diaphragmatic hernia 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 gangrene: Secondary | ICD-10-CM | POA: Diagnosis present

## 2022-08-03 DIAGNOSIS — K56600 Partial intestinal obstruction, unspecified as to cause: Secondary | ICD-10-CM | POA: Diagnosis present

## 2022-08-03 DIAGNOSIS — I10 Essential (primary) hypertension: Secondary | ICD-10-CM | POA: Diagnosis present

## 2022-08-03 DIAGNOSIS — K625 Hemorrhage of anus and rectum: Secondary | ICD-10-CM | POA: Diagnosis not present

## 2022-08-03 DIAGNOSIS — Z961 Presence of intraocular lens: Secondary | ICD-10-CM | POA: Diagnosis present

## 2022-08-03 DIAGNOSIS — E871 Hypo-osmolality and hyponatremia: Secondary | ICD-10-CM | POA: Diagnosis present

## 2022-08-03 DIAGNOSIS — K2971 Gastritis, unspecified, with bleeding: Secondary | ICD-10-CM

## 2022-08-03 DIAGNOSIS — D72829 Elevated white blood cell count, unspecified: Secondary | ICD-10-CM | POA: Diagnosis present

## 2022-08-03 DIAGNOSIS — C189 Malignant neoplasm of colon, unspecified: Secondary | ICD-10-CM | POA: Diagnosis present

## 2022-08-03 DIAGNOSIS — Z90721 Acquired absence of ovaries, unilateral: Secondary | ICD-10-CM | POA: Diagnosis not present

## 2022-08-03 DIAGNOSIS — M1990 Unspecified osteoarthritis, unspecified site: Secondary | ICD-10-CM | POA: Diagnosis present

## 2022-08-03 LAB — COMPREHENSIVE METABOLIC PANEL
ALT: 18 U/L (ref 0–44)
AST: 27 U/L (ref 15–41)
Albumin: 2.3 g/dL — ABNORMAL LOW (ref 3.5–5.0)
Alkaline Phosphatase: 77 U/L (ref 38–126)
Anion gap: 8 (ref 5–15)
BUN: 15 mg/dL (ref 8–23)
CO2: 22 mmol/L (ref 22–32)
Calcium: 8.1 mg/dL — ABNORMAL LOW (ref 8.9–10.3)
Chloride: 104 mmol/L (ref 98–111)
Creatinine, Ser: 0.58 mg/dL (ref 0.44–1.00)
GFR, Estimated: >60 mL/min (ref >60)
Glucose, Bld: 116 mg/dL — ABNORMAL HIGH (ref 70–99)
Potassium: 3.6 mmol/L (ref 3.5–5.1)
Sodium: 134 mmol/L — ABNORMAL LOW (ref 135–145)
Total Bilirubin: 0.7 mg/dL (ref 0.3–1.2)
Total Protein: 5.6 g/dL — ABNORMAL LOW (ref 6.5–8.1)

## 2022-08-03 LAB — CBC WITH DIFFERENTIAL/PLATELET
Abs Immature Granulocytes: 0.79 10*3/uL — ABNORMAL HIGH (ref 0.00–0.07)
Basophils Absolute: 0.1 10*3/uL (ref 0.0–0.1)
Basophils Relative: 1 %
Eosinophils Absolute: 0.1 10*3/uL (ref 0.0–0.5)
Eosinophils Relative: 1 %
HCT: 30.6 % — ABNORMAL LOW (ref 36.0–46.0)
Hemoglobin: 9.5 g/dL — ABNORMAL LOW (ref 12.0–15.0)
Immature Granulocytes: 4 %
Lymphocytes Relative: 6 %
Lymphs Abs: 1.2 10*3/uL (ref 0.7–4.0)
MCH: 23.9 pg — ABNORMAL LOW (ref 26.0–34.0)
MCHC: 31 g/dL (ref 30.0–36.0)
MCV: 76.9 fL — ABNORMAL LOW (ref 80.0–100.0)
Monocytes Absolute: 1 10*3/uL (ref 0.1–1.0)
Monocytes Relative: 5 %
Neutro Abs: 18 10*3/uL — ABNORMAL HIGH (ref 1.7–7.7)
Neutrophils Relative %: 83 %
Platelets: 617 10*3/uL — ABNORMAL HIGH (ref 150–400)
RBC: 3.98 MIL/uL (ref 3.87–5.11)
RDW: 20.2 % — ABNORMAL HIGH (ref 11.5–15.5)
WBC: 21.3 10*3/uL — ABNORMAL HIGH (ref 4.0–10.5)
nRBC: 0 % (ref 0.0–0.2)

## 2022-08-03 LAB — CBC
HCT: 31.8 % — ABNORMAL LOW (ref 36.0–46.0)
Hemoglobin: 9.8 g/dL — ABNORMAL LOW (ref 12.0–15.0)
MCH: 23.8 pg — ABNORMAL LOW (ref 26.0–34.0)
MCHC: 30.8 g/dL (ref 30.0–36.0)
MCV: 77.2 fL — ABNORMAL LOW (ref 80.0–100.0)
Platelets: 636 10*3/uL — ABNORMAL HIGH (ref 150–400)
RBC: 4.12 MIL/uL (ref 3.87–5.11)
RDW: 20.9 % — ABNORMAL HIGH (ref 11.5–15.5)
WBC: 21.1 10*3/uL — ABNORMAL HIGH (ref 4.0–10.5)
nRBC: 0 % (ref 0.0–0.2)

## 2022-08-03 LAB — BASIC METABOLIC PANEL
Anion gap: 10 (ref 5–15)
BUN: 11 mg/dL (ref 8–23)
CO2: 20 mmol/L — ABNORMAL LOW (ref 22–32)
Calcium: 7.8 mg/dL — ABNORMAL LOW (ref 8.9–10.3)
Chloride: 104 mmol/L (ref 98–111)
Creatinine, Ser: 0.58 mg/dL (ref 0.44–1.00)
GFR, Estimated: >60 mL/min (ref >60)
Glucose, Bld: 124 mg/dL — ABNORMAL HIGH (ref 70–99)
Potassium: 3.6 mmol/L (ref 3.5–5.1)
Sodium: 134 mmol/L — ABNORMAL LOW (ref 135–145)

## 2022-08-03 LAB — TROPONIN I (HIGH SENSITIVITY)
Troponin I (High Sensitivity): 6 ng/L (ref <18)
Troponin I (High Sensitivity): 8 ng/L (ref <18)

## 2022-08-03 LAB — PROTIME-INR
INR: 1.1 (ref 0.8–1.2)
Prothrombin Time: 14.3 seconds (ref 11.4–15.2)

## 2022-08-03 LAB — HEMOGLOBIN AND HEMATOCRIT, BLOOD
HCT: 30.7 % — ABNORMAL LOW (ref 36.0–46.0)
HCT: 28.4 % — ABNORMAL LOW (ref 36.0–46.0)
HCT: 26.8 % — ABNORMAL LOW (ref 36.0–46.0)
Hemoglobin: 9.5 g/dL — ABNORMAL LOW (ref 12.0–15.0)
Hemoglobin: 8.8 g/dL — ABNORMAL LOW (ref 12.0–15.0)
Hemoglobin: 8.3 g/dL — ABNORMAL LOW (ref 12.0–15.0)

## 2022-08-03 LAB — TYPE AND SCREEN
ABO/RH(D): O NEG
Antibody Screen: NEGATIVE

## 2022-08-03 LAB — LIPASE, BLOOD: Lipase: 55 U/L — ABNORMAL HIGH (ref 11–51)

## 2022-08-03 MED ORDER — ONDANSETRON HCL 4 MG/2ML IJ SOLN
4.0000 mg | Freq: Four times a day (QID) | INTRAMUSCULAR | Status: DC | PRN
  Administered 2022-08-03: 4 mg via INTRAVENOUS
  Filled 2022-08-03: qty 2

## 2022-08-03 MED ORDER — LATANOPROST 0.005 % OP SOLN
1.0000 [drp] | Freq: Every day | OPHTHALMIC | Status: DC
  Administered 2022-08-03 – 2022-08-04 (×2): 1 [drp] via OPHTHALMIC

## 2022-08-03 MED ORDER — ONDANSETRON 4 MG PO TBDP: 4.0000 mg | ORAL_TABLET | Freq: Four times a day (QID) | ORAL | Status: DC | PRN

## 2022-08-03 MED ORDER — FENTANYL CITRATE PF 50 MCG/ML IJ SOSY
50.0000 ug | PREFILLED_SYRINGE | Freq: Once | INTRAMUSCULAR | Status: AC
  Administered 2022-08-03: 50 ug via INTRAVENOUS
  Filled 2022-08-03: qty 1

## 2022-08-03 MED ORDER — PANTOPRAZOLE SODIUM 40 MG IV SOLR
40.0000 mg | Freq: Two times a day (BID) | INTRAVENOUS | Status: DC
  Administered 2022-08-03 – 2022-08-04 (×4): 40 mg via INTRAVENOUS
  Filled 2022-08-03 (×4): qty 10

## 2022-08-03 MED ORDER — PANTOPRAZOLE SODIUM 40 MG IV SOLR
40.0000 mg | Freq: Once | INTRAVENOUS | Status: AC
  Administered 2022-08-03: 40 mg via INTRAVENOUS
  Filled 2022-08-03: qty 10

## 2022-08-03 MED ORDER — POTASSIUM CHLORIDE CRYS ER 20 MEQ PO TBCR
20.0000 meq | EXTENDED_RELEASE_TABLET | Freq: Two times a day (BID) | ORAL | Status: DC
  Filled 2022-08-03: qty 1

## 2022-08-03 MED ORDER — LOSARTAN POTASSIUM-HCTZ 100-12.5 MG PO TABS: 1.0000 | ORAL_TABLET | Freq: Every day | ORAL | Status: DC

## 2022-08-03 MED ORDER — POTASSIUM CHLORIDE IN NACL 20-0.9 MEQ/L-% IV SOLN
INTRAVENOUS | Status: DC
  Filled 2022-08-03: qty 1000

## 2022-08-03 MED ORDER — TRAZODONE HCL 50 MG PO TABS: 25.0000 mg | ORAL_TABLET | Freq: Every evening | ORAL | Status: DC | PRN

## 2022-08-03 MED ORDER — SODIUM CHLORIDE 0.9 % IV BOLUS (SEPSIS)
500.0000 mL | Freq: Once | INTRAVENOUS | Status: AC
  Administered 2022-08-03: 500 mL via INTRAVENOUS

## 2022-08-03 MED ORDER — ONDANSETRON HCL 4 MG PO TABS
4.0000 mg | ORAL_TABLET | Freq: Four times a day (QID) | ORAL | Status: DC | PRN
  Filled 2022-08-03: qty 1

## 2022-08-03 MED ORDER — PROMETHAZINE-DM 6.25-15 MG/5ML PO SYRP: 2.5000 mL | ORAL_SOLUTION | Freq: Four times a day (QID) | ORAL | Status: DC | PRN

## 2022-08-03 MED ORDER — LOSARTAN POTASSIUM 50 MG PO TABS
100.0000 mg | ORAL_TABLET | Freq: Every day | ORAL | Status: DC
  Administered 2022-08-04: 100 mg via ORAL
  Filled 2022-08-03 (×2): qty 2

## 2022-08-03 MED ORDER — ACETAMINOPHEN 325 MG PO TABS: 650.0000 mg | ORAL_TABLET | Freq: Four times a day (QID) | ORAL | Status: DC | PRN

## 2022-08-03 MED ORDER — ONDANSETRON HCL 4 MG/2ML IJ SOLN
4.0000 mg | Freq: Once | INTRAMUSCULAR | Status: AC
  Administered 2022-08-03: 4 mg via INTRAVENOUS
  Filled 2022-08-03: qty 2

## 2022-08-03 MED ORDER — METHOCARBAMOL 500 MG PO TABS: 500.0000 mg | ORAL_TABLET | Freq: Three times a day (TID) | ORAL | Status: DC | PRN

## 2022-08-03 MED ORDER — ACETAMINOPHEN 650 MG RE SUPP: 650.0000 mg | Freq: Four times a day (QID) | RECTAL | Status: DC | PRN

## 2022-08-03 MED ORDER — OXYCODONE HCL 5 MG PO TABS: 5.0000 mg | ORAL_TABLET | Freq: Four times a day (QID) | ORAL | Status: DC | PRN

## 2022-08-03 MED ORDER — HYDROCHLOROTHIAZIDE 12.5 MG PO TABS
12.5000 mg | ORAL_TABLET | Freq: Every day | ORAL | Status: DC
  Administered 2022-08-04: 12.5 mg via ORAL
  Filled 2022-08-03 (×2): qty 1

## 2022-08-03 NOTE — Assessment & Plan Note (Signed)
-  Will keep her n.p.o. - She will be hydrated with IV normal saline. - We will optimize electrolytes. - General surgery consult to be obtained as mentioned above.

## 2022-08-03 NOTE — Progress Notes (Signed)
[facility] NAME: Kristin Miller    MR#:  [id number]  DATE OF BIRTH:  05/04/1936  SUBJECTIVE:  husband at bedside in the ER. Patient has NG tube placed about 250 mL bilious return. Denies any abdominal pain. She overall feels a bit better than yesterday. Brown stools. Colostomy output minimal since patient has not eaten anything. Denies any abdominal pain. No more blood clots noted per rectum.    VITALS:  Blood pressure 122/61, pulse (!) 104, temperature 98.5 F (36.9 C), temperature source Oral, resp. rate (!) 24, height 5' (1.524 m), weight 56.7 kg, SpO2 99 %.  PHYSICAL EXAMINATION:   GENERAL:  87 y.o.-year-old patient lying in the bed with no acute distress.  LUNGS: Normal breath sounds bilaterally, no wheezing CARDIOVASCULAR: S1, S2 normal. No murmur   ABDOMEN: Soft, nontender, nondistended. Colostomy + EXTREMITIES: No  edema b/l.    NEUROLOGIC: nonfocal  patient is alert and awake  LABORATORY PANEL:  CBC Recent Labs  Lab 08/03/22 0540 08/03/22 0843  WBC 21.1*  --   HGB 9.8* 8.8*  HCT 31.8* 28.4*  PLT 636*  --     Chemistries  Recent Labs  Lab 08/01/22 0340 08/02/22 2339 08/03/22 0540  NA 131* 134* 134*  K 3.7 3.6 3.6  CL 107 104 104  CO2 19* 22 20*  GLUCOSE 104* 116* 124*  BUN '13 15 11  '$ CREATININE 0.79 0.58 0.58  CALCIUM 7.6* 8.1* 7.8*  MG 1.9  --   --   AST  --  27  --   ALT  --  18  --   ALKPHOS  --  77  --   BILITOT  --  0.7  --    Cardiac Enzymes No results for input(s): "TROPONINI" in the last 168 hours. RADIOLOGY:  DG Abdomen 1 View  Result Date: 08/03/2022 CLINICAL DATA:  87 year old female status post nasogastric tube placement. EXAM: ABDOMEN - 1 VIEW COMPARISON:  No priors. FINDINGS: Nasogastric tube in position, with tip projecting over the lower left hemithorax. The tube appears coiled, with tip likely in the stomach, however, this appears to be within the portion of the stomach that is  intrathoracic (the patient had a moderate to large hiatal hernia on prior CT examination). Visualized bowel-gas pattern is unremarkable. Skin staples are evident in the midline of the mid abdomen. IMPRESSION: 1. Poorly positioned nasogastric tube which appears coiled in the proximal stomach, likely with tip in a hiatal hernia. Advancement of the nasogastric tube is suggested for more optimal positioning. Electronically Signed   By: Vinnie Langton M.D.   On: 08/03/2022 06:19   CT ANGIO GI BLEED  Result Date: 08/03/2022 CLINICAL DATA:  Lower GI bleed, rectal bleeding. Status post colectomy with ostomy. EXAM: CTA ABDOMEN AND PELVIS WITHOUT AND WITH CONTRAST TECHNIQUE: Multidetector CT imaging of the abdomen and pelvis was performed using the standard protocol during bolus administration of intravenous contrast. Multiplanar reconstructed images and MIPs were obtained and reviewed to evaluate the vascular anatomy. RADIATION DOSE REDUCTION: This exam was performed according to the departmental dose-optimization program which includes automated exposure control, adjustment of the mA and/or kV according to patient size and/or use of iterative reconstruction technique. CONTRAST:  118m OMNIPAQUE IOHEXOL 350 MG/ML SOLN COMPARISON:  07/03/2022 FINDINGS: VASCULAR Aorta: Atherosclerotic calcifications and irregularity. No aneurysm or dissection. Celiac: Mild-to-moderate narrowing proximally, patent. No aneurysm or dissection. SMA: Widely patent Renals: Widely patent IMA: Patent. Inflow: Atherosclerotic calcifications.  No aneurysm or dissection. Proximal Outflow: No aneurysm or dissection. Veins: No obvious venous abnormality within the limitations of this arterial phase study. No focal hepatic abnormality. Gallbladder unremarkable. Review of the MIP images confirms the above findings. NON-VASCULAR Lower chest: Trace bilateral effusions. Bibasilar atelectasis. Moderate to large hiatal hernia. Distal esophagus appears thick  walled suggesting esophagitis. Fluid noted within the distal esophagus which could be related to reflux or dysmotility. Hepatobiliary: No focal hepatic abnormality. Gallbladder unremarkable. Pancreas: No focal abnormality or ductal dilatation. Spleen: No focal abnormality.  Normal size. Adrenals/Urinary Tract: No adrenal abnormality. No focal renal abnormality. No stones or hydronephrosis. Urinary bladder is unremarkable. Stomach/Bowel: Changes of partial colectomy with Hartmann's pouch and left lower quadrant ostomy. No active extravasation of contrast. Stomach is moderately distended with fluid as is the duodenum and proximal jejunum. Gradual decreased caliber of small bowel. Mid to distal small bowel are decompressed. Lymphatic: No adenopathy Reproductive: Prior hysterectomy.  No adnexal masses. Other: Moderate free air in the abdomen and pelvis, presumably from recent surgery. Gas also seen within the subcutaneous soft tissues in the anterior abdominal wall. Trace free fluid in the cul-de-sac. Musculoskeletal: No acute bony abnormality. Severe chronic compression fracture at T12 with vertebral plana. IMPRESSION: VASCULAR No active contrast extravasation to suggest or localize active GI bleed. Aortic atherosclerosis. NON-VASCULAR Postoperative changes from left colectomy. Moderate free air in the abdomen and pelvis likely related to recent postoperative state. Mildly distended stomach and proximal small bowel with gradual decompression. This favors ileus although a degree of small-bowel obstruction cannot be excluded. Fluid-filled distal esophagus with moderate-sized hiatal hernia. Distal esophagus appears thick walled suggesting esophagitis. Electronically Signed   By: Rolm Baptise M.D.   On: 08/03/2022 00:37    Assessment and Plan Kristin Miller is a 87 y.o. Caucasian female with medical history significant for essential hypertension, osteoarthritis, breast cancer status post lumpectomy and axillary lymph  node dissection, as well as colon cancer status post partial colectomy on 07/29/2021 by Dr. Dahlia Byes, who presented to the emergency room with acute onset of upper abdominal pain as well as rectal bleeding.  She has been having normal output from her colostomy without any blood or melena.    CT angiography for GI bleeding revealed the following: Postoperative changes from left colectomy. Moderate free air in the abdomen and pelvis likely related to recent postoperative state.  Mildly distended stomach and proximal small bowel with gradual decompression. This favors ileus although a degree of small-bowel obstruction cannot be excluded.  Fluid-filled distal esophagus with moderate-sized hiatal hernia. Distal esophagus appears thick walled suggesting esophagitis.   GI bleeding -  patient is status post partial colectomy for colon cancer. --?some old blood from surgery --hgb stable. Cont to monitor -  Dr. Christian Mate to see pt   Partial SBO (small bowel obstruction) (HCC) Reflux esophagitis and HH on CT abd - IV normal saline. - D/w Dr Justice Britain to clamp NG and statr ice chips--CLD --IV PPI BID --hold po home meds for now   Essential hypertension - IV prn hydralazine  Adenocarcinoma Colon --s/p Open Low anterior resection with end colostomy on Jan 9th 2024 Abdominal wall resection to include psoas and left transversus abdominus muscles measuring 10x10 cm segement Left Oophorectomy --pt follows with dr Tasia Catchings  Procedures:none Family communication : husband at bedside Consults : general surgery CODE STATUS: DNR per pt DVT Prophylaxis :SCD Level of care: Telemetry Medical Status is: Inpatient Remains inpatient appropriate because: SBO    TOTAL TIME TAKING  CARE OF THIS PATIENT: 35 minutes.  >50% time spent on counselling and coordination of care  Note: This dictation was prepared with Dragon dictation along with smaller phrase technology. Any transcriptional errors that result from  this process are unintentional.  Fritzi Mandes M.D    [facility]   CC: Primary care physician; Lorna Few, DO

## 2022-08-03 NOTE — Assessment & Plan Note (Signed)
-  We will continue her antihypertensives. 

## 2022-08-03 NOTE — H&P (Signed)
[facility]   PATIENT NAME: Kristin Miller    MR#:  [id number]  DATE OF BIRTH:  [birth year]  DATE OF ADMISSION:  08/02/2022  PRIMARY CARE PHYSICIAN: Kristin Few, DO   Patient is coming from: Home  REQUESTING/REFERRING PHYSICIAN: Ward, Kristin Bison, DO  CHIEF COMPLAINT:   Chief Complaint  Patient presents with   Abdominal Pain    HISTORY OF PRESENT ILLNESS:  Kristin Miller is a 87 y.o. Caucasian female with medical history significant for essential hypertension, osteoarthritis, breast cancer status post lumpectomy and axillary lymph node dissection, as well as colon cancer status post partial colectomy on 07/29/2021 by Kristin Miller, who presented to the emergency room with acute onset of upper abdominal pain as well as rectal bleeding.  She is seen bright red bleeding with small amount of rectal stools.  She has been having normal output from her colostomy without any blood or melena.  She last emptied her colostomy bag however at 4:30 PM and has not had any output since then.  She denies any fever or chills.  No nausea or vomiting.  No chest pain or palpitations or cough or wheezing.  No dysuria, oliguria or hematuria or flank pain.  No other bleeding diathesis.  ED Course: When she came to the ER, blood pressure was 98/60 and heart rate 105 and later respiratory rate was up to 26.  Labs revealed hyponatremia 134 with otherwise unremarkable BEM PE.  LFTs showed total protein of 5.6 and albumin 2.3 and lipase 55.  High sensitive troponin I was 6 and later 8.  CBC showed leukocytosis of 21.3 with neutrophilia and hemoglobin is 9.5 and hematocrit 30.6 compared to 9/20.6 on 1/12 and platelets were 617 compared to 451 then.  PT was 14.3 and INR 1.1.  Blood group was also negative with negative antibody screen. EKG as reviewed by me : EKG showed sinus tachycardia with a rate of 103 with low voltage QRS and PACs. Imaging: CT angiography for GI bleeding revealed the following: Postoperative  changes from left colectomy. Moderate free air in the abdomen and pelvis likely related to recent postoperative state.   Mildly distended stomach and proximal small bowel with gradual decompression. This favors ileus although a degree of small-bowel obstruction cannot be excluded.   Fluid-filled distal esophagus with moderate-sized hiatal hernia. Distal esophagus appears thick walled suggesting esophagitis.  The patient was given 4 mg IV Zofran twice, 40 mg of IV Protonix, 50 mcg of IV fentanyl twice and 500 mill IV normal saline followed by 125 mill per hour.  She will be admitted to a medical telemetry bed for further evaluation and management. PAST MEDICAL HISTORY:   Past Medical History:  Diagnosis Date   Allergy    Anemia    H/O AS A CHILD   Arthritis    Back pain    Breast cancer of upper-outer quadrant of right female breast ([facility]) 06/12/2015   4 millimeter, T1a, N0; ER 90%, PR 0%, HER-2/neu not overexpressed.  Declined radiation therapy.   Collar bone fracture 05/28/2013   Fractured Collar bone on left   Dyspnea    Dysrhythmia    h/o heart skipping beat years ago   Frequent headaches    h/o migraines   Hypertension    Osteopenia 04/2018   Bone density without significant interval change since 2017.   Pneumonia     PAST SURGICAL HISTORY:   Past Surgical History:  Procedure Laterality Date   ABDOMINAL  HYSTERECTOMY  1970   Partial   BREAST BIOPSY Right 05/28/2015   INVASIVE MAMMARY CARCINOMA    BREAST BIOPSY Right 06/12/2015   Wide excision, sentinel lymph node biopsy.   BREAST LUMPECTOMY WITH AXILLARY LYMPH NODE DISSECTION Right 06/12/2015   4 mm, T1a,N0 withDCIS with clear margins. ER: 90%, PR 0%; Her 2 neu not overexpressed.    CATARACT EXTRACTION W/PHACO Left 02/03/2017   Procedure: CATARACT EXTRACTION PHACO AND INTRAOCULAR LENS PLACEMENT (IOC);  Surgeon: Kristin Robson, MD;  Location: [facility];  Service: Ophthalmology;  Laterality: Left;  [place]  00:28 AP%  16.3 CDE 4.68 Fluid pack lot # [id number]   CATARACT EXTRACTION W/PHACO Right 03/01/2019   Procedure: CATARACT EXTRACTION PHACO AND INTRAOCULAR LENS PLACEMENT ([place])  RIGHT;  Surgeon: Kristin Robson, MD;  Location: [place];  Service: Ophthalmology;  Laterality: Right;   COLONOSCOPY     COLONOSCOPY WITH PROPOFOL N/A 06/27/2022   Procedure: COLONOSCOPY WITH PROPOFOL;  Surgeon: Kristin Lame, MD;  Location: [facility];  Service: Endoscopy;  Laterality: N/A;   ESOPHAGOGASTRODUODENOSCOPY (EGD) WITH PROPOFOL N/A 06/27/2022   Procedure: ESOPHAGOGASTRODUODENOSCOPY (EGD) WITH PROPOFOL;  Surgeon: Kristin Lame, MD;  Location: [place];  Service: Endoscopy;  Laterality: N/A;   PARTIAL COLECTOMY N/A 07/29/2022   Procedure: PARTIAL COLECTOMY, open left, RNFA to assist;  Surgeon: Kristin Husbands, MD;  Location: [facility];  Service: General;  Laterality: N/A;    SOCIAL HISTORY:   Social History   Tobacco Use   Smoking status: Never   Smokeless tobacco: Never   Tobacco comments:    smoking cessation materials not required  Substance Use Topics   Alcohol use: No    FAMILY HISTORY:   Family History  Problem Relation Age of Onset   Mental illness Mother        Dementia   Stroke Father    Cancer Brother        Lung Cancer   Breast cancer Other     DRUG ALLERGIES:   Allergies  Allergen Reactions   Combigan [Brimonidine Tartrate-Timolol]     Redness, burning, irritation    Lisinopril Cough   Other Rash    SUTURES-(PARTIAL HYSTERECTOMY)    REVIEW OF SYSTEMS:   ROS As per history of present illness. All pertinent systems were reviewed above. Constitutional, HEENT, cardiovascular, respiratory, GI, GU, musculoskeletal, neuro, psychiatric, endocrine, integumentary and hematologic systems were reviewed and are otherwise negative/unremarkable except for positive findings mentioned above in the HPI.   MEDICATIONS AT HOME:   Prior to Admission medications    Medication Sig Start Date End Date Taking? Authorizing Provider  acetaminophen (TYLENOL) 500 MG tablet Take 500 mg by mouth every 6 (six) hours as needed for pain.    [provider]  ibuprofen (ADVIL) 600 MG tablet Take 1 tablet (600 mg total) by mouth every 6 (six) hours as needed. 08/01/22   Tylene Fantasia, PA-C  latanoprost (XALATAN) 0.005 % ophthalmic solution 1 drop at bedtime. Eye doctor 12/18/20   [provider]  losartan-hydrochlorothiazide (HYZAAR) 100-12.5 MG tablet Take 1 tablet by mouth daily. 01/30/22   Juline Patch, MD  methocarbamol (ROBAXIN) 500 MG tablet Take 1 tablet (500 mg total) by mouth every 8 (eight) hours as needed for muscle spasms. 08/01/22   Tylene Fantasia, PA-C  ondansetron (ZOFRAN-ODT) 4 MG disintegrating tablet Take 1 tablet (4 mg total) by mouth every 6 (six) hours as needed for nausea. 08/01/22   Tylene Fantasia, PA-C  oxyCODONE (  OXY IR/ROXICODONE) 5 MG immediate release tablet Take 1 tablet (5 mg total) by mouth every 6 (six) hours as needed for severe pain or breakthrough pain. 08/01/22   Tylene Fantasia, PA-C  potassium chloride SA (KLOR-CON M) 20 MEQ tablet Take 1 tablet (20 mEq total) by mouth 2 (two) times daily for 14 days. 08/01/22 08/15/22  Tylene Fantasia, PA-C  promethazine-dextromethorphan (PROMETHAZINE-DM) 6.25-15 MG/5ML syrup Take 2.5 mLs by mouth 4 (four) times daily as needed. 07/19/22   Lyndee Hensen, DO      VITAL SIGNS:  Blood pressure 124/62, pulse (!) 105, temperature 98.1 F (36.7 C), temperature source Oral, resp. rate 20, height 5' (1.524 m), weight 56.7 kg, SpO2 100 %.  PHYSICAL EXAMINATION:  Physical Exam  GENERAL:  87 y.o.-year-old Caucasian female patient lying in the bed with no acute distress.  EYES: Pupils equal, round, reactive to light and accommodation. No scleral icterus. Extraocular muscles intact.  HEENT: Head atraumatic, normocephalic. Oropharynx and nasopharynx clear.  NECK:  Supple, no  jugular venous distention. No thyroid enlargement, no tenderness.  LUNGS: Normal breath sounds bilaterally, no wheezing, rales,rhonchi or crepitation. No use of accessory muscles of respiration.  CARDIOVASCULAR: Regular rate and rhythm, S1, S2 normal. No murmurs, rubs, or gallops.  ABDOMEN: Soft, nondistended, nontender. Bowel sounds present. No organomegaly or mass.  Colostomy in place with no output. EXTREMITIES: No pedal edema, cyanosis, or clubbing.  NEUROLOGIC: Cranial nerves II through XII are intact. Muscle strength 5/5 in all extremities. Sensation intact. Gait not checked.  PSYCHIATRIC: The patient is alert and oriented x 3.  Normal affect and good eye contact. SKIN: No obvious rash, lesion, or ulcer.   LABORATORY PANEL:   CBC Recent Labs  Lab 08/02/22 2339 08/03/22 0232  WBC 21.3*  --   HGB 9.5* 9.5*  HCT 30.6* 30.7*  PLT 617*  --    ------------------------------------------------------------------------------------------------------------------  Chemistries  Recent Labs  Lab 08/01/22 0340 08/02/22 2339  NA 131* 134*  K 3.7 3.6  CL 107 104  CO2 19* 22  GLUCOSE 104* 116*  BUN 13 15  CREATININE 0.79 0.58  CALCIUM 7.6* 8.1*  MG 1.9  --   AST  --  27  ALT  --  18  ALKPHOS  --  77  BILITOT  --  0.7   ------------------------------------------------------------------------------------------------------------------  Cardiac Enzymes No results for input(s): "TROPONINI" in the last 168 hours. ------------------------------------------------------------------------------------------------------------------  RADIOLOGY:  CT ANGIO GI BLEED  Result Date: 08/03/2022 CLINICAL DATA:  Lower GI bleed, rectal bleeding. Status post colectomy with ostomy. EXAM: CTA ABDOMEN AND PELVIS WITHOUT AND WITH CONTRAST TECHNIQUE: Multidetector CT imaging of the abdomen and pelvis was performed using the standard protocol during bolus administration of intravenous contrast. Multiplanar  reconstructed images and MIPs were obtained and reviewed to evaluate the vascular anatomy. RADIATION DOSE REDUCTION: This exam was performed according to the departmental dose-optimization program which includes automated exposure control, adjustment of the mA and/or kV according to patient size and/or use of iterative reconstruction technique. CONTRAST:  160m OMNIPAQUE IOHEXOL 350 MG/ML SOLN COMPARISON:  07/03/2022 FINDINGS: VASCULAR Aorta: Atherosclerotic calcifications and irregularity. No aneurysm or dissection. Celiac: Mild-to-moderate narrowing proximally, patent. No aneurysm or dissection. SMA: Widely patent Renals: Widely patent IMA: Patent. Inflow: Atherosclerotic calcifications.  No aneurysm or dissection. Proximal Outflow: No aneurysm or dissection. Veins: No obvious venous abnormality within the limitations of this arterial phase study. No focal hepatic abnormality. Gallbladder unremarkable. Review of the MIP images confirms the above findings. NON-VASCULAR Lower  chest: Trace bilateral effusions. Bibasilar atelectasis. Moderate to large hiatal hernia. Distal esophagus appears thick walled suggesting esophagitis. Fluid noted within the distal esophagus which could be related to reflux or dysmotility. Hepatobiliary: No focal hepatic abnormality. Gallbladder unremarkable. Pancreas: No focal abnormality or ductal dilatation. Spleen: No focal abnormality.  Normal size. Adrenals/Urinary Tract: No adrenal abnormality. No focal renal abnormality. No stones or hydronephrosis. Urinary bladder is unremarkable. Stomach/Bowel: Changes of partial colectomy with Hartmann's pouch and left lower quadrant ostomy. No active extravasation of contrast. Stomach is moderately distended with fluid as is the duodenum and proximal jejunum. Gradual decreased caliber of small bowel. Mid to distal small bowel are decompressed. Lymphatic: No adenopathy Reproductive: Prior hysterectomy.  No adnexal masses. Other: Moderate free air in  the abdomen and pelvis, presumably from recent surgery. Gas also seen within the subcutaneous soft tissues in the anterior abdominal wall. Trace free fluid in the cul-de-sac. Musculoskeletal: No acute bony abnormality. Severe chronic compression fracture at T12 with vertebral plana. IMPRESSION: VASCULAR No active contrast extravasation to suggest or localize active GI bleed. Aortic atherosclerosis. NON-VASCULAR Postoperative changes from left colectomy. Moderate free air in the abdomen and pelvis likely related to recent postoperative state. Mildly distended stomach and proximal small bowel with gradual decompression. This favors ileus although a degree of small-bowel obstruction cannot be excluded. Fluid-filled distal esophagus with moderate-sized hiatal hernia. Distal esophagus appears thick walled suggesting esophagitis. Electronically Signed   By: Rolm Baptise M.D.   On: 08/03/2022 00:37      IMPRESSION AND PLAN:  Assessment and Plan: * GI bleeding - The patient will be admitted to a medical telemetry bed. - The patient is status post partial colectomy for colon cancer. - We will follow serial hemoglobins and hematocrits. - Ibuprofen will be held off. - General surgery consult to be obtained. - Dr. Christian Mate was notified about the patient.  SBO (small bowel obstruction) ([place]) - Will keep her n.p.o. - She will be hydrated with IV normal saline. - We will optimize electrolytes. - General surgery consult to be obtained as mentioned above.  Essential hypertension - We will continue her antihypertensives.   DVT prophylaxis: SCDs. Advanced Care Planning:  Code Status: DNR/DNI.  This was discussed with. Family Communication:  The plan of care was discussed in details with the patient (and family). I answered all questions. The patient agreed to proceed with the above mentioned plan. Further management will depend upon hospital course. Disposition Plan: Back to previous home  environment Consults called: General surgery All the records are reviewed and case discussed with ED provider.  Status is: Inpatient   At the time of the admission, it appears that the appropriate admission status for this patient is inpatient.  This is judged to be reasonable and necessary in order to provide the required intensity of service to ensure the patient's safety given the presenting symptoms, physical exam findings and initial radiographic and laboratory data in the context of comorbid conditions.  The patient requires inpatient status due to high intensity of service, high risk of further deterioration and high frequency of surveillance required.  I certify that at the time of admission, it is my clinical judgment that the patient will require inpatient hospital care extending more than 2 midnights.                            Dispo: The patient is from: Home  Anticipated d/c is to: Home              Patient currently is not medically stable to d/c.              Difficult to place patient: No  Christel Mormon M.D on 08/03/2022 at 5:35 AM  [facility]   From 7 PM-7 AM, contact night-coverage [email or web address]  CC: Primary care physician; Kristin Few, DO

## 2022-08-03 NOTE — ED Notes (Signed)
Assisted pt to in room toilet with help of husband who is at bedside.

## 2022-08-03 NOTE — ED Notes (Signed)
Advised nurse that patient has ready bed 

## 2022-08-03 NOTE — ED Notes (Signed)
Pt NG tube clamped and trial ice chips/liquids have been given to pt. Pt educated on how to slowly introduce fluids to stomach.

## 2022-08-03 NOTE — Assessment & Plan Note (Addendum)
-  The patient will be admitted to a medical telemetry bed. - The patient is status post partial colectomy for colon cancer. - We will follow serial hemoglobins and hematocrits. - Ibuprofen will be held off. - General surgery consult to be obtained. - Dr. Christian Mate was notified about the patient.

## 2022-08-03 NOTE — ED Notes (Signed)
Pt has been able to tolerate ice chips without any nausea or vomiting for about 1 hr 30 mins. MD notified.

## 2022-08-03 NOTE — Consult Note (Signed)
[facility] SURGICAL CONSULTATION NOTE (initial)   HISTORY OF PRESENT ILLNESS (HPI):  87 y.o. female presented to [facility] ED last night for evaluation of rectal bleeding and upper abdominal pain. Patient reports small-volume bright red blood with a rectal passage of stool.  Diminished output from stoma, with replaced appliance at 4:30 PM yesterday and no output of gas or stool since.  Denies nausea or vomiting.  Reports feeling significantly better after placement of NG tube last night with reported large volume of bilious NG output.  Only 200 to 300 mL overnight.  Surgery is consulted by hospitalist physician Dr. Posey Pronto in this context for evaluation and management of postoperative ileus.  PAST MEDICAL HISTORY (PMH):  Past Medical History:  Diagnosis Date   Allergy    Anemia    H/O AS A CHILD   Arthritis    Back pain    Breast cancer of upper-outer quadrant of right female breast ([place]) 06/12/2015   4 millimeter, T1a, N0; ER 90%, PR 0%, HER-2/neu not overexpressed.  Declined radiation therapy.   Collar bone fracture 05/28/2013   Fractured Collar bone on left   Dyspnea    Dysrhythmia    h/o heart skipping beat years ago   Frequent headaches    h/o migraines   Hypertension    Osteopenia 04/2018   Bone density without significant interval change since 2017.   Pneumonia      PAST SURGICAL HISTORY [facility]):  Past Surgical History:  Procedure Laterality Date   ABDOMINAL HYSTERECTOMY  1970   Partial   BREAST BIOPSY Right 05/28/2015   INVASIVE MAMMARY CARCINOMA    BREAST BIOPSY Right 06/12/2015   Wide excision, sentinel lymph node biopsy.   BREAST LUMPECTOMY WITH AXILLARY LYMPH NODE DISSECTION Right 06/12/2015   4 mm, T1a,N0 withDCIS with clear margins. ER: 90%, PR 0%; Her 2 neu not overexpressed.    CATARACT EXTRACTION W/PHACO Left 02/03/2017   Procedure: CATARACT EXTRACTION PHACO AND INTRAOCULAR LENS PLACEMENT (IOC);  Surgeon: Birder Robson, MD;  Location: [facility] ORS;   Service: Ophthalmology;  Laterality: Left;  [place]  00:28 AP% 16.3 CDE 4.68 Fluid pack lot # [id number]   CATARACT EXTRACTION W/PHACO Right 03/01/2019   Procedure: CATARACT EXTRACTION PHACO AND INTRAOCULAR LENS PLACEMENT ([place])  RIGHT;  Surgeon: Birder Robson, MD;  Location: [place];  Service: Ophthalmology;  Laterality: Right;   COLONOSCOPY     COLONOSCOPY WITH PROPOFOL N/A 06/27/2022   Procedure: COLONOSCOPY WITH PROPOFOL;  Surgeon: [name], MD;  Location: [place];  Service: Endoscopy;  Laterality: N/A;   ESOPHAGOGASTRODUODENOSCOPY (EGD) WITH PROPOFOL N/A 06/27/2022   Procedure: ESOPHAGOGASTRODUODENOSCOPY (EGD) WITH PROPOFOL;  Surgeon: [name], MD;  Location: [place];  Service: Endoscopy;  Laterality: N/A;   PARTIAL COLECTOMY N/A 07/29/2022   Procedure: PARTIAL COLECTOMY, open left, RNFA to assist;  Surgeon: Jules Husbands, MD;  Location: [facility];  Service: General;  Laterality: N/A;     MEDICATIONS:  Prior to Admission medications   Medication Sig Start Date End Date Taking? Authorizing Provider  latanoprost (XALATAN) 0.005 % ophthalmic solution 1 drop at bedtime. Eye doctor 12/18/20  Yes [provider]  losartan-hydrochlorothiazide (HYZAAR) 100-12.5 MG tablet Take 1 tablet by mouth daily. 01/30/22  Yes Juline Patch, MD  potassium chloride SA (KLOR-CON M) 20 MEQ tablet Take 1 tablet (20 mEq total) by mouth 2 (two) times daily for 14 days. 08/01/22 08/15/22 Yes Tylene Fantasia, PA-C  acetaminophen (TYLENOL) 500 MG tablet Take 500  mg by mouth every 6 (six) hours as needed for pain.    [provider]  ibuprofen (ADVIL) 600 MG tablet Take 1 tablet (600 mg total) by mouth every 6 (six) hours as needed. 08/01/22   Tylene Fantasia, PA-C  methocarbamol (ROBAXIN) 500 MG tablet Take 1 tablet (500 mg total) by mouth every 8 (eight) hours as needed for muscle spasms. 08/01/22   Tylene Fantasia, PA-C  ondansetron (ZOFRAN-ODT) 4 MG  disintegrating tablet Take 1 tablet (4 mg total) by mouth every 6 (six) hours as needed for nausea. 08/01/22   Tylene Fantasia, PA-C  oxyCODONE (OXY IR/ROXICODONE) 5 MG immediate release tablet Take 1 tablet (5 mg total) by mouth every 6 (six) hours as needed for severe pain or breakthrough pain. 08/01/22   Tylene Fantasia, PA-C  promethazine-dextromethorphan (PROMETHAZINE-DM) 6.25-15 MG/5ML syrup Take 2.5 mLs by mouth 4 (four) times daily as needed. 07/19/22   Lyndee Hensen, DO     ALLERGIES:  Allergies  Allergen Reactions   Combigan [Brimonidine Tartrate-Timolol]     Redness, burning, irritation    Lisinopril Cough   Other Rash    SUTURES-(PARTIAL HYSTERECTOMY)     SOCIAL HISTORY:  Social History   Socioeconomic History   Marital status: Married    Spouse name: ,Meda Coffee   Number of children: 3   Years of education: GED   Highest education level: 12th grade  Occupational History   Occupation: Retired  Tobacco Use   Smoking status: Never   Smokeless tobacco: Never   Tobacco comments:    smoking cessation materials not required  Vaping Use   Vaping Use: Never used  Substance and Sexual Activity   Alcohol use: No   Drug use: No   Sexual activity: Not Currently  Other Topics Concern   Not on file  Social History Narrative   Not on file   Social Determinants of Health   Financial Resource Strain: Low Risk  (09/25/2021)   Overall Financial Resource Strain (CARDIA)    Difficulty of Paying Living Expenses: Not hard at all  Food Insecurity: No Food Insecurity (07/30/2022)   Hunger Vital Sign    Worried About Running Out of Food in the Last Year: Never true    [place] in the Last Year: Never true  Transportation Needs: No Transportation Needs (07/30/2022)   PRAPARE - Hydrologist (Medical): No    Lack of Transportation (Non-Medical): No  Physical Activity: Inactive (09/25/2021)   Exercise Vital Sign    Days of Exercise per Week: 0 days     Minutes of Exercise per Session: 0 min  Stress: No Stress Concern Present (09/25/2021)   [place]    Feeling of Stress : Not at all  Social Connections: Moderately Integrated (09/25/2021)   Social Connection and Isolation Panel [NHANES]    Frequency of Communication with Friends and Family: More than three times a week    Frequency of Social Gatherings with Friends and Family: More than three times a week    Attends Religious Services: More than 4 times per year    Active Member of [employer] or Organizations: No    Attends Archivist Meetings: Never    Marital Status: Married  Human resources officer Violence: Not At Risk (07/30/2022)   Humiliation, Afraid, Rape, and Kick questionnaire    Fear of Current or Ex-Partner: No    Emotionally Abused: No  Physically Abused: No    Sexually Abused: No     FAMILY HISTORY:  Family History  Problem Relation Age of Onset   Mental illness Mother        Dementia   Stroke Father    Cancer Brother        Lung Cancer   Breast cancer Other       REVIEW OF SYSTEMS:  ROS  VITAL SIGNS:  Temp:  [97.9 F (36.6 C)-98.5 F (36.9 C)] 98.5 F (36.9 C) (01/14 0844) Pulse Rate:  [94-105] 96 (01/14 1330) Resp:  [11-26] 22 (01/14 1330) BP: (90-144)/(47-77) 117/62 (01/14 1330) SpO2:  [95 %-100 %] 97 % (01/14 1330) Weight:  [56.7 kg] 56.7 kg (01/13 2237)     Height: 5' (152.4 cm) Weight: 56.7 kg BMI (Calculated): 24.41   INTAKE/OUTPUT:  01/13 0701 - 01/14 0700 In: 263.8 [I.V.:263.8] Out: -   PHYSICAL EXAM:  Physical Exam Blood pressure 117/62, pulse 96, temperature 98.5 F (36.9 C), temperature source Oral, resp. rate (!) 22, height 5' (1.524 m), weight 56.7 kg, SpO2 97 %. Last Weight  Most recent update: 08/02/2022 10:38 PM    Weight  56.7 kg (125 lb)             CONSTITUTIONAL: Well developed, and nourished, appropriately responsive and aware without distress.   EYES:  Sclera non-icteric.   EARS, NOSE, MOUTH AND THROAT: NG tube in place.  The oropharynx is clear. Oral mucosa is pink and moist   Hearing is intact to voice.  NECK: Trachea is midline, and there is no jugular venous distension.  LYMPH NODES:  Lymph nodes in the neck are not enlarged. RESPIRATORY:  Normal respiratory effort without pathologic use of accessory muscles. CARDIOVASCULAR: Heart is regular in rate and rhythm. GI: The abdomen is soft, midline incision is clean dry and intact with staples in place, with expected postoperative tenderness, and nondistended. There were hypoactive bowel sounds.  There is a left-sided colostomy, with mild edema to the stoma and with no stool or gas within the bag. MUSCULOSKELETAL:  Symmetrical muscle tone appreciated in all four extremities.    SKIN: Skin turgor is normal. No pathologic skin lesions appreciated.  NEUROLOGIC:  Motor and sensation appear grossly normal.  Cranial nerves are grossly without defect. PSYCH:  Alert and oriented to person, place and time. Affect is appropriate for situation.  Data Reviewed I have personally reviewed what is currently available of the patient's imaging, recent labs and medical records.    Labs:     Latest Ref Rng & Units 08/03/2022    8:43 AM 08/03/2022    5:40 AM 08/03/2022    2:32 AM  CBC  WBC 4.0 - 10.5 K/uL  21.1    Hemoglobin 12.0 - 15.0 g/dL 8.8  9.8  9.5   Hematocrit 36.0 - 46.0 % 28.4  31.8  30.7   Platelets 150 - 400 K/uL  636        Latest Ref Rng & Units 08/03/2022    5:40 AM 08/02/2022   11:39 PM 08/01/2022    3:40 AM  CMP  Glucose 70 - 99 mg/dL 124  116  104   BUN 8 - 23 mg/dL '11  15  13   '$ Creatinine 0.44 - 1.00 mg/dL 0.58  0.58  0.79   Sodium 135 - 145 mmol/L 134  134  131   Potassium 3.5 - 5.1 mmol/L 3.6  3.6  3.7   Chloride 98 - 111 mmol/L  104  104  107   CO2 22 - 32 mmol/L '20  22  19   '$ Calcium 8.9 - 10.3 mg/dL 7.8  8.1  7.6   Total Protein 6.5 - 8.1 g/dL  5.6    Total Bilirubin 0.3 - 1.2  mg/dL  0.7    Alkaline Phos 38 - 126 U/L  77    AST 15 - 41 U/L  27    ALT 0 - 44 U/L  18       Imaging studies:   Last 24 hrs: DG Abdomen 1 View  Result Date: 08/03/2022 CLINICAL DATA:  87 year old female status post nasogastric tube placement. EXAM: ABDOMEN - 1 VIEW COMPARISON:  No priors. FINDINGS: Nasogastric tube in position, with tip projecting over the lower left hemithorax. The tube appears coiled, with tip likely in the stomach, however, this appears to be within the portion of the stomach that is intrathoracic (the patient had a moderate to large hiatal hernia on prior CT examination). Visualized bowel-gas pattern is unremarkable. Skin staples are evident in the midline of the mid abdomen. IMPRESSION: 1. Poorly positioned nasogastric tube which appears coiled in the proximal stomach, likely with tip in a hiatal hernia. Advancement of the nasogastric tube is suggested for more optimal positioning. Electronically Signed   By: Vinnie Langton M.D.   On: 08/03/2022 06:19   CT ANGIO GI BLEED  Result Date: 08/03/2022 CLINICAL DATA:  Lower GI bleed, rectal bleeding. Status post colectomy with ostomy. EXAM: CTA ABDOMEN AND PELVIS WITHOUT AND WITH CONTRAST TECHNIQUE: Multidetector CT imaging of the abdomen and pelvis was performed using the standard protocol during bolus administration of intravenous contrast. Multiplanar reconstructed images and MIPs were obtained and reviewed to evaluate the vascular anatomy. RADIATION DOSE REDUCTION: This exam was performed according to the departmental dose-optimization program which includes automated exposure control, adjustment of the mA and/or kV according to patient size and/or use of iterative reconstruction technique. CONTRAST:  157m OMNIPAQUE IOHEXOL 350 MG/ML SOLN COMPARISON:  07/03/2022 FINDINGS: VASCULAR Aorta: Atherosclerotic calcifications and irregularity. No aneurysm or dissection. Celiac: Mild-to-moderate narrowing proximally, patent. No  aneurysm or dissection. SMA: Widely patent Renals: Widely patent IMA: Patent. Inflow: Atherosclerotic calcifications.  No aneurysm or dissection. Proximal Outflow: No aneurysm or dissection. Veins: No obvious venous abnormality within the limitations of this arterial phase study. No focal hepatic abnormality. Gallbladder unremarkable. Review of the MIP images confirms the above findings. NON-VASCULAR Lower chest: Trace bilateral effusions. Bibasilar atelectasis. Moderate to large hiatal hernia. Distal esophagus appears thick walled suggesting esophagitis. Fluid noted within the distal esophagus which could be related to reflux or dysmotility. Hepatobiliary: No focal hepatic abnormality. Gallbladder unremarkable. Pancreas: No focal abnormality or ductal dilatation. Spleen: No focal abnormality.  Normal size. Adrenals/Urinary Tract: No adrenal abnormality. No focal renal abnormality. No stones or hydronephrosis. Urinary bladder is unremarkable. Stomach/Bowel: Changes of partial colectomy with Hartmann's pouch and left lower quadrant ostomy. No active extravasation of contrast. Stomach is moderately distended with fluid as is the duodenum and proximal jejunum. Gradual decreased caliber of small bowel. Mid to distal small bowel are decompressed. Lymphatic: No adenopathy Reproductive: Prior hysterectomy.  No adnexal masses. Other: Moderate free air in the abdomen and pelvis, presumably from recent surgery. Gas also seen within the subcutaneous soft tissues in the anterior abdominal wall. Trace free fluid in the cul-de-sac. Musculoskeletal: No acute bony abnormality. Severe chronic compression fracture at T12 with vertebral plana. IMPRESSION: VASCULAR No active contrast extravasation to suggest or localize active GI  bleed. Aortic atherosclerosis. NON-VASCULAR Postoperative changes from left colectomy. Moderate free air in the abdomen and pelvis likely related to recent postoperative state. Mildly distended stomach and  proximal small bowel with gradual decompression. This favors ileus although a degree of small-bowel obstruction cannot be excluded. Fluid-filled distal esophagus with moderate-sized hiatal hernia. Distal esophagus appears thick walled suggesting esophagitis. Electronically Signed   By: Rolm Baptise M.D.   On: 08/03/2022 00:37     Assessment/Plan:  87 y.o. female with limited rectal bleed, postoperative ileus, persistent leukocytosis, complicated by pertinent comorbidities including:  Patient Active Problem List   Diagnosis Date Noted   GI bleeding 08/03/2022   SBO (small bowel obstruction) ([place]) 08/03/2022   Essential hypertension 08/03/2022   S/P partial resection of colon 07/29/2022   Malnutrition ([place]) 07/05/2022   Goals of care, counseling/discussion 07/05/2022   Malignant neoplasm of colon ([place]) 07/04/2022   Iron deficiency anemia due to chronic blood loss 06/27/2022   Intestines neoplasm 06/27/2022   Primary open angle glaucoma (POAG) of both eyes, moderate stage 05/14/2022   Contusion of thigh 08/02/2018   BP (high blood pressure) 11/15/2015   OP (osteoporosis) 11/15/2015   Primary cancer of lower-outer quadrant of right breast ([place]) 06/20/2015   Low back pain 08/02/2014   HTN (hypertension) 08/02/2014   Basal cell carcinoma in situ of skin of left shoulder 07/31/2014   Microcalcifications of the breast 11/28/2013   Abnormal finding on mammography 10/03/2013   Skin cancer 10/03/2013   Mammographic microcalcification 05/04/2013   Chronic bilateral low back pain without sciatica 03/29/2012    -NG tube to low intermittent suction, may clamp and trial sips with ice chips should ostomy output resume.  -IV fluid support for now, agree with serial H&H's.  -Appreciate medical admission.  -PPI prophylaxis  All of the above findings and recommendations were discussed with the patient and  family(if present), and all of patient's and present family's questions were answered to their  expressed satisfaction.  Thank you for the opportunity to participate in this patient's care.   -- Ronny Bacon, M.D., FACS 08/03/2022, 1:46 PM

## 2022-08-04 ENCOUNTER — Ambulatory Visit: Payer: Medicare HMO | Admitting: Family Medicine

## 2022-08-04 ENCOUNTER — Inpatient Hospital Stay: Payer: Medicare HMO

## 2022-08-04 ENCOUNTER — Encounter: Payer: Self-pay | Admitting: Family Medicine

## 2022-08-04 DIAGNOSIS — K625 Hemorrhage of anus and rectum: Secondary | ICD-10-CM

## 2022-08-04 DIAGNOSIS — K209 Esophagitis, unspecified without bleeding: Secondary | ICD-10-CM | POA: Diagnosis not present

## 2022-08-04 DIAGNOSIS — D509 Iron deficiency anemia, unspecified: Secondary | ICD-10-CM

## 2022-08-04 DIAGNOSIS — K2100 Gastro-esophageal reflux disease with esophagitis, without bleeding: Secondary | ICD-10-CM | POA: Insufficient documentation

## 2022-08-04 DIAGNOSIS — K56609 Unspecified intestinal obstruction, unspecified as to partial versus complete obstruction: Secondary | ICD-10-CM | POA: Diagnosis not present

## 2022-08-04 LAB — BASIC METABOLIC PANEL
Anion gap: 7 (ref 5–15)
BUN: 7 mg/dL — ABNORMAL LOW (ref 8–23)
CO2: 23 mmol/L (ref 22–32)
Calcium: 7.6 mg/dL — ABNORMAL LOW (ref 8.9–10.3)
Chloride: 105 mmol/L (ref 98–111)
Creatinine, Ser: 0.58 mg/dL (ref 0.44–1.00)
GFR, Estimated: >60 mL/min (ref >60)
Glucose, Bld: 84 mg/dL (ref 70–99)
Potassium: 3.5 mmol/L (ref 3.5–5.1)
Sodium: 135 mmol/L (ref 135–145)

## 2022-08-04 LAB — CBC
HCT: 26.6 % — ABNORMAL LOW (ref 36.0–46.0)
Hemoglobin: 8.2 g/dL — ABNORMAL LOW (ref 12.0–15.0)
MCH: 23.8 pg — ABNORMAL LOW (ref 26.0–34.0)
MCHC: 30.8 g/dL (ref 30.0–36.0)
MCV: 77.3 fL — ABNORMAL LOW (ref 80.0–100.0)
Platelets: 472 10*3/uL — ABNORMAL HIGH (ref 150–400)
RBC: 3.44 MIL/uL — ABNORMAL LOW (ref 3.87–5.11)
RDW: 21.1 % — ABNORMAL HIGH (ref 11.5–15.5)
WBC: 11.9 10*3/uL — ABNORMAL HIGH (ref 4.0–10.5)
nRBC: 0 % (ref 0.0–0.2)

## 2022-08-04 MED ORDER — FERROUS FUMARATE 324 (106 FE) MG PO TABS
1.0000 | ORAL_TABLET | Freq: Two times a day (BID) | ORAL | Status: DC
  Administered 2022-08-04 (×2): 106 mg via ORAL
  Filled 2022-08-04 (×3): qty 1

## 2022-08-04 MED ORDER — ACETAMINOPHEN 650 MG RE SUPP: 650.0000 mg | Freq: Four times a day (QID) | RECTAL | Status: DC | PRN

## 2022-08-04 MED ORDER — ACETAMINOPHEN 325 MG PO TABS: 650.0000 mg | ORAL_TABLET | Freq: Four times a day (QID) | ORAL | Status: DC | PRN

## 2022-08-04 NOTE — Progress Notes (Signed)
Mobility Specialist - Progress Note   08/04/22 1550  Mobility  Activity Ambulated independently in hallway  Level of Assistance Modified independent, requires aide device or extra time  Assistive Device Front wheel walker  Distance Ambulated (ft) 160 ft  Activity Response Tolerated well  $Mobility charge 1 Mobility   Pt standing in bathroom upon entry, utilizing RA. Pt amb one lap around NS with RW, tolerated well. Pt denied dizziness, SOB or fatigue during amb. Pt left supine with needs within reach and family present at bedside.   Candie Mile Mobility Specialist 08/04/22 3:52 PM

## 2022-08-04 NOTE — Plan of Care (Signed)
  Problem: Education: Goal: Knowledge of General Education information will improve Description: Including pain rating scale, medication(s)/side effects and non-pharmacologic comfort measures Outcome: Progressing   Problem: Health Behavior/Discharge Planning: Goal: Ability to manage health-related needs will improve Outcome: Progressing   Problem: Clinical Measurements: Goal: Will remain free from infection Outcome: Progressing   Problem: Nutrition: Goal: Adequate nutrition will be maintained Outcome: Progressing   Problem: Coping: Goal: Level of anxiety will decrease Outcome: Progressing   Problem: Elimination: Goal: Will not experience complications related to bowel motility Outcome: Progressing Goal: Will not experience complications related to urinary retention Outcome: Progressing   Problem: Pain Managment: Goal: General experience of comfort will improve Outcome: Progressing   Problem: Safety: Goal: Ability to remain free from injury will improve Outcome: Progressing   Problem: Skin Integrity: Goal: Risk for impaired skin integrity will decrease Outcome: Progressing

## 2022-08-04 NOTE — Progress Notes (Signed)
[facility] [place] NAME: Kristin Miller    MR#:  [id number]  DATE OF BIRTH:  1936/04/23  SUBJECTIVE:  NG tube d/ced. Pt feels better Ostomy has stool. No vomiting or nausea FLD per gen surgery No rectal bleed    VITALS:  Blood pressure (!) 136/56, pulse 89, temperature 98.2 F (36.8 C), temperature source Oral, resp. rate 20, height 5' (1.524 m), weight 56.7 kg, SpO2 95 %.  PHYSICAL EXAMINATION:   GENERAL:  87 y.o.-year-old patient lying in the bed with no acute distress.  LUNGS: Normal breath sounds bilaterally, no wheezing CARDIOVASCULAR: S1, S2 normal. No murmur   ABDOMEN: Soft, nontender, nondistended. ostomy + with stool EXTREMITIES: No  edema b/l.    NEUROLOGIC: nonfocal  patient is alert and awake  LABORATORY PANEL:  CBC Recent Labs  Lab 08/04/22 0811  WBC 11.9*  HGB 8.2*  HCT 26.6*  PLT 472*     Chemistries  Recent Labs  Lab 08/01/22 0340 08/02/22 2339 08/03/22 0540 08/04/22 0811  NA 131* 134*   < > 135  K 3.7 3.6   < > 3.5  CL 107 104   < > 105  CO2 19* 22   < > 23  GLUCOSE 104* 116*   < > 84  BUN 13 15   < > 7*  CREATININE 0.79 0.58   < > 0.58  CALCIUM 7.6* 8.1*   < > 7.6*  MG 1.9  --   --   --   AST  --  27  --   --   ALT  --  18  --   --   ALKPHOS  --  77  --   --   BILITOT  --  0.7  --   --    < > = values in this interval not displayed.    Cardiac Enzymes No results for input(s): "TROPONINI" in the last 168 hours. RADIOLOGY:  DG Abdomen 1 View  Result Date: 08/03/2022 CLINICAL DATA:  87 year old female status post nasogastric tube placement. EXAM: ABDOMEN - 1 VIEW COMPARISON:  No priors. FINDINGS: Nasogastric tube in position, with tip projecting over the lower left hemithorax. The tube appears coiled, with tip likely in the stomach, however, this appears to be within the portion of the stomach that is intrathoracic (the patient had a moderate to large hiatal hernia on prior CT examination).  Visualized bowel-gas pattern is unremarkable. Skin staples are evident in the midline of the mid abdomen. IMPRESSION: 1. Poorly positioned nasogastric tube which appears coiled in the proximal stomach, likely with tip in a hiatal hernia. Advancement of the nasogastric tube is suggested for more optimal positioning. Electronically Signed   By: Vinnie Langton M.D.   On: 08/03/2022 06:19   CT ANGIO GI BLEED  Result Date: 08/03/2022 CLINICAL DATA:  Lower GI bleed, rectal bleeding. Status post colectomy with ostomy. EXAM: CTA ABDOMEN AND PELVIS WITHOUT AND WITH CONTRAST TECHNIQUE: Multidetector CT imaging of the abdomen and pelvis was performed using the standard protocol during bolus administration of intravenous contrast. Multiplanar reconstructed images and MIPs were obtained and reviewed to evaluate the vascular anatomy. RADIATION DOSE REDUCTION: This exam was performed according to the departmental dose-optimization program which includes automated exposure control, adjustment of the mA and/or kV according to patient size and/or use of iterative reconstruction technique. CONTRAST:  151m OMNIPAQUE IOHEXOL 350 MG/ML SOLN COMPARISON:  07/03/2022 FINDINGS: VASCULAR Aorta: Atherosclerotic calcifications and irregularity. No  aneurysm or dissection. Celiac: Mild-to-moderate narrowing proximally, patent. No aneurysm or dissection. SMA: Widely patent Renals: Widely patent IMA: Patent. Inflow: Atherosclerotic calcifications.  No aneurysm or dissection. Proximal Outflow: No aneurysm or dissection. Veins: No obvious venous abnormality within the limitations of this arterial phase study. No focal hepatic abnormality. Gallbladder unremarkable. Review of the MIP images confirms the above findings. NON-VASCULAR Lower chest: Trace bilateral effusions. Bibasilar atelectasis. Moderate to large hiatal hernia. Distal esophagus appears thick walled suggesting esophagitis. Fluid noted within the distal esophagus which could be  related to reflux or dysmotility. Hepatobiliary: No focal hepatic abnormality. Gallbladder unremarkable. Pancreas: No focal abnormality or ductal dilatation. Spleen: No focal abnormality.  Normal size. Adrenals/Urinary Tract: No adrenal abnormality. No focal renal abnormality. No stones or hydronephrosis. Urinary bladder is unremarkable. Stomach/Bowel: Changes of partial colectomy with Hartmann's pouch and left lower quadrant ostomy. No active extravasation of contrast. Stomach is moderately distended with fluid as is the duodenum and proximal jejunum. Gradual decreased caliber of small bowel. Mid to distal small bowel are decompressed. Lymphatic: No adenopathy Reproductive: Prior hysterectomy.  No adnexal masses. Other: Moderate free air in the abdomen and pelvis, presumably from recent surgery. Gas also seen within the subcutaneous soft tissues in the anterior abdominal wall. Trace free fluid in the cul-de-sac. Musculoskeletal: No acute bony abnormality. Severe chronic compression fracture at T12 with vertebral plana. IMPRESSION: VASCULAR No active contrast extravasation to suggest or localize active GI bleed. Aortic atherosclerosis. NON-VASCULAR Postoperative changes from left colectomy. Moderate free air in the abdomen and pelvis likely related to recent postoperative state. Mildly distended stomach and proximal small bowel with gradual decompression. This favors ileus although a degree of small-bowel obstruction cannot be excluded. Fluid-filled distal esophagus with moderate-sized hiatal hernia. Distal esophagus appears thick walled suggesting esophagitis. Electronically Signed   By: Rolm Baptise M.D.   On: 08/03/2022 00:37    Assessment and Plan Kristin Miller is a 87 y.o. Caucasian female with medical history significant for essential hypertension, osteoarthritis, breast cancer status post lumpectomy and axillary lymph node dissection, as well as colon cancer status post partial colectomy on 07/29/2021 by  Dr. Dahlia Byes, who presented to the emergency room with acute onset of upper abdominal pain as well as rectal bleeding.  She has been having normal output from her colostomy without any blood or melena.    CT angiography for GI bleeding revealed the following: Postoperative changes from left colectomy. Moderate free air in the abdomen and pelvis likely related to recent postoperative state.  Mildly distended stomach and proximal small bowel with gradual decompression. This favors ileus although a degree of small-bowel obstruction cannot be excluded.  Fluid-filled distal esophagus with moderate-sized hiatal hernia. Distal esophagus appears thick walled suggesting esophagitis.   GI bleeding--resolved -  patient is status post partial colectomy for colon cancer. --?some old blood from surgery --hgb stable.  Appreciate gen surgery input--ok to d/c NG and now on FLD   Partial SBO (small bowel obstruction) (HCC) Reflux esophagitis and HH on CT abd - d/c IVF --IV PPI BID --resume po home meds  Essential hypertension - IV prn hydralazine  Adenocarcinoma Colon Iron def anemia --s/p Open Low anterior resection with end colostomy on Jan 9th 2024 Abdominal wall resection to include psoas and left transversus abdominus muscles measuring 10x10 cm segement Left Oophorectomy --pt follows with dr Tasia Catchings -- start po ferrous sulfate. Consider IV iron at cancer center--will defer to Dr Tasia Catchings  Procedures:none Family communication : husband at bedside Consults :  general surgery CODE STATUS: DNR per pt DVT Prophylaxis :SCD Level of care: Telemetry Medical Status is: Inpatient Remains inpatient appropriate because: SBO. If remains stable d/c home tomorrow Ambulate in the halls    [place] THIS PATIENT: 35 minutes.  >50% time spent on counselling and coordination of care  Note: This dictation was prepared with Dragon dictation along with smaller phrase technology. Any transcriptional  errors that result from this process are unintentional.  Fritzi Mandes M.D    [facility]   CC: Primary care physician; Lorna Few, DO

## 2022-08-04 NOTE — Progress Notes (Signed)
[facility] Day(s): 1.   Interval History:  Patient seen and examined No acute events or new complaints overnight.  Patient reports she is feeling much better this morning No fever, chills, nausea, emesis, abdominal pain No new labs this morning No new imaging this morning NGT in place; clamped She does have 150 ccs recorded from ostomy; there is stool and gas present Currently on CLD  Vital signs in last 24 hours: [min-max] current  Temp:  [97.9 F (36.6 C)-98.8 F (37.1 C)] 98.2 F (36.8 C) (01/15 0450) Pulse Rate:  [87-104] 89 (01/15 0450) Resp:  [11-27] 20 (01/15 0450) BP: (90-136)/(47-77) 136/56 (01/15 0450) SpO2:  [95 %-100 %] 95 % (01/15 0450)     Height: 5' (152.4 cm) Weight: 56.7 kg BMI (Calculated): 24.41   Intake/Output last 2 shifts:  01/14 0701 - 01/15 0700 In: 414.6 [I.V.:414.6] Out: 150 [Stool:150]   Physical Exam:  Constitutional: alert, cooperative and no distress  Respiratory: breathing non-labored at rest  Cardiovascular: regular rate and sinus rhythm  Gastrointestinal: soft, non-tender, and non-distended. End colostomy in the left abdomen; gas and stool in bag Integumentary: Laparotomy is CDI with staples; no erythema or drainage  Labs:     Latest Ref Rng & Units 08/03/2022    7:41 PM 08/03/2022    8:43 AM 08/03/2022    5:40 AM  CBC  WBC 4.0 - 10.5 K/uL   21.1   Hemoglobin 12.0 - 15.0 g/dL 8.3  8.8  9.8   Hematocrit 36.0 - 46.0 % 26.8  28.4  31.8   Platelets 150 - 400 K/uL   636       Latest Ref Rng & Units 08/03/2022    5:40 AM 08/02/2022   11:39 PM 08/01/2022    3:40 AM  CMP  Glucose 70 - 99 mg/dL 124  116  104   BUN 8 - 23 mg/dL '11  15  13   '$ Creatinine 0.44 - 1.00 mg/dL 0.58  0.58  0.79   Sodium 135 - 145 mmol/L 134  134  131   Potassium 3.5 - 5.1 mmol/L 3.6  3.6  3.7   Chloride 98 - 111 mmol/L 104  104  107   CO2 22 - 32 mmol/L '20  22  19   '$ Calcium 8.9 - 10.3 mg/dL 7.8  8.1  7.6   Total  Protein 6.5 - 8.1 g/dL  5.6    Total Bilirubin 0.3 - 1.2 mg/dL  0.7    Alkaline Phos 38 - 126 U/L  77    AST 15 - 41 U/L  27    ALT 0 - 44 U/L  18       Imaging studies: No new pertinent imaging studies   Assessment/Plan:  87 y.o. female with now resolved post-operative ileus 6 days s/p open LAR with end colostomy, abdominal wall resection to include psoas and left transversus abdominus muscles (10x10 cm), and left oophorectomy for advanced descending colon CA.    - Advance to FLD; ADAT this evening   - NGT removed   - Okay to DC IVF   - Monitor abdominal examination; on-going bowel function - Pain control prn; antiemetics prn  - Mobilize - Further management per primary service; we will follow   - Discharge Planning: Improved, diet advancing. Needs to mobilize. Anticipate DC in next 24-48 hours   All of the above findings and recommendations were discussed with the patient, patient's family (husband at bedside),  and the medical team, and all of patient's and family's questions were answered to their expressed satisfaction.  -- Edison Simon, PA-C [facility] 08/04/2022, 7:21 AM M-F: 7am - 4pm

## 2022-08-05 DIAGNOSIS — I10 Essential (primary) hypertension: Secondary | ICD-10-CM | POA: Diagnosis not present

## 2022-08-05 DIAGNOSIS — K56609 Unspecified intestinal obstruction, unspecified as to partial versus complete obstruction: Secondary | ICD-10-CM | POA: Diagnosis not present

## 2022-08-05 DIAGNOSIS — K2100 Gastro-esophageal reflux disease with esophagitis, without bleeding: Secondary | ICD-10-CM

## 2022-08-05 DIAGNOSIS — K625 Hemorrhage of anus and rectum: Secondary | ICD-10-CM | POA: Diagnosis not present

## 2022-08-05 MED ORDER — FERROUS FUMARATE 324 (106 FE) MG PO TABS: 1.0000 | ORAL_TABLET | Freq: Two times a day (BID) | ORAL | 1 refills | Status: DC

## 2022-08-05 MED ORDER — PANTOPRAZOLE SODIUM 40 MG PO TBEC: 40.0000 mg | DELAYED_RELEASE_TABLET | Freq: Every day | ORAL | Status: DC

## 2022-08-05 MED ORDER — PANTOPRAZOLE SODIUM 40 MG PO TBEC: 40.0000 mg | DELAYED_RELEASE_TABLET | Freq: Two times a day (BID) | ORAL | 2 refills | Status: DC

## 2022-08-05 NOTE — Discharge Summary (Signed)
Physician Discharge Summary   Patient: Kristin Miller MRN: [id number] DOB: 05/25/1936  Admit date:     08/02/2022  Discharge date: 08/05/22  Discharge Physician: Fritzi Mandes   PCP: Lorna Few, DO   Recommendations at discharge:   F/u Zach Schultz/Dr Pabon--gen surgery on 08/12/22 F/u Dr Tasia Catchings Oncology on your scheduled appt F/u PCP in 1-2 weeks  Discharge Diagnoses: Principal Problem:   GI bleeding Active Problems:   SBO (small bowel obstruction) (HCC)   Essential hypertension   Iron deficiency anemia   Rectal bleeding   Esophagitis   Assessment and Plan PRISILA Miller is a 87 y.o. Caucasian female with medical history significant for essential hypertension, osteoarthritis, breast cancer status post lumpectomy and axillary lymph node dissection, as well as colon cancer status post partial colectomy on 07/29/2021 by Dr. Dahlia Byes, who presented to the emergency room with acute onset of upper abdominal pain as well as rectal bleeding.  She has been having normal output from her colostomy without any blood or melena.    CT angiography for GI bleeding revealed the following: Postoperative changes from left colectomy. Moderate free air in the abdomen and pelvis likely related to recent postoperative state.  Mildly distended stomach and proximal small bowel with gradual decompression. This favors ileus although a degree of small-bowel obstruction cannot be excluded.  Fluid-filled distal esophagus with moderate-sized hiatal hernia. Distal esophagus appears thick walled suggesting esophagitis.     GI bleeding--resolved -  patient is status post partial colectomy for colon cancer. --?some old blood from surgery --hgb stable.  Appreciate gen surgery input--tolerating soft diet   Partial SBO (small bowel obstruction) (HCC) Reflux esophagitis and HH on CT abd - d/c IVF --IV PPI BID--change to po PPI --resume po home meds --good ostomy output   Essential hypertension - IV prn  hydralazine --resumed home meds   Adenocarcinoma Colon Iron def anemia s/p Open Low anterior resection with end colostomy on Jan 9th 2024 Abdominal wall resection to include psoas and left transversus abdominus muscles measuring 10x10 cm segement Left Oophorectomy --pt follows with dr Tasia Catchings -- start po ferrous sulfate. Pt to get IV iron at cancer center-- pt has scheduled appts   Procedures:none Family communication : husband at bedside Consults : general surgery CODE STATUS: DNR per pt DVT Prophylaxis :SCD  D/c home--ok per surgery. Pt and husband agreeable     Pain control - [place] Controlled Substance Reporting System database was reviewed. and patient was instructed, not to drive, operate heavy machinery, perform activities at heights, swimming or participation in water activities or provide baby-sitting services while on Pain, Sleep and Anxiety Medications; until their outpatient Physician has advised to do so again. Also recommended to not to take more than prescribed Pain, Sleep and Anxiety Medications.  Consultants: gen surgery Dr Dahlia Byes Procedures performed: none  Disposition: Home Diet recommendation:  Discharge Diet Orders (From admission, onward)     Start     Ordered   08/05/22 0000  Diet - low sodium heart healthy        08/05/22 0824           Cardiac diet DISCHARGE MEDICATION: Allergies as of 08/05/2022       Reactions   Combigan [brimonidine Tartrate-timolol]    Redness, burning, irritation   Lisinopril Cough   Other Rash   SUTURES-(PARTIAL HYSTERECTOMY)        Medication List     STOP taking these medications    ibuprofen 600 MG  tablet Commonly known as: ADVIL   potassium chloride SA 20 MEQ tablet Commonly known as: KLOR-CON M       TAKE these medications    acetaminophen 500 MG tablet Commonly known as: TYLENOL Take 500 mg by mouth every 6 (six) hours as needed for pain.   Ferrous Fumarate 324 (106 Fe) MG Tabs  tablet Commonly known as: HEMOCYTE - 106 mg FE Take 1 tablet (106 mg of iron total) by mouth 2 (two) times daily.   latanoprost 0.005 % ophthalmic solution Commonly known as: XALATAN 1 drop at bedtime. Eye doctor   losartan-hydrochlorothiazide 100-12.5 MG tablet Commonly known as: HYZAAR Take 1 tablet by mouth daily.   methocarbamol 500 MG tablet Commonly known as: ROBAXIN Take 1 tablet (500 mg total) by mouth every 8 (eight) hours as needed for muscle spasms.   ondansetron 4 MG disintegrating tablet Commonly known as: ZOFRAN-ODT Take 1 tablet (4 mg total) by mouth every 6 (six) hours as needed for nausea.   oxyCODONE 5 MG immediate release tablet Commonly known as: Oxy IR/ROXICODONE Take 1 tablet (5 mg total) by mouth every 6 (six) hours as needed for severe pain or breakthrough pain.   pantoprazole 40 MG tablet Commonly known as: PROTONIX Take 1 tablet (40 mg total) by mouth 2 (two) times daily before a meal.   promethazine-dextromethorphan 6.25-15 MG/5ML syrup Commonly known as: PROMETHAZINE-DM Take 2.5 mLs by mouth 4 (four) times daily as needed.        Follow-up Information     Tylene Fantasia, PA-C. Go on 08/12/2022.   Specialty: Physician Assistant Why: Go to appointment on 01/23 at 130 PM Contact information: [address] [place] [address] [phone]         Lorna Few, [place]. Schedule an appointment as soon as possible for a visit in 1 week(s).   Specialty: Family Medicine Contact information: [address] [place] [address] [phone]         Earlie Server, MD. Daphane Shepherd on 08/12/2022.   Specialty: Oncology Why: on your scheduled appts Contact information: [place] [address] [phone]                 Filed Weights   08/02/22 2237  Weight: 56.7 kg     Condition at discharge: fair  The results of significant diagnostics from this hospitalization (including imaging,  microbiology, ancillary and laboratory) are listed below for reference.   Imaging Studies: DG Abdomen 1 View  Result Date: 08/03/2022 CLINICAL DATA:  87 year old female status post nasogastric tube placement. EXAM: ABDOMEN - 1 VIEW COMPARISON:  No priors. FINDINGS: Nasogastric tube in position, with tip projecting over the lower left hemithorax. The tube appears coiled, with tip likely in the stomach, however, this appears to be within the portion of the stomach that is intrathoracic (the patient had a moderate to large hiatal hernia on prior CT examination). Visualized bowel-gas pattern is unremarkable. Skin staples are evident in the midline of the mid abdomen. IMPRESSION: 1. Poorly positioned nasogastric tube which appears coiled in the proximal stomach, likely with tip in a hiatal hernia. Advancement of the nasogastric tube is suggested for more optimal positioning. Electronically Signed   By: Vinnie Langton M.D.   On: 08/03/2022 06:19   CT ANGIO GI BLEED  Result Date: 08/03/2022 CLINICAL DATA:  Lower GI bleed, rectal bleeding. Status post colectomy with ostomy. EXAM: CTA ABDOMEN AND PELVIS WITHOUT AND WITH CONTRAST TECHNIQUE: Multidetector  CT imaging of the abdomen and pelvis was performed using the standard protocol during bolus administration of intravenous contrast. Multiplanar reconstructed images and MIPs were obtained and reviewed to evaluate the vascular anatomy. RADIATION DOSE REDUCTION: This exam was performed according to the departmental dose-optimization program which includes automated exposure control, adjustment of the mA and/or kV according to patient size and/or use of iterative reconstruction technique. CONTRAST:  128m OMNIPAQUE IOHEXOL 350 MG/ML SOLN COMPARISON:  07/03/2022 FINDINGS: VASCULAR Aorta: Atherosclerotic calcifications and irregularity. No aneurysm or dissection. Celiac: Mild-to-moderate narrowing proximally, patent. No aneurysm or dissection. SMA: Widely patent Renals:  Widely patent IMA: Patent. Inflow: Atherosclerotic calcifications.  No aneurysm or dissection. Proximal Outflow: No aneurysm or dissection. Veins: No obvious venous abnormality within the limitations of this arterial phase study. No focal hepatic abnormality. Gallbladder unremarkable. Review of the MIP images confirms the above findings. NON-VASCULAR Lower chest: Trace bilateral effusions. Bibasilar atelectasis. Moderate to large hiatal hernia. Distal esophagus appears thick walled suggesting esophagitis. Fluid noted within the distal esophagus which could be related to reflux or dysmotility. Hepatobiliary: No focal hepatic abnormality. Gallbladder unremarkable. Pancreas: No focal abnormality or ductal dilatation. Spleen: No focal abnormality.  Normal size. Adrenals/Urinary Tract: No adrenal abnormality. No focal renal abnormality. No stones or hydronephrosis. Urinary bladder is unremarkable. Stomach/Bowel: Changes of partial colectomy with Hartmann's pouch and left lower quadrant ostomy. No active extravasation of contrast. Stomach is moderately distended with fluid as is the duodenum and proximal jejunum. Gradual decreased caliber of small bowel. Mid to distal small bowel are decompressed. Lymphatic: No adenopathy Reproductive: Prior hysterectomy.  No adnexal masses. Other: Moderate free air in the abdomen and pelvis, presumably from recent surgery. Gas also seen within the subcutaneous soft tissues in the anterior abdominal wall. Trace free fluid in the cul-de-sac. Musculoskeletal: No acute bony abnormality. Severe chronic compression fracture at T12 with vertebral plana. IMPRESSION: VASCULAR No active contrast extravasation to suggest or localize active GI bleed. Aortic atherosclerosis. NON-VASCULAR Postoperative changes from left colectomy. Moderate free air in the abdomen and pelvis likely related to recent postoperative state. Mildly distended stomach and proximal small bowel with gradual decompression. This  favors ileus although a degree of small-bowel obstruction cannot be excluded. Fluid-filled distal esophagus with moderate-sized hiatal hernia. Distal esophagus appears thick walled suggesting esophagitis. Electronically Signed   By: KRolm BaptiseM.D.   On: 08/03/2022 00:37   MR Abdomen W Wo Contrast  Result Date: 07/13/2022 CLINICAL DATA:  An 87year old female presents for evaluation of adrenal and hepatic lesions. EXAM: MRI ABDOMEN WITHOUT AND WITH CONTRAST TECHNIQUE: Multiplanar multisequence MR imaging of the abdomen was performed both before and after the administration of intravenous contrast. CONTRAST:  655mGADAVIST GADOBUTROL 1 MMOL/ML IV SOLN COMPARISON:  July 03, 2022 FINDINGS: Lower chest: No effusion or consolidative process. Hepatobiliary: Cysts in the liver largest in the dome of the RIGHT hemiliver corresponding to "too small to characterize" lesions on previous imaging. Largest measuring approximately 11 mm on the current MRI. No focal, suspicious hepatic lesion. No pericholecystic stranding or biliary duct dilation. Pancreas: Mild pancreatic atrophy. No ductal dilation or sign of inflammation. Cystic lesion in the tail of pancreas without concerning features at this time 5-6 mm (image 19/4) intrinsic T1 signal of the pancreas is largely preserved. Spleen:  Normal. Adrenals/Urinary Tract: LEFT adrenal lesion well-circumscribed and displaying loss of signal from out of phase as compared to in phase T1 weighted gradient echo imaging (image 45/15) nearly 50% signal loss in measuring 11 mm. RIGHT  adrenal gland is normal. No suspicious renal lesion or hydronephrosis with renal cortical scarring which is greatest on the LEFT and is mild. Stomach/Bowel: Locally advanced colonic neoplasm involving musculature in the LEFT lower quadrant including LEFT iliac and oblique insertion sites along the LEFT iliac fossa better demonstrated on recent CT imaging of the chest, abdomen and pelvis. Large hiatal  hernia, nearly the entire stomach is herniated into the chest. Vascular/Lymphatic: Portal vein is patent. Hepatic veins are patent. Normal caliber of the abdominal aorta. No adenopathy in the retroperitoneum or upper abdomen. Other:  No ascites Musculoskeletal: Chronic compression fracture in the lower thoracic spine is unchanged and associated with near complete loss of height and kyphotic angulation, better evaluated also on recent CT imaging. IMPRESSION: 1. 11 mm LEFT adrenal lesion is most consistent with an adenoma. Based on current clinical literature, biochemical evaluation to exclude possible functioning adrenal nodule is suggested if not already performed. Please refer to current clinical guidelines for detailed recommendations. NEJM 1610:960 154-51 2. Locally advanced colonic neoplasm involving musculature in the LEFT lower quadrant including LEFT iliac and oblique insertion sites along the LEFT iliac fossa better demonstrated on recent CT imaging of the chest, abdomen and pelvis. 3. Hepatic cysts. 4. Large hiatal hernia, nearly the entire stomach is herniated into the chest. Electronically Signed   By: Zetta Bills M.D.   On: 07/13/2022 14:50    Microbiology: Results for orders placed or performed during the hospital encounter of 07/19/22  SARS Coronavirus 2 by RT PCR (hospital order, performed in [facility] hospital lab) *cepheid single result test* Anterior Nasal Swab     Status: Abnormal   Collection Time: 07/19/22  9:56 AM   Specimen: Anterior Nasal Swab  Result Value Ref Range Status   SARS Coronavirus 2 by RT PCR POSITIVE (A) NEGATIVE Final    Comment: (NOTE) SARS-CoV-2 target nucleic acids are DETECTED  SARS-CoV-2 RNA is generally detectable in upper respiratory specimens  during the acute phase of infection.  Positive results are indicative  of the presence of the identified virus, but do not rule out bacterial infection or co-infection with other pathogens not detected by the  test.  Clinical correlation with patient history and  other diagnostic information is necessary to determine patient infection status.  The expected result is negative.  Fact Sheet for Patients:   https://www.Ameliah Baskins.info/   Fact Sheet for Healthcare Providers:   https://hall.com/    This test is not yet approved or cleared by the [place] FDA and  has been authorized for detection and/or diagnosis of SARS-CoV-2 by FDA under an Emergency Use Authorization (EUA).  This EUA will remain in effect (meaning this test can be used) for the duration of  the COVID-19 declaration under Section 564(b)(1)  of the Act, 21 U.S.C. section 360-bbb-3(b)(1), unless the authorization is terminated or revoked sooner.   Performed at [facility], [address] [place], [place] [address]     Labs: CBC: Recent Labs  Lab 07/31/22 0335 08/01/22 0340 08/02/22 2339 08/03/22 0232 08/03/22 0540 08/03/22 0843 08/03/22 1941 08/04/22 0811  WBC 16.6* 21.9* 21.3*  --  21.1*  --   --  11.9*  NEUTROABS  --   --  18.0*  --   --   --   --   --   HGB 8.0* 9.0* 9.5* 9.5* 9.8* 8.8* 8.3* 8.2*  HCT 25.6* 28.6* 30.6* 30.7* 31.8* 28.4* 26.8* 26.6*  MCV 74.9* 75.3* 76.9*  --  77.2*  --   --  77.3*  PLT 375 451* 617*  --  636*  --   --  269*   Basic Metabolic Panel: Recent Labs  Lab 07/30/22 0348 07/31/22 0335 08/01/22 0340 08/02/22 2339 08/03/22 0540 08/04/22 0811  NA 133* 129* 131* 134* 134* 135  K 3.2* 3.3* 3.7 3.6 3.6 3.5  CL 104 102 107 104 104 105  CO2 19* 21* 19* 22 20* 23  GLUCOSE 150* 142* 104* 116* 124* 84  BUN '14 15 13 15 11 '$ 7*  CREATININE 1.07* 1.01* 0.79 0.58 0.58 0.58  CALCIUM 7.9* 7.1* 7.6* 8.1* 7.8* 7.6*  MG 1.7 1.5* 1.9  --   --   --   PHOS 4.1  --   --   --   --   --    Liver Function Tests: Recent Labs  Lab 07/30/22 0348 08/02/22 2339  AST 21 27  ALT 11 18  ALKPHOS 38 77  BILITOT 0.4 0.7  PROT 5.3* 5.6*  ALBUMIN  2.7* 2.3*   CBG: No results for input(s): "GLUCAP" in the last 168 hours.  Discharge time spent: greater than 30 minutes.  Signed: Fritzi Mandes, MD [facility] 08/05/2022

## 2022-08-05 NOTE — TOC Initial Note (Signed)
Transition of Care [facility]) - Initial/Assessment Note    Patient Details  Name: Kristin Miller MRN: [id number] Date of Birth: [birth year]  [facility]) CM/SW Contact:    Beverly Sessions, RN Phone Number: 08/05/2022, 8:58 AM  Clinical Narrative:                  Transition of Care [facility]) Screening Note   Patient Details  Name: Kristin Miller Date of Birth: [birth year]   Transition of Care [facility]) CM/SW Contact:    Beverly Sessions, RN Phone Number: 08/05/2022, 8:58 AM    Transition of Care Department [facility]) has reviewed patient and no TOC needs have been identified at this time. We will continue to monitor patient advancement through interdisciplinary progression rounds. If new patient transition needs arise, please place a TOC consult.   Confirmed with MD no TOC needs for discharge         Patient Goals and CMS Choice            Expected Discharge Plan and Services         Expected Discharge Date: 08/05/22                                    Prior Living Arrangements/Services                       Activities of Daily Living Home Assistive Devices/Equipment: None ADL Screening (condition at time of admission) Patient's cognitive ability adequate to safely complete daily activities?: Yes Is the patient deaf or have difficulty hearing?: No Does the patient have difficulty seeing, even when wearing glasses/contacts?: No Does the patient have difficulty concentrating, remembering, or making decisions?: No Patient able to express need for assistance with ADLs?: Yes Does the patient have difficulty dressing or bathing?: No Independently performs ADLs?: Yes (appropriate for developmental age) Does the patient have difficulty walking or climbing stairs?: No Weakness of Legs: None Weakness of Arms/Hands: Both  Permission Sought/Granted                  Emotional Assessment              Admission diagnosis:  GI bleeding  [K92.2] Rectal bleeding [K62.5] SBO (small bowel obstruction) ([place]) [K56.609] Patient Active Problem List   Diagnosis Date Noted   Rectal bleeding 08/04/2022   Gastroesophageal reflux disease with esophagitis 08/04/2022   GI bleeding 08/03/2022   SBO (small bowel obstruction) ([place]) 08/03/2022   Essential hypertension 08/03/2022   S/P partial resection of colon 07/29/2022   Malnutrition ([place]) 07/05/2022   Goals of care, counseling/discussion 07/05/2022   Malignant neoplasm of colon ([place]) 07/04/2022   Iron deficiency anemia 06/27/2022   Intestines neoplasm 06/27/2022   Primary open angle glaucoma (POAG) of both eyes, moderate stage 05/14/2022   Contusion of thigh 08/02/2018   BP (high blood pressure) 11/15/2015   OP (osteoporosis) 11/15/2015   Primary cancer of lower-outer quadrant of right breast ([place]) 06/20/2015   Low back pain 08/02/2014   HTN (hypertension) 08/02/2014   Basal cell carcinoma in situ of skin of left shoulder 07/31/2014   Microcalcifications of the breast 11/28/2013   Abnormal finding on mammography 10/03/2013   Skin cancer 10/03/2013   Mammographic microcalcification 05/04/2013   Chronic bilateral low back pain without sciatica 03/29/2012   PCP:  Lorna Few, DO Pharmacy:   [facility]  [address], [place] [address] Phone: [phone] Fax: [phone]     Social Determinants of Health (SDOH) Social History: SDOH Screenings   Food Insecurity: No Food Insecurity (08/04/2022)  Housing: Low Risk  (08/04/2022)  Transportation Needs: No Transportation Needs (08/04/2022)  Utilities: Not At Risk (08/04/2022)  Alcohol Screen: Low Risk  (09/25/2021)  Depression (PHQ2-9): Low Risk  (01/30/2022)  Financial Resource Strain: Low Risk  (09/25/2021)  Physical Activity: Inactive (09/25/2021)  Social Connections: Moderately Integrated (09/25/2021)  Stress: No Stress Concern Present (09/25/2021)  Tobacco Use: Low Risk   (08/04/2022)   SDOH Interventions:     Readmission Risk Interventions     No data to display

## 2022-08-05 NOTE — Progress Notes (Signed)
[facility] Day(s): 2.   Interval History:  Patient seen and examined No acute events or new complaints overnight.  Patient reports she is doing well No fever, chills, nausea, emesis, abdominal pain No new labs this morning She continues to have ostomy function Diet advanced without issues  Vital signs in last 24 hours: [min-max] current  Temp:  [97.6 F (36.4 C)-99 F (37.2 C)] 98.8 F (37.1 C) (01/16 0518) Pulse Rate:  [80-95] 80 (01/16 0518) Resp:  [18-20] 20 (01/16 0518) BP: (116-136)/(55-69) 136/69 (01/16 0518) SpO2:  [93 %-95 %] 94 % (01/16 0518)     Height: 5' (152.4 cm) Weight: 56.7 kg BMI (Calculated): 24.41   Intake/Output last 2 shifts:  01/15 0701 - 01/16 0700 In: 100 [P.O.:100] Out: -    Physical Exam:  Constitutional: alert, cooperative and no distress  Respiratory: breathing non-labored at rest  Cardiovascular: regular rate and sinus rhythm  Gastrointestinal: soft, non-tender, and non-distended. End colostomy in the left abdomen; gas and stool in bag Integumentary: Laparotomy is CDI with staples; no erythema or drainage  Labs:     Latest Ref Rng & Units 08/04/2022    8:11 AM 08/03/2022    7:41 PM 08/03/2022    8:43 AM  CBC  WBC 4.0 - 10.5 K/uL 11.9     Hemoglobin 12.0 - 15.0 g/dL 8.2  8.3  8.8   Hematocrit 36.0 - 46.0 % 26.6  26.8  28.4   Platelets 150 - 400 K/uL 472         Latest Ref Rng & Units 08/04/2022    8:11 AM 08/03/2022    5:40 AM 08/02/2022   11:39 PM  CMP  Glucose 70 - 99 mg/dL 84  124  116   BUN 8 - 23 mg/dL '7  11  15   '$ Creatinine 0.44 - 1.00 mg/dL 0.58  0.58  0.58   Sodium 135 - 145 mmol/L 135  134  134   Potassium 3.5 - 5.1 mmol/L 3.5  3.6  3.6   Chloride 98 - 111 mmol/L 105  104  104   CO2 22 - 32 mmol/L '23  20  22   '$ Calcium 8.9 - 10.3 mg/dL 7.6  7.8  8.1   Total Protein 6.5 - 8.1 g/dL   5.6   Total Bilirubin 0.3 - 1.2 mg/dL   0.7   Alkaline Phos 38 - 126 U/L   77   AST 15 - 41  U/L   27   ALT 0 - 44 U/L   18      Imaging studies: No new pertinent imaging studies   Assessment/Plan:  87 y.o. female with now resolved post-operative ileus 6 days s/p open LAR with end colostomy, abdominal wall resection to include psoas and left transversus abdominus muscles (10x10 cm), and left oophorectomy for advanced descending colon CA.    - Continue soft/regular diet   - Monitor abdominal examination; on-going bowel function - Pain control prn; antiemetics prn  - Mobilize; worked with mobility specialist 1/15 - Further management per primary service   - Discharge Planning: Okay for discharge from surgical perspective. She has follow up with me on 01/23 for staple removal. Oncologic follow up on 01/23 as well.   All of the above findings and recommendations were discussed with the patient, patient's family (husband at bedside), and the medical team, and all of patient's and family's questions were answered to their expressed satisfaction.  -- [name]  Kristin Miller [facility] 08/05/2022, 6:58 AM M-F: 7am - 4pm

## 2022-08-07 ENCOUNTER — Inpatient Hospital Stay: Payer: Medicare HMO

## 2022-08-07 VITALS — BP 98/68 | HR 80 | Temp 98.2°F | Resp 16

## 2022-08-07 DIAGNOSIS — C186 Malignant neoplasm of descending colon: Secondary | ICD-10-CM | POA: Diagnosis not present

## 2022-08-07 DIAGNOSIS — D500 Iron deficiency anemia secondary to blood loss (chronic): Secondary | ICD-10-CM

## 2022-08-07 DIAGNOSIS — C189 Malignant neoplasm of colon, unspecified: Secondary | ICD-10-CM

## 2022-08-07 MED ORDER — SODIUM CHLORIDE 0.9 % IV SOLN
200.0000 mg | Freq: Once | INTRAVENOUS | Status: AC
  Administered 2022-08-07: 200 mg via INTRAVENOUS
  Filled 2022-08-07: qty 200

## 2022-08-07 MED ORDER — SODIUM CHLORIDE 0.9 % IV SOLN
Freq: Once | INTRAVENOUS | Status: AC
  Filled 2022-08-07: qty 250

## 2022-08-08 LAB — SURGICAL PATHOLOGY

## 2022-08-11 ENCOUNTER — Other Ambulatory Visit: Payer: Self-pay

## 2022-08-11 DIAGNOSIS — C189 Malignant neoplasm of colon, unspecified: Secondary | ICD-10-CM

## 2022-08-11 MED FILL — Iron Sucrose Inj 20 MG/ML (Fe Equiv): INTRAVENOUS | Qty: 10 | Status: AC

## 2022-08-12 ENCOUNTER — Ambulatory Visit (INDEPENDENT_AMBULATORY_CARE_PROVIDER_SITE_OTHER): Payer: Medicare HMO | Admitting: Physician Assistant

## 2022-08-12 ENCOUNTER — Ambulatory Visit (HOSPITAL_COMMUNITY)
Admission: RE | Admit: 2022-08-12 | Discharge: 2022-08-12 | Disposition: A | Discharge status: Home / Self Care | Payer: Medicare HMO | Source: Ambulatory Visit | Attending: Nurse Practitioner | Admitting: Nurse Practitioner

## 2022-08-12 ENCOUNTER — Inpatient Hospital Stay: Payer: Medicare HMO | Admitting: Oncology

## 2022-08-12 ENCOUNTER — Encounter: Payer: Self-pay | Admitting: Oncology

## 2022-08-12 ENCOUNTER — Encounter: Payer: Self-pay | Admitting: Physician Assistant

## 2022-08-12 VITALS — BP 126/63 | HR 88 | Temp 97.8°F | Resp 18 | Wt 122.9 lb

## 2022-08-12 VITALS — BP 159/87 | HR 87 | Temp 98.0°F | Ht 60.0 in | Wt 121.0 lb

## 2022-08-12 DIAGNOSIS — R531 Weakness: Secondary | ICD-10-CM | POA: Diagnosis not present

## 2022-08-12 DIAGNOSIS — Z08 Encounter for follow-up examination after completed treatment for malignant neoplasm: Secondary | ICD-10-CM

## 2022-08-12 DIAGNOSIS — C186 Malignant neoplasm of descending colon: Secondary | ICD-10-CM

## 2022-08-12 DIAGNOSIS — D500 Iron deficiency anemia secondary to blood loss (chronic): Secondary | ICD-10-CM | POA: Diagnosis not present

## 2022-08-12 DIAGNOSIS — K5909 Other constipation: Secondary | ICD-10-CM

## 2022-08-12 DIAGNOSIS — Z09 Encounter for follow-up examination after completed treatment for conditions other than malignant neoplasm: Secondary | ICD-10-CM

## 2022-08-12 DIAGNOSIS — Z933 Colostomy status: Secondary | ICD-10-CM | POA: Insufficient documentation

## 2022-08-12 DIAGNOSIS — K9400 Colostomy complication, unspecified: Secondary | ICD-10-CM | POA: Diagnosis not present

## 2022-08-12 DIAGNOSIS — C50511 Malignant neoplasm of lower-outer quadrant of right female breast: Secondary | ICD-10-CM | POA: Diagnosis not present

## 2022-08-12 NOTE — Discharge Instructions (Addendum)
Send home with pouches Set up with [facility] Ask Dr Shana Chute: Begin Colace or Miralax due to low stool output?  When to start Iron pills, are they needed (had infusion)  HH has not started for PT, nursing one time

## 2022-08-12 NOTE — Assessment & Plan Note (Addendum)
4 millimeter, T1a, N0; ER 90%, PR 0%, HER-2/neu negative. Declined radiation therapy.  She finished 6 years of Letrozole, managed by Dr. Bary Castilla.

## 2022-08-12 NOTE — Assessment & Plan Note (Addendum)
Pathology results are reviewed and discussed with patient. 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  Benefit of adjuvant 5-FU monotherapy in stage II dMMR patients  is uncertain. With her age, I most likely will not offer chemotherapy. I will check [place]a ctDNA I recommend genetic testing - refer to genetic conselor Small lung nodules, too small to characterized- attention on follow up CT  T4b disease, refer to Radonc for evaluation of radiation.

## 2022-08-12 NOTE — Progress Notes (Signed)
[facility]   Reason for visit:  LLQ colostomy  spouse assisting with pouch change. HH has made one visit.  Unclear if they are providing supplies or not.  HPI:   Past Medical History:  Diagnosis Date  . Allergy   . Anemia    H/O AS A CHILD  . Arthritis   . Back pain   . Breast cancer of upper-outer quadrant of right female breast ([place]) 06/12/2015   4 millimeter, T1a, N0; ER 90%, PR 0%, HER-2/neu not overexpressed.  Declined radiation therapy.  . Collar bone fracture 05/28/2013   Fractured Collar bone on left  . Dyspnea   . Dysrhythmia    h/o heart skipping beat years ago  . Frequent headaches    h/o migraines  . Hypertension   . Osteopenia 04/2018   Bone density without significant interval change since 2017.  Marland Kitchen Pneumonia    Family History  Problem Relation Age of Onset  . Mental illness Mother        Dementia  . Stroke Father   . Cancer Brother        Lung Cancer  . Breast cancer Other    Allergies  Allergen Reactions  . Combigan [Brimonidine Tartrate-Timolol]     Redness, burning, irritation   . Lisinopril Cough  . Other Rash    SUTURES-(PARTIAL HYSTERECTOMY)   Current Outpatient Medications  Medication Sig Dispense Refill Last Dose  . acetaminophen (TYLENOL) 500 MG tablet Take 500 mg by mouth every 6 (six) hours as needed for pain.     . Ferrous Fumarate (HEMOCYTE - 106 MG FE) 324 (106 Fe) MG TABS tablet Take 1 tablet (106 mg of iron total) by mouth 2 (two) times daily. (Patient not taking: Reported on 08/12/2022) 30 tablet 1   . latanoprost (XALATAN) 0.005 % ophthalmic solution 1 drop at bedtime. Eye doctor     . losartan-hydrochlorothiazide (HYZAAR) 100-12.5 MG tablet Take 1 tablet by mouth daily. 90 tablet 1   . methocarbamol (ROBAXIN) 500 MG tablet Take 1 tablet (500 mg total) by mouth every 8 (eight) hours as needed for muscle spasms. 15 tablet 0   . ondansetron (ZOFRAN-ODT) 4 MG disintegrating tablet Take 1 tablet (4 mg total) by mouth every  6 (six) hours as needed for nausea. (Patient not taking: Reported on 08/12/2022) 20 tablet 0   . oxyCODONE (OXY IR/ROXICODONE) 5 MG immediate release tablet Take 1 tablet (5 mg total) by mouth every 6 (six) hours as needed for severe pain or breakthrough pain. (Patient not taking: Reported on 08/12/2022) 20 tablet 0   . pantoprazole (PROTONIX) 40 MG tablet Take 1 tablet (40 mg total) by mouth 2 (two) times daily before a meal. 60 tablet 2   . promethazine-dextromethorphan (PROMETHAZINE-DM) 6.25-15 MG/5ML syrup Take 2.5 mLs by mouth 4 (four) times daily as needed. (Patient not taking: Reported on 08/12/2022) 118 mL 0    No current facility-administered medications for this encounter.   ROS  Review of Systems  Constitutional:  Positive for appetite change.       Decreased appetite  Gastrointestinal:        LLQ colostomy  Musculoskeletal:  Positive for gait problem.       LEft leg weak since surgery, she states.  Will mention on follow up appointment with surgeon. PT recommended with HH.   Skin:  Positive for color change.       Peristomal redness  Neurological:  Positive for numbness.  LEft leg- post op  All other systems reviewed and are negative. Vital signs:  BP (!) 142/59   Pulse 87   Temp 97.9 F (36.6 C)   Resp 18   Ht 5' (1.524 m)   Wt 56.7 kg   SpO2 98%   BMI 24.41 kg/m  Exam:  Physical Exam Vitals reviewed.  Constitutional:      Appearance: Normal appearance.  Abdominal:     Palpations: Abdomen is soft.  Musculoskeletal:     Comments: Left leg weakness  Skin:    General: Skin is warm and dry.     Findings: Erythema present.  Neurological:     Mental Status: She is alert and oriented to person, place, and time.  Psychiatric:        Mood and Affect: Mood normal.        Behavior: Behavior normal.    Stoma type/location:  LLQ colostomy Stomal assessment/size:  pink and moist, budded Peristomal assessment:  dip in abdominal plane, using barrier ring  Treatment  options for stomal/peristomal skin: barrier ring and 2 piece pouch   recommend filtered pouch Output: thick brown stool, recently in ED for small bowel obstruction Ostomy pouching: 2pc. Pouch 2 3/4" with barrier ring Education provided:  recommend patient participate in emptying and pouch changes.  Work with [facility] staff to learn.      Impression/dx  Colostomy Recent small bowel obstruction, resolved but is having low output x 1 day.  To see surgery team today and discuss at length.  Discussion  See above  Ask about iron pills they were given.  Recently received iron infusion as well.  Plan  See back in 2 weeks.     Visit time: 50 minutes.   Domenic Moras FNP-BC

## 2022-08-12 NOTE — Progress Notes (Signed)
Hematology/Oncology Consult Note [phone] [phone]) [phone]    CHIEF COMPLAINTS/PURPOSE OF CONSULTATION:  Descending colon cancer  ASSESSMENT & PLAN:   Malignant neoplasm of colon [facility]) Pathology results are reviewed and discussed with patient. 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  Benefit of adjuvant 5-FU monotherapy in stage II dMMR patients  is uncertain. With her age, I most likely will not offer chemotherapy. I will check ctDNA I recommend genetic testing - refer to genetic conselor Small lung nodules, too small to characterized- attention on follow up CT  T4b disease, refer to Radonc for evaluation of radiation.    Iron deficiency anemia Recommend additional IV venofer weekly x 3   Primary cancer of lower-outer quadrant of right breast (HCC) 4 millimeter, T1a, N0; ER 90%, PR 0%, HER-2/neu negative. Declined radiation therapy.  She finished 6 years of Letrozole, managed by Dr. Bary Castilla.   Orders Placed This Encounter  Procedures   CBC with Differential/Platelet    Standing Status:   Future    Standing Expiration Date:   08/13/2023   Iron and TIBC    Standing Status:   Future    Standing Expiration Date:   08/13/2023   Ferritin    Standing Status:   Future    Standing Expiration Date:   08/13/2023   Retic Panel    Standing Status:   Future    Standing Expiration Date:   08/13/2023   CBC with Differential/Platelet    Standing Status:   Future    Standing Expiration Date:   08/13/2023   Comprehensive metabolic panel    Standing Status:   Future    Standing Expiration Date:   08/12/2023   Iron and TIBC    Standing Status:   Future    Standing Expiration Date:   08/13/2023   Ferritin    Standing Status:   Future    Standing Expiration Date:   08/13/2023   CEA    Standing Status:   Future    Standing Expiration Date:   08/12/2023   Ambulatory referral to Genetics    Referral Priority:    Urgent    Referral Type:   Consultation    Referral Reason:   Specialty Services Required    Number of Visits Requested:   1   Follow up in 3 months.  All questions were answered. The patient knows to call the clinic with any problems, questions or concerns.  Earlie Server, MD, PhD [facility] Hematology Oncology 08/12/2022   HISTORY OF PRESENTING ILLNESS:  Kristin Miller 87 y.o. female presents to establish care for descending colon cancer I have reviewed her chart and materials related to her cancer extensively and collaborated history with the patient. Summary of oncologic history is as follows: Oncology History  Malignant neoplasm of colon ([place])  06/27/2022 Cancer Staging   Staging form: Colon and Rectum, AJCC 8th Edition - Clinical stage from 06/27/2022: Stage IIC (cT4b, cN0, cM0) - Signed by Earlie Server, MD on 07/05/2022 Stage prefix: Initial diagnosis Total positive nodes: 0   06/27/2022 Initial Diagnosis   Cancer of descending colon  -Patient was found to have iron deficiency anemia -06/27/2022 colonoscopy showed fungating partially obstructing large mass found in the descending colon.  The mass was circumferential, oozing was present. Biopsy showed invasive adenocarcinoma. -06/27/2022, EGD showed large hiatal hernia, esophagus plaque was found, suspicious for candidiasis.  Cells for cytology obtained.  Normal stomach  and examined duodenum. Esophagus pression showed squamous epithelial cells admixed with mixed inflammatory cells and numerous fungal organisms, morphologically consistent with Candida species.  -Dr. Allen Norris has prescribed a course of Diflucan.     07/03/2022 Imaging   CT chest abdomen pelvis with contrast 1. Large irregular annular locally advanced colonic mass centered in the descending colon measuring up to 8.7 x 6.9 x 8.8 cm, with invasion of the pericolonic fat and direct tumor invasion of portions of the left iliopsoas muscle at the level of the superior iliac crest  and direct invasion of the left lateral flank muscle wall. Findings are compatible with primary colonic malignancy. No bowel obstruction. 2. No lymphadenopathy or other definite findings of metastatic disease. 3. Two scattered subcentimeter hypodense liver lesions, too small to accurately characterize, indeterminate. Indeterminate left adrenal 1.3 cm nodule. Further evaluation with MRI abdomen without and with IV contrast may be obtained at this time, as clinically warranted. 4. Two scattered tiny solid left pulmonary nodules, largest 0.3 cm, indeterminate. Recommend attention on follow-up chest CT in 3 months. 5. Large hiatal hernia. Mildly patulous lower thoracic esophagus with small amount of layering oral contrast, suggesting esophageal dysmotility and/or gastroesophageal reflux. 6. Chronic severe T12 vertebral compression fracture. 7. Aberrant nonaneurysmal right subclavian artery. 8.  Aortic Atherosclerosis (ICD10-I70.0).   07/04/2022 Tumor Marker   CEA 9.3   07/10/2022 Imaging   MRI abdomen w wo contrast 1. 11 mm LEFT adrenal lesion is most consistent with an adenoma.Based on current clinical literature, biochemical evaluation to exclude possible functioning adrenal nodule is suggested if not already performed.  2. Locally advanced colonic neoplasm involving musculature in the LEFT lower quadrant including LEFT iliac and oblique insertion sites along the LEFT iliac fossa better demonstrated on recent CT imaging of the chest, abdomen and pelvis. 3. Hepatic cysts.4. Large hiatal hernia, nearly the entire stomach is herniated into the chest.   07/29/2022 Surgery   Patient went left partial colectomy and pelvic wall resection.  Pathology showed A: Colon and abdominal wall, left partial colectomy with resection Invasive colorectal adenocarcinoma, perforated, with associated pericolonic abscess and involving psoas muscle.  Inflammatory response involving left fallopian tube and ovary.  Simple  ovarian cyst, tattoo ink, left ptosis and hyperplastic polyps  B.  Pelvic wall resection, Focal invasive colorectal adenocarcinoma involving fibroadipose tissue with abscess and inflamed granulation tissue.  Histology grade: G2, moderately differentiated, no LVI, no perineural invasion, tumor budding score low [0-4] All margins negative for invasive carcinoma, high-grade dysplasia/intramucosal carcinoma and low-grade dysplastic. 60 regional lymph nodes were all negative for tumor involvement.  No tumor deposits.     Patient denies unintentional weight loss, blood in his stool, abdominal pain, nausea vomiting. She denies any constipation or difficulty passing bowel movements.  Her stool is dark, she attributes to taking oral iron supplementation.  Family history positive for brother with lung cancer.    INTERVAL HISTORY Kristin Miller is a 87 y.o. female who has above history reviewed by me today presents for follow up visit for colon cancer.  S/p surgical resection.  She presents to discuss pathology results and adjuvant plan.  + Fatigue. No nausea, vomiting, fever or chills. She tolerated IV Venofer well.    MEDICAL HISTORY:  Past Medical History:  Diagnosis Date   Allergy    Anemia    H/O AS A CHILD   Arthritis    Back pain    Breast cancer of upper-outer quadrant of right female breast ([place]) 06/12/2015  4 millimeter, T1a, N0; ER 90%, PR 0%, HER-2/neu not overexpressed.  Declined radiation therapy.   Collar bone fracture 05/28/2013   Fractured Collar bone on left   Dyspnea    Dysrhythmia    h/o heart skipping beat years ago   Frequent headaches    h/o migraines   Hypertension    Osteopenia 04/2018   Bone density without significant interval change since 2017.   Pneumonia     SURGICAL HISTORY: Past Surgical History:  Procedure Laterality Date   ABDOMINAL HYSTERECTOMY  1970   Partial   BREAST BIOPSY Right 05/28/2015   INVASIVE MAMMARY CARCINOMA    BREAST BIOPSY  Right 06/12/2015   Wide excision, sentinel lymph node biopsy.   BREAST LUMPECTOMY WITH AXILLARY LYMPH NODE DISSECTION Right 06/12/2015   4 mm, T1a,N0 withDCIS with clear margins. ER: 90%, PR 0%; Her 2 neu not overexpressed.    CATARACT EXTRACTION W/PHACO Left 02/03/2017   Procedure: CATARACT EXTRACTION PHACO AND INTRAOCULAR LENS PLACEMENT (IOC);  Surgeon: Birder Robson, MD;  Location: [facility];  Service: Ophthalmology;  Laterality: Left;  [place]  00:28 AP% 16.3 CDE 4.68 Fluid pack lot # [id number]   CATARACT EXTRACTION W/PHACO Right 03/01/2019   Procedure: CATARACT EXTRACTION PHACO AND INTRAOCULAR LENS PLACEMENT ([place])  RIGHT;  Surgeon: Birder Robson, MD;  Location: [place];  Service: Ophthalmology;  Laterality: Right;   COLONOSCOPY     COLONOSCOPY WITH PROPOFOL N/A 06/27/2022   Procedure: COLONOSCOPY WITH PROPOFOL;  Surgeon: [name], MD;  Location: [place];  Service: Endoscopy;  Laterality: N/A;   ESOPHAGOGASTRODUODENOSCOPY (EGD) WITH PROPOFOL N/A 06/27/2022   Procedure: ESOPHAGOGASTRODUODENOSCOPY (EGD) WITH PROPOFOL;  Surgeon: [name], MD;  Location: [place];  Service: Endoscopy;  Laterality: N/A;   PARTIAL COLECTOMY N/A 07/29/2022   Procedure: PARTIAL COLECTOMY, open left, RNFA to assist;  Surgeon: Jules Husbands, MD;  Location: [facility];  Service: General;  Laterality: N/A;    SOCIAL HISTORY: Social History   Socioeconomic History   Marital status: Married    Spouse name: ,Meda Coffee   Number of children: 3   Years of education: GED   Highest education level: 12th grade  Occupational History   Occupation: Retired  Tobacco Use   Smoking status: Never    Passive exposure: Never   Smokeless tobacco: Never   Tobacco comments:    smoking cessation materials not required  Vaping Use   Vaping Use: Never used  Substance and Sexual Activity   Alcohol use: No   Drug use: No   Sexual activity: Not Currently  Other Topics Concern   Not on file   Social History Narrative   Not on file   Social Determinants of Health   Financial Resource Strain: Low Risk  (09/25/2021)   Overall Financial Resource Strain (CARDIA)    Difficulty of Paying Living Expenses: Not hard at all  Food Insecurity: No Food Insecurity (08/04/2022)   Hunger Vital Sign    Worried About Running Out of Food in the Last Year: Never true    [place] in the Last Year: Never true  Transportation Needs: No Transportation Needs (08/04/2022)   PRAPARE - Hydrologist (Medical): No    Lack of Transportation (Non-Medical): No  Physical Activity: Inactive (09/25/2021)   Exercise Vital Sign    Days of Exercise per Week: 0 days    Minutes of Exercise per Session: 0 min  Stress: No Stress Concern Present (09/25/2021)  [employer] - Occupational Stress Questionnaire    Feeling of Stress : Not at all  Social Connections: Moderately Integrated (09/25/2021)   Social Connection and Isolation Panel [NHANES]    Frequency of Communication with Friends and Family: More than three times a week    Frequency of Social Gatherings with Friends and Family: More than three times a week    Attends Religious Services: More than 4 times per year    Active Member of [employer] or Organizations: No    Attends Archivist Meetings: Never    Marital Status: Married  Human resources officer Violence: Not At Risk (08/04/2022)   Humiliation, Afraid, Rape, and Kick questionnaire    Fear of Current or Ex-Partner: No    Emotionally Abused: No    Physically Abused: No    Sexually Abused: No    FAMILY HISTORY: Family History  Problem Relation Age of Onset   Mental illness Mother        Dementia   Stroke Father    Cancer Brother        Lung Cancer   Breast cancer Other     ALLERGIES:  is allergic to [place] [brimonidine tartrate-timolol], lisinopril, and other.  MEDICATIONS:  Current Outpatient Medications  Medication Sig Dispense  Refill   acetaminophen (TYLENOL) 500 MG tablet Take 500 mg by mouth every 6 (six) hours as needed for pain.     latanoprost (XALATAN) 0.005 % ophthalmic solution 1 drop at bedtime. Eye doctor     losartan-hydrochlorothiazide (HYZAAR) 100-12.5 MG tablet Take 1 tablet by mouth daily. 90 tablet 1   methocarbamol (ROBAXIN) 500 MG tablet Take 1 tablet (500 mg total) by mouth every 8 (eight) hours as needed for muscle spasms. 15 tablet 0   pantoprazole (PROTONIX) 40 MG tablet Take 1 tablet (40 mg total) by mouth 2 (two) times daily before a meal. 60 tablet 2   Ferrous Fumarate (HEMOCYTE - 106 MG FE) 324 (106 Fe) MG TABS tablet Take 1 tablet (106 mg of iron total) by mouth 2 (two) times daily. (Patient not taking: Reported on 08/12/2022) 30 tablet 1   ondansetron (ZOFRAN-ODT) 4 MG disintegrating tablet Take 1 tablet (4 mg total) by mouth every 6 (six) hours as needed for nausea. (Patient not taking: Reported on 08/12/2022) 20 tablet 0   oxyCODONE (OXY IR/ROXICODONE) 5 MG immediate release tablet Take 1 tablet (5 mg total) by mouth every 6 (six) hours as needed for severe pain or breakthrough pain. (Patient not taking: Reported on 08/12/2022) 20 tablet 0   promethazine-dextromethorphan (PROMETHAZINE-DM) 6.25-15 MG/5ML syrup Take 2.5 mLs by mouth 4 (four) times daily as needed. (Patient not taking: Reported on 08/12/2022) 118 mL 0   No current facility-administered medications for this visit.    Review of Systems  Constitutional:  Positive for fatigue. Negative for appetite change, chills and fever.  HENT:   Negative for hearing loss and voice change.   Eyes:  Negative for eye problems.  Respiratory:  Negative for chest tightness and cough.   Cardiovascular:  Negative for chest pain.  Gastrointestinal:  Negative for abdominal distention, abdominal pain and blood in stool.       Dark stool  Endocrine: Negative for hot flashes.  Genitourinary:  Negative for difficulty urinating and frequency.    Musculoskeletal:  Negative for arthralgias.  Skin:  Negative for itching and rash.  Neurological:  Negative for extremity weakness.  Hematological:  Negative for adenopathy.  Psychiatric/Behavioral:  Negative for confusion.  PHYSICAL EXAMINATION: ECOG PERFORMANCE STATUS: 1 - Symptomatic but completely ambulatory  Vitals:   08/12/22 1414  BP: 126/63  Pulse: 88  Resp: 18  Temp: 97.8 F (36.6 C)   Filed Weights   08/12/22 1414  Weight: 122 lb 14.4 oz (55.7 kg)    Physical Exam Constitutional:      General: She is not in acute distress.    Appearance: She is not diaphoretic.  HENT:     Head: Normocephalic and atraumatic.     Nose: Nose normal.     Mouth/Throat:     Pharynx: No oropharyngeal exudate.  Eyes:     General: No scleral icterus.    Pupils: Pupils are equal, round, and reactive to light.  Cardiovascular:     Rate and Rhythm: Normal rate and regular rhythm.     Heart sounds: No murmur heard. Pulmonary:     Effort: Pulmonary effort is normal. No respiratory distress.     Breath sounds: No rales.  Chest:     Chest wall: No tenderness.  Abdominal:     General: There is no distension.     Palpations: Abdomen is soft.     Tenderness: There is no abdominal tenderness.  Musculoskeletal:        General: Normal range of motion.     Cervical back: Normal range of motion and neck supple.  Skin:    General: Skin is warm and dry.     Findings: No erythema.  Neurological:     Mental Status: She is alert and oriented to person, place, and time.     Cranial Nerves: No cranial nerve deficit.     Motor: No abnormal muscle tone.     Coordination: Coordination normal.  Psychiatric:        Mood and Affect: Affect normal.      LABORATORY DATA:  I have reviewed the data as listed    Latest Ref Rng & Units 08/04/2022    8:11 AM 08/03/2022    7:41 PM 08/03/2022    8:43 AM  CBC  WBC 4.0 - 10.5 K/uL 11.9     Hemoglobin 12.0 - 15.0 g/dL 8.2  8.3  8.8   Hematocrit  36.0 - 46.0 % 26.6  26.8  28.4   Platelets 150 - 400 K/uL 472         Latest Ref Rng & Units 08/04/2022    8:11 AM 08/03/2022    5:40 AM 08/02/2022   11:39 PM  CMP  Glucose 70 - 99 mg/dL 84  124  116   BUN 8 - 23 mg/dL '7  11  15   '$ Creatinine 0.44 - 1.00 mg/dL 0.58  0.58  0.58   Sodium 135 - 145 mmol/L 135  134  134   Potassium 3.5 - 5.1 mmol/L 3.5  3.6  3.6   Chloride 98 - 111 mmol/L 105  104  104   CO2 22 - 32 mmol/L '23  20  22   '$ Calcium 8.9 - 10.3 mg/dL 7.6  7.8  8.1   Total Protein 6.5 - 8.1 g/dL   5.6   Total Bilirubin 0.3 - 1.2 mg/dL   0.7   Alkaline Phos 38 - 126 U/L   77   AST 15 - 41 U/L   27   ALT 0 - 44 U/L   18      RADIOGRAPHIC STUDIES: I have personally reviewed the radiological images as listed and agreed with the findings in the  report. DG Abdomen 1 View  Result Date: 08/03/2022 CLINICAL DATA:  87 year old female status post nasogastric tube placement. EXAM: ABDOMEN - 1 VIEW COMPARISON:  No priors. FINDINGS: Nasogastric tube in position, with tip projecting over the lower left hemithorax. The tube appears coiled, with tip likely in the stomach, however, this appears to be within the portion of the stomach that is intrathoracic (the patient had a moderate to large hiatal hernia on prior CT examination). Visualized bowel-gas pattern is unremarkable. Skin staples are evident in the midline of the mid abdomen. IMPRESSION: 1. Poorly positioned nasogastric tube which appears coiled in the proximal stomach, likely with tip in a hiatal hernia. Advancement of the nasogastric tube is suggested for more optimal positioning. Electronically Signed   By: Vinnie Langton M.D.   On: 08/03/2022 06:19   CT ANGIO GI BLEED  Result Date: 08/03/2022 CLINICAL DATA:  Lower GI bleed, rectal bleeding. Status post colectomy with ostomy. EXAM: CTA ABDOMEN AND PELVIS WITHOUT AND WITH CONTRAST TECHNIQUE: Multidetector CT imaging of the abdomen and pelvis was performed using the standard protocol  during bolus administration of intravenous contrast. Multiplanar reconstructed images and MIPs were obtained and reviewed to evaluate the vascular anatomy. RADIATION DOSE REDUCTION: This exam was performed according to the departmental dose-optimization program which includes automated exposure control, adjustment of the mA and/or kV according to patient size and/or use of iterative reconstruction technique. CONTRAST:  175m OMNIPAQUE IOHEXOL 350 MG/ML SOLN COMPARISON:  07/03/2022 FINDINGS: VASCULAR Aorta: Atherosclerotic calcifications and irregularity. No aneurysm or dissection. Celiac: Mild-to-moderate narrowing proximally, patent. No aneurysm or dissection. SMA: Widely patent Renals: Widely patent IMA: Patent. Inflow: Atherosclerotic calcifications.  No aneurysm or dissection. Proximal Outflow: No aneurysm or dissection. Veins: No obvious venous abnormality within the limitations of this arterial phase study. No focal hepatic abnormality. Gallbladder unremarkable. Review of the MIP images confirms the above findings. NON-VASCULAR Lower chest: Trace bilateral effusions. Bibasilar atelectasis. Moderate to large hiatal hernia. Distal esophagus appears thick walled suggesting esophagitis. Fluid noted within the distal esophagus which could be related to reflux or dysmotility. Hepatobiliary: No focal hepatic abnormality. Gallbladder unremarkable. Pancreas: No focal abnormality or ductal dilatation. Spleen: No focal abnormality.  Normal size. Adrenals/Urinary Tract: No adrenal abnormality. No focal renal abnormality. No stones or hydronephrosis. Urinary bladder is unremarkable. Stomach/Bowel: Changes of partial colectomy with Hartmann's pouch and left lower quadrant ostomy. No active extravasation of contrast. Stomach is moderately distended with fluid as is the duodenum and proximal jejunum. Gradual decreased caliber of small bowel. Mid to distal small bowel are decompressed. Lymphatic: No adenopathy Reproductive:  Prior hysterectomy.  No adnexal masses. Other: Moderate free air in the abdomen and pelvis, presumably from recent surgery. Gas also seen within the subcutaneous soft tissues in the anterior abdominal wall. Trace free fluid in the cul-de-sac. Musculoskeletal: No acute bony abnormality. Severe chronic compression fracture at T12 with vertebral plana. IMPRESSION: VASCULAR No active contrast extravasation to suggest or localize active GI bleed. Aortic atherosclerosis. NON-VASCULAR Postoperative changes from left colectomy. Moderate free air in the abdomen and pelvis likely related to recent postoperative state. Mildly distended stomach and proximal small bowel with gradual decompression. This favors ileus although a degree of small-bowel obstruction cannot be excluded. Fluid-filled distal esophagus with moderate-sized hiatal hernia. Distal esophagus appears thick walled suggesting esophagitis. Electronically Signed   By: KRolm BaptiseM.D.   On: 08/03/2022 00:37

## 2022-08-12 NOTE — Assessment & Plan Note (Signed)
Recommend additional IV venofer weekly x 3

## 2022-08-12 NOTE — Progress Notes (Signed)
[facility] POST-OP OFFICE VISIT  08/12/2022  HPI: Kristin Miller is a 87 y.o. female 14 days s/p open LAR with end colostomy, abdominal wall resection to include psoas and left transversus abdominus muscles (10x10 cm), and left oophorectomy for advanced descending colon CA.   Overall doing well No complaints of pain She does note two episodes of mild urinary urgency and minimal incontinence since surgery but feels this was a result of not being able to get to the commode fast enough No fever, chills, nausea, emesis Emptying colostomy about once daily; semi-solid stools; positive flatus Incisions well healed Did see ostomy clinic this AM Plan for oncology follow up this afternoon  Vital signs: BP (!) 159/87   Pulse 87   Temp 98 F (36.7 C) (Oral)   Ht 5' (1.524 m)   Wt 121 lb (54.9 kg)   SpO2 98%   BMI 23.63 kg/m    Physical Exam: Constitutional: Well appearing female, NAD Abdomen: Soft, non-tender, non-distended, no rebound/guarding. End colostomy in left abdomen; pink, patent. Recently changed so no gas nor stool in bag  Skin: Midline laparotomy is CDI with staples (removed); no erythema or drainage   Assessment/Plan: This is a 87 y.o. female 14 days s/p open LAR with end colostomy, abdominal wall resection to include psoas and left transversus abdominus muscles (10x10 cm), and left oophorectomy for advanced descending colon CA.    - Staples removed; steri-strips placed  - Continue ostomy care; appreciate [place] assistance. Monitor output  - Pain control prn; doing well, no new needs  - Reviewed wound care recommendation  - Reviewed lifting restrictions; 6 weeks total; okay for HHPT  - Has oncology follow up this afternoon; will follow up recommendation   - We will see her again in ~2 weeks for recheck; She, and her husband, understand to call with questions/concerns in the interim  -- Edison Simon, PA-C [facility] 08/12/2022, 11:42  AM M-F: 7am - 4pm

## 2022-08-12 NOTE — Patient Instructions (Signed)

## 2022-08-13 ENCOUNTER — Inpatient Hospital Stay: Payer: Medicare HMO

## 2022-08-13 ENCOUNTER — Inpatient Hospital Stay (HOSPITAL_BASED_OUTPATIENT_CLINIC_OR_DEPARTMENT_OTHER): Payer: Medicare HMO | Admitting: Licensed Clinical Social Worker

## 2022-08-13 ENCOUNTER — Encounter: Payer: Self-pay | Admitting: Licensed Clinical Social Worker

## 2022-08-13 DIAGNOSIS — Z853 Personal history of malignant neoplasm of breast: Secondary | ICD-10-CM | POA: Diagnosis not present

## 2022-08-13 DIAGNOSIS — C186 Malignant neoplasm of descending colon: Secondary | ICD-10-CM

## 2022-08-13 DIAGNOSIS — Z803 Family history of malignant neoplasm of breast: Secondary | ICD-10-CM

## 2022-08-13 DIAGNOSIS — Z808 Family history of malignant neoplasm of other organs or systems: Secondary | ICD-10-CM | POA: Diagnosis not present

## 2022-08-13 DIAGNOSIS — Z801 Family history of malignant neoplasm of trachea, bronchus and lung: Secondary | ICD-10-CM

## 2022-08-13 NOTE — Progress Notes (Signed)
REFERRING PROVIDER: Earlie Server, MD [place],  [place] [address]  PRIMARY PROVIDER:  Lorna Few, DO  PRIMARY REASON FOR VISIT:  1. Malignant neoplasm of descending colon ([place])   2. History of breast cancer   3. Family history of breast cancer   4. Family history of glioblastoma   5. Family history of lung cancer      HISTORY OF PRESENT ILLNESS:   Kristin Miller, a 87 y.o. female, was seen for a [facility] cancer genetics consultation at the request of Dr. Tasia Catchings due to a personal and family history of cancer.  Kristin Miller presents to clinic today to discuss the possibility of a hereditary predisposition to cancer, genetic testing, and to further clarify her future cancer risks, as well as potential cancer risks for family members.    CANCER HISTORY:  At the age of 12, Kristin Miller was diagnosed with right breast cancer. This was treated with lumpectomy and adjuvant endocrine therapy. In 2023, at the age of 20, Kristin Miller was diagnosed with cancer of the descending colon. The treatment plan includes partial colectomy and pelvic wall resection, completed 07/29/2022. Tumor testing showed loss of PMS2.    Oncology History  Malignant neoplasm of colon ([facility])  06/27/2022 Cancer Staging   Staging form: Colon and Rectum, AJCC 8th Edition - Clinical stage from 06/27/2022: Stage IIC (cT4b, cN0, cM0) - Signed by Earlie Server, MD on 07/05/2022 Stage prefix: Initial diagnosis Total positive nodes: 0   06/27/2022 Initial Diagnosis   Cancer of descending colon  -Patient was found to have iron deficiency anemia -06/27/2022 colonoscopy showed fungating partially obstructing large mass found in the descending colon.  The mass was circumferential, oozing was present. Biopsy showed invasive adenocarcinoma. -06/27/2022, EGD showed large hiatal hernia, esophagus plaque was found, suspicious for candidiasis.  Cells for cytology obtained.  Normal stomach and examined duodenum. Esophagus pression  showed squamous epithelial cells admixed with mixed inflammatory cells and numerous fungal organisms, morphologically consistent with Candida species.  -Dr. Allen Norris has prescribed a course of Diflucan.     07/03/2022 Imaging   CT chest abdomen pelvis with contrast 1. Large irregular annular locally advanced colonic mass centered in the descending colon measuring up to 8.7 x 6.9 x 8.8 cm, with invasion of the pericolonic fat and direct tumor invasion of portions of the left iliopsoas muscle at the level of the superior iliac crest and direct invasion of the left lateral flank muscle wall. Findings are compatible with primary colonic malignancy. No bowel obstruction. 2. No lymphadenopathy or other definite findings of metastatic disease. 3. Two scattered subcentimeter hypodense liver lesions, too small to accurately characterize, indeterminate. Indeterminate left adrenal 1.3 cm nodule. Further evaluation with MRI abdomen without and with IV contrast may be obtained at this time, as clinically warranted. 4. Two scattered tiny solid left pulmonary nodules, largest 0.3 cm, indeterminate. Recommend attention on follow-up chest CT in 3 months. 5. Large hiatal hernia. Mildly patulous lower thoracic esophagus with small amount of layering oral contrast, suggesting esophageal dysmotility and/or gastroesophageal reflux. 6. Chronic severe T12 vertebral compression fracture. 7. Aberrant nonaneurysmal right subclavian artery. 8.  Aortic Atherosclerosis (ICD10-I70.0).   07/04/2022 Tumor Marker   CEA 9.3   07/10/2022 Imaging   MRI abdomen w wo contrast 1. 11 mm LEFT adrenal lesion is most consistent with an adenoma.Based on current clinical literature, biochemical evaluation to exclude possible functioning adrenal nodule is suggested if not already performed.  2. Locally advanced colonic neoplasm  involving musculature in the LEFT lower quadrant including LEFT iliac and oblique insertion sites along the LEFT iliac  fossa better demonstrated on recent CT imaging of the chest, abdomen and pelvis. 3. Hepatic cysts.4. Large hiatal hernia, nearly the entire stomach is herniated into the chest.   07/29/2022 Surgery   Patient went left partial colectomy and pelvic wall resection.  Pathology showed A: Colon and abdominal wall, left partial colectomy with resection Invasive colorectal adenocarcinoma, perforated, with associated pericolonic abscess and involving psoas muscle.  Inflammatory response involving left fallopian tube and ovary.  Simple ovarian cyst, tattoo ink, left ptosis and hyperplastic polyps  B.  Pelvic wall resection, Focal invasive colorectal adenocarcinoma involving fibroadipose tissue with abscess and inflamed granulation tissue.  Histology grade: G2, moderately differentiated, no LVI, no perineural invasion, tumor budding score low [0-4] All margins negative for invasive carcinoma, high-grade dysplasia/intramucosal carcinoma and low-grade dysplastic. 78 regional lymph nodes were all negative for tumor involvement.  No tumor deposits.      RISK FACTORS:  Menarche was at age 33.  First live birth at age 52.  Ovaries intact: no. - has 1 ovary? Hysterectomy: partial hysterectomy in the 70s.  Menopausal status: postmenopausal.   Past Medical History:  Diagnosis Date   Allergy    Anemia    H/O AS A CHILD   Arthritis    Back pain    Breast cancer of upper-outer quadrant of right female breast ([facility]) 06/12/2015   4 millimeter, T1a, N0; ER 90%, PR 0%, HER-2/neu not overexpressed.  Declined radiation therapy.   Collar bone fracture 05/28/2013   Fractured Collar bone on left   Dyspnea    Dysrhythmia    h/o heart skipping beat years ago   Frequent headaches    h/o migraines   Hypertension    Osteopenia 04/2018   Bone density without significant interval change since 2017.   Pneumonia     Past Surgical History:  Procedure Laterality Date   ABDOMINAL HYSTERECTOMY  1970   Partial    BREAST BIOPSY Right 05/28/2015   INVASIVE MAMMARY CARCINOMA    BREAST BIOPSY Right 06/12/2015   Wide excision, sentinel lymph node biopsy.   BREAST LUMPECTOMY WITH AXILLARY LYMPH NODE DISSECTION Right 06/12/2015   4 mm, T1a,N0 withDCIS with clear margins. ER: 90%, PR 0%; Her 2 neu not overexpressed.    CATARACT EXTRACTION W/PHACO Left 02/03/2017   Procedure: CATARACT EXTRACTION PHACO AND INTRAOCULAR LENS PLACEMENT (IOC);  Surgeon: Birder Robson, MD;  Location: [facility];  Service: Ophthalmology;  Laterality: Left;  [place]  00:28 AP% 16.3 CDE 4.68 Fluid pack lot # [id number]   CATARACT EXTRACTION W/PHACO Right 03/01/2019   Procedure: CATARACT EXTRACTION PHACO AND INTRAOCULAR LENS PLACEMENT ([place])  RIGHT;  Surgeon: Birder Robson, MD;  Location: [facility];  Service: Ophthalmology;  Laterality: Right;   COLONOSCOPY     COLONOSCOPY WITH PROPOFOL N/A 06/27/2022   Procedure: COLONOSCOPY WITH PROPOFOL;  Surgeon: [name], MD;  Location: [facility];  Service: Endoscopy;  Laterality: N/A;   ESOPHAGOGASTRODUODENOSCOPY (EGD) WITH PROPOFOL N/A 06/27/2022   Procedure: ESOPHAGOGASTRODUODENOSCOPY (EGD) WITH PROPOFOL;  Surgeon: [name], MD;  Location: [facility];  Service: Endoscopy;  Laterality: N/A;   PARTIAL COLECTOMY N/A 07/29/2022   Procedure: PARTIAL COLECTOMY, open left, RNFA to assist;  Surgeon: Jules Husbands, MD;  Location: [facility];  Service: General;  Laterality: N/A;    FAMILY HISTORY:  We obtained a detailed, 4-generation family history.  Significant diagnoses are listed  below: Family History  Problem Relation Age of Onset   Mental illness Mother        Dementia   Stroke Father    Lung cancer Brother    Breast cancer Other        dx under 90   Brain cancer Granddaughter 52       glioblastoma   Ms. Yapp had 3 children, 2 daughters and 1 son. One daughter passed in a car accident, the other passed in infancy. Her son is living at 63 and had a daughter  who had glioblastoma diagnosed at 64 and passed at 34. Ms. Krotz also has 9 brothers and 4 sisters. One brother had lung cancer. A sister's daughter, patient's niece, had breast cancer under age 18.  Ms. Grade mother died at 71. No known cancers on this side of the family.  Ms. Weyman father died at 96 of a stroke, no known cancers on this side of the family.  Ms. Mikolajczak is unaware of previous family history of genetic testing for hereditary cancer risks. There is no reported Ashkenazi Jewish ancestry. There is no known consanguinity.    GENETIC COUNSELING ASSESSMENT: Ms. Debellis is a 87 y.o. female with a personal history of breast and colon cancer with loss of PMS2 which is somewhat suggestive of a hereditary cancer syndrome and predisposition to cancer. We, therefore, discussed and recommended the following at today's visit.   DISCUSSION: We discussed that approximately 10% of colorectal cancer is hereditary. Most cases of hereditary colorectal cancer are associated with Lynch syndrome genes, which includes PMS2. 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 There are other genes associated with hereditary cancer as well. Cancers and risks are gene specific. We discussed that testing is beneficial for several reasons including knowing about cancer risks, identifying potential screening and risk-reduction options that may be appropriate, and to understand if other family members could be at risk for cancer and allow them to undergo genetic testing.   We reviewed the characteristics, features and inheritance patterns of hereditary cancer syndromes. We also discussed genetic testing, including the appropriate family members to test, the process of testing, insurance coverage and turn-around-time for results. We discussed the implications of a negative, positive and/or variant of uncertain significant result. We recommended Ms.  Cropp pursue genetic testing for the [facility] Common Hereditary Cancers+RNA gene panel.   Based on Ms. Ditmars's personal and family history of cancer, she meets medical criteria for genetic testing. Despite that she meets criteria, she may still have an out of pocket cost. We discussed that if her out of pocket cost for testing is over $100, the laboratory will call and confirm whether she wants to proceed with testing.  If the out of pocket cost of testing is less than $100 she will be billed by the genetic testing laboratory.   PLAN: After considering the risks, benefits, and limitations, Ms. Vanvalkenburgh provided informed consent to pursue genetic testing and the blood sample was sent to [facility] for analysis of the Common Hereditary Cancers+RNA panel. Results should be available within approximately 2-3 weeks' time, at which point they will be disclosed by telephone to Ms. Johnson, as will any additional recommendations warranted by these results. Ms. Perkinson will receive a summary of her genetic counseling visit and a copy of her results once available. This information will also be available in Epic.   Ms. Theroux questions were answered to her satisfaction  today. Our contact information was provided should additional questions or concerns arise. Thank you for the referral and allowing [place] to share in the care of your patient.   Faith Rogue, MS, [facility] Genetic Counselor [email or web address] Phone: [phone]  The patient was seen for a total of 40 minutes in face-to-face genetic counseling. Patient's husband was also present.  Dr. Grayland Ormond was available for discussion regarding this case.   _______________________________________________________________________ For Office Staff:  Number of people involved in session: 2 Was an Intern/ student involved with case: no

## 2022-08-14 DIAGNOSIS — K9400 Colostomy complication, unspecified: Secondary | ICD-10-CM | POA: Insufficient documentation

## 2022-08-14 DIAGNOSIS — K5909 Other constipation: Secondary | ICD-10-CM | POA: Insufficient documentation

## 2022-08-15 ENCOUNTER — Ambulatory Visit (HOSPITAL_COMMUNITY): Payer: Medicare HMO | Admitting: Nurse Practitioner

## 2022-08-18 ENCOUNTER — Telehealth: Payer: Self-pay

## 2022-08-18 DIAGNOSIS — C186 Malignant neoplasm of descending colon: Secondary | ICD-10-CM

## 2022-08-18 NOTE — Telephone Encounter (Signed)
-----  Message from Earlie Server, MD sent at 08/18/2022  8:02 AM EST ----- Please refer her to [facility]  She is aware about the plan. Thanks.  zy

## 2022-08-19 ENCOUNTER — Other Ambulatory Visit (HOSPITAL_COMMUNITY): Payer: Self-pay | Admitting: Nurse Practitioner

## 2022-08-19 DIAGNOSIS — L24B3 Irritant contact dermatitis related to fecal or urinary stoma or fistula: Secondary | ICD-10-CM

## 2022-08-19 DIAGNOSIS — K9400 Colostomy complication, unspecified: Secondary | ICD-10-CM

## 2022-08-20 ENCOUNTER — Inpatient Hospital Stay: Payer: Medicare HMO

## 2022-08-20 VITALS — BP 143/72 | HR 78 | Temp 97.2°F | Resp 18

## 2022-08-20 DIAGNOSIS — D500 Iron deficiency anemia secondary to blood loss (chronic): Secondary | ICD-10-CM

## 2022-08-20 DIAGNOSIS — C189 Malignant neoplasm of colon, unspecified: Secondary | ICD-10-CM

## 2022-08-20 DIAGNOSIS — C186 Malignant neoplasm of descending colon: Secondary | ICD-10-CM | POA: Diagnosis not present

## 2022-08-20 MED ORDER — SODIUM CHLORIDE 0.9 % IV SOLN
Freq: Once | INTRAVENOUS | Status: AC
  Filled 2022-08-20: qty 250

## 2022-08-20 MED ORDER — SODIUM CHLORIDE 0.9 % IV SOLN
200.0000 mg | Freq: Once | INTRAVENOUS | Status: AC
  Administered 2022-08-20: 200 mg via INTRAVENOUS
  Filled 2022-08-20: qty 200

## 2022-08-20 NOTE — Patient Instructions (Signed)
[facility]  Discharge Instructions: Thank you for choosing [facility] to provide your oncology and hematology care.  If you have a lab appointment with the [facility], please go directly to the [facility] and check in at the registration area.  Wear comfortable clothing and clothing appropriate for easy access to any Portacath or PICC line.   We strive to give you quality time with your provider. You may need to reschedule your appointment if you arrive late (15 or more minutes).  Arriving late affects you and other patients whose appointments are after yours.  Also, if you miss three or more appointments without notifying the office, you may be dismissed from the clinic at the provider's discretion.      For prescription refill requests, have your pharmacy contact our office and allow 72 hours for refills to be completed.    Today you received the following chemotherapy and/or immunotherapy agents VENOFER       To help prevent nausea and vomiting after your treatment, we encourage you to take your nausea medication as directed.  BELOW ARE SYMPTOMS THAT SHOULD BE REPORTED IMMEDIATELY: *FEVER GREATER THAN 100.4 F (38 C) OR HIGHER *CHILLS OR SWEATING *NAUSEA AND VOMITING THAT IS NOT CONTROLLED WITH YOUR NAUSEA MEDICATION *UNUSUAL SHORTNESS OF BREATH *UNUSUAL BRUISING OR BLEEDING *URINARY PROBLEMS (pain or burning when urinating, or frequent urination) *BOWEL PROBLEMS (unusual diarrhea, constipation, pain near the anus) TENDERNESS IN MOUTH AND THROAT WITH OR WITHOUT PRESENCE OF ULCERS (sore throat, sores in mouth, or a toothache) UNUSUAL RASH, SWELLING OR PAIN  UNUSUAL VAGINAL DISCHARGE OR ITCHING   Items with * indicate a potential emergency and should be followed up as soon as possible or go to the Emergency Department if any problems should occur.  Please show the CHEMOTHERAPY ALERT CARD or IMMUNOTHERAPY ALERT CARD at check-in to  the Emergency Department and triage nurse.  Should you have questions after your visit or need to cancel or reschedule your appointment, please contact [facility]  [phone] and follow the prompts.  Office hours are 8:00 a.m. to 4:30 p.m. Monday - Friday. Please note that voicemails left after 4:00 p.m. may not be returned until the following business day.  We are closed weekends and major holidays. You have access to a nurse at all times for urgent questions. Please call the main number to the clinic [phone] and follow the prompts.  For any non-urgent questions, you may also contact your provider using MyChart. We now offer e-Visits for anyone 38 and older to request care online for non-urgent symptoms. For details visit [email or web address].   Also download the MyChart app! Go to the app store, search "MyChart", open the app, select [facility], and log in with your MyChart username and password.   Iron Sucrose Injection What is this medication? IRON SUCROSE (EYE ern SOO krose) treats low levels of iron (iron deficiency anemia) in people with kidney disease. Iron is a mineral that plays an important role in making red blood cells, which carry oxygen from your lungs to the rest of your body. This medicine may be used for other purposes; ask your health care provider or pharmacist if you have questions. COMMON BRAND NAME(S): Venofer What should I tell my care team before I take this medication? They need to know if you have any of these conditions: Anemia not caused by low iron levels Heart disease High levels of iron in  the blood Kidney disease Liver disease An unusual or allergic reaction to iron, other medications, foods, dyes, or preservatives Pregnant or trying to get pregnant Breastfeeding How should I use this medication? This medication is for infusion into a vein. It is given in a hospital or clinic setting. Talk to your care team about  the use of this medication in children. While this medication may be prescribed for children as young as 2 years for selected conditions, precautions do apply. Overdosage: If you think you have taken too much of this medicine contact a poison control center or emergency room at once. NOTE: This medicine is only for you. Do not share this medicine with others. What if I miss a dose? Keep appointments for follow-up doses. It is important not to miss your dose. Call your care team if you are unable to keep an appointment. What may interact with this medication? Do not take this medication with any of the following: Deferoxamine Dimercaprol Other iron products This medication may also interact with the following: Chloramphenicol Deferasirox This list may not describe all possible interactions. Give your health care provider a list of all the medicines, herbs, non-prescription drugs, or dietary supplements you use. Also tell them if you smoke, drink alcohol, or use illegal drugs. Some items may interact with your medicine. What should I watch for while using this medication? Visit your care team regularly. Tell your care team if your symptoms do not start to get better or if they get worse. You may need blood work done while you are taking this medication. You may need to follow a special diet. Talk to your care team. Foods that contain iron include: whole grains/cereals, dried fruits, beans, or peas, leafy green vegetables, and organ meats (liver, kidney). What side effects may I notice from receiving this medication? Side effects that you should report to your care team as soon as possible: Allergic reactions--skin rash, itching, hives, swelling of the face, lips, tongue, or throat Low blood pressure--dizziness, feeling faint or lightheaded, blurry vision Shortness of breath Side effects that usually do not require medical attention (report to your care team if they continue or are  bothersome): Flushing Headache Joint pain Muscle pain Nausea Pain, redness, or irritation at injection site This list may not describe all possible side effects. Call your doctor for medical advice about side effects. You may report side effects to FDA at 1-800-FDA-1088. Where should I keep my medication? This medication is given in a hospital or clinic and will not be stored at home. NOTE: This sheet is a summary. It may not cover all possible information. If you have questions about this medicine, talk to your doctor, pharmacist, or health care provider.  2023 Elsevier/Gold Standard (2020-10-18 00:00:00)

## 2022-08-23 ENCOUNTER — Encounter: Payer: Self-pay | Admitting: Oncology

## 2022-08-26 ENCOUNTER — Encounter: Payer: Self-pay | Admitting: Radiation Oncology

## 2022-08-26 ENCOUNTER — Ambulatory Visit
Admission: RE | Admit: 2022-08-26 | Discharge: 2022-08-26 | Disposition: A | Discharge status: Home / Self Care | Payer: Medicare HMO | Source: Ambulatory Visit | Attending: Radiation Oncology | Admitting: Radiation Oncology

## 2022-08-26 VITALS — BP 163/76 | HR 89 | Temp 97.6°F | Resp 16 | Ht 60.0 in | Wt 123.4 lb

## 2022-08-26 DIAGNOSIS — C186 Malignant neoplasm of descending colon: Secondary | ICD-10-CM | POA: Diagnosis present

## 2022-08-26 DIAGNOSIS — D500 Iron deficiency anemia secondary to blood loss (chronic): Secondary | ICD-10-CM | POA: Insufficient documentation

## 2022-08-26 DIAGNOSIS — Z510 Encounter for antineoplastic radiation therapy: Secondary | ICD-10-CM | POA: Insufficient documentation

## 2022-08-26 NOTE — Consult Note (Signed)
NEW PATIENT EVALUATION  Name: Kristin Miller  MRN: [id number]  Date:   08/26/2022     DOB: [birth year]   This 87 y.o. 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  REFERRING PHYSICIAN: Lorna Few, DO  CHIEF COMPLAINT: No chief complaint on file.   DIAGNOSIS: The encounter diagnosis was Malignant neoplasm of descending colon ([place]).   PREVIOUS INVESTIGATIONS:  CT scans and MRI scans reviewed Clinical notes reviewed Surgical pathology report reviewed  HPI: Patient is a 87 year old female who presents with anemia and abdominal pain.  She was found on workup and colonoscopy to have a near obstructing mass in the descending large colon.  Biopsy was positive for adenocarcinoma.  She underwent an open low anterior resection with end colostomy.  She had abdominal wall resection to include the psoas and left transversus abdominis muscle.  Surgical pathology showed invasive colorectal adenocarcinoma with perforation and pericolonic abscess involving the psoas muscle.  Tumor was 9.1 cm grade 2 moderately differentiated with tumor directly invading adjacent structures psoas muscle making a T4b lesion.  All lymph nodes were negative for metastatic disease.  Initial CT scan had shown an 8.7 x 8.8 x 6.9 cm descending colon mass with direct tumor invasion of the left iliopsoas muscle at the level of the superior iliac crest and invasion of the left lateral flank muscle.  She has been declined for systemic chemotherapy based on her age and is now referred to radiation oncology for consideration of treatment.  She is otherwise doing well she specifically denies abdominal pain or discomfort her colostomy is functioning well.  PLANNED TREATMENT REGIMEN: Adjuvant radiation therapy  PAST MEDICAL HISTORY:  has a past medical history of  Allergy, Anemia, Arthritis, Back pain, Breast cancer of upper-outer quadrant of right female breast ([place]) (06/12/2015), Collar bone fracture (05/28/2013), Dyspnea, Dysrhythmia, Frequent headaches, Hypertension, Osteopenia (04/2018), and Pneumonia.    PAST SURGICAL HISTORY:  Past Surgical History:  Procedure Laterality Date   ABDOMINAL HYSTERECTOMY  1970   Partial   BREAST BIOPSY Right 05/28/2015   INVASIVE MAMMARY CARCINOMA    BREAST BIOPSY Right 06/12/2015   Wide excision, sentinel lymph node biopsy.   BREAST LUMPECTOMY WITH AXILLARY LYMPH NODE DISSECTION Right 06/12/2015   4 mm, T1a,N0 withDCIS with clear margins. ER: 90%, PR 0%; Her 2 neu not overexpressed.    CATARACT EXTRACTION W/PHACO Left 02/03/2017   Procedure: CATARACT EXTRACTION PHACO AND INTRAOCULAR LENS PLACEMENT (IOC);  Surgeon: Birder Robson, MD;  Location: [facility];  Service: Ophthalmology;  Laterality: Left;  [place]  00:28 AP% 16.3 CDE 4.68 Fluid pack lot # [id number]   CATARACT EXTRACTION W/PHACO Right 03/01/2019   Procedure: CATARACT EXTRACTION PHACO AND INTRAOCULAR LENS PLACEMENT ([place])  RIGHT;  Surgeon: Birder Robson, MD;  Location: [place];  Service: Ophthalmology;  Laterality: Right;   COLONOSCOPY     COLONOSCOPY WITH PROPOFOL N/A 06/27/2022   Procedure: COLONOSCOPY WITH PROPOFOL;  Surgeon: [name], MD;  Location: [place];  Service: Endoscopy;  Laterality: N/A;   ESOPHAGOGASTRODUODENOSCOPY (EGD) WITH PROPOFOL N/A 06/27/2022   Procedure: ESOPHAGOGASTRODUODENOSCOPY (EGD) WITH PROPOFOL;  Surgeon: [name], MD;  Location: [place];  Service: Endoscopy;  Laterality: N/A;   PARTIAL COLECTOMY N/A 07/29/2022   Procedure: PARTIAL COLECTOMY, open left, RNFA to assist;  Surgeon: Jules Husbands, MD;  Location: [facility];  Service: General;  Laterality: N/A;    FAMILY HISTORY: family history includes Brain cancer (age of onset: 10) in her granddaughter; Breast cancer in an other family  member; Lung cancer in her brother; Mental illness in her mother; Stroke in her father.  SOCIAL HISTORY:  reports that she has never smoked. She has never been exposed to tobacco smoke. She has never used smokeless tobacco. She reports that she does not drink alcohol and does not use drugs.  ALLERGIES: Combigan [brimonidine tartrate-timolol], Lisinopril, and Other  MEDICATIONS:  Current Outpatient Medications  Medication Sig Dispense Refill   acetaminophen (TYLENOL) 500 MG tablet Take 500 mg by mouth every 6 (six) hours as needed for pain.     latanoprost (XALATAN) 0.005 % ophthalmic solution 1 drop at bedtime. Eye doctor     losartan-hydrochlorothiazide (HYZAAR) 100-12.5 MG tablet Take 1 tablet by mouth daily. 90 tablet 1   methocarbamol (ROBAXIN) 500 MG tablet Take 1 tablet (500 mg total) by mouth every 8 (eight) hours as needed for muscle spasms. 15 tablet 0   pantoprazole (PROTONIX) 40 MG tablet Take 1 tablet (40 mg total) by mouth 2 (two) times daily before a meal. 60 tablet 2   Ferrous Fumarate (HEMOCYTE - 106 MG FE) 324 (106 Fe) MG TABS tablet Take 1 tablet (106 mg of iron total) by mouth 2 (two) times daily. (Patient not taking: Reported on 08/12/2022) 30 tablet 1   ondansetron (ZOFRAN-ODT) 4 MG disintegrating tablet Take 1 tablet (4 mg total) by mouth every 6 (six) hours as needed for nausea. (Patient not taking: Reported on 08/12/2022) 20 tablet 0   oxyCODONE (OXY IR/ROXICODONE) 5 MG immediate release tablet Take 1 tablet (5 mg total) by mouth every 6 (six) hours as needed for severe pain or breakthrough pain. (Patient not taking: Reported on 08/12/2022) 20 tablet 0   promethazine-dextromethorphan (PROMETHAZINE-DM) 6.25-15 MG/5ML syrup Take 2.5 mLs by mouth 4 (four) times daily as needed. (Patient not taking: Reported on 08/12/2022) 118 mL 0   No current facility-administered medications for this encounter.    ECOG PERFORMANCE STATUS:  0 - Asymptomatic  REVIEW OF SYSTEMS: Patient  denies any weight loss, fatigue, weakness, fever, chills or night sweats. Patient denies any loss of vision, blurred vision. Patient denies any ringing  of the ears or hearing loss. No irregular heartbeat. Patient denies heart murmur or history of fainting. Patient denies any chest pain or pain radiating to her upper extremities. Patient denies any shortness of breath, difficulty breathing at night, cough or hemoptysis. Patient denies any swelling in the lower legs. Patient denies any nausea vomiting, vomiting of blood, or coffee ground material in the vomitus. Patient denies any stomach pain. Patient states has had normal bowel movements no significant constipation or diarrhea. Patient denies any dysuria, hematuria or significant nocturia. Patient denies any problems walking, swelling in the joints or loss of balance. Patient denies any skin changes, loss of hair or loss of weight. Patient denies any excessive worrying or anxiety or significant depression. Patient denies any problems with insomnia. Patient denies excessive thirst, polyuria, polydipsia. Patient denies any swollen glands, patient denies easy bruising or easy bleeding. Patient denies any recent infections, allergies or URI. Patient "s visual fields have not changed significantly in recent time.   PHYSICAL EXAM: BP (!) 163/76 (BP Location: Left Arm, Patient Position: Sitting, Cuff Size: Normal)   Pulse 89   Temp 97.6 F (36.4 C) (Tympanic)   Resp 16   Ht 5' (1.524 m)  Wt 123 lb 6.4 oz (56 kg)   BMI 24.10 kg/m  Colostomy is functioning well.  Abdomen is benign.  Well-developed well-nourished patient in NAD. HEENT reveals PERLA, EOMI, discs not visualized.  Oral cavity is clear. No oral mucosal lesions are identified. Neck is clear without evidence of cervical or supraclavicular adenopathy. Lungs are clear to A&P. Cardiac examination is essentially unremarkable with regular rate and rhythm without murmur rub or thrill. Abdomen is benign with  no organomegaly or masses noted. Motor sensory and DTR levels are equal and symmetric in the upper and lower extremities. Cranial nerves II through XII are grossly intact. Proprioception is intact. No peripheral adenopathy or edema is identified. No motor or sensory levels are noted. Crude visual fields are within normal range.  LABORATORY DATA: Pathology reports reviewed    RADIOLOGY RESULTS: See scanned MRI scans reviewed compatible with above-stated findings   IMPRESSION: Pathologic T4b descending colon adenocarcinoma with invasion of adjacent structures status post resection in 87 year old female  PLAN: At this time I have recommended based on the T4b nature of her disease adjuvant radiation therapy to the area of original tumor involvement by original CT criteria.  Would plan delivering 45 Gray over 25 fractions.  Risks and benefits of treatment occluding skin reaction fatigue possible diarrhea possible alteration of blood counts and scarring all were described in detail to the patient and her husband.  They both seem to comprehend my treatment plan well.  I have personally set up and ordered CT simulation for later this week.  I would like to take this opportunity to thank you for allowing me to participate in the care of your patient.Noreene Filbert, MD

## 2022-08-27 ENCOUNTER — Encounter: Payer: Self-pay | Admitting: Surgery

## 2022-08-27 ENCOUNTER — Ambulatory Visit (INDEPENDENT_AMBULATORY_CARE_PROVIDER_SITE_OTHER): Payer: Medicare HMO | Admitting: Surgery

## 2022-08-27 VITALS — BP 148/89 | HR 90 | Temp 97.8°F | Ht 60.0 in | Wt 121.0 lb

## 2022-08-27 DIAGNOSIS — Z08 Encounter for follow-up examination after completed treatment for malignant neoplasm: Secondary | ICD-10-CM

## 2022-08-27 DIAGNOSIS — C189 Malignant neoplasm of colon, unspecified: Secondary | ICD-10-CM

## 2022-08-27 DIAGNOSIS — Z09 Encounter for follow-up examination after completed treatment for conditions other than malignant neoplasm: Secondary | ICD-10-CM

## 2022-08-27 NOTE — Patient Instructions (Signed)
If you have any concerns or questions, please feel free to call our office.   Open Partial Colectomy, Adult, Care After The following information offers guidance on how to care for yourself after your procedure. Your health care provider may also give you more specific instructions. If you have problems or questions, contact your health care provider. What can I expect after the surgery? After the procedure, it is common to have: Pain, bruising, or swelling in your abdomen, especially in the incision areas. You will be given medicine to control the pain. Tiredness. Your energy level will return to normal over the next several weeks. Changes in your bowel movements, especially having bowel movements more often. Bloating. Nausea. Trouble urinating. Follow these instructions at home: Medicines Take over-the-counter and prescription medicines only as told by your health care provider. Ask your health care provider if the medicine prescribed to you: Requires you to avoid driving or using machinery. Can cause constipation. You may need to take these actions to prevent or treat constipation: Drink enough fluids to keep your urine pale yellow. Take over-the-counter or prescription medicines, including stool softeners. Limit foods that are high in fat and processed sugars, such as fried or sweet foods. Eating and drinking Follow instructions from your health care provider about eating or drinking restrictions. Do not drink alcohol if your health care provider tells you not to drink. Eat a low-fiber diet for the first 4 weeks after surgery. Most people on a low-fiber eating plan should eat less than 10 grams (g) of fiber a day. Follow recommendations from your health care provider or dietician about how much fiber you should have each day. Always check food labels to know the fiber content of packaged foods. In general, a low-fiber food will have fewer than 2 g of fiber per serving. In general, try  to avoid whole grains, raw fruits and vegetables, dried fruit, tough cuts of meat, nuts, and seeds. Incision care  Follow instructions from your health care provider about how to take care of your incision. Make sure you: Wash your hands with soap and water for at least 20 seconds before and after you change your bandage (dressing). If soap and water are not available, use hand sanitizer. Change your dressing as told by your health care provider. Leave stitches (sutures), skin glue, or adhesive strips in place. These skin closures may need to stay in place for 2 weeks or longer. If adhesive strip edges start to loosen and curl up, you may trim the loose edges. Do not remove adhesive strips completely unless your health care provider tells you to do that. Check your incision area every day for signs of infection. Check for: More redness, swelling, or pain. Fluid or blood. Warmth. Pus or a bad smell. Keep your incisions clean and dry. Avoid wearing tight clothing around your incision. Do not take baths, swim, or use a hot tub until your health care provider approves. Ask your health care provider if you may take showers. You may only be allowed to take sponge baths. Activity  Rest as told by your health care provider. Avoid sitting for a long time without moving. Get up and take short walks every 1-2 hours. This is important to improve blood flow and breathing. Ask for help if you feel weak or unsteady. You may have to avoid lifting. Ask your health care provider how much you can safely lift. Do deep breathing exercises as told by your health care provider. Place your hands or  a pillow over your incision area before you cough. Return to your normal activities as told by your health care provider. Ask your health care provider what activities are safe for you. General instructions Do not use any products that contain nicotine or tobacco. These products include cigarettes, chewing tobacco, and  vaping devices, such as e-cigarettes. If you need help quitting, ask your health care provider. Wear compression stockings as told by your health care provider. These stockings help to prevent blood clots and reduce swelling in your legs. Keep all follow-up visits. This is important to monitor healing and check for any complications. Contact a health care provider if: You have not had a bowel movement in 3 days after surgery. You have chills or fever. Medicine is not controlling your pain. You have any signs of infection at your incision area. You have nausea and vomiting that will not go away. Get help right away if: You have chest pain or trouble breathing. You have a warm, tender swelling in your leg. Your incision breaks open. You have severe pain. You have bleeding from the rectum. These symptoms may be an emergency. Get help right away. Call 911. Do not wait to see if the symptoms will go away. Do not drive yourself to the hospital. Summary After your procedure, it is common to have pain and to be tired. Your incision area may also be sore and tender. Take over-the-counter and prescription medicines only as told by your health care provider. Return to your normal activities as told by your health care provider. Follow instructions from your health care provider about how to take care of your incision. Get help right away if you have chest pain or trouble breathing. This information is not intended to replace advice given to you by your health care provider. Make sure you discuss any questions you have with your health care provider. Document Revised: 10/23/2021 Document Reviewed: 10/23/2021 Elsevier Patient Education  [place].

## 2022-08-28 ENCOUNTER — Encounter: Payer: Self-pay | Admitting: Licensed Clinical Social Worker

## 2022-08-28 ENCOUNTER — Telehealth: Payer: Self-pay | Admitting: Licensed Clinical Social Worker

## 2022-08-28 ENCOUNTER — Ambulatory Visit: Payer: Self-pay | Admitting: Licensed Clinical Social Worker

## 2022-08-28 ENCOUNTER — Ambulatory Visit
Admission: RE | Admit: 2022-08-28 | Discharge: 2022-08-28 | Disposition: A | Discharge status: Home / Self Care | Payer: Medicare HMO | Source: Ambulatory Visit | Attending: Radiation Oncology | Admitting: Radiation Oncology

## 2022-08-28 ENCOUNTER — Other Ambulatory Visit: Payer: Self-pay | Admitting: *Deleted

## 2022-08-28 ENCOUNTER — Inpatient Hospital Stay: Payer: Medicare HMO

## 2022-08-28 VITALS — BP 137/50 | HR 67 | Temp 97.7°F | Resp 16

## 2022-08-28 DIAGNOSIS — Z1379 Encounter for other screening for genetic and chromosomal anomalies: Secondary | ICD-10-CM

## 2022-08-28 DIAGNOSIS — Z1509 Genetic susceptibility to other malignant neoplasm: Secondary | ICD-10-CM | POA: Insufficient documentation

## 2022-08-28 DIAGNOSIS — C189 Malignant neoplasm of colon, unspecified: Secondary | ICD-10-CM

## 2022-08-28 DIAGNOSIS — N186 End stage renal disease: Secondary | ICD-10-CM | POA: Insufficient documentation

## 2022-08-28 DIAGNOSIS — D500 Iron deficiency anemia secondary to blood loss (chronic): Secondary | ICD-10-CM | POA: Insufficient documentation

## 2022-08-28 DIAGNOSIS — Z510 Encounter for antineoplastic radiation therapy: Secondary | ICD-10-CM | POA: Diagnosis not present

## 2022-08-28 DIAGNOSIS — C186 Malignant neoplasm of descending colon: Secondary | ICD-10-CM

## 2022-08-28 MED ORDER — SODIUM CHLORIDE 0.9 % IV SOLN
Freq: Once | INTRAVENOUS | Status: AC
  Filled 2022-08-28: qty 250

## 2022-08-28 MED ORDER — SODIUM CHLORIDE 0.9 % IV SOLN
200.0000 mg | Freq: Once | INTRAVENOUS | Status: AC
  Administered 2022-08-28: 200 mg via INTRAVENOUS
  Filled 2022-08-28: qty 200

## 2022-08-28 NOTE — Telephone Encounter (Signed)
I contacted Ms. Marksberry to discuss her genetic testing results. 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 No other variants identified in the other 47 genes analyzed. Detailed clinic note to follow.   The test report has been scanned into EPIC and is located under the Molecular Pathology section of the Results Review tab.  A portion of the result report is included below for reference.      Faith Rogue, MS, [facility] Genetic Counselor [email or web address] Phone: [phone]

## 2022-08-28 NOTE — Progress Notes (Signed)
Genetic Test Results - PMS2+  HPI:   Kristin Miller was previously seen in the [place] clinic due to a personal and family history of cancer and concerns regarding a hereditary predisposition to cancer. Please refer to our prior cancer genetics clinic note for more information regarding our discussion, assessment and recommendations, at the time. Kristin Miller recent genetic test results were disclosed to her, as were recommendations warranted by these results. These results and recommendations are discussed in more detail below.  CANCER HISTORY:  Oncology History  Malignant neoplasm of colon ([place])  06/27/2022 Cancer Staging   Staging form: Colon and Rectum, AJCC 8th Edition - Clinical stage from 06/27/2022: Stage IIC (cT4b, cN0, cM0) - Signed by Earlie Server, MD on 07/05/2022 Stage prefix: Initial diagnosis Total positive nodes: 0   06/27/2022 Initial Diagnosis   Cancer of descending colon  -Patient was found to have iron deficiency anemia -06/27/2022 colonoscopy showed fungating partially obstructing large mass found in the descending colon.  The mass was circumferential, oozing was present. Biopsy showed invasive adenocarcinoma. -06/27/2022, EGD showed large hiatal hernia, esophagus plaque was found, suspicious for candidiasis.  Cells for cytology obtained.  Normal stomach and examined duodenum. Esophagus pression showed squamous epithelial cells admixed with mixed inflammatory cells and numerous fungal organisms, morphologically consistent with Candida species.  -Dr. Allen Norris has prescribed a course of Diflucan.     07/03/2022 Imaging   CT chest abdomen pelvis with contrast 1. Large irregular annular locally advanced colonic mass centered in the descending colon measuring up to 8.7 x 6.9 x 8.8 cm, with invasion of the pericolonic fat and direct tumor invasion of portions of the left iliopsoas muscle at the level of the superior iliac crest and direct invasion of the left lateral  flank muscle wall. Findings are compatible with primary colonic malignancy. No bowel obstruction. 2. No lymphadenopathy or other definite findings of metastatic disease. 3. Two scattered subcentimeter hypodense liver lesions, too small to accurately characterize, indeterminate. Indeterminate left adrenal 1.3 cm nodule. Further evaluation with MRI abdomen without and with IV contrast may be obtained at this time, as clinically warranted. 4. Two scattered tiny solid left pulmonary nodules, largest 0.3 cm, indeterminate. Recommend attention on follow-up chest CT in 3 months. 5. Large hiatal hernia. Mildly patulous lower thoracic esophagus with small amount of layering oral contrast, suggesting esophageal dysmotility and/or gastroesophageal reflux. 6. Chronic severe T12 vertebral compression fracture. 7. Aberrant nonaneurysmal right subclavian artery. 8.  Aortic Atherosclerosis (ICD10-I70.0).   07/04/2022 Tumor Marker   CEA 9.3   07/10/2022 Imaging   MRI abdomen w wo contrast 1. 11 mm LEFT adrenal lesion is most consistent with an adenoma.Based on current clinical literature, biochemical evaluation to exclude possible functioning adrenal nodule is suggested if not already performed.  2. Locally advanced colonic neoplasm involving musculature in the LEFT lower quadrant including LEFT iliac and oblique insertion sites along the LEFT iliac fossa better demonstrated on recent CT imaging of the chest, abdomen and pelvis. 3. Hepatic cysts.4. Large hiatal hernia, nearly the entire stomach is herniated into the chest.   07/29/2022 Surgery   Patient went left partial colectomy and pelvic wall resection.  Pathology showed A: Colon and abdominal wall, left partial colectomy with resection Invasive colorectal adenocarcinoma, perforated, with associated pericolonic abscess and involving psoas muscle.  Inflammatory response involving left fallopian tube and ovary.  Simple ovarian cyst, tattoo ink, left ptosis and  hyperplastic polyps  B.  Pelvic wall resection, Focal invasive colorectal adenocarcinoma  involving fibroadipose tissue with abscess and inflamed granulation tissue.  Histology grade: G2, moderately differentiated, no LVI, no perineural invasion, tumor budding score low [0-4] All margins negative for invasive carcinoma, high-grade dysplasia/intramucosal carcinoma and low-grade dysplastic. 94 regional lymph nodes were all negative for tumor involvement.  No tumor deposits.     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 The remainder of testing was normal. The report date is 08/24/2022.  The Multi-Cancer + RNA Panel offered by [facility] includes sequencing and/or deletion/duplication analysis of the following 70 genes:  AIP*, ALK, APC*, ATM*, AXIN2*, BAP1*, BARD1*, BLM*, BMPR1A*, BRCA1*, BRCA2*, BRIP1*, CDC73*, CDH1*, CDK4, CDKN1B*, CDKN2A, CHEK2*, CTNNA1*, DICER1*, EPCAM, EGFR, FH*, FLCN*, GREM1, HOXB13, KIT, LZTR1, MAX*, MBD4, MEN1*, MET, MITF, MLH1*, MSH2*, MSH3*, MSH6*, MUTYH*, NF1*, NF2*, NTHL1*, PALB2*, PDGFRA, PMS2*, POLD1*, POLE*, POT1*, PRKAR1A*, PTCH1*, PTEN*, RAD51C*, RAD51D*, RB1*, RET, SDHA*, SDHAF2*, SDHB*, SDHC*, SDHD*, SMAD4*, SMARCA4*, SMARCB1*, SMARCE1*, STK11*, SUFU*, TMEM127*, TP53*, TSC1*, TSC2*, VHL*. RNA analysis is performed for * genes.     FAMILY HISTORY:  We obtained a detailed, 4-generation family history.  Significant diagnoses are listed below: Family History  Problem Relation Age of Onset   Mental illness Mother        Dementia   Stroke Father    Lung cancer Brother    Breast cancer Other        dx under 31   Brain cancer Granddaughter 31       glioblastoma    Kristin Miller had 3 children, 2 daughters and 1 son. One daughter passed in a car accident, the other passed in infancy. Her son is living at 74 and had a daughter who had glioblastoma diagnosed at 19 and  passed at 2. Kristin Miller also has 9 brothers and 4 sisters. One brother had lung cancer. A sister's daughter, patient's niece, had breast cancer under age 93.   Kristin Miller mother died at 31. No known cancers on this side of the family.   Kristin Miller father died at 8 of a stroke, no known cancers on this side of the family.   Kristin Miller is unaware of previous family history of genetic testing for hereditary cancer risks. There is no reported Ashkenazi Jewish ancestry. There is no known consanguinity.    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   The test report has been scanned into EPIC and is located under the Molecular Pathology section of the Results Review tab.  A portion of the result report is included below for reference. Genetic testing reported out on 08/24/2022.       Clinical Information Pathogenic variants in the PMS2 gene are associated with Lynch syndrome.   The cancers associated with PMS2 are: Colorectal cancer, 8.7-20% risk (average age of diagnosis is 95-66) Endometrial cancer, 13-26% risk (average age of diagnosis is 89-50) Ovarian cancer, up to a 3% risk (average age of diagnosis is 28-59) Renal pelvis and/or ureter, up to a 3.7% risk (average age of diagnosis is unknown) Breast, prostate, pancreatic, gastric, small bowel, bladder, biliary tract, and brain cancer risk may be elevated   Management Recommendations:   Colorectal Cancer Screening: High quality colonoscopy at age 31-35 or 2-5 years prior to the earliest colon cancer if it is diagnosed before age 21 and repeat every 1-3 years Studies have demonstrated that the  use of daily aspirin decreases the colorectal cancer risk in patients with Lynch Syndrome. Ongoing studies are investigating the optimal dose and duration of use of aspirin for colon cancer prevention in  Lynch Syndrome. The decision to use aspirin should be made on an individualized basis including a discussion of dose, benefits, and adverse effects.     Endometrial Cancer Screening/Risk Reduction: Women should report any abnormal uterine bleeding or postmenopausal bleeding. The evaluation of these symptoms should include an endometrial biopsy. A hysterectomy may be considered. The timing should be individualized based on whether childbearing is complete, comorbidities, and family history. Endometrial cancer screening does not have a proven benefit in women with Lynch Syndrome. However, endometrial biopsy is highly sensitive and specific as a diagnostic procedure. Screening via endometrial biopsy every 1-2 years starting at age 45-35 can be considered. Transvaginal ultrasounds may be considered in postmenopausal women at their clinician's discretion.   Ovarian Cancer Screening/Risk Reduction: There is currently insufficient evidence to make a specific recommendation for a prophylactic bilateral salpingo-oophorectomy (BSO), or having the ovaries and fallopian tubes removed, for individuals who carry a PMS2 pathogenic variant. Current data does not support routine ovarian screening for Lynch syndrome, therefore it may be considered at the clinician's discretion. Screening includes transvaginal ultrasounds and a blood test to measure CA-125 levels every 6-12 months. If there is a family history of ovarian cancer, have a discussion with your physician about the benefits and limitations of screening and risk reduction strategies.  Women should be aware of symptoms that might be associated with the development of ovarian cancer including pelvic or abdominal pain, bloating, increased abdominal girth, difficulty eating, early satiety, or urinary frequency or urgency. Symptoms that persist for several weeks and are a change from a woman's baseline should prompt evaluation by her physician.   Urothelial Cancer  Screening: Annual urinalysis starting at age 81-35 may be considered in selected individuals such as those with a family history of urothelial cancer (renal pelvis, ureter, and/or bladder).   Gastric and Small Bowel Cancer Screening: While there is no clear evidence to support screening for gastric, duodenal, and small bowel cancer for Lynch Syndrome, select individuals, families, or those of Asian descent may consider esophagogastroduodenoscopy (EGD) with random biopsy of the proximal and distal stomach beginning at 28 and surveillance EDG every 3-5 years   Pancreatic Cancer Screening: Avoid smoking, heavy alcohol use, and obesity. It has been suggested that pancreatic cancer screening be limited to those with a family history of pancreatic cancer (first- or second-degree relative). Ideally, screening should be performed in experienced centers utilizing a multidisciplinary approach under research conditions. Recommended screening include annual endoscopic ultrasound (preferred) and/or MRI of the pancreas starting at age 2 or 76 years younger than the earliest age diagnosis in the family. Annual concurrent CA19-9 testing should also be considered.   Prostate Cancer Screening: Consider beginning annual PSA blood test and digital rectal exams at age 51.   Breast Cancer Screening/Risk Reduction: Breast awareness beginning at age 63 Monthly self-breast examination beginning at age 65 Annual clinical breast examinations beginning at age 61 Annual mammogram beginning at age 23 with consideration of tomosynthesis Evidence is insufficient for a prophylactic risk-reducing mastectomy, manage based on family history     Brain Cancer Screening: Patients should be educated regarding signs and symptoms of neurologic cancer and the importance of prompt reporting of abnormal symptoms to their physicians.   Additional Considerations: Patients of reproductive age should be made aware of options for prenatal  diagnosis and assisted reproduction including pre-implantation genetic diagnosis. Individuals with a single pathogenic PMS2 variant are also carriers of constitutional MMR deficiency (CMMRD) syndrome. CMMRD is a childhood-onset cancer predisposition syndrome that can present with hematological malignancies, cancers of the brain and central nervous system, Lynch syndrome-associated cancers (colon, uterine, small bowel, urinary tract), embryonic tumors, and sarcomas. Some affected individuals may also display cafe-au-lait macules. For there to be a risk of CMMRD in offspring, an individual and their partner would each have to have a single pathogenic variant in the same MMR gene; in such a case, the risk of having an affected child is 25%.   This information is based on current understanding of the gene and may change in the future.   Implications for Family Members: Hereditary predisposition to cancer due to pathogenic variants in the PMS2 gene has autosomal dominant inheritance. This means that an individual with a pathogenic variant has a 50% chance of passing the condition on to his/her offspring. Most cases are inherited from a parent, but some cases may occur spontaneously (i.e., an individual with a pathogenic variant has parents who do not have it). Identification of a pathogenic variant allows for the recognition of at-risk relatives who can pursue testing for the familial variant.   Family members are encouraged to consider genetic testing for this familial pathogenic variant. As there are generally no childhood cancer risks associated with pathogenic variants in the PSM2 gene, individuals in the family are not recommended to have testing until they reach at least 85 years of age. They may contact our office at [phone] for more information or to schedule an appointment.  Complimentary testing for the familial variant is available for 90 days from the report date. Family members who live outside  of the area are encouraged to find a genetic counselor in their area by visiting: [email or web address].  PLAN: 1. These results will be made available to Dr. Tasia Catchings. She would like Dr. Tasia Catchings to follow her long-term for this indication and coordinate any screening that is needed.   2. Kristin Miller plans to discuss these results with her family and will reach out to [place] if we can be of any assistance in coordinating genetic testing for any of her relatives.   Resources FORCE (Facing Our Risk of Cancer Empowered) is a resource for those with a hereditary predisposition to develop cancer.  FORCE provides information about risk reduction, advocacy, legislation, and clinical trials.  Additionally, FORCE provides a platform for collaboration and support which includes: peer navigation, message boards, local support groups, a toll-free helpline, research registry and recruitment, advocate training, published medical research, webinars, brochures, mastectomy photos, and more.  For more information, visit www.facingourrisk.[place].org   Lynch Syndrome [employer]- www.lynchcancers.com   [employer]- www.kintalk.org    We encouraged Kristin Miller to remain in contact with [place] on an annual basis so we can update her personal and family histories, and let her know of advances in cancer genetics that may benefit the family. Our contact number was provided. Kristin Miller questions were answered to her satisfaction today, and she knows she is welcome to call anytime with additional questions.    Faith Rogue, MS, [facility] Genetic Counselor [email or web address] Phone: [phone]

## 2022-08-29 ENCOUNTER — Encounter: Payer: Self-pay | Admitting: Surgery

## 2022-08-29 NOTE — Progress Notes (Signed)
Outpatient Surgical Follow Up  08/29/2022  Kristin Miller is an 87 y.o. female.   Chief Complaint  Patient presents with   Routine Post Op    Partial colectomy 07/29/22    HPI: 87 year old female status post sigmoid colectomy and pelvic wall resection for colon cancer month ago.[id number]  Negative margins.  She is doing well considering her disease.  She is tolerating diet she is ambulating and her colostomy is working well.  No fevers no chills.  sHe looks the best she has ever been for the last couple months.Chronic anemia getting IV iron  Past Medical History:  Diagnosis Date   Allergy    Anemia    H/O AS A CHILD   Arthritis    Back pain    Breast cancer of upper-outer quadrant of right female breast ([place]) 06/12/2015   4 millimeter, T1a, N0; ER 90%, PR 0%, HER-2/neu not overexpressed.  Declined radiation therapy.   Collar bone fracture 05/28/2013   Fractured Collar bone on left   Dyspnea    Dysrhythmia    h/o heart skipping beat years ago   Frequent headaches    h/o migraines   Hypertension    Osteopenia 04/2018   Bone density without significant interval change since 2017.   Pneumonia     Past Surgical History:  Procedure Laterality Date   ABDOMINAL HYSTERECTOMY  1970   Partial   BREAST BIOPSY Right 05/28/2015   INVASIVE MAMMARY CARCINOMA    BREAST BIOPSY Right 06/12/2015   Wide excision, sentinel lymph node biopsy.   BREAST LUMPECTOMY WITH AXILLARY LYMPH NODE DISSECTION Right 06/12/2015   4 mm, T1a,N0 withDCIS with clear margins. ER: 90%, PR 0%; Her 2 neu not overexpressed.    CATARACT EXTRACTION W/PHACO Left 02/03/2017   Procedure: CATARACT EXTRACTION PHACO AND INTRAOCULAR LENS PLACEMENT (IOC);  Surgeon: Birder Robson, MD;  Location: [facility];  Service: Ophthalmology;  Laterality: Left;  [place]  00:28 AP% 16.3 CDE 4.68 Fluid pack lot # [id number]   CATARACT EXTRACTION W/PHACO Right 03/01/2019   Procedure: CATARACT EXTRACTION PHACO AND INTRAOCULAR LENS PLACEMENT ([place])   RIGHT;  Surgeon: Birder Robson, MD;  Location: [place];  Service: Ophthalmology;  Laterality: Right;   COLONOSCOPY     COLONOSCOPY WITH PROPOFOL N/A 06/27/2022   Procedure: COLONOSCOPY WITH PROPOFOL;  Surgeon: [name], MD;  Location: [place];  Service: Endoscopy;  Laterality: N/A;   ESOPHAGOGASTRODUODENOSCOPY (EGD) WITH PROPOFOL N/A 06/27/2022   Procedure: ESOPHAGOGASTRODUODENOSCOPY (EGD) WITH PROPOFOL;  Surgeon: [name], MD;  Location: [place];  Service: Endoscopy;  Laterality: N/A;   PARTIAL COLECTOMY N/A 07/29/2022   Procedure: PARTIAL COLECTOMY, open left, RNFA to assist;  Surgeon: Jules Husbands, MD;  Location: [facility];  Service: General;  Laterality: N/A;    Family History  Problem Relation Age of Onset   Mental illness Mother        Dementia   Stroke Father    Lung cancer Brother    Breast cancer Other        dx under 62   Brain cancer Granddaughter 12       glioblastoma    Social History:  reports that she has never smoked. She has never been exposed to tobacco smoke. She has never used smokeless tobacco. She reports that she does not drink alcohol and does not use drugs.  Allergies:  Allergies  Allergen Reactions   Combigan [Brimonidine Tartrate-Timolol]     Redness, burning, irritation  Lisinopril Cough   Other Rash    SUTURES-(PARTIAL HYSTERECTOMY)    Medications reviewed.    ROS Full ROS performed and is otherwise negative other than what is stated in HPI   BP (!) 148/89   Pulse 90   Temp 97.8 F (36.6 C) (Oral)   Ht 5' (1.524 m)   Wt 121 lb (54.9 kg)   SpO2 97%   BMI 23.63 kg/m   Physical Exam  NAD alert Abd: soft, incision healing well w/o infection., Staples removed  Assessment/Plan: 87 year old female status post sigmoid colectomy and pelvic wall resection for colon cancer.  Negative margins.  She does have significant bulky disease.  She will likely benefit from additional radiation therapy  and she will start that in a few weeks or so.  From a surgical perspective no evidence on complications and she is doing very well considering her age and her advanced and bulky cancer. RTC 2 months    Caroleen Hamman, MD [facility] General Surgeon

## 2022-09-02 DIAGNOSIS — Z510 Encounter for antineoplastic radiation therapy: Secondary | ICD-10-CM | POA: Diagnosis not present

## 2022-09-03 ENCOUNTER — Inpatient Hospital Stay: Payer: Medicare HMO

## 2022-09-03 VITALS — BP 145/64 | HR 79 | Temp 96.4°F

## 2022-09-03 DIAGNOSIS — Z510 Encounter for antineoplastic radiation therapy: Secondary | ICD-10-CM | POA: Diagnosis not present

## 2022-09-03 DIAGNOSIS — C189 Malignant neoplasm of colon, unspecified: Secondary | ICD-10-CM

## 2022-09-03 DIAGNOSIS — D500 Iron deficiency anemia secondary to blood loss (chronic): Secondary | ICD-10-CM

## 2022-09-03 MED ORDER — SODIUM CHLORIDE 0.9 % IV SOLN
200.0000 mg | Freq: Once | INTRAVENOUS | Status: AC
  Administered 2022-09-03: 200 mg via INTRAVENOUS
  Filled 2022-09-03: qty 200

## 2022-09-03 MED ORDER — SODIUM CHLORIDE 0.9% FLUSH
10.0000 mL | Freq: Once | INTRAVENOUS | Status: AC | PRN
  Administered 2022-09-03: 10 mL
  Filled 2022-09-03: qty 10

## 2022-09-03 MED ORDER — SODIUM CHLORIDE 0.9 % IV SOLN
Freq: Once | INTRAVENOUS | Status: AC
  Filled 2022-09-03: qty 250

## 2022-09-04 ENCOUNTER — Ambulatory Visit
Admission: RE | Admit: 2022-09-04 | Discharge: 2022-09-04 | Disposition: A | Discharge status: Home / Self Care | Payer: Medicare HMO | Source: Ambulatory Visit | Attending: Radiation Oncology | Admitting: Radiation Oncology

## 2022-09-04 DIAGNOSIS — Z510 Encounter for antineoplastic radiation therapy: Secondary | ICD-10-CM | POA: Diagnosis not present

## 2022-09-08 ENCOUNTER — Other Ambulatory Visit: Payer: Self-pay

## 2022-09-08 ENCOUNTER — Ambulatory Visit
Admission: RE | Admit: 2022-09-08 | Discharge: 2022-09-08 | Disposition: A | Discharge status: Home / Self Care | Payer: Medicare HMO | Source: Ambulatory Visit | Attending: Radiation Oncology | Admitting: Radiation Oncology

## 2022-09-08 DIAGNOSIS — Z510 Encounter for antineoplastic radiation therapy: Secondary | ICD-10-CM | POA: Diagnosis not present

## 2022-09-08 LAB — RAD ONC ARIA SESSION SUMMARY
Course Elapsed Days: 0
Plan Fractions Treated to Date: 1
Plan Prescribed Dose Per Fraction: 1.8 Gy
Plan Total Fractions Prescribed: 25
Plan Total Prescribed Dose: 45 Gy
Reference Point Dosage Given to Date: 1.8 Gy
Reference Point Session Dosage Given: 1.8 Gy
Session Number: 1

## 2022-09-09 ENCOUNTER — Ambulatory Visit
Admission: RE | Admit: 2022-09-09 | Discharge: 2022-09-09 | Disposition: A | Discharge status: Home / Self Care | Payer: Medicare HMO | Source: Ambulatory Visit | Attending: Radiation Oncology | Admitting: Radiation Oncology

## 2022-09-09 ENCOUNTER — Other Ambulatory Visit: Payer: Self-pay

## 2022-09-09 ENCOUNTER — Telehealth: Payer: Self-pay

## 2022-09-09 DIAGNOSIS — C189 Malignant neoplasm of colon, unspecified: Secondary | ICD-10-CM

## 2022-09-09 DIAGNOSIS — Z510 Encounter for antineoplastic radiation therapy: Secondary | ICD-10-CM | POA: Diagnosis not present

## 2022-09-09 LAB — RAD ONC ARIA SESSION SUMMARY
Course Elapsed Days: 1
Plan Fractions Treated to Date: 2
Plan Prescribed Dose Per Fraction: 1.8 Gy
Plan Total Fractions Prescribed: 25
Plan Total Prescribed Dose: 45 Gy
Reference Point Dosage Given to Date: 3.6 Gy
Reference Point Session Dosage Given: 1.8 Gy
Session Number: 2

## 2022-09-09 NOTE — Telephone Encounter (Signed)
Order for Signatera testing was placed in EPIC, with Krisit's assistance.  Pt aware that blood will be drawn at tomorrow's lab appt. Steffanie Dunn will fax req form to [facility] path and [place].

## 2022-09-10 ENCOUNTER — Other Ambulatory Visit: Payer: Self-pay

## 2022-09-10 ENCOUNTER — Ambulatory Visit
Admission: RE | Admit: 2022-09-10 | Discharge: 2022-09-10 | Disposition: A | Discharge status: Home / Self Care | Payer: Medicare HMO | Source: Ambulatory Visit | Attending: Radiation Oncology | Admitting: Radiation Oncology

## 2022-09-10 DIAGNOSIS — Z510 Encounter for antineoplastic radiation therapy: Secondary | ICD-10-CM | POA: Diagnosis not present

## 2022-09-10 LAB — RAD ONC ARIA SESSION SUMMARY
Course Elapsed Days: 2
Plan Fractions Treated to Date: 3
Plan Prescribed Dose Per Fraction: 1.8 Gy
Plan Total Fractions Prescribed: 25
Plan Total Prescribed Dose: 45 Gy
Reference Point Dosage Given to Date: 5.4 Gy
Reference Point Session Dosage Given: 1.8 Gy
Session Number: 3

## 2022-09-10 MED FILL — Iron Sucrose Inj 20 MG/ML (Fe Equiv): INTRAVENOUS | Qty: 10 | Status: AC

## 2022-09-11 ENCOUNTER — Ambulatory Visit
Admission: RE | Admit: 2022-09-11 | Discharge: 2022-09-11 | Disposition: A | Discharge status: Home / Self Care | Payer: Medicare HMO | Source: Ambulatory Visit | Attending: Radiation Oncology | Admitting: Radiation Oncology

## 2022-09-11 ENCOUNTER — Inpatient Hospital Stay: Payer: Medicare HMO

## 2022-09-11 ENCOUNTER — Other Ambulatory Visit: Payer: Self-pay

## 2022-09-11 DIAGNOSIS — C189 Malignant neoplasm of colon, unspecified: Secondary | ICD-10-CM

## 2022-09-11 DIAGNOSIS — C186 Malignant neoplasm of descending colon: Secondary | ICD-10-CM

## 2022-09-11 DIAGNOSIS — Z510 Encounter for antineoplastic radiation therapy: Secondary | ICD-10-CM | POA: Diagnosis not present

## 2022-09-11 LAB — RAD ONC ARIA SESSION SUMMARY
Course Elapsed Days: 3
Plan Fractions Treated to Date: 4
Plan Prescribed Dose Per Fraction: 1.8 Gy
Plan Total Fractions Prescribed: 25
Plan Total Prescribed Dose: 45 Gy
Reference Point Dosage Given to Date: 7.2 Gy
Reference Point Session Dosage Given: 1.8 Gy
Session Number: 4

## 2022-09-11 LAB — CBC
HCT: 38.7 % (ref 36.0–46.0)
Hemoglobin: 12.1 g/dL (ref 12.0–15.0)
MCH: 26.6 pg (ref 26.0–34.0)
MCHC: 31.3 g/dL (ref 30.0–36.0)
MCV: 85.1 fL (ref 80.0–100.0)
Platelets: 319 10*3/uL (ref 150–400)
RBC: 4.55 MIL/uL (ref 3.87–5.11)
RDW: 19.9 % — ABNORMAL HIGH (ref 11.5–15.5)
WBC: 8.8 10*3/uL (ref 4.0–10.5)
nRBC: 0 % (ref 0.0–0.2)

## 2022-09-11 LAB — GENETIC SCREENING ORDER

## 2022-09-12 ENCOUNTER — Ambulatory Visit
Admission: RE | Admit: 2022-09-12 | Discharge: 2022-09-12 | Disposition: A | Discharge status: Home / Self Care | Payer: Medicare HMO | Source: Ambulatory Visit | Attending: Radiation Oncology | Admitting: Radiation Oncology

## 2022-09-12 ENCOUNTER — Other Ambulatory Visit: Payer: Self-pay

## 2022-09-12 DIAGNOSIS — Z510 Encounter for antineoplastic radiation therapy: Secondary | ICD-10-CM | POA: Diagnosis not present

## 2022-09-12 LAB — RAD ONC ARIA SESSION SUMMARY
Course Elapsed Days: 4
Plan Fractions Treated to Date: 5
Plan Prescribed Dose Per Fraction: 1.8 Gy
Plan Total Fractions Prescribed: 25
Plan Total Prescribed Dose: 45 Gy
Reference Point Dosage Given to Date: 9 Gy
Reference Point Session Dosage Given: 1.8 Gy
Session Number: 5

## 2022-09-15 ENCOUNTER — Ambulatory Visit
Admission: RE | Admit: 2022-09-15 | Discharge: 2022-09-15 | Disposition: A | Discharge status: Home / Self Care | Payer: Medicare HMO | Source: Ambulatory Visit | Attending: Radiation Oncology | Admitting: Radiation Oncology

## 2022-09-15 ENCOUNTER — Other Ambulatory Visit: Payer: Self-pay

## 2022-09-15 DIAGNOSIS — Z510 Encounter for antineoplastic radiation therapy: Secondary | ICD-10-CM | POA: Diagnosis not present

## 2022-09-15 LAB — RAD ONC ARIA SESSION SUMMARY
Course Elapsed Days: 7
Plan Fractions Treated to Date: 6
Plan Prescribed Dose Per Fraction: 1.8 Gy
Plan Total Fractions Prescribed: 25
Plan Total Prescribed Dose: 45 Gy
Reference Point Dosage Given to Date: 10.8 Gy
Reference Point Session Dosage Given: 1.8 Gy
Session Number: 6

## 2022-09-16 ENCOUNTER — Other Ambulatory Visit: Payer: Self-pay

## 2022-09-16 ENCOUNTER — Ambulatory Visit
Admission: RE | Admit: 2022-09-16 | Discharge: 2022-09-16 | Disposition: A | Discharge status: Home / Self Care | Payer: Medicare HMO | Source: Ambulatory Visit | Attending: Radiation Oncology | Admitting: Radiation Oncology

## 2022-09-16 ENCOUNTER — Inpatient Hospital Stay: Payer: Medicare HMO

## 2022-09-16 DIAGNOSIS — D500 Iron deficiency anemia secondary to blood loss (chronic): Secondary | ICD-10-CM

## 2022-09-16 DIAGNOSIS — Z510 Encounter for antineoplastic radiation therapy: Secondary | ICD-10-CM | POA: Diagnosis not present

## 2022-09-16 LAB — CBC WITH DIFFERENTIAL/PLATELET
Abs Immature Granulocytes: 0.04 10*3/uL (ref 0.00–0.07)
Basophils Absolute: 0.1 10*3/uL (ref 0.0–0.1)
Basophils Relative: 1 %
Eosinophils Absolute: 0.2 10*3/uL (ref 0.0–0.5)
Eosinophils Relative: 3 %
HCT: 36.7 % (ref 36.0–46.0)
Hemoglobin: 11.3 g/dL — ABNORMAL LOW (ref 12.0–15.0)
Immature Granulocytes: 1 %
Lymphocytes Relative: 19 %
Lymphs Abs: 1.4 10*3/uL (ref 0.7–4.0)
MCH: 26.5 pg (ref 26.0–34.0)
MCHC: 30.8 g/dL (ref 30.0–36.0)
MCV: 86.2 fL (ref 80.0–100.0)
Monocytes Absolute: 0.7 10*3/uL (ref 0.1–1.0)
Monocytes Relative: 10 %
Neutro Abs: 4.9 10*3/uL (ref 1.7–7.7)
Neutrophils Relative %: 66 %
Platelets: 286 10*3/uL (ref 150–400)
RBC: 4.26 MIL/uL (ref 3.87–5.11)
RDW: 19.3 % — ABNORMAL HIGH (ref 11.5–15.5)
WBC: 7.4 10*3/uL (ref 4.0–10.5)
nRBC: 0 % (ref 0.0–0.2)

## 2022-09-16 LAB — RAD ONC ARIA SESSION SUMMARY
Course Elapsed Days: 8
Plan Fractions Treated to Date: 7
Plan Prescribed Dose Per Fraction: 1.8 Gy
Plan Total Fractions Prescribed: 25
Plan Total Prescribed Dose: 45 Gy
Reference Point Dosage Given to Date: 12.6 Gy
Reference Point Session Dosage Given: 1.8 Gy
Session Number: 7

## 2022-09-16 LAB — RETIC PANEL
Immature Retic Fract: 7.3 % (ref 2.3–15.9)
RBC.: 4.23 MIL/uL (ref 3.87–5.11)
Retic Count, Absolute: 67.7 10*3/uL (ref 19.0–186.0)
Retic Ct Pct: 1.6 % (ref 0.4–3.1)
Reticulocyte Hemoglobin: 32.5 pg (ref >27.9)

## 2022-09-16 LAB — IRON AND TIBC
Iron: 84 ug/dL (ref 28–170)
Saturation Ratios: 30 % (ref 10.4–31.8)
TIBC: 277 ug/dL (ref 250–450)
UIBC: 193 ug/dL

## 2022-09-16 LAB — FERRITIN: Ferritin: 236 ng/mL (ref 11–307)

## 2022-09-17 ENCOUNTER — Ambulatory Visit
Admission: RE | Admit: 2022-09-17 | Discharge: 2022-09-17 | Disposition: A | Discharge status: Home / Self Care | Payer: Medicare HMO | Source: Ambulatory Visit | Attending: Radiation Oncology | Admitting: Radiation Oncology

## 2022-09-17 ENCOUNTER — Other Ambulatory Visit: Payer: Self-pay

## 2022-09-17 DIAGNOSIS — Z510 Encounter for antineoplastic radiation therapy: Secondary | ICD-10-CM | POA: Diagnosis not present

## 2022-09-17 LAB — RAD ONC ARIA SESSION SUMMARY
Course Elapsed Days: 9
Plan Fractions Treated to Date: 8
Plan Prescribed Dose Per Fraction: 1.8 Gy
Plan Total Fractions Prescribed: 25
Plan Total Prescribed Dose: 45 Gy
Reference Point Dosage Given to Date: 14.4 Gy
Reference Point Session Dosage Given: 1.8 Gy
Session Number: 8

## 2022-09-18 ENCOUNTER — Inpatient Hospital Stay: Payer: Medicare HMO

## 2022-09-18 ENCOUNTER — Other Ambulatory Visit: Payer: Self-pay

## 2022-09-18 ENCOUNTER — Ambulatory Visit
Admission: RE | Admit: 2022-09-18 | Discharge: 2022-09-18 | Disposition: A | Discharge status: Home / Self Care | Payer: Medicare HMO | Source: Ambulatory Visit | Attending: Radiation Oncology | Admitting: Radiation Oncology

## 2022-09-18 DIAGNOSIS — Z510 Encounter for antineoplastic radiation therapy: Secondary | ICD-10-CM | POA: Diagnosis not present

## 2022-09-18 DIAGNOSIS — C186 Malignant neoplasm of descending colon: Secondary | ICD-10-CM

## 2022-09-18 LAB — CBC
HCT: 39.7 % (ref 36.0–46.0)
Hemoglobin: 12.1 g/dL (ref 12.0–15.0)
MCH: 26.6 pg (ref 26.0–34.0)
MCHC: 30.5 g/dL (ref 30.0–36.0)
MCV: 87.3 fL (ref 80.0–100.0)
Platelets: 288 10*3/uL (ref 150–400)
RBC: 4.55 MIL/uL (ref 3.87–5.11)
RDW: 19 % — ABNORMAL HIGH (ref 11.5–15.5)
WBC: 8.9 10*3/uL (ref 4.0–10.5)
nRBC: 0 % (ref 0.0–0.2)

## 2022-09-18 LAB — RAD ONC ARIA SESSION SUMMARY
Course Elapsed Days: 10
Plan Fractions Treated to Date: 9
Plan Prescribed Dose Per Fraction: 1.8 Gy
Plan Total Fractions Prescribed: 25
Plan Total Prescribed Dose: 45 Gy
Reference Point Dosage Given to Date: 16.2 Gy
Reference Point Session Dosage Given: 1.8 Gy
Session Number: 9

## 2022-09-19 ENCOUNTER — Ambulatory Visit
Admission: RE | Admit: 2022-09-19 | Discharge: 2022-09-19 | Disposition: A | Discharge status: Home / Self Care | Payer: Medicare HMO | Source: Ambulatory Visit | Attending: Radiation Oncology | Admitting: Radiation Oncology

## 2022-09-19 ENCOUNTER — Other Ambulatory Visit: Payer: Self-pay

## 2022-09-19 DIAGNOSIS — Z853 Personal history of malignant neoplasm of breast: Secondary | ICD-10-CM | POA: Insufficient documentation

## 2022-09-19 DIAGNOSIS — C186 Malignant neoplasm of descending colon: Secondary | ICD-10-CM | POA: Diagnosis present

## 2022-09-19 DIAGNOSIS — Z510 Encounter for antineoplastic radiation therapy: Secondary | ICD-10-CM | POA: Insufficient documentation

## 2022-09-19 DIAGNOSIS — D509 Iron deficiency anemia, unspecified: Secondary | ICD-10-CM | POA: Diagnosis not present

## 2022-09-19 LAB — RAD ONC ARIA SESSION SUMMARY
Course Elapsed Days: 11
Plan Fractions Treated to Date: 10
Plan Prescribed Dose Per Fraction: 1.8 Gy
Plan Total Fractions Prescribed: 25
Plan Total Prescribed Dose: 45 Gy
Reference Point Dosage Given to Date: 18 Gy
Reference Point Session Dosage Given: 1.8 Gy
Session Number: 10

## 2022-09-22 ENCOUNTER — Other Ambulatory Visit: Payer: Self-pay

## 2022-09-22 ENCOUNTER — Ambulatory Visit
Admission: RE | Admit: 2022-09-22 | Discharge: 2022-09-22 | Disposition: A | Discharge status: Home / Self Care | Payer: Medicare HMO | Source: Ambulatory Visit | Attending: Radiation Oncology | Admitting: Radiation Oncology

## 2022-09-22 DIAGNOSIS — Z510 Encounter for antineoplastic radiation therapy: Secondary | ICD-10-CM | POA: Diagnosis not present

## 2022-09-22 LAB — RAD ONC ARIA SESSION SUMMARY
Course Elapsed Days: 14
Plan Fractions Treated to Date: 11
Plan Prescribed Dose Per Fraction: 1.8 Gy
Plan Total Fractions Prescribed: 25
Plan Total Prescribed Dose: 45 Gy
Reference Point Dosage Given to Date: 19.8 Gy
Reference Point Session Dosage Given: 1.8 Gy
Session Number: 11

## 2022-09-23 ENCOUNTER — Other Ambulatory Visit: Payer: Self-pay

## 2022-09-23 ENCOUNTER — Ambulatory Visit
Admission: RE | Admit: 2022-09-23 | Discharge: 2022-09-23 | Disposition: A | Discharge status: Home / Self Care | Payer: Medicare HMO | Source: Ambulatory Visit | Attending: Radiation Oncology | Admitting: Radiation Oncology

## 2022-09-23 DIAGNOSIS — Z510 Encounter for antineoplastic radiation therapy: Secondary | ICD-10-CM | POA: Diagnosis not present

## 2022-09-23 LAB — RAD ONC ARIA SESSION SUMMARY
Course Elapsed Days: 15
Plan Fractions Treated to Date: 12
Plan Prescribed Dose Per Fraction: 1.8 Gy
Plan Total Fractions Prescribed: 25
Plan Total Prescribed Dose: 45 Gy
Reference Point Dosage Given to Date: 21.6 Gy
Reference Point Session Dosage Given: 1.8 Gy
Session Number: 12

## 2022-09-24 ENCOUNTER — Ambulatory Visit
Admission: RE | Admit: 2022-09-24 | Discharge: 2022-09-24 | Disposition: A | Discharge status: Home / Self Care | Payer: Medicare HMO | Source: Ambulatory Visit | Attending: Radiation Oncology | Admitting: Radiation Oncology

## 2022-09-24 ENCOUNTER — Other Ambulatory Visit: Payer: Self-pay

## 2022-09-24 DIAGNOSIS — Z510 Encounter for antineoplastic radiation therapy: Secondary | ICD-10-CM | POA: Diagnosis not present

## 2022-09-24 LAB — RAD ONC ARIA SESSION SUMMARY
Course Elapsed Days: 16
Plan Fractions Treated to Date: 13
Plan Prescribed Dose Per Fraction: 1.8 Gy
Plan Total Fractions Prescribed: 25
Plan Total Prescribed Dose: 45 Gy
Reference Point Dosage Given to Date: 23.4 Gy
Reference Point Session Dosage Given: 1.8 Gy
Session Number: 13

## 2022-09-25 ENCOUNTER — Inpatient Hospital Stay: Payer: Medicare HMO

## 2022-09-25 ENCOUNTER — Ambulatory Visit
Admission: RE | Admit: 2022-09-25 | Discharge: 2022-09-25 | Disposition: A | Discharge status: Home / Self Care | Payer: Medicare HMO | Source: Ambulatory Visit | Attending: Radiation Oncology | Admitting: Radiation Oncology

## 2022-09-25 ENCOUNTER — Other Ambulatory Visit: Payer: Self-pay

## 2022-09-25 DIAGNOSIS — Z510 Encounter for antineoplastic radiation therapy: Secondary | ICD-10-CM | POA: Diagnosis not present

## 2022-09-25 DIAGNOSIS — Z853 Personal history of malignant neoplasm of breast: Secondary | ICD-10-CM | POA: Insufficient documentation

## 2022-09-25 DIAGNOSIS — D509 Iron deficiency anemia, unspecified: Secondary | ICD-10-CM | POA: Insufficient documentation

## 2022-09-25 DIAGNOSIS — C186 Malignant neoplasm of descending colon: Secondary | ICD-10-CM

## 2022-09-25 LAB — CBC
HCT: 38.7 % (ref 36.0–46.0)
Hemoglobin: 12.2 g/dL (ref 12.0–15.0)
MCH: 26.9 pg (ref 26.0–34.0)
MCHC: 31.5 g/dL (ref 30.0–36.0)
MCV: 85.4 fL (ref 80.0–100.0)
Platelets: 271 10*3/uL (ref 150–400)
RBC: 4.53 MIL/uL (ref 3.87–5.11)
RDW: 18.3 % — ABNORMAL HIGH (ref 11.5–15.5)
WBC: 6.3 10*3/uL (ref 4.0–10.5)
nRBC: 0 % (ref 0.0–0.2)

## 2022-09-25 LAB — SIGNATERA
SIGNATERA MTM READOUT: 0.06 MTM/ml — AB
SIGNATERA TEST RESULT: POSITIVE — AB

## 2022-09-25 LAB — RAD ONC ARIA SESSION SUMMARY
Course Elapsed Days: 17
Plan Fractions Treated to Date: 14
Plan Prescribed Dose Per Fraction: 1.8 Gy
Plan Total Fractions Prescribed: 25
Plan Total Prescribed Dose: 45 Gy
Reference Point Dosage Given to Date: 25.2 Gy
Reference Point Session Dosage Given: 1.8 Gy
Session Number: 14

## 2022-09-26 ENCOUNTER — Encounter: Payer: Self-pay | Admitting: Oncology

## 2022-09-26 ENCOUNTER — Ambulatory Visit
Admission: RE | Admit: 2022-09-26 | Discharge: 2022-09-26 | Disposition: A | Discharge status: Home / Self Care | Payer: Medicare HMO | Source: Ambulatory Visit | Attending: Radiation Oncology | Admitting: Radiation Oncology

## 2022-09-26 ENCOUNTER — Other Ambulatory Visit: Payer: Self-pay

## 2022-09-26 DIAGNOSIS — Z510 Encounter for antineoplastic radiation therapy: Secondary | ICD-10-CM | POA: Diagnosis not present

## 2022-09-26 LAB — RAD ONC ARIA SESSION SUMMARY
Course Elapsed Days: 18
Plan Fractions Treated to Date: 15
Plan Prescribed Dose Per Fraction: 1.8 Gy
Plan Total Fractions Prescribed: 25
Plan Total Prescribed Dose: 45 Gy
Reference Point Dosage Given to Date: 27 Gy
Reference Point Session Dosage Given: 1.8 Gy
Session Number: 15

## 2022-09-29 ENCOUNTER — Ambulatory Visit
Admission: RE | Admit: 2022-09-29 | Discharge: 2022-09-29 | Disposition: A | Discharge status: Home / Self Care | Payer: Medicare HMO | Source: Ambulatory Visit | Attending: Radiation Oncology | Admitting: Radiation Oncology

## 2022-09-29 ENCOUNTER — Other Ambulatory Visit: Payer: Self-pay

## 2022-09-29 ENCOUNTER — Telehealth: Payer: Self-pay | Admitting: Oncology

## 2022-09-29 DIAGNOSIS — Z510 Encounter for antineoplastic radiation therapy: Secondary | ICD-10-CM | POA: Diagnosis not present

## 2022-09-29 LAB — RAD ONC ARIA SESSION SUMMARY
Course Elapsed Days: 21
Plan Fractions Treated to Date: 16
Plan Prescribed Dose Per Fraction: 1.8 Gy
Plan Total Fractions Prescribed: 25
Plan Total Prescribed Dose: 45 Gy
Reference Point Dosage Given to Date: 28.8 Gy
Reference Point Session Dosage Given: 1.8 Gy
Session Number: 16

## 2022-09-29 NOTE — Telephone Encounter (Signed)
I called patient and communicate with her about positive Signatera results.  I discussed about option of adjuvant chemotherapy [ 5-FU infusion vs Xeloda}. Patient would like to discuss with family member and call back to update me her decision.

## 2022-09-30 ENCOUNTER — Ambulatory Visit
Admission: RE | Admit: 2022-09-30 | Discharge: 2022-09-30 | Disposition: A | Discharge status: Home / Self Care | Payer: Medicare HMO | Source: Ambulatory Visit | Attending: Radiation Oncology | Admitting: Radiation Oncology

## 2022-09-30 ENCOUNTER — Other Ambulatory Visit: Payer: Self-pay

## 2022-09-30 DIAGNOSIS — Z510 Encounter for antineoplastic radiation therapy: Secondary | ICD-10-CM | POA: Diagnosis not present

## 2022-09-30 LAB — RAD ONC ARIA SESSION SUMMARY
Course Elapsed Days: 22
Plan Fractions Treated to Date: 17
Plan Prescribed Dose Per Fraction: 1.8 Gy
Plan Total Fractions Prescribed: 25
Plan Total Prescribed Dose: 45 Gy
Reference Point Dosage Given to Date: 30.6 Gy
Reference Point Session Dosage Given: 1.8 Gy
Session Number: 17

## 2022-10-01 ENCOUNTER — Other Ambulatory Visit: Payer: Self-pay

## 2022-10-01 ENCOUNTER — Ambulatory Visit
Admission: RE | Admit: 2022-10-01 | Discharge: 2022-10-01 | Disposition: A | Discharge status: Home / Self Care | Payer: Medicare HMO | Source: Ambulatory Visit | Attending: Radiation Oncology | Admitting: Radiation Oncology

## 2022-10-01 DIAGNOSIS — Z510 Encounter for antineoplastic radiation therapy: Secondary | ICD-10-CM | POA: Diagnosis not present

## 2022-10-01 LAB — RAD ONC ARIA SESSION SUMMARY
Course Elapsed Days: 23
Plan Fractions Treated to Date: 18
Plan Prescribed Dose Per Fraction: 1.8 Gy
Plan Total Fractions Prescribed: 25
Plan Total Prescribed Dose: 45 Gy
Reference Point Dosage Given to Date: 32.4 Gy
Reference Point Session Dosage Given: 1.8 Gy
Session Number: 18

## 2022-10-02 ENCOUNTER — Other Ambulatory Visit: Payer: Self-pay

## 2022-10-02 ENCOUNTER — Ambulatory Visit
Admission: RE | Admit: 2022-10-02 | Discharge: 2022-10-02 | Disposition: A | Discharge status: Home / Self Care | Payer: Medicare HMO | Source: Ambulatory Visit | Attending: Radiation Oncology | Admitting: Radiation Oncology

## 2022-10-02 ENCOUNTER — Inpatient Hospital Stay: Payer: Medicare HMO

## 2022-10-02 DIAGNOSIS — C186 Malignant neoplasm of descending colon: Secondary | ICD-10-CM

## 2022-10-02 DIAGNOSIS — Z510 Encounter for antineoplastic radiation therapy: Secondary | ICD-10-CM | POA: Diagnosis not present

## 2022-10-02 LAB — RAD ONC ARIA SESSION SUMMARY
Course Elapsed Days: 24
Plan Fractions Treated to Date: 19
Plan Prescribed Dose Per Fraction: 1.8 Gy
Plan Total Fractions Prescribed: 25
Plan Total Prescribed Dose: 45 Gy
Reference Point Dosage Given to Date: 34.2 Gy
Reference Point Session Dosage Given: 1.8 Gy
Session Number: 19

## 2022-10-02 LAB — CBC
HCT: 39.8 % (ref 36.0–46.0)
Hemoglobin: 13 g/dL (ref 12.0–15.0)
MCH: 28.1 pg (ref 26.0–34.0)
MCHC: 32.7 g/dL (ref 30.0–36.0)
MCV: 86.1 fL (ref 80.0–100.0)
Platelets: 287 10*3/uL (ref 150–400)
RBC: 4.62 MIL/uL (ref 3.87–5.11)
RDW: 18.1 % — ABNORMAL HIGH (ref 11.5–15.5)
WBC: 8.7 10*3/uL (ref 4.0–10.5)
nRBC: 0 % (ref 0.0–0.2)

## 2022-10-03 ENCOUNTER — Encounter: Payer: Self-pay | Admitting: Oncology

## 2022-10-03 ENCOUNTER — Other Ambulatory Visit: Payer: Self-pay

## 2022-10-03 ENCOUNTER — Inpatient Hospital Stay (HOSPITAL_BASED_OUTPATIENT_CLINIC_OR_DEPARTMENT_OTHER): Payer: Medicare HMO | Admitting: Oncology

## 2022-10-03 ENCOUNTER — Ambulatory Visit
Admission: RE | Admit: 2022-10-03 | Discharge: 2022-10-03 | Disposition: A | Discharge status: Home / Self Care | Payer: Medicare HMO | Source: Ambulatory Visit | Attending: Radiation Oncology | Admitting: Radiation Oncology

## 2022-10-03 VITALS — BP 151/69 | HR 86 | Temp 96.3°F | Resp 18 | Wt 127.9 lb

## 2022-10-03 DIAGNOSIS — D508 Other iron deficiency anemias: Secondary | ICD-10-CM | POA: Diagnosis not present

## 2022-10-03 DIAGNOSIS — C189 Malignant neoplasm of colon, unspecified: Secondary | ICD-10-CM

## 2022-10-03 DIAGNOSIS — C50511 Malignant neoplasm of lower-outer quadrant of right female breast: Secondary | ICD-10-CM

## 2022-10-03 DIAGNOSIS — Z510 Encounter for antineoplastic radiation therapy: Secondary | ICD-10-CM | POA: Diagnosis not present

## 2022-10-03 DIAGNOSIS — Z1509 Genetic susceptibility to other malignant neoplasm: Secondary | ICD-10-CM | POA: Diagnosis not present

## 2022-10-03 LAB — RAD ONC ARIA SESSION SUMMARY
Course Elapsed Days: 25
Plan Fractions Treated to Date: 20
Plan Prescribed Dose Per Fraction: 1.8 Gy
Plan Total Fractions Prescribed: 25
Plan Total Prescribed Dose: 45 Gy
Reference Point Dosage Given to Date: 36 Gy
Reference Point Session Dosage Given: 1.8 Gy
Session Number: 20

## 2022-10-03 NOTE — Assessment & Plan Note (Addendum)
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  Increased risk of developing colon cancer, ovarian cancer, endometrial cancer, etc.  Patient has had hysterectomy.  Recommend first-degree relatives to be screened. Patient has discussed genetic testing results with genetic counselor.

## 2022-10-03 NOTE — Progress Notes (Signed)
Hematology/Oncology Consult Note [phone] [phone]    CHIEF COMPLAINTS/PURPOSE OF CONSULTATION:  Descending colon cancer  ASSESSMENT & PLAN:   Cancer Staging  Malignant neoplasm of colon [facility]) Staging form: Colon and Rectum, AJCC 8th Edition - Clinical stage from 06/27/2022: Stage IIC (cT4b, cN0, cM0) - Signed by Earlie Server, MD on 07/05/2022 - Pathologic stage from 07/29/2022: Stage IIC (pT4b, pN0, cM0) - Signed by Earlie Server, MD on 10/03/2022   Malignant neoplasm of colon [facility]) Pathology results are reviewed and discussed with patient. 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  Benefit of adjuvant 5-FU monotherapy in stage II dMMR patients  is uncertain. With her 87 years, I most likely will not offer chemotherapy.  Signatera ctDNA came back positive. Small lung nodules, too small to characterized- attention on follow up CT T4b disease, continue follow-up with radiation oncology to finish radiation.  I had a lengthy discussion with patient and her family member.  We discussed about option of adjuvant chemotherapy due to positive/detectable Signatera ct DNA.  Rationale and potential side effects of 5-FU continuous infusion every 2 weeks for 3 to 6 months were reviewed and discussed with patient. Patient declined chemotherapy.  She prefers to be observed and follow-up lab and CT scan.  I plan to repeat CT scan 3-4 months after his surgery.  We discussed about utility of Signatera repeat testing to follow-up on ct DNA levels.  Patient expressed her desires of not having progressive chemotherapy in the future even after disease recurrence/development of stage IV disease.  Shared decision was made to not to repeat further Signatera testing.  Iron deficiency anemia Finish IV Venofer treatments.  Will repeat levels at the next visit.  Primary cancer of lower-outer quadrant of right breast ([place]) 4 millimeter, T1a, N0; ER  90%, PR 0%, HER-2/neu negative. Declined radiation therapy.  She finished 6 years of Letrozole, previously managed by Dr. Bary Castilla.   Monoallelic mutation of PMS2 gene Patient has discussed genetic testing results with genetic counselor.  Orders Placed This Encounter  Procedures   CT CHEST ABDOMEN PELVIS W CONTRAST    Standing Status:   Future    Standing Expiration Date:   10/03/2023    Order Specific Question:   If indicated for the ordered procedure, I authorize the administration of contrast media per Radiology protocol    Answer:   Yes    Order Specific Question:   Does the patient have a contrast media/X-ray dye allergy?    Answer:   No    Order Specific Question:   Preferred imaging location?    Answer:   [facility]    Order Specific Question:   Is Oral Contrast requested for this exam?    Answer:   Yes, Per Radiology protocol   CBC with Differential ([place] Only)    Standing Status:   Future    Standing Expiration Date:   10/03/2023   CMP ([place] only)    Standing Status:   Future    Standing Expiration Date:   10/03/2023   CEA    Standing Status:   Future    Standing Expiration Date:   10/03/2023   Follow up in 3 months.  All questions were answered. The patient knows to call the clinic with any problems, questions or concerns.  Earlie Server, MD, PhD [facility] Hematology Oncology 10/03/2022   HISTORY OF PRESENTING ILLNESS:  Kristin Miller [address]  87-year-old female presents to establish care for descending colon cancer I have reviewed her chart and materials related to her cancer extensively and collaborated history with the patient. Summary of oncologic history is as follows: Oncology History  Malignant neoplasm of colon ([place])  06/27/2022 Cancer Staging   Staging form: Colon and Rectum, AJCC 8th Edition - Clinical stage from 06/27/2022: Stage IIC (cT4b, cN0, cM0) - Signed by Earlie Server, MD on 07/05/2022 Stage prefix: Initial diagnosis Total positive nodes: 0    06/27/2022 Initial Diagnosis   Cancer of descending colon  -Patient was found to have iron deficiency anemia -06/27/2022 colonoscopy showed fungating partially obstructing large mass found in the descending colon.  The mass was circumferential, oozing was present. Biopsy showed invasive adenocarcinoma. -06/27/2022, EGD showed large hiatal hernia, esophagus plaque was found, suspicious for candidiasis.  Cells for cytology obtained.  Normal stomach and examined duodenum. Esophagus pression showed squamous epithelial cells admixed with mixed inflammatory cells and numerous fungal organisms, morphologically consistent with Candida species.  -Dr. Allen Norris has prescribed a course of Diflucan.     07/03/2022 Imaging   CT chest abdomen pelvis with contrast 1. Large irregular annular locally advanced colonic mass centered in the descending colon measuring up to 8.7 x 6.9 x 8.8 cm, with invasion of the pericolonic fat and direct tumor invasion of portions of the left iliopsoas muscle at the level of the superior iliac crest and direct invasion of the left lateral flank muscle wall. Findings are compatible with primary colonic malignancy. No bowel obstruction. 2. No lymphadenopathy or other definite findings of metastatic disease. 3. Two scattered subcentimeter hypodense liver lesions, too small to accurately characterize, indeterminate. Indeterminate left adrenal 1.3 cm nodule. Further evaluation with MRI abdomen without and with IV contrast may be obtained at this time, as clinically warranted. 4. Two scattered tiny solid left pulmonary nodules, largest 0.3 cm, indeterminate. Recommend attention on follow-up chest CT in 3 months. 5. Large hiatal hernia. Mildly patulous lower thoracic esophagus with small amount of layering oral contrast, suggesting esophageal dysmotility and/or gastroesophageal reflux. 6. Chronic severe T12 vertebral compression fracture. 7. Aberrant nonaneurysmal right subclavian artery. 8.   Aortic Atherosclerosis (ICD10-I70.0).   07/04/2022 Tumor Marker   CEA 9.3   07/10/2022 Imaging   MRI abdomen w wo contrast 1. 11 mm LEFT adrenal lesion is most consistent with an adenoma.Based on current clinical literature, biochemical evaluation to exclude possible functioning adrenal nodule is suggested if not already performed.  2. Locally advanced colonic neoplasm involving musculature in the LEFT lower quadrant including LEFT iliac and oblique insertion sites along the LEFT iliac fossa better demonstrated on recent CT imaging of the chest, abdomen and pelvis. 3. Hepatic cysts.4. Large hiatal hernia, nearly the entire stomach is herniated into the chest.   07/29/2022 Surgery   Patient went left partial colectomy and pelvic wall resection.  Pathology showed A: Colon and abdominal wall, left partial colectomy with resection Invasive colorectal adenocarcinoma, perforated, with associated pericolonic abscess and involving psoas muscle.  Inflammatory response involving left fallopian tube and ovary.  Simple ovarian cyst, tattoo ink, left ptosis and hyperplastic polyps  B.  Pelvic wall resection, Focal invasive colorectal adenocarcinoma involving fibroadipose tissue with abscess and inflamed granulation tissue.  Histology grade: G2, moderately differentiated, no LVI, no perineural invasion, tumor budding score low [0-4] All margins negative for invasive carcinoma, high-grade dysplasia/intramucosal carcinoma and low-grade dysplastic. 53 regional lymph nodes were all negative for tumor involvement.  No tumor deposits.  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 The remainder of testing was normal. The report date is 08/24/2022.  The Multi-Cancer + RNA Panel offered by [facility] includes sequencing and/or deletion/duplication analysis of the following 70 genes:  AIP*, ALK, APC*, ATM*, AXIN2*,  BAP1*, BARD1*, BLM*, BMPR1A*, BRCA1*, BRCA2*, BRIP1*, CDC73*, CDH1*, CDK4, CDKN1B*, CDKN2A, CHEK2*, CTNNA1*, DICER1*, EPCAM, EGFR, FH*, FLCN*, GREM1, HOXB13, KIT, LZTR1, MAX*, MBD4, MEN1*, MET, MITF, MLH1*, MSH2*, MSH3*, MSH6*, MUTYH*, NF1*, NF2*, NTHL1*, PALB2*, PDGFRA, PMS2*, POLD1*, POLE*, POT1*, PRKAR1A*, PTCH1*, PTEN*, RAD51C*, RAD51D*, RB1*, RET, SDHA*, SDHAF2*, SDHB*, SDHC*, SDHD*, SMAD4*, SMARCA4*, SMARCB1*, SMARCE1*, STK11*, SUFU*, TMEM127*, TP53*, TSC1*, TSC2*, VHL*. RNA analysis is performed for * genes.   07/29/2022 Cancer Staging   Staging form: Colon and Rectum, AJCC 8th Edition - Pathologic stage from 07/29/2022: Stage IIC (pT4b, pN0, cM0) - Signed by Earlie Server, MD on 10/03/2022 Stage prefix: Initial diagnosis Total positive nodes: 0   09/25/2022 Miscellaneous   Signatera positive, MTM readout 0.06    Patient denies unintentional weight loss, blood in his stool, abdominal pain, nausea vomiting. She denies any constipation or difficulty passing bowel movements.  Her stool is dark, she attributes to taking oral iron supplementation.  Family history positive for brother with lung cancer.    INTERVAL HISTORY KAYSEE TREVITHICK is a 87 y.o. female who has above history reviewed by me today presents for follow up visit for colon cancer.  S/p surgical resection.  She presents to discuss pathology results and adjuvant plan.  Patient is currently on adjuvant radiation.  Overall she tolerates well. She has no new complaints today   MEDICAL HISTORY:  Past Medical History:  Diagnosis Date   Allergy    Anemia    H/O AS A CHILD   Arthritis    Back pain    Breast cancer of upper-outer quadrant of right female breast ([place]) 06/12/2015   4 millimeter, T1a, N0; ER 90%, PR 0%, HER-2/neu not overexpressed.  Declined radiation therapy.   Collar bone fracture 05/28/2013   Fractured Collar bone on left   Dyspnea    Dysrhythmia    h/o heart skipping beat years ago   Frequent headaches    h/o  migraines   Hypertension    Osteopenia 04/2018   Bone density without significant interval change since 2017.   Pneumonia     SURGICAL HISTORY: Past Surgical History:  Procedure Laterality Date   ABDOMINAL HYSTERECTOMY  1970   Partial   BREAST BIOPSY Right 05/28/2015   INVASIVE MAMMARY CARCINOMA    BREAST BIOPSY Right 06/12/2015   Wide excision, sentinel lymph node biopsy.   BREAST LUMPECTOMY WITH AXILLARY LYMPH NODE DISSECTION Right 06/12/2015   4 mm, T1a,N0 withDCIS with clear margins. ER: 90%, PR 0%; Her 2 neu not overexpressed.    CATARACT EXTRACTION W/PHACO Left 02/03/2017   Procedure: CATARACT EXTRACTION PHACO AND INTRAOCULAR LENS PLACEMENT (IOC);  Surgeon: Birder Robson, MD;  Location: [facility];  Service: Ophthalmology;  Laterality: Left;  [place]  00:28 AP% 16.3 CDE 4.68 Fluid pack lot # [id number]   CATARACT EXTRACTION W/PHACO Right 03/01/2019   Procedure: CATARACT EXTRACTION PHACO AND INTRAOCULAR LENS PLACEMENT ([place])  RIGHT;  Surgeon: Birder Robson, MD;  Location: [place];  Service: Ophthalmology;  Laterality: Right;   COLONOSCOPY     COLONOSCOPY WITH PROPOFOL N/A 06/27/2022   Procedure: COLONOSCOPY WITH PROPOFOL;  Surgeon: [name], MD;  Location: [place];  Service: Endoscopy;  Laterality: N/A;   ESOPHAGOGASTRODUODENOSCOPY (EGD) WITH PROPOFOL N/A 06/27/2022   Procedure: ESOPHAGOGASTRODUODENOSCOPY (EGD) WITH PROPOFOL;  Surgeon: [name], MD;  Location: [place];  Service: Endoscopy;  Laterality: N/A;   PARTIAL COLECTOMY N/A 07/29/2022   Procedure: PARTIAL COLECTOMY, open left, RNFA to assist;  Surgeon: Jules Husbands, MD;  Location: [facility];  Service: General;  Laterality: N/A;    SOCIAL HISTORY: Social History   Socioeconomic History   Marital status: Married    Spouse name: ,Meda Coffee   Number of children: 3   Years of education: GED   Highest education level: 12th grade  Occupational History   Occupation: Retired  Tobacco  Use   Smoking status: Never    Passive exposure: Never   Smokeless tobacco: Never   Tobacco comments:    smoking cessation materials not required  Vaping Use   Vaping Use: Never used  Substance and Sexual Activity   Alcohol use: No   Drug use: No   Sexual activity: Not Currently  Other Topics Concern   Not on file  Social History Narrative   Not on file   Social Determinants of Health   Financial Resource Strain: Low Risk  (09/25/2021)   Overall Financial Resource Strain (CARDIA)    Difficulty of Paying Living Expenses: Not hard at all  Food Insecurity: No Food Insecurity (08/26/2022)   Hunger Vital Sign    Worried About Running Out of Food in the Last Year: Never true    [place] in the Last Year: Never true  Transportation Needs: No Transportation Needs (08/04/2022)   PRAPARE - Hydrologist (Medical): No    Lack of Transportation (Non-Medical): No  Physical Activity: Inactive (09/25/2021)   Exercise Vital Sign    Days of Exercise per Week: 0 days    Minutes of Exercise per Session: 0 min  Stress: No Stress Concern Present (09/25/2021)   [place]    Feeling of Stress : Not at all  Social Connections: Moderately Integrated (09/25/2021)   Social Connection and Isolation Panel [NHANES]    Frequency of Communication with Friends and Family: More than three times a week    Frequency of Social Gatherings with Friends and Family: More than three times a week    Attends Religious Services: More than 4 times per year    Active Member of [employer] or Organizations: No    Attends Archivist Meetings: Never    Marital Status: Married  Human resources officer Violence: Not At Risk (08/04/2022)   Humiliation, Afraid, Rape, and Kick questionnaire    Fear of Current or Ex-Partner: No    Emotionally Abused: No    Physically Abused: No    Sexually Abused: No    FAMILY HISTORY: Family History   Problem Relation Age of Onset   Mental illness Mother        Dementia   Stroke Father    Lung cancer Brother    Breast cancer Other        dx under 33   Brain cancer Granddaughter 12       glioblastoma    ALLERGIES:  is allergic to [place] [brimonidine tartrate-timolol], lisinopril, and other.  MEDICATIONS:  Current Outpatient Medications  Medication Sig Dispense Refill   acetaminophen (TYLENOL) 500 MG tablet Take 500 mg by mouth every 6 (six) hours as needed for pain.     latanoprost (XALATAN)  0.005 % ophthalmic solution 1 drop at bedtime. Eye doctor     losartan-hydrochlorothiazide (HYZAAR) 100-12.5 MG tablet Take 1 tablet by mouth daily. 90 tablet 1   methocarbamol (ROBAXIN) 500 MG tablet Take 1 tablet (500 mg total) by mouth every 8 (eight) hours as needed for muscle spasms. 15 tablet 0   pantoprazole (PROTONIX) 40 MG tablet Take 1 tablet (40 mg total) by mouth 2 (two) times daily before a meal. 60 tablet 2   promethazine-dextromethorphan (PROMETHAZINE-DM) 6.25-15 MG/5ML syrup Take 2.5 mLs by mouth 4 (four) times daily as needed. 118 mL 0   No current facility-administered medications for this visit.    Review of Systems  Constitutional:  Positive for fatigue. Negative for appetite change, chills and fever.  HENT:   Negative for hearing loss and voice change.   Eyes:  Negative for eye problems.  Respiratory:  Negative for chest tightness and cough.   Cardiovascular:  Negative for chest pain.  Gastrointestinal:  Negative for abdominal distention, abdominal pain and blood in stool.       Dark stool  Endocrine: Negative for hot flashes.  Genitourinary:  Negative for difficulty urinating and frequency.   Musculoskeletal:  Negative for arthralgias.  Skin:  Negative for itching and rash.  Neurological:  Negative for extremity weakness.  Hematological:  Negative for adenopathy.  Psychiatric/Behavioral:  Negative for confusion.      PHYSICAL EXAMINATION: ECOG PERFORMANCE  STATUS: 1 - Symptomatic but completely ambulatory  Vitals:   10/03/22 0918  BP: (!) 151/69  Pulse: 86  Resp: 18  Temp: (!) 96.3 F (35.7 C)  SpO2: 99%   Filed Weights   10/03/22 0918  Weight: 127 lb 14.4 oz (58 kg)    Physical Exam Constitutional:      General: She is not in acute distress.    Appearance: She is not diaphoretic.  HENT:     Head: Normocephalic and atraumatic.  Eyes:     General: No scleral icterus. Cardiovascular:     Rate and Rhythm: Normal rate.  Pulmonary:     Effort: Pulmonary effort is normal. No respiratory distress.  Abdominal:     General: There is no distension.  Musculoskeletal:        General: Normal range of motion.     Cervical back: Normal range of motion.  Skin:    Findings: No erythema or rash.  Neurological:     Mental Status: She is alert and oriented to person, place, and time. Mental status is at baseline.     Cranial Nerves: No cranial nerve deficit.     Motor: No abnormal muscle tone.     Coordination: Coordination normal.  Psychiatric:        Mood and Affect: Mood and affect normal.     LABORATORY DATA:  I have reviewed the data as listed    Latest Ref Rng & Units 10/02/2022    9:51 AM 09/25/2022    9:28 AM 09/18/2022    9:36 AM  CBC  WBC 4.0 - 10.5 K/uL 8.7  6.3  8.9   Hemoglobin 12.0 - 15.0 g/dL 13.0  12.2  12.1   Hematocrit 36.0 - 46.0 % 39.8  38.7  39.7   Platelets 150 - 400 K/uL 287  271  288       Latest Ref Rng & Units 08/04/2022    8:11 AM 08/03/2022    5:40 AM 08/02/2022   11:39 PM  CMP  Glucose 70 - 99 mg/dL 84  124  116   BUN 8 - 23 mg/dL 7  11  15    Creatinine 0.44 - 1.00 mg/dL 0.58  0.58  0.58   Sodium 135 - 145 mmol/L 135  134  134   Potassium 3.5 - 5.1 mmol/L 3.5  3.6  3.6   Chloride 98 - 111 mmol/L 105  104  104   CO2 22 - 32 mmol/L 23  20  22    Calcium 8.9 - 10.3 mg/dL 7.6  7.8  8.1   Total Protein 6.5 - 8.1 g/dL   5.6   Total Bilirubin 0.3 - 1.2 mg/dL   0.7   Alkaline Phos 38 - 126 U/L   77    AST 15 - 41 U/L   27   ALT 0 - 44 U/L   18      RADIOGRAPHIC STUDIES: I have personally reviewed the radiological images as listed and agreed with the findings in the report. No results found.

## 2022-10-03 NOTE — Assessment & Plan Note (Signed)
4 millimeter, T1a, N0; ER 90%, PR 0%, HER-2/neu negative. Declined radiation therapy.  She finished 6 years of Letrozole, previously managed by Dr. Bary Castilla.

## 2022-10-03 NOTE — Assessment & Plan Note (Addendum)
Pathology results are reviewed and discussed with patient. 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  Benefit of adjuvant 5-FU monotherapy in stage II dMMR patients  is uncertain. With her age, I most likely will not offer chemotherapy.  Signatera ctDNA  positive. Small lung nodules, too small to characterized- attention on follow up CT T4b disease, continue follow-up with radiation oncology to finish radiation.  I had a lengthy discussion with patient and her family member.  We discussed about option of adjuvant chemotherapy due to positive/detectable Signatera ct DNA.  Rationale and potential side effects of chemotherapy were reviewed and discussed with patient.Patient declined chemotherapy.  She prefers to be observed with follow-up lab and CT scan.  I plan to repeat CT scan 3-4 months after his surgery.  We discussed about utility of Signatera repeat testing to follow-up on ct DNA levels.  Patient expressed her desires of not having progressive chemotherapy in the future even after disease recurrence/development of stage IV disease.  Shared decision was made to not to repeat further Signatera testing.

## 2022-10-03 NOTE — Assessment & Plan Note (Signed)
Finish IV Venofer treatments.  Will repeat levels at the next visit.

## 2022-10-06 ENCOUNTER — Other Ambulatory Visit: Payer: Self-pay

## 2022-10-06 ENCOUNTER — Ambulatory Visit
Admission: RE | Admit: 2022-10-06 | Discharge: 2022-10-06 | Disposition: A | Discharge status: Home / Self Care | Payer: Medicare HMO | Source: Ambulatory Visit | Attending: Radiation Oncology | Admitting: Radiation Oncology

## 2022-10-06 DIAGNOSIS — Z510 Encounter for antineoplastic radiation therapy: Secondary | ICD-10-CM | POA: Diagnosis not present

## 2022-10-06 LAB — RAD ONC ARIA SESSION SUMMARY
Course Elapsed Days: 28
Plan Fractions Treated to Date: 21
Plan Prescribed Dose Per Fraction: 1.8 Gy
Plan Total Fractions Prescribed: 25
Plan Total Prescribed Dose: 45 Gy
Reference Point Dosage Given to Date: 37.8 Gy
Reference Point Session Dosage Given: 1.8 Gy
Session Number: 21

## 2022-10-07 ENCOUNTER — Other Ambulatory Visit: Payer: Self-pay

## 2022-10-07 ENCOUNTER — Ambulatory Visit
Admission: RE | Admit: 2022-10-07 | Discharge: 2022-10-07 | Disposition: A | Discharge status: Home / Self Care | Payer: Medicare HMO | Source: Ambulatory Visit | Attending: Radiation Oncology | Admitting: Radiation Oncology

## 2022-10-07 DIAGNOSIS — Z510 Encounter for antineoplastic radiation therapy: Secondary | ICD-10-CM | POA: Diagnosis not present

## 2022-10-07 LAB — RAD ONC ARIA SESSION SUMMARY
Course Elapsed Days: 29
Plan Fractions Treated to Date: 22
Plan Prescribed Dose Per Fraction: 1.8 Gy
Plan Total Fractions Prescribed: 25
Plan Total Prescribed Dose: 45 Gy
Reference Point Dosage Given to Date: 39.6 Gy
Reference Point Session Dosage Given: 1.8 Gy
Session Number: 22

## 2022-10-08 ENCOUNTER — Other Ambulatory Visit: Payer: Self-pay

## 2022-10-08 ENCOUNTER — Ambulatory Visit
Admission: RE | Admit: 2022-10-08 | Discharge: 2022-10-08 | Disposition: A | Discharge status: Home / Self Care | Payer: Medicare HMO | Source: Ambulatory Visit | Attending: Radiation Oncology | Admitting: Radiation Oncology

## 2022-10-08 DIAGNOSIS — Z510 Encounter for antineoplastic radiation therapy: Secondary | ICD-10-CM | POA: Diagnosis not present

## 2022-10-08 LAB — RAD ONC ARIA SESSION SUMMARY
Course Elapsed Days: 30
Plan Fractions Treated to Date: 23
Plan Prescribed Dose Per Fraction: 1.8 Gy
Plan Total Fractions Prescribed: 25
Plan Total Prescribed Dose: 45 Gy
Reference Point Dosage Given to Date: 41.4 Gy
Reference Point Session Dosage Given: 1.8 Gy
Session Number: 23

## 2022-10-09 ENCOUNTER — Ambulatory Visit
Admission: RE | Admit: 2022-10-09 | Discharge: 2022-10-09 | Disposition: A | Discharge status: Home / Self Care | Payer: Medicare HMO | Source: Ambulatory Visit | Attending: Radiation Oncology | Admitting: Radiation Oncology

## 2022-10-09 ENCOUNTER — Inpatient Hospital Stay: Payer: Medicare HMO

## 2022-10-09 ENCOUNTER — Other Ambulatory Visit: Payer: Self-pay

## 2022-10-09 DIAGNOSIS — Z510 Encounter for antineoplastic radiation therapy: Secondary | ICD-10-CM | POA: Diagnosis not present

## 2022-10-09 DIAGNOSIS — C186 Malignant neoplasm of descending colon: Secondary | ICD-10-CM

## 2022-10-09 LAB — CBC
HCT: 38.9 % (ref 36.0–46.0)
Hemoglobin: 12.7 g/dL (ref 12.0–15.0)
MCH: 28.1 pg (ref 26.0–34.0)
MCHC: 32.6 g/dL (ref 30.0–36.0)
MCV: 86.1 fL (ref 80.0–100.0)
Platelets: 336 10*3/uL (ref 150–400)
RBC: 4.52 MIL/uL (ref 3.87–5.11)
RDW: 17.5 % — ABNORMAL HIGH (ref 11.5–15.5)
WBC: 7.4 10*3/uL (ref 4.0–10.5)
nRBC: 0 % (ref 0.0–0.2)

## 2022-10-09 LAB — RAD ONC ARIA SESSION SUMMARY
Course Elapsed Days: 31
Plan Fractions Treated to Date: 24
Plan Prescribed Dose Per Fraction: 1.8 Gy
Plan Total Fractions Prescribed: 25
Plan Total Prescribed Dose: 45 Gy
Reference Point Dosage Given to Date: 43.2 Gy
Reference Point Session Dosage Given: 1.8 Gy
Session Number: 24

## 2022-10-10 ENCOUNTER — Ambulatory Visit
Admission: RE | Admit: 2022-10-10 | Discharge: 2022-10-10 | Disposition: A | Discharge status: Home / Self Care | Payer: Medicare HMO | Source: Ambulatory Visit | Attending: Radiation Oncology | Admitting: Radiation Oncology

## 2022-10-10 ENCOUNTER — Other Ambulatory Visit: Payer: Self-pay

## 2022-10-10 DIAGNOSIS — Z510 Encounter for antineoplastic radiation therapy: Secondary | ICD-10-CM | POA: Diagnosis not present

## 2022-10-10 LAB — RAD ONC ARIA SESSION SUMMARY
Course Elapsed Days: 32
Plan Fractions Treated to Date: 25
Plan Prescribed Dose Per Fraction: 1.8 Gy
Plan Total Fractions Prescribed: 25
Plan Total Prescribed Dose: 45 Gy
Reference Point Dosage Given to Date: 45 Gy
Reference Point Session Dosage Given: 1.8 Gy
Session Number: 25

## 2022-11-11 ENCOUNTER — Ambulatory Visit: Payer: Medicare HMO | Admitting: Oncology

## 2022-11-11 ENCOUNTER — Other Ambulatory Visit: Payer: Medicare HMO

## 2022-11-17 ENCOUNTER — Encounter: Payer: Self-pay | Admitting: Radiation Oncology

## 2022-11-17 ENCOUNTER — Ambulatory Visit
Admission: RE | Admit: 2022-11-17 | Discharge: 2022-11-17 | Disposition: A | Discharge status: Home / Self Care | Payer: Medicare HMO | Source: Ambulatory Visit | Attending: Radiation Oncology | Admitting: Radiation Oncology

## 2022-11-17 VITALS — BP 164/76 | HR 93 | Temp 98.3°F | Resp 16 | Wt 128.0 lb

## 2022-11-17 DIAGNOSIS — C186 Malignant neoplasm of descending colon: Secondary | ICD-10-CM | POA: Diagnosis present

## 2022-11-17 DIAGNOSIS — Z923 Personal history of irradiation: Secondary | ICD-10-CM | POA: Diagnosis not present

## 2022-11-17 NOTE — Progress Notes (Signed)
Radiation Oncology Follow up Note  Name: Kristin Miller   Date:   11/17/2022 MRN:  [id number] DOB: 09-11-1935    This 87 y.o. female presents to the clinic today for 1 month follow-up status post radiation therapy for stage IIb (pT4b N0 M0) adenocarcinoma of the descending colon status post resection with involvement of an invasion of the psoas muscle and pelvic sidewall.  REFERRING PROVIDER: Araceli Bouche, DO  HPI: Patient is an 87 year old female now out 1 month having completed adjuvant radiation therapy for a T4b adenocarcinoma of the descending colon.  C seen today in routine follow-up she is doing well specifically denies any change in her bowel pattern any abdominal pain except for some mild adhesions from her prior surgery.  She is also having no problems with lower urinary tract symptoms..  COMPLICATIONS OF TREATMENT: none  FOLLOW UP COMPLIANCE: keeps appointments   PHYSICAL EXAM:  BP (!) 164/76 (BP Location: Left Arm, Patient Position: Sitting, Cuff Size: Normal)   Pulse 93   Temp 98.3 F (36.8 C) (Tympanic)   Resp 16   Wt 128 lb (58.1 kg)   BMI 25.00 kg/m  Patient is a functioning colostomy.  Well-developed well-nourished patient in NAD. HEENT reveals PERLA, EOMI, discs not visualized.  Oral cavity is clear. No oral mucosal lesions are identified. Neck is clear without evidence of cervical or supraclavicular adenopathy. Lungs are clear to A&P. Cardiac examination is essentially unremarkable with regular rate and rhythm without murmur rub or thrill. Abdomen is benign with no organomegaly or masses noted. Motor sensory and DTR levels are equal and symmetric in the upper and lower extremities. Cranial nerves II through XII are grossly intact. Proprioception is intact. No peripheral adenopathy or edema is identified. No motor or sensory levels are noted. Crude visual fields are within normal range.  RADIOLOGY RESULTS: No current films for review  PLAN: Present time patient is  doing well 1 month out from adjuvant radiation therapy for locally advanced colon cancer.  And pleased with her overall progress.  I have asked to see her back in 4 to 5 months for follow-up.  She is planning on having her colostomy reversed if that is possible.  Patient is to call with any concerns.  I would like to take this opportunity to thank you for allowing me to participate in the care of your patient.[name], MD

## 2022-12-01 ENCOUNTER — Encounter: Payer: Self-pay | Admitting: Surgery

## 2022-12-01 ENCOUNTER — Ambulatory Visit: Payer: Medicare HMO | Admitting: Surgery

## 2022-12-01 VITALS — BP 143/79 | HR 77 | Temp 98.0°F | Ht 60.0 in | Wt 129.6 lb

## 2022-12-01 DIAGNOSIS — C186 Malignant neoplasm of descending colon: Secondary | ICD-10-CM

## 2022-12-01 NOTE — Patient Instructions (Signed)
We will schedule you for a follow up in 3 months. We will send you a letter about this appointment.     Please call and ask to speak with a nurse if you develop questions or concerns.

## 2022-12-02 NOTE — Progress Notes (Signed)
Outpatient Surgical Follow Up  12/02/2022  Kristin Miller is an 87 y.o. female.   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  She completed 5 weeks of adjuvant XRT, She is doing well considering her disease.  She is tolerating diet she is ambulating and her colostomy is working well.  No fevers no chills.  sHe looks the best she has ever been for the last couple months. She was wondering today about colostomy reversal. She declined adjuvant chemo.  Past Medical History:  Diagnosis Date   Allergy    Anemia    H/O AS A CHILD   Arthritis    Back pain    Breast cancer of upper-outer quadrant of right female breast (HCC) 06/12/2015   4 millimeter, T1a, N0; ER 90%, PR 0%, HER-2/neu not overexpressed.  Declined radiation therapy.   Collar bone fracture 05/28/2013   Fractured Collar bone on left   Dyspnea    Dysrhythmia    h/o heart skipping beat years ago   Frequent headaches    h/o migraines   Hypertension    Osteopenia 04/2018   Bone density without significant interval change since 2017.   Pneumonia     Past Surgical History:  Procedure Laterality Date   ABDOMINAL HYSTERECTOMY  1970   Partial   BREAST BIOPSY Right 05/28/2015   INVASIVE MAMMARY CARCINOMA    BREAST BIOPSY Right 06/12/2015   Wide excision, sentinel lymph node biopsy.   BREAST LUMPECTOMY WITH AXILLARY LYMPH NODE DISSECTION Right 06/12/2015   4 mm, T1a,N0 withDCIS with clear margins. ER: 90%, PR 0%; Her 2 neu not overexpressed.    CATARACT EXTRACTION W/PHACO Left 02/03/2017   Procedure: CATARACT EXTRACTION PHACO AND INTRAOCULAR LENS PLACEMENT (IOC);  Surgeon: Galen Manila, MD;  Location: [facility];  Service: Ophthalmology;  Laterality: Left;  [place]  00:28 AP% 16.3 CDE 4.68 Fluid pack lot # [id number]   CATARACT EXTRACTION W/PHACO Right  03/01/2019   Procedure: CATARACT EXTRACTION PHACO AND INTRAOCULAR LENS PLACEMENT (IOC)  RIGHT;  Surgeon: Galen Manila, MD;  Location: [facility];  Service: Ophthalmology;  Laterality: Right;   COLONOSCOPY     COLONOSCOPY WITH PROPOFOL N/A 06/27/2022   Procedure: COLONOSCOPY WITH PROPOFOL;  Surgeon: Midge Minium, MD;  Location: [facility];  Service: Endoscopy;  Laterality: N/A;   ESOPHAGOGASTRODUODENOSCOPY (EGD) WITH PROPOFOL N/A 06/27/2022   Procedure: ESOPHAGOGASTRODUODENOSCOPY (EGD) WITH PROPOFOL;  Surgeon: Midge Minium, MD;  Location: [facility];  Service: Endoscopy;  Laterality: N/A;   PARTIAL COLECTOMY N/A 07/29/2022   Procedure: PARTIAL COLECTOMY, open left, RNFA to assist;  Surgeon: Leafy Ro, MD;  Location: [facility];  Service: General;  Laterality: N/A;    Family History  Problem Relation Age of Onset   Mental illness Mother        Dementia   Stroke Father    Lung cancer Brother    Breast cancer Other        dx under 66   Brain cancer Granddaughter 12       glioblastoma    Social History:  reports that she has never smoked. She has never been exposed to tobacco smoke. She has never used smokeless tobacco. She reports that she does not drink alcohol and does not use drugs.  Allergies:  Allergies  Allergen Reactions  Combigan [Brimonidine Tartrate-Timolol]     Redness, burning, irritation    Lisinopril Cough   Other Rash    SUTURES-(PARTIAL HYSTERECTOMY)    Medications reviewed.    ROS Full ROS performed and is otherwise negative other than what is stated in HPI   BP (!) 143/79   Pulse 77   Temp 98 F (36.7 C)   Ht 5' (1.524 m)   Wt 129 lb 9.6 oz (58.8 kg)   SpO2 97%   BMI 25.31 kg/m   Physical Exam Vitals and nursing note reviewed. Exam conducted with a chaperone present.  Constitutional:      General: She is not in acute distress.    Appearance: Normal appearance. She is normal weight.  Cardiovascular:     Rate and  Rhythm: Normal rate and regular rhythm.     Heart sounds: No murmur heard.    No friction rub.  Pulmonary:     Effort: Pulmonary effort is normal. No respiratory distress.     Breath sounds: Normal breath sounds. No stridor. No wheezing or rhonchi.  Abdominal:     General: Abdomen is flat. There is no distension.     Palpations: Abdomen is soft. There is no mass.     Tenderness: There is no abdominal tenderness.     Hernia: No hernia is present.     Comments: Ostomy in place working on widely patent.  No infection.  Midline scar healed  Musculoskeletal:        General: Normal range of motion.     Cervical back: Normal range of motion and neck supple. No rigidity or tenderness.  Skin:    General: Skin is warm and dry.     Capillary Refill: Capillary refill takes less than 2 seconds.  Neurological:     General: No focal deficit present.     Mental Status: She is alert and oriented to person, place, and time.  Psychiatric:        Mood and Affect: Mood normal.        Behavior: Behavior normal.        Thought Content: Thought content normal.        Judgment: Judgment normal.       Assessment/Plan: Doing very well after sigmoid colectomy and radical pelvic resection for adenocarcinoma of the colon.  Completed adjuvant radiation therapy.  And she is doing very well from a surgical perspective.  Had an extensive discussion with her about her disease process.  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  I do think that she is high risk for developing complication specifically anastomotic leak.  At this point in time she wishes to hold off of any reversal.  I do not recommend colostomy takedown as she is at high risk for developing complications.  I will be happy to see her in a few months  I spent 30 minutes in this encounter including reviewing medical records, counseling the patient, placing orders and performing  appropriate documentation  Sterling Big, MD [facility] General Surgeon

## 2022-12-11 ENCOUNTER — Inpatient Hospital Stay: Payer: Medicare HMO | Attending: Oncology

## 2022-12-11 DIAGNOSIS — C186 Malignant neoplasm of descending colon: Secondary | ICD-10-CM | POA: Insufficient documentation

## 2022-12-11 DIAGNOSIS — D509 Iron deficiency anemia, unspecified: Secondary | ICD-10-CM | POA: Insufficient documentation

## 2022-12-11 DIAGNOSIS — C189 Malignant neoplasm of colon, unspecified: Secondary | ICD-10-CM

## 2022-12-11 LAB — CBC WITH DIFFERENTIAL (CANCER CENTER ONLY)
Abs Immature Granulocytes: 0.05 10*3/uL (ref 0.00–0.07)
Basophils Absolute: 0.1 10*3/uL (ref 0.0–0.1)
Basophils Relative: 1 %
Eosinophils Absolute: 0.6 10*3/uL — ABNORMAL HIGH (ref 0.0–0.5)
Eosinophils Relative: 6 %
HCT: 38.2 % (ref 36.0–46.0)
Hemoglobin: 12.3 g/dL (ref 12.0–15.0)
Immature Granulocytes: 1 %
Lymphocytes Relative: 28 %
Lymphs Abs: 2.8 10*3/uL (ref 0.7–4.0)
MCH: 28.9 pg (ref 26.0–34.0)
MCHC: 32.2 g/dL (ref 30.0–36.0)
MCV: 89.9 fL (ref 80.0–100.0)
Monocytes Absolute: 0.9 10*3/uL (ref 0.1–1.0)
Monocytes Relative: 9 %
Neutro Abs: 5.4 10*3/uL (ref 1.7–7.7)
Neutrophils Relative %: 55 %
Platelet Count: 299 10*3/uL (ref 150–400)
RBC: 4.25 MIL/uL (ref 3.87–5.11)
RDW: 14.5 % (ref 11.5–15.5)
WBC Count: 9.9 10*3/uL (ref 4.0–10.5)
nRBC: 0 % (ref 0.0–0.2)

## 2022-12-11 LAB — CMP (CANCER CENTER ONLY)
ALT: 15 U/L (ref 0–44)
AST: 24 U/L (ref 15–41)
Albumin: 3.8 g/dL (ref 3.5–5.0)
Alkaline Phosphatase: 69 U/L (ref 38–126)
Anion gap: 8 (ref 5–15)
BUN: 18 mg/dL (ref 8–23)
CO2: 27 mmol/L (ref 22–32)
Calcium: 8.8 mg/dL — ABNORMAL LOW (ref 8.9–10.3)
Chloride: 104 mmol/L (ref 98–111)
Creatinine: 0.73 mg/dL (ref 0.44–1.00)
GFR, Estimated: >60 mL/min (ref >60)
Glucose, Bld: 132 mg/dL — ABNORMAL HIGH (ref 70–99)
Potassium: 3.7 mmol/L (ref 3.5–5.1)
Sodium: 139 mmol/L (ref 135–145)
Total Bilirubin: 0.4 mg/dL (ref 0.3–1.2)
Total Protein: 6.7 g/dL (ref 6.5–8.1)

## 2022-12-12 LAB — CEA: CEA: 2 ng/mL (ref 0.0–4.7)

## 2022-12-20 LAB — SIGNATERA
SIGNATERA MTM READOUT: 0 MTM/ml
SIGNATERA TEST RESULT: NEGATIVE

## 2023-01-09 ENCOUNTER — Inpatient Hospital Stay: Payer: Medicare HMO | Attending: Oncology

## 2023-01-09 DIAGNOSIS — D509 Iron deficiency anemia, unspecified: Secondary | ICD-10-CM | POA: Diagnosis not present

## 2023-01-09 DIAGNOSIS — Z853 Personal history of malignant neoplasm of breast: Secondary | ICD-10-CM | POA: Insufficient documentation

## 2023-01-09 DIAGNOSIS — C186 Malignant neoplasm of descending colon: Secondary | ICD-10-CM | POA: Insufficient documentation

## 2023-01-09 DIAGNOSIS — D500 Iron deficiency anemia secondary to blood loss (chronic): Secondary | ICD-10-CM

## 2023-01-09 DIAGNOSIS — Z1509 Genetic susceptibility to other malignant neoplasm: Secondary | ICD-10-CM | POA: Insufficient documentation

## 2023-01-09 LAB — CBC WITH DIFFERENTIAL/PLATELET
Abs Immature Granulocytes: 0.04 10*3/uL (ref 0.00–0.07)
Basophils Absolute: 0.1 10*3/uL (ref 0.0–0.1)
Basophils Relative: 1 %
Eosinophils Absolute: 0.2 10*3/uL (ref 0.0–0.5)
Eosinophils Relative: 2 %
HCT: 37 % (ref 36.0–46.0)
Hemoglobin: 12.2 g/dL (ref 12.0–15.0)
Immature Granulocytes: 1 %
Lymphocytes Relative: 26 %
Lymphs Abs: 2.3 10*3/uL (ref 0.7–4.0)
MCH: 30 pg (ref 26.0–34.0)
MCHC: 33 g/dL (ref 30.0–36.0)
MCV: 90.9 fL (ref 80.0–100.0)
Monocytes Absolute: 0.9 10*3/uL (ref 0.1–1.0)
Monocytes Relative: 10 %
Neutro Abs: 5.3 10*3/uL (ref 1.7–7.7)
Neutrophils Relative %: 60 %
Platelets: 330 10*3/uL (ref 150–400)
RBC: 4.07 MIL/uL (ref 3.87–5.11)
RDW: 13.3 % (ref 11.5–15.5)
WBC: 8.8 10*3/uL (ref 4.0–10.5)
nRBC: 0 % (ref 0.0–0.2)

## 2023-01-09 LAB — COMPREHENSIVE METABOLIC PANEL
ALT: 18 U/L (ref 0–44)
AST: 23 U/L (ref 15–41)
Albumin: 3.8 g/dL (ref 3.5–5.0)
Alkaline Phosphatase: 61 U/L (ref 38–126)
Anion gap: 9 (ref 5–15)
BUN: 20 mg/dL (ref 8–23)
CO2: 27 mmol/L (ref 22–32)
Calcium: 9.1 mg/dL (ref 8.9–10.3)
Chloride: 103 mmol/L (ref 98–111)
Creatinine, Ser: 0.68 mg/dL (ref 0.44–1.00)
GFR, Estimated: >60 mL/min (ref >60)
Glucose, Bld: 109 mg/dL — ABNORMAL HIGH (ref 70–99)
Potassium: 3.8 mmol/L (ref 3.5–5.1)
Sodium: 139 mmol/L (ref 135–145)
Total Bilirubin: 0.7 mg/dL (ref 0.3–1.2)
Total Protein: 6.7 g/dL (ref 6.5–8.1)

## 2023-01-09 LAB — FERRITIN: Ferritin: 84 ng/mL (ref 11–307)

## 2023-01-09 LAB — IRON AND TIBC
Iron: 70 ug/dL (ref 28–170)
Saturation Ratios: 22 % (ref 10.4–31.8)
TIBC: 315 ug/dL (ref 250–450)
UIBC: 245 ug/dL

## 2023-01-11 LAB — CEA: CEA: 2.4 ng/mL (ref 0.0–4.7)

## 2023-01-12 ENCOUNTER — Ambulatory Visit
Admission: RE | Admit: 2023-01-12 | Discharge: 2023-01-12 | Disposition: A | Discharge status: Home / Self Care | Payer: Medicare HMO | Source: Ambulatory Visit | Attending: Oncology | Admitting: Oncology

## 2023-01-12 DIAGNOSIS — C189 Malignant neoplasm of colon, unspecified: Secondary | ICD-10-CM | POA: Insufficient documentation

## 2023-01-12 MED ORDER — BARIUM SULFATE 2 % PO SUSP
450.0000 mL | ORAL | Status: AC
  Administered 2023-01-12 (×2): 450 mL via ORAL

## 2023-01-12 MED ORDER — IOHEXOL 300 MG/ML  SOLN
80.0000 mL | Freq: Once | INTRAMUSCULAR | Status: AC | PRN
  Administered 2023-01-12: 80 mL via INTRAVENOUS

## 2023-01-16 ENCOUNTER — Inpatient Hospital Stay (HOSPITAL_BASED_OUTPATIENT_CLINIC_OR_DEPARTMENT_OTHER): Payer: Medicare HMO | Admitting: Oncology

## 2023-01-16 ENCOUNTER — Encounter: Payer: Self-pay | Admitting: Oncology

## 2023-01-16 VITALS — BP 145/56 | HR 77 | Temp 96.4°F | Resp 18 | Ht 60.0 in | Wt 131.1 lb

## 2023-01-16 DIAGNOSIS — C50511 Malignant neoplasm of lower-outer quadrant of right female breast: Secondary | ICD-10-CM

## 2023-01-16 DIAGNOSIS — C189 Malignant neoplasm of colon, unspecified: Secondary | ICD-10-CM

## 2023-01-16 DIAGNOSIS — Z1509 Genetic susceptibility to other malignant neoplasm: Secondary | ICD-10-CM | POA: Diagnosis not present

## 2023-01-16 DIAGNOSIS — D508 Other iron deficiency anemias: Secondary | ICD-10-CM | POA: Diagnosis not present

## 2023-01-16 DIAGNOSIS — Z1589 Genetic susceptibility to other disease: Secondary | ICD-10-CM

## 2023-01-16 DIAGNOSIS — C186 Malignant neoplasm of descending colon: Secondary | ICD-10-CM | POA: Diagnosis not present

## 2023-01-16 NOTE — Progress Notes (Unsigned)
Patient has some swelling on left side and right side dented in that makes her look lop sided.

## 2023-01-17 ENCOUNTER — Encounter: Payer: Self-pay | Admitting: Oncology

## 2023-01-17 NOTE — Assessment & Plan Note (Addendum)
Pathology results are reviewed and discussed with patient. 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  Benefit of adjuvant 5-FU monotherapy in stage II dMMR patients  is uncertain.  Adjuvant chemotherapy was not offered.  Initial post operative Signatera ctDNA was positive. T4b disease, s/p radiation  12/20/2022 Repeat Signatera ctDNA negative.  01/12/23  CT scan showed No evidence of lymphadenopathy or metastatic disease in the chest, abdomen, or pelvis Continue surveillance, and follow up in 6 months. She needs repeat colonoscopy in Jan 2025

## 2023-01-17 NOTE — Assessment & Plan Note (Signed)
Previously s/p Venofer treatments.  Lab Results  Component Value Date   HGB 12.2 01/09/2023   TIBC 315 01/09/2023   IRONPCTSAT 22 01/09/2023   FERRITIN 84 01/09/2023    Stable, no need for Venofer

## 2023-01-17 NOTE — Assessment & Plan Note (Addendum)
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  Increased risk of developing colon cancer, ovarian cancer, endometrial cancer, etc.   Patient has discussed genetic testing results with genetic counselor, and she knows the recommendation of first-degree relatives to be screened. Patient has had partial hysterectomy, ? One ovary left. She will get surveillance CT scan for colon cancer.  Continue annual screening mammogram.

## 2023-01-17 NOTE — Progress Notes (Signed)
Hematology/Oncology Progress note [phone] [phone]       CHIEF COMPLAINTS/PURPOSE OF CONSULTATION:  Descending colon cancer, Lynch syndrome  ASSESSMENT & PLAN:   Cancer Staging  Malignant neoplasm of colon [facility]) Staging form: Colon and Rectum, AJCC 8th Edition - Clinical stage from 06/27/2022: Stage IIC (cT4b, cN0, cM0) - Signed by Rickard Patience, MD on 07/05/2022 - Pathologic stage from 07/29/2022: Stage IIC (pT4b, pN0, cM0) - Signed by Rickard Patience, MD on 10/03/2022   Malignant neoplasm of colon [facility]) Pathology results are reviewed and discussed with patient. 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  Benefit of adjuvant 5-FU monotherapy in stage II dMMR patients  is uncertain.  Adjuvant chemotherapy was not offered.  Initial post operative Signatera ctDNA was positive. T4b disease, s/p radiation  12/20/2022 Repeat Signatera ctDNA negative.  01/12/23  CT scan showed No evidence of lymphadenopathy or metastatic disease in the chest, abdomen, or pelvis Continue surveillance, and follow up in 6 months. She needs repeat colonoscopy in Jan 2025  Primary cancer of lower-outer quadrant of right breast (HCC) 4 millimeter, T1a, N0; ER 90%, PR 0%, HER-2/neu negative. Declined radiation therapy.  She finished 6 years of Letrozole ad now is off treatment.  previously managed by Dr. Lemar Livings.   Iron deficiency anemia Previously s/p Venofer treatments.  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  Increased risk of developing colon cancer, ovarian cancer, endometrial cancer, etc.   Patient has discussed genetic testing results with genetic counselor, and she knows the recommendation of first-degree  relatives to be screened. Patient has had partial hysterectomy, ? One ovary left. She will get surveillance CT scan for colon cancer.  Continue annual screening mammogram.    Orders Placed This Encounter  Procedures   CT CHEST ABDOMEN PELVIS W CONTRAST    Standing Status:   Future    Standing Expiration Date:   01/16/2024    Order Specific Question:   If indicated for the ordered procedure, I authorize the administration of contrast media per Radiology protocol    Answer:   Yes    Order Specific Question:   Does the patient have a contrast media/X-ray dye allergy?    Answer:   Yes    Order Specific Question:   Preferred imaging location?    Answer:   [facility]    Order Specific Question:   If indicated for the ordered procedure, I authorize the administration of oral contrast media per Radiology protocol    Answer:   Yes   CBC with Differential (Cancer Center Only)    Standing Status:   Future    Standing Expiration Date:   01/16/2024   CMP (Cancer Center only)    Standing Status:   Future    Standing Expiration Date:   01/16/2024   CEA    Standing Status:   Future    Standing Expiration Date:   01/16/2024   Follow up in 6 months.  All questions were answered. The patient knows to call the clinic with any problems, questions or concerns.  Rickard Patience, MD, PhD [facility] Hematology Oncology 01/16/2023   HISTORY OF PRESENTING ILLNESS:  Kristin Miller 87 y.o.  female presents to establish care for descending colon cancer I have reviewed her chart and materials related to her cancer extensively and collaborated history with the patient. Summary of oncologic history is as follows: Oncology History  Malignant neoplasm of colon (HCC)  06/27/2022 Cancer Staging   Staging form: Colon and Rectum, AJCC 8th Edition - Clinical stage from 06/27/2022: Stage IIC (cT4b, cN0, cM0) - Signed by Rickard Patience, MD on 07/05/2022 Stage prefix: Initial diagnosis Total positive nodes: 0   06/27/2022  Initial Diagnosis   Cancer of descending colon  -Patient was found to have iron deficiency anemia -06/27/2022 colonoscopy showed fungating partially obstructing large mass found in the descending colon.  The mass was circumferential, oozing was present. Biopsy showed invasive adenocarcinoma. -06/27/2022, EGD showed large hiatal hernia, esophagus plaque was found, suspicious for candidiasis.  Cells for cytology obtained.  Normal stomach and examined duodenum. Esophagus pression showed squamous epithelial cells admixed with mixed inflammatory cells and numerous fungal organisms, morphologically consistent with Candida species.  -Dr. Servando Snare has prescribed a course of Diflucan.     07/03/2022 Imaging   CT chest abdomen pelvis with contrast 1. Large irregular annular locally advanced colonic mass centered in the descending colon measuring up to 8.7 x 6.9 x 8.8 cm, with invasion of the pericolonic fat and direct tumor invasion of portions of the left iliopsoas muscle at the level of the superior iliac crest and direct invasion of the left lateral flank muscle wall. Findings are compatible with primary colonic malignancy. No bowel obstruction. 2. No lymphadenopathy or other definite findings of metastatic disease. 3. Two scattered subcentimeter hypodense liver lesions, too small to accurately characterize, indeterminate. Indeterminate left adrenal 1.3 cm nodule. Further evaluation with MRI abdomen without and with IV contrast may be obtained at this time, as clinically warranted. 4. Two scattered tiny solid left pulmonary nodules, largest 0.3 cm, indeterminate. Recommend attention on follow-up chest CT in 3 months. 5. Large hiatal hernia. Mildly patulous lower thoracic esophagus with small amount of layering oral contrast, suggesting esophageal dysmotility and/or gastroesophageal reflux. 6. Chronic severe T12 vertebral compression fracture. 7. Aberrant nonaneurysmal right subclavian artery. 8.  Aortic  Atherosclerosis (ICD10-I70.0).   07/04/2022 Tumor Marker   CEA 9.3   07/10/2022 Imaging   MRI abdomen w wo contrast 1. 11 mm LEFT adrenal lesion is most consistent with an adenoma.Based on current clinical literature, biochemical evaluation to exclude possible functioning adrenal nodule is suggested if not already performed.  2. Locally advanced colonic neoplasm involving musculature in the LEFT lower quadrant including LEFT iliac and oblique insertion sites along the LEFT iliac fossa better demonstrated on recent CT imaging of the chest, abdomen and pelvis. 3. Hepatic cysts.4. Large hiatal hernia, nearly the entire stomach is herniated into the chest.   07/29/2022 Surgery   Patient went left partial colectomy and pelvic wall resection.  Pathology showed A: Colon and abdominal wall, left partial colectomy with resection Invasive colorectal adenocarcinoma, perforated, with associated pericolonic abscess and involving psoas muscle.  Inflammatory response involving left fallopian tube and ovary.  Simple ovarian cyst, tattoo ink, left ptosis and hyperplastic polyps  B.  Pelvic wall resection, Focal invasive colorectal adenocarcinoma involving fibroadipose tissue with abscess and inflamed granulation tissue.  Histology grade: G2, moderately differentiated, no LVI, no perineural invasion, tumor budding score low [0-4] All margins negative for invasive carcinoma, high-grade dysplasia/intramucosal carcinoma and low-grade dysplastic. 32 regional lymph nodes were all negative for tumor involvement.  No tumor deposits.     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 The remainder of testing was normal. The report date is 08/24/2022.  The Multi-Cancer + RNA Panel offered by [facility] includes sequencing and/or deletion/duplication analysis of the following 70 genes:  AIP*, ALK, APC*, ATM*, AXIN2*, BAP1*,  BARD1*, BLM*, BMPR1A*, BRCA1*, BRCA2*, BRIP1*, CDC73*, CDH1*, CDK4, CDKN1B*, CDKN2A, CHEK2*, CTNNA1*, DICER1*, EPCAM, EGFR, FH*, FLCN*, GREM1, HOXB13, KIT, LZTR1, MAX*, MBD4, MEN1*, MET, MITF, MLH1*, MSH2*, MSH3*, MSH6*, MUTYH*, NF1*, NF2*, NTHL1*, PALB2*, PDGFRA, PMS2*, POLD1*, POLE*, POT1*, PRKAR1A*, PTCH1*, PTEN*, RAD51C*, RAD51D*, RB1*, RET, SDHA*, SDHAF2*, SDHB*, SDHC*, SDHD*, SMAD4*, SMARCA4*, SMARCB1*, SMARCE1*, STK11*, SUFU*, TMEM127*, TP53*, TSC1*, TSC2*, VHL*. RNA analysis is performed for * genes.   07/29/2022 Cancer Staging   Staging form: Colon and Rectum, AJCC 8th Edition - Pathologic stage from 07/29/2022: Stage IIC (pT4b, pN0, cM0) - Signed by Rickard Patience, MD on 10/03/2022 Stage prefix: Initial diagnosis Total positive nodes: 0   09/25/2022 Miscellaneous   Signatera positive, MTM readout 0.06    Patient denies unintentional weight loss, blood in his stool, abdominal pain, nausea vomiting. She denies any constipation or difficulty passing bowel movements.  Her stool is dark, she attributes to taking oral iron supplementation.  Family history positive for brother with lung cancer.    INTERVAL HISTORY MALYNDA LUMBRERAS is a 87 y.o. female who has above history reviewed by me today presents for follow up visit for colon cancer.  S/p surgical resection.  She presents to discuss pathology results and adjuvant plan.  Patient is currently on adjuvant radiation.  Overall she tolerates well. She has no new complaints today   MEDICAL HISTORY:  Past Medical History:  Diagnosis Date   Allergy    Anemia    H/O AS A CHILD   Arthritis    Back pain    Breast cancer of upper-outer quadrant of right female breast (HCC) 06/12/2015   4 millimeter, T1a, N0; ER 90%, PR 0%, HER-2/neu not overexpressed.  Declined radiation therapy.   Collar bone fracture 05/28/2013   Fractured Collar bone on left   Dyspnea    Dysrhythmia    h/o heart skipping beat years ago   Frequent headaches    h/o migraines    Hypertension    Osteopenia 04/2018   Bone density without significant interval change since 2017.   Pneumonia     SURGICAL HISTORY: Past Surgical History:  Procedure Laterality Date   ABDOMINAL HYSTERECTOMY  1970   Partial   BREAST BIOPSY Right 05/28/2015   INVASIVE MAMMARY CARCINOMA    BREAST BIOPSY Right 06/12/2015   Wide excision, sentinel lymph node biopsy.   BREAST LUMPECTOMY WITH AXILLARY LYMPH NODE DISSECTION Right 06/12/2015   4 mm, T1a,N0 withDCIS with clear margins. ER: 90%, PR 0%; Her 2 neu not overexpressed.    CATARACT EXTRACTION W/PHACO Left 02/03/2017   Procedure: CATARACT EXTRACTION PHACO AND INTRAOCULAR LENS PLACEMENT (IOC);  Surgeon: Galen Manila, MD;  Location: [facility] OR[facility];  Service: Ophthalmology;  Laterality: Left;  [place]  00:28 AP% 16.3 CDE 4.68 Fluid pack lot # [id number]   CATARACT EXTRACTION W/PHACO Right 03/01/2019   Procedure: CATARACT EXTRACTION PHACO AND INTRAOCULAR LENS PLACEMENT (IOC)  RIGHT;  Surgeon: Galen Manila, MD;  Location: [facility];  Service: Ophthalmology;  Laterality: Right;   COLONOSCOPY     COLONOSCOPY WITH PROPOFOL N/A 06/27/2022   Procedure: COLONOSCOPY WITH PROPOFOL;  Surgeon: Midge Minium, MD;  Location: [facility];  Service:  Endoscopy;  Laterality: N/A;   ESOPHAGOGASTRODUODENOSCOPY (EGD) WITH PROPOFOL N/A 06/27/2022   Procedure: ESOPHAGOGASTRODUODENOSCOPY (EGD) WITH PROPOFOL;  Surgeon: Midge Minium, MD;  Location: [facility];  Service: Endoscopy;  Laterality: N/A;   PARTIAL COLECTOMY N/A 07/29/2022   Procedure: PARTIAL COLECTOMY, open left, RNFA to assist;  Surgeon: Leafy Ro, MD;  Location: [facility];  Service: General;  Laterality: N/A;    SOCIAL HISTORY: Social History   Socioeconomic History   Marital status: Married    Spouse name: ,Delton See   Number of children: 3   Years of education: GED   Highest education level: 12th grade  Occupational History   Occupation: Retired  Tobacco Use   Smoking  status: Never    Passive exposure: Never   Smokeless tobacco: Never   Tobacco comments:    smoking cessation materials not required  Vaping Use   Vaping Use: Never used  Substance and Sexual Activity   Alcohol use: No   Drug use: No   Sexual activity: Not Currently  Other Topics Concern   Not on file  Social History Narrative   Not on file   Social Determinants of Health   Financial Resource Strain: Low Risk  (09/25/2021)   Overall Financial Resource Strain (CARDIA)    Difficulty of Paying Living Expenses: Not hard at all  Food Insecurity: No Food Insecurity (08/26/2022)   Hunger Vital Sign    Worried About Running Out of Food in the Last Year: Never true    Ran Out of Food in the Last Year: Never true  Transportation Needs: No Transportation Needs (08/04/2022)   PRAPARE - Administrator, Civil Service (Medical): No    Lack of Transportation (Non-Medical): No  Physical Activity: Inactive (09/25/2021)   Exercise Vital Sign    Days of Exercise per Week: 0 days    Minutes of Exercise per Session: 0 min  Stress: No Stress Concern Present (09/25/2021)   [employer] of Occupational Health - Occupational Stress Questionnaire    Feeling of Stress : Not at all  Social Connections: Moderately Integrated (09/25/2021)   Social Connection and Isolation Panel [NHANES]    Frequency of Communication with Friends and Family: More than three times a week    Frequency of Social Gatherings with Friends and Family: More than three times a week    Attends Religious Services: More than 4 times per year    Active Member of [employer] or Organizations: No    Attends Banker Meetings: Never    Marital Status: Married  Catering manager Violence: Not At Risk (08/04/2022)   Humiliation, Afraid, Rape, and Kick questionnaire    Fear of Current or Ex-Partner: No    Emotionally Abused: No    Physically Abused: No    Sexually Abused: No    FAMILY HISTORY: Family History  Problem  Relation Age of Onset   Mental illness Mother        Dementia   Stroke Father    Lung cancer Brother    Breast cancer Other        dx under 50   Brain cancer Granddaughter 12       glioblastoma    ALLERGIES:  is allergic to [place] [brimonidine tartrate-timolol], lisinopril, and other.  MEDICATIONS:  Current Outpatient Medications  Medication Sig Dispense Refill   acetaminophen (TYLENOL) 500 MG tablet Take 500 mg by mouth every 6 (six) hours as needed for pain.     latanoprost (XALATAN) 0.005 %  ophthalmic solution 1 drop at bedtime. Eye doctor     losartan-hydrochlorothiazide (HYZAAR) 100-12.5 MG tablet Take 1 tablet by mouth daily. 90 tablet 1   methocarbamol (ROBAXIN) 500 MG tablet Take 1 tablet (500 mg total) by mouth every 8 (eight) hours as needed for muscle spasms. 15 tablet 0   pantoprazole (PROTONIX) 40 MG tablet Take 1 tablet (40 mg total) by mouth 2 (two) times daily before a meal. 60 tablet 2   promethazine-dextromethorphan (PROMETHAZINE-DM) 6.25-15 MG/5ML syrup Take 2.5 mLs by mouth 4 (four) times daily as needed. 118 mL 0   No current facility-administered medications for this visit.    Review of Systems  Constitutional:  Positive for fatigue. Negative for appetite change, chills and fever.  HENT:   Negative for hearing loss and voice change.   Eyes:  Negative for eye problems.  Respiratory:  Negative for chest tightness and cough.   Cardiovascular:  Negative for chest pain.  Gastrointestinal:  Negative for abdominal distention, abdominal pain and blood in stool.       Dark stool  Endocrine: Negative for hot flashes.  Genitourinary:  Negative for difficulty urinating and frequency.   Musculoskeletal:  Negative for arthralgias.  Skin:  Negative for itching and rash.  Neurological:  Negative for extremity weakness.  Hematological:  Negative for adenopathy.  Psychiatric/Behavioral:  Negative for confusion.      PHYSICAL EXAMINATION: ECOG PERFORMANCE STATUS: 1 -  Symptomatic but completely ambulatory  Vitals:   01/16/23 1155  BP: (!) 145/56  Pulse: 77  Resp: 18  Temp: (!) 96.4 F (35.8 C)  SpO2: 100%   Filed Weights   01/16/23 1155  Weight: 131 lb 1.6 oz (59.5 kg)    Physical Exam Constitutional:      General: She is not in acute distress.    Appearance: She is not diaphoretic.  HENT:     Head: Normocephalic and atraumatic.  Eyes:     General: No scleral icterus. Cardiovascular:     Rate and Rhythm: Normal rate.  Pulmonary:     Effort: Pulmonary effort is normal. No respiratory distress.  Abdominal:     General: There is no distension.  Musculoskeletal:        General: Normal range of motion.     Cervical back: Normal range of motion.  Skin:    Findings: No erythema or rash.  Neurological:     Mental Status: She is alert and oriented to person, place, and time. Mental status is at baseline.     Cranial Nerves: No cranial nerve deficit.     Motor: No abnormal muscle tone.     Coordination: Coordination normal.  Psychiatric:        Mood and Affect: Mood and affect normal.      LABORATORY DATA:  I have reviewed the data as listed    Latest Ref Rng & Units 01/09/2023   11:10 AM 12/11/2022    1:40 PM 10/09/2022    9:47 AM  CBC  WBC 4.0 - 10.5 K/uL 8.8  9.9  7.4   Hemoglobin 12.0 - 15.0 g/dL 40.9  81.1  91.4   Hematocrit 36.0 - 46.0 % 37.0  38.2  38.9   Platelets 150 - 400 K/uL 330  299  336       Latest Ref Rng & Units 01/09/2023   11:10 AM 12/11/2022    1:40 PM 08/04/2022    8:11 AM  CMP  Glucose 70 - 99 mg/dL 782  956  84   BUN 8 - 23 mg/dL 20  18  7    Creatinine 0.44 - 1.00 mg/dL 1.61  0.96  0.45   Sodium 135 - 145 mmol/L 139  139  135   Potassium 3.5 - 5.1 mmol/L 3.8  3.7  3.5   Chloride 98 - 111 mmol/L 103  104  105   CO2 22 - 32 mmol/L 27  27  23    Calcium 8.9 - 10.3 mg/dL 9.1  8.8  7.6   Total Protein 6.5 - 8.1 g/dL 6.7  6.7    Total Bilirubin 0.3 - 1.2 mg/dL 0.7  0.4    Alkaline Phos 38 - 126 U/L 61  69     AST 15 - 41 U/L 23  24    ALT 0 - 44 U/L 18  15       RADIOGRAPHIC STUDIES: I have personally reviewed the radiological images as listed and agreed with the findings in the report. CT CHEST ABDOMEN PELVIS W CONTRAST  Result Date: 01/12/2023 CLINICAL DATA:  Colon cancer restaging, status post sigmoid colon resection with colostomy * Tracking Code: BO * EXAM: CT CHEST, ABDOMEN, AND PELVIS WITH CONTRAST TECHNIQUE: Multidetector CT imaging of the chest, abdomen and pelvis was performed following the standard protocol during bolus administration of intravenous contrast. RADIATION DOSE REDUCTION: This exam was performed according to the departmental dose-optimization program which includes automated exposure control, adjustment of the mA and/or kV according to patient size and/or use of iterative reconstruction technique. CONTRAST:  80mL OMNIPAQUE IOHEXOL 300 MG/ML  SOLN COMPARISON:  CT angiogram abdomen and pelvis FINDINGS: CT CHEST FINDINGS Cardiovascular: Aortic atherosclerosis. Incidental note of aberrant retroesophageal origin of the right subclavian artery. Normal heart size. No pericardial effusion. Mediastinum/Nodes: No enlarged mediastinal, hilar, or axillary lymph nodes. Large hiatal hernia with intrathoracic position of the gastric fundus. Thyroid gland, trachea, and esophagus demonstrate no significant findings. Lungs/Pleura: Unchanged 0.3 cm nodule of the posterior left upper lobe (series 3, image 31). Background of very fine centrilobular pulmonary nodules, most concentrated in the lung apices. Dependent bibasilar scarring or atelectasis. No pleural effusion or pneumothorax. Musculoskeletal: No chest wall abnormality. No acute osseous findings. Unchanged high-grade vertebral plana deformity T12 with focal kyphosis of the thoracolumbar spine (series 6, image 82). CT ABDOMEN PELVIS FINDINGS Hepatobiliary: No solid liver abnormality is seen. Small, benign cyst of the liver dome, previously  characterized by MR (series 237). No gallstones, gallbladder wall thickening, or biliary dilatation. Pancreas: Unremarkable. No pancreatic ductal dilatation or surrounding inflammatory changes. Spleen: Normal in size without significant abnormality. Adrenals/Urinary Tract: Unchanged left adrenal nodule, previously characterized by MR as a benign adenoma, for which no further follow-up or characterization is required (series 2, image 63). Duplicated left ureters. Simple, benign left cortical and parapelvic cysts, for which no further follow-up or characterization is required. Kidneys are otherwise normal, without renal calculi, solid lesion, or hydronephrosis. Bladder is unremarkable. Stomach/Bowel: Stomach is within normal limits. Appendix not clearly visualized. No evidence of bowel wall thickening, distention, or inflammatory changes. Evidence of prior sigmoid colon resection with left lower quadrant colostomy rectal stump. Vascular/Lymphatic: Aortic atherosclerosis. No enlarged abdominal or pelvic lymph nodes. Reproductive: Status post hysterectomy. Other: Unchanged, left sided abdominal wall hernia (series 2, image 69). No ascites. Musculoskeletal: No acute osseous findings. IMPRESSION: 1. Status post sigmoid colon resection with left lower quadrant colostomy and rectal stump. 2. No evidence of lymphadenopathy or metastatic disease in the chest, abdomen, or pelvis. 3. Unchanged 0.3 cm  nodule of the posterior left upper lobe, almost certainly benign and incidental. Background of very fine centrilobular pulmonary nodules, most concentrated in the lung apices, most commonly seen in smoking-related respiratory bronchiolitis. 4. Unchanged, broad-based left-sided abdominal wall hernia. 5. Large hiatal hernia. 6. Unchanged high-grade vertebral plana deformity T12 with focal kyphosis of the thoracolumbar spine. Aortic Atherosclerosis (ICD10-I70.0). Electronically Signed   By: Jearld Lesch M.D.   On: 01/12/2023 22:30

## 2023-01-17 NOTE — Assessment & Plan Note (Signed)
4 millimeter, T1a, N0; ER 90%, PR 0%, HER-2/neu negative. Declined radiation therapy.  She finished 6 years of Letrozole ad now is off treatment.  previously managed by Dr. Lemar Livings.

## 2023-02-23 ENCOUNTER — Telehealth: Payer: Self-pay

## 2023-02-23 NOTE — Telephone Encounter (Signed)
Patient due for Signatera lab draw on 8/23.   Please schedule lab (Signatera) on 8/23 @ 11am. Pt aware of appt.

## 2023-03-02 ENCOUNTER — Ambulatory Visit: Payer: Medicare HMO | Admitting: Surgery

## 2023-03-02 ENCOUNTER — Encounter: Payer: Self-pay | Admitting: Surgery

## 2023-03-02 VITALS — BP 162/77 | HR 70 | Temp 98.2°F | Ht 62.0 in | Wt 131.6 lb

## 2023-03-02 DIAGNOSIS — C186 Malignant neoplasm of descending colon: Secondary | ICD-10-CM | POA: Diagnosis not present

## 2023-03-02 DIAGNOSIS — Z433 Encounter for attention to colostomy: Secondary | ICD-10-CM

## 2023-03-02 DIAGNOSIS — K439 Ventral hernia 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 gangrene: Secondary | ICD-10-CM | POA: Diagnosis not present

## 2023-03-02 NOTE — Patient Instructions (Signed)
Minimally Invasive Partial Colectomy, Adult, Care After The following information offers guidance on how to care for yourself after your procedure. Your health care provider may also give you more specific instructions. If you have problems or questions, contact your health care provider. What can I expect after the surgery? After the procedure, it is common to have: Pain, bruising, and swelling. Bloating. Weakness and tiredness (fatigue). Changes to your bowel movements, especially having bowel movements more often. Follow these instructions at home: Medicines Take over-the-counter and prescription medicines only as told by your health care provider. If you were prescribed an antibiotic medicine, take it as told by your health care provider. Do not stop using the antibiotic even if you start to feel better. Ask your health care provider if the medicine prescribed to you: Requires you to avoid driving or using machinery. Can cause constipation. You may need to take these actions to prevent or treat constipation: Drink enough fluids to keep your urine pale yellow. Take over-the-counter or prescription medicines. Limit foods that are high in fat and processed sugars, such as fried or sweet foods. Eating and drinking Follow instructions from your health care provider about what you may eat and drink. Do not drink alcohol if your health care provider tells you not to drink. Eat a low-fiber diet for the first 4 weeks after surgery or as told by your health care provider.. Most people on a low-fiber eating plan should eat less than 10 grams (g) of fiber a day. Follow recommendations from your health care provider or dietitian about how much fiber you should have each day. Always check food labels to know the fiber content of packaged foods. In general, a low-fiber food will have fewer than 2 g of fiber per serving. In general, try to avoid whole grains, raw fruits and vegetables, dried fruit, tough  cuts of meat, nuts, and seeds. Incision care  Follow instructions from your health care provider about how to take care of your incisions. Make sure you: Wash your hands with soap and water for at least 20 seconds before and after you change your bandage (dressing). If soap and water are not available, use hand sanitizer. Change your dressing as told by your health care provider. Leave stitches (sutures), skin glue, or adhesive strips in place. These skin closures may need to stay in place for 2 weeks or longer. If adhesive strip edges start to loosen and curl up, you may trim the loose edges. Do not remove adhesive strips completely unless your health care provider tells you to do that. Check your incision area every day for signs of infection. Check for: More redness, swelling, or pain. Fluid or blood. Warmth. Pus or a bad smell. Activity Rest as told by your health care provider. Avoid sitting for a long time without moving. Get up to take short walks every 1-2 hours. This is important to improve blood flow and breathing. Ask for help if you feel weak or unsteady. You may have to avoid lifting. Ask your health care provider how much you can safely lift. Return to your normal activities as told by your health care provider. Ask your health care provider what activities are safe for you. General instructions Do not use any products that contain nicotine or tobacco. These products include cigarettes, chewing tobacco, and vaping devices, such as e-cigarettes. If you need help quitting, ask your health care provider. Do not take baths, swim, or use a hot tub until your health care provider  approves. Ask your health care provider if you may take showers. You may only be allowed to take sponge baths. Wear compression stockings as told by your health care provider. These stockings help to prevent blood clots and reduce swelling in your legs. Keep all follow-up visits. This is important to monitor  healing and check for any complications. Contact a health care provider if: Medicine is not controlling your pain. You have chills or fever. You have any signs of infection in your incision areas. You have a persistent cough. You have nausea or vomiting. You develop a rash. You have not had a bowel movement in 3 days. Get help right away if: You have severe pain. Your incisions break open after sutures or staples have been removed. You are bleeding from your rectum or have blood in your stool. You have a warm, tender swelling in your leg. You have chest pain or trouble breathing. You have increased swelling in the abdomen. You feel light-headed or you faint. These symptoms may be an emergency. Get help right away. Call 911. Do not wait to see if the symptoms will go away. Do not drive yourself to the hospital. Summary After surgery, it is common to have some pain, bruising, swelling, bloating, tiredness, weakness, or changes to your bowel movements. Follow instructions from your health care provider about what to eat and drink. Return to your normal activities as told by your health care provider. Check your incision area every day for signs of infection. Get help right away if you have chest pain or trouble breathing. This information is not intended to replace advice given to you by your health care provider. Make sure you discuss any questions you have with your health care provider. Document Revised: 10/23/2021 Document Reviewed: 10/23/2021 Elsevier Patient Education  2024 [employer].

## 2023-03-02 NOTE — Progress Notes (Signed)
Outpatient Surgical Follow Up  03/02/2023  Kristin Miller is an 87 y.o. female.   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  She completed 5 weeks of adjuvant XRT, She is doing well considering her disease.  She is tolerating diet she is ambulating and her colostomy is working well.  No fevers no chills.  She declined adjuvant chemo.  Is doing well and has noticed a left flank bulge.  No pain. Eating well and her colostomy is working well. Did have a recent CT that I personally reviewed showing no evidence of metastatic disease.  A left flank hernia without evidence of complications.  Past Medical History:  Diagnosis Date   Allergy    Anemia    H/O AS A CHILD   Arthritis    Back pain    Breast cancer of upper-outer quadrant of right female breast (HCC) 06/12/2015   4 millimeter, T1a, N0; ER 90%, PR 0%, HER-2/neu not overexpressed.  Declined radiation therapy.   Collar bone fracture 05/28/2013   Fractured Collar bone on left   Dyspnea    Dysrhythmia    h/o heart skipping beat years ago   Frequent headaches    h/o migraines   Hypertension    Osteopenia 04/2018   Bone density without significant interval change since 2017.   Pneumonia     Past Surgical History:  Procedure Laterality Date   ABDOMINAL HYSTERECTOMY  1970   Partial   BREAST BIOPSY Right 05/28/2015   INVASIVE MAMMARY CARCINOMA    BREAST BIOPSY Right 06/12/2015   Wide excision, sentinel lymph node biopsy.   BREAST LUMPECTOMY WITH AXILLARY LYMPH NODE DISSECTION Right 06/12/2015   4 mm, T1a,N0 withDCIS with clear margins. ER: 90%, PR 0%; Her 2 neu not overexpressed.    CATARACT EXTRACTION W/PHACO Left 02/03/2017   Procedure: CATARACT EXTRACTION PHACO AND INTRAOCULAR LENS PLACEMENT (IOC);  Surgeon: Galen Manila,  MD;  Location: [facility];  Service: Ophthalmology;  Laterality: Left;  [place]  00:28 AP% 16.3 CDE 4.68 Fluid pack lot # [id number]   CATARACT EXTRACTION W/PHACO Right 03/01/2019   Procedure: CATARACT EXTRACTION PHACO AND INTRAOCULAR LENS PLACEMENT (IOC)  RIGHT;  Surgeon: Galen Manila, MD;  Location: [facility];  Service: Ophthalmology;  Laterality: Right;   COLONOSCOPY     COLONOSCOPY WITH PROPOFOL N/A 06/27/2022   Procedure: COLONOSCOPY WITH PROPOFOL;  Surgeon: Midge Minium, MD;  Location: [facility];  Service: Endoscopy;  Laterality: N/A;   ESOPHAGOGASTRODUODENOSCOPY (EGD) WITH PROPOFOL N/A 06/27/2022   Procedure: ESOPHAGOGASTRODUODENOSCOPY (EGD) WITH PROPOFOL;  Surgeon: Midge Minium, MD;  Location: [facility];  Service: Endoscopy;  Laterality: N/A;   PARTIAL COLECTOMY N/A 07/29/2022   Procedure: PARTIAL COLECTOMY, open left, RNFA to assist;  Surgeon: Leafy Ro, MD;  Location: [facility];  Service: General;  Laterality: N/A;    Family History  Problem Relation Age of Onset   Mental illness Mother        Dementia   Stroke Father    Lung cancer Brother    Breast cancer Other        dx under 16   Brain cancer Granddaughter 12       glioblastoma    Social History:  reports that she has never smoked. She has never been  exposed to tobacco smoke. She has never used smokeless tobacco. She reports that she does not drink alcohol and does not use drugs.  Allergies:  Allergies  Allergen Reactions   Combigan [Brimonidine Tartrate-Timolol]     Redness, burning, irritation    Lisinopril Cough   Other Rash    SUTURES-(PARTIAL HYSTERECTOMY)    Medications reviewed.    ROS Full ROS performed and is otherwise negative other than what is stated in HPI   BP (!) 162/77   Pulse 70   Temp 98.2 F (36.8 C) (Oral)   Ht 5\' 2"  (1.575 m)   Wt 131 lb 9.6 oz (59.7 kg)   SpO2 95%   BMI 24.07 kg/m   Physical Exam  Physical Exam Vitals and nursing note reviewed.  Exam conducted with a chaperone present.  Constitutional:      General: She is not in acute distress.    Appearance: Normal appearance. She is normal weight.  Cardiovascular:     Rate and Rhythm: Normal rate and regular rhythm.     Heart sounds: No murmur heard.    No friction rub.  Pulmonary:     Effort: Pulmonary effort is normal. No respiratory distress.     Breath sounds: Normal breath sounds. No stridor. No wheezing or rhonchi.  Abdominal:     General: Abdomen is flat. There is no distension.     Palpations: Abdomen is soft. There is no mass.     Tenderness: There is no abdominal tenderness.     Hernia: No hernia is present.     Comments: Ostomy in place working on widely patent.  No infection.  Midline scar healed . There is a good size left flank hernia defect Musculoskeletal:        General: Normal range of motion.     Cervical back: Normal range of motion and neck supple. No rigidity or tenderness.  Skin:    General: Skin is warm and dry.     Capillary Refill: Capillary refill takes less than 2 seconds.  Neurological:     General: No focal deficit present.     Mental Status: She is alert and oriented to person, place, and time.  Psychiatric:        Mood and Affect: Mood normal.        Behavior: Behavior normal.        Thought Content: Thought content normal.        Judgment: Judgment normal.    Assessment/Plan: 87 year old female with history of colon cancer T4 invading into the musculature status post radical resection with some abdominal wall resection followed by adjuvant radiation therapy.  Not surprisingly she has developed a flank hernia likely a combination of surgery and radiation therapy.  Shortly she is not symptomatic.  Extensive discussion with the patient regarding her disease process.  This is a good sized hernia at that will likely require an open approach with potential mesh.  This in the setting of the colostomy make things very complicated.  Already at 22  and given the potential extent of major surgical intervention I do not necessarily recommend any repair at this time. I will see her back in 3-6 months  Please note that I spent 30 minutes in this encounter including personally reviewing imaging studies, coordinating her care, placing orders, counseling the patient and performing documentation   Sterling Big, MD [facility] General Surgeon

## 2023-03-13 ENCOUNTER — Inpatient Hospital Stay: Payer: Medicare HMO | Attending: Oncology

## 2023-03-13 DIAGNOSIS — C189 Malignant neoplasm of colon, unspecified: Secondary | ICD-10-CM

## 2023-03-23 LAB — SIGNATERA
SIGNATERA MTM READOUT: 0 MTM/ml
SIGNATERA TEST RESULT: NEGATIVE

## 2023-03-24 ENCOUNTER — Encounter: Payer: Self-pay | Admitting: Oncology

## 2023-03-27 LAB — GENETIC SCREENING ORDER

## 2023-04-20 ENCOUNTER — Ambulatory Visit: Payer: Medicare HMO | Admitting: Radiation Oncology

## 2023-04-30 ENCOUNTER — Ambulatory Visit
Admission: RE | Admit: 2023-04-30 | Discharge: 2023-04-30 | Disposition: A | Discharge status: Home / Self Care | Payer: Medicare HMO | Source: Ambulatory Visit | Attending: Radiation Oncology | Admitting: Radiation Oncology

## 2023-04-30 ENCOUNTER — Encounter: Payer: Self-pay | Admitting: Radiation Oncology

## 2023-04-30 VITALS — BP 136/52 | HR 70 | Temp 97.6°F | Resp 16 | Wt 137.0 lb

## 2023-04-30 DIAGNOSIS — Z933 Colostomy status: Secondary | ICD-10-CM | POA: Insufficient documentation

## 2023-04-30 DIAGNOSIS — C186 Malignant neoplasm of descending colon: Secondary | ICD-10-CM | POA: Insufficient documentation

## 2023-04-30 DIAGNOSIS — Z923 Personal history of irradiation: Secondary | ICD-10-CM | POA: Insufficient documentation

## 2023-04-30 DIAGNOSIS — M549 Dorsalgia, unspecified: Secondary | ICD-10-CM | POA: Diagnosis not present

## 2023-04-30 NOTE — Progress Notes (Signed)
Radiation Oncology Follow up Note  Name: Kristin Miller   Date:   04/30/2023 MRN:  [id number] DOB: Oct 15, 1935    This 87 y.o. 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  REFERRING PROVIDER: Araceli Bouche, DO  HPI: Patient is a 87 year old female now out 6 months having received adjuvant radiation therapy based on T4b status of adenocarcinoma descending colon with involvement of the psoas muscle and pelvic sidewall.  Seen today in routine follow-up she is doing well.  Colostomy is functioning well.  She has some back pain really do not think associated with her cancer status..  She had a CT scan back in June which I reviewed showing no evidence of lymphadenopathy or metastatic disease in chest abdomen or pelvis.  COMPLICATIONS OF TREATMENT: none  FOLLOW UP COMPLIANCE: keeps appointments   PHYSICAL EXAM:  BP (!) 136/52 Comment: Patient will increase her fluids  Pulse 70   Temp 97.6 F (36.4 C) (Tympanic)   Resp 16   Wt 137 lb (62.1 kg)   BMI 25.06 kg/m colostomy is functioning well Well-developed well-nourished patient in NAD. HEENT reveals PERLA, EOMI, discs not visualized.  Oral cavity is clear. No oral mucosal lesions are identified. Neck is clear without evidence of cervical or supraclavicular adenopathy. Lungs are clear to A&P. Cardiac examination is essentially unremarkable with regular rate and rhythm without murmur rub or thrill. Abdomen is benign with no organomegaly or masses noted. Motor sensory and DTR levels are equal and symmetric in the upper and lower extremities. Cranial nerves II through XII are grossly intact. Proprioception is intact. No peripheral adenopathy or edema is identified. No motor or sensory levels are noted. Crude visual fields are within normal  range.  RADIOLOGY RESULTS: CT scan reviewed compatible with above-stated findings  PLAN: At the present time patient is now at 6 months and adjuvant radiation therapy for T4b involvement of adenocarcinoma this descending colon.  She clinically is doing well.  I have asked to see her back in 6 months for follow-up.  She continues close observation by medical oncology.  Patient is to call with any concerns.  I would like to take this opportunity to thank you for allowing me to participate in the care of your patient.Kristin Miller, MD

## 2023-05-11 ENCOUNTER — Ambulatory Visit: Payer: Medicare HMO | Admitting: Internal Medicine

## 2023-05-25 ENCOUNTER — Telehealth: Payer: Self-pay

## 2023-05-25 NOTE — Telephone Encounter (Signed)
Pt due for [facility] Lab draw on 11/25.   Please schedule Lab ([facility] only) on 11/25 @10 :45. Pt aware of appt details

## 2023-06-04 ENCOUNTER — Encounter: Payer: Self-pay | Admitting: Oncology

## 2023-06-04 NOTE — Telephone Encounter (Signed)
Per message "colostomy bag has not had much significant action since Monday. ". Please advise.

## 2023-06-15 ENCOUNTER — Inpatient Hospital Stay: Payer: Medicare HMO | Attending: Oncology

## 2023-06-15 DIAGNOSIS — C189 Malignant neoplasm of colon, unspecified: Secondary | ICD-10-CM

## 2023-06-23 ENCOUNTER — Encounter: Payer: Self-pay | Admitting: Oncology

## 2023-06-23 LAB — SIGNATERA
SIGNATERA MTM READOUT: 0 MTM/ml
SIGNATERA TEST RESULT: NEGATIVE

## 2023-07-10 ENCOUNTER — Ambulatory Visit
Admission: RE | Admit: 2023-07-10 | Discharge: 2023-07-10 | Disposition: A | Discharge status: Home / Self Care | Payer: Medicare HMO | Source: Ambulatory Visit | Attending: Oncology | Admitting: Oncology

## 2023-07-10 ENCOUNTER — Inpatient Hospital Stay: Payer: Medicare HMO | Attending: Oncology

## 2023-07-10 DIAGNOSIS — Z08 Encounter for follow-up examination after completed treatment for malignant neoplasm: Secondary | ICD-10-CM | POA: Insufficient documentation

## 2023-07-10 DIAGNOSIS — D509 Iron deficiency anemia, unspecified: Secondary | ICD-10-CM | POA: Diagnosis not present

## 2023-07-10 DIAGNOSIS — Z1509 Genetic susceptibility to other malignant neoplasm: Secondary | ICD-10-CM | POA: Insufficient documentation

## 2023-07-10 DIAGNOSIS — Z85038 Personal history of other malignant neoplasm of large intestine: Secondary | ICD-10-CM | POA: Insufficient documentation

## 2023-07-10 DIAGNOSIS — C189 Malignant neoplasm of colon, unspecified: Secondary | ICD-10-CM

## 2023-07-10 LAB — CBC WITH DIFFERENTIAL (CANCER CENTER ONLY)
Abs Immature Granulocytes: 0.1 10*3/uL — ABNORMAL HIGH (ref 0.00–0.07)
Basophils Absolute: 0.1 10*3/uL (ref 0.0–0.1)
Basophils Relative: 1 %
Eosinophils Absolute: 0.2 10*3/uL (ref 0.0–0.5)
Eosinophils Relative: 3 %
HCT: 41.9 % (ref 36.0–46.0)
Hemoglobin: 13.2 g/dL (ref 12.0–15.0)
Immature Granulocytes: 1 %
Lymphocytes Relative: 22 %
Lymphs Abs: 1.8 10*3/uL (ref 0.7–4.0)
MCH: 28.1 pg (ref 26.0–34.0)
MCHC: 31.5 g/dL (ref 30.0–36.0)
MCV: 89.3 fL (ref 80.0–100.0)
Monocytes Absolute: 0.9 10*3/uL (ref 0.1–1.0)
Monocytes Relative: 11 %
Neutro Abs: 5.1 10*3/uL (ref 1.7–7.7)
Neutrophils Relative %: 62 %
Platelet Count: 314 10*3/uL (ref 150–400)
RBC: 4.69 MIL/uL (ref 3.87–5.11)
RDW: 12.9 % (ref 11.5–15.5)
WBC Count: 8.1 10*3/uL (ref 4.0–10.5)
nRBC: 0 % (ref 0.0–0.2)

## 2023-07-10 LAB — CMP (CANCER CENTER ONLY)
ALT: 13 U/L (ref 0–44)
AST: 18 U/L (ref 15–41)
Albumin: 3.8 g/dL (ref 3.5–5.0)
Alkaline Phosphatase: 60 U/L (ref 38–126)
Anion gap: 11 (ref 5–15)
BUN: 18 mg/dL (ref 8–23)
CO2: 26 mmol/L (ref 22–32)
Calcium: 9.4 mg/dL (ref 8.9–10.3)
Chloride: 100 mmol/L (ref 98–111)
Creatinine: 0.77 mg/dL (ref 0.44–1.00)
GFR, Estimated: >60 mL/min (ref >60)
Glucose, Bld: 100 mg/dL — ABNORMAL HIGH (ref 70–99)
Potassium: 3.6 mmol/L (ref 3.5–5.1)
Sodium: 137 mmol/L (ref 135–145)
Total Bilirubin: 0.5 mg/dL (ref <1.2)
Total Protein: 6.8 g/dL (ref 6.5–8.1)

## 2023-07-10 MED ORDER — BARIUM SULFATE 2 % PO SUSP
900.0000 mL | Freq: Once | ORAL | Status: AC
  Administered 2023-07-10: 900 mL via ORAL

## 2023-07-10 MED ORDER — IOHEXOL 300 MG/ML  SOLN
80.0000 mL | Freq: Once | INTRAMUSCULAR | Status: AC | PRN
  Administered 2023-07-10: 80 mL via INTRAVENOUS

## 2023-07-11 LAB — CEA: CEA: 1.9 ng/mL (ref 0.0–4.7)

## 2023-07-17 ENCOUNTER — Ambulatory Visit: Payer: Medicare HMO | Admitting: Oncology

## 2023-07-20 ENCOUNTER — Encounter: Payer: Self-pay | Admitting: Oncology

## 2023-07-20 ENCOUNTER — Inpatient Hospital Stay (HOSPITAL_BASED_OUTPATIENT_CLINIC_OR_DEPARTMENT_OTHER): Payer: Medicare HMO | Admitting: Oncology

## 2023-07-20 VITALS — BP 156/89 | HR 88 | Temp 98.1°F | Resp 18 | Wt 136.7 lb

## 2023-07-20 DIAGNOSIS — S22000A Wedge compression fracture of unspecified thoracic vertebra, initial encounter for closed fracture: Secondary | ICD-10-CM | POA: Insufficient documentation

## 2023-07-20 DIAGNOSIS — C50511 Malignant neoplasm of lower-outer quadrant of right female breast: Secondary | ICD-10-CM

## 2023-07-20 DIAGNOSIS — C186 Malignant neoplasm of descending colon: Secondary | ICD-10-CM | POA: Diagnosis not present

## 2023-07-20 DIAGNOSIS — Z1509 Genetic susceptibility to other malignant neoplasm: Secondary | ICD-10-CM

## 2023-07-20 DIAGNOSIS — Z1589 Genetic susceptibility to other disease: Secondary | ICD-10-CM

## 2023-07-20 DIAGNOSIS — Z08 Encounter for follow-up examination after completed treatment for malignant neoplasm: Secondary | ICD-10-CM | POA: Diagnosis not present

## 2023-07-20 DIAGNOSIS — D508 Other iron deficiency anemias: Secondary | ICD-10-CM | POA: Diagnosis not present

## 2023-07-20 NOTE — Assessment & Plan Note (Addendum)
4 millimeter, T1a, N0; ER 90%, PR 0%, HER-2/neu negative. Declined radiation therapy.  She finished 6 years of Letrozole ad now is off treatment.  previously managed by Dr. Lemar Livings.  Will obtain screening mammogram

## 2023-07-20 NOTE — Assessment & Plan Note (Signed)
Previously s/p Venofer treatments.  Lab Results  Component Value Date   HGB 13.2 07/10/2023   TIBC 315 01/09/2023   IRONPCTSAT 22 01/09/2023   FERRITIN 84 01/09/2023    Stable, no need for Venofer

## 2023-07-20 NOTE — Assessment & Plan Note (Addendum)
Patient has osteoporosis. Recommend patient to discuss with PCP regarding bisphosphonate treatment.  Encourage calcium and vitamin D supplementation.

## 2023-07-20 NOTE — Assessment & Plan Note (Addendum)
Pathology results are reviewed and discussed with patient. 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  Benefit of adjuvant 5-FU monotherapy in stage II dMMR patients  is uncertain.  Adjuvant chemotherapy was not offered.  Initial post operative [facility] ctDNA was positive. T4b disease, s/p radiation  Dec 2024 Repeat [facility] ctDNA negative.  Dec 2024  CT scan showed No evidence of lymphadenopathy or metastatic disease in the chest, abdomen, or pelvis Continue surveillance, and follow up in 6 months. She needs repeat colonoscopy in Jan 2025- recommend patient to call GI office.

## 2023-07-20 NOTE — Progress Notes (Signed)
Hematology/Oncology Progress note [phone] [phone]       CHIEF COMPLAINTS/PURPOSE OF CONSULTATION:  Descending colon cancer, Lynch syndrome  ASSESSMENT & PLAN:   Cancer Staging  Malignant neoplasm of colon [facility]) Staging form: Colon and Rectum, AJCC 8th Edition - Clinical stage from 06/27/2022: Stage IIC (cT4b, cN0, cM0) - Signed by Rickard Patience, MD on 07/05/2022 - Pathologic stage from 07/29/2022: Stage IIC (pT4b, pN0, cM0) - Signed by Rickard Patience, MD on 10/03/2022   Malignant neoplasm of colon [facility]) Pathology results are reviewed and discussed with patient. 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  Benefit of adjuvant 5-FU monotherapy in stage II dMMR patients  is uncertain.  Adjuvant chemotherapy was not offered.  Initial post operative Signatera ctDNA was positive. T4b disease, s/p radiation  Dec 2024 Repeat Signatera ctDNA negative.  Dec 2024  CT scan showed No evidence of lymphadenopathy or metastatic disease in the chest, abdomen, or pelvis Continue surveillance, and follow up in 6 months. She needs repeat colonoscopy in Jan 2025- recommend patient to call GI office.   Iron deficiency anemia Previously s/p Venofer treatments.  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  Increased risk of developing colon cancer, ovarian cancer, endometrial cancer, etc.   Patient has discussed genetic testing results with genetic counselor, and she knows the recommendation of first-degree relatives to be screened. Patient has had partial hysterectomy, ? One ovary left. She will get surveillance CT scan for colon cancer.  Continue annual screening mammogram.    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 Declined radiation therapy.  She finished 6 years of Letrozole ad now is off treatment.  previously managed by Dr. Lemar Livings.  Will obtain screening mammogram  Compression fracture of body of thoracic vertebra [name]) Patient has osteoporosis. Recommend patient to discuss with PCP regarding bisphosphonate treatment.  Encourage calcium and vitamin D supplementation.   Orders Placed This Encounter  Procedures   CT CHEST ABDOMEN PELVIS W CONTRAST    Standing Status:   Future    Expected Date:   01/18/2024    Expiration Date:   07/19/2024    If indicated for the ordered procedure, I authorize the administration of contrast media per Radiology protocol:   Yes    Does the patient have a contrast media/X-ray dye allergy?:   No    Preferred imaging location?:   [facility]    If indicated for the ordered procedure, I authorize the administration of oral contrast media per Radiology protocol:   Yes   CBC with Differential (Cancer Center Only)    Standing Status:   Future    Expected Date:   01/18/2024    Expiration Date:   07/19/2024   CMP (Cancer Center only)    Standing Status:   Future    Expected Date:   01/18/2024    Expiration Date:   07/19/2024   CEA    Standing Status:   Future    Expected Date:   01/18/2024    Expiration Date:   07/19/2024   Follow up in 6 months.  All questions were answered. The  patient knows to call the clinic with any problems, questions or concerns.  Rickard Patience, MD, PhD [facility] Hematology Oncology 07/20/2023   HISTORY OF PRESENTING ILLNESS:  Kristin Miller 87 y.o. female presents to establish care for descending colon cancer I have reviewed her chart and materials related to her cancer extensively and collaborated history with the patient. Summary of oncologic history is as follows: Oncology History  Malignant neoplasm of colon (HCC)  06/27/2022 Cancer Staging    Staging form: Colon and Rectum, AJCC 8th Edition - Clinical stage from 06/27/2022: Stage IIC (cT4b, cN0, cM0) - Signed by Rickard Patience, MD on 07/05/2022 Stage prefix: Initial diagnosis Total positive nodes: 0   06/27/2022 Initial Diagnosis   Cancer of descending colon  -Patient was found to have iron deficiency anemia -06/27/2022 colonoscopy showed fungating partially obstructing large mass found in the descending colon.  The mass was circumferential, oozing was present. Biopsy showed invasive adenocarcinoma. -06/27/2022, EGD showed large hiatal hernia, esophagus plaque was found, suspicious for candidiasis.  Cells for cytology obtained.  Normal stomach and examined duodenum. Esophagus pression showed squamous epithelial cells admixed with mixed inflammatory cells and numerous fungal organisms, morphologically consistent with Candida species.  -Dr. Servando Snare has prescribed a course of Diflucan.     07/03/2022 Imaging   CT chest abdomen pelvis with contrast 1. Large irregular annular locally advanced colonic mass centered in the descending colon measuring up to 8.7 x 6.9 x 8.8 cm, with invasion of the pericolonic fat and direct tumor invasion of portions of the left iliopsoas muscle at the level of the superior iliac crest and direct invasion of the left lateral flank muscle wall. Findings are compatible with primary colonic malignancy. No bowel obstruction. 2. No lymphadenopathy or other definite findings of metastatic disease. 3. Two scattered subcentimeter hypodense liver lesions, too small to accurately characterize, indeterminate. Indeterminate left adrenal 1.3 cm nodule. Further evaluation with MRI abdomen without and with IV contrast may be obtained at this time, as clinically warranted. 4. Two scattered tiny solid left pulmonary nodules, largest 0.3 cm, indeterminate. Recommend attention on follow-up chest CT in 3 months. 5. Large hiatal hernia. Mildly patulous lower thoracic esophagus with small  amount of layering oral contrast, suggesting esophageal dysmotility and/or gastroesophageal reflux. 6. Chronic severe T12 vertebral compression fracture. 7. Aberrant nonaneurysmal right subclavian artery. 8.  Aortic Atherosclerosis (ICD10-I70.0).   07/04/2022 Tumor Marker   CEA 9.3   07/10/2022 Imaging   MRI abdomen w wo contrast 1. 11 mm LEFT adrenal lesion is most consistent with an adenoma.Based on current clinical literature, biochemical evaluation to exclude possible functioning adrenal nodule is suggested if not already performed.  2. Locally advanced colonic neoplasm involving musculature in the LEFT lower quadrant including LEFT iliac and oblique insertion sites along the LEFT iliac fossa better demonstrated on recent CT imaging of the chest, abdomen and pelvis. 3. Hepatic cysts.4. Large hiatal hernia, nearly the entire stomach is herniated into the chest.   07/29/2022 Surgery   Patient went left partial colectomy and pelvic wall resection.  Pathology showed A: Colon and abdominal wall, left partial colectomy with resection Invasive colorectal adenocarcinoma, perforated, with associated pericolonic abscess and involving psoas muscle.  Inflammatory response involving left fallopian tube and ovary.  Simple ovarian cyst, tattoo ink, left ptosis and hyperplastic polyps  B.  Pelvic wall resection, Focal invasive colorectal adenocarcinoma involving fibroadipose tissue with abscess and inflamed granulation tissue.  Histology grade: G2, moderately differentiated, no LVI, no perineural invasion, tumor budding  score low [0-4] All margins negative for invasive carcinoma, high-grade dysplasia/intramucosal carcinoma and low-grade dysplastic. 32 regional lymph nodes were all negative for tumor involvement.  No tumor deposits.     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 The remainder of testing  was normal. The report date is 08/24/2022.  The Multi-Cancer + RNA Panel offered by [facility] includes sequencing and/or deletion/duplication analysis of the following 70 genes:  AIP*, ALK, APC*, ATM*, AXIN2*, BAP1*, BARD1*, BLM*, BMPR1A*, BRCA1*, BRCA2*, BRIP1*, CDC73*, CDH1*, CDK4, CDKN1B*, CDKN2A, CHEK2*, CTNNA1*, DICER1*, EPCAM, EGFR, FH*, FLCN*, GREM1, HOXB13, KIT, LZTR1, MAX*, MBD4, MEN1*, MET, MITF, MLH1*, MSH2*, MSH3*, MSH6*, MUTYH*, NF1*, NF2*, NTHL1*, PALB2*, PDGFRA, PMS2*, POLD1*, POLE*, POT1*, PRKAR1A*, PTCH1*, PTEN*, RAD51C*, RAD51D*, RB1*, RET, SDHA*, SDHAF2*, SDHB*, SDHC*, SDHD*, SMAD4*, SMARCA4*, SMARCB1*, SMARCE1*, STK11*, SUFU*, TMEM127*, TP53*, TSC1*, TSC2*, VHL*. RNA analysis is performed for * genes.   07/29/2022 Cancer Staging   Staging form: Colon and Rectum, AJCC 8th Edition - Pathologic stage from 07/29/2022: Stage IIC (pT4b, pN0, cM0) - Signed by Rickard Patience, MD on 10/03/2022 Stage prefix: Initial diagnosis Total positive nodes: 0   09/25/2022 Miscellaneous   Signatera positive, MTM readout 0.06    Patient denies unintentional weight loss, blood in his stool, abdominal pain, nausea vomiting. She denies any constipation or difficulty passing bowel movements.  Her stool is dark, she attributes to taking oral iron supplementation.  Family history positive for brother with lung cancer.    INTERVAL HISTORY TYNIYA MAIZE is a 86 y.o. female who has above history reviewed by me today presents for follow up visit for Stage II T4b N0 dMMR colon cancer, s/p surgical resection and radiation.  She is doing well.  S/p fall 3-4 weeks ago.    MEDICAL HISTORY:  Past Medical History:  Diagnosis Date   Allergy    Anemia    H/O AS A CHILD   Arthritis    Back pain    Breast cancer of upper-outer quadrant of right female breast (HCC) 06/12/2015   4 millimeter, T1a, N0; ER 90%, PR 0%, HER-2/neu not overexpressed.  Declined radiation therapy.   Collar bone fracture 05/28/2013   Fractured  Collar bone on left   Dyspnea    Dysrhythmia    h/o heart skipping beat years ago   Frequent headaches    h/o migraines   Hypertension    Osteopenia 04/2018   Bone density without significant interval change since 2017.   Pneumonia     SURGICAL HISTORY: Past Surgical History:  Procedure Laterality Date   ABDOMINAL HYSTERECTOMY  1970   Partial   BREAST BIOPSY Right 05/28/2015   INVASIVE MAMMARY CARCINOMA    BREAST BIOPSY Right 06/12/2015   Wide excision, sentinel lymph node biopsy.   BREAST LUMPECTOMY WITH AXILLARY LYMPH NODE DISSECTION Right 06/12/2015   4 mm, T1a,N0 withDCIS with clear margins. ER: 90%, PR 0%; Her 2 neu not overexpressed.    CATARACT EXTRACTION W/PHACO Left 02/03/2017   Procedure: CATARACT EXTRACTION PHACO AND INTRAOCULAR LENS PLACEMENT (IOC);  Surgeon: Galen Manila, MD;  Location: [facility];  Service: Ophthalmology;  Laterality: Left;  [place]  00:28 AP% 16.3 CDE 4.68 Fluid pack lot # [id number]   CATARACT EXTRACTION W/PHACO Right 03/01/2019   Procedure: CATARACT EXTRACTION PHACO AND INTRAOCULAR LENS PLACEMENT (IOC)  RIGHT;  Surgeon: Galen Manila, MD;  Location: [facility];  Service: Ophthalmology;  Laterality: Right;   COLONOSCOPY  COLONOSCOPY WITH PROPOFOL N/A 06/27/2022   Procedure: COLONOSCOPY WITH PROPOFOL;  Surgeon: Midge Minium, MD;  Location: [facility];  Service: Endoscopy;  Laterality: N/A;   ESOPHAGOGASTRODUODENOSCOPY (EGD) WITH PROPOFOL N/A 06/27/2022   Procedure: ESOPHAGOGASTRODUODENOSCOPY (EGD) WITH PROPOFOL;  Surgeon: Midge Minium, MD;  Location: [facility];  Service: Endoscopy;  Laterality: N/A;   PARTIAL COLECTOMY N/A 07/29/2022   Procedure: PARTIAL COLECTOMY, open left, RNFA to assist;  Surgeon: Leafy Ro, MD;  Location: [facility];  Service: General;  Laterality: N/A;    SOCIAL HISTORY: Social History   Socioeconomic History   Marital status: Married    Spouse name: ,Delton See   Number of children: 3   Years  of education: GED   Highest education level: 12th grade  Occupational History   Occupation: Retired  Tobacco Use   Smoking status: Never    Passive exposure: Never   Smokeless tobacco: Never   Tobacco comments:    smoking cessation materials not required  Vaping Use   Vaping status: Never Used  Substance and Sexual Activity   Alcohol use: No   Drug use: No   Sexual activity: Not Currently  Other Topics Concern   Not on file  Social History Narrative   Not on file   Social Drivers of Health   Financial Resource Strain: Low Risk  (01/09/2023)   Received from [facility], [facility]   Overall Financial Resource Strain (CARDIA)    Difficulty of Paying Living Expenses: Not very hard  Food Insecurity: Unknown (01/09/2023)   Received from [facility], [facility]   Hunger Vital Sign    Worried About Running Out of Food in the Last Year: Never true    Ran Out of Food in the Last Year: Not on file  Transportation Needs: Unknown (01/09/2023)   Received from [facility], [facility]   [facility] - Transportation    In the past 12 months, has lack of transportation kept you from medical appointments or from getting medications?: No    Lack of Transportation (Non-Medical): Not on file  Physical Activity: Inactive (09/25/2021)   Exercise Vital Sign    Days of Exercise per Week: 0 days    Minutes of Exercise per Session: 0 min  Stress: No Stress Concern Present (09/25/2021)   [employer] of Occupational Health - Occupational Stress Questionnaire    Feeling of Stress : Not at all  Social Connections: Moderately Integrated (09/25/2021)   Social Connection and Isolation Panel [NHANES]    Frequency of Communication with Friends and Family: More than three times a week    Frequency of Social Gatherings with Friends and Family: More than three times a week    Attends Religious Services:  More than 4 times per year    Active Member of [employer] or Organizations: No    Attends Banker Meetings: Never    Marital Status: Married  Catering manager Violence: Not At Risk (08/04/2022)   Humiliation, Afraid, Rape, and Kick questionnaire    Fear of Current or Ex-Partner: No    Emotionally Abused: No    Physically Abused: No    Sexually Abused: No    FAMILY HISTORY: Family History  Problem Relation Age of Onset   Mental illness Mother        Dementia   Stroke Father    Lung cancer Brother    Breast cancer Other  dx under 50   Brain cancer Granddaughter 12       glioblastoma    ALLERGIES:  is allergic to [place] [brimonidine tartrate-timolol], lisinopril, and other.  MEDICATIONS:  Current Outpatient Medications  Medication Sig Dispense Refill   acetaminophen (TYLENOL) 500 MG tablet Take 500 mg by mouth every 6 (six) hours as needed for pain.     latanoprost (XALATAN) 0.005 % ophthalmic solution 1 drop at bedtime. Eye doctor     losartan-hydrochlorothiazide (HYZAAR) 100-12.5 MG tablet Take 1 tablet by mouth daily. 90 tablet 1   methocarbamol (ROBAXIN) 500 MG tablet Take 1 tablet (500 mg total) by mouth every 8 (eight) hours as needed for muscle spasms. 15 tablet 0   pantoprazole (PROTONIX) 40 MG tablet Take 1 tablet (40 mg total) by mouth 2 (two) times daily before a meal. 60 tablet 2   promethazine-dextromethorphan (PROMETHAZINE-DM) 6.25-15 MG/5ML syrup Take 2.5 mLs by mouth 4 (four) times daily as needed. (Patient not taking: Reported on 07/20/2023) 118 mL 0   No current facility-administered medications for this visit.    Review of Systems  Constitutional:  Positive for fatigue. Negative for appetite change, chills and fever.  HENT:   Negative for hearing loss and voice change.   Eyes:  Negative for eye problems.  Respiratory:  Negative for chest tightness and cough.   Cardiovascular:  Negative for chest pain.  Gastrointestinal:  Negative for  abdominal distention, abdominal pain and blood in stool.       Dark stool  Endocrine: Negative for hot flashes.  Genitourinary:  Negative for difficulty urinating and frequency.   Musculoskeletal:  Negative for arthralgias.  Skin:  Negative for itching and rash.  Neurological:  Negative for extremity weakness.  Hematological:  Negative for adenopathy.  Psychiatric/Behavioral:  Negative for confusion.      PHYSICAL EXAMINATION: ECOG PERFORMANCE STATUS: 1 - Symptomatic but completely ambulatory  Vitals:   07/20/23 1040 07/20/23 1054  BP: (!) 164/73 (!) 156/89  Pulse: 88   Resp: 18   Temp: 98.1 F (36.7 C)   SpO2: 98%    Filed Weights   07/20/23 1040  Weight: 136 lb 11.2 oz (62 kg)    Physical Exam Constitutional:      General: She is not in acute distress.    Appearance: She is not diaphoretic.  HENT:     Head: Normocephalic and atraumatic.  Eyes:     General: No scleral icterus. Cardiovascular:     Rate and Rhythm: Normal rate.  Pulmonary:     Effort: Pulmonary effort is normal. No respiratory distress.  Abdominal:     General: There is no distension.  Musculoskeletal:        General: Normal range of motion.     Cervical back: Normal range of motion.  Skin:    Findings: No erythema or rash.  Neurological:     Mental Status: She is alert and oriented to person, place, and time. Mental status is at baseline.     Cranial Nerves: No cranial nerve deficit.     Motor: No abnormal muscle tone.     Coordination: Coordination normal.  Psychiatric:        Mood and Affect: Mood and affect normal.      LABORATORY DATA:  I have reviewed the data as listed    Latest Ref Rng & Units 07/10/2023    8:01 AM 01/09/2023   11:10 AM 12/11/2022    1:40 PM  CBC  WBC 4.0 -  10.5 K/uL 8.1  8.8  9.9   Hemoglobin 12.0 - 15.0 g/dL 16.1  09.6  04.5   Hematocrit 36.0 - 46.0 % 41.9  37.0  38.2   Platelets 150 - 400 K/uL 314  330  299       Latest Ref Rng & Units 07/10/2023     8:01 AM 01/09/2023   11:10 AM 12/11/2022    1:40 PM  CMP  Glucose 70 - 99 mg/dL 409  811  914   BUN 8 - 23 mg/dL 18  20  18    Creatinine 0.44 - 1.00 mg/dL 7.82  9.56  2.13   Sodium 135 - 145 mmol/L 137  139  139   Potassium 3.5 - 5.1 mmol/L 3.6  3.8  3.7   Chloride 98 - 111 mmol/L 100  103  104   CO2 22 - 32 mmol/L 26  27  27    Calcium 8.9 - 10.3 mg/dL 9.4  9.1  8.8   Total Protein 6.5 - 8.1 g/dL 6.8  6.7  6.7   Total Bilirubin <1.2 mg/dL 0.5  0.7  0.4   Alkaline Phos 38 - 126 U/L 60  61  69   AST 15 - 41 U/L 18  23  24    ALT 0 - 44 U/L 13  18  15       RADIOGRAPHIC STUDIES: I have personally reviewed the radiological images as listed and agreed with the findings in the report. CT CHEST ABDOMEN PELVIS W CONTRAST Result Date: 07/16/2023 CLINICAL DATA:  Colon cancer restaging prior resection. * Tracking Code: BO * EXAM: CT CHEST, ABDOMEN, AND PELVIS WITH CONTRAST TECHNIQUE: Multidetector CT imaging of the chest, abdomen and pelvis was performed following the standard protocol during bolus administration of intravenous contrast. RADIATION DOSE REDUCTION: This exam was performed according to the departmental dose-optimization program which includes automated exposure control, adjustment of the mA and/or kV according to patient size and/or use of iterative reconstruction technique. CONTRAST:  80mL OMNIPAQUE IOHEXOL 300 MG/ML  SOLN COMPARISON:  01/12/2023 and older. FINDINGS: CT CHEST FINDINGS Cardiovascular: Heart is nonenlarged. No pericardial effusion. The thoracic aorta has a normal course and caliber with scattered vascular calcifications. There is bovine arch as well as a aberrant right subclavian artery, normal variant. Mediastinum/Nodes: Large hiatal hernia. This appears to be a mixed type. More proximal the esophagus is mildly patulous. Small thyroid gland. No specific abnormal lymph node enlargement identified in the axillary region, hilum or mediastinum. Lungs/Pleura: Bilateral apical  pleural thickening. There is scattered areas of interstitial septal thickening with scarring and fibrotic changes. Some bronchiectasis along the lower lobes medially bilaterally. No consolidation, pneumothorax or effusion. Basilar scarring and atelectatic changes. The previous nodule identified measuring 3 mm in the left upper lobe abutting the interlobar fissure is stable today on series 3, image 34. No dominant lung nodule. Musculoskeletal: Osteopenia with scattered degenerative changes. Once again there is severe compression of T12 with kyphosis and canal encroachment related to displaced posterior aspect of the vertebral body towards the canal. Since the prior there is a new severe compression of T6 with only slight kyphosis. There is also some canal encroachment along the level of the inferior endplate of T6 related to displacement of the endplate. Please correlate with clinical history and time course of injury. Additional workup as clinically appropriate. CT ABDOMEN PELVIS FINDINGS Hepatobiliary: Stable dome hepatic cystic lesion. Is also similar focus identified along segment 3 near the margin of the falciform ligament. Again  stable. No new space-occupying liver lesion. The gallbladder is nondilated. Patent portal vein. Pancreas: Unremarkable. No pancreatic ductal dilatation or surrounding inflammatory changes. Spleen: Normal in size without focal abnormality. Adrenals/Urinary Tract: Adrenal glands are preserved. Mild atrophy of the kidneys. Nonobstructing lower pole right-sided 3 mm renal stone. Ureters have normal course and caliber extending down to the urinary bladder. Parapelvic upper pole left-sided renal cysts are again seen. Preserved contours of the urinary bladder which is contracted. Stomach/Bowel: Oral contrast was administered. Again large hiatal hernia, mixed type. Stomach and small bowel are nondilated. There is some continued mild fold thickening along some loops of small bowel in the left  midabdomen but decreasing from previous. The adjacent stranding is also slightly improved. Surgical changes identified with the distal colonic resection. Rectal stump identified. Diverting colostomy. No abnormal soft tissue along the margin of the resection site. Vascular/Lymphatic: Aortic atherosclerosis. No enlarged abdominal or pelvic lymph nodes. Reproductive: Status post hysterectomy. No adnexal masses. Other: No free air or free fluid. Musculoskeletal: Osteopenia and degenerative changes. Multilevel disc bulging identified including with air at L5-S1. Some stenosis. Thin anterior abdominal wall musculature. IMPRESSION: Stable surgical changes with the distal colonic resection, diverting colostomy and rectal stump. No developing new mass lesion, fluid collection or lymph node enlargement. Development of new severe compression of the T6 vertebral body with minimal kyphosis and canal encroachment. Please correlate for known history of etiology and time course of injury or additional workup can be performed when clinically appropriate. Stable severe compression of T12 with more significant kyphosis and stenosis. Large mixed type hiatal hernia. Stable tiny lung nodule. Simple attention on follow up surveillance for the patient's neoplasm. Electronically Signed   By: Karen Kays M.D.   On: 07/16/2023 10:21

## 2023-07-20 NOTE — Assessment & Plan Note (Signed)
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  Increased risk of developing colon cancer, ovarian cancer, endometrial cancer, etc.   Patient has discussed genetic testing results with genetic counselor, and she knows the recommendation of first-degree relatives to be screened. Patient has had partial hysterectomy, ? One ovary left. She will get surveillance CT scan for colon cancer.  Continue annual screening mammogram.

## 2023-07-21 ENCOUNTER — Ambulatory Visit: Payer: Medicare HMO | Admitting: Family Medicine

## 2023-07-21 ENCOUNTER — Encounter: Payer: Self-pay | Admitting: Family Medicine

## 2023-07-21 VITALS — BP 153/83 | HR 84 | Temp 98.4°F | Resp 18 | Ht 62.0 in | Wt 136.0 lb

## 2023-07-21 DIAGNOSIS — R928 Other abnormal and inconclusive findings on diagnostic imaging of breast: Secondary | ICD-10-CM

## 2023-07-21 DIAGNOSIS — R920 Mammographic microcalcification found on diagnostic imaging of breast: Secondary | ICD-10-CM

## 2023-07-21 DIAGNOSIS — I10 Essential (primary) hypertension: Secondary | ICD-10-CM | POA: Diagnosis not present

## 2023-07-21 DIAGNOSIS — M8008XS Age-related osteoporosis with current pathological fracture, vertebra(e), sequela: Secondary | ICD-10-CM | POA: Insufficient documentation

## 2023-07-21 DIAGNOSIS — C50511 Malignant neoplasm of lower-outer quadrant of right female breast: Secondary | ICD-10-CM | POA: Diagnosis not present

## 2023-07-21 DIAGNOSIS — C186 Malignant neoplasm of descending colon: Secondary | ICD-10-CM | POA: Diagnosis not present

## 2023-07-21 MED ORDER — ALENDRONATE SODIUM 70 MG PO TABS: 70.0000 mg | ORAL_TABLET | ORAL | 5 refills | Status: DC

## 2023-07-21 MED ORDER — LOSARTAN POTASSIUM-HCTZ 100-12.5 MG PO TABS: 1.0000 | ORAL_TABLET | Freq: Every day | ORAL | 1 refills | Status: DC

## 2023-07-21 NOTE — Assessment & Plan Note (Signed)
Has colon cancer underwent right lower quadrant resection with ostomy placement January 2024.  Do a colonoscopy with Dr. Annabell Sabal office since it has been about a year.  Followed by Dr. Cathie Hoops for oncology.  Underwent radiation treatments with Dr. Aggie Cosier.

## 2023-07-21 NOTE — Assessment & Plan Note (Signed)
 Has hypertension and is treated with losartan  HCTZ.  Blood pressure is very elevated in the office on 2 checks.  Reports that her blood pressure is elevated when she goes to oncology but if she sits there long enough her blood pressure drops into the 120s systolic.  Will let her sit for a little while and then recheck her blood pressure.  Contemplating adding Norvasc  5 mg.

## 2023-07-21 NOTE — Assessment & Plan Note (Signed)
She has a compression fracture of T12.  She needs to start on a bisphosphonate.  She seems remember taking 1 of these years ago.  Advise she needs calcium and vitamin D as well.  Will check her vitamin D level.

## 2023-07-21 NOTE — Progress Notes (Signed)
 New Patient Office Visit  Subjective    Patient ID: Kristin Miller, female    DOB: 09-28-1935  Age: 87 y.o. MRN: [id number]  CC:  Chief Complaint  Patient presents with   Medical Management of Chronic Issues    HPI Kristin Miller presents to establish care She was a interior and spatial designer.  She has colon cancer, osteoporosis with T12 compression fracture, breast cancer, hypertension, glaucoma, GERD.  Outpatient Encounter Medications as of 07/21/2023  Medication Sig   acetaminophen  (TYLENOL ) 500 MG tablet Take 500 mg by mouth every 6 (six) hours as needed for pain.   latanoprost  (XALATAN ) 0.005 % ophthalmic solution 1 drop at bedtime. Eye doctor   losartan -hydrochlorothiazide  (HYZAAR) 100-12.5 MG tablet Take 1 tablet by mouth daily.   methocarbamol  (ROBAXIN ) 500 MG tablet Take 1 tablet (500 mg total) by mouth every 8 (eight) hours as needed for muscle spasms. (Patient not taking: Reported on 07/21/2023)   pantoprazole  (PROTONIX ) 40 MG tablet Take 1 tablet (40 mg total) by mouth 2 (two) times daily before a meal. (Patient not taking: Reported on 07/21/2023)   promethazine -dextromethorphan (PROMETHAZINE -DM) 6.25-15 MG/5ML syrup Take 2.5 mLs by mouth 4 (four) times daily as needed. (Patient not taking: Reported on 07/20/2023)   No facility-administered encounter medications on file as of 07/21/2023.    Past Medical History:  Diagnosis Date   Allergy    Anemia    H/O AS A CHILD   Arthritis    Back pain    Breast cancer of upper-outer quadrant of right female breast (HCC) 06/12/2015   4 millimeter, T1a, N0; ER 90%, PR 0%, HER-2/neu not overexpressed.  Declined radiation therapy.   Collar bone fracture 05/28/2013   Fractured Collar bone on left   Dyspnea    Dysrhythmia    h/o heart skipping beat years ago   Frequent headaches    h/o migraines   Hypertension    Osteopenia 04/2018   Bone density without significant interval change since 2017.   Pneumonia     Past Surgical History:   Procedure Laterality Date   ABDOMINAL HYSTERECTOMY  1970   Partial   BREAST BIOPSY Right 05/28/2015   INVASIVE MAMMARY CARCINOMA    BREAST BIOPSY Right 06/12/2015   Wide excision, sentinel lymph node biopsy.   BREAST LUMPECTOMY WITH AXILLARY LYMPH NODE DISSECTION Right 06/12/2015   4 mm, T1a,N0 withDCIS with clear margins. ER: 90%, PR 0%; Her 2 neu not overexpressed.    CATARACT EXTRACTION W/PHACO Left 02/03/2017   Procedure: CATARACT EXTRACTION PHACO AND INTRAOCULAR LENS PLACEMENT (IOC);  Surgeon: Jaye Fallow, MD;  Location: [facility];  Service: Ophthalmology;  Laterality: Left;  US   00:28 AP% 16.3 CDE 4.68 Fluid pack lot # [id number]   CATARACT EXTRACTION W/PHACO Right 03/01/2019   Procedure: CATARACT EXTRACTION PHACO AND INTRAOCULAR LENS PLACEMENT (IOC)  RIGHT;  Surgeon: Jaye Fallow, MD;  Location: [facility];  Service: Ophthalmology;  Laterality: Right;   COLONOSCOPY     COLONOSCOPY WITH PROPOFOL  N/A 06/27/2022   Procedure: COLONOSCOPY WITH PROPOFOL ;  Surgeon: Jinny Carmine, MD;  Location: [facility];  Service: Endoscopy;  Laterality: N/A;   ESOPHAGOGASTRODUODENOSCOPY (EGD) WITH PROPOFOL  N/A 06/27/2022   Procedure: ESOPHAGOGASTRODUODENOSCOPY (EGD) WITH PROPOFOL ;  Surgeon: Jinny Carmine, MD;  Location: [facility];  Service: Endoscopy;  Laterality: N/A;   PARTIAL COLECTOMY N/A 07/29/2022   Procedure: PARTIAL COLECTOMY, open left, RNFA to assist;  Surgeon: Jordis Laneta FALCON, MD;  Location: [facility];  Service: General;  Laterality: N/A;  Family History  Problem Relation Age of Onset   Mental illness Mother        Dementia   Stroke Father    Lung cancer Brother    Breast cancer Other        dx under 78   Brain cancer Granddaughter 11       glioblastoma    Social History   Socioeconomic History   Marital status: Married    Spouse name: ,Maranda   Number of children: 3   Years of education: GED   Highest education level: 12th grade  Occupational  History   Occupation: Retired  Tobacco Use   Smoking status: Never    Passive exposure: Never   Smokeless tobacco: Never   Tobacco comments:    smoking cessation materials not required  Vaping Use   Vaping status: Never Used  Substance and Sexual Activity   Alcohol use: No   Drug use: No   Sexual activity: Not Currently  Other Topics Concern   Not on file  Social History Narrative   Not on file   Social Drivers of Health   Financial Resource Strain: Low Risk  (01/09/2023)   Received from [facility], [facility]   Overall Financial Resource Strain (CARDIA)    Difficulty of Paying Living Expenses: Not very hard  Food Insecurity: Unknown (01/09/2023)   Received from [facility], [facility]   Hunger Vital Sign    Worried About Running Out of Food in the Last Year: Never true    Ran Out of Food in the Last Year: Not on file  Transportation Needs: Unknown (01/09/2023)   Received from [facility], [facility]   [facility] - Transportation    In the past 12 months, has lack of transportation kept you from medical appointments or from getting medications?: No    Lack of Transportation (Non-Medical): Not on file  Physical Activity: Inactive (09/25/2021)   Exercise Vital Sign    Days of Exercise per Week: 0 days    Minutes of Exercise per Session: 0 min  Stress: No Stress Concern Present (09/25/2021)   [employer] of Occupational Health - Occupational Stress Questionnaire    Feeling of Stress : Not at all  Social Connections: Moderately Integrated (09/25/2021)   Social Connection and Isolation Panel [NHANES]    Frequency of Communication with Friends and Family: More than three times a week    Frequency of Social Gatherings with Friends and Family: More than three times a week    Attends Religious Services: More than 4 times per year    Active Member of [employer] or Organizations:  No    Attends Banker Meetings: Never    Marital Status: Married  Catering Manager Violence: Not At Risk (08/04/2022)   Humiliation, Afraid, Rape, and Kick questionnaire    Fear of Current or Ex-Partner: No    Emotionally Abused: No    Physically Abused: No    Sexually Abused: No    ROS      Objective    BP (!) 160/91 (BP Location: Left Arm, Patient Position: Sitting, Cuff Size: Normal)   Pulse 84   Temp 98.4 F (36.9 C) (Oral)   Resp 18   Ht 5' 2 (1.575 m)   Wt 136 lb (61.7 kg)   SpO2 97%   BMI 24.87 kg/m   Physical Exam Vitals and nursing note reviewed.  Constitutional:  Appearance: Normal appearance.  HENT:     Head: Normocephalic and atraumatic.  Eyes:     Conjunctiva/sclera: Conjunctivae normal.  Cardiovascular:     Rate and Rhythm: Normal rate and regular rhythm.     Heart sounds: Murmur heard.  Pulmonary:     Effort: Pulmonary effort is normal.     Breath sounds: Normal breath sounds.  Musculoskeletal:     Right lower leg: No edema.     Left lower leg: No edema.  Skin:    General: Skin is warm and dry.  Neurological:     Mental Status: She is alert and oriented to person, place, and time.  Psychiatric:        Mood and Affect: Mood normal.        Behavior: Behavior normal.        Thought Content: Thought content normal.        Judgment: Judgment normal.         Assessment & Plan:   Problem List Items Addressed This Visit       Cardiovascular and Mediastinum   Essential hypertension   Has hypertension and is treated with losartan  HCTZ.  Blood pressure is very elevated in the office on 2 checks.  Reports that her blood pressure is elevated when she goes to oncology but if she sits there long enough her blood pressure drops into the 120s systolic.  Will let her sit for a little while and then recheck her blood pressure.  Contemplating adding Norvasc  5 mg.        Digestive   Malignant neoplasm of colon [facility]) (Chronic)   Has  colon cancer underwent right lower quadrant resection with ostomy placement January 2024.  Do a colonoscopy with Dr. Felicitas office since it has been about a year.  Followed by Dr. Babara for oncology.  Underwent radiation treatments with Dr. Camelia.        Musculoskeletal and Integument   Age-related osteoporosis with current pathol fracture of vertebra, sequela   She has a compression fracture of T12.  She needs to start on a bisphosphonate.  She seems remember taking 1 of these years ago.  Advise she needs calcium and vitamin D  as well.  Will check her vitamin D  level.        Other   Primary cancer of lower-outer quadrant of right breast (HCC) (Chronic)   Has breast cancer is followed by Dr. Dessa.  Not currently on an estrogen or progesterone blocker      Mammographic microcalcification - Primary   Microcalcifications of the breast   Abnormal finding on mammography    No follow-ups on file.   Lamontae Ricardo K Shaniqua Guillot, MD

## 2023-07-21 NOTE — Assessment & Plan Note (Signed)
Has breast cancer is followed by Dr. Lemar Livings.  Not currently on an estrogen or progesterone blocker

## 2023-07-22 DIAGNOSIS — C439 Malignant melanoma of skin, unspecified: Secondary | ICD-10-CM

## 2023-07-22 HISTORY — DX: Malignant melanoma of skin, unspecified: C43.9

## 2023-07-23 ENCOUNTER — Telehealth: Payer: Self-pay

## 2023-07-23 ENCOUNTER — Encounter: Payer: Self-pay | Admitting: Oncology

## 2023-07-23 DIAGNOSIS — Z1231 Encounter for screening mammogram for malignant neoplasm of breast: Secondary | ICD-10-CM

## 2023-07-23 NOTE — Telephone Encounter (Signed)
-----   Message from Rickard Patience sent at 07/20/2023  4:52 PM EST ----- Please let patient know that I also recommend bilateral screening mammogram to be done. Please obtain. She is due.   zy

## 2023-07-23 NOTE — Telephone Encounter (Signed)
 Spoke to pt and informed her of overdue mammogram. Order entered and [facility] should reach out to pt. Also provided pt with [facility] ph number in case she doesn't hear from them. Also set up appt for [facility] lab draw in Feb.   Please schedule lab ([facility]) on 2/21 @ 1:30. Pt aware of appt.

## 2023-07-28 ENCOUNTER — Ambulatory Visit (INDEPENDENT_AMBULATORY_CARE_PROVIDER_SITE_OTHER): Payer: Medicare HMO | Admitting: Family Medicine

## 2023-07-28 ENCOUNTER — Encounter: Payer: Self-pay | Admitting: Family Medicine

## 2023-07-28 ENCOUNTER — Ambulatory Visit: Admission: RE | Admit: 2023-07-28 | Payer: Medicare HMO | Source: Ambulatory Visit

## 2023-07-28 VITALS — BP 153/84 | HR 88 | Temp 97.4°F | Resp 14 | Ht 60.0 in | Wt 136.0 lb

## 2023-07-28 DIAGNOSIS — I10 Essential (primary) hypertension: Secondary | ICD-10-CM

## 2023-07-28 MED ORDER — AMLODIPINE BESYLATE 5 MG PO TABS
5.0000 mg | ORAL_TABLET | Freq: Every day | ORAL | 1 refills | Status: DC
Start: 2023-07-28 — End: 2024-03-07

## 2023-07-28 NOTE — Assessment & Plan Note (Signed)
 Mildly elevated blood pressures at home and in the clinic.  Good correlation with her home blood pressure monitor.  Currently taking losartan potassium 100-12.5.  We will add amlodipine 5 mg today.  Follow-up in a month to recheck blood pressure

## 2023-07-28 NOTE — Progress Notes (Signed)
   Established Patient Office Visit  Subjective   Patient ID: Kristin Miller, female    DOB: [birth year]  Age: 88 y.o. MRN: [id number]  Chief Complaint  Patient presents with   Medical Management of Chronic Issues    HPI she has been doing pretty well.  Has no complaints. Hypertension not well-controlled.  Blood pressure here 164/97 her home blood pressure cuff read 160/87.  She brought in a list of her blood pressure readings from home and most of them are greater than 150/70.  Scanned this into her chart.     ROS    Objective:     BP (!) 153/84 (BP Location: Left Arm, Patient Position: Sitting, Cuff Size: Normal)   Pulse 88   Temp (!) 97.4 F (36.3 C) (Oral)   Resp 14   Ht 5' (1.524 m) Comment: per patient  Wt 136 lb (61.7 kg)   SpO2 97%   BMI 26.56 kg/m    Physical Exam Vitals and nursing note reviewed.  Constitutional:      Appearance: Normal appearance.  HENT:     Head: Normocephalic and atraumatic.  Eyes:     Conjunctiva/sclera: Conjunctivae normal.  Cardiovascular:     Rate and Rhythm: Normal rate and regular rhythm.  Pulmonary:     Effort: Pulmonary effort is normal.     Breath sounds: Normal breath sounds.  Musculoskeletal:     Right lower leg: No edema.     Left lower leg: No edema.  Skin:    General: Skin is warm and dry.  Neurological:     Mental Status: She is alert and oriented to person, place, and time.  Psychiatric:        Mood and Affect: Mood normal.        Behavior: Behavior normal.        Thought Content: Thought content normal.        Judgment: Judgment normal.          No results found for any visits on 07/28/23.    The ASCVD Risk score (Arnett DK, et al., 2019) failed to calculate for the following reasons:   The 2019 ASCVD risk score is only valid for ages 7 to 50    Assessment & Plan:  Essential hypertension Assessment & Plan: Mildly elevated blood pressures at home and in the clinic.  Good correlation with her home  blood pressure monitor.  Currently taking losartan  potassium 100-12.5.  We will add amlodipine  5 mg today.  Follow-up in a month to recheck blood pressure      Return in about 4 weeks (around 08/25/2023) for Will need CMP as well, BP recheck.    Will Heinkel K Lawan Nanez, MD

## 2023-08-03 ENCOUNTER — Encounter: Payer: Self-pay | Admitting: Oncology

## 2023-08-05 ENCOUNTER — Ambulatory Visit
Admission: RE | Admit: 2023-08-05 | Discharge: 2023-08-05 | Disposition: A | Discharge status: Home / Self Care | Payer: Medicare HMO | Source: Ambulatory Visit | Attending: Oncology | Admitting: Oncology

## 2023-08-05 DIAGNOSIS — Z1231 Encounter for screening mammogram for malignant neoplasm of breast: Secondary | ICD-10-CM | POA: Diagnosis not present

## 2023-08-10 ENCOUNTER — Encounter: Payer: Self-pay | Admitting: Surgery

## 2023-08-10 ENCOUNTER — Ambulatory Visit (INDEPENDENT_AMBULATORY_CARE_PROVIDER_SITE_OTHER): Payer: Medicare HMO | Admitting: Surgery

## 2023-08-10 VITALS — BP 149/76 | HR 80 | Temp 98.0°F | Ht 60.0 in | Wt 134.6 lb

## 2023-08-10 DIAGNOSIS — K439 Ventral hernia 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 gangrene: Secondary | ICD-10-CM | POA: Diagnosis not present

## 2023-08-10 DIAGNOSIS — C186 Malignant neoplasm of descending colon: Secondary | ICD-10-CM | POA: Diagnosis not present

## 2023-08-10 DIAGNOSIS — K9400 Colostomy complication, unspecified: Secondary | ICD-10-CM

## 2023-08-10 NOTE — Patient Instructions (Signed)
Colostomy Home Guide, Adult  A colostomy is a surgically created opening (ostomy) that brings a piece of the large intestine through an opening in the abdomen to create a stoma. The stoma allows stool (feces) and gas (flatus) to leave the body. A stoma may be a variety of shapes and sizes. An ostomy pouch or bag is used to cover the stoma and to contain the stool and gas. A colostomy may be temporary or permanent, depending on the medical reason for your surgery. After surgery, you will need to learn to care for your ostomy. This includes emptying and changing the colostomy bag as needed. There are many different types of bags or pouching options, including one-piece, two-piece, clear, fabric covered, flat or curved (convex), drainable, and closed or vented options. Your health care provider will help you select the best pouch for you. How to care for the stoma Your stoma should look pink, red, and moist, like the inside of your cheek. It should not be painful to touch, but it may bleed easily when rubbed or bumped. Soon after surgery, the stoma may be swollen, but this goes away within 6 weeks. To care for the stoma: Keep the skin around the stoma clean and dry. Use a clean, soft washcloth to gently wash the stoma and the skin around it. Clean using a circular motion, and wipe away from the stoma opening, nottoward it. Use warm water and only use cleansers, such as mild soap and stoma-safe adhesive remover, recommended by your health care provider. Inspect and dry the skin around the stoma. Use stoma powder or skin protectant on your skin as told by your health care provider. Do not use any other powders, gels, wipes, or creams on the skin around the stoma. These may keep pouch adhesive from sticking or may cause injury to your stoma. Check the stoma area every day for signs of infection or problems. Check for: New or worsening redness, warmth, swelling, irritation, itching, or pain around the  stoma. New or worsening changes to the stoma, such as bleeding, trauma, or a dark and dusky color. Changes to the drainage from the stoma, such as pus, blood, a large amount of liquid drainage, or little to no stool drainage. Measure the stoma opening regularly and record the size. Watch for changes, such as the stoma getting longer, becoming flat, or falling below skin level. (It is normal for the stoma to get smaller as the swelling goes away.) Share this information with your health care provider. Changes in the size or shape of the stoma may require a different style of pouch to be used. How to empty the colostomy bag     Empty your bag at bedtime, before physical activity or sexual relations, and whenever it is one-third to one-half full. Do not let the bag get more than half-full with stool or gas. The bag could leak if it gets too full. Some colostomy bags have a built-in gas release valve that releases gas often throughout the day. Follow these basic steps for emptying a drainable pouch: Prepare the toilet or container: If draining directly into the toilet, put several pieces of toilet paper into the toilet water. This will prevent splashing as you empty the stool into the toilet. Sit far back on the toilet seat. If draining into a container, place a few pieces of moistened toilet paper in the bottom of the container. This can help when emptying the container. Remove the clip or the hook-and-loop fastener from  the tail end of the bag. Unroll the tail, then empty the stool into the toilet or container. Clean the tail with toilet paper or a moist towelette. Reroll the tail, and close it with the clip or the hook-and-loop fastener. Wash your hands. How to change the colostomy bag Change your bag every 3-4 days or as often as told by your health care provider. Also change the bag if it is leaking or separating from the skin, or if your skin around the stoma looks or feels irritated. Irritated  skin may be a sign that the bag is leaking. Always have colostomy supplies with you, and follow these basic steps: Have paper towels or tissues and a trash bag nearby to clean any discharge and dispose of the soiled pouch. Remove the old pouching appliance. Use your fingers, a warm, moist cloth, or a stoma-safe adhesive remover to gently remove the pouch and remove adhesive residue. Clean the stoma area with water or with mild soap and water, as directed. Use water to rinse away any soap. Dry the skin. You may use the cool setting on a hair dryer to do this. Use a tracing pattern (template) to cut the skin barrier to the size needed. The opening should be large enough to fit around the stoma, but you should not see exposed skin. If you are using a two-piece bag, attach the bag and the base to each other. Add the barrier ring, if you use one. If directed, apply stoma powder or skin protectant to the skin. Warm the pouch base with your hands or blow with a hair dryer for 5-10 seconds. Remove the paper from the adhesive backing of the pouch base. Press the adhesive backing onto the skin around the stoma. Gently rub the pouch base onto the skin. This creates heat that helps the barrier to stick. Apply barrier strips to the edges of the pouch, if desired. Dispose of the soiled pouch, and wash your hands. General recommendations Avoid wearing tight clothes or having anything press directly on your stoma or bag. Change your clothing whenever it is soiled or damp. You may shower or bathe with the bag on or off. Do not use harsh or oily soaps or lotions. Dry the skin and bag after bathing. Do not shower or bathe right after changing the pouch. Wait 4-5 hours. Store all supplies in a cool, dry place. Do not leave supplies in extreme heat because some parts can melt or not stick as well. Whenever you leave home, take extra clothing and an extra ostomy pouch with you. If your bag gets wet, you can dry it  with a hair dryer on the cool setting. To prevent odor, you may put drops of ostomy deodorizer in the bag. If recommended by your health care provider, put ostomy lubricant inside the bag. This helps stool slide out of the bag more easily and completely. Contact a health care provider if: You have new or worsening redness, warmth, swelling, irritation, itching, or pain around the stoma. You have new or worsening changes to the stoma, such as bleeding, trauma, or a dark and dusky color. There are changes to the drainage from the stoma, such as pus, blood, a large amount of liquid drainage, or little to no stool drainage. Your stoma extends in or out farther than normal. You need to change your bag every day or have frequent leaks. You have a fever. Get help right away if: Your stool is bloody. You have nausea or  you vomit. You have trouble breathing. These symptoms may be an emergency. Get help right away. Call 911. Do not wait to see if the symptoms will go away. Do not drive yourself to the hospital. Summary Measure your stoma opening regularly and record the size. Watch for changes. Empty your bag at bedtime, before physical activity or sexual relations, and whenever it is one-third to one-half full. Do not let the bag get more than half-full with stool or gas. Change your bag every 3-4 days or as often as told by your health care provider. Whenever you leave home, take extra clothing and an extra ostomy pouch with you. This information is not intended to replace advice given to you by your health care provider. Make sure you discuss any questions you have with your health care provider. Document Revised: 07/17/2021 Document Reviewed: 07/17/2021 Elsevier Patient Education  2024 [employer].

## 2023-08-11 NOTE — Progress Notes (Signed)
Outpatient Surgical Follow Up    Kristin Miller is an 88 y.o. female.   Chief Complaint  Patient presents with   Follow-up    Colectomy 07/29/2022    HPI: 88 year old female status post sigmoid colectomy and pelvic wall resection for colon cancer  1 year ago.t4bn0 , dMMR [loss of PMS2] -  Negative margins.  She completed 5 weeks of adjuvant XRT, She is doing well considering , she has gained weight.  She is tolerating diet she is ambulating and her colostomy is working well.  No fevers no chills.  She declined adjuvant chemo.  Shehas noticed a left flank bulge.  No pain. Eating well and her colostomy is working well. She Did have a recent CT that I personally reviewed showing no evidence of metastatic disease.  A left flank hernia without evidence of complications some loss of domain  Past Medical History:  Diagnosis Date   Allergy    Anemia    H/O AS A CHILD   Arthritis    Back pain    Breast cancer of upper-outer quadrant of right female breast (HCC) 06/12/2015   4 millimeter, T1a, N0; ER 90%, PR 0%, HER-2/neu not overexpressed.  Declined radiation therapy.   Collar bone fracture 05/28/2013   Fractured Collar bone on left   Dyspnea    Dysrhythmia    h/o heart skipping beat years ago   Frequent headaches    h/o migraines   Hypertension    Osteopenia 04/2018   Bone density without significant interval change since 2017.   Pneumonia     Past Surgical History:  Procedure Laterality Date   ABDOMINAL HYSTERECTOMY  1970   Partial   BREAST BIOPSY Right 05/28/2015   INVASIVE MAMMARY CARCINOMA    BREAST BIOPSY Right 06/12/2015   Wide excision, sentinel lymph node biopsy.   BREAST EXCISIONAL BIOPSY Right    BREAST LUMPECTOMY WITH AXILLARY LYMPH NODE DISSECTION Right 06/12/2015   4 mm, T1a,N0 withDCIS with clear margins. ER: 90%, PR 0%; Her 2 neu not overexpressed.    CATARACT EXTRACTION W/PHACO Left 02/03/2017   Procedure: CATARACT EXTRACTION PHACO AND INTRAOCULAR LENS  PLACEMENT (IOC);  Surgeon: Galen Manila, MD;  Location: [facility];  Service: Ophthalmology;  Laterality: Left;  [place]  00:28 AP% 16.3 CDE 4.68 Fluid pack lot # [id number]   CATARACT EXTRACTION W/PHACO Right 03/01/2019   Procedure: CATARACT EXTRACTION PHACO AND INTRAOCULAR LENS PLACEMENT (IOC)  RIGHT;  Surgeon: Galen Manila, MD;  Location: [facility];  Service: Ophthalmology;  Laterality: Right;   COLONOSCOPY     COLONOSCOPY WITH PROPOFOL N/A 06/27/2022   Procedure: COLONOSCOPY WITH PROPOFOL;  Surgeon: Midge Minium, MD;  Location: [facility];  Service: Endoscopy;  Laterality: N/A;   ESOPHAGOGASTRODUODENOSCOPY (EGD) WITH PROPOFOL N/A 06/27/2022   Procedure: ESOPHAGOGASTRODUODENOSCOPY (EGD) WITH PROPOFOL;  Surgeon: Midge Minium, MD;  Location: [facility];  Service: Endoscopy;  Laterality: N/A;   PARTIAL COLECTOMY N/A 07/29/2022   Procedure: PARTIAL COLECTOMY, open left, RNFA to assist;  Surgeon: Leafy Ro, MD;  Location: [facility];  Service: General;  Laterality: N/A;    Family History  Problem Relation Age of Onset   Mental illness Mother        Dementia   Stroke Father    Lung cancer Brother    Breast cancer Other        dx under 37   Brain cancer Granddaughter 12       glioblastoma    Social History:  reports that she has never smoked. She has never been exposed to tobacco smoke. She has never used smokeless tobacco. She reports that she does not drink alcohol and does not use drugs.  Allergies:  Allergies  Allergen Reactions   Combigan [Brimonidine Tartrate-Timolol]     Redness, burning, irritation    Lisinopril Cough   Other Rash    SUTURES-(PARTIAL HYSTERECTOMY)    Medications reviewed.    ROS Full ROS performed and is otherwise negative other than what is stated in HPI   BP (!) 149/76   Pulse 80   Temp 98 F (36.7 C) (Oral)   Ht 5' (1.524 m)   Wt 134 lb 9.6 oz (61.1 kg)   SpO2 96%   BMI 26.29 kg/m   Physical Exam Vitals  and nursing note reviewed. Exam conducted with a chaperone present.  Constitutional:      General: She is not in acute distress.    Appearance: Normal appearance. She is normal weight.  Cardiovascular:     Rate and Rhythm: Normal rate and regular rhythm.     Heart sounds: No murmur heard.    No friction rub.  Pulmonary:     Effort: Pulmonary effort is normal. No respiratory distress.     Breath sounds: Normal breath sounds. No stridor. No wheezing or rhonchi.  Abdominal:     General: Abdomen is flat. There is no distension.     Palpations: Abdomen is soft. There is no mass.     Tenderness: There is no abdominal tenderness.     Hernia: No hernia is present.     Comments: Ostomy in place working and widely patent.  No infection.  Midline scar healed . There is a good size left flank hernia defect w some loss of domain Musculoskeletal:        General: Normal range of motion.     Cervical back: Normal range of motion and neck supple. No rigidity or tenderness.  Skin:    General: Skin is warm and dry.     Capillary Refill: Capillary refill takes less than 2 seconds.  Neurological:     General: No focal deficit present.     Mental Status: She is alert and oriented to person, place, and time.  Psychiatric:        Mood and Affect: Mood normal.        Behavior: Behavior normal.        Thought Content: Thought content normal.        Judgment: Judgment normal.      Assessment/Plan: 88 year old female with history of colon cancer T4 invading into the musculature status post radical resection with some abdominal wall resection followed by adjuvant radiation therapy.  Not surprisingly she has developed a flank hernia likely a combination of surgery and radiation therapy. We had another  Extensive discussion with the patient regarding her disease process.  This is a good sized hernia at that will likely require an open approach with abd wall reconstruction and potential mesh.  This in the setting  of the colostomy make things very complicated.  She has made good progress during the last few months.  She wishes to explore a second opinion regarding ostomy takedown and abd wall reconstruction with ventral hernia repair. We will refer to Dr Royce Macadamia who is abd wall reconstruction expert. No need for urgent intervention at this time  Please note that I spent 40 minutes in this encounter including personally reviewing imaging studies, coordinating her  care, placing orders, counseling the patient and performing documentation    Sterling Big, MD [facility] General Surgeon

## 2023-08-25 ENCOUNTER — Encounter: Payer: Self-pay | Admitting: Family Medicine

## 2023-08-25 ENCOUNTER — Ambulatory Visit (INDEPENDENT_AMBULATORY_CARE_PROVIDER_SITE_OTHER): Payer: Medicare HMO | Admitting: Family Medicine

## 2023-08-25 VITALS — BP 156/83 | HR 85 | Temp 97.8°F | Resp 14 | Ht 60.0 in | Wt 135.0 lb

## 2023-08-25 DIAGNOSIS — I10 Essential (primary) hypertension: Secondary | ICD-10-CM | POA: Diagnosis not present

## 2023-08-25 DIAGNOSIS — K2971 Gastritis, unspecified, with bleeding: Secondary | ICD-10-CM

## 2023-08-25 NOTE — Progress Notes (Signed)
   Established Patient Office Visit  Subjective   Patient ID: Kristin Miller, female    DOB: [birth year]  Age: 88 y.o. MRN: [id number]  Chief Complaint  Patient presents with   Medical Management of Chronic Issues    HPI Pleasant 88 year old woman with history of colon cancer s/p sigmoid colectomy and pelvic wall resection with negative margins, osteoporosis with compression fracture of vertebrae, iron  deficiency anemia, breast cancer s/p (ER 90, PR 0, HER2/NEU no overexpression) did not take radiation or chemotherapy following lumpectomy. She is on Fosamax  weekly and just started this.  She understands the instructions on how to take this correctly.  Today she is worried about finding blood clots in her stool started maybe a month or so ago.  She has an appointment with GI next week.  She denies feeling weak, palpitations, rapid heartbeat, shortness of breath PND orthopnea.  She has had to take iron  infusions at the time of her diagnosis of colon cancer and following surgery and does follow with hematology. PHQ-9 is 1 GAD-7 is 0.   She declined the flu vaccine.     ROS    Objective:     BP (!) 156/83 (BP Location: Right Arm, Patient Position: Sitting, Cuff Size: Normal)   Pulse 85   Temp 97.8 F (36.6 C) (Oral)   Resp 14   Ht 5' (1.524 m) Comment: per patient  Wt 135 lb (61.2 kg)   SpO2 96%   BMI 26.37 kg/m    Physical Exam Vitals and nursing note reviewed.  Constitutional:      Appearance: Normal appearance.  HENT:     Head: Normocephalic and atraumatic.  Eyes:     Conjunctiva/sclera: Conjunctivae normal.  Cardiovascular:     Rate and Rhythm: Normal rate and regular rhythm.  Pulmonary:     Effort: Pulmonary effort is normal.     Breath sounds: Normal breath sounds.  Musculoskeletal:     Right lower leg: No edema.     Left lower leg: No edema.  Skin:    General: Skin is warm and dry.  Neurological:     Mental Status: She is alert and oriented to person,  place, and time.  Psychiatric:        Mood and Affect: Mood normal.        Behavior: Behavior normal.        Thought Content: Thought content normal.        Judgment: Judgment normal.          No results found for any visits on 08/25/23.    The ASCVD Risk score (Arnett DK, et al., 2019) failed to calculate for the following reasons:   The 2019 ASCVD risk score is only valid for ages 31 to 73    Assessment & Plan:  Primary hypertension Assessment & Plan: On amlodipine  5 mg, losartan  HCTZ 100-12.5mg   Orders: -     CMP14+EGFR -     TSH + free T4 -     CBC with Differential/Platelet  Gastrointestinal hemorrhage associated with gastritis, unspecified gastritis type Assessment & Plan: Reports that she has blood clots in her stool for the last month.  Has an appointment with GI next week.  Will check CBC, TIBC, iron  saturation and ferritin      Return in about 3 months (around 11/22/2023).    Edi Gorniak K Cabe Lashley, MD

## 2023-08-25 NOTE — Assessment & Plan Note (Signed)
She brought a list of her blood pressure readings from home and they run in the 120s to 130s rare 140 systolic.  She has whitecoat hypertension

## 2023-08-25 NOTE — Assessment & Plan Note (Signed)
On amlodipine 5 mg, losartan HCTZ 100-12.5mg 

## 2023-08-25 NOTE — Assessment & Plan Note (Signed)
Reports that she has blood clots in her stool for the last month.  Has an appointment with GI next week.  Will check CBC, TIBC, iron saturation and ferritin

## 2023-08-26 ENCOUNTER — Encounter: Payer: Self-pay | Admitting: Family Medicine

## 2023-08-26 LAB — CMP14+EGFR
ALT: 11 [IU]/L (ref 0–32)
AST: 16 [IU]/L (ref 0–40)
Albumin: 4.1 g/dL (ref 3.7–4.7)
Alkaline Phosphatase: 69 [IU]/L (ref 44–121)
BUN/Creatinine Ratio: 18 (ref 12–28)
BUN: 15 mg/dL (ref 8–27)
Bilirubin Total: 0.3 mg/dL (ref 0.0–1.2)
CO2: 24 mmol/L (ref 20–29)
Calcium: 9.1 mg/dL (ref 8.7–10.3)
Chloride: 103 mmol/L (ref 96–106)
Creatinine, Ser: 0.82 mg/dL (ref 0.57–1.00)
Globulin, Total: 2.1 g/dL (ref 1.5–4.5)
Glucose: 103 mg/dL — ABNORMAL HIGH (ref 70–99)
Potassium: 3.9 mmol/L (ref 3.5–5.2)
Sodium: 143 mmol/L (ref 134–144)
Total Protein: 6.2 g/dL (ref 6.0–8.5)
eGFR: 70 mL/min/{1.73_m2} (ref >59)

## 2023-08-26 LAB — CBC WITH DIFFERENTIAL/PLATELET
Basophils Absolute: 0.1 10*3/uL (ref 0.0–0.2)
Basos: 1 %
EOS (ABSOLUTE): 0.2 10*3/uL (ref 0.0–0.4)
Eos: 2 %
Hematocrit: 42.8 % (ref 34.0–46.6)
Hemoglobin: 13.4 g/dL (ref 11.1–15.9)
Immature Grans (Abs): 0 10*3/uL (ref 0.0–0.1)
Immature Granulocytes: 0 %
Lymphocytes Absolute: 2 10*3/uL (ref 0.7–3.1)
Lymphs: 24 %
MCH: 28.3 pg (ref 26.6–33.0)
MCHC: 31.3 g/dL — ABNORMAL LOW (ref 31.5–35.7)
MCV: 91 fL (ref 79–97)
Monocytes Absolute: 1 10*3/uL — ABNORMAL HIGH (ref 0.1–0.9)
Monocytes: 12 %
Neutrophils Absolute: 5.1 10*3/uL (ref 1.4–7.0)
Neutrophils: 61 %
Platelets: 320 10*3/uL (ref 150–450)
RBC: 4.73 x10E6/uL (ref 3.77–5.28)
RDW: 13 % (ref 11.7–15.4)
WBC: 8.3 10*3/uL (ref 3.4–10.8)

## 2023-08-26 LAB — TSH+FREE T4
Free T4: 1.09 ng/dL (ref 0.82–1.77)
TSH: 3.75 u[IU]/mL (ref 0.450–4.500)

## 2023-08-27 LAB — IRON,TIBC AND FERRITIN PANEL
Ferritin: 46 ng/mL (ref 15–150)
Iron Saturation: 34 % (ref 15–55)
Iron: 100 ug/dL (ref 27–139)
Total Iron Binding Capacity: 292 ug/dL (ref 250–450)
UIBC: 192 ug/dL (ref 118–369)

## 2023-08-27 LAB — SPECIMEN STATUS REPORT

## 2023-09-01 DIAGNOSIS — H26493 Other secondary cataract, bilateral: Secondary | ICD-10-CM | POA: Diagnosis not present

## 2023-09-01 DIAGNOSIS — H35033 Hypertensive retinopathy, bilateral: Secondary | ICD-10-CM | POA: Diagnosis not present

## 2023-09-01 DIAGNOSIS — I10 Essential (primary) hypertension: Secondary | ICD-10-CM | POA: Diagnosis not present

## 2023-09-01 DIAGNOSIS — H401134 Primary open-angle glaucoma, bilateral, indeterminate stage: Secondary | ICD-10-CM | POA: Diagnosis not present

## 2023-09-01 DIAGNOSIS — H3563 Retinal hemorrhage, bilateral: Secondary | ICD-10-CM | POA: Diagnosis not present

## 2023-09-01 NOTE — Progress Notes (Unsigned)
Celso Amy, PA-C [address]  [address]  [place], [place] [address]  Main: [phone]  Fax: [phone]   Primary Care Physician: Alease Medina, MD  Primary Gastroenterologist:  Celso Amy, PA-C / Dr. Midge Minium    CC: F/U Colon Cancer  HPI: Kristin Miller is a 88 y.o. female returns for follow-up of colon cancer (T4bN0). S/P  partial colectomy and pelvic wall resection 07/2022 by Dr. Everlene Farrier. Negative margins.  She completed 5 weeks of adjuvant XRT. She declined adjuvant chemo. Her colostomy is working well.  07/10/2023 CT chest abdomen and pelvis showed no evidence of metastatic disease.   06/2022 colonoscopy by Dr. Servando Snare: Large mass in the descending colon.  Biopsies consistent with invasive adenocarcinoma.  06/2022 EGD: Large hiatal hernia, white plaques in the esophagus, normal stomach and duodenum.  Biopsies consistent with esophageal candidiasis.  Current Outpatient Medications  Medication Sig Dispense Refill   acetaminophen (TYLENOL) 500 MG tablet Take 500 mg by mouth every 6 (six) hours as needed for pain.     alendronate (FOSAMAX) 70 MG tablet Take 1 tablet (70 mg total) by mouth every 7 (seven) days for 48 doses. Take with a full glass of water on an empty stomach. 4 tablet 5   amLODipine (NORVASC) 5 MG tablet Take 1 tablet (5 mg total) by mouth daily. 90 tablet 1   latanoprost (XALATAN) 0.005 % ophthalmic solution 1 drop at bedtime. Eye doctor     losartan-hydrochlorothiazide (HYZAAR) 100-12.5 MG tablet Take 1 tablet by mouth daily. 90 tablet 1   methocarbamol (ROBAXIN) 500 MG tablet Take 1 tablet (500 mg total) by mouth every 8 (eight) hours as needed for muscle spasms. 15 tablet 0   pantoprazole (PROTONIX) 40 MG tablet Take 1 tablet (40 mg total) by mouth 2 (two) times daily before a meal. (Patient not taking: Reported on 08/25/2023) 60 tablet 2   promethazine-dextromethorphan (PROMETHAZINE-DM) 6.25-15 MG/5ML syrup Take 2.5 mLs by mouth 4 (four) times  daily as needed. (Patient not taking: Reported on 08/25/2023) 118 mL 0   No current facility-administered medications for this visit.    Allergies as of 09/02/2023 - Review Complete 08/25/2023  Allergen Reaction Noted   Combigan [brimonidine tartrate-timolol]  01/30/2021   Lisinopril Cough 08/22/2020   Other Rash 06/06/2015    Past Medical History:  Diagnosis Date   Allergy    Anemia    H/O AS A CHILD   Arthritis    Back pain    Breast cancer of upper-outer quadrant of right female breast (HCC) 06/12/2015   4 millimeter, T1a, N0; ER 90%, PR 0%, HER-2/neu not overexpressed.  Declined radiation therapy.   Collar bone fracture 05/28/2013   Fractured Collar bone on left   Dyspnea    Dysrhythmia    h/o heart skipping beat years ago   Frequent headaches    h/o migraines   Hypertension    Osteopenia 04/2018   Bone density without significant interval change since 2017.   Pneumonia     Past Surgical History:  Procedure Laterality Date   ABDOMINAL HYSTERECTOMY  1970   Partial   BREAST BIOPSY Right 05/28/2015   INVASIVE MAMMARY CARCINOMA    BREAST BIOPSY Right 06/12/2015   Wide excision, sentinel lymph node biopsy.   BREAST EXCISIONAL BIOPSY Right    BREAST LUMPECTOMY WITH AXILLARY LYMPH NODE DISSECTION Right 06/12/2015   4 mm, T1a,N0 withDCIS with clear margins. ER: 90%, PR 0%; Her 2 neu not overexpressed.  CATARACT EXTRACTION W/PHACO Left 02/03/2017   Procedure: CATARACT EXTRACTION PHACO AND INTRAOCULAR LENS PLACEMENT (IOC);  Surgeon: Galen Manila, MD;  Location: [facility];  Service: Ophthalmology;  Laterality: Left;  [place]  00:28 AP% 16.3 CDE 4.68 Fluid pack lot # [id number]   CATARACT EXTRACTION W/PHACO Right 03/01/2019   Procedure: CATARACT EXTRACTION PHACO AND INTRAOCULAR LENS PLACEMENT (IOC)  RIGHT;  Surgeon: Galen Manila, MD;  Location: [facility];  Service: Ophthalmology;  Laterality: Right;   COLONOSCOPY     COLONOSCOPY WITH PROPOFOL N/A 06/27/2022    Procedure: COLONOSCOPY WITH PROPOFOL;  Surgeon: Midge Minium, MD;  Location: [facility];  Service: Endoscopy;  Laterality: N/A;   ESOPHAGOGASTRODUODENOSCOPY (EGD) WITH PROPOFOL N/A 06/27/2022   Procedure: ESOPHAGOGASTRODUODENOSCOPY (EGD) WITH PROPOFOL;  Surgeon: Midge Minium, MD;  Location: [facility];  Service: Endoscopy;  Laterality: N/A;   PARTIAL COLECTOMY N/A 07/29/2022   Procedure: PARTIAL COLECTOMY, open left, RNFA to assist;  Surgeon: Leafy Ro, MD;  Location: [facility];  Service: General;  Laterality: N/A;    Review of Systems:    All systems reviewed and negative except where noted in HPI.   Physical Examination:   There were no vitals taken for this visit.  General: Well-nourished, well-developed in no acute distress.  Lungs: Clear to auscultation bilaterally. Non-labored. Heart: Regular rate and rhythm, no murmurs rubs or gallops.  Abdomen: Bowel sounds are normal; Abdomen is Soft; No hepatosplenomegaly, masses or hernias;  No Abdominal Tenderness; No guarding or rebound tenderness. Neuro: Alert and oriented x 3.  Grossly intact.  Psych: Alert and cooperative, normal mood and affect.   Imaging Studies: MM 3D SCREENING MAMMOGRAM BILATERAL BREAST Result Date: 08/06/2023 CLINICAL DATA:  Screening. Personal history of malignant RIGHT breast lumpectomy in 2016. EXAM: DIGITAL SCREENING BILATERAL MAMMOGRAM WITH TOMOSYNTHESIS AND CAD TECHNIQUE: Bilateral screening digital craniocaudal and mediolateral oblique mammograms were obtained. Bilateral screening digital breast tomosynthesis was performed. The images were evaluated with computer-aided detection. COMPARISON:  Previous exam(s). ACR Breast Density Category b: There are scattered areas of fibroglandular density. FINDINGS: There are no findings suspicious for malignancy. Expected post lumpectomy changes involving the RIGHT breast. IMPRESSION: No mammographic evidence of malignancy. A result letter of this  screening mammogram will be mailed directly to the patient. RECOMMENDATION: Screening mammogram in one year. (Code:SM-B-01Y) BI-RADS CATEGORY  2: Benign. Electronically Signed   By: Hulan Saas M.D.   On: 08/06/2023 16:30    Assessment and Plan:   DARLISA SPRUIELL is a 88 y.o. y/o female ***    Celso Amy, PA-C  Follow up ***  BP check ***

## 2023-09-02 ENCOUNTER — Encounter: Payer: Self-pay | Admitting: Oncology

## 2023-09-02 ENCOUNTER — Encounter: Payer: Self-pay | Admitting: Physician Assistant

## 2023-09-02 ENCOUNTER — Ambulatory Visit: Payer: Medicare HMO | Admitting: Physician Assistant

## 2023-09-02 VITALS — BP 152/74 | HR 87 | Temp 97.7°F | Ht 65.0 in | Wt 135.6 lb

## 2023-09-02 DIAGNOSIS — B3781 Candidal esophagitis: Secondary | ICD-10-CM | POA: Diagnosis not present

## 2023-09-02 DIAGNOSIS — Z85038 Personal history of other malignant neoplasm of large intestine: Secondary | ICD-10-CM

## 2023-09-02 DIAGNOSIS — K219 Gastro-esophageal reflux disease without esophagitis: Secondary | ICD-10-CM

## 2023-09-02 MED ORDER — FLUCONAZOLE 100 MG PO TABS: 100.0000 mg | ORAL_TABLET | Freq: Every day | ORAL | 0 refills | Status: AC

## 2023-09-02 MED ORDER — NA SULFATE-K SULFATE-MG SULF 17.5-3.13-1.6 GM/177ML PO SOLN: 1.0000 | Freq: Once | ORAL | 0 refills | Status: AC

## 2023-09-11 ENCOUNTER — Inpatient Hospital Stay: Payer: Medicare HMO | Attending: Oncology

## 2023-09-11 ENCOUNTER — Other Ambulatory Visit: Payer: Self-pay

## 2023-09-11 DIAGNOSIS — C186 Malignant neoplasm of descending colon: Secondary | ICD-10-CM

## 2023-09-11 LAB — GENETIC SCREENING ORDER

## 2023-09-18 ENCOUNTER — Encounter: Payer: Self-pay | Admitting: Oncology

## 2023-09-18 LAB — SIGNATERA
SIGNATERA MTM READOUT: 0.04 MTM/ml — AB
SIGNATERA TEST RESULT: POSITIVE — AB

## 2023-09-23 ENCOUNTER — Other Ambulatory Visit: Payer: Self-pay

## 2023-09-24 ENCOUNTER — Telehealth: Payer: Self-pay

## 2023-09-24 ENCOUNTER — Other Ambulatory Visit: Payer: Self-pay

## 2023-09-24 NOTE — Telephone Encounter (Signed)
-----   Message from Rickard Patience sent at 09/22/2023 10:08 PM EST ----- Please let patient know that one of circulating tumor testing is positive. I recommend to repeat Signatera testing in 3 months and also move up her next CT to be 3 months. Please also move up her MD testing to be 1 week after CT scan. Thanks.   zhou

## 2023-09-24 NOTE — Telephone Encounter (Signed)
 Called patient and informed her that one of the circulating tumor testing is positive and that Dr. Cathie Hoops recommends repeating Signatera testing in 3 months and also move up CT to 3 months. I will have Kristin Miller schedule these and inform patient of dates and times.

## 2023-09-25 ENCOUNTER — Other Ambulatory Visit: Payer: Self-pay

## 2023-09-25 DIAGNOSIS — C186 Malignant neoplasm of descending colon: Secondary | ICD-10-CM

## 2023-10-06 ENCOUNTER — Ambulatory Visit: Admitting: Anesthesiology

## 2023-10-06 ENCOUNTER — Ambulatory Visit
Admission: RE | Admit: 2023-10-06 | Discharge: 2023-10-06 | Disposition: A | Discharge status: Home / Self Care | Payer: Medicare HMO | Attending: Gastroenterology | Admitting: Gastroenterology

## 2023-10-06 ENCOUNTER — Encounter
Admission: RE | Disposition: A | Discharge status: Home / Self Care | Payer: Self-pay | Source: Home / Self Care | Attending: Gastroenterology

## 2023-10-06 ENCOUNTER — Other Ambulatory Visit: Payer: Self-pay

## 2023-10-06 DIAGNOSIS — D490 Neoplasm of unspecified behavior of digestive system: Secondary | ICD-10-CM

## 2023-10-06 DIAGNOSIS — Z933 Colostomy status: Secondary | ICD-10-CM | POA: Diagnosis not present

## 2023-10-06 DIAGNOSIS — Z9049 Acquired absence of other specified parts of digestive tract: Secondary | ICD-10-CM | POA: Insufficient documentation

## 2023-10-06 DIAGNOSIS — Z853 Personal history of malignant neoplasm of breast: Secondary | ICD-10-CM | POA: Diagnosis not present

## 2023-10-06 DIAGNOSIS — Z85038 Personal history of other malignant neoplasm of large intestine: Secondary | ICD-10-CM | POA: Diagnosis not present

## 2023-10-06 DIAGNOSIS — K635 Polyp of colon: Secondary | ICD-10-CM

## 2023-10-06 DIAGNOSIS — Z1211 Encounter for screening for malignant neoplasm of colon: Secondary | ICD-10-CM

## 2023-10-06 DIAGNOSIS — I10 Essential (primary) hypertension: Secondary | ICD-10-CM | POA: Diagnosis not present

## 2023-10-06 DIAGNOSIS — Z08 Encounter for follow-up examination after completed treatment for malignant neoplasm: Secondary | ICD-10-CM | POA: Diagnosis not present

## 2023-10-06 DIAGNOSIS — C19 Malignant neoplasm of rectosigmoid junction: Secondary | ICD-10-CM | POA: Insufficient documentation

## 2023-10-06 DIAGNOSIS — C186 Malignant neoplasm of descending colon: Secondary | ICD-10-CM

## 2023-10-06 HISTORY — PX: POLYPECTOMY: SHX5525

## 2023-10-06 HISTORY — PX: COLONOSCOPY WITH PROPOFOL: SHX5780

## 2023-10-06 SURGERY — COLONOSCOPY WITH PROPOFOL
Anesthesia: General

## 2023-10-06 MED ORDER — LIDOCAINE HCL (CARDIAC) PF 100 MG/5ML IV SOSY
PREFILLED_SYRINGE | INTRAVENOUS | Status: DC | PRN
  Administered 2023-10-06: 50 mg via INTRAVENOUS

## 2023-10-06 MED ORDER — PROPOFOL 10 MG/ML IV BOLUS
INTRAVENOUS | Status: DC | PRN
  Administered 2023-10-06: 80 mg via INTRAVENOUS
  Administered 2023-10-06: 30 mg via INTRAVENOUS

## 2023-10-06 MED ORDER — SODIUM CHLORIDE 0.9 % IV SOLN: INTRAVENOUS | Status: DC

## 2023-10-06 NOTE — Anesthesia Preprocedure Evaluation (Signed)
 Anesthesia Evaluation  Patient identified by MRN, date of birth, ID band Patient awake    Reviewed: Allergy & Precautions, NPO status , Patient's Chart, lab work & pertinent test results  History of Anesthesia Complications Negative for: history of anesthetic complications  Airway Mallampati: III  TM Distance: <3 FB Neck ROM: full    Dental  (+) Chipped, Poor Dentition, Missing, Upper Dentures   Pulmonary neg pulmonary ROS, neg shortness of breath   Pulmonary exam normal        Cardiovascular Exercise Tolerance: Good hypertension, (-) angina Normal cardiovascular exam+ dysrhythmias      Neuro/Psych  Headaches  negative psych ROS   GI/Hepatic negative GI ROS, Neg liver ROS,,,  Endo/Other  negative endocrine ROS    Renal/GU negative Renal ROS  negative genitourinary   Musculoskeletal   Abdominal   Peds  Hematology negative hematology ROS (+)   Anesthesia Other Findings Past Medical History: No date: Allergy No date: Anemia     Comment:  H/O AS A CHILD No date: Arthritis No date: Back pain 06/12/2015: Breast cancer of upper-outer quadrant of right female  breast (HCC)     Comment:  4 millimeter, T1a, N0; ER 90%, PR 0%, HER-2/neu not               overexpressed.  Declined radiation therapy. 05/28/2013: Collar bone fracture     Comment:  Fractured Collar bone on left No date: Dyspnea No date: Dysrhythmia     Comment:  h/o heart skipping beat years ago No date: Frequent headaches     Comment:  h/o migraines No date: Hypertension 04/2018: Osteopenia     Comment:  Bone density without significant interval change since               2017. No date: Pneumonia  Past Surgical History: 1970: ABDOMINAL HYSTERECTOMY     Comment:  Partial 05/28/2015: BREAST BIOPSY; Right     Comment:  INVASIVE MAMMARY CARCINOMA  06/12/2015: BREAST BIOPSY; Right     Comment:  Wide excision, sentinel lymph node biopsy. No date:  BREAST EXCISIONAL BIOPSY; Right 06/12/2015: BREAST LUMPECTOMY WITH AXILLARY LYMPH NODE DISSECTION;  Right     Comment:  4 mm, T1a,N0 withDCIS with clear margins. ER: 90%, PR               0%; Her 2 neu not overexpressed.  02/03/2017: CATARACT EXTRACTION W/PHACO; Left     Comment:  Procedure: CATARACT EXTRACTION PHACO AND INTRAOCULAR               LENS PLACEMENT (IOC);  Surgeon: Galen Manila, MD;                Location: [facility];  Service: Ophthalmology;  Laterality:              Left;  [place]  00:28 AP% 16.3 CDE 4.68 Fluid pack lot #               [id number] 03/01/2019: CATARACT EXTRACTION W/PHACO; Right     Comment:  Procedure: CATARACT EXTRACTION PHACO AND INTRAOCULAR               LENS PLACEMENT (IOC)  RIGHT;  Surgeon: Galen Manila,              MD;  Location: [facility];  Service:               Ophthalmology;  Laterality: Right; No date: COLONOSCOPY 06/27/2022: COLONOSCOPY WITH PROPOFOL; N/A  Comment:  Procedure: COLONOSCOPY WITH PROPOFOL;  Surgeon: Midge Minium, MD;  Location: [facility];  Service:               Endoscopy;  Laterality: N/A; 06/27/2022: ESOPHAGOGASTRODUODENOSCOPY (EGD) WITH PROPOFOL; N/A     Comment:  Procedure: ESOPHAGOGASTRODUODENOSCOPY (EGD) WITH               PROPOFOL;  Surgeon: Midge Minium, MD;  Location: [facility];  Service: Endoscopy;  Laterality: N/A; 07/29/2022: PARTIAL COLECTOMY; N/A     Comment:  Procedure: PARTIAL COLECTOMY, open left, RNFA to assist;              Surgeon: Leafy Ro, MD;  Location: [facility];                Service: General;  Laterality: N/A;     Reproductive/Obstetrics negative OB ROS                             Anesthesia Physical Anesthesia Plan  ASA: 3  Anesthesia Plan: General   Post-op Pain Management:    Induction: Intravenous  PONV Risk Score and Plan: Propofol infusion and TIVA  Airway Management Planned: Natural  Airway and Nasal Cannula  Additional Equipment:   Intra-op Plan:   Post-operative Plan:   Informed Consent: I have reviewed the patients History and Physical, chart, labs and discussed the procedure including the risks, benefits and alternatives for the proposed anesthesia with the patient or authorized representative who has indicated his/her understanding and acceptance.     Dental Advisory Given  Plan Discussed with: Anesthesiologist, CRNA and Surgeon  Anesthesia Plan Comments: (Patient consented for risks of anesthesia including but not limited to:  - adverse reactions to medications - risk of airway placement if required - damage to eyes, teeth, lips or other oral mucosa - nerve damage due to positioning  - sore throat or hoarseness - Damage to heart, brain, nerves, lungs, other parts of body or loss of life  Patient voiced understanding and assent.)       Anesthesia Quick Evaluation

## 2023-10-06 NOTE — Op Note (Signed)
 [facility] Patient Name: Kristin Miller Procedure Date: 10/06/2023 8:24 AM MRN: [id number] Account #: [id number] Date of Birth: [birth year] Admit Type: Outpatient Age: 88 Room: [facility] ENDO ROOM 4 Gender: Female Note Status: Finalized Instrument Name: Colonscope [id number] Procedure:             Colonoscopy Indications:           High risk colon cancer surveillance: Personal history                         of colon cancer Providers:             Midge Minium MD, MD Referring MD:          Alease Medina (Referring MD) Medicines:             Propofol per Anesthesia Complications:         No immediate complications. Procedure:             Pre-Anesthesia Assessment:                        - Prior to the procedure, a History and Physical was                         performed, and patient medications and allergies were                         reviewed. The patient's tolerance of previous                         anesthesia was also reviewed. The risks and benefits                         of the procedure and the sedation options and risks                         were discussed with the patient. All questions were                         answered, and informed consent was obtained. Prior                         Anticoagulants: The patient has taken no anticoagulant                         or antiplatelet agents. ASA Grade Assessment: II - A                         patient with mild systemic disease. After reviewing                         the risks and benefits, the patient was deemed in                         satisfactory condition to undergo the procedure.                        After obtaining informed consent, the colonoscope was  passed under direct vision. Throughout the procedure,                         the patient's blood pressure, pulse, and oxygen                         saturations were monitored continuously. The                          Colonoscope was introduced through the descending                         colostomy and advanced to the the cecum, identified by                         appendiceal orifice and ileocecal valve. The                         colonoscopy was performed without difficulty. The                         patient tolerated the procedure well. The quality of                         the bowel preparation was excellent. The Colonoscope                         was introduced through the anus and advanced to the                         the surgical stoma. Findings:      The perianal and digital rectal examinations were normal. Pertinent       negatives include normal sphincter tone.      An ulcerated non-obstructing medium-sized mass was found at the surgical       stoma. The mass was non-circumferential. No bleeding was present. This       was biopsied with a cold forceps for histology.      A 4 mm polyp was found in the sigmoid colon. The polyp was sessile. The       polyp was removed with a cold snare. Resection was complete, but the       polyp tissue was not retrieved. Impression:            - Likely malignant tumor at the surgical stoma.                         Biopsied.                        - One 4 mm polyp in the sigmoid colon, removed with a                         cold snare. Complete resection. Polyp tissue not                         retrieved. Recommendation:        - Discharge patient to home.                        -  Resume previous diet.                        - Continue present medications.                        - Await pathology results.                        - Refer to a Careers adviser. Procedure Code(s):     --- Professional ---                        [phone], Colonoscopy through stoma; with removal of                         tumor(s), polyp(s), or other lesion(s) by snare                         technique                        44389, 59, Colonoscopy through stoma;  with biopsy,                         single or multiple Diagnosis Code(s):     --- Professional ---                        G95.621, Personal history of other malignant neoplasm                         of large intestine                        D49.0, Neoplasm of unspecified behavior of digestive                         system CPT copyright 2022 American Medical Association. All rights reserved. The codes documented in this report are preliminary and upon coder review may  be revised to meet current compliance requirements. Midge Minium MD, MD 10/06/2023 9:00:02 AM This report has been signed electronically. Number of Addenda: 0 Note Initiated On: 10/06/2023 8:24 AM Scope Withdrawal Time: 0 hours 8 minutes 23 seconds  Total Procedure Duration: 0 hours 10 minutes 22 seconds  Estimated Blood Loss:  Estimated blood loss: none.      [facility]

## 2023-10-06 NOTE — H&P (Signed)
 Midge Minium, MD [facility] [address] [place], [place] [address] [phone] Fax : [phone]  Primary Care Physician:  Alease Medina, MD Primary Gastroenterologist:  Dr. Servando Snare  Pre-Procedure History & Physical: HPI:  Kristin Miller is a 88 y.o. female is here for an colonoscopy.   Past Medical History:  Diagnosis Date   Allergy    Anemia    H/O AS A CHILD   Arthritis    Back pain    Breast cancer of upper-outer quadrant of right female breast (HCC) 06/12/2015   4 millimeter, T1a, N0; ER 90%, PR 0%, HER-2/neu not overexpressed.  Declined radiation therapy.   Collar bone fracture 05/28/2013   Fractured Collar bone on left   Dyspnea    Dysrhythmia    h/o heart skipping beat years ago   Frequent headaches    h/o migraines   Hypertension    Osteopenia 04/2018   Bone density without significant interval change since 2017.   Pneumonia     Past Surgical History:  Procedure Laterality Date   ABDOMINAL HYSTERECTOMY  1970   Partial   BREAST BIOPSY Right 05/28/2015   INVASIVE MAMMARY CARCINOMA    BREAST BIOPSY Right 06/12/2015   Wide excision, sentinel lymph node biopsy.   BREAST EXCISIONAL BIOPSY Right    BREAST LUMPECTOMY WITH AXILLARY LYMPH NODE DISSECTION Right 06/12/2015   4 mm, T1a,N0 withDCIS with clear margins. ER: 90%, PR 0%; Her 2 neu not overexpressed.    CATARACT EXTRACTION W/PHACO Left 02/03/2017   Procedure: CATARACT EXTRACTION PHACO AND INTRAOCULAR LENS PLACEMENT (IOC);  Surgeon: Galen Manila, MD;  Location: [facility] ORS;  Service: Ophthalmology;  Laterality: Left;  [place]  00:28 AP% 16.3 CDE 4.68 Fluid pack lot # [id number]   CATARACT EXTRACTION W/PHACO Right 03/01/2019   Procedure: CATARACT EXTRACTION PHACO AND INTRAOCULAR LENS PLACEMENT (IOC)  RIGHT;  Surgeon: Galen Manila, MD;  Location: [facility];  Service: Ophthalmology;  Laterality: Right;   COLONOSCOPY     COLONOSCOPY WITH PROPOFOL N/A 06/27/2022   Procedure: COLONOSCOPY  WITH PROPOFOL;  Surgeon: Midge Minium, MD;  Location: [facility];  Service: Endoscopy;  Laterality: N/A;   ESOPHAGOGASTRODUODENOSCOPY (EGD) WITH PROPOFOL N/A 06/27/2022   Procedure: ESOPHAGOGASTRODUODENOSCOPY (EGD) WITH PROPOFOL;  Surgeon: Midge Minium, MD;  Location: [facility];  Service: Endoscopy;  Laterality: N/A;   PARTIAL COLECTOMY N/A 07/29/2022   Procedure: PARTIAL COLECTOMY, open left, RNFA to assist;  Surgeon: Leafy Ro, MD;  Location: [facility] ORS;  Service: General;  Laterality: N/A;    Prior to Admission medications   Medication Sig Start Date End Date Taking? Authorizing Provider  alendronate (FOSAMAX) 70 MG tablet Take 1 tablet (70 mg total) by mouth every 7 (seven) days for 48 doses. Take with a full glass of water on an empty stomach. 07/21/23 06/15/24 Yes Ziglar, Eli Phillips, MD  amLODipine (NORVASC) 5 MG tablet Take 1 tablet (5 mg total) by mouth daily. 07/28/23  Yes Ziglar, Eli Phillips, MD  losartan-hydrochlorothiazide (HYZAAR) 100-12.5 MG tablet Take 1 tablet by mouth daily. 07/21/23  Yes Ziglar, Eli Phillips, MD  acetaminophen (TYLENOL) 500 MG tablet Take 500 mg by mouth every 6 (six) hours as needed for pain.    [provider]  latanoprost (XALATAN) 0.005 % ophthalmic solution 1 drop at bedtime. Eye doctor 12/18/20   [provider]  methocarbamol (ROBAXIN) 500 MG tablet Take 1 tablet (500 mg total) by mouth every 8 (eight) hours as needed for muscle spasms. 08/01/22  Donovan Kail, PA-C  pantoprazole (PROTONIX) 40 MG tablet Take 1 tablet (40 mg total) by mouth 2 (two) times daily before a meal. 08/05/22   Enedina Finner, MD  promethazine-dextromethorphan (PROMETHAZINE-DM) 6.25-15 MG/5ML syrup Take 2.5 mLs by mouth 4 (four) times daily as needed. 07/19/22   Katha Cabal, DO    Allergies as of 09/02/2023 - Review Complete 09/02/2023  Allergen Reaction Noted   Combigan [brimonidine tartrate-timolol]  01/30/2021   Lisinopril Cough 08/22/2020   Other  Rash 06/06/2015    Family History  Problem Relation Age of Onset   Mental illness Mother        Dementia   Stroke Father    Lung cancer Brother    Breast cancer Other        dx under 50   Brain cancer Granddaughter 12       glioblastoma    Social History   Socioeconomic History   Marital status: Married    Spouse name: ,Delton See   Number of children: 3   Years of education: GED   Highest education level: 12th grade  Occupational History   Occupation: Retired  Tobacco Use   Smoking status: Never    Passive exposure: Never   Smokeless tobacco: Never   Tobacco comments:    smoking cessation materials not required  Vaping Use   Vaping status: Never Used  Substance and Sexual Activity   Alcohol use: No   Drug use: No   Sexual activity: Not Currently  Other Topics Concern   Not on file  Social History Narrative   Not on file   Social Drivers of Health   Financial Resource Strain: Low Risk  (01/09/2023)   Received from [facility], [facility]   Overall Financial Resource Strain (CARDIA)    Difficulty of Paying Living Expenses: Not very hard  Food Insecurity: Unknown (01/09/2023)   Received from [facility], [facility]   Hunger Vital Sign    Worried About Running Out of Food in the Last Year: Never true    Ran Out of Food in the Last Year: Not on file  Transportation Needs: Unknown (01/09/2023)   Received from [facility], [facility]   [facility] - Transportation    In the past 12 months, has lack of transportation kept you from medical appointments or from getting medications?: No    Lack of Transportation (Non-Medical): Not on file  Physical Activity: Inactive (09/25/2021)   Exercise Vital Sign    Days of Exercise per Week: 0 days    Minutes of Exercise per Session: 0 min  Stress: No Stress Concern Present (09/25/2021)   [employer] of Occupational Health -  Occupational Stress Questionnaire    Feeling of Stress : Not at all  Social Connections: Moderately Integrated (09/25/2021)   Social Connection and Isolation Panel [NHANES]    Frequency of Communication with Friends and Family: More than three times a week    Frequency of Social Gatherings with Friends and Family: More than three times a week    Attends Religious Services: More than 4 times per year    Active Member of [employer] or Organizations: No    Attends Banker Meetings: Never    Marital Status: Married  Catering manager Violence: Not At Risk (08/04/2022)   Humiliation, Afraid, Rape, and Kick questionnaire    Fear of Current or Ex-Partner: No    Emotionally Abused: No  Physically Abused: No    Sexually Abused: No    Review of Systems: See HPI, otherwise negative ROS  Physical Exam: BP (!) 146/72   Pulse 78   Temp 97.8 F (36.6 C) (Temporal)   Resp 14   Wt 58.5 kg   SpO2 99%   BMI 21.47 kg/m  General:   Alert,  pleasant and cooperative in NAD Head:  Normocephalic and atraumatic. Neck:  Supple; no masses or thyromegaly. Lungs:  Clear throughout to auscultation.    Heart:  Regular rate and rhythm. Abdomen:  Soft, nontender and nondistended. Normal bowel sounds, without guarding, and without rebound.   Neurologic:  Alert and  oriented x4;  grossly normal neurologically.  Impression/Plan: Kristin Miller is here for an colonoscopy to be performed for history of colon cancer  Risks, benefits, limitations, and alternatives regarding  colonoscopy have been reviewed with the patient.  Questions have been answered.  All parties agreeable.   Midge Minium, MD  10/06/2023, 8:25 AM

## 2023-10-06 NOTE — Anesthesia Postprocedure Evaluation (Signed)
 Anesthesia Post Note  Patient: Kristin Miller  Procedure(s) Performed: COLONOSCOPY WITH PROPOFOL POLYPECTOMY  Patient location during evaluation: Endoscopy Anesthesia Type: General Level of consciousness: awake and alert Pain management: pain level controlled Vital Signs Assessment: post-procedure vital signs reviewed and stable Respiratory status: spontaneous breathing, nonlabored ventilation, respiratory function stable and patient connected to nasal cannula oxygen Cardiovascular status: blood pressure returned to baseline and stable Postop Assessment: no apparent nausea or vomiting Anesthetic complications: no   No notable events documented.   Last Vitals:  Vitals:   10/06/23 0810 10/06/23 0900  BP: (!) 146/72 130/60  Pulse: 78   Resp: 14 17  Temp: 36.6 C (!) 36.1 C  SpO2: 99%     Last Pain:  Vitals:   10/06/23 0910  TempSrc:   PainSc: 0-No pain                 Cleda Mccreedy Dezi Brauner

## 2023-10-06 NOTE — Transfer of Care (Signed)
 Immediate Anesthesia Transfer of Care Note  Patient: Kristin Miller  Procedure(s) Performed: COLONOSCOPY WITH PROPOFOL  Patient Location: PACU  Anesthesia Type:General  Level of Consciousness: sedated  Airway & Oxygen Therapy: Patient Spontanous Breathing  Post-op Assessment: Report given to RN and Post -op Vital signs reviewed and stable  Post vital signs: Reviewed and stable  Last Vitals:  Vitals Value Taken Time  BP    Temp    Pulse 70 10/06/23 0900  Resp 24 10/06/23 0900  SpO2 98 % 10/06/23 0900  Vitals shown include unfiled device data.  Last Pain:  Vitals:   10/06/23 0810  TempSrc: Temporal  PainSc: 0-No pain         Complications: No notable events documented.

## 2023-10-07 ENCOUNTER — Encounter: Payer: Self-pay | Admitting: Gastroenterology

## 2023-10-07 LAB — SURGICAL PATHOLOGY

## 2023-10-14 ENCOUNTER — Ambulatory Visit: Admitting: Surgery

## 2023-10-14 ENCOUNTER — Encounter: Payer: Self-pay | Admitting: Surgery

## 2023-10-14 VITALS — BP 149/85 | HR 86 | Temp 98.4°F | Ht 60.0 in | Wt 134.4 lb

## 2023-10-14 DIAGNOSIS — C186 Malignant neoplasm of descending colon: Secondary | ICD-10-CM | POA: Diagnosis not present

## 2023-10-14 DIAGNOSIS — C189 Malignant neoplasm of colon, unspecified: Secondary | ICD-10-CM | POA: Diagnosis not present

## 2023-10-14 DIAGNOSIS — K9400 Colostomy complication, unspecified: Secondary | ICD-10-CM | POA: Diagnosis not present

## 2023-10-14 NOTE — Patient Instructions (Signed)
 Colostomy Home Guide, Adult  A colostomy is a surgically created opening (ostomy) that brings a piece of the large intestine through an opening in the abdomen to create a stoma. The stoma allows stool (feces) and gas (flatus) to leave the body. A stoma may be a variety of shapes and sizes. An ostomy pouch or bag is used to cover the stoma and to contain the stool and gas. A colostomy may be temporary or permanent, depending on the medical reason for your surgery. After surgery, you will need to learn to care for your ostomy. This includes emptying and changing the colostomy bag as needed. There are many different types of bags or pouching options, including one-piece, two-piece, clear, fabric covered, flat or curved (convex), drainable, and closed or vented options. Your health care provider will help you select the best pouch for you. How to care for the stoma Your stoma should look pink, red, and moist, like the inside of your cheek. It should not be painful to touch, but it may bleed easily when rubbed or bumped. Soon after surgery, the stoma may be swollen, but this goes away within 6 weeks. To care for the stoma: Keep the skin around the stoma clean and dry. Use a clean, soft washcloth to gently wash the stoma and the skin around it. Clean using a circular motion, and wipe away from the stoma opening, nottoward it. Use warm water and only use cleansers, such as mild soap and stoma-safe adhesive remover, recommended by your health care provider. Inspect and dry the skin around the stoma. Use stoma powder or skin protectant on your skin as told by your health care provider. Do not use any other powders, gels, wipes, or creams on the skin around the stoma. These may keep pouch adhesive from sticking or may cause injury to your stoma. Check the stoma area every day for signs of infection or problems. Check for: New or worsening redness, warmth, swelling, irritation, itching, or pain around the  stoma. New or worsening changes to the stoma, such as bleeding, trauma, or a dark and dusky color. Changes to the drainage from the stoma, such as pus, blood, a large amount of liquid drainage, or little to no stool drainage. Measure the stoma opening regularly and record the size. Watch for changes, such as the stoma getting longer, becoming flat, or falling below skin level. (It is normal for the stoma to get smaller as the swelling goes away.) Share this information with your health care provider. Changes in the size or shape of the stoma may require a different style of pouch to be used. How to empty the colostomy bag     Empty your bag at bedtime, before physical activity or sexual relations, and whenever it is one-third to one-half full. Do not let the bag get more than half-full with stool or gas. The bag could leak if it gets too full. Some colostomy bags have a built-in gas release valve that releases gas often throughout the day. Follow these basic steps for emptying a drainable pouch: Prepare the toilet or container: If draining directly into the toilet, put several pieces of toilet paper into the toilet water. This will prevent splashing as you empty the stool into the toilet. Sit far back on the toilet seat. If draining into a container, place a few pieces of moistened toilet paper in the bottom of the container. This can help when emptying the container. Remove the clip or the hook-and-loop fastener from  the tail end of the bag. Unroll the tail, then empty the stool into the toilet or container. Clean the tail with toilet paper or a moist towelette. Reroll the tail, and close it with the clip or the hook-and-loop fastener. Wash your hands. How to change the colostomy bag Change your bag every 3-4 days or as often as told by your health care provider. Also change the bag if it is leaking or separating from the skin, or if your skin around the stoma looks or feels irritated. Irritated  skin may be a sign that the bag is leaking. Always have colostomy supplies with you, and follow these basic steps: Have paper towels or tissues and a trash bag nearby to clean any discharge and dispose of the soiled pouch. Remove the old pouching appliance. Use your fingers, a warm, moist cloth, or a stoma-safe adhesive remover to gently remove the pouch and remove adhesive residue. Clean the stoma area with water or with mild soap and water, as directed. Use water to rinse away any soap. Dry the skin. You may use the cool setting on a hair dryer to do this. Use a tracing pattern (template) to cut the skin barrier to the size needed. The opening should be large enough to fit around the stoma, but you should not see exposed skin. If you are using a two-piece bag, attach the bag and the base to each other. Add the barrier ring, if you use one. If directed, apply stoma powder or skin protectant to the skin. Warm the pouch base with your hands or blow with a hair dryer for 5-10 seconds. Remove the paper from the adhesive backing of the pouch base. Press the adhesive backing onto the skin around the stoma. Gently rub the pouch base onto the skin. This creates heat that helps the barrier to stick. Apply barrier strips to the edges of the pouch, if desired. Dispose of the soiled pouch, and wash your hands. General recommendations Avoid wearing tight clothes or having anything press directly on your stoma or bag. Change your clothing whenever it is soiled or damp. You may shower or bathe with the bag on or off. Do not use harsh or oily soaps or lotions. Dry the skin and bag after bathing. Do not shower or bathe right after changing the pouch. Wait 4-5 hours. Store all supplies in a cool, dry place. Do not leave supplies in extreme heat because some parts can melt or not stick as well. Whenever you leave home, take extra clothing and an extra ostomy pouch with you. If your bag gets wet, you can dry it  with a hair dryer on the cool setting. To prevent odor, you may put drops of ostomy deodorizer in the bag. If recommended by your health care provider, put ostomy lubricant inside the bag. This helps stool slide out of the bag more easily and completely. Contact a health care provider if: You have new or worsening redness, warmth, swelling, irritation, itching, or pain around the stoma. You have new or worsening changes to the stoma, such as bleeding, trauma, or a dark and dusky color. There are changes to the drainage from the stoma, such as pus, blood, a large amount of liquid drainage, or little to no stool drainage. Your stoma extends in or out farther than normal. You need to change your bag every day or have frequent leaks. You have a fever. Get help right away if: Your stool is bloody. You have nausea or  you vomit. You have trouble breathing. These symptoms may be an emergency. Get help right away. Call 911. Do not wait to see if the symptoms will go away. Do not drive yourself to the hospital. Summary Measure your stoma opening regularly and record the size. Watch for changes. Empty your bag at bedtime, before physical activity or sexual relations, and whenever it is one-third to one-half full. Do not let the bag get more than half-full with stool or gas. Change your bag every 3-4 days or as often as told by your health care provider. Whenever you leave home, take extra clothing and an extra ostomy pouch with you. This information is not intended to replace advice given to you by your health care provider. Make sure you discuss any questions you have with your health care provider. Document Revised: 07/17/2021 Document Reviewed: 07/17/2021 Elsevier Patient Education  2024 [employer].

## 2023-10-15 ENCOUNTER — Other Ambulatory Visit: Payer: Self-pay

## 2023-10-15 ENCOUNTER — Telehealth: Payer: Self-pay | Admitting: Surgery

## 2023-10-15 DIAGNOSIS — C186 Malignant neoplasm of descending colon: Secondary | ICD-10-CM

## 2023-10-15 NOTE — Progress Notes (Signed)
 Outpatient Surgical Follow Up    Kristin Miller is an 88 y.o. female.   Chief Complaint  Patient presents with   Follow-up    Colon bx    HPI: Kristin Miller is a very pleasant 88 year old female status post sigmoid colectomy and abdominal pelvic wall resection for colon cancer  07/2022 .t4bn0 , dMMR [loss of PMS2] -  Negative margins.  She completed 5 weeks of adjuvant XRT, She is doing well considering , she has gained weight.  She is tolerating diet she is ambulating and her colostomy is working well.  No fevers no chills.  She declined adjuvant chemo.  She has noticed a left flank bulge.  No pain. Eating well and her colostomy is working well. She Did have a recent CT that I personally reviewed showing no evidence of metastatic disease.  A left flank hernia without evidence of complications some loss of domain. HAd A recent colonoscopy that I have personally reviewed showing evidence of a malignant lesion within the colostomy portion.  Past Medical History:  Diagnosis Date   Allergy    Anemia    H/O AS A CHILD   Arthritis    Back pain    Breast cancer of upper-outer quadrant of right female breast (HCC) 06/12/2015   4 millimeter, T1a, N0; ER 90%, PR 0%, HER-2/neu not overexpressed.  Declined radiation therapy.   Collar bone fracture 05/28/2013   Fractured Collar bone on left   Dyspnea    Dysrhythmia    h/o heart skipping beat years ago   Frequent headaches    h/o migraines   Hypertension    Osteopenia 04/2018   Bone density without significant interval change since 2017.   Pneumonia     Past Surgical History:  Procedure Laterality Date   ABDOMINAL HYSTERECTOMY  1970   Partial   BREAST BIOPSY Right 05/28/2015   INVASIVE MAMMARY CARCINOMA    BREAST BIOPSY Right 06/12/2015   Wide excision, sentinel lymph node biopsy.   BREAST EXCISIONAL BIOPSY Right    BREAST LUMPECTOMY WITH AXILLARY LYMPH NODE DISSECTION Right 06/12/2015   4 mm, T1a,N0 withDCIS with clear margins. ER:  90%, PR 0%; Her 2 neu not overexpressed.    CATARACT EXTRACTION W/PHACO Left 02/03/2017   Procedure: CATARACT EXTRACTION PHACO AND INTRAOCULAR LENS PLACEMENT (IOC);  Surgeon: Galen Manila, MD;  Location: [facility];  Service: Ophthalmology;  Laterality: Left;  [place]  00:28 AP% 16.3 CDE 4.68 Fluid pack lot # [id number]   CATARACT EXTRACTION W/PHACO Right 03/01/2019   Procedure: CATARACT EXTRACTION PHACO AND INTRAOCULAR LENS PLACEMENT (IOC)  RIGHT;  Surgeon: Galen Manila, MD;  Location: [facility];  Service: Ophthalmology;  Laterality: Right;   COLONOSCOPY     COLONOSCOPY WITH PROPOFOL N/A 06/27/2022   Procedure: COLONOSCOPY WITH PROPOFOL;  Surgeon: Midge Minium, MD;  Location: [facility];  Service: Endoscopy;  Laterality: N/A;   COLONOSCOPY WITH PROPOFOL N/A 10/06/2023   Procedure: COLONOSCOPY WITH PROPOFOL;  Surgeon: Midge Minium, MD;  Location: [facility] END[facility];  Service: Endoscopy;  Laterality: N/A;   ESOPHAGOGASTRODUODENOSCOPY (EGD) WITH PROPOFOL N/A 06/27/2022   Procedure: ESOPHAGOGASTRODUODENOSCOPY (EGD) WITH PROPOFOL;  Surgeon: Midge Minium, MD;  Location: [facility];  Service: Endoscopy;  Laterality: N/A;   PARTIAL COLECTOMY N/A 07/29/2022   Procedure: PARTIAL COLECTOMY, open left, RNFA to assist;  Surgeon: Leafy Ro, MD;  Location: [facility];  Service: General;  Laterality: N/A;   POLYPECTOMY  10/06/2023   Procedure: POLYPECTOMY;  Surgeon: Midge Minium, MD;  Location: [facility] ENDOSCOPY;  Service: Endoscopy;;    Family History  Problem Relation Age of Onset   Mental illness Mother        Dementia   Stroke Father    Lung cancer Brother    Breast cancer Other        dx under 57   Brain cancer Granddaughter 12       glioblastoma    Social History:  reports that she has never smoked. She has never been exposed to tobacco smoke. She has never used smokeless tobacco. She reports that she does not drink alcohol and does not use drugs.  Allergies:   Allergies  Allergen Reactions   Combigan [Brimonidine Tartrate-Timolol]     Redness, burning, irritation    Lisinopril Cough   Other Rash    SUTURES-(PARTIAL HYSTERECTOMY)    Medications reviewed.    ROS Full ROS performed and is otherwise negative other than what is stated in HPI   BP (!) 149/85   Pulse 86   Temp 98.4 F (36.9 C) (Oral)   Ht 5' (1.524 m)   Wt 134 lb 6.4 oz (61 kg)   SpO2 97%   BMI 26.25 kg/m   Physical Exam  Physical Exam Vitals and nursing note reviewed. Exam conducted with a chaperone present.  Constitutional:      General: She is not in acute distress.    Appearance: Normal appearance. She is normal weight.  Cardiovascular:     Rate and Rhythm: Normal rate and regular rhythm.     Heart sounds: No murmur heard.    No friction rub.  Pulmonary:     Effort: Pulmonary effort is normal. No respiratory distress.     Breath sounds: Normal breath sounds. No stridor. No wheezing or rhonchi.  Abdominal:     General: Abdomen is flat. There is no distension.     Palpations: Abdomen is soft. There is no mass.     Tenderness: There is no abdominal tenderness.     Hernia: No hernia is present.     Comments: Ostomy in place working and widely patent.  I have removed the appliance and I cannot see a mass within the mucosa,.  Midline scar healed . There is a good size left flank hernia defect w some loss of domain Musculoskeletal:        General: Normal range of motion.     Cervical back: Normal range of motion and neck supple. No rigidity or tenderness.  Skin:    General: Skin is warm and dry.     Capillary Refill: Capillary refill takes less than 2 seconds.  Neurological:     General: No focal deficit present.     Mental Status: She is alert and oriented to person, place, and time.  Psychiatric:        Mood and Affect: Mood normal.        Behavior: Behavior normal.        Thought Content: Thought content normal.        Judgment: Judgment normal.    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  Prior adenocarcinoma year ago was at T4 with invasion into the abdominal wall.  This left a flank hernia.  Certainly she has a significant major issue including a synchronous new cancer in addition to as significant defect to the left of the abdominal wall.  This certainly poses significant challenges.  I do think that colectomy certainly indicated given the sinkers nature of her colon  cancer.  I have discussed this with her in detail.  I have also discussed this case with Dr. Cathie Hoops the oncologist and likely require additional imaging.  Initially I had sent her for second opinion to [facility] for abdominal wall reconstruction but given that the diagnosis of cancer this changes things.  One of the good things on her is at this time she is stronger currently is recovering well ambulated.  On the other hand she is 84 she had radiation therapy to the her abdomen and this is going to be the second time around with significant weakness to the abdominal wall.  Ideal circumstances will probably be able to perform colectomy and abdominal wall reconstruction during the same operative setting using absorbable mesh.  Given her age frailty and the case being potentially contaminated I would probably be against permanent synthetic mesh Wishes to keep the appointment at [facility] and I do think that is very reasonable.  She also wants to come back in a couple weeks to close the loop.  Please note I spent greater than 45 minutes in this encounter including extensive review of medical records, personally reviewing imaging studies, pathology report, counseling the patient and the husband.  sHE IS Very appreciative  Sterling Big, MD [facility] General Surgeon

## 2023-10-15 NOTE — Telephone Encounter (Signed)
 Patient called and said that she talked with the people that the dr referred her to general surgery that was a [facility] Dr. but they said that that would not do anything till the cancer is taken care of and wants to know what to do next. Please advise patient. [phone] or [phone] cell.

## 2023-10-20 DIAGNOSIS — Z933 Colostomy status: Secondary | ICD-10-CM | POA: Diagnosis not present

## 2023-10-20 DIAGNOSIS — L24B3 Irritant contact dermatitis related to fecal or urinary stoma or fistula: Secondary | ICD-10-CM | POA: Diagnosis not present

## 2023-10-21 ENCOUNTER — Other Ambulatory Visit: Payer: Self-pay

## 2023-10-21 ENCOUNTER — Ambulatory Visit (INDEPENDENT_AMBULATORY_CARE_PROVIDER_SITE_OTHER): Admitting: Surgery

## 2023-10-21 ENCOUNTER — Encounter: Payer: Self-pay | Admitting: Surgery

## 2023-10-21 ENCOUNTER — Telehealth: Payer: Self-pay | Admitting: Oncology

## 2023-10-21 VITALS — BP 159/84 | HR 71 | Temp 97.9°F | Ht 60.0 in | Wt 135.0 lb

## 2023-10-21 DIAGNOSIS — C189 Malignant neoplasm of colon, unspecified: Secondary | ICD-10-CM

## 2023-10-21 DIAGNOSIS — C186 Malignant neoplasm of descending colon: Secondary | ICD-10-CM

## 2023-10-21 MED ORDER — POLYETHYLENE GLYCOL 3350 17 GM/SCOOP PO POWD: ORAL | 0 refills | Status: DC

## 2023-10-21 MED ORDER — METRONIDAZOLE 500 MG PO TABS: ORAL_TABLET | ORAL | 0 refills | Status: DC

## 2023-10-21 MED ORDER — NEOMYCIN SULFATE 500 MG PO TABS: ORAL_TABLET | ORAL | 0 refills | Status: DC

## 2023-10-21 NOTE — H&P (View-Only) (Signed)
 Outpatient Surgical Follow Up  10/21/2023  Kristin Miller is an 88 y.o. female.   Chief Complaint  Patient presents with   Follow-up    Colon Adenocarcinoma     HPI: Kristin Miller is a very pleasant 88 year old female status post sigmoid colectomy and abdominal pelvic wall resection for colon cancer  07/2022 .t4bn0 , dMMR [loss of PMS2] -  Negative margins.  She completed 5 weeks of adjuvant XRT, She is doing well considering , she has gained weight.  She is tolerating diet she is ambulating and her colostomy is working well.  No fevers no chills.  She declined adjuvant chemo.  She has noticed a left flank bulge.  No pain. Eating well and her colostomy is working well. She Did have a recent CT that I personally reviewed showing no evidence of metastatic disease.  A left flank hernia without evidence of complications some loss of domain. HAd A recent colonoscopy that I have personally reviewed showing evidence of a malignant lesion within the colostomy portion. She went to [facility] abd wall Lexicographer and given malignancy refer back to [place]. I have d/w Dr Cathie Hoops in detail, we will obtain updted CT C/A/P. She will need segmental vs total colectomy. We d/w the pt and husband about the options. Doing subtotal colectomy may be very significant on her and sghe might not be able to tolerated it. Another alternative is partial colectomy robotic assisted w colostomy. I do think this is the safest options. Given abd wall radiation I would not do hernia repair and will tackle this at a later point. They are in agreement.      Past Medical History:  Diagnosis Date   Allergy    Anemia    H/O AS A CHILD   Arthritis    Back pain    Breast cancer of upper-outer quadrant of right female breast (HCC) 06/12/2015   4 millimeter, T1a, N0; ER 90%, PR 0%, HER-2/neu not overexpressed.  Declined radiation therapy.   Collar bone fracture 05/28/2013   Fractured Collar bone on left   Dyspnea    Dysrhythmia    h/o  heart skipping beat years ago   Frequent headaches    h/o migraines   Hypertension    Osteopenia 04/2018   Bone density without significant interval change since 2017.   Pneumonia     Past Surgical History:  Procedure Laterality Date   ABDOMINAL HYSTERECTOMY  1970   Partial   BREAST BIOPSY Right 05/28/2015   INVASIVE MAMMARY CARCINOMA    BREAST BIOPSY Right 06/12/2015   Wide excision, sentinel lymph node biopsy.   BREAST EXCISIONAL BIOPSY Right    BREAST LUMPECTOMY WITH AXILLARY LYMPH NODE DISSECTION Right 06/12/2015   4 mm, T1a,N0 withDCIS with clear margins. ER: 90%, PR 0%; Her 2 neu not overexpressed.    CATARACT EXTRACTION W/PHACO Left 02/03/2017   Procedure: CATARACT EXTRACTION PHACO AND INTRAOCULAR LENS PLACEMENT (IOC);  Surgeon: Galen Manila, MD;  Location: [facility];  Service: Ophthalmology;  Laterality: Left;  [place]  00:28 AP% 16.3 CDE 4.68 Fluid pack lot # [id number]   CATARACT EXTRACTION W/PHACO Right 03/01/2019   Procedure: CATARACT EXTRACTION PHACO AND INTRAOCULAR LENS PLACEMENT (IOC)  RIGHT;  Surgeon: Galen Manila, MD;  Location: [facility];  Service: Ophthalmology;  Laterality: Right;   COLONOSCOPY     COLONOSCOPY WITH PROPOFOL N/A 06/27/2022   Procedure: COLONOSCOPY WITH PROPOFOL;  Surgeon: Midge Minium, MD;  Location: [name];  Service: Endoscopy;  Laterality: N/A;  COLONOSCOPY WITH PROPOFOL N/A 10/06/2023   Procedure: COLONOSCOPY WITH PROPOFOL;  Surgeon: Midge Minium, MD;  Location: [facility] ENDOSCOPY;  Service: Endoscopy;  Laterality: N/A;   ESOPHAGOGASTRODUODENOSCOPY (EGD) WITH PROPOFOL N/A 06/27/2022   Procedure: ESOPHAGOGASTRODUODENOSCOPY (EGD) WITH PROPOFOL;  Surgeon: Midge Minium, MD;  Location: [facility];  Service: Endoscopy;  Laterality: N/A;   PARTIAL COLECTOMY N/A 07/29/2022   Procedure: PARTIAL COLECTOMY, open left, RNFA to assist;  Surgeon: Leafy Ro, MD;  Location: [facility];  Service: General;  Laterality: N/A;    POLYPECTOMY  10/06/2023   Procedure: POLYPECTOMY;  Surgeon: Midge Minium, MD;  Location: [facility] ENDOSCOPY;  Service: Endoscopy;;    Family History  Problem Relation Age of Onset   Mental illness Mother        Dementia   Stroke Father    Lung cancer Brother    Breast cancer Other        dx under 48   Brain cancer Granddaughter 12       glioblastoma    Social History:  reports that she has never smoked. She has never been exposed to tobacco smoke. She has never used smokeless tobacco. She reports that she does not drink alcohol and does not use drugs.  Allergies:  Allergies  Allergen Reactions   Combigan [Brimonidine Tartrate-Timolol]     Redness, burning, irritation    Lisinopril Cough   Other Rash    SUTURES-(PARTIAL HYSTERECTOMY)    Medications reviewed.    ROS Full ROS performed and is otherwise negative other than what is stated in HPI   BP (!) 159/84   Pulse 71   Temp 97.9 F (36.6 C) (Oral)   Ht 5' (1.524 m)   Wt 135 lb (61.2 kg)   SpO2 98%   BMI 26.37 kg/m   Physical Exam  Physical Exam Vitals and nursing note reviewed. Exam conducted with a chaperone present.  Constitutional:      General: She is not in acute distress.    Appearance: Normal appearance. She is normal weight.  Cardiovascular:     Rate and Rhythm: Normal rate and regular rhythm.     Heart sounds: No murmur heard.    No friction rub.  Pulmonary:     Effort: Pulmonary effort is normal. No respiratory distress.     Breath sounds: Normal breath sounds. No stridor. No wheezing or rhonchi.  Abdominal:     General: Abdomen is flat. There is no distension.     Palpations: Abdomen is soft. There is no mass.     Tenderness: There is no abdominal tenderness.     Hernia: No hernia is present.     Comments: Ostomy in place working and widely patent.  I have removed the appliance and I cannot see a mass within the mucosa,.  Midline scar healed . There is a good size left flank hernia defect w some  loss of domain Musculoskeletal:        General: Normal range of motion.     Cervical back: Normal range of motion and neck supple. No rigidity or tenderness.  Skin:    General: Skin is warm and dry.     Capillary Refill: Capillary refill takes less than 2 seconds.  Neurological:     General: No focal deficit present.     Mental Status: She is alert and oriented to person, place, and time.  Psychiatric:        Mood and Affect: Mood normal.  Behavior: Behavior normal.        Thought Content: Thought content normal.        Judgment: Judgment normal   Assessment/Plan: 88 year old female with synchronous adenocarcinoma.  Prior adenocarcinoma year ago was at T4 with invasion into the abdominal wall.  Margins were negative. This left a flank hernia.  Certainly she has a significant major issue including a synchronous new cancer in addition to as significant defect to the left of the abdominal wall.  T his certainly poses significant challenges.  I do think that colectomy certainly indicated given the  nature of her colon cancer.  I have discussed this with her in detail.    I have also discussed this case with Dr. Cathie Hoops the oncologist and likely require additional imaging.   We will tentatively plan for partial colectomy robotic assisted and colostomy. Procedure d/w the pt in detail Risks, benefits and possible complications including but not limited to bleeding, infection, intra-abdominal injuries, She understand and wishes to proceed. Please note I spent greater than 45 minutes in this encounter including extensive review of medical records, personally reviewing imaging studies, pathology report, counseling the patient and the husband.  sHE IS Very appreciative     Sterling Big, MD [facility] General Surgeon

## 2023-10-21 NOTE — Patient Instructions (Signed)
 Colon Surgery Prep  Your surgery is scheduled for 10/27/2023 Your last solid meal should be on 10/24/2023 (3 days before surgery) You should start a Clear liquid diet on 10/25/2023 (2 days before surgery) Continue clear liquid diet on 10/26/2023  A clear diet is anything you can see through(coffee, tea, coke, soda, ginger ale, water, Gatorade,apple juice, chicken or beef broth, jello with no fruit, popsicles, italian ice, etc.). No milk, creamers, and no Red liquids.                                                      Do Not Drink any RED Liquids  ** Be sure to pick up your Dulcolax, Miralax, Neomycin, Metronidazole and 64 oz bottle of Gatorade, no red. **  -Your medications have been called into [facility] in [place].   The day before surgery: 10/27/2023  - At 8:00 am, take all 4 Dulcolax tablets(5 mg each) along with 2 Neomycin tablets and 2 Metronidazole tablets.  - At 2:00 pm take 2 Neomycin tablets and 2 Metronidazole tablets. Also you will start your Miralax prep. Mix the entire bottle of Miralax into the 64 oz of Gatorade and mix well. Drink an 8oz(1 cup) glass of the mixture every 15-20 minutes until it is all gone. You may continue your clear liquid diet until midnight. After midnight follow the directions from Pre Admit testing as to what you may have.   -At 8:00 pm, take the last 2 tablets of Neomycin and 2 tablets of Metronidazole.   *If you are having a colostomy reversal, use 1 Fleets enema rectally 2-3 days prior to your surgery date.    We have discussed removing a portion of your damaged colon through 4 small incisions today. We will schedule this surgery at [facility] with Dr. Everlene Farrier. Please plan a hospital stay of 3-7 days for surgery and recovery time.  If you are on any injectable weight loss medication, you will need to stop taking your GLP-1 injectable (weight loss) medications 8 days before your surgery to avoid any complications with anesthesia.   We will have  you complete a bowel prep prior to your surgery. Please see information provided.  You have also been given a (Blue) Pre-Care Sheet with more information regarding your particular surgery. Our surgery scheduler will call you to verify surgery date and to go over information.  Please review all information given.  You will need to arrange to be out of work for approximately 2 weeks and then you may return with a lifting restriction for 4 more weeks. If you have FMLA or Disability paperwork that needs to be filled out, please have your company fax your paperwork to [phone] or you may drop this by either office. This paperwork will be filled out within 3 days after your surgery has been completed.  Please call our office with any questions or concerns prior to your scheduled surgery.   Laparoscopic Colectomy Laparoscopic colectomy is surgery to remove part or all of the large intestine (colon). This procedure may be used to treat several conditions, including: Inflammation and infection of the colon (diverticulitis). Tumors or masses in the colon. Inflammatory bowel disease, such as Crohn disease or ulcerative colitis. Colectomy is an option when symptoms cannot be controlled with medicines. Bleeding from the colon that cannot be controlled  by another method. Blockage or obstruction of the colon.  Tell a health care provider about: Any allergies you have. All medicines you are taking, including vitamins, herbs, eye drops, creams, and over-the-counter medicines. Any problems you or family members have had with anesthetic medicines. Any blood disorders you have. Any surgeries you have had. Any medical conditions you have. What are the risks? Generally, this is a safe procedure. However, problems may occur, including: Infection. Bleeding. Allergic reactions to medicines or dyes. Damage to other structures or organs. Leaking from where the colon was sewn together. Future blockage of  the small intestines from scar tissue. Another surgery may be needed to repair this. Needing to convert to an open procedure. Complications such as damage to other organs or excessive bleeding may require the surgeon to convert from a laparoscopic procedure to an open procedure. This involves making a larger incision in the abdomen.  What happens before the procedure?  Medicines Ask your health care provider about: Changing or stopping your regular medicines. This is especially important if you are taking diabetes medicines or blood thinners. Taking medicines such as aspirin and ibuprofen. These medicines can thin your blood. Do not take these medicines before your procedure if your health care provider instructs you not to. You may be given antibiotic medicine to clean out bacteria from your colon. Follow the directions carefully and take the medicine at the correct time. General instructions You may be prescribed an oral bowel prep to clean out your colon in preparation for the surgery: Follow instructions from your health care provider about how to do this. Do not eat or drink anything else after you have started the bowel prep, unless your health care provider tells you it is safe to do so. Do not use any products that contain nicotine or tobacco, such as cigarettes and e-cigarettes. If you need help quitting, ask your health care provider. What happens during the procedure? To reduce your risk of infection: Your health care team will wash or sanitize their hands. Your skin will be washed with soap. An IV tube will be inserted into one of your veins to deliver fluid and medication. You will be given one of the following: A medicine to help you relax (sedative). A medicine to make you fall asleep (general anesthetic). Small monitors will be connected to your body. They will be used to check your heart, blood pressure, and oxygen level. A breathing tube may be placed into your lungs during  the procedure. A thin, flexible tube (catheter) will be placed into your bladder to drain urine. A tube may be placed through your nose and into your stomach to drain stomach fluids (nasogastric tube, or NG tube). Your abdomen will be filled with air so it expands. This gives the surgeon more room to operate and makes your organs easier to see. Several small cuts (incisions) will be made in your abdomen. A thin, lighted tube with a tiny camera on the end (laparoscope) will be put through one of the small incisions. The camera on the laparoscope will send a picture to a computer screen in the operating room. This will give the surgeon a good view inside your abdomen. Hollow tubes will be put through the other small incisions in your abdomen. The tools that are needed for the procedure will be put through these tubes. Clamps or staples will be put on both ends of the diseased part of the colon. The part of the intestine between the clamps or  staples will be removed. If possible, the ends of the healthy colon that remain will be stitched (sutured) or stapled together to allow your body to pass waste (stool). Sometimes, the remaining colon cannot be stitched back together. If this is the case, a colostomy will be needed. If you need a colostomy: An opening to the outside of your body (stoma) will be made through your abdomen. The end of your colon will be brought to the opening. It will be stitched to the skin. A bag will be attached to the opening. Stool will drain into this removable bag. The colostomy may be temporary or permanent. The incisions from the colectomy will be closed with sutures or staples. The procedure may vary among health care providers and hospitals. What happens after the procedure? Your blood pressure, heart rate, breathing rate, and blood oxygen level will be monitored until the medicines you were given have worn off. You will receive fluids through an IV tube until your bowels  start to work properly. Once your bowels are working again, you will be given clear liquids first and then solid food as tolerated. You will be given medicines to control your pain and nausea, if needed. Do not drive for 24 hours if you were given a sedative. This information is not intended to replace advice given to you by your health care provider. Make sure you discuss any questions you have with your health care provider. Document Released: 09/27/2002 Document Revised: 04/07/2016 Document Reviewed: 04/07/2016 Elsevier Interactive Patient Education  2018 [employer].     Laparoscopic Colectomy, Care After This sheet gives you information about how to care for yourself after your procedure. Your health care provider may also give you more specific instructions. If you have problems or questions, contact your health care provider. What can I expect after the procedure? After your procedure, it is common to have the following: Pain in your abdomen, especially in the incision areas. You will be given medicine to control the pain. Tiredness. This is a normal part of the recovery process. Your energy level will return to normal over the next several weeks. Changes in your bowel movements, such as constipation or needing to go more often. Talk with your health care provider about how to manage this.  Follow these instructions at home: Medicines Take over-the-counter and prescription medicines only as told by your health care provider. Do not drive or use heavy machinery while taking prescription pain medicine. Do not drink alcohol while taking prescription pain medicine. If you were prescribed an antibiotic medicine, use it as told by your health care provider. Do not stop using the antibiotic even if you start to feel better. Incision care Follow instructions from your health care provider about how to take care of your incision areas. Make sure you: Keep your incisions clean and  dry. Wash your hands with soap and water before and after applying medicine to the areas, and before and after changing your bandage (dressing). If soap and water are not available, use hand sanitizer. Change your dressing as told by your health care provider. Leave stitches (sutures), skin glue, or adhesive strips in place. These skin closures may need to stay in place for 2 weeks or longer. If adhesive strip edges start to loosen and curl up, you may trim the loose edges. Do not remove adhesive strips completely unless your health care provider tells you to do that. Do not wear tight clothing over the incisions. Tight clothing may rub and  irritate the incision areas, which may cause the incisions to open. Do not take baths, swim, or use a hot tub until your health care provider approves. Ask your health care provider if you can take showers. You may only be allowed to take sponge baths for bathing. Check your incision area every day for signs of infection. Check for: More redness, swelling, or pain. More fluid or blood. Warmth. Pus or a bad smell. Activity Avoid lifting anything that is heavier than 10 lb (4.5 kg) for 2 weeks or until your health care provider says it is okay. You may resume normal activities as told by your health care provider. Ask your health care provider what activities are safe for you. Take rest breaks during the day as needed. Eating and drinking Follow instructions from your health care provider about what you can eat after surgery. To prevent or treat constipation while you are taking prescription pain medicine, your health care provider may recommend that you: Drink enough fluid to keep your urine clear or pale yellow. Take over-the-counter or prescription medicines. Eat foods that are high in fiber, such as fresh fruits and vegetables, whole grains, and beans. Limit foods that are high in fat and processed sugars, such as fried and sweet foods. General  instructions Ask your health care provider when you will need an appointment to get your sutures or staples removed. Keep all follow-up visits as told by your health care provider. This is important. Contact a health care provider if: You have more redness, swelling, or pain around your incisions. You have more fluid or blood coming from the incisions. Your incisions feel warm to the touch. You have pus or a bad smell coming from your incisions or your dressing. You have a fever. You have an incision that breaks open (edges not staying together) after sutures or staples have been removed. Get help right away if: You develop a rash. You have chest pain or difficulty breathing. You have pain or swelling in your legs. You feel light-headed or you faint. Your abdomen swells (becomes distended). You have nausea or vomiting. You have blood in your stool (feces). This information is not intended to replace advice given to you by your health care provider. Make sure you discuss any questions you have with your health care provider. Document Released: 01/24/2005 Document Revised: 04/07/2016 Document Reviewed: 04/07/2016 Elsevier Interactive Patient Education  [employer].

## 2023-10-21 NOTE — Telephone Encounter (Signed)
 Per secure chat from Dr.Pabon and Dr.Yu. The CT scheduled in June 2025 needs to be moved to this week so Dr.Pabon can do the surgery for pt ASAP.  CT moved to 10/22/23. I called and left vm for pt on both numbers listed in pt chart about new date/time/location of CT

## 2023-10-21 NOTE — Progress Notes (Signed)
 Outpatient Surgical Follow Up  10/21/2023  Kristin Miller is an 88 y.o. female.   Chief Complaint  Patient presents with   Follow-up    Colon Adenocarcinoma     HPI: Kristin Miller is a very pleasant 88 year old female status post sigmoid colectomy and abdominal pelvic wall resection for colon cancer  07/2022 .t4bn0 , dMMR [loss of PMS2] -  Negative margins.  She completed 5 weeks of adjuvant XRT, She is doing well considering , she has gained weight.  She is tolerating diet she is ambulating and her colostomy is working well.  No fevers no chills.  She declined adjuvant chemo.  She has noticed a left flank bulge.  No pain. Eating well and her colostomy is working well. She Did have a recent CT that I personally reviewed showing no evidence of metastatic disease.  A left flank hernia without evidence of complications some loss of domain. HAd A recent colonoscopy that I have personally reviewed showing evidence of a malignant lesion within the colostomy portion. She went to [facility] abd wall Lexicographer and given malignancy refer back to [place]. I have d/w Dr Cathie Hoops in detail, we will obtain updted CT C/A/P. She will need segmental vs total colectomy. We d/w the pt and husband about the options. Doing subtotal colectomy may be very significant on her and sghe might not be able to tolerated it. Another alternative is partial colectomy robotic assisted w colostomy. I do think this is the safest options. Given abd wall radiation I would not do hernia repair and will tackle this at a later point. They are in agreement.      Past Medical History:  Diagnosis Date   Allergy    Anemia    H/O AS A CHILD   Arthritis    Back pain    Breast cancer of upper-outer quadrant of right female breast (HCC) 06/12/2015   4 millimeter, T1a, N0; ER 90%, PR 0%, HER-2/neu not overexpressed.  Declined radiation therapy.   Collar bone fracture 05/28/2013   Fractured Collar bone on left   Dyspnea    Dysrhythmia    h/o  heart skipping beat years ago   Frequent headaches    h/o migraines   Hypertension    Osteopenia 04/2018   Bone density without significant interval change since 2017.   Pneumonia     Past Surgical History:  Procedure Laterality Date   ABDOMINAL HYSTERECTOMY  1970   Partial   BREAST BIOPSY Right 05/28/2015   INVASIVE MAMMARY CARCINOMA    BREAST BIOPSY Right 06/12/2015   Wide excision, sentinel lymph node biopsy.   BREAST EXCISIONAL BIOPSY Right    BREAST LUMPECTOMY WITH AXILLARY LYMPH NODE DISSECTION Right 06/12/2015   4 mm, T1a,N0 withDCIS with clear margins. ER: 90%, PR 0%; Her 2 neu not overexpressed.    CATARACT EXTRACTION W/PHACO Left 02/03/2017   Procedure: CATARACT EXTRACTION PHACO AND INTRAOCULAR LENS PLACEMENT (IOC);  Surgeon: Galen Manila, MD;  Location: [facility] ORS;  Service: Ophthalmology;  Laterality: Left;  [place]  00:28 AP% 16.3 CDE 4.68 Fluid pack lot # [id number]   CATARACT EXTRACTION W/PHACO Right 03/01/2019   Procedure: CATARACT EXTRACTION PHACO AND INTRAOCULAR LENS PLACEMENT (IOC)  RIGHT;  Surgeon: Galen Manila, MD;  Location: [facility];  Service: Ophthalmology;  Laterality: Right;   COLONOSCOPY     COLONOSCOPY WITH PROPOFOL N/A 06/27/2022   Procedure: COLONOSCOPY WITH PROPOFOL;  Surgeon: Midge Minium, MD;  Location: [name];  Service: Endoscopy;  Laterality: N/A;  COLONOSCOPY WITH PROPOFOL N/A 10/06/2023   Procedure: COLONOSCOPY WITH PROPOFOL;  Surgeon: Midge Minium, MD;  Location: [facility] END[facility]COPY;  Service: Endoscopy;  Laterality: N/A;   ESOPHAGOGASTRODUODENOSCOPY (EGD) WITH PROPOFOL N/A 06/27/2022   Procedure: ESOPHAGOGASTRODUODENOSCOPY (EGD) WITH PROPOFOL;  Surgeon: Midge Minium, MD;  Location: [facility];  Service: Endoscopy;  Laterality: N/A;   PARTIAL COLECTOMY N/A 07/29/2022   Procedure: PARTIAL COLECTOMY, open left, RNFA to assist;  Surgeon: Leafy Ro, MD;  Location: [facility] OR[facility];  Service: General;  Laterality: N/A;    POLYPECTOMY  10/06/2023   Procedure: POLYPECTOMY;  Surgeon: Midge Minium, MD;  Location: [facility] ENDOSCOPY;  Service: Endoscopy;;    Family History  Problem Relation Age of Onset   Mental illness Mother        Dementia   Stroke Father    Lung cancer Brother    Breast cancer Other        dx under 48   Brain cancer Granddaughter 12       glioblastoma    Social History:  reports that she has never smoked. She has never been exposed to tobacco smoke. She has never used smokeless tobacco. She reports that she does not drink alcohol and does not use drugs.  Allergies:  Allergies  Allergen Reactions   Combigan [Brimonidine Tartrate-Timolol]     Redness, burning, irritation    Lisinopril Cough   Other Rash    SUTURES-(PARTIAL HYSTERECTOMY)    Medications reviewed.    ROS Full ROS performed and is otherwise negative other than what is stated in HPI   BP (!) 159/84   Pulse 71   Temp 97.9 F (36.6 C) (Oral)   Ht 5' (1.524 m)   Wt 135 lb (61.2 kg)   SpO2 98%   BMI 26.37 kg/m   Physical Exam  Physical Exam Vitals and nursing note reviewed. Exam conducted with a chaperone present.  Constitutional:      General: She is not in acute distress.    Appearance: Normal appearance. She is normal weight.  Cardiovascular:     Rate and Rhythm: Normal rate and regular rhythm.     Heart sounds: No murmur heard.    No friction rub.  Pulmonary:     Effort: Pulmonary effort is normal. No respiratory distress.     Breath sounds: Normal breath sounds. No stridor. No wheezing or rhonchi.  Abdominal:     General: Abdomen is flat. There is no distension.     Palpations: Abdomen is soft. There is no mass.     Tenderness: There is no abdominal tenderness.     Hernia: No hernia is present.     Comments: Ostomy in place working and widely patent.  I have removed the appliance and I cannot see a mass within the mucosa,.  Midline scar healed . There is a good size left flank hernia defect w some  loss of domain Musculoskeletal:        General: Normal range of motion.     Cervical back: Normal range of motion and neck supple. No rigidity or tenderness.  Skin:    General: Skin is warm and dry.     Capillary Refill: Capillary refill takes less than 2 seconds.  Neurological:     General: No focal deficit present.     Mental Status: She is alert and oriented to person, place, and time.  Psychiatric:        Mood and Affect: Mood normal.  Behavior: Behavior normal.        Thought Content: Thought content normal.        Judgment: Judgment normal   Assessment/Plan: 88 year old female with synchronous adenocarcinoma.  Prior adenocarcinoma year ago was at T4 with invasion into the abdominal wall.  Margins were negative. This left a flank hernia.  Certainly she has a significant major issue including a synchronous new cancer in addition to as significant defect to the left of the abdominal wall.  T his certainly poses significant challenges.  I do think that colectomy certainly indicated given the  nature of her colon cancer.  I have discussed this with her in detail.    I have also discussed this case with Dr. Cathie Hoops the oncologist and likely require additional imaging.   We will tentatively plan for partial colectomy robotic assisted and colostomy. Procedure d/w the pt in detail Risks, benefits and possible complications including but not limited to bleeding, infection, intra-abdominal injuries, She understand and wishes to proceed. Please note I spent greater than 45 minutes in this encounter including extensive review of medical records, personally reviewing imaging studies, pathology report, counseling the patient and the husband.  sHE IS Very appreciative     Sterling Big, MD [facility] General Surgeon

## 2023-10-22 ENCOUNTER — Telehealth: Payer: Self-pay | Admitting: Surgery

## 2023-10-22 ENCOUNTER — Ambulatory Visit
Admission: RE | Admit: 2023-10-22 | Discharge: 2023-10-22 | Disposition: A | Discharge status: Home / Self Care | Source: Ambulatory Visit | Attending: Oncology | Admitting: Oncology

## 2023-10-22 DIAGNOSIS — C186 Malignant neoplasm of descending colon: Secondary | ICD-10-CM | POA: Insufficient documentation

## 2023-10-22 DIAGNOSIS — K7689 Other specified diseases of liver: Secondary | ICD-10-CM | POA: Diagnosis not present

## 2023-10-22 DIAGNOSIS — C189 Malignant neoplasm of colon, unspecified: Secondary | ICD-10-CM | POA: Diagnosis not present

## 2023-10-22 DIAGNOSIS — K449 Diaphragmatic hernia 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 gangrene: Secondary | ICD-10-CM | POA: Diagnosis not present

## 2023-10-22 DIAGNOSIS — Z933 Colostomy status: Secondary | ICD-10-CM | POA: Diagnosis not present

## 2023-10-22 MED ORDER — BARIUM SULFATE 2 % PO SUSP
450.0000 mL | ORAL | Status: AC
  Administered 2023-10-22 (×2): 450 mL via ORAL

## 2023-10-22 MED ORDER — IOHEXOL 300 MG/ML  SOLN
85.0000 mL | Freq: Once | INTRAMUSCULAR | Status: AC | PRN
  Administered 2023-10-22: 85 mL via INTRAVENOUS

## 2023-10-22 NOTE — Telephone Encounter (Signed)
 Spoke to pt, she is aware of plan and will be headed to CT in a few minutes

## 2023-10-22 NOTE — Telephone Encounter (Signed)
 Patient has been advised of Pre-Admission date/time, and Surgery date at [facility].  Surgery Date: 10/27/23 Preadmission Testing Date: Patient informed that Preadmit will call for testing date.  Patient given information regarding surgery.    Patient has been made aware to call [phone], between 1-3:00pm the day before surgery, to find out what time to arrive for surgery.

## 2023-10-23 ENCOUNTER — Encounter
Admission: RE | Admit: 2023-10-23 | Discharge: 2023-10-23 | Disposition: A | Discharge status: Home / Self Care | Source: Ambulatory Visit | Attending: Surgery | Admitting: Surgery

## 2023-10-23 ENCOUNTER — Other Ambulatory Visit

## 2023-10-23 ENCOUNTER — Other Ambulatory Visit: Payer: Self-pay

## 2023-10-23 VITALS — BP 116/78 | HR 87 | Resp 16 | Ht 60.0 in

## 2023-10-23 DIAGNOSIS — I10 Essential (primary) hypertension: Secondary | ICD-10-CM | POA: Insufficient documentation

## 2023-10-23 DIAGNOSIS — Z01818 Encounter for other preprocedural examination: Secondary | ICD-10-CM | POA: Diagnosis present

## 2023-10-23 DIAGNOSIS — Z01810 Encounter for preprocedural cardiovascular examination: Secondary | ICD-10-CM | POA: Diagnosis not present

## 2023-10-23 NOTE — Patient Instructions (Addendum)
 Your procedure is scheduled on: Tuesday 10/27/23 To find out your arrival time, please call [phone] between 1PM - 3PM on:   Monday 10/26/23 Report to the Registration Desk on the 1st floor of the [facility]. Free Valet parking is available.  If your arrival time is 6:00 am, do not arrive before that time as the [facility] entrance doors do not open until 6:00 am.  REMEMBER: Instructions that are not followed completely may result in serious medical risk, up to and including death; or upon the discretion of your surgeon and anesthesiologist your surgery may need to be rescheduled.  Clear liquids only starting Monday 10/26/23 Bowel prep as instructed  Continue to take Tylenol if needed for pain up until the day of surgery.  Stop ANY OVER THE COUNTER supplements and vitamins TODAY 10/23/23 until after surgery.  Continue taking all prescribed medications.   TAKE ONLY THESE MEDICATIONS THE MORNING OF SURGERY WITH A SIP OF WATER:  amLODipine (NORVASC) 5 MG tablet   No Alcohol for 24 hours before or after surgery.  No Smoking including e-cigarettes for 24 hours before surgery.  No chewable tobacco products for at least 6 hours before surgery.  No nicotine patches on the day of surgery.  Do not use any "recreational" drugs for at least a week (preferably 2 weeks) before your surgery.  Please be advised that the combination of cocaine and anesthesia may have negative outcomes, up to and including death. If you test positive for cocaine, your surgery will be cancelled.  On the morning of surgery brush your teeth with toothpaste and water, you may rinse your mouth with mouthwash if you wish. Do not swallow any toothpaste or mouthwash.  Use CHG Soap or wipes as directed on instruction sheet.  Do not wear lotions, powders, or perfumes.   Do not shave body hair from the neck down 48 hours before surgery.  Wear comfortable clothing (specific to your surgery type) to the hospital.  Do  not wear jewelry, make-up, hairpins, clips or nail polish.  For welded (permanent) jewelry: bracelets, anklets, waist bands, etc.  Please have this removed prior to surgery.  If it is not removed, there is a chance that hospital personnel will need to cut it off on the day of surgery. Contact lenses, hearing aids and dentures may not be worn into surgery.  Do not bring valuables to the hospital. [facility] is not responsible for any missing/lost belongings or valuables.   Notify your doctor if there is any change in your medical condition (cold, fever, infection).  If you are being discharged the day of surgery, you will not be allowed to drive home. You will need a responsible individual to drive you home and stay with you for 24 hours after surgery.   If you are taking public transportation, you will need to have a responsible individual with you.  If you are being admitted to the hospital overnight, leave your suitcase in the car. After surgery it may be brought to your room.  In case of increased patient census, it may be necessary for you, the patient, to continue your postoperative care in the Same Day Surgery department.  After surgery, you can help prevent lung complications by doing breathing exercises.  Take deep breaths and cough every 1-2 hours. Your doctor may order a device called an Incentive Spirometer to help you take deep breaths. When coughing or sneezing, hold a pillow firmly against your incision with both hands. This  is called "splinting." Doing this helps protect your incision. It also decreases belly discomfort.  Surgery Visitation Policy:  Patients undergoing a surgery or procedure may have two family members or support persons with them as long as the person is not COVID-19 positive or experiencing its symptoms.   Inpatient Visitation:    Visiting hours are 7 a.m. to 8 p.m. Up to four visitors are allowed at one time in a patient room. The visitors may rotate  out with other people during the day. One designated support person (adult) may remain overnight.  Please call the Pre-admissions Testing Dept. at [phone] if you have any questions about these instructions.     Preparing for Surgery with CHLORHEXIDINE GLUCONATE (CHG) Soap  Chlorhexidine Gluconate (CHG) Soap  o An antiseptic cleaner that kills germs and bonds with the skin to continue killing germs even after washing  o Used for showering the night before surgery and morning of surgery  Before surgery, you can play an important role by reducing the number of germs on your skin.  CHG (Chlorhexidine gluconate) soap is an antiseptic cleanser which kills germs and bonds with the skin to continue killing germs even after washing.  Please do not use if you have an allergy to CHG or antibacterial soaps. If your skin becomes reddened/irritated stop using the CHG.  1. Shower the NIGHT BEFORE SURGERY and the MORNING OF SURGERY with CHG soap.  2. If you choose to wash your hair, wash your hair first as usual with your normal shampoo.  3. After shampooing, rinse your hair and body thoroughly to remove the shampoo.  4. Use CHG as you would any other liquid soap. You can apply CHG directly to the skin and wash gently with a scrungie or a clean washcloth.  5. Apply the CHG soap to your body only from the neck down. Do not use on open wounds or open sores. Avoid contact with your eyes, ears, mouth, and genitals (private parts). Wash face and genitals (private parts) with your normal soap.  6. Wash thoroughly, paying special attention to the area where your surgery will be performed.  7. Thoroughly rinse your body with warm water.  8. Do not shower/wash with your normal soap after using and rinsing off the CHG soap.  9. Pat yourself dry with a clean towel.  10. Wear clean pajamas to bed the night before surgery.  12. Place clean sheets on your bed the night of your first shower and do  not sleep with pets.  13. Shower again with the CHG soap on the day of surgery prior to arriving at the hospital.  14. Do not apply any deodorants/lotions/powders.  15. Please wear clean clothes to the hospital.

## 2023-10-23 NOTE — Consult Note (Addendum)
 WOC Nurse requested for preoperative stoma site marking  Discussed surgical procedure and stoma creation with patient. Explained role of the WOC nurse team. Pt is familiar with ostomy care since she currently has a colostomy and is independent with pouch application and emptying. Pouch is to the left of the abd, below the umbilicus and is intact with good seal.  Examined patient sitting, and standing in order to place the marking in the patient's visual field, away from any creases or abdominal contour issues and within the rectus muscle.  Attempted to mark below the patient's belt line.   Marked for ileostomy in the RLQ  __6__cm to the right of the umbilicus and  __8__ cm below the umbilicus.  There is a significant crease which occurs lower on the abd when she leans forward which should be avoided if possible.  Patient's abdomen cleansed with CHG wipes at site markings, allowed to air dry prior to marking. Covered mark with thin film transparent dressing to preserve mark until date of surgery. Provided with marking pen and told to re-color in if the mark begins to fade prior to surgery.  WOC Nurse team will follow up with patient after surgery for continued ostomy care and teaching if she has an ostomy revision performed.   Thank-you,  Cammie Mcgee MSN, RN, CWOCN, [place], CNS [phone]

## 2023-10-27 ENCOUNTER — Inpatient Hospital Stay: Admitting: Certified Registered"

## 2023-10-27 ENCOUNTER — Inpatient Hospital Stay
Admission: RE | Admit: 2023-10-27 | Discharge: 2023-10-29 | DRG: 331 | Disposition: A | Discharge status: Home / Self Care | Attending: Surgery | Admitting: Surgery

## 2023-10-27 ENCOUNTER — Encounter: Payer: Self-pay | Admitting: Surgery

## 2023-10-27 ENCOUNTER — Other Ambulatory Visit: Payer: Self-pay

## 2023-10-27 ENCOUNTER — Encounter
Admission: RE | Disposition: A | Discharge status: Home / Self Care | Payer: Self-pay | Source: Home / Self Care | Attending: Surgery

## 2023-10-27 DIAGNOSIS — Z801 Family history of malignant neoplasm of trachea, bronchus and lung: Secondary | ICD-10-CM | POA: Diagnosis not present

## 2023-10-27 DIAGNOSIS — Z818 Family history of other mental and behavioral disorders: Secondary | ICD-10-CM

## 2023-10-27 DIAGNOSIS — Z823 Family history of stroke: Secondary | ICD-10-CM

## 2023-10-27 DIAGNOSIS — Z803 Family history of malignant neoplasm of breast: Secondary | ICD-10-CM

## 2023-10-27 DIAGNOSIS — C189 Malignant neoplasm of colon, unspecified: Secondary | ICD-10-CM | POA: Diagnosis not present

## 2023-10-27 DIAGNOSIS — Z8701 Personal history of pneumonia (recurrent): Secondary | ICD-10-CM | POA: Diagnosis not present

## 2023-10-27 DIAGNOSIS — Z1722 Progesterone receptor negative status: Secondary | ICD-10-CM

## 2023-10-27 DIAGNOSIS — Z808 Family history of malignant neoplasm of other organs or systems: Secondary | ICD-10-CM | POA: Diagnosis not present

## 2023-10-27 DIAGNOSIS — K660 Peritoneal adhesions (postprocedural) (postinfection): Secondary | ICD-10-CM | POA: Diagnosis not present

## 2023-10-27 DIAGNOSIS — Z853 Personal history of malignant neoplasm of breast: Secondary | ICD-10-CM

## 2023-10-27 DIAGNOSIS — Z933 Colostomy status: Secondary | ICD-10-CM

## 2023-10-27 DIAGNOSIS — C186 Malignant neoplasm of descending colon: Secondary | ICD-10-CM | POA: Diagnosis not present

## 2023-10-27 DIAGNOSIS — Z90710 Acquired absence of both cervix and uterus: Secondary | ICD-10-CM | POA: Diagnosis not present

## 2023-10-27 DIAGNOSIS — K654 Sclerosing mesenteritis: Secondary | ICD-10-CM | POA: Diagnosis not present

## 2023-10-27 DIAGNOSIS — Z170 Estrogen receptor positive status [ER+]: Secondary | ICD-10-CM | POA: Diagnosis not present

## 2023-10-27 DIAGNOSIS — M8580 Other specified disorders of bone density and structure, unspecified site: Secondary | ICD-10-CM | POA: Diagnosis present

## 2023-10-27 DIAGNOSIS — Z888 Allergy status to other drugs, medicaments and biological substances status: Secondary | ICD-10-CM | POA: Diagnosis not present

## 2023-10-27 DIAGNOSIS — I10 Essential (primary) hypertension: Secondary | ICD-10-CM | POA: Diagnosis not present

## 2023-10-27 DIAGNOSIS — K435 Parastomal hernia 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  gangrene: Secondary | ICD-10-CM

## 2023-10-27 LAB — CREATININE, SERUM
Creatinine, Ser: 1.01 mg/dL — ABNORMAL HIGH (ref 0.44–1.00)
GFR, Estimated: 55 mL/min — ABNORMAL LOW (ref >60)

## 2023-10-27 SURGERY — COLECTOMY, PARTIAL, ROBOT-ASSISTED, LAPAROSCOPIC
Anesthesia: General

## 2023-10-27 MED ORDER — ORAL CARE MOUTH RINSE: 15.0000 mL | Freq: Once | OROMUCOSAL | Status: AC

## 2023-10-27 MED ORDER — FENTANYL CITRATE (PF) 100 MCG/2ML IJ SOLN
INTRAMUSCULAR | Status: DC | PRN
  Administered 2023-10-27: 50 ug via INTRAVENOUS

## 2023-10-27 MED ORDER — ACETAMINOPHEN 500 MG PO TABS
1000.0000 mg | ORAL_TABLET | ORAL | Status: AC
  Administered 2023-10-27: 1000 mg via ORAL

## 2023-10-27 MED ORDER — BUPIVACAINE-EPINEPHRINE 0.25% -1:200000 IJ SOLN
INTRAMUSCULAR | Status: DC | PRN
  Administered 2023-10-27: 50 mL via INTRAMUSCULAR

## 2023-10-27 MED ORDER — OXYCODONE HCL 5 MG PO TABS
5.0000 mg | ORAL_TABLET | Freq: Once | ORAL | Status: AC | PRN
  Administered 2023-10-27: 5 mg via ORAL

## 2023-10-27 MED ORDER — LACTATED RINGERS IV SOLN: INTRAVENOUS | Status: DC

## 2023-10-27 MED ORDER — CHLORHEXIDINE GLUCONATE CLOTH 2 % EX PADS
6.0000 | MEDICATED_PAD | Freq: Once | CUTANEOUS | Status: AC
  Administered 2023-10-27: 6 via TOPICAL

## 2023-10-27 MED ORDER — DIPHENHYDRAMINE HCL 50 MG/ML IJ SOLN
12.5000 mg | Freq: Four times a day (QID) | INTRAMUSCULAR | Status: DC | PRN
  Administered 2023-10-27: 12.5 mg via INTRAVENOUS
  Filled 2023-10-27: qty 1

## 2023-10-27 MED ORDER — SODIUM CHLORIDE 0.9 % IV SOLN: INTRAVENOUS | Status: DC | PRN

## 2023-10-27 MED ORDER — PROCHLORPERAZINE EDISYLATE 10 MG/2ML IJ SOLN: 5.0000 mg | Freq: Four times a day (QID) | INTRAMUSCULAR | Status: DC | PRN

## 2023-10-27 MED ORDER — CHLORHEXIDINE GLUCONATE 0.12 % MT SOLN
15.0000 mL | Freq: Once | OROMUCOSAL | Status: AC
  Administered 2023-10-27: 15 mL via OROMUCOSAL

## 2023-10-27 MED ORDER — LIDOCAINE HCL (CARDIAC) PF 100 MG/5ML IV SOSY
PREFILLED_SYRINGE | INTRAVENOUS | Status: DC | PRN
  Administered 2023-10-27: 100 mg via INTRAVENOUS

## 2023-10-27 MED ORDER — SODIUM CHLORIDE (PF) 0.9 % IJ SOLN
INTRAMUSCULAR | Status: AC
  Filled 2023-10-27: qty 50

## 2023-10-27 MED ORDER — ENOXAPARIN SODIUM 40 MG/0.4ML IJ SOSY
40.0000 mg | PREFILLED_SYRINGE | INTRAMUSCULAR | Status: DC
  Administered 2023-10-27 – 2023-10-28 (×2): 40 mg via SUBCUTANEOUS
  Filled 2023-10-27 (×2): qty 0.4

## 2023-10-27 MED ORDER — SODIUM CHLORIDE 0.9 % IV SOLN
2.0000 g | INTRAVENOUS | Status: AC
  Administered 2023-10-27: 2 g via INTRAVENOUS

## 2023-10-27 MED ORDER — DIPHENHYDRAMINE HCL 12.5 MG/5ML PO ELIX: 12.5000 mg | ORAL_SOLUTION | Freq: Four times a day (QID) | ORAL | Status: DC | PRN

## 2023-10-27 MED ORDER — OXYCODONE HCL 5 MG PO TABS
ORAL_TABLET | ORAL | Status: AC
  Filled 2023-10-27: qty 1

## 2023-10-27 MED ORDER — PHENYLEPHRINE HCL-NACL 20-0.9 MG/250ML-% IV SOLN
INTRAVENOUS | Status: DC | PRN
  Administered 2023-10-27: 25 ug/min via INTRAVENOUS

## 2023-10-27 MED ORDER — SODIUM CHLORIDE 0.9 % IV SOLN
2.0000 g | Freq: Two times a day (BID) | INTRAVENOUS | Status: AC
  Administered 2023-10-27 – 2023-10-29 (×4): 2 g via INTRAVENOUS
  Filled 2023-10-27 (×4): qty 2

## 2023-10-27 MED ORDER — ONDANSETRON HCL 4 MG/2ML IJ SOLN: 4.0000 mg | Freq: Four times a day (QID) | INTRAMUSCULAR | Status: DC | PRN

## 2023-10-27 MED ORDER — ONDANSETRON HCL 4 MG/2ML IJ SOLN
INTRAMUSCULAR | Status: DC | PRN
  Administered 2023-10-27 (×2): 4 mg via INTRAVENOUS

## 2023-10-27 MED ORDER — PHENYLEPHRINE 80 MCG/ML (10ML) SYRINGE FOR IV PUSH (FOR BLOOD PRESSURE SUPPORT)
PREFILLED_SYRINGE | INTRAVENOUS | Status: DC | PRN
  Administered 2023-10-27 (×4): 80 ug via INTRAVENOUS

## 2023-10-27 MED ORDER — SUGAMMADEX SODIUM 200 MG/2ML IV SOLN
INTRAVENOUS | Status: DC | PRN
  Administered 2023-10-27: 200 mg via INTRAVENOUS

## 2023-10-27 MED ORDER — OXYCODONE HCL 5 MG/5ML PO SOLN: 5.0000 mg | Freq: Once | ORAL | Status: AC | PRN

## 2023-10-27 MED ORDER — HYDROMORPHONE HCL 1 MG/ML IJ SOLN
INTRAMUSCULAR | Status: DC | PRN
  Administered 2023-10-27: 1 mg via INTRAVENOUS

## 2023-10-27 MED ORDER — HYDRALAZINE HCL 20 MG/ML IJ SOLN: 10.0000 mg | INTRAMUSCULAR | Status: DC | PRN

## 2023-10-27 MED ORDER — SODIUM CHLORIDE 0.9 % IV SOLN
INTRAVENOUS | Status: AC
  Filled 2023-10-27: qty 2

## 2023-10-27 MED ORDER — SPY AGENT GREEN - (INDOCYANINE FOR INJECTION)
INTRAMUSCULAR | Status: DC | PRN
  Administered 2023-10-27: 2 mL via INTRAVENOUS

## 2023-10-27 MED ORDER — PROPOFOL 10 MG/ML IV BOLUS
INTRAVENOUS | Status: DC | PRN
  Administered 2023-10-27: 120 mg via INTRAVENOUS

## 2023-10-27 MED ORDER — GABAPENTIN 300 MG PO CAPS
300.0000 mg | ORAL_CAPSULE | ORAL | Status: AC
  Administered 2023-10-27: 300 mg via ORAL

## 2023-10-27 MED ORDER — DEXAMETHASONE SODIUM PHOSPHATE 10 MG/ML IJ SOLN
INTRAMUSCULAR | Status: DC | PRN
  Administered 2023-10-27: 10 mg via INTRAVENOUS

## 2023-10-27 MED ORDER — ONDANSETRON 4 MG PO TBDP: 4.0000 mg | ORAL_TABLET | Freq: Four times a day (QID) | ORAL | Status: DC | PRN

## 2023-10-27 MED ORDER — PHENYLEPHRINE HCL-NACL 20-0.9 MG/250ML-% IV SOLN: INTRAVENOUS | Status: DC | PRN

## 2023-10-27 MED ORDER — PHENYLEPHRINE HCL-NACL 20-0.9 MG/250ML-% IV SOLN
INTRAVENOUS | Status: AC
  Filled 2023-10-27: qty 250

## 2023-10-27 MED ORDER — ROCURONIUM BROMIDE 100 MG/10ML IV SOLN
INTRAVENOUS | Status: DC | PRN
  Administered 2023-10-27: 20 mg via INTRAVENOUS
  Administered 2023-10-27: 50 mg via INTRAVENOUS

## 2023-10-27 MED ORDER — PROCHLORPERAZINE MALEATE 10 MG PO TABS: 10.0000 mg | ORAL_TABLET | Freq: Four times a day (QID) | ORAL | Status: DC | PRN

## 2023-10-27 MED ORDER — GLYCOPYRROLATE 0.2 MG/ML IJ SOLN
INTRAMUSCULAR | Status: DC | PRN
Start: 2023-10-27 — End: 2023-10-27
  Administered 2023-10-27: .2 mg via INTRAVENOUS

## 2023-10-27 MED ORDER — SODIUM CHLORIDE 0.9 % IV SOLN: INTRAVENOUS | Status: AC

## 2023-10-27 MED ORDER — GABAPENTIN 300 MG PO CAPS
ORAL_CAPSULE | ORAL | Status: AC
  Filled 2023-10-27: qty 1

## 2023-10-27 MED ORDER — MORPHINE SULFATE (PF) 2 MG/ML IV SOLN: 2.0000 mg | INTRAVENOUS | Status: DC | PRN

## 2023-10-27 MED ORDER — ALVIMOPAN 12 MG PO CAPS
12.0000 mg | ORAL_CAPSULE | ORAL | Status: AC
Start: 2023-10-27 — End: 2023-10-27
  Administered 2023-10-27: 12 mg via ORAL

## 2023-10-27 MED ORDER — HEPARIN SODIUM (PORCINE) 5000 UNIT/ML IJ SOLN
5000.0000 [IU] | Freq: Once | INTRAMUSCULAR | Status: AC
  Administered 2023-10-27: 5000 [IU] via SUBCUTANEOUS

## 2023-10-27 MED ORDER — FENTANYL CITRATE (PF) 100 MCG/2ML IJ SOLN
INTRAMUSCULAR | Status: AC
  Filled 2023-10-27: qty 2

## 2023-10-27 MED ORDER — ACETAMINOPHEN 500 MG PO TABS
1000.0000 mg | ORAL_TABLET | Freq: Four times a day (QID) | ORAL | Status: DC
  Administered 2023-10-27 – 2023-10-29 (×6): 1000 mg via ORAL
  Filled 2023-10-27 (×6): qty 2

## 2023-10-27 MED ORDER — CELECOXIB 200 MG PO CAPS
200.0000 mg | ORAL_CAPSULE | ORAL | Status: AC
  Administered 2023-10-27: 200 mg via ORAL

## 2023-10-27 MED ORDER — ACETAMINOPHEN 500 MG PO TABS
ORAL_TABLET | ORAL | Status: AC
  Filled 2023-10-27: qty 2

## 2023-10-27 MED ORDER — CHLORHEXIDINE GLUCONATE 0.12 % MT SOLN
OROMUCOSAL | Status: AC
  Filled 2023-10-27: qty 15

## 2023-10-27 MED ORDER — BUPIVACAINE LIPOSOME 1.3 % IJ SUSP
INTRAMUSCULAR | Status: AC
  Filled 2023-10-27: qty 20

## 2023-10-27 MED ORDER — KETOROLAC TROMETHAMINE 15 MG/ML IJ SOLN
15.0000 mg | Freq: Four times a day (QID) | INTRAMUSCULAR | Status: DC
  Administered 2023-10-27 – 2023-10-29 (×7): 15 mg via INTRAVENOUS
  Filled 2023-10-27 (×7): qty 1

## 2023-10-27 MED ORDER — FENTANYL CITRATE (PF) 100 MCG/2ML IJ SOLN: 25.0000 ug | INTRAMUSCULAR | Status: DC | PRN

## 2023-10-27 MED ORDER — ALVIMOPAN 12 MG PO CAPS
ORAL_CAPSULE | ORAL | Status: AC
  Filled 2023-10-27: qty 1

## 2023-10-27 MED ORDER — LACTATED RINGERS IV SOLN: INTRAVENOUS | Status: DC | PRN

## 2023-10-27 MED ORDER — HEPARIN SODIUM (PORCINE) 5000 UNIT/ML IJ SOLN
INTRAMUSCULAR | Status: AC
  Filled 2023-10-27: qty 1

## 2023-10-27 MED ORDER — BUPIVACAINE-EPINEPHRINE (PF) 0.25% -1:200000 IJ SOLN
INTRAMUSCULAR | Status: AC
  Filled 2023-10-27: qty 30

## 2023-10-27 MED ORDER — PANTOPRAZOLE SODIUM 40 MG IV SOLR
40.0000 mg | Freq: Two times a day (BID) | INTRAVENOUS | Status: DC
  Administered 2023-10-27 – 2023-10-29 (×4): 40 mg via INTRAVENOUS
  Filled 2023-10-27 (×4): qty 10

## 2023-10-27 MED ORDER — CELECOXIB 200 MG PO CAPS
ORAL_CAPSULE | ORAL | Status: AC
  Filled 2023-10-27: qty 1

## 2023-10-27 MED ORDER — HYDROMORPHONE HCL 1 MG/ML IJ SOLN
INTRAMUSCULAR | Status: AC
  Filled 2023-10-27: qty 1

## 2023-10-27 MED ORDER — CHLORHEXIDINE GLUCONATE CLOTH 2 % EX PADS: 6.0000 | MEDICATED_PAD | Freq: Once | CUTANEOUS | Status: DC

## 2023-10-27 SURGICAL SUPPLY — 74 items
BAG LAPAROSCOPIC 12 15 PORT 16 (BASKET) IMPLANT
BAG RETRIEVAL 12/15 (BASKET) ×1 IMPLANT
CANNULA REDUCER 12-8 DVNC XI (CANNULA) ×1 IMPLANT
CLIP LIGATING HEM O LOK PURPLE (MISCELLANEOUS) IMPLANT
CLIP LIGATING HEMO O LOK GREEN (MISCELLANEOUS) IMPLANT
COVER TIP SHEARS 8 DVNC (MISCELLANEOUS) ×1 IMPLANT
DERMABOND ADVANCED .7 DNX12 (GAUZE/BANDAGES/DRESSINGS) ×1 IMPLANT
DRAPE ARM DVNC X/XI (DISPOSABLE) ×4 IMPLANT
DRAPE COLUMN DVNC XI (DISPOSABLE) ×1 IMPLANT
DRAPE SHEET LG 3/4 BI-LAMINATE (DRAPES) ×1 IMPLANT
ELECT BLADE 6.5 EXT (BLADE) ×1 IMPLANT
ELECT REM PT RETURN 9FT ADLT (ELECTROSURGICAL) ×1 IMPLANT
ELECTRODE REM PT RTRN 9FT ADLT (ELECTROSURGICAL) ×1 IMPLANT
FORCEPS BPLR R/ABLATION 8 DVNC (INSTRUMENTS) ×1 IMPLANT
GLOVE BIO SURGEON STRL SZ7 (GLOVE) ×3 IMPLANT
GOWN STRL REUS W/ TWL LRG LVL3 (GOWN DISPOSABLE) ×5 IMPLANT
GRASPER LAPSCPC 5X45 DSP (INSTRUMENTS) ×1 IMPLANT
GRASPER TIP-UP FEN DVNC XI (INSTRUMENTS) ×1 IMPLANT
HANDLE YANKAUER SUCT BULB TIP (MISCELLANEOUS) ×1 IMPLANT
IRRIGATION STRYKERFLOW (MISCELLANEOUS) IMPLANT
IRRIGATOR STRYKERFLOW (MISCELLANEOUS) ×1 IMPLANT
IV NS 1000ML BAXH (IV SOLUTION) IMPLANT
KIT IMAGING PINPOINTPAQ (MISCELLANEOUS) ×1 IMPLANT
KIT OSTOMY DRAINABLE 2.75 STR (WOUND CARE) IMPLANT
KIT PINK PAD W/HEAD ARE REST (MISCELLANEOUS) ×1 IMPLANT
KIT PINK PAD W/HEAD ARM REST (MISCELLANEOUS) ×1 IMPLANT
LABEL OR SOLS (LABEL) ×1 IMPLANT
MANIFOLD NEPTUNE II (INSTRUMENTS) ×1 IMPLANT
NDL DRIVE SUT CUT DVNC (INSTRUMENTS) ×1 IMPLANT
NDL HYPO 22X1.5 SAFETY MO (MISCELLANEOUS) ×1 IMPLANT
NEEDLE DRIVE SUT CUT DVNC (INSTRUMENTS) ×1 IMPLANT
NEEDLE HYPO 22X1.5 SAFETY MO (MISCELLANEOUS) ×1 IMPLANT
NS IRRIG 500ML POUR BTL (IV SOLUTION) ×1 IMPLANT
OBTURATOR OPTICAL STND 8 DVNC (TROCAR) ×1 IMPLANT
OBTURATOR OPTICALSTD 8 DVNC (TROCAR) ×1 IMPLANT
PACK COLON CLEAN CLOSURE (MISCELLANEOUS) ×1 IMPLANT
PACK LAP CHOLECYSTECTOMY (MISCELLANEOUS) ×1 IMPLANT
PORT ACCESS TROCAR AIRSEAL 5 (TROCAR) ×1 IMPLANT
RELOAD STAPLE 60 2.5 WHT DVNC (STAPLE) ×1 IMPLANT
RELOAD STAPLE 60 3.5 BLU DVNC (STAPLE) ×3 IMPLANT
RELOAD STAPLER 2.5X60 WHT DVNC (STAPLE) IMPLANT
RELOAD STAPLER 3.5X60 BLU DVNC (STAPLE) IMPLANT
SCISSORS MNPLR CVD DVNC XI (INSTRUMENTS) ×1 IMPLANT
SEAL UNIV 5-12 XI (MISCELLANEOUS) ×3 IMPLANT
SEALER VESSEL EXT DVNC XI (MISCELLANEOUS) IMPLANT
SET TRI-LUMEN FLTR TB AIRSEAL (TUBING) ×1 IMPLANT
SOL ELECTROSURG ANTI STICK (MISCELLANEOUS) ×1 IMPLANT
SOLUTION ELECTROSURG ANTI STCK (MISCELLANEOUS) ×1 IMPLANT
SPIKE FLUID TRANSFER (MISCELLANEOUS) ×1 IMPLANT
SPONGE T-LAP 18X18 ~~LOC~~+RFID (SPONGE) ×3 IMPLANT
SPONGE T-LAP 4X18 ~~LOC~~+RFID (SPONGE) ×1 IMPLANT
STAPLER 60 SUREFORM DVNC (STAPLE) ×1 IMPLANT
STAPLER PROXIMATE 75MM BLUE (STAPLE) IMPLANT
STAPLER RELOAD 2.5X60 WHT DVNC (STAPLE) IMPLANT
STAPLER RELOAD 3.5X60 BLU DVNC (STAPLE) IMPLANT
SUT ETHIBOND NAB MO 7 #0 18IN (SUTURE) IMPLANT
SUT MNCRL AB 4-0 PS2 18 (SUTURE) ×1 IMPLANT
SUT PDS AB 0 CT1 27 (SUTURE) ×2 IMPLANT
SUT SILK 2 0 SH (SUTURE) ×2 IMPLANT
SUT SILK 2 0SH CR/8 30 (SUTURE) IMPLANT
SUT SILK 2-0 30XBRD TIE 12 (SUTURE) IMPLANT
SUT STRATA 2-0 23CM CT-2 (SUTURE) ×2 IMPLANT
SUT STRATA 3-0 15 PS-2 (SUTURE) ×1 IMPLANT
SUT STRATA 3-0 SH (SUTURE) ×1 IMPLANT
SUT VIC AB 3-0 SH 27X BRD (SUTURE) IMPLANT
SUT VICRYL 0 UR6 27IN ABS (SUTURE) IMPLANT
SYR 20ML LL LF (SYRINGE) ×2 IMPLANT
SYS TROCAR 1.5-3 SLV ABD GEL (ENDOMECHANICALS) ×1 IMPLANT
SYSTEM TROCR 1.5-3 SLV ABD GEL (ENDOMECHANICALS) ×1 IMPLANT
TRAP FLUID SMOKE EVACUATOR (MISCELLANEOUS) ×1 IMPLANT
TRAY FOLEY MTR SLVR 16FR STAT (SET/KITS/TRAYS/PACK) ×1 IMPLANT
TRAY FOLEY SLVR 16FR LF STAT (SET/KITS/TRAYS/PACK) ×1 IMPLANT
TROCAR ADV FIXATION 12X100MM (TROCAR) ×1 IMPLANT
WATER STERILE IRR 500ML POUR (IV SOLUTION) ×1 IMPLANT

## 2023-10-27 NOTE — Op Note (Signed)
 Kristin assisted laparoscopic Left colectomy w end colostomy  ICG to check perfusion of Graft/ Repair parastomal hernia 4 cms reducible Repair enterotomy   Pre-operative Diagnosis: Colon adenocarcinoma, prior colectomy and hx colon ca  Post-operative Diagnosis: same  Procedure:  Kristin assisted laparoscopic Left colectomy w end colostomy  ICG to check perfusion of Graft/ Repair parastomal hernia 4 cms reducible Repair of enterotomy   Surgeon: Sterling Big, Kristin FACS  Anesthesia: Gen. with endotracheal tube  Findings: Palpable descending colon mass about 3 cm proximal to the end colostomy still within the abdominal wall Omental nodule, no evidence of intraperitoneal metastatic disease Difficult Miller due to extensive  and dense adhesions from the SB to abdominal wall, SB to colon and Colon to Abd wall, lysis of adhesions took about 90 minutes orf total operative time Good perfusion of anastomosis confirmed by ICG NO evidence of metastatic disease Large parastomal hernia,   1st Assist: Laqueta Due PA-C ( required for exposure and creation of the colostomy)  Estimated Blood Loss: 25cc       Specimens: Colon    Procedure Details  The patient was seen again in the Holding Room. The benefits, complications, treatment options, and expected outcomes were discussed with the patient. The risks of bleeding, infection, recurrence of symptoms, failure to resolve symptoms, anastomotic leak,bowel injury, any of which could require further surgery  were reviewed with the patient. The likelihood of improving the patient's symptoms with return to their baseline status is good.  The patient and/or family concurred with the proposed plan, giving informed consent.  The patient was taken to Operating Room, identified  and the procedure verified,  A Time Out was held and the above information confirmed.  Prior to the induction of general anesthesia, antibiotic prophylaxis was administered. VTE  prophylaxis was in place. General endotracheal anesthesia was then administered and tolerated well. After the induction, the abdomen I used a 2-0 silk to do place pursestring to close the end colostomy. She was prepped with Chloraprep and draped in the sterile fashion. The patient was positioned in the supine position. Elliptical incision created around the ostomy and electrocautery was used to dissect through subcutaneous tissue.  Large parastomal hernia was encountered.  Were able to dissect the sac and excised it.  Were able to enter the abdominal cavity under direct visualization.  We encountered dense adhesions from the small bowel and the large bowel to the abdominal wall also interloop adhesions.  There were dense.  Bowel seem to be very fragile likely due to prior radiation.  Extensive lysis of adhesions diverticulitis of adhesions was performed with sharp scissors.  There was an area of the small bowel that was completely fused and open continuation of lysis of adhesions enterotomy was encountered.  We were able to repair the enterotomy using multiple 2-0 silk sutures in the standard fashion.  The repair showed no evidence of leaks and the bowel was widely patent. After I was able to complete the lysis of adhesions open I went ahead and placed the mini GelPort in the standard fashion.  Pneumoperitoneum was then created with CO2 and tolerated well without any adverse changes in the patient's vital signs.  Three 8-mm ports were placed under direct vision. All skin incisions  were infiltrated with a local anesthetic agent before making the incision and placing the trocars.   The patient was positioned  in reverse Trendelenburg, robot was brought to the surgical field and docked in the standard fashion.  We made sure  all the instrumentation was kept indirect view at all times and that there were no collision between the arms. I scrubbed out and went to the console.  Omentum was divided from the  transverse colon.  Using the vessel sealer were able to mobilize the splenic flexure fully.  We were also able to further mobilize the transverse colon from the omentum by dividing the omentum and also taking down the hepatic flexure using vessel sealer.  We again encountered significant adhesions from the abdominal wall to the colon and from the omentum to the colon.  These were meticulously taken down with vessel sealer. He also visualized once again our prior small bowel repair without evidence of leaks.  There was no evidence of any other injuries.  Attention then was turned to the medial aspect of the mesentery.  We identified the the duodenum.  Then we develop the extraperitoneal plane until we reach the hepatic flexure.  At this point the vessel sealer was used to dissect the colon from a lateral to medial perspective  Did had to do quite a bit of mobilization to allow the right colon to exit through the prior left lower quadrant colostomy. The middle colic artery was elevated and clipped and divided in the standard fashion.  We made sure that we use ICG before dividing any bowel structures.  We selected a good area within the proximal transverse colon. THere was excellent perfusion of the pedicle of the transverse colon evidence by using ICG .No evidence of any twisting.    At this point I removed the colon via the left lower quadrant  mini port. We undocked the robot and I scrubbed back in.  All the Kristin ports were removed under direct visualization increase our incision in the left lower quadrant to allow the extraction of the specimen.  The transverse colon was divided with a 75 GIA stapler under direct visualization after the specimen was exteriorized.  There was excellent perfusion of the proximal transverse colon.  There was also excellent mobilization and we had plenty of mobility of the colon. The colon stump was brought out from the prior colostomy site. The specimen was sent for  permanent pathology.   At this point time I was able to use liposomal Marcaine to perform an abdominal block and inciision sites.  It was a sizable parastomal hernia and I was able to repair it with multiple interrupted 0 Ethibond sutures.  Decided not to use mesh given multiple risk factors advanced age and nature of the Miller. All the skin incisions were closed with4-0 monocryl and dermabond was applied The ostomy was matured in the standard fashion using multiple 3-0 Vicryl's in a [employer] fashion.  The ostomy was widely patent with great perfusion.  Stoma appliance was placed Please note that Mr. Manus Rudd [facility] assistance was fundamental given the complexity of the Miller.  He was instrumental in exposing, performing lysis of adhesion, closing and performing the ostomy  The patient was then extubated and brought to the recovery room in stable condition. Sponge, lap, and needle counts were correct at closure and at the conclusion of the Miller.               Sterling Big, Kristin, FACS

## 2023-10-27 NOTE — Transfer of Care (Signed)
 Immediate Anesthesia Transfer of Care Note  Patient: Kristin Miller  Procedure(s) Performed: COLECTOMY, PARTIAL, ROBOT-ASSISTED, LAPAROSCOPIC  Patient Location: PACU  Anesthesia Type:General  Level of Consciousness: awake, drowsy, and patient cooperative  Airway & Oxygen Therapy: Patient Spontanous Breathing and Patient connected to face mask oxygen  Post-op Assessment: Report given to RN and Post -op Vital signs reviewed and stable  Post vital signs: Reviewed and stable  Last Vitals:  Vitals Value Taken Time  BP 126/51 10/27/23 1430  Temp    Pulse 81 10/27/23 1433  Resp 14 10/27/23 1433  SpO2 100 % 10/27/23 1433  Vitals shown include unfiled device data.  Last Pain:  Vitals:   10/27/23 1426  TempSrc:   PainSc: 0-No pain         Complications: No notable events documented.

## 2023-10-27 NOTE — Interval H&P Note (Signed)
 History and Physical Interval Note:  10/27/2023 9:51 AM  Kristin Miller  has presented today for surgery, with the diagnosis of Adenocarcinoma of colon.  The various methods of treatment have been discussed with the patient and family. After consideration of risks, benefits and other options for treatment, the patient has consented to  Procedure(s): COLECTOMY, PARTIAL, ROBOT-ASSISTED, LAPAROSCOPIC (N/A) as a surgical intervention.  The patient's history has been reviewed, patient examined, no change in status, stable for surgery.  I have reviewed the patient's chart and labs.  Questions were answered to the patient's satisfaction.     [name] F Khadijatou Borak

## 2023-10-27 NOTE — Anesthesia Procedure Notes (Addendum)
 Procedure Name: Intubation Date/Time: 10/27/2023 10:29 AM  Performed by: Mohammed Kindle, CRNAPre-anesthesia Checklist: Patient identified, Emergency Drugs available, Suction available and Patient being monitored Patient Re-evaluated:Patient Re-evaluated prior to induction Oxygen Delivery Method: Circle system utilized Preoxygenation: Pre-oxygenation with 100% oxygen Induction Type: IV induction Ventilation: Mask ventilation without difficulty Laryngoscope Size: McGrath and 3 Grade View: Grade I Tube type: Oral Tube size: 6.5 mm Number of attempts: 1 Airway Equipment and Method: Stylet Placement Confirmation: ETT inserted through vocal cords under direct vision, positive ETCO2 and breath sounds checked- equal and bilateral Secured at: 21 cm Tube secured with: Tape Dental Injury: Teeth and Oropharynx as per pre-operative assessment

## 2023-10-27 NOTE — Anesthesia Preprocedure Evaluation (Addendum)
 Anesthesia Evaluation  Patient identified by MRN, date of birth, ID band Patient awake    Reviewed: Allergy & Precautions, NPO status , Patient's Chart, lab work & pertinent test results  History of Anesthesia Complications Negative for: history of anesthetic complications  Airway Mallampati: III  TM Distance: <3 FB Neck ROM: full    Dental  (+) Chipped, Poor Dentition, Missing, Upper Dentures   Pulmonary neg pulmonary ROS, neg shortness of breath   Pulmonary exam normal        Cardiovascular Exercise Tolerance: Good hypertension, (-) angina Normal cardiovascular exam(-) dysrhythmias      Neuro/Psych  Headaches  negative psych ROS   GI/Hepatic negative GI ROS, Neg liver ROS,,,  Endo/Other  negative endocrine ROS    Renal/GU negative Renal ROS  negative genitourinary   Musculoskeletal   Abdominal   Peds  Hematology negative hematology ROS (+)   Anesthesia Other Findings Past Medical History: No date: Allergy No date: Anemia     Comment:  H/O AS A CHILD No date: Arthritis No date: Back pain 06/12/2015: Breast cancer of upper-outer quadrant of right female  breast (HCC)     Comment:  4 millimeter, T1a, N0; ER 90%, PR 0%, HER-2/neu not               overexpressed.  Declined radiation therapy. 05/28/2013: Collar bone fracture     Comment:  Fractured Collar bone on left No date: Dyspnea No date: Dysrhythmia     Comment:  h/o heart skipping beat years ago No date: Frequent headaches     Comment:  h/o migraines No date: Hypertension 04/2018: Osteopenia     Comment:  Bone density without significant interval change since               2017. No date: Pneumonia  Past Surgical History: 1970: ABDOMINAL HYSTERECTOMY     Comment:  Partial 05/28/2015: BREAST BIOPSY; Right     Comment:  INVASIVE MAMMARY CARCINOMA  06/12/2015: BREAST BIOPSY; Right     Comment:  Wide excision, sentinel lymph node biopsy. No date:  BREAST EXCISIONAL BIOPSY; Right 06/12/2015: BREAST LUMPECTOMY WITH AXILLARY LYMPH NODE DISSECTION;  Right     Comment:  4 mm, T1a,N0 withDCIS with clear margins. ER: 90%, PR               0%; Her 2 neu not overexpressed.  02/03/2017: CATARACT EXTRACTION W/PHACO; Left     Comment:  Procedure: CATARACT EXTRACTION PHACO AND INTRAOCULAR               LENS PLACEMENT (IOC);  Surgeon: Galen Manila, MD;                Location: [facility] ORS;  Service: Ophthalmology;  Laterality:              Left;  [place]  00:28 AP% 16.3 CDE 4.68 Fluid pack lot #               [id number] 03/01/2019: CATARACT EXTRACTION W/PHACO; Right     Comment:  Procedure: CATARACT EXTRACTION PHACO AND INTRAOCULAR               LENS PLACEMENT (IOC)  RIGHT;  Surgeon: Galen Manila,              MD;  Location: [facility];  Service:               Ophthalmology;  Laterality: Right; No date: COLONOSCOPY 06/27/2022: COLONOSCOPY WITH PROPOFOL; N/A  Comment:  Procedure: COLONOSCOPY WITH PROPOFOL;  Surgeon: Midge Minium, MD;  Location: [facility];  Service:               Endoscopy;  Laterality: N/A; 06/27/2022: ESOPHAGOGASTRODUODENOSCOPY (EGD) WITH PROPOFOL; N/A     Comment:  Procedure: ESOPHAGOGASTRODUODENOSCOPY (EGD) WITH               PROPOFOL;  Surgeon: Midge Minium, MD;  Location: [facility];  Service: Endoscopy;  Laterality: N/A; 07/29/2022: PARTIAL COLECTOMY; N/A     Comment:  Procedure: PARTIAL COLECTOMY, open left, RNFA to assist;              Surgeon: Leafy Ro, MD;  Location: [facility] ORS;                Service: General;  Laterality: N/A;     Reproductive/Obstetrics negative OB ROS                             Anesthesia Physical Anesthesia Plan  ASA: 2  Anesthesia Plan: General   Post-op Pain Management:    Induction: Intravenous  PONV Risk Score and Plan: 4 or greater and Ondansetron and Dexamethasone  Airway Management  Planned: Oral ETT  Additional Equipment:   Intra-op Plan:   Post-operative Plan: Extubation in OR  Informed Consent: I have reviewed the patients History and Physical, chart, labs and discussed the procedure including the risks, benefits and alternatives for the proposed anesthesia with the patient or authorized representative who has indicated his/her understanding and acceptance.     Dental Advisory Given  Plan Discussed with: Anesthesiologist, CRNA and Surgeon  Anesthesia Plan Comments: (Patient consented for risks of anesthesia including but not limited to:  - adverse reactions to medications - damage to eyes, teeth, lips or other oral mucosa - nerve damage due to positioning  - sore throat or hoarseness - Damage to heart, brain, nerves, lungs, other parts of body or loss of life  Patient voiced understanding and assent.)       Anesthesia Quick Evaluation

## 2023-10-27 NOTE — Plan of Care (Signed)
 Received report, family present. Taking fluids, Supine in bed, Denies pain at this time. Moves well in bed, tolerating fluids, denies nausea. Given toothpaste and brush. Notified pharmacy re: missing Lovenox injection. Will send, continue to monitor.

## 2023-10-28 ENCOUNTER — Ambulatory Visit: Admitting: Surgery

## 2023-10-28 LAB — CBC
HCT: 35.4 % — ABNORMAL LOW (ref 36.0–46.0)
Hemoglobin: 11.9 g/dL — ABNORMAL LOW (ref 12.0–15.0)
MCH: 29.1 pg (ref 26.0–34.0)
MCHC: 33.6 g/dL (ref 30.0–36.0)
MCV: 86.6 fL (ref 80.0–100.0)
Platelets: 277 10*3/uL (ref 150–400)
RBC: 4.09 MIL/uL (ref 3.87–5.11)
RDW: 13.2 % (ref 11.5–15.5)
WBC: 16.3 10*3/uL — ABNORMAL HIGH (ref 4.0–10.5)
nRBC: 0 % (ref 0.0–0.2)

## 2023-10-28 LAB — BASIC METABOLIC PANEL WITH GFR
Anion gap: 7 (ref 5–15)
BUN: 20 mg/dL (ref 8–23)
CO2: 21 mmol/L — ABNORMAL LOW (ref 22–32)
Calcium: 7.7 mg/dL — ABNORMAL LOW (ref 8.9–10.3)
Chloride: 105 mmol/L (ref 98–111)
Creatinine, Ser: 0.95 mg/dL (ref 0.44–1.00)
GFR, Estimated: 59 mL/min — ABNORMAL LOW (ref >60)
Glucose, Bld: 114 mg/dL — ABNORMAL HIGH (ref 70–99)
Potassium: 3.6 mmol/L (ref 3.5–5.1)
Sodium: 133 mmol/L — ABNORMAL LOW (ref 135–145)

## 2023-10-28 MED ORDER — ALVIMOPAN 12 MG PO CAPS
12.0000 mg | ORAL_CAPSULE | Freq: Once | ORAL | Status: DC
  Filled 2023-10-28: qty 1

## 2023-10-28 NOTE — Anesthesia Postprocedure Evaluation (Signed)
 Anesthesia Post Note  Patient: Kristin Miller  Procedure(s) Performed: COLECTOMY, PARTIAL, ROBOT-ASSISTED, LAPAROSCOPIC  Patient location during evaluation: PACU Anesthesia Type: General Level of consciousness: awake and alert Pain management: pain level controlled Vital Signs Assessment: post-procedure vital signs reviewed and stable Respiratory status: spontaneous breathing, nonlabored ventilation, respiratory function stable and patient connected to nasal cannula oxygen Cardiovascular status: blood pressure returned to baseline and stable Postop Assessment: no apparent nausea or vomiting Anesthetic complications: no   No notable events documented.   Last Vitals:  Vitals:   10/28/23 0210 10/28/23 0802  BP: 128/68 (!) 121/54  Pulse: 68 69  Resp: 18 18  Temp: 36.9 C 36.8 C  SpO2: 97% 99%    Last Pain:  Vitals:   10/28/23 0802  TempSrc: Oral  PainSc:                  Corinda Gubler

## 2023-10-28 NOTE — TOC Initial Note (Signed)
 Transition of Care [facility]) - Initial/Assessment Note    Patient Details  Name: Kristin Miller MRN: [id number] Date of Birth: 10-01-1935  Transition [facility]) CM/SW Contact:    Chapman Fitch, RN Phone Number: 10/28/2023, 3:41 PM  Clinical Narrative:                  Met with patient and spouse  Patient with new ostomy, however she previously had an ostomy in 2024.   Discussed home health services with patient.  She declines at this time.  She states that she is familiar with the process and has lots and family and friend support.  She is to notify TOC if she changes her mind  Patient states that she has a RW at home         Patient Goals and CMS Choice            Expected Discharge Plan and Services                                              Prior Living Arrangements/Services                       Activities of Daily Living   ADL Screening (condition at time of admission) Independently performs ADLs?: Yes (appropriate for developmental age) Is the patient deaf or have difficulty hearing?: No Does the patient have difficulty seeing, even when wearing glasses/contacts?: No Does the patient have difficulty concentrating, remembering, or making decisions?: No  Permission Sought/Granted                  Emotional Assessment              Admission diagnosis:  Adenocarcinoma of colon (HCC) [C18.9] Colon cancer (HCC) [C18.9] Patient Active Problem List   Diagnosis Date Noted   Parastomal hernia 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 gangrene 10/27/2023   Colon cancer (HCC) 10/27/2023   History of colon cancer 10/06/2023   Neoplasm of digestive system 10/06/2023   Age-related osteoporosis with current pathol fracture of vertebra, sequela 07/21/2023   Compression fracture of body of thoracic vertebra (HCC) 07/20/2023   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   Genetic testing 08/28/2022   Other constipation 08/14/2022   Colostomy  complication (HCC) 08/14/2022   Rectal bleeding 08/04/2022   Gastroesophageal reflux disease with esophagitis 08/04/2022   GI bleeding 08/03/2022   SBO (small bowel obstruction) (HCC) 08/03/2022   Essential hypertension 08/03/2022   S/P partial resection of colon 07/29/2022   Malnutrition (HCC) 07/05/2022   Colon adenocarcinoma (HCC) 07/04/2022   Iron deficiency anemia 06/27/2022   Primary open angle glaucoma (POAG) of both eyes, moderate stage 05/14/2022   BP (high blood pressure) 11/15/2015   OP (osteoporosis) 11/15/2015   Primary cancer of lower-outer quadrant of right breast (HCC) 06/20/2015   Low back pain 08/02/2014   HTN (hypertension) 08/02/2014   Superficial basal cell carcinoma (BCC) of skin of left shoulder 07/31/2014   Abnormal finding on mammography 10/03/2013   Chronic bilateral low back pain without sciatica 03/29/2012   PCP:  Alease Medina, MD Pharmacy:   [facility] [address], [place] - [address] [place] [address] Phone: [phone] Fax: [phone]     Social Drivers of Health (SDOH) Social History: SDOH Screenings   Food  Insecurity: No Food Insecurity (10/28/2023)  Housing: Low Risk  (10/28/2023)  Transportation Needs: No Transportation Needs (10/28/2023)  Utilities: Not At Risk (10/28/2023)  Alcohol Screen: Low Risk  (09/25/2021)  Depression (PHQ2-9): Low Risk  (08/25/2023)  Financial Resource Strain: Low Risk  (01/09/2023)   Received from [facility], [facility]  Physical Activity: Inactive (09/25/2021)  Social Connections: Moderately Integrated (10/28/2023)  Stress: No Stress Concern Present (09/25/2021)  Tobacco Use: Low Risk  (10/27/2023)   SDOH Interventions:     Readmission Risk Interventions     No data to display

## 2023-10-28 NOTE — Plan of Care (Signed)
 Mistaken entry

## 2023-10-28 NOTE — Progress Notes (Signed)
 [facility] SUR[facility]ICAL PROGRESS NOTE  Hospital Day(s): 1.   Post op day(s): 1 Day Post-Op.   Interval History:  Patient seen and examined No acute events or new complaints overnight.  Patient reports she feels good, feels like "her energy is up" Abdominal soreness No fever, chills, nausea, emesis Labs pending this AM She is on FLD Ostomy with significant gas in bag  Vital signs in last 24 hours: [min-max] current  Temp:  [97.1 F (36.2 C)-98.9 F (37.2 C)] 98.4 F (36.9 C) (04/09 0210) Pulse Rate:  [68-101] 68 (04/09 0210) Resp:  [12-18] 18 (04/09 0210) BP: (96-146)/(51-97) 128/68 (04/09 0210) SpO2:  [94 %-99 %] 97 % (04/09 0210) Weight:  [56.7 kg] 56.7 kg (04/08 0927)     Height: 5' (152.4 cm) Weight: 56.7 kg BMI (Calculated): 24.41   Intake/Output last 2 shifts:  04/08 0701 - 04/09 0700 In: 1550 [P.O.:450; I.V.:1000; IV Piggyback:100] Out: 450 [Urine:375; Blood:25]   Physical Exam:  Constitutional: alert, cooperative and no distress  Respiratory: breathing non-labored at rest  Cardiovascular: regular rate and sinus rhythm  Gastrointestinal: soft, non-tender, and non-distended. Colostomy in left abdomen; this is dusky but there is significant gas in the bag, small amount of liquid stool Genitourinary: Foley in place; good UO Integumentary: Laparoscopic incisions are CDI with dermabond, no erythema or drainage   Labs:     Latest Ref Rng & Units 08/25/2023    8:56 AM 07/10/2023    8:01 AM 01/09/2023   11:10 AM  CBC  WBC 3.4 - 10.8 x10E3/uL 8.3  8.1  8.8   Hemoglobin 11.1 - 15.9 g/dL 16.1  09.6  04.5   Hematocrit 34.0 - 46.6 % 42.8  41.9  37.0   Platelets 150 - 450 x10E3/uL 320  314  330       Latest Ref Rng & Units 10/27/2023    7:48 PM 08/25/2023    8:55 AM 07/10/2023    8:01 AM  CMP  Glucose 70 - 99 mg/dL  409  811   BUN 8 - 27 mg/dL  15  18   Creatinine 9.14 - 1.00 mg/dL 7.82  9.56  2.13   Sodium 134 - 144 mmol/L  143  137   Potassium 3.5 - 5.2  mmol/L  3.9  3.6   Chloride 96 - 106 mmol/L  103  100   CO2 20 - 29 mmol/L  24  26   Calcium 8.7 - 10.3 mg/dL  9.1  9.4   Total Protein 6.0 - 8.5 g/dL  6.2  6.8   Total Bilirubin 0.0 - 1.2 mg/dL  0.3  0.5   Alkaline Phos 44 - 121 IU/L  69  60   AST 0 - 40 IU/L  16  18   ALT 0 - 32 IU/L  11  13      Imaging studies: No new pertinent imaging studies   Assessment/Plan: 88 y.o. female 1 Day Post-Op s/p robotic assisted laparoscopic left colectomy with end colostomy, repair enterotomy, and repair of parastomal hernia for adenocarcinoma recurrence at colostomy.   - We can do soft diet later this afternoon  - Follow up labs  - Discontinue foley catheter - Discontinue Entereg  - Complete perioperative ABx  - Monitor abdominal examination   - Monitor ostomy function - engage WOC if needed here - Pain control prn; antiemetics prn - Mobilize  - Discharge Planning: She is doing well, already with ROBF. We will gradually advance diet. Hopefully home  in next 24-48 hours, no rush if needed.   All of the above findings and recommendations were discussed with the patient, patient's family (husband at bedside), and the medical team, and all of patient's and family's questions were answered to their expressed satisfaction.  -- Lynden Oxford, PA-C [facility] 10/28/2023, 7:14 AM M-F: 7am - 4pm

## 2023-10-28 NOTE — Consult Note (Signed)
 WOC consulted for new ostomy site, patient has had ostomy in the past. Will follow up for needs when [facility] nurse back on the [facility] tomorrow.   Bartholomew Ramesh [facility], CNS, [employer] [phone]

## 2023-10-28 NOTE — Progress Notes (Signed)
 Entered room and introduced myself as RN who helps with admissions and discharges. Patient is in bed with husband Delton See at bedside.   Explained to patient medical readiness day is tomorrow (10/29/23) and this means we hope she is stable and ready for discharge at that time. Patient agrees she hopes she is ready. Patient states her husband Delton See can be here to transport patient home when medically ready. Explained this date is tentative and based on clinical status and final discharge determination will be made by her provider. Patient voiced understanding and states she feels "ready and excited" to go home, and has a lot of friends close by who are helpful to her.  At this time, there are no social barriers to discharge include. Patient has clothes to wear home, her husband will have a key to their home, and she has no medications stored in the pharmacy. Patient states she has no DME or oxygen needs, and the wound team will see her tomorrow.

## 2023-10-29 ENCOUNTER — Ambulatory Visit: Payer: Medicare HMO | Admitting: Radiation Oncology

## 2023-10-29 MED ORDER — OXYCODONE HCL 5 MG PO TABS: 5.0000 mg | ORAL_TABLET | ORAL | 0 refills | Status: DC | PRN

## 2023-10-29 NOTE — Plan of Care (Signed)
 Received report. Family present, up to BR with assistance and walker. Denies dizziness. Returned to bed, Assessment completed. Abdomen with ostomy bag, brown liquid stool noted. Tender to touch. Reports had a good day. Taking fluids well. Continue to monitor. Sleeping when checked, rouses easily. Husband present and staying the night. 0300 Sleeping when checked, rouses easily, denies pain Slept 5-6 hrs.

## 2023-10-29 NOTE — Discharge Summary (Signed)
 [facility] SUR[facility]ICAL DISCHARGE SUMMARY  Patient ID: Kristin Miller MRN: [id number] DOB/AGE: 19-Feb-1936 88 y.o.  Admit date: 10/27/2023 Discharge date: 10/29/2023  Discharge Diagnoses Patient Active Problem List   Diagnosis Date Noted   Parastomal hernia 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 gangrene 10/27/2023   Colon cancer (HCC) 10/27/2023   History of colon cancer 10/06/2023    Consultants None   Procedures 10/27/2023:  Robotic assisted laparoscopic left colectomy, creation end colostomy, repair of parastomal hernia   HPI: Kristin Miller is a 88 y.o. female with history of colon cancer s/p left colectomy and end colostomy creation now with synchronous lesion of the colostomy who presents to [facility] for scheduled resection.   Hospital Course: Informed consent was obtained and documented, and patient underwent uneventful robotic assisted laparoscopic left colectomy, creation end colostomy, repair of parastomal hernia  (Dr Everlene Farrier, 10/27/2023).  Post-operatively, patient did remarkably well with immediate ROBF. Advancement of patient's diet and ambulation were well-tolerated. The remainder of patient's hospital course was essentially unremarkable, and discharge planning was initiated accordingly with patient safely able to be discharged home with appropriate discharge instructions, pain control, and outpatient follow-up after all of her questions were answered to her expressed satisfaction.   Discharge Condition: Good   Physical Examination:  Constitutional: alert, cooperative and no distress  Respiratory: breathing non-labored at rest  Cardiovascular: regular rate and sinus rhythm  Gastrointestinal: soft, non-tender, and non-distended. Colostomy in left abdomen; this is dusky but there is significant gas in the bag, small amount of liquid stool Genitourinary: Foley in place; good UO Integumentary: Laparoscopic incisions are CDI with dermabond, no erythema or drainage     Allergies as of 10/29/2023       Reactions   Combigan [brimonidine Tartrate-timolol]    Redness, burning, irritation   Lisinopril Cough   Other Itching, Rash   SUTURES-(PARTIAL HYSTERECTOMY)        Medication List     STOP taking these medications    metroNIDAZOLE 500 MG tablet Commonly known as: Flagyl   neomycin 500 MG tablet Commonly known as: MYCIFRADIN   polyethylene glycol powder 17 GM/SCOOP powder Commonly known as: MiraLax       TAKE these medications    acetaminophen 500 MG tablet Commonly known as: TYLENOL Take 500 mg by mouth every 6 (six) hours as needed for pain.   alendronate 70 MG tablet Commonly known as: FOSAMAX Take 1 tablet (70 mg total) by mouth every 7 (seven) days for 48 doses. Take with a full glass of water on an empty stomach.   amLODipine 5 MG tablet Commonly known as: NORVASC Take 1 tablet (5 mg total) by mouth daily.   CAL MAG ZINC +D3 PO Take 1 tablet by mouth daily.   docusate sodium 100 MG capsule Commonly known as: COLACE Take 100 mg by mouth daily as needed for mild constipation.   latanoprost 0.005 % ophthalmic solution Commonly known as: XALATAN 1 drop at bedtime. Eye doctor both eyes   losartan-hydrochlorothiazide 100-12.5 MG tablet Commonly known as: HYZAAR Take 1 tablet by mouth daily.   oxyCODONE 5 MG immediate release tablet Commonly known as: Oxy IR/ROXICODONE Take 1 tablet (5 mg total) by mouth every 4 (four) hours as needed for severe pain (pain score 7-10).          Follow-up Information     Leafy Ro, MD. Go on 11/09/2023.   Specialty: General Surgery Why: Go to appointment on 04/21 at 1015 AM Contact information: [address] [place] [address] [phone]                  Time spent on discharge management including discussion of hospital course, clinical condition, outpatient instructions, prescriptions, and follow up with the patient and members of  the medical team: >30 minutes  -- Lynden Oxford , PA-C  [facility]  10/29/2023, 8:33 AM [phone] M-F: 7am - 4pm

## 2023-10-29 NOTE — Consult Note (Signed)
 WOC Nurse ostomy consult note Stoma type/location:  LLQ, end colostomy; resite; patient has had ostomy x 1 year  Stomal assessment/size: 2 1/4" round, dark mucous with dark blood, flaccid  mucosal distally; I did demonstrate manipulating this when placing skin barrier  Peristomal assessment: mild contact dermatitis distally from 4-8 o'clock, treated with skin barrier wipe Treatment options for stomal/peristomal skin: 2" skin barrier  Output dark brown stool Ostomy pouching: 2pc. 2 3/4" with 2" barrier ring  Education provided:  Re measured stoma for patient and created new pattern, education on need to re measure in a week or so. Mucosal is dark, explained that this is ok and it will slough away, but not to scrub area. Patient to monitor output and contact surgery if no output in 24 hours or any bright red bleeding.  Demonstrated how to stretch barrier ring to fit new stoma size and place around the stoma, manipulating the looser mucosal at the bottom of the stoma.  Patient is very familiar with pouching, using 2pc, lock and roll closure.   Reminded patient of the outpatient clinic will ask Dr. Everlene Farrier to make a new referral.  Enrolled patient in [facility] DC program: Re registered patient does not have sample kit.   3 extra pouches/barrier rings/skin barrier in the room for DC to home. WOC contact information left and pattern for cutting skin barriers moving forward until stoma sloughs  WOC Nurse will follow along with you for continued support with ostomy teaching and care Kristin Miller [facility] MSN, RN, [place], CNS, [place] [phone]

## 2023-10-29 NOTE — Discharge Instructions (Signed)
In addition to included general post-operative instructions,  Diet: Resume home diet.   Activity: No heavy lifting >20 pounds (children, pets, laundry, garbage) or strenuous activity for 6 weeks, but light activity and walking are encouraged. Do not drive or drink alcohol if taking narcotic pain medications or having pain that might distract from driving.  Wound care: You may shower/get incision wet with soapy water and pat dry (do not rub incisions), but no baths or submerging incision underwater until follow-up.   Medications: Resume all home medications. For mild to moderate pain: acetaminophen (Tylenol) or ibuprofen/naproxen (if no kidney disease). Combining Tylenol with alcohol can substantially increase your risk of causing liver disease. Narcotic pain medications, if prescribed, can be used for severe pain, though may cause nausea, constipation, and drowsiness. Do not combine Tylenol and Percocet (or similar) within a 6 hour period as Percocet (and similar) contain(s) Tylenol. If you do not need the narcotic pain medication, you do not need to fill the prescription.  Call office ([phone] / [phone]) at any time if any questions, worsening pain, fevers/chills, bleeding, drainage from incision site, or other concerns.  

## 2023-10-30 LAB — SURGICAL PATHOLOGY

## 2023-11-02 ENCOUNTER — Telehealth: Payer: Self-pay | Admitting: Surgery

## 2023-11-02 NOTE — Telephone Encounter (Signed)
 D/w pt about pathology results. She is appreciative she is otherwise doing well. Taking po and ostomy working, some color changes on colostomy not unexpected. Advice pt to call back if she has issues

## 2023-11-04 ENCOUNTER — Ambulatory Visit: Admitting: Surgery

## 2023-11-04 ENCOUNTER — Encounter: Payer: Self-pay | Admitting: Surgery

## 2023-11-04 VITALS — BP 128/72 | HR 98 | Temp 98.2°F | Ht 60.0 in | Wt 124.0 lb

## 2023-11-04 DIAGNOSIS — Z08 Encounter for follow-up examination after completed treatment for malignant neoplasm: Secondary | ICD-10-CM

## 2023-11-04 DIAGNOSIS — Z09 Encounter for follow-up examination after completed treatment for conditions other than malignant neoplasm: Secondary | ICD-10-CM

## 2023-11-04 DIAGNOSIS — C189 Malignant neoplasm of colon, unspecified: Secondary | ICD-10-CM

## 2023-11-04 NOTE — Patient Instructions (Signed)
 Do eat a high protein diet to help with healing and recovery.  Follow up here in 2 weeks.   Protein in Different Foods Protein is a nutrient that you get through your diet. Depending on your health, you may need more or less protein in your diet. You should eat a variety of protein foods to make sure that you get all the nutrients you need. Talk with your health care provider about how much protein you need each day. Protein helps your body: Fix and make cells and tissues. Fight infection. Have energy. Grow and develop. What are tips for getting more protein in your diet? Try to replace processed carbohydrates with high-quality protein. Snack on nuts and seeds instead of chips. Replace baked desserts with [place] yogurt. Eat protein foods from both plant and animal sources. Add beans and peas to salads, soups, and side dishes. Include a protein food with each meal and snack. Eat more whole grains. Add powdered milk or protein powder to hot cereals. Add peanut butter to toast or crackers instead of butter. Reading food labels You can find the amount of protein in a food item by looking at the nutrition facts label. Use the total grams listed to help you reach your daily goal. What foods are high in protein?  High-protein foods contain 4 grams (g) or more of protein per serving. They include: Grains Quinoa (cooked) -- 1 cup (185 g) has 8 g of protein. Whole wheat pasta (cooked) -- 1 cup (140 g) has 6 g of protein. Dairy Cottage cheese --  cup (114 g) has 13.4 g of protein. Milk -- 1 cup (237 mL) has 8 g of protein. Cheese (hard) -- 1 oz (28 g) has 7 g of protein. Yogurt, regular -- 6 oz (170 g) has 8 g of protein. Greek yogurt -- 6 oz (200 g) has 18 g protein. Meat Beef, ground sirloin (cooked) -- 3 oz (85 g) has 24 g of protein. Chicken breast, boneless and skinless (cooked) -- 3 oz (85 g) has 25 g of protein. Egg -- 1 egg has 6 g of protein. Fish, filet (cooked) -- 3 oz (85g )  has 21-23 g of protein. Lamb (cooked) -- 3 oz (85 g) has 24 g of protein. Pork tenderloin (cooked) -- 3 oz (85 g) has 23 g of protein. Tuna (canned in water) -- 3 oz (85 g) has 20 g of protein. Plant protein Garbanzo beans (canned or cooked) --  cup (130 g) has 6-7 g of protein. Kidney beans (canned or cooked) --  cup (130 g) has 6-7 g of protein. Nuts (peanuts, pistachios, almonds) -- 1 oz (28 g) has 6 g of protein. Peanut butter -- 1 oz (32 g) has 7-8 g of protein. Pumpkin seeds -- 1 oz (28 g) has 8.5 g of protein. Soybeans (roasted) -- 1 oz (28 g) has 8 g of protein. Soybeans (cooked) --  cup (90 g) has 11 g of protein. Soy milk -- 1 cup (250 mL) has 5-10 g of protein. Soy or vegetable patty -- 1 patty has 11 g of protein. Sunflower seeds -- 1 oz (28 g) has 5.5 g of protein. Buckwheat -- 1 oz (33 g) has 4.3 g of protein. Tofu (firm) --  cup (124 g) has 20 g of protein. Tempeh --  cup (83 g) has 16 g of protein. The items listed above may not be a complete list of foods that are high in protein. Actual amounts of protein  may be different depending on processing. Talk with an expert in healthy eating called a dietitian to learn more. What foods are low in protein?  Low-protein foods contain 3 grams (g) or less of protein per serving. They include: Fruits Fruit or vegetable juice --  cup (125 mL) has 1 g of protein. Vegetables Beets (raw or cooked) --  cup (68 g) has 1.5 g of protein. Broccoli (raw or cooked) --  cup (44 g) has 2 g of protein. Collard greens (raw or cooked) --  cup (42 g) has 2 g of protein. Green beans (raw or cooked) --  cup (83 g) has 1 g of protein. Green peas (canned) --  cup (80 g) has 3.5 g of protein. Potato (baked with skin) -- 1 medium potato (173 g) has 3 g of protein. Spinach (cooked) --  cup (90 g) has 3 g of protein. Squash (cooked) --  cup (90 g) has 1.5 g of protein. Avocado -- 1 cup (146 g) has 2.7 g of protein. Grains Bran cereal --   cup (30 g) has 3 g of protein. Whole wheat bread -- 1 slice has 3 g of protein. Corn (fresh or cooked) --  cup (77 g) has 2 g of protein. Flour tortilla -- One 6-inch (15 cm) tortilla has 2.5 g of protein. Muffins -- 1 small muffin (2 oz or 57 g) has 3 g of protein. Oatmeal (cooked) --  cup (40 g) has 3 g of protein. Brown rice (cooked) --  cup (78 g) has 2.5 g of protein. Dairy Cream cheese -- 1 oz (29 g) has 2 g of protein. Creamer (half-and-half) -- 1 oz (29 mL) has 1 g of protein. Frozen yogurt --  cup (72 g) has 3 g of protein. Sour cream --  cup (75 g) has 2.5 g of protein. The items listed above may not be a complete list of foods that are low in protein. Actual amounts of protein may be different depending on processing. Talk with an expert in healthy eating to learn more. This information is not intended to replace advice given to you by your health care provider. Make sure you discuss any questions you have with your health care provider. Document Revised: 12/01/2022 Document Reviewed: 12/01/2022 Elsevier Patient Education  2024 [employer].

## 2023-11-04 NOTE — Progress Notes (Signed)
 S/p left colectomy and end colostomy Doing well overall Path d/w pt Some discoloration on ostomy, good output, eating ok but weak  PE NAD AVSS Abd: soft, incisions healing well, no infection or peritonitis. Ostomy patent w stool and air. Some dark mucosal discoloration but upon further inspection there is pink mucosa on the sides and on deeper aspect.  A/p Overall doing well d/w her that there might be some mucosal changes but her ostomy is working and she is not toxic, usually mucosa will slough off. No evdiecne  of complications RTC 1-2 weeks

## 2023-11-09 ENCOUNTER — Encounter: Admitting: Surgery

## 2023-11-09 ENCOUNTER — Telehealth: Payer: Self-pay

## 2023-11-09 ENCOUNTER — Encounter: Payer: Self-pay | Admitting: Surgery

## 2023-11-09 ENCOUNTER — Ambulatory Visit (INDEPENDENT_AMBULATORY_CARE_PROVIDER_SITE_OTHER): Admitting: Surgery

## 2023-11-09 ENCOUNTER — Other Ambulatory Visit
Admission: RE | Admit: 2023-11-09 | Discharge: 2023-11-09 | Disposition: A | Discharge status: Home / Self Care | Source: Ambulatory Visit | Attending: Surgery | Admitting: Surgery

## 2023-11-09 ENCOUNTER — Other Ambulatory Visit: Payer: Self-pay

## 2023-11-09 VITALS — BP 117/65 | HR 96 | Temp 98.2°F | Ht 60.0 in | Wt 128.4 lb

## 2023-11-09 DIAGNOSIS — K9400 Colostomy complication, unspecified: Secondary | ICD-10-CM | POA: Diagnosis not present

## 2023-11-09 DIAGNOSIS — R1084 Generalized abdominal pain: Secondary | ICD-10-CM | POA: Insufficient documentation

## 2023-11-09 DIAGNOSIS — C186 Malignant neoplasm of descending colon: Secondary | ICD-10-CM | POA: Diagnosis not present

## 2023-11-09 DIAGNOSIS — Z09 Encounter for follow-up examination after completed treatment for conditions other than malignant neoplasm: Secondary | ICD-10-CM

## 2023-11-09 LAB — BASIC METABOLIC PANEL WITH GFR
Anion gap: 11 (ref 5–15)
BUN: 15 mg/dL (ref 8–23)
CO2: 28 mmol/L (ref 22–32)
Calcium: 8.5 mg/dL — ABNORMAL LOW (ref 8.9–10.3)
Chloride: 98 mmol/L (ref 98–111)
Creatinine, Ser: 0.75 mg/dL (ref 0.44–1.00)
GFR, Estimated: >60 mL/min (ref >60)
Glucose, Bld: 126 mg/dL — ABNORMAL HIGH (ref 70–99)
Potassium: 3.1 mmol/L — ABNORMAL LOW (ref 3.5–5.1)
Sodium: 137 mmol/L (ref 135–145)

## 2023-11-09 LAB — CBC WITH DIFFERENTIAL/PLATELET
Abs Immature Granulocytes: 0.41 10*3/uL — ABNORMAL HIGH (ref 0.00–0.07)
Basophils Absolute: 0.2 10*3/uL — ABNORMAL HIGH (ref 0.0–0.1)
Basophils Relative: 1 %
Eosinophils Absolute: 0.5 10*3/uL (ref 0.0–0.5)
Eosinophils Relative: 3 %
HCT: 31.2 % — ABNORMAL LOW (ref 36.0–46.0)
Hemoglobin: 10.4 g/dL — ABNORMAL LOW (ref 12.0–15.0)
Immature Granulocytes: 2 %
Lymphocytes Relative: 12 %
Lymphs Abs: 2.2 10*3/uL (ref 0.7–4.0)
MCH: 28 pg (ref 26.0–34.0)
MCHC: 33.3 g/dL (ref 30.0–36.0)
MCV: 84.1 fL (ref 80.0–100.0)
Monocytes Absolute: 1.3 10*3/uL — ABNORMAL HIGH (ref 0.1–1.0)
Monocytes Relative: 7 %
Neutro Abs: 14 10*3/uL — ABNORMAL HIGH (ref 1.7–7.7)
Neutrophils Relative %: 75 %
Platelets: 719 10*3/uL — ABNORMAL HIGH (ref 150–400)
RBC: 3.71 MIL/uL — ABNORMAL LOW (ref 3.87–5.11)
RDW: 14.5 % (ref 11.5–15.5)
WBC: 18.6 10*3/uL — ABNORMAL HIGH (ref 4.0–10.5)
nRBC: 0 % (ref 0.0–0.2)

## 2023-11-09 MED ORDER — LOPERAMIDE HCL 2 MG PO CAPS: 2.0000 mg | ORAL_CAPSULE | ORAL | 1 refills | Status: DC | PRN

## 2023-11-09 MED ORDER — OXYCODONE HCL 5 MG PO TABS: 5.0000 mg | ORAL_TABLET | ORAL | 0 refills | Status: DC | PRN

## 2023-11-09 MED ORDER — POTASSIUM CHLORIDE CRYS ER 20 MEQ PO TBCR: 40.0000 meq | EXTENDED_RELEASE_TABLET | Freq: Every day | ORAL | 0 refills | Status: DC

## 2023-11-09 MED ORDER — AMOXICILLIN-POT CLAVULANATE 875-125 MG PO TABS: 1.0000 | ORAL_TABLET | Freq: Two times a day (BID) | ORAL | 0 refills | Status: DC

## 2023-11-09 NOTE — Patient Instructions (Addendum)
 We are sending in a prescription for antibiotics for you to your pharmacy.  You may take Imodium  2 mg three times a day.  We will get you scheduled for a CT to better look at what may be going on.   We want you to have lab work done today. Go to [facility] entrance for this. No appointment is needed.  We will have you follow up here Monday.   You are scheduled for a CT scan at [facility] on 11/11/23. You will need to arrive at the [facility] and check in at the Radiology desk at 7:50 am. We will call you with your results.

## 2023-11-09 NOTE — Telephone Encounter (Signed)
-----   Message from [facility] [place] sent at 11/09/2023  1:42 PM EDT ----- Please let her know her K is low, please call her in KCL PO 40 Meq daily. # 30 ----- Message ----- From: Dannis Dy, Lab In [place] Sent: 11/09/2023  11:47 AM EDT To: Alben Alma, MD

## 2023-11-09 NOTE — Telephone Encounter (Signed)
 Message left for the patient letting her know that her Potassium was low and we have sent in a prescription for a Potassium supplement to her pharmacy. She may call back with any questions.

## 2023-11-10 NOTE — Progress Notes (Signed)
 Outpatient Surgical Follow Up    Kristin Miller is an 88 y.o. female.   Chief Complaint  Patient presents with   Routine Post Op    HPI: comes with stoma appliance issues and leakage. Some intermittent pain, tolerating diet, ostomy working well but liquid. No fevers,  Past Medical History:  Diagnosis Date   Allergy    Anemia    H/O AS A CHILD   Arthritis    Back pain    Breast cancer of upper-outer quadrant of right female breast (HCC) 06/12/2015   4 millimeter, T1a, N0; ER 90%, PR 0%, HER-2/neu not overexpressed.  Declined radiation therapy.   Collar bone fracture 05/28/2013   Fractured Collar bone on left   Dyspnea    Dysrhythmia    h/o heart skipping beat years ago   Frequent headaches    h/o migraines   Hypertension    Osteopenia 04/2018   Bone density without significant interval change since 2017.   Pneumonia     Past Surgical History:  Procedure Laterality Date   ABDOMINAL HYSTERECTOMY  1970   Partial   BREAST BIOPSY Right 05/28/2015   INVASIVE MAMMARY CARCINOMA    BREAST BIOPSY Right 06/12/2015   Wide excision, sentinel lymph node biopsy.   BREAST EXCISIONAL BIOPSY Right    BREAST LUMPECTOMY WITH AXILLARY LYMPH NODE DISSECTION Right 06/12/2015   4 mm, T1a,N0 withDCIS with clear margins. ER: 90%, PR 0%; Her 2 neu not overexpressed.    CATARACT EXTRACTION W/PHACO Left 02/03/2017   Procedure: CATARACT EXTRACTION PHACO AND INTRAOCULAR LENS PLACEMENT (IOC);  Surgeon: Clair Crews, MD;  Location: [facility];  Service: Ophthalmology;  Laterality: Left;  US   00:28 AP% 16.3 CDE 4.68 Fluid pack lot # [id number]   CATARACT EXTRACTION W/PHACO Right 03/01/2019   Procedure: CATARACT EXTRACTION PHACO AND INTRAOCULAR LENS PLACEMENT (IOC)  RIGHT;  Surgeon: Clair Crews, MD;  Location: [facility];  Service: Ophthalmology;  Laterality: Right;   COLONOSCOPY     COLONOSCOPY WITH PROPOFOL  N/A 06/27/2022   Procedure: COLONOSCOPY WITH PROPOFOL ;  Surgeon: Marnee Sink, MD;  Location: [facility];  Service: Endoscopy;  Laterality: N/A;   COLONOSCOPY WITH PROPOFOL  N/A 10/06/2023   Procedure: COLONOSCOPY WITH PROPOFOL ;  Surgeon: Marnee Sink, MD;  Location: [facility];  Service: Endoscopy;  Laterality: N/A;   ESOPHAGOGASTRODUODENOSCOPY (EGD) WITH PROPOFOL  N/A 06/27/2022   Procedure: ESOPHAGOGASTRODUODENOSCOPY (EGD) WITH PROPOFOL ;  Surgeon: Marnee Sink, MD;  Location: [facility];  Service: Endoscopy;  Laterality: N/A;   PARTIAL COLECTOMY N/A 07/29/2022   Procedure: PARTIAL COLECTOMY, open left, RNFA to assist;  Surgeon: Alben Alma, MD;  Location: [facility];  Service: General;  Laterality: N/A;   POLYPECTOMY  10/06/2023   Procedure: POLYPECTOMY;  Surgeon: Marnee Sink, MD;  Location: [facility];  Service: Endoscopy;;    Family History  Problem Relation Age of Onset   Mental illness Mother        Dementia   Stroke Father    Lung cancer Brother    Breast cancer Other        dx under 65   Brain cancer Granddaughter 12       glioblastoma    Social History:  reports that she has never smoked. She has never been exposed to tobacco smoke. She has never used smokeless tobacco. She reports that she does not drink alcohol and does not use drugs.  Allergies:  Allergies  Allergen Reactions   Combigan  [Brimonidine  Tartrate-Timolol ]  Redness, burning, irritation    Lisinopril  Cough   Other Itching and Rash    SUTURES-(PARTIAL HYSTERECTOMY)    Medications reviewed.    ROS Full ROS performed and is otherwise negative other than what is stated in HPI   BP 117/65   Pulse 96   Temp 98.2 F (36.8 C)   Ht 5' (1.524 m)   Wt 128 lb 6.4 oz (58.2 kg)   SpO2 97%   BMI 25.08 kg/m   Physical Exam NAd alert, walks w/o assitance non toxic Abd: soft, incision healing well, Appliance removed and there is necrosis of mucosa and separation of mucoepithelial junction from 8 to 1 oclock, I was able to place multiple 3-0 vicryl  and replace the appliance. I digitalized the stoma and is widely patent. Also there pink mucosa deeper.   Assessment/Plan: Stoma necrosis but widely patent and deeper areas viable, I will hold off ostomy revision. I will see her next week, we will need some pain medicine and will treat short course a/bs. Advice to start imodium  to thicken effluent We will order CT A/P to make sure we are not missing anything deeper  Evelia Hipp, MD [facility] General Surgeon

## 2023-11-11 ENCOUNTER — Ambulatory Visit (INDEPENDENT_AMBULATORY_CARE_PROVIDER_SITE_OTHER): Admitting: Surgery

## 2023-11-11 ENCOUNTER — Encounter: Payer: Self-pay | Admitting: Surgery

## 2023-11-11 ENCOUNTER — Ambulatory Visit
Admission: RE | Admit: 2023-11-11 | Discharge: 2023-11-11 | Disposition: A | Discharge status: Home / Self Care | Source: Ambulatory Visit | Attending: Surgery | Admitting: Surgery

## 2023-11-11 VITALS — BP 144/82 | HR 98 | Temp 98.4°F | Ht 60.0 in | Wt 129.6 lb

## 2023-11-11 DIAGNOSIS — Z933 Colostomy status: Secondary | ICD-10-CM | POA: Diagnosis not present

## 2023-11-11 DIAGNOSIS — R1084 Generalized abdominal pain: Secondary | ICD-10-CM | POA: Insufficient documentation

## 2023-11-11 DIAGNOSIS — K449 Diaphragmatic hernia 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 gangrene: Secondary | ICD-10-CM | POA: Diagnosis not present

## 2023-11-11 DIAGNOSIS — C186 Malignant neoplasm of descending colon: Secondary | ICD-10-CM | POA: Insufficient documentation

## 2023-11-11 DIAGNOSIS — K9400 Colostomy complication, unspecified: Secondary | ICD-10-CM

## 2023-11-11 DIAGNOSIS — C189 Malignant neoplasm of colon, unspecified: Secondary | ICD-10-CM | POA: Diagnosis not present

## 2023-11-11 MED ORDER — BARIUM SULFATE 2 % PO SUSP
450.0000 mL | Freq: Once | ORAL | Status: AC
  Administered 2023-11-11: 450 mL via ORAL

## 2023-11-11 MED ORDER — IOHEXOL 300 MG/ML  SOLN
100.0000 mL | Freq: Once | INTRAMUSCULAR | Status: AC | PRN
  Administered 2023-11-11: 100 mL via INTRAVENOUS

## 2023-11-11 MED ORDER — METRONIDAZOLE 500 MG PO TABS
ORAL_TABLET | ORAL | 0 refills | Status: DC
Start: 2023-11-11 — End: 2023-12-15

## 2023-11-11 MED ORDER — NEOMYCIN SULFATE 500 MG PO TABS: ORAL_TABLET | ORAL | 0 refills | Status: DC

## 2023-11-11 NOTE — H&P (View-Only) (Signed)
 Outpatient Surgical Follow Up  11/11/2023  Kristin Miller is an 88 y.o. female.   Chief Complaint  Patient presents with   Routine Post Op    Colectomy with ostomy 10/27/23    HPI: She comes with stoma appliance issues . Some intermittent pain, tolerating diet, ostomy working well but , taking po no vomiting. CT personally reviewed and there is separation mucocutaneous junction w necrosis of distal aspect., no intra-abdominal collections Past Medical History:  Diagnosis Date   Allergy    Anemia    H/O AS A CHILD   Arthritis    Back pain    Breast cancer of upper-outer quadrant of right female breast (HCC) 06/12/2015   4 millimeter, T1a, N0; ER 90%, PR 0%, HER-2/neu not overexpressed.  Declined radiation therapy.   Collar bone fracture 05/28/2013   Fractured Collar bone on left   Dyspnea    Dysrhythmia    h/o heart skipping beat years ago   Frequent headaches    h/o migraines   Hypertension    Osteopenia 04/2018   Bone density without significant interval change since 2017.   Pneumonia     Past Surgical History:  Procedure Laterality Date   ABDOMINAL HYSTERECTOMY  1970   Partial   BREAST BIOPSY Right 05/28/2015   INVASIVE MAMMARY CARCINOMA    BREAST BIOPSY Right 06/12/2015   Wide excision, sentinel lymph node biopsy.   BREAST EXCISIONAL BIOPSY Right    BREAST LUMPECTOMY WITH AXILLARY LYMPH NODE DISSECTION Right 06/12/2015   4 mm, T1a,N0 withDCIS with clear margins. ER: 90%, PR 0%; Her 2 neu not overexpressed.    CATARACT EXTRACTION W/PHACO Left 02/03/2017   Procedure: CATARACT EXTRACTION PHACO AND INTRAOCULAR LENS PLACEMENT (IOC);  Surgeon: Clair Crews, MD;  Location: [facility];  Service: Ophthalmology;  Laterality: Left;  US   00:28 AP% 16.3 CDE 4.68 Fluid pack lot # [id number]   CATARACT EXTRACTION W/PHACO Right 03/01/2019   Procedure: CATARACT EXTRACTION PHACO AND INTRAOCULAR LENS PLACEMENT (IOC)  RIGHT;  Surgeon: Clair Crews, MD;  Location: [facility];  Service: Ophthalmology;  Laterality: Right;   COLONOSCOPY     COLONOSCOPY WITH PROPOFOL  N/A 06/27/2022   Procedure: COLONOSCOPY WITH PROPOFOL ;  Surgeon: Marnee Sink, MD;  Location: [facility];  Service: Endoscopy;  Laterality: N/A;   COLONOSCOPY WITH PROPOFOL  N/A 10/06/2023   Procedure: COLONOSCOPY WITH PROPOFOL ;  Surgeon: Marnee Sink, MD;  Location: [facility];  Service: Endoscopy;  Laterality: N/A;   ESOPHAGOGASTRODUODENOSCOPY (EGD) WITH PROPOFOL  N/A 06/27/2022   Procedure: ESOPHAGOGASTRODUODENOSCOPY (EGD) WITH PROPOFOL ;  Surgeon: Marnee Sink, MD;  Location: [facility];  Service: Endoscopy;  Laterality: N/A;   PARTIAL COLECTOMY N/A 07/29/2022   Procedure: PARTIAL COLECTOMY, open left, RNFA to assist;  Surgeon: Alben Alma, MD;  Location: [facility];  Service: General;  Laterality: N/A;   POLYPECTOMY  10/06/2023   Procedure: POLYPECTOMY;  Surgeon: Marnee Sink, MD;  Location: [facility];  Service: Endoscopy;;    Family History  Problem Relation Age of Onset   Mental illness Mother        Dementia   Stroke Father    Lung cancer Brother    Breast cancer Other        dx under 54   Brain cancer Granddaughter 12       glioblastoma    Social History:  reports that she has never smoked. She has never been exposed to tobacco smoke. She has never used smokeless tobacco. She reports that she  does not drink alcohol and does not use drugs.  Allergies:  Allergies  Allergen Reactions   Combigan  [Brimonidine  Tartrate-Timolol ]     Redness, burning, irritation    Lisinopril  Cough   Other Itching and Rash    SUTURES-(PARTIAL HYSTERECTOMY)    Medications reviewed.    ROS Full ROS performed and is otherwise negative other than what is stated in HPI   BP (!) 144/82   Pulse 98   Temp 98.4 F (36.9 C) (Oral)   Ht 5' (1.524 m)   Wt 129 lb 9.6 oz (58.8 kg)   SpO2 99%   BMI 25.31 kg/m   Physical Exam  NAd alert, walks w/o assitance non  toxic Cardiac S1,s2, no murmurs Chest CTA  Abd: soft, incision healing well, Appliance removed and there is necrosis of mucosa and separation of mucoepithelial junction from 8 to 1 oclock, I was able to place multiple 3-0 vicryl and replace the appliance. I digitalized the stoma and is widely patent. Also there pink mucosa deeper.      No results found for this or any previous visit (from the past 48 hours). CT ABDOMEN PELVIS W CONTRAST Result Date: 11/11/2023 CLINICAL DATA:  History of recurrent colon cancer with left colectomy and end colostomy. Assess treatment response. * Tracking Code: BO * EXAM: CT ABDOMEN AND PELVIS WITH CONTRAST TECHNIQUE: Multidetector CT imaging of the abdomen and pelvis was performed using the standard protocol following bolus administration of intravenous contrast. RADIATION DOSE REDUCTION: This exam was performed according to the departmental dose-optimization program which includes automated exposure control, adjustment of the mA and/or kV according to patient size and/or use of iterative reconstruction technique. CONTRAST:  OMNIPAQUE  IOHEXOL  300 MG/ML  SOLN COMPARISON:  CT scan 10/22/2023 FINDINGS: Lower chest: The lung bases are clear of an acute process. No pulmonary lesions pulmonary nodules. Stable large hiatal hernia. Hepatobiliary: Stable small cysts. No worrisome hepatic lesions to suggest metastatic disease. No intrahepatic biliary dilatation. The gallbladder is unremarkable. Normal caliber and course of the common bile duct. Pancreas: No mass, inflammation or ductal dilatation. Spleen: Normal in size without focal abnormality. Adrenals/Urinary Tract: The adrenal glands are normal. No renal lesions or hydronephrosis. Stomach/Bowel: Large hiatal hernia. The duodenum and small bowel are unremarkable. Surgical changes from a left colectomy and left lower colostomy. The distal colostomy is abnormal. Beginning approximately 10 cm from the ostomy site the colon is  very amorphous and is filled with fluid. There are abnormal gas collections around the bowel and there is a large collection of gas in the subcutaneous tissues. Findings very worrisome for necrosis of the distal 10 cm of colon. The remainder the right colon is unremarkable. No intra-abdominal abscess is identified. Vascular/Lymphatic: Stable vascular calcifications. No abdominal or pelvic lymphadenopathy. Reproductive: Surgically absent. Other: None Musculoskeletal: No acute bony findings. Stable T12 compression fracture with vertebral plana. IMPRESSION: 1. Surgical changes from a left colectomy and left lower colostomy. The distal colostomy is abnormal. Beginning approximately 10 cm from the ostomy site the colon is very amorphous and is filled with fluid. There are abnormal gas collections around the bowel and there is a large collection of gas in the subcutaneous tissues. Findings very worrisome for necrosis of the distal 10 cm of colon. 2. No findings for abdominal/pelvic metastatic disease. 3. Stable large hiatal hernia. These results will be called to the ordering clinician or representative by the Radiologist Assistant, and communication documented in the PACS or [employer]. Electronically Signed   By:  Marrian Siva M.D.   On: 11/11/2023 10:52    Assessment/Plan: Colostomy issues w separation of mucocutaneous junction and distal necrosis. D/W the pt in detail. I do think we should go ahead and do formal revision in the OR, SHe is not toxic, stoma still functional so there is no need for emergent intervention. She understands that we will resect a bit more bowel to make sure there is a good stoma function. For now she is to continue a/bs and po intake,. I do not want to do formal bowel pre as this creates significant dehydration issues and stoma leakage. She understands and agree w Plan all question answered   Evelia Hipp, MD [facility] General Surgeon

## 2023-11-11 NOTE — Patient Instructions (Signed)
 We have discussed removing a portion of your damaged colon through 4 small incisions today. We will schedule this surgery at [facility] with Dr. Dana Duncan. Please plan a hospital stay of 3-7 days for surgery and recovery time.  If you are on any injectable weight loss medication, you will need to stop taking your GLP-1 injectable (weight loss) medications 8 days before your surgery to avoid any complications with anesthesia.   We will have you complete a bowel prep prior to your surgery. Please see information provided.  You have also been given a (Blue) Pre-Care Sheet with more information regarding your particular surgery. Our surgery scheduler will call you to verify surgery date and to go over information.  Please review all information given.  You will need to arrange to be out of work for approximately 2 weeks and then you may return with a lifting restriction for 4 more weeks. If you have FMLA or Disability paperwork that needs to be filled out, please have your company fax your paperwork to [phone] or you may drop this by either office. This paperwork will be filled out within 3 days after your surgery has been completed.  Please call our office with any questions or concerns prior to your scheduled surgery.   Laparoscopic Colectomy Laparoscopic colectomy is surgery to remove part or all of the large intestine (colon). This procedure may be used to treat several conditions, including: Inflammation and infection of the colon (diverticulitis). Tumors or masses in the colon. Inflammatory bowel disease, such as Crohn disease or ulcerative colitis. Colectomy is an option when symptoms cannot be controlled with medicines. Bleeding from the colon that cannot be controlled by another method. Blockage or obstruction of the colon.  Tell a health care provider about: Any allergies you have. All medicines you are taking, including vitamins, herbs, eye drops, creams, and over-the-counter  medicines. Any problems you or family members have had with anesthetic medicines. Any blood disorders you have. Any surgeries you have had. Any medical conditions you have. What are the risks? Generally, this is a safe procedure. However, problems may occur, including: Infection. Bleeding. Allergic reactions to medicines or dyes. Damage to other structures or organs. Leaking from where the colon was sewn together. Future blockage of the small intestines from scar tissue. Another surgery may be needed to repair this. Needing to convert to an open procedure. Complications such as damage to other organs or excessive bleeding may require the surgeon to convert from a laparoscopic procedure to an open procedure. This involves making a larger incision in the abdomen.  What happens before the procedure?  Medicines Ask your health care provider about: Changing or stopping your regular medicines. This is especially important if you are taking diabetes medicines or blood thinners. Taking medicines such as aspirin and ibuprofen . These medicines can thin your blood. Do not take these medicines before your procedure if your health care provider instructs you not to. You may be given antibiotic medicine to clean out bacteria from your colon. Follow the directions carefully and take the medicine at the correct time. General instructions You may be prescribed an oral bowel prep to clean out your colon in preparation for the surgery: Follow instructions from your health care provider about how to do this. Do not eat or drink anything else after you have started the bowel prep, unless your health care provider tells you it is safe to do so. Do not use any products that contain nicotine or tobacco, such as  cigarettes and e-cigarettes. If you need help quitting, ask your health care provider. What happens during the procedure? To reduce your risk of infection: Your health care team will wash or sanitize  their hands. Your skin will be washed with soap. An IV tube will be inserted into one of your veins to deliver fluid and medication. You will be given one of the following: A medicine to help you relax (sedative). A medicine to make you fall asleep (general anesthetic). Small monitors will be connected to your body. They will be used to check your heart, blood pressure, and oxygen level. A breathing tube may be placed into your lungs during the procedure. A thin, flexible tube (catheter) will be placed into your bladder to drain urine. A tube may be placed through your nose and into your stomach to drain stomach fluids (nasogastric tube, or NG tube). Your abdomen will be filled with air so it expands. This gives the surgeon more room to operate and makes your organs easier to see. Several small cuts (incisions) will be made in your abdomen. A thin, lighted tube with a tiny camera on the end (laparoscope) will be put through one of the small incisions. The camera on the laparoscope will send a picture to a computer screen in the operating room. This will give the surgeon a good view inside your abdomen. Hollow tubes will be put through the other small incisions in your abdomen. The tools that are needed for the procedure will be put through these tubes. Clamps or staples will be put on both ends of the diseased part of the colon. The part of the intestine between the clamps or staples will be removed. If possible, the ends of the healthy colon that remain will be stitched (sutured) or stapled together to allow your body to pass waste (stool). Sometimes, the remaining colon cannot be stitched back together. If this is the case, a colostomy will be needed. If you need a colostomy: An opening to the outside of your body (stoma) will be made through your abdomen. The end of your colon will be brought to the opening. It will be stitched to the skin. A bag will be attached to the opening. Stool will  drain into this removable bag. The colostomy may be temporary or permanent. The incisions from the colectomy will be closed with sutures or staples. The procedure may vary among health care providers and hospitals. What happens after the procedure? Your blood pressure, heart rate, breathing rate, and blood oxygen level will be monitored until the medicines you were given have worn off. You will receive fluids through an IV tube until your bowels start to work properly. Once your bowels are working again, you will be given clear liquids first and then solid food as tolerated. You will be given medicines to control your pain and nausea, if needed. Do not drive for 24 hours if you were given a sedative. This information is not intended to replace advice given to you by your health care provider. Make sure you discuss any questions you have with your health care provider. Document Released: 09/27/2002 Document Revised: 04/07/2016 Document Reviewed: 04/07/2016 Elsevier Interactive Patient Education  2018 [employer].     Laparoscopic Colectomy, Care After This sheet gives you information about how to care for yourself after your procedure. Your health care provider may also give you more specific instructions. If you have problems or questions, contact your health care provider. What can I expect after the  procedure? After your procedure, it is common to have the following: Pain in your abdomen, especially in the incision areas. You will be given medicine to control the pain. Tiredness. This is a normal part of the recovery process. Your energy level will return to normal over the next several weeks. Changes in your bowel movements, such as constipation or needing to go more often. Talk with your health care provider about how to manage this.  Follow these instructions at home: Medicines Take over-the-counter and prescription medicines only as told by your health care provider. Do not drive  or use heavy machinery while taking prescription pain medicine. Do not drink alcohol while taking prescription pain medicine. If you were prescribed an antibiotic medicine, use it as told by your health care provider. Do not stop using the antibiotic even if you start to feel better. Incision care Follow instructions from your health care provider about how to take care of your incision areas. Make sure you: Keep your incisions clean and dry. Wash your hands with soap and water  before and after applying medicine to the areas, and before and after changing your bandage (dressing). If soap and water  are not available, use hand sanitizer. Change your dressing as told by your health care provider. Leave stitches (sutures), skin glue, or adhesive strips in place. These skin closures may need to stay in place for 2 weeks or longer. If adhesive strip edges start to loosen and curl up, you may trim the loose edges. Do not remove adhesive strips completely unless your health care provider tells you to do that. Do not wear tight clothing over the incisions. Tight clothing may rub and irritate the incision areas, which may cause the incisions to open. Do not take baths, swim, or use a hot tub until your health care provider approves. Ask your health care provider if you can take showers. You may only be allowed to take sponge baths for bathing. Check your incision area every day for signs of infection. Check for: More redness, swelling, or pain. More fluid or blood. Warmth. Pus or a bad smell. Activity Avoid lifting anything that is heavier than 10 lb (4.5 kg) for 2 weeks or until your health care provider says it is okay. You may resume normal activities as told by your health care provider. Ask your health care provider what activities are safe for you. Take rest breaks during the day as needed. Eating and drinking Follow instructions from your health care provider about what you can eat after  surgery. To prevent or treat constipation while you are taking prescription pain medicine, your health care provider may recommend that you: Drink enough fluid to keep your urine clear or pale yellow. Take over-the-counter or prescription medicines. Eat foods that are high in fiber, such as fresh fruits and vegetables, whole grains, and beans. Limit foods that are high in fat and processed sugars, such as fried and sweet foods. General instructions Ask your health care provider when you will need an appointment to get your sutures or staples removed. Keep all follow-up visits as told by your health care provider. This is important. Contact a health care provider if: You have more redness, swelling, or pain around your incisions. You have more fluid or blood coming from the incisions. Your incisions feel warm to the touch. You have pus or a bad smell coming from your incisions or your dressing. You have a fever. You have an incision that breaks open (edges not staying together)  after sutures or staples have been removed. Get help right away if: You develop a rash. You have chest pain or difficulty breathing. You have pain or swelling in your legs. You feel light-headed or you faint. Your abdomen swells (becomes distended). You have nausea or vomiting. You have blood in your stool (feces). This information is not intended to replace advice given to you by your health care provider. Make sure you discuss any questions you have with your health care provider. Document Released: 01/24/2005 Document Revised: 04/07/2016 Document Reviewed: 04/07/2016 Elsevier Interactive Patient Education  [employer].

## 2023-11-11 NOTE — Progress Notes (Signed)
 Outpatient Surgical Follow Up  11/11/2023  Kristin Miller is an 88 y.o. female.   Chief Complaint  Patient presents with   Routine Post Op    Colectomy with ostomy 10/27/23    HPI: She comes with stoma appliance issues . Some intermittent pain, tolerating diet, ostomy working well but , taking po no vomiting. CT personally reviewed and there is separation mucocutaneous junction w necrosis of distal aspect., no intra-abdominal collections Past Medical History:  Diagnosis Date   Allergy    Anemia    H/O AS A CHILD   Arthritis    Back pain    Breast cancer of upper-outer quadrant of right female breast (HCC) 06/12/2015   4 millimeter, T1a, N0; ER 90%, PR 0%, HER-2/neu not overexpressed.  Declined radiation therapy.   Collar bone fracture 05/28/2013   Fractured Collar bone on left   Dyspnea    Dysrhythmia    h/o heart skipping beat years ago   Frequent headaches    h/o migraines   Hypertension    Osteopenia 04/2018   Bone density without significant interval change since 2017.   Pneumonia     Past Surgical History:  Procedure Laterality Date   ABDOMINAL HYSTERECTOMY  1970   Partial   BREAST BIOPSY Right 05/28/2015   INVASIVE MAMMARY CARCINOMA    BREAST BIOPSY Right 06/12/2015   Wide excision, sentinel lymph node biopsy.   BREAST EXCISIONAL BIOPSY Right    BREAST LUMPECTOMY WITH AXILLARY LYMPH NODE DISSECTION Right 06/12/2015   4 mm, T1a,N0 withDCIS with clear margins. ER: 90%, PR 0%; Her 2 neu not overexpressed.    CATARACT EXTRACTION W/PHACO Left 02/03/2017   Procedure: CATARACT EXTRACTION PHACO AND INTRAOCULAR LENS PLACEMENT (IOC);  Surgeon: Clair Crews, MD;  Location: [facility];  Service: Ophthalmology;  Laterality: Left;  US   00:28 AP% 16.3 CDE 4.68 Fluid pack lot # [id number]   CATARACT EXTRACTION W/PHACO Right 03/01/2019   Procedure: CATARACT EXTRACTION PHACO AND INTRAOCULAR LENS PLACEMENT (IOC)  RIGHT;  Surgeon: Clair Crews, MD;  Location: [facility];  Service: Ophthalmology;  Laterality: Right;   COLONOSCOPY     COLONOSCOPY WITH PROPOFOL  N/A 06/27/2022   Procedure: COLONOSCOPY WITH PROPOFOL ;  Surgeon: Marnee Sink, MD;  Location: [facility];  Service: Endoscopy;  Laterality: N/A;   COLONOSCOPY WITH PROPOFOL  N/A 10/06/2023   Procedure: COLONOSCOPY WITH PROPOFOL ;  Surgeon: Marnee Sink, MD;  Location: [facility];  Service: Endoscopy;  Laterality: N/A;   ESOPHAGOGASTRODUODENOSCOPY (EGD) WITH PROPOFOL  N/A 06/27/2022   Procedure: ESOPHAGOGASTRODUODENOSCOPY (EGD) WITH PROPOFOL ;  Surgeon: Marnee Sink, MD;  Location: [facility];  Service: Endoscopy;  Laterality: N/A;   PARTIAL COLECTOMY N/A 07/29/2022   Procedure: PARTIAL COLECTOMY, open left, RNFA to assist;  Surgeon: Alben Alma, MD;  Location: [facility];  Service: General;  Laterality: N/A;   POLYPECTOMY  10/06/2023   Procedure: POLYPECTOMY;  Surgeon: Marnee Sink, MD;  Location: [facility];  Service: Endoscopy;;    Family History  Problem Relation Age of Onset   Mental illness Mother        Dementia   Stroke Father    Lung cancer Brother    Breast cancer Other        dx under 54   Brain cancer Granddaughter 12       glioblastoma    Social History:  reports that she has never smoked. She has never been exposed to tobacco smoke. She has never used smokeless tobacco. She reports that she  does not drink alcohol and does not use drugs.  Allergies:  Allergies  Allergen Reactions   Combigan  [Brimonidine  Tartrate-Timolol ]     Redness, burning, irritation    Lisinopril  Cough   Other Itching and Rash    SUTURES-(PARTIAL HYSTERECTOMY)    Medications reviewed.    ROS Full ROS performed and is otherwise negative other than what is stated in HPI   BP (!) 144/82   Pulse 98   Temp 98.4 F (36.9 C) (Oral)   Ht 5' (1.524 m)   Wt 129 lb 9.6 oz (58.8 kg)   SpO2 99%   BMI 25.31 kg/m   Physical Exam  NAd alert, walks w/o assitance non  toxic Cardiac S1,s2, no murmurs Chest CTA  Abd: soft, incision healing well, Appliance removed and there is necrosis of mucosa and separation of mucoepithelial junction from 8 to 1 oclock, I was able to place multiple 3-0 vicryl and replace the appliance. I digitalized the stoma and is widely patent. Also there pink mucosa deeper.      No results found for this or any previous visit (from the past 48 hours). CT ABDOMEN PELVIS W CONTRAST Result Date: 11/11/2023 CLINICAL DATA:  History of recurrent colon cancer with left colectomy and end colostomy. Assess treatment response. * Tracking Code: BO * EXAM: CT ABDOMEN AND PELVIS WITH CONTRAST TECHNIQUE: Multidetector CT imaging of the abdomen and pelvis was performed using the standard protocol following bolus administration of intravenous contrast. RADIATION DOSE REDUCTION: This exam was performed according to the departmental dose-optimization program which includes automated exposure control, adjustment of the mA and/or kV according to patient size and/or use of iterative reconstruction technique. CONTRAST:  OMNIPAQUE  IOHEXOL  300 MG/ML  SOLN COMPARISON:  CT scan 10/22/2023 FINDINGS: Lower chest: The lung bases are clear of an acute process. No pulmonary lesions pulmonary nodules. Stable large hiatal hernia. Hepatobiliary: Stable small cysts. No worrisome hepatic lesions to suggest metastatic disease. No intrahepatic biliary dilatation. The gallbladder is unremarkable. Normal caliber and course of the common bile duct. Pancreas: No mass, inflammation or ductal dilatation. Spleen: Normal in size without focal abnormality. Adrenals/Urinary Tract: The adrenal glands are normal. No renal lesions or hydronephrosis. Stomach/Bowel: Large hiatal hernia. The duodenum and small bowel are unremarkable. Surgical changes from a left colectomy and left lower colostomy. The distal colostomy is abnormal. Beginning approximately 10 cm from the ostomy site the colon is  very amorphous and is filled with fluid. There are abnormal gas collections around the bowel and there is a large collection of gas in the subcutaneous tissues. Findings very worrisome for necrosis of the distal 10 cm of colon. The remainder the right colon is unremarkable. No intra-abdominal abscess is identified. Vascular/Lymphatic: Stable vascular calcifications. No abdominal or pelvic lymphadenopathy. Reproductive: Surgically absent. Other: None Musculoskeletal: No acute bony findings. Stable T12 compression fracture with vertebral plana. IMPRESSION: 1. Surgical changes from a left colectomy and left lower colostomy. The distal colostomy is abnormal. Beginning approximately 10 cm from the ostomy site the colon is very amorphous and is filled with fluid. There are abnormal gas collections around the bowel and there is a large collection of gas in the subcutaneous tissues. Findings very worrisome for necrosis of the distal 10 cm of colon. 2. No findings for abdominal/pelvic metastatic disease. 3. Stable large hiatal hernia. These results will be called to the ordering clinician or representative by the Radiologist Assistant, and communication documented in the PACS or [employer]. Electronically Signed   By:  Marrian Siva M.D.   On: 11/11/2023 10:52    Assessment/Plan: Colostomy issues w separation of mucocutaneous junction and distal necrosis. D/W the pt in detail. I do think we should go ahead and do formal revision in the OR, SHe is not toxic, stoma still functional so there is no need for emergent intervention. She understands that we will resect a bit more bowel to make sure there is a good stoma function. For now she is to continue a/bs and po intake,. I do not want to do formal bowel pre as this creates significant dehydration issues and stoma leakage. She understands and agree w Plan all question answered   Evelia Hipp, MD [facility] General Surgeon

## 2023-11-13 ENCOUNTER — Inpatient Hospital Stay: Admitting: Anesthesiology

## 2023-11-13 ENCOUNTER — Encounter: Payer: Self-pay | Admitting: Surgery

## 2023-11-13 ENCOUNTER — Other Ambulatory Visit: Payer: Self-pay

## 2023-11-13 ENCOUNTER — Inpatient Hospital Stay
Admission: RE | Admit: 2023-11-13 | Discharge: 2023-12-15 | DRG: 329 | Disposition: A | Discharge status: Home Health | Attending: Surgery | Admitting: Surgery

## 2023-11-13 ENCOUNTER — Encounter
Admission: RE | Disposition: A | Discharge status: Home Health | Payer: Self-pay | Source: Home / Self Care | Attending: Surgery

## 2023-11-13 DIAGNOSIS — Z9049 Acquired absence of other specified parts of digestive tract: Secondary | ICD-10-CM | POA: Diagnosis not present

## 2023-11-13 DIAGNOSIS — Z85828 Personal history of other malignant neoplasm of skin: Secondary | ICD-10-CM | POA: Diagnosis not present

## 2023-11-13 DIAGNOSIS — J441 Chronic obstructive pulmonary disease with (acute) exacerbation: Secondary | ICD-10-CM | POA: Diagnosis not present

## 2023-11-13 DIAGNOSIS — K434 Parastomal hernia with gangrene: Secondary | ICD-10-CM | POA: Diagnosis present

## 2023-11-13 DIAGNOSIS — R188 Other ascites: Secondary | ICD-10-CM | POA: Diagnosis not present

## 2023-11-13 DIAGNOSIS — C186 Malignant neoplasm of descending colon: Secondary | ICD-10-CM | POA: Diagnosis present

## 2023-11-13 DIAGNOSIS — I110 Hypertensive heart disease with heart failure: Secondary | ICD-10-CM | POA: Diagnosis present

## 2023-11-13 DIAGNOSIS — L02211 Cutaneous abscess of abdominal wall: Secondary | ICD-10-CM | POA: Diagnosis not present

## 2023-11-13 DIAGNOSIS — R0602 Shortness of breath: Secondary | ICD-10-CM | POA: Diagnosis not present

## 2023-11-13 DIAGNOSIS — K9409 Other complications of colostomy: Secondary | ICD-10-CM | POA: Diagnosis not present

## 2023-11-13 DIAGNOSIS — C189 Malignant neoplasm of colon, unspecified: Secondary | ICD-10-CM | POA: Diagnosis present

## 2023-11-13 DIAGNOSIS — K449 Diaphragmatic hernia 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 gangrene: Secondary | ICD-10-CM | POA: Diagnosis not present

## 2023-11-13 DIAGNOSIS — Z853 Personal history of malignant neoplasm of breast: Secondary | ICD-10-CM

## 2023-11-13 DIAGNOSIS — J9811 Atelectasis: Secondary | ICD-10-CM | POA: Diagnosis not present

## 2023-11-13 DIAGNOSIS — B3789 Other sites of candidiasis: Secondary | ICD-10-CM | POA: Diagnosis present

## 2023-11-13 DIAGNOSIS — K9400 Colostomy complication, unspecified: Secondary | ICD-10-CM | POA: Diagnosis present

## 2023-11-13 DIAGNOSIS — Z923 Personal history of irradiation: Secondary | ICD-10-CM

## 2023-11-13 DIAGNOSIS — D62 Acute posthemorrhagic anemia: Secondary | ICD-10-CM | POA: Diagnosis not present

## 2023-11-13 DIAGNOSIS — I5033 Acute on chronic diastolic (congestive) heart failure: Secondary | ICD-10-CM | POA: Diagnosis not present

## 2023-11-13 DIAGNOSIS — K632 Fistula of intestine: Secondary | ICD-10-CM | POA: Diagnosis not present

## 2023-11-13 DIAGNOSIS — D649 Anemia, unspecified: Secondary | ICD-10-CM | POA: Diagnosis not present

## 2023-11-13 DIAGNOSIS — K660 Peritoneal adhesions (postprocedural) (postinfection): Secondary | ICD-10-CM | POA: Diagnosis present

## 2023-11-13 DIAGNOSIS — I499 Cardiac arrhythmia, unspecified: Secondary | ICD-10-CM | POA: Diagnosis not present

## 2023-11-13 DIAGNOSIS — Z452 Encounter for adjustment and management of vascular access device: Secondary | ICD-10-CM | POA: Diagnosis not present

## 2023-11-13 DIAGNOSIS — E611 Iron deficiency: Secondary | ICD-10-CM | POA: Diagnosis not present

## 2023-11-13 DIAGNOSIS — K55041 Focal (segmental) acute infarction of large intestine: Secondary | ICD-10-CM | POA: Diagnosis present

## 2023-11-13 DIAGNOSIS — K7689 Other specified diseases of liver: Secondary | ICD-10-CM | POA: Diagnosis not present

## 2023-11-13 DIAGNOSIS — Z419 Encounter for procedure for purposes other than remedying health state, unspecified: Principal | ICD-10-CM

## 2023-11-13 DIAGNOSIS — R918 Other nonspecific abnormal finding of lung field: Secondary | ICD-10-CM | POA: Diagnosis not present

## 2023-11-13 DIAGNOSIS — Z7983 Long term (current) use of bisphosphonates: Secondary | ICD-10-CM

## 2023-11-13 DIAGNOSIS — M810 Age-related osteoporosis without current pathological fracture: Secondary | ICD-10-CM | POA: Diagnosis present

## 2023-11-13 DIAGNOSIS — Y833 Surgical operation with formation of external stoma as the cause of abnormal reaction of the patient, or of later complication, without mention of misadventure at the time of the procedure: Secondary | ICD-10-CM | POA: Diagnosis present

## 2023-11-13 DIAGNOSIS — R531 Weakness: Secondary | ICD-10-CM | POA: Diagnosis not present

## 2023-11-13 DIAGNOSIS — R1084 Generalized abdominal pain: Secondary | ICD-10-CM | POA: Diagnosis present

## 2023-11-13 DIAGNOSIS — D75839 Thrombocytosis, unspecified: Secondary | ICD-10-CM | POA: Diagnosis not present

## 2023-11-13 DIAGNOSIS — Z90710 Acquired absence of both cervix and uterus: Secondary | ICD-10-CM | POA: Diagnosis not present

## 2023-11-13 DIAGNOSIS — Z79899 Other long term (current) drug therapy: Secondary | ICD-10-CM | POA: Diagnosis not present

## 2023-11-13 DIAGNOSIS — J9601 Acute respiratory failure with hypoxia: Secondary | ICD-10-CM | POA: Diagnosis not present

## 2023-11-13 DIAGNOSIS — K651 Peritoneal abscess: Secondary | ICD-10-CM | POA: Diagnosis not present

## 2023-11-13 DIAGNOSIS — R600 Localized edema: Secondary | ICD-10-CM | POA: Diagnosis not present

## 2023-11-13 DIAGNOSIS — I5031 Acute diastolic (congestive) heart failure: Secondary | ICD-10-CM | POA: Diagnosis not present

## 2023-11-13 DIAGNOSIS — I517 Cardiomegaly: Secondary | ICD-10-CM | POA: Diagnosis not present

## 2023-11-13 HISTORY — PX: COLOSTOMY REVISION: SHX5232

## 2023-11-13 SURGERY — REVISION, COLOSTOMY
Anesthesia: General

## 2023-11-13 MED ORDER — ACETAMINOPHEN 500 MG PO TABS
ORAL_TABLET | ORAL | Status: AC
  Filled 2023-11-13: qty 2

## 2023-11-13 MED ORDER — CHLORHEXIDINE GLUCONATE CLOTH 2 % EX PADS
6.0000 | MEDICATED_PAD | Freq: Once | CUTANEOUS | Status: DC
Start: 2023-11-13 — End: 2023-11-13

## 2023-11-13 MED ORDER — BUPIVACAINE LIPOSOME 1.3 % IJ SUSP
INTRAMUSCULAR | Status: AC
  Filled 2023-11-13: qty 20

## 2023-11-13 MED ORDER — ALBUMIN HUMAN 5 % IV SOLN: INTRAVENOUS | Status: DC | PRN

## 2023-11-13 MED ORDER — SODIUM CHLORIDE 0.9 % IV SOLN
2.0000 g | INTRAVENOUS | Status: AC
  Administered 2023-11-13: 2 g via INTRAVENOUS

## 2023-11-13 MED ORDER — CHLORHEXIDINE GLUCONATE CLOTH 2 % EX PADS: 6.0000 | MEDICATED_PAD | Freq: Once | CUTANEOUS | Status: DC

## 2023-11-13 MED ORDER — HYDRALAZINE HCL 20 MG/ML IJ SOLN: 10.0000 mg | INTRAMUSCULAR | Status: DC | PRN

## 2023-11-13 MED ORDER — ONDANSETRON HCL 4 MG/2ML IJ SOLN
INTRAMUSCULAR | Status: AC
  Filled 2023-11-13: qty 2

## 2023-11-13 MED ORDER — LACTATED RINGERS IV SOLN: INTRAVENOUS | Status: DC

## 2023-11-13 MED ORDER — PANTOPRAZOLE SODIUM 40 MG IV SOLR
40.0000 mg | Freq: Two times a day (BID) | INTRAVENOUS | Status: DC
  Administered 2023-11-13 – 2023-12-09 (×52): 40 mg via INTRAVENOUS
  Filled 2023-11-13 (×53): qty 10

## 2023-11-13 MED ORDER — ROCURONIUM BROMIDE 100 MG/10ML IV SOLN
INTRAVENOUS | Status: DC | PRN
  Administered 2023-11-13 (×2): 20 mg via INTRAVENOUS
  Administered 2023-11-13: 40 mg via INTRAVENOUS

## 2023-11-13 MED ORDER — 0.9 % SODIUM CHLORIDE (POUR BTL) OPTIME
TOPICAL | Status: DC | PRN
  Administered 2023-11-13: 3000 mL

## 2023-11-13 MED ORDER — ACETAMINOPHEN 10 MG/ML IV SOLN
INTRAVENOUS | Status: DC | PRN
  Administered 2023-11-13: 1000 mg via INTRAVENOUS

## 2023-11-13 MED ORDER — AMLODIPINE BESYLATE 5 MG PO TABS
5.0000 mg | ORAL_TABLET | Freq: Every day | ORAL | Status: DC
  Administered 2023-11-13 – 2023-12-15 (×33): 5 mg via ORAL
  Filled 2023-11-13 (×33): qty 1

## 2023-11-13 MED ORDER — FENTANYL CITRATE (PF) 100 MCG/2ML IJ SOLN
INTRAMUSCULAR | Status: DC | PRN
Start: 2023-11-13 — End: 2023-11-13
  Administered 2023-11-13 (×4): 50 ug via INTRAVENOUS

## 2023-11-13 MED ORDER — FENTANYL CITRATE (PF) 100 MCG/2ML IJ SOLN: 25.0000 ug | INTRAMUSCULAR | Status: DC | PRN

## 2023-11-13 MED ORDER — OXYCODONE HCL 5 MG PO TABS
5.0000 mg | ORAL_TABLET | ORAL | Status: DC | PRN
  Administered 2023-11-13 – 2023-11-14 (×2): 10 mg via ORAL
  Administered 2023-11-18 – 2023-11-20 (×2): 5 mg via ORAL
  Administered 2023-11-24: 10 mg via ORAL
  Filled 2023-11-13: qty 1
  Filled 2023-11-13: qty 2
  Filled 2023-11-13 (×2): qty 1
  Filled 2023-11-13 (×3): qty 2

## 2023-11-13 MED ORDER — DEXAMETHASONE SODIUM PHOSPHATE 10 MG/ML IJ SOLN
INTRAMUSCULAR | Status: DC | PRN
  Administered 2023-11-13: 10 mg via INTRAVENOUS

## 2023-11-13 MED ORDER — SODIUM CHLORIDE 0.9 % IV SOLN
3.0000 g | Freq: Four times a day (QID) | INTRAVENOUS | Status: AC
  Administered 2023-11-13 – 2023-11-27 (×56): 3 g via INTRAVENOUS
  Filled 2023-11-13 (×58): qty 8

## 2023-11-13 MED ORDER — KETOROLAC TROMETHAMINE 15 MG/ML IJ SOLN
15.0000 mg | Freq: Four times a day (QID) | INTRAMUSCULAR | Status: AC
  Administered 2023-11-13 – 2023-11-18 (×20): 15 mg via INTRAVENOUS
  Filled 2023-11-13 (×20): qty 1

## 2023-11-13 MED ORDER — LIDOCAINE HCL (CARDIAC) PF 100 MG/5ML IV SOSY
PREFILLED_SYRINGE | INTRAVENOUS | Status: DC | PRN
  Administered 2023-11-13: 60 mg via INTRAVENOUS

## 2023-11-13 MED ORDER — PROCHLORPERAZINE EDISYLATE 10 MG/2ML IJ SOLN: 5.0000 mg | Freq: Four times a day (QID) | INTRAMUSCULAR | Status: DC | PRN

## 2023-11-13 MED ORDER — ACETAMINOPHEN 10 MG/ML IV SOLN: 1000.0000 mg | Freq: Once | INTRAVENOUS | Status: DC | PRN

## 2023-11-13 MED ORDER — IRRISEPT - 450ML BOTTLE WITH 0.05% CHG IN STERILE WATER, USP 99.95% OPTIME
TOPICAL | Status: DC | PRN
  Administered 2023-11-13: 450 mL

## 2023-11-13 MED ORDER — DEXAMETHASONE SODIUM PHOSPHATE 10 MG/ML IJ SOLN
INTRAMUSCULAR | Status: AC
  Filled 2023-11-13: qty 1

## 2023-11-13 MED ORDER — ONDANSETRON HCL 4 MG/2ML IJ SOLN
4.0000 mg | Freq: Four times a day (QID) | INTRAMUSCULAR | Status: DC | PRN
  Administered 2023-12-01 – 2023-12-12 (×5): 4 mg via INTRAVENOUS
  Filled 2023-11-13 (×5): qty 2

## 2023-11-13 MED ORDER — FENTANYL CITRATE (PF) 100 MCG/2ML IJ SOLN
INTRAMUSCULAR | Status: AC
  Filled 2023-11-13: qty 2

## 2023-11-13 MED ORDER — ACETAMINOPHEN 10 MG/ML IV SOLN
INTRAVENOUS | Status: AC
  Filled 2023-11-13: qty 100

## 2023-11-13 MED ORDER — GABAPENTIN 300 MG PO CAPS
300.0000 mg | ORAL_CAPSULE | ORAL | Status: AC
Start: 2023-11-13 — End: 2023-11-13
  Administered 2023-11-13: 300 mg via ORAL

## 2023-11-13 MED ORDER — ALBUMIN HUMAN 5 % IV SOLN
INTRAVENOUS | Status: AC
  Filled 2023-11-13: qty 500

## 2023-11-13 MED ORDER — BUPIVACAINE-EPINEPHRINE (PF) 0.25% -1:200000 IJ SOLN
INTRAMUSCULAR | Status: AC
  Filled 2023-11-13: qty 30

## 2023-11-13 MED ORDER — ONDANSETRON HCL 4 MG/2ML IJ SOLN: 4.0000 mg | Freq: Once | INTRAMUSCULAR | Status: DC | PRN

## 2023-11-13 MED ORDER — ONDANSETRON HCL 4 MG/2ML IJ SOLN
INTRAMUSCULAR | Status: DC | PRN
  Administered 2023-11-13: 4 mg via INTRAVENOUS

## 2023-11-13 MED ORDER — PROPOFOL 10 MG/ML IV BOLUS
INTRAVENOUS | Status: AC
  Filled 2023-11-13: qty 20

## 2023-11-13 MED ORDER — ACETAMINOPHEN 500 MG PO TABS
1000.0000 mg | ORAL_TABLET | Freq: Four times a day (QID) | ORAL | Status: DC
  Administered 2023-11-13 – 2023-12-15 (×105): 1000 mg via ORAL
  Filled 2023-11-13 (×118): qty 2

## 2023-11-13 MED ORDER — METHOCARBAMOL 1000 MG/10ML IJ SOLN: 500.0000 mg | Freq: Three times a day (TID) | INTRAMUSCULAR | Status: DC | PRN

## 2023-11-13 MED ORDER — OXYCODONE HCL 5 MG/5ML PO SOLN: 5.0000 mg | Freq: Once | ORAL | Status: DC | PRN

## 2023-11-13 MED ORDER — DIPHENHYDRAMINE HCL 12.5 MG/5ML PO ELIX: 12.5000 mg | ORAL_SOLUTION | Freq: Four times a day (QID) | ORAL | Status: DC | PRN

## 2023-11-13 MED ORDER — FENTANYL CITRATE (PF) 100 MCG/2ML IJ SOLN
INTRAMUSCULAR | Status: AC
Start: 2023-11-13 — End: ?
  Filled 2023-11-13: qty 2

## 2023-11-13 MED ORDER — PROCHLORPERAZINE MALEATE 10 MG PO TABS: 10.0000 mg | ORAL_TABLET | Freq: Four times a day (QID) | ORAL | Status: DC | PRN

## 2023-11-13 MED ORDER — ENOXAPARIN SODIUM 40 MG/0.4ML IJ SOSY
40.0000 mg | PREFILLED_SYRINGE | INTRAMUSCULAR | Status: DC
  Administered 2023-11-14 – 2023-12-09 (×26): 40 mg via SUBCUTANEOUS
  Filled 2023-11-13 (×26): qty 0.4

## 2023-11-13 MED ORDER — SODIUM CHLORIDE (PF) 0.9 % IJ SOLN
INTRAMUSCULAR | Status: DC | PRN
  Administered 2023-11-13: 100 mL via INTRAMUSCULAR

## 2023-11-13 MED ORDER — ROCURONIUM BROMIDE 10 MG/ML (PF) SYRINGE
PREFILLED_SYRINGE | INTRAVENOUS | Status: AC
  Filled 2023-11-13: qty 10

## 2023-11-13 MED ORDER — MORPHINE SULFATE (PF) 2 MG/ML IV SOLN
2.0000 mg | INTRAVENOUS | Status: DC | PRN
  Administered 2023-11-13 – 2023-11-14 (×2): 2 mg via INTRAVENOUS
  Filled 2023-11-13 (×2): qty 1

## 2023-11-13 MED ORDER — CHLORHEXIDINE GLUCONATE 0.12 % MT SOLN
OROMUCOSAL | Status: AC
  Filled 2023-11-13: qty 15

## 2023-11-13 MED ORDER — LIDOCAINE HCL (PF) 2 % IJ SOLN
INTRAMUSCULAR | Status: AC
Start: 2023-11-13 — End: ?
  Filled 2023-11-13: qty 5

## 2023-11-13 MED ORDER — ACETAMINOPHEN 500 MG PO TABS
1000.0000 mg | ORAL_TABLET | ORAL | Status: AC
  Administered 2023-11-13: 1000 mg via ORAL

## 2023-11-13 MED ORDER — ORAL CARE MOUTH RINSE: 15.0000 mL | Freq: Once | OROMUCOSAL | Status: AC

## 2023-11-13 MED ORDER — METHOCARBAMOL 500 MG PO TABS
500.0000 mg | ORAL_TABLET | Freq: Three times a day (TID) | ORAL | Status: DC | PRN
  Administered 2023-11-24: 500 mg via ORAL
  Filled 2023-11-13 (×2): qty 1

## 2023-11-13 MED ORDER — PROPOFOL 10 MG/ML IV BOLUS
INTRAVENOUS | Status: DC | PRN
  Administered 2023-11-13: 100 mg via INTRAVENOUS

## 2023-11-13 MED ORDER — SODIUM CHLORIDE (PF) 0.9 % IJ SOLN
INTRAMUSCULAR | Status: AC
  Filled 2023-11-13: qty 50

## 2023-11-13 MED ORDER — DEXMEDETOMIDINE HCL IN NACL 80 MCG/20ML IV SOLN
INTRAVENOUS | Status: DC | PRN
Start: 2023-11-13 — End: 2023-11-13
  Administered 2023-11-13 (×2): 4 ug via INTRAVENOUS

## 2023-11-13 MED ORDER — DIPHENHYDRAMINE HCL 50 MG/ML IJ SOLN: 12.5000 mg | Freq: Four times a day (QID) | INTRAMUSCULAR | Status: DC | PRN

## 2023-11-13 MED ORDER — SODIUM CHLORIDE 0.9 % IV SOLN
INTRAVENOUS | Status: AC
  Filled 2023-11-13: qty 2

## 2023-11-13 MED ORDER — SODIUM CHLORIDE 0.9 % IV SOLN: INTRAVENOUS | Status: AC

## 2023-11-13 MED ORDER — OXYCODONE HCL 5 MG PO TABS: 5.0000 mg | ORAL_TABLET | Freq: Once | ORAL | Status: DC | PRN

## 2023-11-13 MED ORDER — CHLORHEXIDINE GLUCONATE 0.12 % MT SOLN
15.0000 mL | Freq: Once | OROMUCOSAL | Status: AC
  Administered 2023-11-13: 15 mL via OROMUCOSAL

## 2023-11-13 MED ORDER — LOPERAMIDE HCL 2 MG PO CAPS
2.0000 mg | ORAL_CAPSULE | Freq: Three times a day (TID) | ORAL | Status: DC
  Administered 2023-11-13 – 2023-11-15 (×7): 2 mg via ORAL
  Filled 2023-11-13 (×8): qty 1

## 2023-11-13 MED ORDER — SUGAMMADEX SODIUM 200 MG/2ML IV SOLN
INTRAVENOUS | Status: DC | PRN
  Administered 2023-11-13: 120 mg via INTRAVENOUS

## 2023-11-13 MED ORDER — GABAPENTIN 300 MG PO CAPS
ORAL_CAPSULE | ORAL | Status: AC
  Filled 2023-11-13: qty 1

## 2023-11-13 SURGICAL SUPPLY — 53 items
ADHESIVE MASTISOL STRL (MISCELLANEOUS) IMPLANT
BARRIER ADH SEPRAFILM 3INX5IN (MISCELLANEOUS) IMPLANT
CHLORAPREP W/TINT 26 (MISCELLANEOUS) ×1 IMPLANT
COVER CLAMP SIL LG PBX B (MISCELLANEOUS) IMPLANT
DRAIN CHANNEL JP 19F RND 3/16 (MISCELLANEOUS) IMPLANT
DRAIN PENROSE 12X.25 LTX STRL (MISCELLANEOUS) IMPLANT
DRAPE LAPAROTOMY 100X77 ABD (DRAPES) ×1 IMPLANT
DRAPE UTILITY 15X26 TOWEL STRL (DRAPES) IMPLANT
DRAPE WARM FLUID 44X44 (DRAPES) IMPLANT
DRSG OPSITE POSTOP 4X12 (GAUZE/BANDAGES/DRESSINGS) IMPLANT
DRSG TEGADERM 4X4.75 (GAUZE/BANDAGES/DRESSINGS) IMPLANT
ELECT BLADE 6.5 EXT (BLADE) IMPLANT
ELECT CAUTERY BLADE 6.4 (BLADE) ×1 IMPLANT
ELECTRODE EZSTD 165MM 6.5IN (MISCELLANEOUS) ×1 IMPLANT
ELECTRODE REM PT RTRN 9FT ADLT (ELECTROSURGICAL) ×1 IMPLANT
EVACUATOR SILICONE 100CC (DRAIN) IMPLANT
GAUZE 4X4 16PLY ~~LOC~~+RFID DBL (SPONGE) ×1 IMPLANT
GAUZE SPONGE 4X4 12PLY STRL (GAUZE/BANDAGES/DRESSINGS) ×1 IMPLANT
GLOVE BIO SURGEON STRL SZ7 (GLOVE) ×2 IMPLANT
GOWN STRL REUS W/ TWL LRG LVL3 (GOWN DISPOSABLE) ×4 IMPLANT
KIT OSTOMY 2 PC DRNBL 2.25 STR (WOUND CARE) IMPLANT
KIT TURNOVER KIT A (KITS) ×1 IMPLANT
LABEL OR SOLS (LABEL) ×1 IMPLANT
LAVAGE JET IRRISEPT WOUND (IRRIGATION / IRRIGATOR) IMPLANT
LIGASURE IMPACT 36 18CM CVD LR (INSTRUMENTS) ×1 IMPLANT
MANIFOLD NEPTUNE II (INSTRUMENTS) ×1 IMPLANT
NDL HYPO 22X1.5 SAFETY MO (MISCELLANEOUS) ×1 IMPLANT
NEEDLE HYPO 22X1.5 SAFETY MO (MISCELLANEOUS) ×1 IMPLANT
NS IRRIG 1000ML POUR BTL (IV SOLUTION) ×1 IMPLANT
PACK BASIN MAJOR ARMC (MISCELLANEOUS) ×1 IMPLANT
PACK COLON CLEAN CLOSURE (MISCELLANEOUS) ×1 IMPLANT
RELOAD PROXIMATE 75MM BLUE (ENDOMECHANICALS) IMPLANT
RELOAD STAPLE 75 3.8 BLU REG (ENDOMECHANICALS) IMPLANT
SPIKE FLUID TRANSFER (MISCELLANEOUS) ×1 IMPLANT
SPONGE DRAIN TRACH 4X4 STRL 2S (GAUZE/BANDAGES/DRESSINGS) IMPLANT
SPONGE KITTNER 5P (MISCELLANEOUS) IMPLANT
SPONGE T-LAP 18X18 ~~LOC~~+RFID (SPONGE) ×5 IMPLANT
STAPLER CVD CUT BL 40 RELOAD (ENDOMECHANICALS) IMPLANT
STAPLER CVD CUT BLU 40 RELOAD (ENDOMECHANICALS) ×1 IMPLANT
STAPLER PROXIMATE 75MM BLUE (STAPLE) IMPLANT
STAPLER SKIN PROX 35W (STAPLE) ×1 IMPLANT
SUT ETHIBOND 0 MO6 C/R (SUTURE) IMPLANT
SUT PDS AB 0 CT1 27 (SUTURE) ×3 IMPLANT
SUT SILK 2 0SH CR/8 30 (SUTURE) ×1 IMPLANT
SUT SILK 2-0 18XBRD TIE 12 (SUTURE) ×1 IMPLANT
SUT VIC AB 3-0 SH 27X BRD (SUTURE) ×1 IMPLANT
SUTURE EHLN 3-0 FS-10 30 BLK (SUTURE) IMPLANT
SYR 20ML LL LF (SYRINGE) ×1 IMPLANT
SYR 30ML LL (SYRINGE) IMPLANT
TRAP FLUID SMOKE EVACUATOR (MISCELLANEOUS) ×1 IMPLANT
TRAY FOLEY MTR SLVR 16FR STAT (SET/KITS/TRAYS/PACK) ×1 IMPLANT
WATER STERILE IRR 1000ML POUR (IV SOLUTION) ×1 IMPLANT
WATER STERILE IRR 500ML POUR (IV SOLUTION) ×1 IMPLANT

## 2023-11-13 NOTE — Transfer of Care (Signed)
 Immediate Anesthesia Transfer of Care Note  Patient: Kathlyn Parcel  Procedure(s) Performed: REVISION, COLOSTOMY  Patient Location: PACU  Anesthesia Type:General  Level of Consciousness: awake, drowsy, and patient cooperative  Airway & Oxygen Therapy: Patient Spontanous Breathing and Patient connected to face mask oxygen  Post-op Assessment: Report given to RN and Post -op Vital signs reviewed and stable  Post vital signs: Reviewed and stable  Last Vitals:  Vitals Value Taken Time  BP 131/53 11/13/23 1303  Temp 36.9 C 11/13/23 1303  Pulse 79 11/13/23 1309  Resp 32 11/13/23 1309  SpO2 100 % 11/13/23 1309  Vitals shown include unfiled device data.  Last Pain:  Vitals:   11/13/23 1303  TempSrc:   PainSc: Asleep         Complications: No notable events documented.

## 2023-11-13 NOTE — Anesthesia Preprocedure Evaluation (Addendum)
 Anesthesia Evaluation  Patient identified by MRN, date of birth, ID band Patient awake    Reviewed: Allergy & Precautions, H&P , NPO status , Patient's Chart, lab work & pertinent test results, reviewed documented beta blocker date and time   Airway Mallampati: III  TM Distance: >3 FB Neck ROM: full    Dental  (+) Teeth Intact   Pulmonary shortness of breath and with exertion, neg sleep apnea, pneumonia, neg recent URI, Patient did not abstain from smoking.   Pulmonary exam normal        Cardiovascular Exercise Tolerance: Poor hypertension, On Medications Normal cardiovascular exam+ dysrhythmias  Rhythm:regular Rate:Normal  ECG 10/30/23: normal   Neuro/Psych  Headaches negative neurological ROS  negative psych ROS   GI/Hepatic negative GI ROS, Neg liver ROS,,,  Endo/Other  negative endocrine ROS    Renal/GU negative Renal ROS  negative genitourinary   Musculoskeletal  (+) Arthritis ,    Abdominal   Peds  Hematology  (+) Blood dyscrasia, anemia Breast CA   Anesthesia Other Findings Past Medical History: No date: Allergy No date: Anemia     Comment:  H/O AS A CHILD No date: Arthritis No date: Back pain 06/12/2015: Breast cancer of upper-outer quadrant of right female  breast (HCC)     Comment:  4 millimeter, T1a, N0; ER 90%, PR 0%, HER-2/neu not               overexpressed.  Declined radiation therapy. 05/28/2013: Collar bone fracture     Comment:  Fractured Collar bone on left No date: Dyspnea No date: Dysrhythmia     Comment:  h/o heart skipping beat years ago No date: Frequent headaches     Comment:  h/o migraines No date: Hypertension 04/2018: Osteopenia     Comment:  Bone density without significant interval change since               2017. No date: Pneumonia Past Surgical History: 1970: ABDOMINAL HYSTERECTOMY     Comment:  Partial 05/28/2015: BREAST BIOPSY; Right     Comment:  INVASIVE MAMMARY  CARCINOMA  06/12/2015: BREAST BIOPSY; Right     Comment:  Wide excision, sentinel lymph node biopsy. No date: BREAST EXCISIONAL BIOPSY; Right 06/12/2015: BREAST LUMPECTOMY WITH AXILLARY LYMPH NODE DISSECTION;  Right     Comment:  4 mm, T1a,N0 withDCIS with clear margins. ER: 90%, PR               0%; Her 2 neu not overexpressed.  02/03/2017: CATARACT EXTRACTION W/PHACO; Left     Comment:  Procedure: CATARACT EXTRACTION PHACO AND INTRAOCULAR               LENS PLACEMENT (IOC);  Surgeon: Clair Crews, MD;                Location: [facility];  Service: Ophthalmology;  Laterality:              Left;  US   00:28 AP% 16.3 CDE 4.68 Fluid pack lot #               [id number] 03/01/2019: CATARACT EXTRACTION W/PHACO; Right     Comment:  Procedure: CATARACT EXTRACTION PHACO AND INTRAOCULAR               LENS PLACEMENT (IOC)  RIGHT;  Surgeon: Clair Crews,              MD;  Location: [facility];  Service:  Ophthalmology;  Laterality: Right; No date: COLONOSCOPY 06/27/2022: COLONOSCOPY WITH PROPOFOL ; N/A     Comment:  Procedure: COLONOSCOPY WITH PROPOFOL ;  Surgeon: Marnee Sink, MD;  Location: [facility];  Service:               Endoscopy;  Laterality: N/A; 10/06/2023: COLONOSCOPY WITH PROPOFOL ; N/A     Comment:  Procedure: COLONOSCOPY WITH PROPOFOL ;  Surgeon: Marnee Sink, MD;  Location: [facility];  Service:               Endoscopy;  Laterality: N/A; 06/27/2022: ESOPHAGOGASTRODUODENOSCOPY (EGD) WITH PROPOFOL ; N/A     Comment:  Procedure: ESOPHAGOGASTRODUODENOSCOPY (EGD) WITH               PROPOFOL ;  Surgeon: Marnee Sink, MD;  Location: [facility];  Service: Endoscopy;  Laterality: N/A; 07/29/2022: PARTIAL COLECTOMY; N/A     Comment:  Procedure: PARTIAL COLECTOMY, open left, RNFA to assist;              Surgeon: Alben Alma, MD;  Location: [facility];                Service: General;  Laterality:  N/A; 10/06/2023: POLYPECTOMY     Comment:  Procedure: POLYPECTOMY;  Surgeon: Marnee Sink, MD;                Location: [facility];  Service: Endoscopy;; BMI    Body Mass Index: 25.31 kg/m     Reproductive/Obstetrics negative OB ROS                             Anesthesia Physical Anesthesia Plan  ASA: 3  Anesthesia Plan: General   Post-op Pain Management:    Induction:   PONV Risk Score and Plan: 4 or greater  Airway Management Planned:   Additional Equipment:   Intra-op Plan:   Post-operative Plan:   Informed Consent: I have reviewed the patients History and Physical, chart, labs and discussed the procedure including the risks, benefits and alternatives for the proposed anesthesia with the patient or authorized representative who has indicated his/her understanding and acceptance.     Dental Advisory Given  Plan Discussed with: CRNA  Anesthesia Plan Comments:         Anesthesia Quick Evaluation

## 2023-11-13 NOTE — Interval H&P Note (Signed)
 History and Physical Interval Note:  11/13/2023 9:03 AM  Kristin Miller  has presented today for surgery, with the diagnosis of Colostomy complication.  The various methods of treatment have been discussed with the patient and family. After consideration of risks, benefits and other options for treatment, the patient has consented to  Procedure(s): REVISION, COLOSTOMY (N/A) as a surgical intervention.  The patient's history has been reviewed, patient examined, no change in status, stable for surgery.  I have reviewed the patient's chart and labs.  Questions were answered to the patient's satisfaction.     [name]

## 2023-11-13 NOTE — Anesthesia Procedure Notes (Signed)
 Procedure Name: Intubation Date/Time: 11/13/2023 9:20 AM  Performed by: Orin Birk, CRNAPre-anesthesia Checklist: Patient identified, Emergency Drugs available, Suction available and Patient being monitored Patient Re-evaluated:Patient Re-evaluated prior to induction Oxygen Delivery Method: Circle system utilized Preoxygenation: Pre-oxygenation with 100% oxygen Induction Type: IV induction Ventilation: Mask ventilation without difficulty Laryngoscope Size: McGrath and 3 Grade View: Grade I Tube type: Oral Tube size: 6.5 mm Number of attempts: 1 Airway Equipment and Method: Stylet, Oral airway and Video-laryngoscopy Placement Confirmation: ETT inserted through vocal cords under direct vision, positive ETCO2 and breath sounds checked- equal and bilateral Secured at: 20 cm Tube secured with: Tape Dental Injury: Teeth and Oropharynx as per pre-operative assessment

## 2023-11-13 NOTE — Anesthesia Postprocedure Evaluation (Signed)
 Anesthesia Post Note  Patient: Kristin Miller  Procedure(s) Performed: REVISION, COLOSTOMY  Patient location during evaluation: PACU Anesthesia Type: General Level of consciousness: awake and alert, oriented and patient cooperative Pain management: pain level controlled Vital Signs Assessment: post-procedure vital signs reviewed and stable Respiratory status: spontaneous breathing, nonlabored ventilation and respiratory function stable Cardiovascular status: blood pressure returned to baseline and stable Postop Assessment: adequate PO intake Anesthetic complications: no   No notable events documented.   Last Vitals:  Vitals:   11/13/23 1345 11/13/23 1400  BP: (!) 120/55 (!) 124/55  Pulse: 82 81  Resp: 18 20  Temp:    SpO2: 97% 97%    Last Pain:  Vitals:   11/13/23 1400  TempSrc:   PainSc: 0-No pain                 Dorothey Gate

## 2023-11-13 NOTE — Op Note (Addendum)
 PROCEDURES: Revision of end colostomy requiring partial colectomy and resiting of the end colostomy to the right of the abdominal wall Repair of parastomal hernia   Pre-operative Diagnosis: Ostomy necrosis  Post-operative Diagnosis: same  Surgeon: Marcial Setting Chaia Ikard   Assistants: Dr. Severa Daniels MD  Anesthesia: General endotracheal anesthesia  ASA Class: 3   Surgeon: Evelia Hipp , MD FACS  Anesthesia: Gen. with endotracheal tube  Findings: Necrosis of end colostomy at the level of the fascia or just below it, not easy to tell given severe inflammatory response Thick and dense adhesions making this a very complicated case, We performed Enterolysis for at least 90 minutes and this was required to resore the complex anatomy   Estimated Blood Loss: 100cc         Drains: 19 blake x 2         Specimens: end colostomy                        Condition: stable  Procedure Details  The patient was seen again in the Holding Room. The benefits, complications, treatment options, and expected outcomes were discussed with the patient. The risks of bleeding, infection, recurrence of symptoms, failure to resolve symptoms,  bowel injury, any of which could require further surgery were reviewed with the patient.   The patient was taken to Operating Room, identified as Kristin Miller and the procedure verified.  A Time Out was held and the above information confirmed.  Prior to the induction of general anesthesia, antibiotic prophylaxis was administered. VTE prophylaxis was in place. General endotracheal anesthesia was then administered and tolerated well. After the induction, the abdomen was prepped with betadine  and draped in the sterile fashion. The patient was positioned in the supine position. I was able to remove the sutures surrounding the Mucor epithelial junction.  Please note that there was frank necrosis of the colostomy within the abdominal wall.  The sutures did not hold much.  This  was all this necrotic tissue that was dissected circumferentially.  Please also note that the colostomy tissue was very debilitated and rupture easily.  I have a significant degree of contamination due to this.  There was no way to avoid it.  I also needed to extend my incision and remove the prior sutures from the repair of the parastomal hernia.  I encountered severe and dense and extensive adhesions from the abdominal wall to the small bowel from the abdominal wall to the colostomy and from the abdominal wall to the small bowel and from the small bowel to the small bowel.  We were extremely careful tissues were very very friable.  The lysis of adhesion was done with a combination of scissors and also finger fracturing.  At this time I was able to ask my partner Dr. Cornel Diesel to scrub in due to the complexity of the case.  Dr. Cornel Diesel was fundamental and assisting with retraction creation of stoma, dissection as well as helping with surgical judgment throughout the case. We continued our extensive lysis of adhesions.  We observed that there was a group of plaster small bowel.  Upon further inspection we saw maybe a pinpoint hole with some enteric fluid.  Again the bowel was managed and was not able to be dissected.  I was able to place 2 figure-of-eight silk sutures in this pinpoint hole.  Seem to control the effluent of enteric contents.  Again around the small bowel and we found no evidence  of any injuries or leakage.  Attention then was turned to the greater omentum again the adhesions where extremely dense and horrible.  Using the LigaSure we were able to restore the anatomy and also dissect the omentum from the transverse colon.  Were able to mobilize the right colon as well as the hepatic flexure and divided the omentum from the transverse colon.  At this time we gained great mobility.  Also the white line of thought on the right colon was incised to mobilize the right colon as well.  On further inspection it  was obvious that there was a significant distal segment of colon that was necrotic and nonviable. I resited to recite the end colostomy to the right side.  I also resected the transverse colon where a piece of right colon was healthy viable and patent. Also note that we had to extend our incision to the right of the midline.  We continue with our transverse incision.  He had no other choice but to exteriorize our end colostomy VR R laparotomy incision.  We were able to repair prior parastomal hernia on the left side as well using multiple interrupted Ethibond sutures and the rest of the fascia was also closed with multiple interrupted Ethibond sutures.  Ensured that we were able to exteriorized the end of the colon outside of the abdominal wall. We also placed  19 Blake drains x 2 to the abdominal cavity. Liposomal Marcaine  was used to perform bilateral tap blocks. Were able to irrigate the abdominal cavity profusely.  Second look revealed no evidence of any bowel leaks or any bleeding Unable to close the skin over a Penrose with the staples.  The end colostomy was matured to the right side of the midline using multiple 3-0 Vicryl in the standard Brooke fashion.  The ostomy had a nice patent lumen and a nice pink mucosa.  Stoma appliance was placed. . Needle and laparotomy count were correct and there were no immediate occasions Please note that this was a very complicated case requiring 2 surgeons and also additional lysis of adhesions to restore the anatomy.  This definitely was labor-intensive and added significant an unusual time to the operation.  Evelia Hipp, MD, FACS

## 2023-11-13 NOTE — Plan of Care (Signed)
   Problem: Education: Goal: Knowledge of General Education information will improve Description: Including pain rating scale, medication(s)/side effects and non-pharmacologic comfort measures Outcome: Progressing   Problem: Nutrition: Goal: Adequate nutrition will be maintained Outcome: Progressing   Problem: Safety: Goal: Ability to remain free from injury will improve Outcome: Progressing

## 2023-11-14 ENCOUNTER — Other Ambulatory Visit: Payer: Self-pay

## 2023-11-14 ENCOUNTER — Encounter: Payer: Self-pay | Admitting: Surgery

## 2023-11-14 LAB — CBC
HCT: 30.8 % — ABNORMAL LOW (ref 36.0–46.0)
Hemoglobin: 10.2 g/dL — ABNORMAL LOW (ref 12.0–15.0)
MCH: 28.4 pg (ref 26.0–34.0)
MCHC: 33.1 g/dL (ref 30.0–36.0)
MCV: 85.8 fL (ref 80.0–100.0)
Platelets: 745 10*3/uL — ABNORMAL HIGH (ref 150–400)
RBC: 3.59 MIL/uL — ABNORMAL LOW (ref 3.87–5.11)
RDW: 14.2 % (ref 11.5–15.5)
WBC: 13.3 10*3/uL — ABNORMAL HIGH (ref 4.0–10.5)
nRBC: 0 % (ref 0.0–0.2)

## 2023-11-14 LAB — COMPREHENSIVE METABOLIC PANEL WITH GFR
ALT: 11 U/L (ref 0–44)
AST: 18 U/L (ref 15–41)
Albumin: 2.3 g/dL — ABNORMAL LOW (ref 3.5–5.0)
Alkaline Phosphatase: 37 U/L — ABNORMAL LOW (ref 38–126)
Anion gap: 8 (ref 5–15)
BUN: 9 mg/dL (ref 8–23)
CO2: 24 mmol/L (ref 22–32)
Calcium: 7.3 mg/dL — ABNORMAL LOW (ref 8.9–10.3)
Chloride: 106 mmol/L (ref 98–111)
Creatinine, Ser: 0.74 mg/dL (ref 0.44–1.00)
GFR, Estimated: >60 mL/min (ref >60)
Glucose, Bld: 134 mg/dL — ABNORMAL HIGH (ref 70–99)
Potassium: 4.2 mmol/L (ref 3.5–5.1)
Sodium: 138 mmol/L (ref 135–145)
Total Bilirubin: 0.5 mg/dL (ref 0.0–1.2)
Total Protein: 5.2 g/dL — ABNORMAL LOW (ref 6.5–8.1)

## 2023-11-14 LAB — PHOSPHORUS: Phosphorus: 2.3 mg/dL — ABNORMAL LOW (ref 2.5–4.6)

## 2023-11-14 LAB — MAGNESIUM: Magnesium: 1.7 mg/dL (ref 1.7–2.4)

## 2023-11-14 MED ORDER — MAGNESIUM SULFATE 2 GM/50ML IV SOLN
2.0000 g | Freq: Once | INTRAVENOUS | Status: AC
  Administered 2023-11-14: 2 g via INTRAVENOUS
  Filled 2023-11-14: qty 50

## 2023-11-14 MED ORDER — SODIUM CHLORIDE 0.9 % IV SOLN: INTRAVENOUS | Status: AC

## 2023-11-14 MED ORDER — SODIUM PHOSPHATES 45 MMOLE/15ML IV SOLN
15.0000 mmol | Freq: Once | INTRAVENOUS | Status: AC
  Administered 2023-11-14: 15 mmol via INTRAVENOUS
  Filled 2023-11-14: qty 5

## 2023-11-14 NOTE — Plan of Care (Signed)

## 2023-11-14 NOTE — Plan of Care (Signed)

## 2023-11-14 NOTE — Progress Notes (Signed)
 Pharmacy Consult for TPN for prolonged ileus  TPN Will be ordered on 4/27 to start 4/27 at 1800  POD 1:  Revision of end colostomy requiring partial colectomy and resiting of the end colostomy to the right of the abdominal wall Repair of parastomal hernia for prior stomal necrosis   On NS @ 100 ml/hr NPO Order for PICC has been placed  Phos 2.3    (K 4.2  Na 138) Will order Sodium Phosphate  15 mmol IV x 1  Mag 1.7  Will order Magnesium  sulfate 2 gm IV x1   [name] PharmD Clinical Pharmacist 11/14/2023

## 2023-11-14 NOTE — Progress Notes (Signed)
 [facility] SURGICAL PROGRESS NOTE  Hospital Day(s): 1.   Post op day(s): 1 Day Post-Op.   Interval History: Patient seen and examined, no acute events or new complaints overnight. Patient reports tolerating CLD well, and desires to trial FLD.  Up in chair, bright and w/o unanticipated issues.   Drains draining, and both bulbs fully distended.  The left side with gas and bile colored fluid and the right with just fluid.   Review of Systems:  Constitutional: denies fever, chills  Respiratory: denies any shortness of breath  Cardiovascular: denies chest pain or palpitations  Gastrointestinal: denies poorly controlled abdominal pain, N/V, or stomal output Musculoskeletal: denies pain, decreased motor or sensation Integumentary: denies any other rashes or skin discolorations  Vital signs in last 24 hours: [min-max] current  Temp:  [97.7 F (36.5 C)-98.5 F (36.9 C)] 98.1 F (36.7 C) (04/26 0743) Pulse Rate:  [75-86] 86 (04/26 0743) Resp:  [15-20] 15 (04/26 0743) BP: (97-139)/(44-63) 97/52 (04/26 0743) SpO2:  [95 %-100 %] 95 % (04/26 0743)     Height: 5' (152.4 cm) Weight: 58.8 kg BMI (Calculated): 25.31   Intake/Output last 2 shifts:  04/25 0701 - 04/26 0700 In: 3080 [P.O.:480; I.V.:1900; IV Piggyback:700] Out: 1715 [Urine:1375; Drains:240; Blood:100]   Physical Exam:  Constitutional: alert, cooperative and no distress  Respiratory: breathing non-labored at rest  Cardiovascular: regular rate, appears well perfused. Gastrointestinal: soft, non-tender, and non-distended, left sided Honeycomb with patches of SS drainage.      Right sided stoma edematous, pink, w/o output.   Drains bile tinged serous fluid as noted above.  Integumentary: w/o erythema.   Labs:     Latest Ref Rng & Units 11/14/2023    5:50 AM 11/09/2023   11:31 AM 10/28/2023    7:51 AM  CBC  WBC 4.0 - 10.5 K/uL 13.3  18.6  16.3   Hemoglobin 12.0 - 15.0 g/dL 19.1  47.8  29.5   Hematocrit 36.0 - 46.0  % 30.8  31.2  35.4   Platelets 150 - 400 K/uL 745  719  277       Latest Ref Rng & Units 11/14/2023    5:50 AM 11/09/2023   11:31 AM 10/28/2023    7:51 AM  CMP  Glucose 70 - 99 mg/dL 621  308  657   BUN 8 - 23 mg/dL 9  15  20    Creatinine 0.44 - 1.00 mg/dL 8.46  9.62  9.52   Sodium 135 - 145 mmol/L 138  137  133   Potassium 3.5 - 5.1 mmol/L 4.2  3.1  3.6   Chloride 98 - 111 mmol/L 106  98  105   CO2 22 - 32 mmol/L 24  28  21    Calcium 8.9 - 10.3 mg/dL 7.3  8.5  7.7   Total Protein 6.5 - 8.1 g/dL 5.2     Total Bilirubin 0.0 - 1.2 mg/dL 0.5     Alkaline Phos 38 - 126 U/L 37     AST 15 - 41 U/L 18     ALT 0 - 44 U/L 11        Imaging studies: No new pertinent imaging studies   Assessment/Plan:  88 y.o. female with  1 Day Post-Op s/p Revision of end colostomy requiring partial colectomy and resiting of the end colostomy to the right of the abdominal wall Repair of parastomal hernia for prior stomal necrosis, complicated by pertinent comorbidities including:  Patient Active Problem List  Diagnosis Date Noted   Parastomal hernia 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 gangrene 10/27/2023   Colon cancer (HCC) 10/27/2023   History of colon cancer 10/06/2023   Neoplasm of digestive system 10/06/2023   Age-related osteoporosis with current pathol fracture of vertebra, sequela 07/21/2023   Compression fracture of body of thoracic vertebra (HCC) 07/20/2023   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   Genetic testing 08/28/2022   Other constipation 08/14/2022   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   Rectal bleeding 08/04/2022   Gastroesophageal reflux disease with esophagitis 08/04/2022   GI bleeding 08/03/2022   SBO (small bowel obstruction) (HCC) 08/03/2022   Essential hypertension 08/03/2022   S/P partial resection of colon 07/29/2022   Malnutrition (HCC) 07/05/2022   Colon adenocarcinoma (HCC) 07/04/2022   Iron  deficiency anemia 06/27/2022   Primary open angle glaucoma (POAG) of both  eyes, moderate stage 05/14/2022   BP (high blood pressure) 11/15/2015   OP (osteoporosis) 11/15/2015   Primary cancer of lower-outer quadrant of right breast (HCC) 06/20/2015   Low back pain 08/02/2014   HTN (hypertension) 08/02/2014   Superficial basal cell carcinoma (BCC) of skin of left shoulder 07/31/2014   Abnormal finding on mammography 10/03/2013   Chronic bilateral low back pain without sciatica 03/29/2012    - Considering the output character of her drains, will stick with CLD for now.    - Will monitor output of drains with continued PO intake.   Increase vigilance of drain maintenance.   - Will continue IVF for now as well.    - Repeat labs in AM.    All of the above findings and recommendations were discussed with the patient, and all of patient's questions were answered to their expressed satisfaction.  -- Flynn Hylan, M.D., [facility] 11/14/2023

## 2023-11-15 ENCOUNTER — Other Ambulatory Visit: Payer: Self-pay

## 2023-11-15 LAB — GLUCOSE, CAPILLARY
Glucose-Capillary: 73 mg/dL (ref 70–99)
Glucose-Capillary: 78 mg/dL (ref 70–99)

## 2023-11-15 LAB — MAGNESIUM: Magnesium: 2.4 mg/dL (ref 1.7–2.4)

## 2023-11-15 LAB — COMPREHENSIVE METABOLIC PANEL WITH GFR
ALT: 8 U/L (ref 0–44)
AST: 15 U/L (ref 15–41)
Albumin: 1.9 g/dL — ABNORMAL LOW (ref 3.5–5.0)
Alkaline Phosphatase: 38 U/L (ref 38–126)
Anion gap: 7 (ref 5–15)
BUN: 10 mg/dL (ref 8–23)
CO2: 22 mmol/L (ref 22–32)
Calcium: 6.6 mg/dL — ABNORMAL LOW (ref 8.9–10.3)
Chloride: 105 mmol/L (ref 98–111)
Creatinine, Ser: 0.7 mg/dL (ref 0.44–1.00)
GFR, Estimated: >60 mL/min (ref >60)
Glucose, Bld: 93 mg/dL (ref 70–99)
Potassium: 4.2 mmol/L (ref 3.5–5.1)
Sodium: 134 mmol/L — ABNORMAL LOW (ref 135–145)
Total Bilirubin: 0.6 mg/dL (ref 0.0–1.2)
Total Protein: 4.5 g/dL — ABNORMAL LOW (ref 6.5–8.1)

## 2023-11-15 LAB — PHOSPHORUS: Phosphorus: 2.3 mg/dL — ABNORMAL LOW (ref 2.5–4.6)

## 2023-11-15 MED ORDER — INSULIN ASPART 100 UNIT/ML IJ SOLN
0.0000 [IU] | Freq: Three times a day (TID) | INTRAMUSCULAR | Status: DC
  Administered 2023-11-18: 1 [IU] via SUBCUTANEOUS
  Administered 2023-11-18: 2 [IU] via SUBCUTANEOUS
  Administered 2023-11-18: 1 [IU] via SUBCUTANEOUS
  Filled 2023-11-15 (×3): qty 1

## 2023-11-15 MED ORDER — THIAMINE HCL 100 MG/ML IJ SOLN
100.0000 mg | Freq: Once | INTRAMUSCULAR | Status: AC
  Administered 2023-11-15: 100 mg via INTRAVENOUS
  Filled 2023-11-15: qty 2

## 2023-11-15 MED ORDER — THIAMINE HCL 100 MG/ML IJ SOLN
100.0000 mg | INTRAMUSCULAR | Status: AC
  Administered 2023-11-16 – 2023-11-20 (×5): 100 mg via INTRAVENOUS
  Filled 2023-11-15 (×5): qty 2

## 2023-11-15 MED ORDER — OCTREOTIDE ACETATE 100 MCG/ML IJ SOLN
100.0000 ug | Freq: Three times a day (TID) | INTRAMUSCULAR | Status: DC
  Administered 2023-11-15 – 2023-11-16 (×3): 100 ug via SUBCUTANEOUS
  Filled 2023-11-15 (×5): qty 1

## 2023-11-15 MED ORDER — SODIUM CHLORIDE 0.9 % IV SOLN: INTRAVENOUS | Status: AC

## 2023-11-15 MED ORDER — TRACE MINERALS CU-MN-SE-ZN 300-55-60-3000 MCG/ML IV SOLN
INTRAVENOUS | Status: AC
  Filled 2023-11-15: qty 1000

## 2023-11-15 NOTE — Progress Notes (Signed)
 Initial Nutrition Assessment  DOCUMENTATION CODES:   Not applicable  INTERVENTION:  Order TPN per pharmacy   NUTRITION DIAGNOSIS:   Inadequate oral intake related to altered GI function as evidenced by NPO status.  GOAL:   Patient will meet greater than or equal to 90% of their needs  MONITOR:   Labs, Weight trends  REASON FOR ASSESSMENT:   Consult New TPN/TNA  ASSESSMENT:   Pt presents with colostomy complication s/p colectomy with ostomy from 10/27/23. PMH breast cancer, HTN, osteopenia.  4/25- Revision of end colostomy requiring partial colectomy and resiting of the end colostomy to the right of the abdominal wall and repair of parastomal hernia 4/26- TPN ordered  Medications reviewed and include: imodium , protonix , IVF 100 mL/hr  Labs reviewed: Sodium 134 L, Phos 2.3 L, Albumin  1.9 L   Intake/Output Summary (Last 24 hours) at 11/15/2023 1000 Last data filed at 11/15/2023 6578 Gross per 24 hour  Intake 480 ml  Output 1635 ml  Net -1155 ml    Diet Order:   Diet Order             Diet NPO time specified  Diet effective now                   EDUCATION NEEDS:   Not appropriate for education at this time  Skin:  Skin Assessment: Reviewed RN Assessment  Last BM:  4/24  Height:   Ht Readings from Last 1 Encounters:  11/13/23 5' (1.524 m)    Weight:   Wt Readings from Last 1 Encounters:  11/15/23 64.9 kg    Ideal Body Weight:     BMI:  Body mass index is 27.94 kg/m.  Estimated Nutritional Needs:   Kcal:  1700-1950  Protein:  80-100  Fluid:  >1.7 L/day or per MD  Laren Player, MPH, RD, LDN Clinical Dietitian Contact information can be found at [facility]

## 2023-11-15 NOTE — Progress Notes (Signed)
 [facility] SURGICAL PROGRESS NOTE  Hospital Day(s): 2.   Post op day(s): 2 Days Post-Op.   Interval History: Patient seen and examined, no acute events or new complaints overnight.  Disappointed she did not get her full liquid diet as planned.  Patient had tolerated CLD well.  In bed today, less bright.   Drains draining, and both bulbs fully distended.  Both drains with bilious fluid. Review of Systems:  Constitutional: denies fever, chills  Respiratory: denies any shortness of breath  Cardiovascular: denies chest pain or palpitations  Gastrointestinal: denies poorly controlled abdominal pain, N/V, or stomal output Musculoskeletal: denies pain, decreased motor or sensation Integumentary: denies any other rashes or skin discolorations  Vital signs in last 24 hours: [min-max] current  Temp:  [98.1 F (36.7 C)-98.9 F (37.2 C)] 98.6 F (37 C) (04/27 0747) Pulse Rate:  [86-92] 92 (04/27 0747) Resp:  [16-17] 17 (04/27 0747) BP: (98-115)/(49-56) 115/49 (04/27 0747) SpO2:  [93 %-100 %] 93 % (04/27 0747) Weight:  [64.9 kg] 64.9 kg (04/27 0500)     Height: 5' (152.4 cm) Weight: 64.9 kg BMI (Calculated): 27.94   Intake/Output last 2 shifts:  04/26 0701 - 04/27 0700 In: 960 [P.O.:960] Out: 1555 [Urine:600; Drains:955]   Physical Exam:  Constitutional: alert, cooperative and no distress  Respiratory: breathing non-labored at rest  Cardiovascular: regular rate, appears well perfused. Gastrointestinal: soft, non-tender, and non-distended, left sided Honeycomb with patches of SS drainage.      Right sided stoma edematous, darker pink, w/o output.   Drains bilious serous fluid.  Integumentary: w/o erythema.   Labs:     Latest Ref Rng & Units 11/14/2023    5:50 AM 11/09/2023   11:31 AM 10/28/2023    7:51 AM  CBC  WBC 4.0 - 10.5 K/uL 13.3  18.6  16.3   Hemoglobin 12.0 - 15.0 g/dL 16.1  09.6  04.5   Hematocrit 36.0 - 46.0 % 30.8  31.2  35.4   Platelets 150 - 400 K/uL 745   719  277       Latest Ref Rng & Units 11/15/2023    4:57 AM 11/14/2023    5:50 AM 11/09/2023   11:31 AM  CMP  Glucose 70 - 99 mg/dL 93  409  811   BUN 8 - 23 mg/dL 10  9  15    Creatinine 0.44 - 1.00 mg/dL 9.14  7.82  9.56   Sodium 135 - 145 mmol/L 134  138  137   Potassium 3.5 - 5.1 mmol/L 4.2  4.2  3.1   Chloride 98 - 111 mmol/L 105  106  98   CO2 22 - 32 mmol/L 22  24  28    Calcium 8.9 - 10.3 mg/dL 6.6  7.3  8.5   Total Protein 6.5 - 8.1 g/dL 4.5  5.2    Total Bilirubin 0.0 - 1.2 mg/dL 0.6  0.5    Alkaline Phos 38 - 126 U/L 38  37    AST 15 - 41 U/L 15  18    ALT 0 - 44 U/L 8  11       Imaging studies: No new pertinent imaging studies   Assessment/Plan:  88 y.o. female with  2 Days Post-Op s/p Revision of end colostomy requiring partial colectomy and resiting of the end colostomy to the right of the abdominal wall Repair of parastomal hernia for prior stomal necrosis, complicated by pertinent comorbidities including:  Patient Active Problem List  Diagnosis Date Noted   Parastomal hernia 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 gangrene 10/27/2023   Colon cancer (HCC) 10/27/2023   History of colon cancer 10/06/2023   Neoplasm of digestive system 10/06/2023   Age-related osteoporosis with current pathol fracture of vertebra, sequela 07/21/2023   Compression fracture of body of thoracic vertebra (HCC) 07/20/2023   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   Genetic testing 08/28/2022   Other constipation 08/14/2022   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   Rectal bleeding 08/04/2022   Gastroesophageal reflux disease with esophagitis 08/04/2022   GI bleeding 08/03/2022   SBO (small bowel obstruction) (HCC) 08/03/2022   Essential hypertension 08/03/2022   S/P partial resection of colon 07/29/2022   Malnutrition (HCC) 07/05/2022   Colon adenocarcinoma (HCC) 07/04/2022   Iron  deficiency anemia 06/27/2022   Primary open angle glaucoma (POAG) of both eyes, moderate stage 05/14/2022    BP (high blood pressure) 11/15/2015   OP (osteoporosis) 11/15/2015   Primary cancer of lower-outer quadrant of right breast (HCC) 06/20/2015   Low back pain 08/02/2014   HTN (hypertension) 08/02/2014   Superficial basal cell carcinoma (BCC) of skin of left shoulder 07/31/2014   Abnormal finding on mammography 10/03/2013   Chronic bilateral low back pain without sciatica 03/29/2012    - Considering the output character of her drains, will continue with n.p.o, initiate TPN once PICC line is placed.    - Will monitor output of drains, suspicious for small bowel content..   Increase vigilance of drain maintenance.  Will add octreotide, 100 mcg every 8 hours.  - Will continue IVF for now as well, and decrease rate TPN initiated.  - Repeat labs in AM.    All discussed with husband at bedside.  Questions answered to their satisfaction.  -- Flynn Hylan, M.D., [facility] 11/15/2023

## 2023-11-15 NOTE — Plan of Care (Signed)

## 2023-11-15 NOTE — Progress Notes (Addendum)
 Peripherally Inserted Central Catheter Placement  The IV Nurse has discussed with the patient and/or persons authorized to consent for the patient, the purpose of this procedure and the potential benefits and risks involved with this procedure.  The benefits include less needle sticks, lab draws from the catheter, and the patient may be discharged home with the catheter. Risks include, but not limited to, infection, bleeding, blood clot (thrombus formation), and puncture of an artery; nerve damage and irregular heartbeat and possibility to perform a PICC exchange if needed/ordered by physician.  Alternatives to this procedure were also discussed.  Bard Power PICC patient education guide, fact sheet on infection prevention and patient information card has been provided to patient /or left at bedside.    PICC Placement Documentation  PICC Double Lumen  (Active)   Unable to advance PICC beyond subclavian region. No difficulty accessing basilic vein with 7% catheter occupancy.  No other suitable veins noted. Secure chat sent to Dr Dana Duncan to notify. Recommend to refer to IR for PICC placement.   Allegra Arch 11/15/2023, 7:12 PM

## 2023-11-15 NOTE — Consult Note (Addendum)
 PHARMACY - TOTAL PARENTERAL NUTRITION CONSULT NOTE   Indication: Prolonged ileus  Patient Measurements: Height: 5' (152.4 cm) Weight: 64.9 kg (143 lb 1.3 oz) IBW/kg (Calculated) : 45.5 TPN AdjBW (KG): 48.8 Body mass index is 27.94 kg/m. Usual Weight: 64.9 kg  Assessment:  NE is a 88 yo female who presented with colostomy issues w separation of mucocutaneous junction and distal necrosis post parietal colectomy due to adenocarcinoma of colon. Previously colectomy was 4/8. They underwent revision of colostomy on 4/25. Pharmacy has been consulted to manage this patient's TPN.   Glucose / Insulin: 93, no insulin received in last 24 hours  Electrolytes:  K = 4.2; Mg = 2.4, Phos = 2.3 Renal: Scr 0.7 (at baseline)  Hepatic: WML Intake / Output; MIVF:  -485 Net Out NS @ 100 mL/hr  GI Imaging: 4/23: CT Abdomen Pelvis 1. Surgical changes from a left colectomy and left lower colostomy. The distal colostomy is abnormal. Beginning approximately 10 cm from the ostomy site the colon is very amorphous and is filled with fluid. There are abnormal gas collections around the bowel and there is a large collection of gas in the subcutaneous tissues. Findings very worrisome for necrosis of the distal 10 cm of colon. GI Surgeries / Procedures:  POD 2  -- Post Op s/p revision of end colostomy requiring partial colectomy and resiting of th eend of the colostomy to the right of abdominal wall  -- Repair of parastomal hernia for prior stomal necrosis, complicated by pertinent comorbid conditions    Central access: Order for PICC line placed TPN start date: 4/27 @ 1800  Nutritional Goals: Goal TPN rate is 42 mL/hr (provides 80 g of protein and 665 kcals per day)  RD Assessment: Estimated Needs Total Energy Estimated Needs: 1700-1950 Total Protein Estimated Needs: 80-100 Total Fluid Estimated Needs: >1.7 L/day or per MD  Current Nutrition:  NPO  Plan:  Start Clinimix-E 1Lat 42 mL/hr at  1800 Electrolytes in TPN: Na 35.28 mEq/L, K 30.45mEq/L, Ca 4.45 mEq/L, Mg 5.04 mEq/L, and Phos 15.12 mmol/L. Cl:Ac 0.49 Add standard MVI and trace elements to TPN Initiate Sensitive Q8H SSI and adjust as needed  Thiamine 100 mg IV daily x 5 days  Reduce MIVF to 80 mL/hr at 1800 Monitor TPN labs on Mon/Thurs, Triglycerides on Monday  Thank you for allowing pharmacy to participate in this patient's care.   Craven Do, PharmD Pharmacy Resident  11/15/2023 10:23 AM

## 2023-11-16 ENCOUNTER — Encounter: Admitting: Surgery

## 2023-11-16 DIAGNOSIS — K9400 Colostomy complication, unspecified: Secondary | ICD-10-CM | POA: Diagnosis not present

## 2023-11-16 LAB — GLUCOSE, CAPILLARY
Glucose-Capillary: 155 mg/dL — ABNORMAL HIGH (ref 70–99)
Glucose-Capillary: 64 mg/dL — ABNORMAL LOW (ref 70–99)
Glucose-Capillary: 108 mg/dL — ABNORMAL HIGH (ref 70–99)
Glucose-Capillary: 90 mg/dL (ref 70–99)
Glucose-Capillary: 81 mg/dL (ref 70–99)
Glucose-Capillary: 121 mg/dL — ABNORMAL HIGH (ref 70–99)

## 2023-11-16 LAB — COMPREHENSIVE METABOLIC PANEL WITH GFR
ALT: 9 U/L (ref 0–44)
AST: 13 U/L — ABNORMAL LOW (ref 15–41)
Albumin: 1.7 g/dL — ABNORMAL LOW (ref 3.5–5.0)
Alkaline Phosphatase: 44 U/L (ref 38–126)
Anion gap: 10 (ref 5–15)
BUN: 16 mg/dL (ref 8–23)
CO2: 22 mmol/L (ref 22–32)
Calcium: 6.6 mg/dL — ABNORMAL LOW (ref 8.9–10.3)
Chloride: 111 mmol/L (ref 98–111)
Creatinine, Ser: 0.72 mg/dL (ref 0.44–1.00)
GFR, Estimated: >60 mL/min (ref >60)
Glucose, Bld: 128 mg/dL — ABNORMAL HIGH (ref 70–99)
Potassium: 3.5 mmol/L (ref 3.5–5.1)
Sodium: 139 mmol/L (ref 135–145)
Total Bilirubin: 0.7 mg/dL (ref 0.0–1.2)
Total Protein: 4.7 g/dL — ABNORMAL LOW (ref 6.5–8.1)

## 2023-11-16 LAB — MAGNESIUM: Magnesium: 2.4 mg/dL (ref 1.7–2.4)

## 2023-11-16 LAB — TRIGLYCERIDES: Triglycerides: 74 mg/dL (ref <150)

## 2023-11-16 LAB — PHOSPHORUS: Phosphorus: 2 mg/dL — ABNORMAL LOW (ref 2.5–4.6)

## 2023-11-16 LAB — SURGICAL PATHOLOGY

## 2023-11-16 MED ORDER — FAT EMUL FISH OIL/PLANT BASED 20% (SMOFLIPID)IV EMUL
250.0000 mL | INTRAVENOUS | Status: AC
  Filled 2023-11-16: qty 250

## 2023-11-16 MED ORDER — LOPERAMIDE HCL 2 MG PO CAPS
4.0000 mg | ORAL_CAPSULE | Freq: Three times a day (TID) | ORAL | Status: DC
  Administered 2023-11-16: 4 mg via ORAL
  Filled 2023-11-16: qty 2

## 2023-11-16 MED ORDER — DEXTROSE 50 % IV SOLN
12.5000 g | INTRAVENOUS | Status: AC
  Administered 2023-11-16: 12.5 g via INTRAVENOUS
  Filled 2023-11-16: qty 50

## 2023-11-16 MED ORDER — FAT EMUL FISH OIL/PLANT BASED 20% (SMOFLIPID)IV EMUL: 250.0000 mL | INTRAVENOUS | Status: DC

## 2023-11-16 MED ORDER — TRACE MINERALS CU-MN-SE-ZN 300-55-60-3000 MCG/ML IV SOLN
INTRAVENOUS | Status: DC
  Filled 2023-11-16: qty 1000

## 2023-11-16 MED ORDER — ORAL CARE MOUTH RINSE: 15.0000 mL | OROMUCOSAL | Status: DC | PRN

## 2023-11-16 MED ORDER — LOPERAMIDE HCL 2 MG PO CAPS
4.0000 mg | ORAL_CAPSULE | Freq: Four times a day (QID) | ORAL | Status: DC
  Administered 2023-11-16 – 2023-12-15 (×107): 4 mg via ORAL
  Filled 2023-11-16 (×109): qty 2

## 2023-11-16 MED ORDER — OCTREOTIDE ACETATE 100 MCG/ML IJ SOLN
200.0000 ug | Freq: Three times a day (TID) | INTRAMUSCULAR | Status: DC
  Administered 2023-11-16 – 2023-11-28 (×34): 200 ug via SUBCUTANEOUS
  Filled 2023-11-16 (×38): qty 2

## 2023-11-16 MED ORDER — POTASSIUM PHOSPHATES 15 MMOLE/5ML IV SOLN
30.0000 mmol | Freq: Once | INTRAVENOUS | Status: AC
  Administered 2023-11-16: 30 mmol via INTRAVENOUS
  Filled 2023-11-16: qty 10

## 2023-11-16 MED ORDER — TRACE MINERALS CU-MN-SE-ZN 300-55-60-3000 MCG/ML IV SOLN: INTRAVENOUS | Status: DC

## 2023-11-16 NOTE — Plan of Care (Signed)
   Problem: Education: Goal: Knowledge of General Education information will improve Description Including pain rating scale, medication(s)/side effects and non-pharmacologic comfort measures Outcome: Progressing   Problem: Activity: Goal: Risk for activity intolerance will decrease Outcome: Progressing   Problem: Safety: Goal: Ability to remain free from injury will improve Outcome: Progressing

## 2023-11-16 NOTE — Care Management Important Message (Signed)
 Important Message  Patient Details  Name: Kristin Miller MRN: [id number] Date of Birth: 04-28-1936   Important Message Given:  Yes - Medicare IM     Archana Eckman W, CMA 11/16/2023, 11:31 AM

## 2023-11-16 NOTE — Plan of Care (Signed)
  Problem: Nutrition: Goal: Adequate nutrition will be maintained Outcome: Not Progressing  Patient received a dose of D50 to correct hypoglycemia, awaiting response from provider about switching to dextrose  IV fluids as patient is still NPO. TPN could not be started last night d/t IV team not being able to get a PICC line in.  Problem: Education: Goal: Knowledge of General Education information will improve Description: Including pain rating scale, medication(s)/side effects and non-pharmacologic comfort measures Outcome: Progressing   Problem: Health Behavior/Discharge Planning: Goal: Ability to manage health-related needs will improve Outcome: Progressing   Problem: Clinical Measurements: Goal: Ability to maintain clinical measurements within normal limits will improve Outcome: Progressing Goal: Will remain free from infection Outcome: Progressing Goal: Diagnostic test results will improve Outcome: Progressing Goal: Respiratory complications will improve Outcome: Progressing Goal: Cardiovascular complication will be avoided Outcome: Progressing   Problem: Activity: Goal: Risk for activity intolerance will decrease Outcome: Progressing   Problem: Coping: Goal: Level of anxiety will decrease Outcome: Progressing   Problem: Elimination: Goal: Will not experience complications related to bowel motility Outcome: Progressing Goal: Will not experience complications related to urinary retention Outcome: Progressing   Problem: Pain Managment: Goal: General experience of comfort will improve and/or be controlled Outcome: Progressing   Problem: Safety: Goal: Ability to remain free from injury will improve Outcome: Progressing   Problem: Skin Integrity: Goal: Risk for impaired skin integrity will decrease Outcome: Progressing

## 2023-11-16 NOTE — Consult Note (Signed)
 PHARMACY - TOTAL PARENTERAL NUTRITION CONSULT NOTE   Indication: Prolonged ileus  Patient Measurements: Height: 5' (152.4 cm) Weight: 59.4 kg (130 lb 15.3 oz) IBW/kg (Calculated) : 45.5 TPN AdjBW (KG): 48.8 Body mass index is 25.58 kg/m. Usual Weight: 64.9 kg  Assessment:  NE is a 88 yo female who presented with colostomy issues w separation of mucocutaneous junction and distal necrosis post parietal colectomy due to adenocarcinoma of colon. Previously colectomy was 4/8. They underwent revision of colostomy on 4/25. Pharmacy has been consulted to manage this patient's TPN.   Glucose / Insulin: BG 64-128 sSSI q8h  no insulin received in last 24 hours  Electrolytes:  Phos 2.0  Will order Potassium phosphate 30 mmol IV x 1 Renal: Scr 0.72 (at baseline)  Hepatic: WML TG: 74 Intake / Output; MIVF:  NS @ 80 mL/hr  GI Imaging: 4/23: CT Abdomen Pelvis 1. Surgical changes from a left colectomy and left lower colostomy. The distal colostomy is abnormal. Beginning approximately 10 cm from the ostomy site the colon is very amorphous and is filled with fluid. There are abnormal gas collections around the bowel and there is a large collection of gas in the subcutaneous tissues. Findings very worrisome for necrosis of the distal 10 cm of colon. GI Surgeries / Procedures:  POD 2  -- Post Op s/p revision of end colostomy requiring partial colectomy and resiting of th eend of the colostomy to the right of abdominal wall  -- Repair of parastomal hernia for prior stomal necrosis, complicated by pertinent comorbid conditions    Central access: Order for PICC line placed TPN start date: not started yet- TPN could not be started last night 4/27 d/t IV team not being able to get a PICC line in.   Nutritional Goals: Premix Clinimix E 1000 ml x 6 days/2000 ml x 1 day  + Lipids 211ml/day at 42 mL/hr (provides 91 g of protein and 1258 kcals per day)  RD Assessment: Estimated Needs Total Energy  Estimated Needs: 1700-1950 Total Protein Estimated Needs: 80-100 Total Fluid Estimated Needs: >1.7 L/day or per MD  Current Nutrition:  NPO  Plan:  Will order premix Clinimix-E 1000ml at 42 mL/hr at 1800 if PICC placed Will order Lipids 20% 250 ml/day x 12 hrs daily Electrolytes in TPN: Na 35.28 mEq/L, K 30.33mEq/L, Ca 4.45 mEq/L, Mg 5.04 mEq/L, and Phos 15.12 mmol/L. Cl:Ac 0.49 -Phos 2.0  Will order Potassium phosphate 30 mmol IV x 1 -low Phos, refeeding risk -Corrected Calcium=8.4   (Ca 6.6  Alb 1.7) Add standard MVI and trace elements to TPN continue Sensitive Q8H SSI and adjust as needed  Thiamine 100 mg IV daily x 5 days   MIVF at 80 mL/hr until TPN started Monitor TPN labs on Mon/Thurs, Triglycerides on Mondays  Thank you for allowing pharmacy to participate in this patient's care.   Kristin Miller PharmD Clinical Pharmacist 11/16/2023

## 2023-11-16 NOTE — Progress Notes (Addendum)
 [facility] SURGICAL PROGRESS NOTE  Hospital Day(s): 3.   Post op day(s): 3 Days Post-Op.   Interval History:  Patient seen and examined No acute events or new complaints overnight.  Patient reports she is feeling weak Abdominal soreness if not laying flat No fever, chills, nausea, emesis  Renal function normal; sCr - 0.72; UO - 350 ccs Mild hypophosphatemia to 2.0 Drains as follows...  - LUQ: 330 ccs; bilious  - RUQ: 150 ccs; bilious  She is NPO She is on Unasyn    Vital signs in last 24 hours: [min-max] current  Temp:  [97.8 F (36.6 C)-98.6 F (37 C)] 97.8 F (36.6 C) (04/28 0441) Pulse Rate:  [67-92] 67 (04/28 0441) Resp:  [17-18] 18 (04/28 0441) BP: (99-115)/(47-51) 109/51 (04/28 0441) SpO2:  [90 %-93 %] 92 % (04/28 0441) Weight:  [59.4 kg] 59.4 kg (04/28 0450)     Height: 5' (152.4 cm) Weight: 59.4 kg BMI (Calculated): 25.58   Intake/Output last 2 shifts:  04/27 0701 - 04/28 0700 In: 1582.7 [I.V.:1279.2; IV Piggyback:303.5] Out: 830 [Urine:350; Drains:480]   Physical Exam:  Constitutional: alert, cooperative and no distress  Respiratory: breathing non-labored at rest  Cardiovascular: regular rate and sinus rhythm  Gastrointestinal: soft, incisional soreness, and non-distended, drains in RUQ, LUQ both bilious vs sero-feculent. Colostomy now in right abdomen, pink, no gas or stool Integumentary: Left sided abdominal incisions is CDI with staples, penrose, and honeycomb inb place  Labs:     Latest Ref Rng & Units 11/14/2023    5:50 AM 11/09/2023   11:31 AM 10/28/2023    7:51 AM  CBC  WBC 4.0 - 10.5 K/uL 13.3  18.6  16.3   Hemoglobin 12.0 - 15.0 g/dL 69.6  29.5  28.4   Hematocrit 36.0 - 46.0 % 30.8  31.2  35.4   Platelets 150 - 400 K/uL 745  719  277       Latest Ref Rng & Units 11/16/2023    6:08 AM 11/15/2023    4:57 AM 11/14/2023    5:50 AM  CMP  Glucose 70 - 99 mg/dL 132  93  440   BUN 8 - 23 mg/dL 16  10  9    Creatinine 0.44 - 1.00 mg/dL  1.02  7.25  3.66   Sodium 135 - 145 mmol/L 139  134  138   Potassium 3.5 - 5.1 mmol/L 3.5  4.2  4.2   Chloride 98 - 111 mmol/L 111  105  106   CO2 22 - 32 mmol/L 22  22  24    Calcium 8.9 - 10.3 mg/dL 6.6  6.6  7.3   Total Protein 6.5 - 8.1 g/dL 4.7  4.5  5.2   Total Bilirubin 0.0 - 1.2 mg/dL 0.7  0.6  0.5   Alkaline Phos 38 - 126 U/L 44  38  37   AST 15 - 41 U/L 13  15  18    ALT 0 - 44 U/L 9  8  11       Imaging studies: No new pertinent imaging studies   Assessment/Plan:  88 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 3 Days Post-Op s/p revision of end colostomy, partial colectomy, and repair of parastomal hernia for necrosis of previous end colostomy   - Discussed with IR regarding IV team issues with PICC placement; They are agreeable with proceeding  - She will need TPN; pharmacy aware  - Continue strict NPO  - Octreotide 100 mcg q8  - No  need for surgical re-intervention currently   - At some point, will need CT Abdomen/Pelvis with PO contrast in attempt to localize this fistula/anatomy   - Continue surgical drains; monitor and record output strictly   - Monitor abdominal examination - Monitor ostomy output - engage WOC  - Pain control prn; antiemetics prn   - Monitor nutritional labs   - Mobilize as feasible  - Will transfer to 2C   - Spent a significant amount of time this AM with patient and her husband reviewing concerns for fistula and outline treatment plan. Anticipate this will be lengthy process. Continue conservative and supportive care.   All of the above findings and recommendations were discussed with the patient, patient's family(husband at bedside), and the medical team, and all of patient's and family's questions were answered to their expressed satisfaction.  -- Apolonio Bay, PA-C [facility] 11/16/2023, 7:26 AM M-F: 7am - 4pm

## 2023-11-17 ENCOUNTER — Inpatient Hospital Stay: Admitting: Radiology

## 2023-11-17 LAB — BASIC METABOLIC PANEL WITH GFR
Anion gap: 9 (ref 5–15)
BUN: 14 mg/dL (ref 8–23)
CO2: 21 mmol/L — ABNORMAL LOW (ref 22–32)
Calcium: 6.8 mg/dL — ABNORMAL LOW (ref 8.9–10.3)
Chloride: 109 mmol/L (ref 98–111)
Creatinine, Ser: 0.58 mg/dL (ref 0.44–1.00)
GFR, Estimated: >60 mL/min (ref >60)
Glucose, Bld: 96 mg/dL (ref 70–99)
Potassium: 3.6 mmol/L (ref 3.5–5.1)
Sodium: 139 mmol/L (ref 135–145)

## 2023-11-17 LAB — GLUCOSE, CAPILLARY
Glucose-Capillary: 104 mg/dL — ABNORMAL HIGH (ref 70–99)
Glucose-Capillary: 128 mg/dL — ABNORMAL HIGH (ref 70–99)
Glucose-Capillary: 62 mg/dL — ABNORMAL LOW (ref 70–99)
Glucose-Capillary: 76 mg/dL (ref 70–99)
Glucose-Capillary: 81 mg/dL (ref 70–99)
Glucose-Capillary: 89 mg/dL (ref 70–99)

## 2023-11-17 LAB — PHOSPHORUS: Phosphorus: 2.4 mg/dL — ABNORMAL LOW (ref 2.5–4.6)

## 2023-11-17 LAB — MAGNESIUM: Magnesium: 2.6 mg/dL — ABNORMAL HIGH (ref 1.7–2.4)

## 2023-11-17 MED ORDER — LIDOCAINE HCL 1 % IJ SOLN
10.0000 mL | Freq: Once | INTRAMUSCULAR | Status: AC
  Administered 2023-11-17: 2 mL via INTRADERMAL

## 2023-11-17 MED ORDER — LIDOCAINE HCL 1 % IJ SOLN
INTRAMUSCULAR | Status: AC
  Filled 2023-11-17: qty 20

## 2023-11-17 MED ORDER — TRAVASOL 10 % IV SOLN
INTRAVENOUS | Status: AC
  Filled 2023-11-17: qty 528

## 2023-11-17 MED ORDER — LACTATED RINGERS IV SOLN: INTRAVENOUS | Status: AC

## 2023-11-17 MED ORDER — LATANOPROST 0.005 % OP SOLN
1.0000 [drp] | Freq: Every day | OPHTHALMIC | Status: DC
  Administered 2023-11-17 – 2023-12-14 (×28): 1 [drp] via OPHTHALMIC
  Filled 2023-11-17 (×4): qty 2.5

## 2023-11-17 NOTE — TOC Initial Note (Signed)
 Transition of Care [facility]) - Initial/Assessment Note    Patient Details  Name: Kristin Miller MRN: [id number] Date of Birth: [birth year]  Transition of Care [facility]) CM/SW Contact:    Loman Risk, RN Phone Number: 11/17/2023, 11:22 AM  Clinical Narrative:                   Admitted for:EC fistula 4 Days Post-Op s/p revision of end colostomy, partial colectomy, and repair of parastomal hernia for necrosis of previous end colostomy  Currently receiving TPN Admitted from: home with husband PCP: Ziglar Current home health/prior home health/DME: RW  Recent discharge patient declined home health services as she was familiar with ostomy care.  Please consult TOC should needs arise       Patient Goals and CMS Choice            Expected Discharge Plan and Services                                              Prior Living Arrangements/Services                       Activities of Daily Living   ADL Screening (condition at time of admission) Independently performs ADLs?: Yes (appropriate for developmental age) Is the patient deaf or have difficulty hearing?: Yes Does the patient have difficulty seeing, even when wearing glasses/contacts?: No Does the patient have difficulty concentrating, remembering, or making decisions?: Yes  Permission Sought/Granted                  Emotional Assessment              Admission diagnosis:  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] Colon cancer (HCC) [C18.9] Patient Active Problem List   Diagnosis Date Noted   Parastomal hernia 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 gangrene 10/27/2023   Colon cancer (HCC) 10/27/2023   History of colon cancer 10/06/2023   Neoplasm of digestive system 10/06/2023   Age-related osteoporosis with current pathol fracture of vertebra, sequela 07/21/2023   Compression fracture of body of thoracic vertebra (HCC) 07/20/2023   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   Genetic testing  08/28/2022   Other constipation 08/14/2022   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   Rectal bleeding 08/04/2022   Gastroesophageal reflux disease with esophagitis 08/04/2022   GI bleeding 08/03/2022   SBO (small bowel obstruction) (HCC) 08/03/2022   Essential hypertension 08/03/2022   S/P partial resection of colon 07/29/2022   Malnutrition (HCC) 07/05/2022   Colon adenocarcinoma (HCC) 07/04/2022   Iron  deficiency anemia 06/27/2022   Primary open angle glaucoma (POAG) of both eyes, moderate stage 05/14/2022   BP (high blood pressure) 11/15/2015   OP (osteoporosis) 11/15/2015   Primary cancer of lower-outer quadrant of right breast (HCC) 06/20/2015   Low back pain 08/02/2014   HTN (hypertension) 08/02/2014   Superficial basal cell carcinoma (BCC) of skin of left shoulder 07/31/2014   Abnormal finding on mammography 10/03/2013   Chronic bilateral low back pain without sciatica 03/29/2012   PCP:  Ziglar, Susan K, MD Pharmacy:   [facility] [address],  - [address] [place] [address] Phone: [phone] Fax: [phone]     Social Drivers of Health (SDOH) Social History: SDOH Screenings   Food Insecurity: No Food Insecurity (11/14/2023)  Housing: Low Risk  (  11/14/2023)  Transportation Needs: No Transportation Needs (11/14/2023)  Utilities: Not At Risk (11/14/2023)  Alcohol Screen: Low Risk  (09/25/2021)  Depression (PHQ2-9): Low Risk  (08/25/2023)  Financial Resource Strain: Low Risk  (01/09/2023)   Received from [facility], [facility]  Physical Activity: Inactive (09/25/2021)  Social Connections: Moderately Integrated (11/14/2023)  Stress: No Stress Concern Present (09/25/2021)  Tobacco Use: Low Risk  (11/13/2023)   SDOH Interventions:     Readmission Risk Interventions     No data to display

## 2023-11-17 NOTE — Progress Notes (Addendum)
 [facility] SURGICAL PROGRESS NOTE  Hospital Day(s): 4.   Post op day(s): 4 Days Post-Op.   Interval History:  Patient seen and examined No acute events or new complaints overnight.  Patient reports she is "hanging in there" No fever, chills, nausea, emesis  Renal function normal; sCr - 0.58; UO - unmeasured Mild hypophosphatemia to 2.4 Drains as follows...  - LUQ: 158 ccs; bilious  - RUQ: 287 ccs; bilious  She is NPO She is on Unasyn   TPN ordered but not started; she has yet to get PICC  Vital signs in last 24 hours: [min-max] current  Temp:  [97.8 F (36.6 C)-98.5 F (36.9 C)] 97.8 F (36.6 C) (04/29 0354) Pulse Rate:  [66-79] 70 (04/29 0354) Resp:  [16-20] 20 (04/29 0354) BP: (99-105)/(38-52) 99/44 (04/29 0354) SpO2:  [90 %-95 %] 92 % (04/29 0354)     Height: 5' (152.4 cm) Weight: 59.4 kg BMI (Calculated): 25.58   Intake/Output last 2 shifts:  04/28 0701 - 04/29 0700 In: 395.1 [I.V.:298.4; IV Piggyback:96.8] Out: 445 [Drains:445]   Physical Exam:  Constitutional: alert, cooperative and no distress  Respiratory: breathing non-labored at rest  Cardiovascular: regular rate and sinus rhythm  Gastrointestinal: soft, incisional soreness, and non-distended, drains in RUQ, LUQ both bilious. Colostomy now in right abdomen, pink, no gas or stool Integumentary: Left sided abdominal incisions is CDI with staples, penrose, and honeycomb inb place  Labs:     Latest Ref Rng & Units 11/14/2023    5:50 AM 11/09/2023   11:31 AM 10/28/2023    7:51 AM  CBC  WBC 4.0 - 10.5 K/uL 13.3  18.6  16.3   Hemoglobin 12.0 - 15.0 g/dL 96.0  45.4  09.8   Hematocrit 36.0 - 46.0 % 30.8  31.2  35.4   Platelets 150 - 400 K/uL 745  719  277       Latest Ref Rng & Units 11/17/2023    4:39 AM 11/16/2023    6:08 AM 11/15/2023    4:57 AM  CMP  Glucose 70 - 99 mg/dL 96  119  93   BUN 8 - 23 mg/dL 14  16  10    Creatinine 0.44 - 1.00 mg/dL 1.47  8.29  5.62   Sodium 135 - 145 mmol/L  139  139  134   Potassium 3.5 - 5.1 mmol/L 3.6  3.5  4.2   Chloride 98 - 111 mmol/L 109  111  105   CO2 22 - 32 mmol/L 21  22  22    Calcium 8.9 - 10.3 mg/dL 6.8  6.6  6.6   Total Protein 6.5 - 8.1 g/dL  4.7  4.5   Total Bilirubin 0.0 - 1.2 mg/dL  0.7  0.6   Alkaline Phos 38 - 126 U/L  44  38   AST 15 - 41 U/L  13  15   ALT 0 - 44 U/L  9  8      Imaging studies: No new pertinent imaging studies   Assessment/Plan:  88 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 4 Days Post-Op s/p revision of end colostomy, partial colectomy, and repair of parastomal hernia for necrosis of previous end colostomy   - Will touch base with IR again regarding PICC placement; discussed on 04/28 - awaiting placement   - She will need TPN; pharmacy aware  - Continue strict NPO  - Octreotide 100 mcg q8  - No need for surgical re-intervention currently   - At some  point, will need CT Abdomen/Pelvis with IV contrast only in attempt to localize this fistula/anatomy - Ordered for tomorrow (04/30) AM  - Continue surgical drains; monitor and record output strictly   - Monitor abdominal examination - Monitor ostomy output - engage WOC  - Pain control prn; antiemetics prn   - Monitor nutritional labs   - Mobilize as feasible   - Spent a significant amount of time this AM with patient and her husband reviewing concerns for fistula and outline treatment plan. Anticipate this will be lengthy process. Continue conservative and supportive care.   All of the above findings and recommendations were discussed with the patient, patient's family (husband at bedside), and the medical team, and all of patient's and family's questions were answered to their expressed satisfaction.  -- Apolonio Bay, PA-C [facility] 11/17/2023, 7:25 AM M-F: 7am - 4pm

## 2023-11-17 NOTE — Procedures (Signed)
 Left brachial vein dual lumen PICC placed without immediate complications. Length 38 cm tip SVC / right atrial junction. Okay to use. Medication used - 1% lidocaine . EBL < 2 ml.

## 2023-11-17 NOTE — Plan of Care (Signed)
  Problem: Pain Managment: Goal: General experience of comfort will improve and/or be controlled Outcome: Progressing   Problem: Safety: Goal: Ability to remain free from injury will improve Outcome: Progressing   Problem: Skin Integrity: Goal: Risk for impaired skin integrity will decrease Outcome: Progressing

## 2023-11-17 NOTE — Consult Note (Signed)
 PHARMACY - TOTAL PARENTERAL NUTRITION CONSULT NOTE   Indication: Prolonged ileus  Patient Measurements: Height: 5' (152.4 cm) Weight: 59.4 kg (130 lb 15.3 oz) IBW/kg (Calculated) : 45.5 TPN AdjBW (KG): 48.8 Body mass index is 25.58 kg/m. Usual Weight: 64.9  Assessment:  NE is a 88 yo female who presented with colostomy issues w separation of mucocutaneous junction and distal necrosis post parietal colectomy due to adenocarcinoma of colon. Previously colectomy was 4/8. They underwent revision of colostomy on 4/25. Pharmacy has been consulted to manage this patient's TPN.   Glucose / Insulin: BG 81-121 No insulin in past 24 hours Electrolytes:  Phos = 2.4 (will hold additional replacement at this time) Mg = 2.6 (reduce Mg in TPN) Renal: Scr < 1 Hepatic: WNL Intake / Output; MIVF: No MIVF at this time  Intake/Output Summary (Last 24 hours) at 11/17/2023 0826 Last data filed at 11/17/2023 0507 Gross per 24 hour  Intake 395.13 ml  Output 413 ml  Net -17.87 ml    GI Imaging: 4/23: CT Abdomen Pelvis 1. Surgical changes from a left colectomy and left lower colostomy. The distal colostomy is abnormal. Beginning approximately 10 cm from the ostomy site the colon is very amorphous and is filled with fluid. There are abnormal gas collections around the bowel and there is a large collection of gas in the subcutaneous tissues. Findings very worrisome for necrosis of the distal 10 cm of colon. 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, complicated by pertinent comorbid conditions    Central access: Order for IR to place PICC TPN start date: 4/29  Nutritional Goals: Goal TPN rate is 75 mL/hr (provides 99 g of protein and 1792.8 kcals per day)  RD Assessment: Estimated Needs Total Energy Estimated Needs:  1700-1950 Total Protein Estimated Needs: 80-100 Total Fluid Estimated Needs: >1.7 L/day or per MD  Current Nutrition:  NPO  Plan:  Start TPN at 40 mL/hr at 1800 Electrolytes in TPN: Na 67mEq/L, K 19mEq/L, Ca 58mEq/L, Mg 2 mEq/L (Reduced 4/29), and Phos 42mmol/L. Cl:Ac 1:1 Add standard MVI and trace elements to TPN Initiate Sensitive q8h SSI and adjust as needed  Thiamine 100 mg IV daily x 5 days per dietary No MVIF at this time Monitor TPN labs on Mon/Thurs, daily until stable  Ramonita Burow, PharmD 11/17/2023,8:24 AM

## 2023-11-18 ENCOUNTER — Inpatient Hospital Stay

## 2023-11-18 ENCOUNTER — Encounter: Admitting: Surgery

## 2023-11-18 LAB — COMPREHENSIVE METABOLIC PANEL WITH GFR
ALT: 9 U/L (ref 0–44)
AST: 16 U/L (ref 15–41)
Albumin: 1.7 g/dL — ABNORMAL LOW (ref 3.5–5.0)
Alkaline Phosphatase: 52 U/L (ref 38–126)
Anion gap: 7 (ref 5–15)
BUN: 15 mg/dL (ref 8–23)
CO2: 24 mmol/L (ref 22–32)
Calcium: 7 mg/dL — ABNORMAL LOW (ref 8.9–10.3)
Chloride: 110 mmol/L (ref 98–111)
Creatinine, Ser: 0.61 mg/dL (ref 0.44–1.00)
GFR, Estimated: >60 mL/min (ref >60)
Glucose, Bld: 153 mg/dL — ABNORMAL HIGH (ref 70–99)
Potassium: 3.5 mmol/L (ref 3.5–5.1)
Sodium: 141 mmol/L (ref 135–145)
Total Bilirubin: 0.3 mg/dL (ref 0.0–1.2)
Total Protein: 4.9 g/dL — ABNORMAL LOW (ref 6.5–8.1)

## 2023-11-18 LAB — GLUCOSE, CAPILLARY
Glucose-Capillary: 127 mg/dL — ABNORMAL HIGH (ref 70–99)
Glucose-Capillary: 146 mg/dL — ABNORMAL HIGH (ref 70–99)
Glucose-Capillary: 147 mg/dL — ABNORMAL HIGH (ref 70–99)
Glucose-Capillary: 149 mg/dL — ABNORMAL HIGH (ref 70–99)
Glucose-Capillary: 165 mg/dL — ABNORMAL HIGH (ref 70–99)
Glucose-Capillary: 99 mg/dL (ref 70–99)

## 2023-11-18 LAB — PHOSPHORUS: Phosphorus: 2.2 mg/dL — ABNORMAL LOW (ref 2.5–4.6)

## 2023-11-18 LAB — MAGNESIUM: Magnesium: 2.4 mg/dL (ref 1.7–2.4)

## 2023-11-18 MED ORDER — TRAVASOL 10 % IV SOLN
INTRAVENOUS | Status: AC
  Filled 2023-11-18: qty 528

## 2023-11-18 MED ORDER — IOHEXOL 300 MG/ML  SOLN
100.0000 mL | Freq: Once | INTRAMUSCULAR | Status: AC | PRN
  Administered 2023-11-18: 100 mL via INTRAVENOUS

## 2023-11-18 MED ORDER — FLUCONAZOLE IN SODIUM CHLORIDE 200-0.9 MG/100ML-% IV SOLN
200.0000 mg | INTRAVENOUS | Status: AC
  Administered 2023-11-18 – 2023-12-04 (×17): 200 mg via INTRAVENOUS
  Filled 2023-11-18 (×17): qty 100

## 2023-11-18 NOTE — Progress Notes (Signed)
 Nutrition Follow-up  DOCUMENTATION CODES:   Not applicable  INTERVENTION:   Continue TPN per pharmacy- currently at 1/2 rate  Thiamine 100mg  IV daily x 5-7 days  Recommend chromium 10mcg daily added to TPN   Pt at high refeed risk; recommend monitor potassium, magnesium  and phosphorus labs daily until stable  Daily weights   NUTRITION DIAGNOSIS:   Inadequate oral intake related to altered GI function as evidenced by NPO status. -ongoing   GOAL:   Patient will meet greater than or equal to 90% of their needs -met   MONITOR:   Diet advancement, Labs, Weight trends, Skin, I & O's, Other (Comment) (TPN)  REASON FOR ASSESSMENT:   Consult New TPN/TNA  ASSESSMENT:   88 y/o female with h/o HTN, breast cancer s/p lumpectomy/XRT, IDA, HTN, chronic back pain, colon cancer (s/p open low anterior resection with end colostomy, abdominal wall resection to include psoas and left transversus abdominus muscles and left oophorectomy 07/2022 & s/p XRT) complicated by SBO (medically managed 07/2022) and flank hernia (s/p robotic assisted laparoscopic left colectomy w/ end colostomy, repair parastomal hernia and repair enterotomy 10/27/2023) and ostomy necrosis s/p revision of end colostomy requiring partial colectomy and resiting of the end colostomy to the right of the abdominal wall & repair of parastomal hernia 2/25 complicated by fluid collections and 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  Met with pt in room today. Pt reports good appetite and oral intake at baseline. Pt has been drinking chocolate fairlife shakes at home. Pt reports that she is feeling better. Pt NPO but has been eating some ice chips. Pt tolerating TPN at 1/2 rate. Pt is refeeding; electrolytes being monitored and supplemented by pharmacy. RD discussed with pt the importance of adequate nutrition needed to preserve lean muscle and support post op healing. Pt is agreeable to drinking chocolate or strawberry supplements with diet advancement.  Per chart, pt is up ~14lbs from her UBW. Spoke with pharmacy, will concentrate TPN. Recommend addition of chromium in TPN as pt with possible fistula.    Medications reviewed and include: insulin, imodium , octroetide, protonix , thiamine, unasyn , diflucan , TPN  Labs reviewed: K 3.5 wnl, P 2.2(L), Mg 2.4 wnl Wbc- 13.3(H), Hgb 10.2(L), Hct 30.8(L) Cbgs- 127, 146, 147, 149, 165 x 24 hrs   Drains as follows...             - LUQ: 70 ccs; bilious             - RUQ: 355 ccs; bilious   NUTRITION - FOCUSED PHYSICAL EXAM:  Flowsheet Row Most Recent Value  Orbital Region No depletion  Upper Arm Region No depletion  Thoracic and Lumbar Region No depletion  Buccal Region No depletion  Temple Region No depletion  Clavicle Bone Region No depletion  Clavicle and Acromion Bone Region No depletion  Scapular Bone Region No depletion  Dorsal Hand Mild depletion  Patellar Region Mild depletion  Anterior Thigh Region No depletion  Posterior Calf Region Mild depletion  Edema (RD Assessment) None  Hair Reviewed  Eyes Reviewed  Mouth Reviewed  Skin Reviewed  Nails Reviewed   Diet Order:   Diet Order             Diet NPO time specified Except for: Ice Chips, Sips with Meds  Diet effective now                  EDUCATION NEEDS:   Education needs have been addressed  Skin:  Skin Assessment: Reviewed RN Assessment  Last  BM:  4/24  Height:   Ht Readings from Last 1 Encounters:  11/13/23 5' (1.524 m)    Weight:   Wt Readings from Last 1 Encounters:  11/18/23 65.3 kg   BMI:  Body mass index is 28.12 kg/m.  Estimated Nutritional Needs:   Kcal:  1600-1800kcal/day  Protein:  80-90g/day  Fluid:  1.4-1.6L/day  Torrance Freestone MS, RD, LDN If unable to be reached, please send secure chat to "RD inpatient" available from 8:00a-4:00p daily

## 2023-11-18 NOTE — Progress Notes (Signed)
 Mobility Specialist - Progress Note   11/18/23 1500  Mobility  Activity Ambulated with assistance in room;Ambulated with assistance to bathroom  Level of Assistance Standby assist, set-up cues, supervision of patient - no hands on  Assistive Device Front wheel walker  Distance Ambulated (ft) 25 ft  Activity Response Tolerated well  Mobility visit 1 Mobility     Pt ambulated to bathroom with minG. Completed bed mobility with supervision + extra time. No LOB. Pt returned to bed with needs in reach. Family at bedside.    Searcy Czech Mobility Specialist 11/18/23, 3:30 PM

## 2023-11-18 NOTE — Progress Notes (Addendum)
 [facility] SURGICAL PROGRESS NOTE  Hospital Day(s): 5.   Post op day(s): 5 Days Post-Op.   Interval History:  Patient seen and examined No acute events or new complaints overnight.  Patient reports she is feeling a little better this AM Abdomen is sore but controlled  No fever, chills, nausea, emesis  Renal function normal; sCr - 0.61; UO - unmeasured Mild hypophosphatemia to 2.2 Drains as follows...  - LUQ: 70 ccs; bilious  - RUQ: 355 ccs; bilious  She is NPO She is on Unasyn   TPN   Vital signs in last 24 hours: [min-max] current  Temp:  [97.7 F (36.5 C)-99.3 F (37.4 C)] 97.7 F (36.5 C) (04/30 0405) Pulse Rate:  [0-99] 75 (04/30 0405) Resp:  [11-26] 20 (04/30 0405) BP: (110-145)/(49-94) 134/59 (04/30 0405) SpO2:  [92 %-100 %] 93 % (04/30 0405) Weight:  [65.3 kg] 65.3 kg (04/30 0500)     Height: 5' (152.4 cm) Weight: 65.3 kg BMI (Calculated): 28.12   Intake/Output last 2 shifts:  04/29 0701 - 04/30 0700 In: 2126.2 [I.V.:1326.2; IV Piggyback:800] Out: 425 [Drains:425]   Physical Exam:  Constitutional: alert, cooperative and no distress  Respiratory: breathing non-labored at rest  Cardiovascular: regular rate and sinus rhythm  Gastrointestinal: soft, incisional soreness, and non-distended, drains in RUQ, LUQ both bilious. Colostomy now in right abdomen, pink, no gas or stool Integumentary: Left sided abdominal incisions is CDI with staples, penrose  Labs:     Latest Ref Rng & Units 11/14/2023    5:50 AM 11/09/2023   11:31 AM 10/28/2023    7:51 AM  CBC  WBC 4.0 - 10.5 K/uL 13.3  18.6  16.3   Hemoglobin 12.0 - 15.0 g/dL 16.1  09.6  04.5   Hematocrit 36.0 - 46.0 % 30.8  31.2  35.4   Platelets 150 - 400 K/uL 745  719  277       Latest Ref Rng & Units 11/18/2023    4:22 AM 11/17/2023    4:39 AM 11/16/2023    6:08 AM  CMP  Glucose 70 - 99 mg/dL 409  96  811   BUN 8 - 23 mg/dL 15  14  16    Creatinine 0.44 - 1.00 mg/dL 9.14  7.82  9.56   Sodium  135 - 145 mmol/L 141  139  139   Potassium 3.5 - 5.1 mmol/L 3.5  3.6  3.5   Chloride 98 - 111 mmol/L 110  109  111   CO2 22 - 32 mmol/L 24  21  22    Calcium 8.9 - 10.3 mg/dL 7.0  6.8  6.6   Total Protein 6.5 - 8.1 g/dL 4.9   4.7   Total Bilirubin 0.0 - 1.2 mg/dL 0.3   0.7   Alkaline Phos 38 - 126 U/L 52   44   AST 15 - 41 U/L 16   13   ALT 0 - 44 U/L 9   9      Imaging studies:   CT Abdomen/Pelvis (11/18/2023) personally reviewed, there is air/fluid in anterior abdomen which appears controlled by drains, scant fluid around liver/spleen, gallbladder appears distended, and radiologist report reviewed below:  IMPRESSION: Large sliding-type hiatal hernia. Small bilateral pleural effusions with adjacent subsegmental atelectasis.   Status post revision of colostomy, with new colostomy seen in right lower quadrant. Two large surgical drains are present as described above.   Multiple fluid collections are noted throughout the peritoneal space as noted above. This includes  8.4 x 4.2 cm fluid collection or abscess along the greater curvature of the stomach. 6.6 x 2.2 cm ill-defined fluid collection is developing in left iliac fossa. 4.2 x 2.0 cm fluid collection is noted anteriorly in left upper quadrant. 4.8 x 2.5 cm fluid collection is seen laterally in right upper quadrant. These are concerning for possible abscesses.   Also noted is a 0.6 x 3.8 cm collection of contrast and probable stool seen in right lower quadrant in pelvis; is uncertain if this represents dilated colon or potentially extravasated material; attention on follow-up imaging is recommended.   Assessment/Plan:  88 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 5 Days Post-Op s/p revision of end colostomy, partial colectomy, and repair of parastomal hernia for necrosis of previous end colostomy   - I will review CT with IR to determine if need for additional drainage/aspiration indicated/feasible   - Continue TPN;  advance to goal - monitor electrolytes   - Continue strict NPO  - Octreotide 100 mcg q8  - No need for surgical re-intervention currently   - Continue surgical drains; monitor and record output strictly   - Monitor abdominal examination - Monitor ostomy output - engage WOC  - Pain control prn; antiemetics prn   - Monitor nutritional labs   - Mobilize as feasible   - Spent a significant amount of time this AM with patient and her husband reviewing concerns for fistula and outline treatment plan. Anticipate this will be lengthy process. Continue conservative and supportive care.   All of the above findings and recommendations were discussed with the patient, patient's family (husband at bedside), and the medical team, and all of patient's and family's questions were answered to their expressed satisfaction.  -- Apolonio Bay, PA-C [facility] 11/18/2023, 7:21 AM M-F: 7am - 4pm

## 2023-11-18 NOTE — Consult Note (Signed)
 PHARMACY - TOTAL PARENTERAL NUTRITION CONSULT NOTE   Indication: Prolonged ileus  Patient Measurements: Height: 5' (152.4 cm) Weight: 65.3 kg (143 lb 15.4 oz) IBW/kg (Calculated) : 45.5 TPN AdjBW (KG): 48.8 Body mass index is 28.12 kg/m. Usual Weight: 64.9  Assessment:  NE is a 88 yo female who presented with colostomy issues w separation of mucocutaneous junction and distal necrosis post parietal colectomy due to adenocarcinoma of colon. Previously colectomy was 4/8. They underwent revision of colostomy on 4/25. Pharmacy has been consulted to manage this patient's TPN.   Glucose / Insulin: BG 149-153. 2 units of novolog in the last 24 hours.  Electrolytes:  Phos = 2.4 > 2.2 (will hold additional replacement at this time) Mg = 2.6 > 2.4 (reduce Mg in TPN) Renal: Scr < 1 Hepatic: WNL Intake / Output; MIVF: No MIVF at this time  Intake/Output Summary (Last 24 hours) at 11/18/2023 0752 Last data filed at 11/18/2023 0622 Gross per 24 hour  Intake 2126.16 ml  Output 425 ml  Net 1701.16 ml    GI Imaging: 4/23: CT Abdomen Pelvis 1. Surgical changes from a left colectomy and left lower colostomy. The distal colostomy is abnormal. Beginning approximately 10 cm from the ostomy site the colon is very amorphous and is filled with fluid. There are abnormal gas collections around the bowel and there is a large collection of gas in the subcutaneous tissues. Findings very worrisome for necrosis of the distal 10 cm of colon. 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, complicated by pertinent comorbid conditions    Central access: Order for IR to place PICC TPN start date: 4/29  Nutritional Goals: Goal TPN rate is 75 mL/hr (provides 99 g of protein and 1792.8 kcals per day)  RD Assessment: Estimated  Needs Total Energy Estimated Needs: 1600-1800kcal/day Total Protein Estimated Needs: 80-90g/day Total Fluid Estimated Needs: 1.4-1.6L/day  Current Nutrition:  NPO  Plan:  Continue TPN at 40 mL/hr at 1800 Electrolytes in TPN: Na 50mEq/L, K 50mEq/L, Ca 66mEq/L, Mg 2 mEq/L (Reduced 4/29), and Phos 15 > 20 mmol/L. Cl:Ac 1:1 Add standard MVI and trace elements to TPN Initiate Sensitive q8h SSI and adjust as needed  Thiamine 100 mg IV daily x 5 days per dietary No MVIF at this time Monitor TPN labs on Mon/Thurs, daily until stable  Trinidad Funk, PharmD 11/18/2023,7:52 AM

## 2023-11-18 NOTE — Plan of Care (Signed)
  Problem: Education: Goal: Knowledge of General Education information will improve Description: Including pain rating scale, medication(s)/side effects and non-pharmacologic comfort measures 11/18/2023 1540 by Jeanie Miller R, RN Outcome: Progressing 11/18/2023 1539 by Franciso Irwin, RN Outcome: Progressing   Problem: Health Behavior/Discharge Planning: Goal: Ability to manage health-related needs will improve Outcome: Progressing   Problem: Clinical Measurements: Goal: Ability to maintain clinical measurements within normal limits will improve Outcome: Progressing Goal: Will remain free from infection Outcome: Progressing Goal: Diagnostic test results will improve Outcome: Progressing Goal: Respiratory complications will improve Outcome: Progressing Goal: Cardiovascular complication will be avoided Outcome: Progressing   Problem: Activity: Goal: Risk for activity intolerance will decrease Outcome: Progressing   Problem: Nutrition: Goal: Adequate nutrition will be maintained Outcome: Progressing   Problem: Coping: Goal: Level of anxiety will decrease Outcome: Progressing   Problem: Elimination: Goal: Will not experience complications related to bowel motility Outcome: Progressing Goal: Will not experience complications related to urinary retention Outcome: Progressing   Problem: Pain Managment: Goal: General experience of comfort will improve and/or be controlled Outcome: Progressing   Problem: Safety: Goal: Ability to remain free from injury will improve Outcome: Progressing   Problem: Skin Integrity: Goal: Risk for impaired skin integrity will decrease Outcome: Progressing

## 2023-11-18 NOTE — Plan of Care (Signed)
 Midline incision draining serous fluid,same reinforced with dry dressing. Problem: Health Behavior/Discharge Planning: Goal: Ability to manage health-related needs will improve Outcome: Progressing   Problem: Clinical Measurements: Goal: Ability to maintain clinical measurements within normal limits will improve Outcome: Progressing Goal: Will remain free from infection Outcome: Progressing Goal: Diagnostic test results will improve Outcome: Progressing Goal: Respiratory complications will improve Outcome: Progressing Goal: Cardiovascular complication will be avoided Outcome: Progressing

## 2023-11-19 ENCOUNTER — Inpatient Hospital Stay: Attending: Oncology | Admitting: Oncology

## 2023-11-19 LAB — GLUCOSE, CAPILLARY
Glucose-Capillary: 154 mg/dL — ABNORMAL HIGH (ref 70–99)
Glucose-Capillary: 132 mg/dL — ABNORMAL HIGH (ref 70–99)
Glucose-Capillary: 148 mg/dL — ABNORMAL HIGH (ref 70–99)
Glucose-Capillary: 139 mg/dL — ABNORMAL HIGH (ref 70–99)
Glucose-Capillary: 129 mg/dL — ABNORMAL HIGH (ref 70–99)
Glucose-Capillary: 134 mg/dL — ABNORMAL HIGH (ref 70–99)

## 2023-11-19 LAB — COMPREHENSIVE METABOLIC PANEL WITH GFR
ALT: 9 U/L (ref 0–44)
AST: 19 U/L (ref 15–41)
Albumin: 1.7 g/dL — ABNORMAL LOW (ref 3.5–5.0)
Alkaline Phosphatase: 63 U/L (ref 38–126)
Anion gap: 11 (ref 5–15)
BUN: 15 mg/dL (ref 8–23)
CO2: 22 mmol/L (ref 22–32)
Calcium: 7.3 mg/dL — ABNORMAL LOW (ref 8.9–10.3)
Chloride: 109 mmol/L (ref 98–111)
Creatinine, Ser: 0.65 mg/dL (ref 0.44–1.00)
GFR, Estimated: >60 mL/min (ref >60)
Glucose, Bld: 122 mg/dL — ABNORMAL HIGH (ref 70–99)
Potassium: 4 mmol/L (ref 3.5–5.1)
Sodium: 142 mmol/L (ref 135–145)
Total Bilirubin: 0.3 mg/dL (ref 0.0–1.2)
Total Protein: 5.3 g/dL — ABNORMAL LOW (ref 6.5–8.1)

## 2023-11-19 LAB — MAGNESIUM: Magnesium: 2.1 mg/dL (ref 1.7–2.4)

## 2023-11-19 LAB — TRIGLYCERIDES: Triglycerides: 130 mg/dL (ref <150)

## 2023-11-19 LAB — PHOSPHORUS: Phosphorus: 2.6 mg/dL (ref 2.5–4.6)

## 2023-11-19 MED ORDER — TRAVASOL 10 % IV SOLN
INTRAVENOUS | Status: AC
  Filled 2023-11-19: qty 889.2

## 2023-11-19 MED ORDER — ALBUMIN HUMAN 25 % IV SOLN
25.0000 g | Freq: Once | INTRAVENOUS | Status: AC
  Administered 2023-11-19: 25 g via INTRAVENOUS
  Filled 2023-11-19: qty 100

## 2023-11-19 MED ORDER — INSULIN ASPART 100 UNIT/ML IJ SOLN
0.0000 [IU] | Freq: Four times a day (QID) | INTRAMUSCULAR | Status: DC
  Administered 2023-11-19 (×2): 1 [IU] via SUBCUTANEOUS
  Administered 2023-11-20: 2 [IU] via SUBCUTANEOUS
  Administered 2023-11-20 (×2): 1 [IU] via SUBCUTANEOUS
  Administered 2023-11-21 (×3): 2 [IU] via SUBCUTANEOUS
  Administered 2023-11-22: 1 [IU] via SUBCUTANEOUS
  Administered 2023-11-22 – 2023-11-24 (×8): 2 [IU] via SUBCUTANEOUS
  Administered 2023-11-24: 1 [IU] via SUBCUTANEOUS
  Administered 2023-11-24 – 2023-11-25 (×5): 2 [IU] via SUBCUTANEOUS
  Administered 2023-11-26 (×2): 1 [IU] via SUBCUTANEOUS
  Administered 2023-11-26 – 2023-11-27 (×2): 2 [IU] via SUBCUTANEOUS
  Administered 2023-11-27: 1 [IU] via SUBCUTANEOUS
  Administered 2023-11-27: 2 [IU] via SUBCUTANEOUS
  Administered 2023-11-27: 1 [IU] via SUBCUTANEOUS
  Administered 2023-11-28 (×2): 2 [IU] via SUBCUTANEOUS
  Administered 2023-11-28 – 2023-11-30 (×7): 1 [IU] via SUBCUTANEOUS
  Administered 2023-11-30: 2 [IU] via SUBCUTANEOUS
  Administered 2023-11-30 – 2023-12-01 (×3): 1 [IU] via SUBCUTANEOUS
  Administered 2023-12-01: 2 [IU] via SUBCUTANEOUS
  Administered 2023-12-01: 1 [IU] via SUBCUTANEOUS
  Administered 2023-12-02: 2 [IU] via SUBCUTANEOUS
  Administered 2023-12-02: 1 [IU] via SUBCUTANEOUS
  Administered 2023-12-02: 2 [IU] via SUBCUTANEOUS
  Administered 2023-12-02 – 2023-12-04 (×2): 1 [IU] via SUBCUTANEOUS
  Administered 2023-12-04: 2 [IU] via SUBCUTANEOUS
  Administered 2023-12-05: 1 [IU] via SUBCUTANEOUS
  Administered 2023-12-05: 2 [IU] via SUBCUTANEOUS
  Administered 2023-12-05: 1 [IU] via SUBCUTANEOUS
  Administered 2023-12-06: 2 [IU] via SUBCUTANEOUS
  Administered 2023-12-06 – 2023-12-07 (×5): 1 [IU] via SUBCUTANEOUS
  Administered 2023-12-08: 2 [IU] via SUBCUTANEOUS
  Administered 2023-12-08: 1 [IU] via SUBCUTANEOUS
  Administered 2023-12-08 – 2023-12-09 (×2): 2 [IU] via SUBCUTANEOUS
  Administered 2023-12-09 – 2023-12-14 (×5): 1 [IU] via SUBCUTANEOUS
  Filled 2023-11-19 (×69): qty 1

## 2023-11-19 NOTE — Progress Notes (Signed)
 CC: EC Fistula  Subjective: DOing ok but had low grade temp 200cc total output  Objective: Vital signs in last 24 hours: Temp:  [98.1 F (36.7 C)-100.6 F (38.1 C)] 98.2 F (36.8 C) (05/01 1430) Pulse Rate:  [83-94] 94 (05/01 1430) Resp:  [16-20] 18 (05/01 1430) BP: (122-146)/(58-74) 122/74 (05/01 1430) SpO2:  [88 %-94 %] 94 % (05/01 1430) Last BM Date : 11/17/23  Intake/Output from previous day: 04/30 0701 - 05/01 0700 In: 731.7 [I.V.:415; IV Piggyback:316.7] Out: 200 [Drains:200] Intake/Output this shift: Total I/O In: -  Out: 140 [Drains:140]  Physical exam:  NAD alert Abd: soft, no peritonitis, ostomy patent w some liquid. Bilious output from both drains holding suction  Lab Results: CBC  No results for input(s): "WBC", "HGB", "HCT", "PLT" in the last 72 hours. BMET Recent Labs    11/18/23 0422 11/19/23 0516  NA 141 142  K 3.5 4.0  CL 110 109  CO2 24 22  GLUCOSE 153* 122*  BUN 15 15  CREATININE 0.61 0.65  CALCIUM 7.0* 7.3*   PT/INR No results for input(s): "LABPROT", "INR" in the last 72 hours. ABG No results for input(s): "PHART", "HCO3" in the last 72 hours.  Invalid input(s): "PCO2", "PO2"  Studies/Results: CT ABDOMEN PELVIS W CONTRAST Result Date: 11/18/2023 CLINICAL DATA:  Postoperative status. History of enterocutaneous fistula. EXAM: CT ABDOMEN AND PELVIS WITH CONTRAST TECHNIQUE: Multidetector CT imaging of the abdomen and pelvis was performed using the standard protocol following bolus administration of intravenous contrast. RADIATION DOSE REDUCTION: This exam was performed according to the departmental dose-optimization program which includes automated exposure control, adjustment of the mA and/or kV according to patient size and/or use of iterative reconstruction technique. CONTRAST:  OMNIPAQUE  IOHEXOL  300 MG/ML  SOLN COMPARISON:  November 11, 2023. FINDINGS: Lower chest: Small bilateral pleural effusions are noted with adjacent subsegmental  atelectasis. Large hiatal hernia is again noted. Hepatobiliary: No cholelithiasis or biliary dilatation is noted. Stable right hepatic cyst. Pancreas: Unremarkable. No pancreatic ductal dilatation or surrounding inflammatory changes. Spleen: Normal in size without focal abnormality. Adrenals/Urinary Tract: Adrenal glands are unremarkable. Kidneys are normal, without renal calculi, focal lesion, or hydronephrosis. Bladder is unremarkable. Stomach/Bowel: No small bowel dilatation is noted. There has been colostomy revision with new colostomy placed in right lower quadrant. Two surgical drains are noted, 1 with distal tip in left lower quadrant and the other in right upper quadrant. Vascular/Lymphatic: Aortic atherosclerosis. No enlarged abdominal or pelvic lymph nodes. Reproductive: Status post hysterectomy. No adnexal masses. Other: Pneumoperitoneum is noted consistent with recent surgery. 8.4 x 4.2 cm fluid collection is noted along the greater curvature of the stomach concerning for possible abscess. 6.6 x 2.2 cm ill-defined fluid collection is developing in the left iliac fossa. 4.2 x 2.0 cm fluid collection is seen anteriorly in left upper quadrant. 4.8 x 2.5 cm fluid collection is seen laterally in right upper quadrant. 8.6 x 3.8 cm collection is noted in right lower quadrant and pelvis which contains both contrast and stool. It is uncertain if this represents lumen or extravasation. Musculoskeletal: Stable old T12 fracture. No acute osseous abnormality is noted. IMPRESSION: Large sliding-type hiatal hernia. Small bilateral pleural effusions with adjacent subsegmental atelectasis. Status post revision of colostomy, with new colostomy seen in right lower quadrant. Two large surgical drains are present as described above. Multiple fluid collections are noted throughout the peritoneal space as noted above. This includes 8.4 x 4.2 cm fluid collection or abscess along the greater curvature of  the stomach. 6.6 x 2.2  cm ill-defined fluid collection is developing in left iliac fossa. 4.2 x 2.0 cm fluid collection is noted anteriorly in left upper quadrant. 4.8 x 2.5 cm fluid collection is seen laterally in right upper quadrant. These are concerning for possible abscesses. Also noted is a 0.6 x 3.8 cm collection of contrast and probable stool seen in right lower quadrant in pelvis; is uncertain if this represents dilated colon or potentially extravasated material; attention on follow-up imaging is recommended. Aortic Atherosclerosis (ICD10-I70.0). Electronically Signed   By: Rosalene Colon M.D.   On: 11/18/2023 08:38    Anti-infectives: Anti-infectives (From admission, onward)    Start     Dose/Rate Route Frequency Ordered Stop   11/18/23 1015  fluconazole  (DIFLUCAN ) IVPB 200 mg        200 mg 100 mL/hr over 60 Minutes Intravenous Every 24 hours 11/18/23 0930     11/13/23 1700  Ampicillin -Sulbactam (UNASYN ) 3 g in sodium chloride  0.9 % 100 mL IVPB        3 g 200 mL/hr over 30 Minutes Intravenous Every 6 hours 11/13/23 1555 11/27/23 1659   11/13/23 0600  cefoTEtan  (CEFOTAN ) 2 g in sodium chloride  0.9 % 100 mL IVPB        2 g 200 mL/hr over 30 Minutes Intravenous On call to O.R. 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

## 2023-11-19 NOTE — Consult Note (Signed)
 PHARMACY - TOTAL PARENTERAL NUTRITION CONSULT NOTE   Indication: Prolonged ileus  Patient Measurements: Height: 5' (152.4 cm) Weight: 65.3 kg (143 lb 15.4 oz) IBW/kg (Calculated) : 45.5 TPN AdjBW (KG): 48.8 Body mass index is 28.12 kg/m. Usual Weight: 64.9  Assessment:  NE is a 88 yo female who presented with colostomy issues w separation of mucocutaneous junction and distal necrosis post parietal colectomy due to adenocarcinoma of colon. Previously colectomy was 4/8. They underwent revision of colostomy on 4/25. Pharmacy has been consulted to manage this patient's TPN.   Glucose / Insulin : BG 62-165. 2 units of novolog  in the last 24 hours.  Electrolytes: WNL Renal: Scr < 1 Hepatic: WNL Intake / Output; MIVF: No MIVF at this time  Intake/Output Summary (Last 24 hours) at 11/19/2023 0654 Last data filed at 11/18/2023 2133 Gross per 24 hour  Intake 731.71 ml  Output 200 ml  Net 531.71 ml    GI Imaging: 4/23: CT Abdomen Pelvis 1. Surgical changes from a left colectomy and left lower colostomy. The distal colostomy is abnormal. Beginning approximately 10 cm from the ostomy site the colon is very amorphous and is filled with fluid. There are abnormal gas collections around the bowel and there is a large collection of gas in the subcutaneous tissues. Findings very worrisome for necrosis of the distal 10 cm of colon. 4/30 CT abd/pel: --Multiple fluid collections are noted throughout the peritoneal space, concerning for possible abscesses.  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, complicated by pertinent comorbid conditions    Central access: Order for IR to place PICC TPN start date: 4/29  Nutritional Goals: Goal TPN rate is 65 mL/hr (provides 89 g of protein and 1672 kcals per day)  RD  Assessment: Estimated Needs Total Energy Estimated Needs: 1600-1800kcal/day Total Protein Estimated Needs: 80-90g/day Total Fluid Estimated Needs: 1.4-1.6L/day  Current Nutrition:  NPO  Plan:  Increase TPN to goal at 65 mL/hr at 1800 Electrolytes in TPN: Na 23mEq/L, K 50mEq/L, Ca 42mEq/L, Mg 3 mEq/L (adjusted 5/1), and Phos 15. Cl:Ac 1:1 Add standard MVI and trace elements to TPN Initiate Sensitive q6h SSI and adjust as needed  Thiamine  100 mg IV daily x 5 days per dietary Add chromium 10 mcg per dietary recommendations  No MVIF at this time Monitor TPN labs on Mon/Thurs, daily until stable  Ramonita Burow, PharmD 11/19/2023,6:54 AM

## 2023-11-20 ENCOUNTER — Inpatient Hospital Stay

## 2023-11-20 DIAGNOSIS — C189 Malignant neoplasm of colon, unspecified: Secondary | ICD-10-CM | POA: Diagnosis not present

## 2023-11-20 LAB — CBC
HCT: 26 % — ABNORMAL LOW (ref 36.0–46.0)
Hemoglobin: 8.5 g/dL — ABNORMAL LOW (ref 12.0–15.0)
MCH: 28 pg (ref 26.0–34.0)
MCHC: 32.7 g/dL (ref 30.0–36.0)
MCV: 85.5 fL (ref 80.0–100.0)
Platelets: 546 10*3/uL — ABNORMAL HIGH (ref 150–400)
RBC: 3.04 MIL/uL — ABNORMAL LOW (ref 3.87–5.11)
RDW: 16.1 % — ABNORMAL HIGH (ref 11.5–15.5)
WBC: 17.5 10*3/uL — ABNORMAL HIGH (ref 4.0–10.5)
nRBC: 0.2 % (ref 0.0–0.2)

## 2023-11-20 LAB — BRAIN NATRIURETIC PEPTIDE: B Natriuretic Peptide: 853.3 pg/mL — ABNORMAL HIGH (ref 0.0–100.0)

## 2023-11-20 LAB — BASIC METABOLIC PANEL WITH GFR
Anion gap: 11 (ref 5–15)
BUN: 13 mg/dL (ref 8–23)
CO2: 23 mmol/L (ref 22–32)
Calcium: 7.9 mg/dL — ABNORMAL LOW (ref 8.9–10.3)
Chloride: 104 mmol/L (ref 98–111)
Creatinine, Ser: 0.62 mg/dL (ref 0.44–1.00)
GFR, Estimated: >60 mL/min (ref >60)
Glucose, Bld: 134 mg/dL — ABNORMAL HIGH (ref 70–99)
Potassium: 4.1 mmol/L (ref 3.5–5.1)
Sodium: 138 mmol/L (ref 135–145)

## 2023-11-20 LAB — GLUCOSE, CAPILLARY
Glucose-Capillary: 134 mg/dL — ABNORMAL HIGH (ref 70–99)
Glucose-Capillary: 157 mg/dL — ABNORMAL HIGH (ref 70–99)
Glucose-Capillary: 136 mg/dL — ABNORMAL HIGH (ref 70–99)
Glucose-Capillary: 188 mg/dL — ABNORMAL HIGH (ref 70–99)

## 2023-11-20 LAB — MAGNESIUM: Magnesium: 1.9 mg/dL (ref 1.7–2.4)

## 2023-11-20 LAB — PHOSPHORUS: Phosphorus: 2.9 mg/dL (ref 2.5–4.6)

## 2023-11-20 MED ORDER — MIDAZOLAM HCL 2 MG/2ML IJ SOLN
INTRAMUSCULAR | Status: AC | PRN
  Administered 2023-11-20: .5 mg via INTRAVENOUS

## 2023-11-20 MED ORDER — BUDESONIDE 0.25 MG/2ML IN SUSP
0.2500 mg | Freq: Two times a day (BID) | RESPIRATORY_TRACT | Status: DC
Start: 2023-11-20 — End: 2023-11-28
  Administered 2023-11-20 – 2023-11-28 (×16): 0.25 mg via RESPIRATORY_TRACT
  Filled 2023-11-20 (×16): qty 2

## 2023-11-20 MED ORDER — SODIUM CHLORIDE 0.9% FLUSH
5.0000 mL | Freq: Three times a day (TID) | INTRAVENOUS | Status: DC
  Administered 2023-11-20 – 2023-12-15 (×62): 5 mL

## 2023-11-20 MED ORDER — ALTEPLASE 2 MG IJ SOLR
2.0000 mg | Freq: Once | INTRAMUSCULAR | Status: DC
  Filled 2023-11-20: qty 2

## 2023-11-20 MED ORDER — ARFORMOTEROL TARTRATE 15 MCG/2ML IN NEBU
15.0000 ug | INHALATION_SOLUTION | Freq: Two times a day (BID) | RESPIRATORY_TRACT | Status: DC
  Administered 2023-11-20 – 2023-11-28 (×16): 15 ug via RESPIRATORY_TRACT
  Filled 2023-11-20 (×17): qty 2

## 2023-11-20 MED ORDER — MIDAZOLAM HCL 2 MG/2ML IJ SOLN
INTRAMUSCULAR | Status: AC
  Filled 2023-11-20: qty 2

## 2023-11-20 MED ORDER — FUROSEMIDE 10 MG/ML IJ SOLN
40.0000 mg | Freq: Once | INTRAMUSCULAR | Status: AC
  Administered 2023-11-20: 40 mg via INTRAVENOUS
  Filled 2023-11-20: qty 4

## 2023-11-20 MED ORDER — ALBUMIN HUMAN 25 % IV SOLN
25.0000 g | Freq: Once | INTRAVENOUS | Status: AC
  Administered 2023-11-20: 25 g via INTRAVENOUS
  Filled 2023-11-20: qty 100

## 2023-11-20 MED ORDER — FENTANYL CITRATE (PF) 100 MCG/2ML IJ SOLN
INTRAMUSCULAR | Status: AC
  Filled 2023-11-20: qty 2

## 2023-11-20 MED ORDER — IPRATROPIUM-ALBUTEROL 0.5-2.5 (3) MG/3ML IN SOLN
3.0000 mL | Freq: Four times a day (QID) | RESPIRATORY_TRACT | Status: DC
  Administered 2023-11-20: 3 mL via RESPIRATORY_TRACT
  Filled 2023-11-20: qty 3

## 2023-11-20 MED ORDER — LIDOCAINE HCL (PF) 1 % IJ SOLN
10.0000 mL | Freq: Once | INTRAMUSCULAR | Status: AC
  Administered 2023-11-20: 10 mL via INTRADERMAL
  Filled 2023-11-20: qty 10

## 2023-11-20 MED ORDER — TRAVASOL 10 % IV SOLN
INTRAVENOUS | Status: AC
  Filled 2023-11-20: qty 873.6

## 2023-11-20 MED ORDER — IPRATROPIUM-ALBUTEROL 0.5-2.5 (3) MG/3ML IN SOLN
3.0000 mL | Freq: Three times a day (TID) | RESPIRATORY_TRACT | Status: DC
  Administered 2023-11-21: 3 mL via RESPIRATORY_TRACT
  Filled 2023-11-20: qty 3

## 2023-11-20 MED ORDER — FENTANYL CITRATE (PF) 100 MCG/2ML IJ SOLN
INTRAMUSCULAR | Status: AC | PRN
  Administered 2023-11-20: 25 ug via INTRAVENOUS

## 2023-11-20 NOTE — Consult Note (Signed)
 PHARMACY - TOTAL PARENTERAL NUTRITION CONSULT NOTE   Indication: Prolonged ileus  Patient Measurements: Height: 5' (152.4 cm) Weight: 70.5 kg (155 lb 6.8 oz) IBW/kg (Calculated) : 45.5 TPN AdjBW (KG): 48.8 Body mass index is 30.35 kg/m. Usual Weight: 64.9  Assessment:  NE is a 88 yo female who presented with colostomy issues w separation of mucocutaneous junction and distal necrosis post parietal colectomy due to adenocarcinoma of colon. Previously colectomy was 4/8. They underwent revision of colostomy on 4/25. Pharmacy has been consulted to manage this patient's TPN.   Glucose / Insulin : BG 129-148. 3 units of novolog  in the last 24 hours.  Electrolytes: WNL Renal: Scr < 1 Hepatic: WNL Intake / Output; MIVF: No MIVF at this time  Intake/Output Summary (Last 24 hours) at 11/20/2023 0815 Last data filed at 11/20/2023 0520 Gross per 24 hour  Intake 1105.32 ml  Output 460 ml  Net 645.32 ml    GI Imaging: 4/23: CT Abdomen Pelvis 1. Surgical changes from a left colectomy and left lower colostomy. The distal colostomy is abnormal. Beginning approximately 10 cm from the ostomy site the colon is very amorphous and is filled with fluid. There are abnormal gas collections around the bowel and there is a large collection of gas in the subcutaneous tissues. Findings very worrisome for necrosis of the distal 10 cm of colon. 4/30 CT abd/pel: --Multiple fluid collections are noted throughout the peritoneal space, concerning for possible abscesses.  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, complicated by pertinent comorbid conditions    Central access: Order for IR to place PICC TPN start date: 4/29  Nutritional Goals: Goal TPN rate is 65 mL/hr (provides 87 g of protein and 1666 kcals per day)  RD  Assessment: Estimated Needs Total Energy Estimated Needs: 1600-1800kcal/day Total Protein Estimated Needs: 80-90g/day Total Fluid Estimated Needs: 1.4-1.6L/day  Current Nutrition:  NPO  Plan:  Increase TPN to goal at 65 mL/hr at 1800 Electrolytes in TPN: Na 66mEq/L, K 65mEq/L, Ca 28mEq/L, Mg 5 mEq/L, and Phos 15. Cl:Ac 1:1 Add standard MVI and trace elements to TPN Initiate Sensitive q6h SSI and adjust as needed  Thiamine  100 mg IV daily x 5 days per dietary Add chromium 10 mcg per dietary recommendations  No MVIF at this time Monitor TPN labs on Mon/Thurs, daily until stable  Ramonita Burow, PharmD 11/20/2023,8:15 AM

## 2023-11-20 NOTE — Progress Notes (Signed)
 CC: intestinal fistula Subjective: 540 cc from drains, endorses no abdominal pain but she is weak Some dyspnea Wbc climbing CT reviewed w IR and they think they can place additional drains  Objective: Vital signs in last 24 hours: Temp:  [98.2 F (36.8 C)-100 F (37.8 C)] 100 F (37.8 C) (05/02 0836) Pulse Rate:  [89-106] 106 (05/02 0836) Resp:  [18-20] 18 (05/02 0836) BP: (122-158)/(56-79) 142/56 (05/02 0836) SpO2:  [93 %-98 %] 98 % (05/02 0836) Weight:  [70.5 kg] 70.5 kg (05/02 0500) Last BM Date : 11/17/23  Intake/Output from previous day: 05/01 0701 - 05/02 0700 In: 1105.3 [I.V.:1005.3; IV Piggyback:100] Out: 560 [Drains:540; Stool:20] Intake/Output this shift: No intake/output data recorded.  Physical exam:  Debilitated Abd: soft, bilious drainage, now Left side seems to be more active. Drain holding suction. No peritonitis  Lab Results: CBC  Recent Labs    11/20/23 0501  WBC 17.5*  HGB 8.5*  HCT 26.0*  PLT 546*   BMET Recent Labs    11/19/23 0516 11/20/23 0501  NA 142 138  K 4.0 4.1  CL 109 104  CO2 22 23  GLUCOSE 122* 134*  BUN 15 13  CREATININE 0.65 0.62  CALCIUM 7.3* 7.9*   PT/INR No results for input(s): "LABPROT", "INR" in the last 72 hours. ABG No results for input(s): "PHART", "HCO3" in the last 72 hours.  Invalid input(s): "PCO2", "PO2"  Studies/Results: No results found.  Anti-infectives: Anti-infectives (From admission, onward)    Start     Dose/Rate Route Frequency Ordered Stop   11/18/23 1015  fluconazole  (DIFLUCAN ) IVPB 200 mg        200 mg 100 mL/hr over 60 Minutes Intravenous Every 24 hours 11/18/23 0930     11/13/23 1700  Ampicillin -Sulbactam (UNASYN ) 3 g in sodium chloride  0.9 % 100 mL IVPB        3 g 200 mL/hr over 30 Minutes Intravenous Every 6 hours 11/13/23 1555 11/27/23 1659   11/13/23 0600  cefoTEtan  (CEFOTAN ) 2 g in sodium chloride  0.9 % 100 mL IVPB        2 g 200 mL/hr over 30 Minutes Intravenous On call to  O.R. 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  I would hope to avoid re intervention in this unfortunate pt with a very hostile abdomen with prior radiation and multiple surgical procedures. Continue TPN, NPO and antimicrobials Pt and family updated  Evelia Hipp, MD, [facility]  11/20/2023

## 2023-11-20 NOTE — Progress Notes (Signed)
 Patient clinically stable post CT abscess 10 FR drain placement X 2 on left abdomen as charted per Dr Mabel Savage, tolerated well. Cultures sent to lab, received Versed  0.5 mg along with Fentanyl  25 mcg IV  for procedure. Report given to Jequetta Thomas RN post procedure/specials/19

## 2023-11-20 NOTE — Consult Note (Signed)
 [facility] Consultation Progress Note  Patient: Kristin Miller ZOX:[id number]   PCP: Ziglar, Susan K, MD DOB: [birth year]   DOA: 11/13/2023   DOS: 11/20/2023   Date of Service: the patient was seen and examined on 11/20/2023 Primary service: Alben Alma, MD    Brief hospital course: Pt. with PMH of hypertension, colon cancer, adenocarcinoma s/p resection and colostomy, right breast cancer, T-spine fracture, left shoulder BCC, admitted on 11/13/2023, with complaint of leaking of ostomy site patient was admitted by general surgery for ostomy revision, was found to have dyspnea and hypoxia.  So [facility] was consulted for further management.   Subjective: No significant events overnight 5/2 patient was seen and examined at bedside, stated that she is feeling shortness of breath which is not getting better since admission.  Does not feel any chest pain or palpitations. Complaining of dry mouth and she is not allowed to eat and drink anything, she is NPO.  Advised to use oral swabs to keep mouth moist. Patient denied any other complaints.  Assessment and Plan: Principal Problem:   Colon cancer (HCC)   # Acute hypoxic respiratory failure 5/2 audible wheezing, could be COPD saturation CXR shows pulmonary edema and mild pleural effusion Continue supplemental O2 inhalation Lasix  40 mg time 1 dose given Started DuoNeb every 6 hourly scheduled Started Brovana  and Pulmicort  nebulizer twice daily Follow BNP   # Left lower extremity edema Lasix  40 mg IV one-time dose given Check BNP Follow-up venous duplex to rule out DVT    # Colon cancer, adenocarcinoma S/p robotic assisted laparoscopic partial colectomy done on 10/27/2023 and patient was discharged on 1425 Patient was readmitted on 4/25 for ostomy revision: as per Sx  (Necrosis of end colostomy at the level of the fascia or just below it, not easy to tell given severe inflammatory response Thick and dense adhesions making this a very  complicated case, We performed Enterolysis for at least 90 minutes and this was required to resore the complex anatomy0 Multiple intra-abdominal abscesses 5/2 IR consulted for drain insertion As per general surgery, no need to culture drains, discontinued ID consult for now Continue antibiotic Unasyn  as per primary team Octreotide  200 mcg SQ every 8 hourly   # Hypertension: Amlodipine  5 mg p.o. daily Monitor BP and titrate medications accordingly   Diet: NPO on TPN DVT Prophylaxis: Subcutaneous Lovenox   Family Communication: family was present at bedside, at the time of interview. The pt provided permission to discuss medical plan with the family. Opportunity was given to ask question and all questions were answered satisfactorily.    Disposition: [facility]: We will continue to follow the patient.  DC plan depends on primary team  Recommendation on discharge:  Continue above assessment plan [facility] will continue to follow during hospital stay.  Other Consultants: None (d/c'd ID consult) Procedures: 5/2 intra-abdominal drain insertion by IR Antibiotics: Unasyn  Anti-infectives (From admission, onward)    Start     Dose/Rate Route Frequency Ordered Stop   11/18/23 1015  fluconazole  (DIFLUCAN ) IVPB 200 mg        200 mg 100 mL/hr over 60 Minutes Intravenous Every 24 hours 11/18/23 0930     11/13/23 1700  Ampicillin -Sulbactam (UNASYN ) 3 g in sodium chloride  0.9 % 100 mL IVPB        3 g 200 mL/hr over 30 Minutes Intravenous Every 6 hours 11/13/23 1555 11/27/23 1659   11/13/23 0600  cefoTEtan  (CEFOTAN ) 2 g in sodium chloride  0.9 % 100 mL IVPB  2 g 200 mL/hr over 30 Minutes Intravenous On call to O.R. 11/13/23 0043 11/13/23 0954       Objective: Physical Exam: Vitals:   11/20/23 1601 11/20/23 1606 11/20/23 1614 11/20/23 1636  BP: (!) 117/54 (!) 116/55 125/60 (!) 122/57  Pulse: 88 87 89 89  Resp: 19 19 (!) 22 (!) 21  Temp:    98.6 F (37 C)  TempSrc:    Oral  SpO2: 93% 95%  94% 93%  Weight:      Height:        Intake/Output Summary (Last 24 hours) at 11/20/2023 1654 Last data filed at 11/20/2023 1458 Gross per 24 hour  Intake 4051.2 ml  Output 365 ml  Net 3686.2 ml   Filed Weights   11/18/23 0500 11/20/23 0500 11/20/23 1521  Weight: 65.3 kg 70.5 kg 70.5 kg   General: alert and oriented to time, place, and person. Appear in mild distress, affect appropriate Eyes: PERRLA, Conjunctiva normal ENT: Oral Mucosa Clear, dry  Neck: no JVD, no Abnormal Mass Or lumps Cardiovascular: S1 and S2 Present, no Murmur, peripheral pulses symmetrical Respiratory: mild increased respiratory effort, Bilateral Air entry equal and Decreased, no use of accessory muscle, bibasilar crackles, and mild wheezes Abdomen: Bowel Sound present, Soft and mild generalized tenderness.  Extremities: LLE 1-2+ Pedal edema,  calf tenderness Neurologic: without any new focal findings Gait not checked due to patient safety concerns  Data Reviewed: CBC: Recent Labs  Lab 11/14/23 0550 11/20/23 0501  WBC 13.3* 17.5*  HGB 10.2* 8.5*  HCT 30.8* 26.0*  MCV 85.8 85.5  PLT 745* 546*   Basic Metabolic Panel: Recent Labs  Lab 11/16/23 0608 11/17/23 0439 11/18/23 0422 11/19/23 0516 11/20/23 0501 11/20/23 0706  NA 139 139 141 142 138  --   K 3.5 3.6 3.5 4.0 4.1  --   CL 111 109 110 109 104  --   CO2 22 21* 24 22 23   --   GLUCOSE 128* 96 153* 122* 134*  --   BUN 16 14 15 15 13   --   CREATININE 0.72 0.58 0.61 0.65 0.62  --   CALCIUM 6.6* 6.8* 7.0* 7.3* 7.9*  --   MG 2.4 2.6* 2.4 2.1  --  1.9  PHOS 2.0* 2.4* 2.2* 2.6  --  2.9   Liver Function Tests: Recent Labs  Lab 11/14/23 0550 11/15/23 0457 11/16/23 0608 11/18/23 0422 11/19/23 0516  AST 18 15 13* 16 19  ALT 11 8 9 9 9   ALKPHOS 37* 38 44 52 63  BILITOT 0.5 0.6 0.7 0.3 0.3  PROT 5.2* 4.5* 4.7* 4.9* 5.3*  ALBUMIN  2.3* 1.9* 1.7* 1.7* 1.7*   No results for input(s): "LIPASE", "AMYLASE" in the last 168 hours. No results for  input(s): "AMMONIA" in the last 168 hours. Cardiac Enzymes: No results for input(s): "CKTOTAL", "CKMB", "CKMBINDEX", "TROPONINI" in the last 168 hours. BNP (last 3 results) No results for input(s): "BNP" in the last 8760 hours. CBG: Recent Labs  Lab 11/19/23 1115 11/19/23 1743 11/19/23 2313 11/20/23 0529 11/20/23 1140  GLUCAP 139* 129* 134* 134* 157*   No results found for this or any previous visit (from the past 240 hours).  Studies: No results found.   Scheduled Meds:  acetaminophen   1,000 mg Oral Q6H   alteplase   2 mg Intracatheter Once   amLODipine   5 mg Oral Daily   enoxaparin  (LOVENOX ) injection  40 mg Subcutaneous Q24H   fentaNYL   insulin  aspart  0-9 Units Subcutaneous Q6H   latanoprost   1 drop Both Eyes QHS   loperamide   4 mg Oral Q6H   midazolam        octreotide   200 mcg Subcutaneous Q8H   pantoprazole  (PROTONIX ) IV  40 mg Intravenous Q12H   sodium chloride  flush  5 mL Intracatheter Q8H   Continuous Infusions:  ampicillin -sulbactam (UNASYN ) IV 3 g (11/20/23 1120)   fluconazole  (DIFLUCAN ) IV 200 mg (11/20/23 1018)   TPN ADULT (ION) 65 mL/hr at 11/19/23 1750   TPN ADULT (ION)     PRN Meds: diphenhydrAMINE  **OR** diphenhydrAMINE , fentaNYL , hydrALAZINE , methocarbamol  **OR** methocarbamol  (ROBAXIN ) injection, midazolam , morphine  injection, ondansetron  (ZOFRAN ) IV, mouth rinse, oxyCODONE , prochlorperazine  **OR** prochlorperazine   Time spent: 55 minutes  Author: Althia Atlas  [facility] 11/20/2023 4:54 PM  To reach On-call, see care teams to locate the attending and reach out to them via [email or web address]. If 7PM-7AM, please contact night-coverage If you still have difficulty reaching the attending provider, please page the [facility] (Director on Call) for [facility] on amion for assistance.

## 2023-11-20 NOTE — Procedures (Signed)
 Interventional Radiology Procedure Note  Procedure: Image guided drain placement  #1 = superior left, along greater curvature of stomach, .  8F pigtail drain.  #2 = lower midline, near the ostomy, 8F pigtail  Complications: None  EBL: None Sample: Culture sent #1 = superior left, along greater curvature of stomach, .  8F pigtail drain.  #2 = lower midline, near the ostomy, 8F pigtail   Recommendations: - Routine drain care, with sterile flushes, record output - follow up Cx - routine wound care - ok to restart any AC as needed  Signed,  Marciano Settles. Mabel Savage, DO

## 2023-11-20 NOTE — Consult Note (Signed)
 Chief Complaint: Abdominal abscesses - IR consulted for percutaneous abdominal drain placements  Referring Provider(s): Pabon, Marcial Setting, MD   Supervising Physician: Myrlene Asper  Patient Status: [facility] - In-pt  History of Present Illness: Kristin Miller is a 88 y.o. female with pmhx HTN, migraines, chronic back pain, colon cancer. Currently in the hospital with complications from prior colon surgeries. Initially underwent colon resection with ostomy formation secondary to colon cancer on 10/27/23 with Dr. Dana Duncan form general surgery. Pt then developed necrosis of the ostomy site and had colostomy revision with x2 drains placed on 11/13/23. She continued to have some mild abd discomfort and weakness. CT abd/pelvis performed 11/18/23 showed concern for multiple intra-abdominal abscesses. Surgery now has consulted IR for placement of percutaneous abdominal drains in attempt to hopefully avoid third surgical operation. CT impression from 11/18/23: Multiple fluid collections are noted throughout the peritoneal space as noted above. This includes 8.4 x 4.2 cm fluid collection or abscess along the greater curvature of the stomach. 6.6 x 2.2 cm ill-defined fluid collection is developing in left iliac fossa. 4.2 x 2.0 cm fluid collection is noted anteriorly in left upper quadrant. 4.8 x 2.5 cm fluid collection is seen laterally in right upper quadrant. These are concerning for possible abscesses.   Also noted is a 0.6 x 3.8 cm collection of contrast and probable stool seen in right lower quadrant in pelvis; is uncertain if this represents dilated colon or potentially extravasated material; attention on follow-up imaging is recommended.  Today she reports some mild left upper and lower abdominal tenderness and feels generally weak. Otherwise denies other associated symptoms.   Patient is Full Code  Past Medical History:  Diagnosis Date   Allergy    Anemia    H/O AS A CHILD   Arthritis     Back pain    Breast cancer of upper-outer quadrant of right female breast (HCC) 06/12/2015   4 millimeter, T1a, N0; ER 90%, PR 0%, HER-2/neu not overexpressed.  Declined radiation therapy.   Collar bone fracture 05/28/2013   Fractured Collar bone on left   Dyspnea    Dysrhythmia    h/o heart skipping beat years ago   Frequent headaches    h/o migraines   Hypertension    Osteopenia 04/2018   Bone density without significant interval change since 2017.   Pneumonia     Past Surgical History:  Procedure Laterality Date   ABDOMINAL HYSTERECTOMY  1970   Partial   BREAST BIOPSY Right 05/28/2015   INVASIVE MAMMARY CARCINOMA    BREAST BIOPSY Right 06/12/2015   Wide excision, sentinel lymph node biopsy.   BREAST EXCISIONAL BIOPSY Right    BREAST LUMPECTOMY WITH AXILLARY LYMPH NODE DISSECTION Right 06/12/2015   4 mm, T1a,N0 withDCIS with clear margins. ER: 90%, PR 0%; Her 2 neu not overexpressed.    CATARACT EXTRACTION W/PHACO Left 02/03/2017   Procedure: CATARACT EXTRACTION PHACO AND INTRAOCULAR LENS PLACEMENT (IOC);  Surgeon: Clair Crews, MD;  Location: [facility] ORS;  Service: Ophthalmology;  Laterality: Left;  US   00:28 AP% 16.3 CDE 4.68 Fluid pack lot # [id number]   CATARACT EXTRACTION W/PHACO Right 03/01/2019   Procedure: CATARACT EXTRACTION PHACO AND INTRAOCULAR LENS PLACEMENT (IOC)  RIGHT;  Surgeon: Clair Crews, MD;  Location: [facility];  Service: Ophthalmology;  Laterality: Right;   COLONOSCOPY     COLONOSCOPY WITH PROPOFOL  N/A 06/27/2022   Procedure: COLONOSCOPY WITH PROPOFOL ;  Surgeon: Marnee Sink, MD;  Location: [facility];  Service: Endoscopy;  Laterality: N/A;   COLONOSCOPY WITH PROPOFOL  N/A 10/06/2023   Procedure: COLONOSCOPY WITH PROPOFOL ;  Surgeon: Marnee Sink, MD;  Location: [facility];  Service: Endoscopy;  Laterality: N/A;   COLOSTOMY REVISION N/A 11/13/2023   Procedure: REVISION, COLOSTOMY;  Surgeon: Alben Alma, MD;  Location: [facility]  ORS;  Service: General;  Laterality: N/A;   ESOPHAGOGASTRODUODENOSCOPY (EGD) WITH PROPOFOL  N/A 06/27/2022   Procedure: ESOPHAGOGASTRODUODENOSCOPY (EGD) WITH PROPOFOL ;  Surgeon: Marnee Sink, MD;  Location: [facility];  Service: Endoscopy;  Laterality: N/A;   PARTIAL COLECTOMY N/A 07/29/2022   Procedure: PARTIAL COLECTOMY, open left, RNFA to assist;  Surgeon: Alben Alma, MD;  Location: [facility] ORS;  Service: General;  Laterality: N/A;   POLYPECTOMY  10/06/2023   Procedure: POLYPECTOMY;  Surgeon: Marnee Sink, MD;  Location: [facility] ENDOS[facility];  Service: Endoscopy;;    Allergies: Combigan  [brimonidine  tartrate-timolol ], Lisinopril , and Other  Medications: Prior to Admission medications   Medication Sig Start Date End Date Taking? Authorizing Provider  acetaminophen  (TYLENOL ) 500 MG tablet Take 500 mg by mouth every 6 (six) hours as needed for pain.   Yes [provider]  alendronate  (FOSAMAX ) 70 MG tablet Take 1 tablet (70 mg total) by mouth every 7 (seven) days for 48 doses. Take with a full glass of water  on an empty stomach. 07/21/23 06/15/24 Yes Ziglar, Susan K, MD  amLODipine  (NORVASC ) 5 MG tablet Take 1 tablet (5 mg total) by mouth daily. 07/28/23  Yes Ziglar, Susan K, MD  docusate sodium (COLACE) 100 MG capsule Take 100 mg by mouth daily as needed for mild constipation.   Yes [provider]  latanoprost  (XALATAN ) 0.005 % ophthalmic solution 1 drop at bedtime. Eye doctor both eyes 12/18/20  Yes [provider]  loperamide  (IMODIUM ) 2 MG capsule Take 1 capsule (2 mg total) by mouth as needed for diarrhea or loose stools. May take 1-2 capsules every 8 hours 11/09/23  Yes Pabon, Diego F, MD  losartan -hydrochlorothiazide  (HYZAAR) 100-12.5 MG tablet Take 1 tablet by mouth daily. 07/21/23  Yes Ziglar, Susan K, MD  metroNIDAZOLE  (FLAGYL ) 500 MG tablet Take 2 tabs (1000 mg total) at 8 am, again at 2 pm, and again at 8 pm the day prior to surgery. 11/11/23  Yes Pabon,  Diego F, MD  Multiple Minerals-Vitamins (CAL MAG ZINC +D3 PO) Take 1 tablet by mouth daily.   Yes [provider]  neomycin  (MYCIFRADIN ) 500 MG tablet Take 2 tabs (1000 mg total) at 8 am, again at 2 pm, and again at 8 pm the day prior to surgery. 11/11/23  Yes Pabon, Diego F, MD  oxyCODONE  (OXY IR/ROXICODONE ) 5 MG immediate release tablet Take 1 tablet (5 mg total) by mouth every 4 (four) hours as needed for severe pain (pain score 7-10). 11/09/23  Yes Pabon, Diego F, MD  potassium chloride  SA (KLOR-CON  M) 20 MEQ tablet Take 2 tablets (40 mEq total) by mouth daily. 11/09/23 12/09/23 Yes Pabon, Marcial Setting, MD     Family History  Problem Relation Age of Onset   Mental illness Mother        Dementia   Stroke Father    Lung cancer Brother    Breast cancer Other        dx under 51   Brain cancer Granddaughter 17       glioblastoma    Social History   Socioeconomic History   Marital status: Married    Spouse name: ,Nicholette Barley   Number of children:  3   Years of education: GED   Highest education level: 12th grade  Occupational History   Occupation: Retired  Tobacco Use   Smoking status: Never    Passive exposure: Never   Smokeless tobacco: Never   Tobacco comments:    smoking cessation materials not required  Vaping Use   Vaping status: Never Used  Substance and Sexual Activity   Alcohol use: No   Drug use: No   Sexual activity: Not Currently  Other Topics Concern   Not on file  Social History Narrative   Not on file   Social Drivers of Health   Financial Resource Strain: Low Risk  (01/09/2023)   Received from [facility], [facility]   Overall Financial Resource Strain (CARDIA)    Difficulty of Paying Living Expenses: Not very hard  Food Insecurity: No Food Insecurity (11/14/2023)   Hunger Vital Sign    Worried About Running Out of Food in the Last Year: Never true    Ran Out of Food in the Last Year: Never true  Transportation Needs:  No Transportation Needs (11/14/2023)   PRAPARE - Administrator, Civil Service (Medical): No    Lack of Transportation (Non-Medical): No  Physical Activity: Inactive (09/25/2021)   Exercise Vital Sign    Days of Exercise per Week: 0 days    Minutes of Exercise per Session: 0 min  Stress: No Stress Concern Present (09/25/2021)   [employer] of Occupational Health - Occupational Stress Questionnaire    Feeling of Stress : Not at all  Social Connections: Moderately Integrated (11/14/2023)   Social Connection and Isolation Panel [NHANES]    Frequency of Communication with Friends and Family: More than three times a week    Frequency of Social Gatherings with Friends and Family: More than three times a week    Attends Religious Services: More than 4 times per year    Active Member of [employer] or Organizations: No    Attends Banker Meetings: Never    Marital Status: Married     Review of Systems: A 12 point ROS discussed and pertinent positives are indicated in the HPI above.  All other systems are negative.  Review of Systems  Constitutional:  Positive for fatigue.  Gastrointestinal:  Positive for abdominal pain.    Vital Signs: BP (!) 142/56 (BP Location: Right Leg)   Pulse (!) 106   Temp 100 F (37.8 C) (Oral)   Resp 18   Ht 5' (1.524 m)   Wt 155 lb 6.8 oz (70.5 kg)   SpO2 98%   BMI 30.35 kg/m   Advance Care Plan: The advanced care place/surrogate decision maker was discussed at the time of visit and the patient did not wish to discuss or was not able to name a surrogate decision maker or provide an advance care plan.  Physical Exam Vitals and nursing note reviewed.  Constitutional:      Comments: Appears generally ill and fatigued  HENT:     Mouth/Throat:     Mouth: Mucous membranes are moist.     Pharynx: Oropharynx is clear.  Cardiovascular:     Rate and Rhythm: Regular rhythm. Tachycardia present.  Pulmonary:     Effort: Pulmonary effort is  normal.     Breath sounds: Normal breath sounds.  Abdominal:     Palpations: Abdomen is soft.     Tenderness: There is abdominal tenderness (mild - LUQ and LLQ).  Comments: X2 drains noted, 1 from near left midline of abd and 1 from left abdomen. Left abd drain has mild amount of leakage surrounding. Pt denies any pain surrounding the drains.   Musculoskeletal:     Right lower leg: No edema.     Left lower leg: No edema.  Skin:    General: Skin is warm and dry.  Neurological:     Mental Status: She is alert and oriented to person, place, and time. Mental status is at baseline.     Imaging: CT ABDOMEN PELVIS W CONTRAST Result Date: 11/18/2023 CLINICAL DATA:  Postoperative status. History of enterocutaneous fistula. EXAM: CT ABDOMEN AND PELVIS WITH CONTRAST TECHNIQUE: Multidetector CT imaging of the abdomen and pelvis was performed using the standard protocol following bolus administration of intravenous contrast. RADIATION DOSE REDUCTION: This exam was performed according to the departmental dose-optimization program which includes automated exposure control, adjustment of the mA and/or kV according to patient size and/or use of iterative reconstruction technique. CONTRAST:  OMNIPAQUE  IOHEXOL  300 MG/ML  SOLN COMPARISON:  November 11, 2023. FINDINGS: Lower chest: Small bilateral pleural effusions are noted with adjacent subsegmental atelectasis. Large hiatal hernia is again noted. Hepatobiliary: No cholelithiasis or biliary dilatation is noted. Stable right hepatic cyst. Pancreas: Unremarkable. No pancreatic ductal dilatation or surrounding inflammatory changes. Spleen: Normal in size without focal abnormality. Adrenals/Urinary Tract: Adrenal glands are unremarkable. Kidneys are normal, without renal calculi, focal lesion, or hydronephrosis. Bladder is unremarkable. Stomach/Bowel: No small bowel dilatation is noted. There has been colostomy revision with new colostomy placed in right lower  quadrant. Two surgical drains are noted, 1 with distal tip in left lower quadrant and the other in right upper quadrant. Vascular/Lymphatic: Aortic atherosclerosis. No enlarged abdominal or pelvic lymph nodes. Reproductive: Status post hysterectomy. No adnexal masses. Other: Pneumoperitoneum is noted consistent with recent surgery. 8.4 x 4.2 cm fluid collection is noted along the greater curvature of the stomach concerning for possible abscess. 6.6 x 2.2 cm ill-defined fluid collection is developing in the left iliac fossa. 4.2 x 2.0 cm fluid collection is seen anteriorly in left upper quadrant. 4.8 x 2.5 cm fluid collection is seen laterally in right upper quadrant. 8.6 x 3.8 cm collection is noted in right lower quadrant and pelvis which contains both contrast and stool. It is uncertain if this represents lumen or extravasation. Musculoskeletal: Stable old T12 fracture. No acute osseous abnormality is noted. IMPRESSION: Large sliding-type hiatal hernia. Small bilateral pleural effusions with adjacent subsegmental atelectasis. Status post revision of colostomy, with new colostomy seen in right lower quadrant. Two large surgical drains are present as described above. Multiple fluid collections are noted throughout the peritoneal space as noted above. This includes 8.4 x 4.2 cm fluid collection or abscess along the greater curvature of the stomach. 6.6 x 2.2 cm ill-defined fluid collection is developing in left iliac fossa. 4.2 x 2.0 cm fluid collection is noted anteriorly in left upper quadrant. 4.8 x 2.5 cm fluid collection is seen laterally in right upper quadrant. These are concerning for possible abscesses. Also noted is a 0.6 x 3.8 cm collection of contrast and probable stool seen in right lower quadrant in pelvis; is uncertain if this represents dilated colon or potentially extravasated material; attention on follow-up imaging is recommended. Aortic Atherosclerosis (ICD10-I70.0). Electronically Signed   By:  Rosalene Colon M.D.   On: 11/18/2023 08:38   IR PICC PLACEMENT LEFT >5 YRS INC IMG GUIDE Result Date: 11/17/2023 INDICATION: Patient with recent  colostomy revision. IR consulted for PICC line placement for TPN. EXAM: ULTRASOUND AND FLUOROSCOPIC GUIDED PICC LINE INSERTION MEDICATIONS: 3 mL 1% lidocaine . CONTRAST:  None FLUOROSCOPY TIME:  (1.91 mGy) COMPLICATIONS: None immediate. TECHNIQUE: The procedure, risks, benefits, and alternatives were explained to the patient and informed written consent was obtained. A timeout was performed prior to the initiation of the procedure. The left upper extremity was prepped with chlorhexidine  in a sterile fashion, and a sterile drape was applied covering the operative field. Maximum barrier sterile technique with sterile gowns and gloves were used for the procedure. A timeout was performed prior to the initiation of the procedure. Local anesthesia was provided with 1% lidocaine . Under direct ultrasound guidance, the brachial vein was accessed with a micropuncture kit after the overlying soft tissues were anesthetized with 1% lidocaine . Real-time ultrasound guidance was utilized for vascular access including the acquisition of a permanent ultrasound image documenting patency of the accessed vessel. A guidewire was advanced to the level of the superior caval-atrial junction for measurement purposes and the PICC line was cut to length. A peel-away sheath was placed and a 38 cm, 5 [place], dual lumen was inserted to level of the superior caval-atrial junction. A post procedure spot fluoroscopic was obtained. The catheter easily aspirated and flushed and was secured in place with stat lock device. A dressing was applied. The patient tolerated the procedure well without immediate post procedural complication. FINDINGS: After catheter placement, the tip lies within the superior cavoatrial junction. The catheter aspirates and flushes normally and is ready for immediate use. IMPRESSION:  Successful ultrasound and fluoroscopic guided placement of a left brachial vein approach, 38 cm, 5 French, dual lumen PICC with tip at the superior caval-atrial junction. The PICC line is ready for immediate use. Performed by Ashley Blades PA-C with assistance from Dr. Elene Griffes Electronically Signed   By: Elene Griffes M.D.   On: 11/17/2023 13:00   US  EKG SITE RITE Result Date: 11/15/2023 If Site Rite image not attached, placement could not be confirmed due to current cardiac rhythm.  US  EKG SITE RITE Result Date: 11/14/2023 If Site Rite image not attached, placement could not be confirmed due to current cardiac rhythm.  CT ABDOMEN PELVIS W CONTRAST Result Date: 11/11/2023 CLINICAL DATA:  History of recurrent colon cancer with left colectomy and end colostomy. Assess treatment response. * Tracking Code: BO * EXAM: CT ABDOMEN AND PELVIS WITH CONTRAST TECHNIQUE: Multidetector CT imaging of the abdomen and pelvis was performed using the standard protocol following bolus administration of intravenous contrast. RADIATION DOSE REDUCTION: This exam was performed according to the departmental dose-optimization program which includes automated exposure control, adjustment of the mA and/or kV according to patient size and/or use of iterative reconstruction technique. CONTRAST:  OMNIPAQUE  IOHEXOL  300 MG/ML  SOLN COMPARISON:  CT scan 10/22/2023 FINDINGS: Lower chest: The lung bases are clear of an acute process. No pulmonary lesions pulmonary nodules. Stable large hiatal hernia. Hepatobiliary: Stable small cysts. No worrisome hepatic lesions to suggest metastatic disease. No intrahepatic biliary dilatation. The gallbladder is unremarkable. Normal caliber and course of the common bile duct. Pancreas: No mass, inflammation or ductal dilatation. Spleen: Normal in size without focal abnormality. Adrenals/Urinary Tract: The adrenal glands are normal. No renal lesions or hydronephrosis. Stomach/Bowel: Large hiatal  hernia. The duodenum and small bowel are unremarkable. Surgical changes from a left colectomy and left lower colostomy. The distal colostomy is abnormal. Beginning approximately 10 cm from the ostomy site the colon is very  amorphous and is filled with fluid. There are abnormal gas collections around the bowel and there is a large collection of gas in the subcutaneous tissues. Findings very worrisome for necrosis of the distal 10 cm of colon. The remainder the right colon is unremarkable. No intra-abdominal abscess is identified. Vascular/Lymphatic: Stable vascular calcifications. No abdominal or pelvic lymphadenopathy. Reproductive: Surgically absent. Other: None Musculoskeletal: No acute bony findings. Stable T12 compression fracture with vertebral plana. IMPRESSION: 1. Surgical changes from a left colectomy and left lower colostomy. The distal colostomy is abnormal. Beginning approximately 10 cm from the ostomy site the colon is very amorphous and is filled with fluid. There are abnormal gas collections around the bowel and there is a large collection of gas in the subcutaneous tissues. Findings very worrisome for necrosis of the distal 10 cm of colon. 2. No findings for abdominal/pelvic metastatic disease. 3. Stable large hiatal hernia. These results will be called to the ordering clinician or representative by the Radiologist Assistant, and communication documented in the PACS or [employer]. Electronically Signed   By: Marrian Siva M.D.   On: 11/11/2023 10:52   CT CHEST ABDOMEN PELVIS W CONTRAST Result Date: 10/22/2023 CLINICAL DATA:  Recurrent colon carcinoma at colostomy stoma. Restaging. Previous partial colectomy and radiation therapy. * Tracking Code: BO * EXAM: CT CHEST, ABDOMEN, AND PELVIS WITH CONTRAST TECHNIQUE: Multidetector CT imaging of the chest, abdomen and pelvis was performed following the standard protocol during bolus administration of intravenous contrast. RADIATION DOSE REDUCTION:  This exam was performed according to the departmental dose-optimization program which includes automated exposure control, adjustment of the mA and/or kV according to patient size and/or use of iterative reconstruction technique. CONTRAST:  85mL OMNIPAQUE  IOHEXOL  300 MG/ML  SOLN COMPARISON:  07/10/2023 FINDINGS: CT CHEST FINDINGS Cardiovascular: No acute findings. 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 Mediastinum/Lymph Nodes: No masses or pathologically enlarged lymph nodes identified. Large hiatal hernia again seen. Lungs/Pleura: Stable 3 mm pulmonary nodule in the posterior left upper lobe abutting the fissure on image 62/3, consistent with benign etiology, likely an intrapulmonary lymph node. No suspicious pulmonary nodules or masses identified. No evidence of infiltrate or pleural effusion. Musculoskeletal:  No suspicious bone lesions identified. CT ABDOMEN AND PELVIS FINDINGS Hepatobiliary: No masses identified. Stable small cysts noted in the inferior left hepatic lobe and anterior liver dome. Gallbladder is unremarkable. No evidence of biliary ductal dilatation. Pancreas:  No mass or inflammatory changes. Spleen:  Within normal limits in size and appearance. Adrenals/Urinary tract: No suspicious masses or hydronephrosis. Unremarkable unopacified urinary bladder. Stomach/Bowel: Large hiatal hernia again seen. Postop changes again seen from prior left colectomy with distal transverse colostomy. No soft tissue mass identified. No evidence of obstruction, inflammatory process, or abnormal fluid collections. Normal appendix visualized. Vascular/Lymphatic: No pathologically enlarged lymph nodes identified. No acute vascular findings. Reproductive: Prior hysterectomy noted. Adnexal regions are unremarkable in appearance. Other:  None. Musculoskeletal: No suspicious bone lesions identified. Old T6 and T12 vertebral body compression fractures and secondary kyphosis show no  significant change. IMPRESSION: Stable exam. No radiographic evidence of recurrent or metastatic disease. Large hiatal hernia. Electronically Signed   By: Marlyce Sine M.D.   On: 10/22/2023 13:08    Labs:  CBC: Recent Labs    10/28/23 0751 11/09/23 1131 11/14/23 0550 11/20/23 0501  WBC 16.3* 18.6* 13.3* 17.5*  HGB 11.9* 10.4* 10.2* 8.5*  HCT 35.4* 31.2* 30.8* 26.0*  PLT 277 719* 745* 546*  COAGS: No results for input(s): "INR", "APTT" in the last 8760 hours.  BMP: Recent Labs    11/17/23 0439 11/18/23 0422 11/19/23 0516 11/20/23 0501  NA 139 141 142 138  K 3.6 3.5 4.0 4.1  CL 109 110 109 104  CO2 21* 24 22 23   GLUCOSE 96 153* 122* 134*  BUN 14 15 15 13   CALCIUM 6.8* 7.0* 7.3* 7.9*  CREATININE 0.58 0.61 0.65 0.62  GFRNONAA >60 >60 >60 >60    LIVER FUNCTION TESTS: Recent Labs    11/15/23 0457 11/16/23 0608 11/18/23 0422 11/19/23 0516  BILITOT 0.6 0.7 0.3 0.3  AST 15 13* 16 19  ALT 8 9 9 9   ALKPHOS 38 44 52 63  PROT 4.5* 4.7* 4.9* 5.3*  ALBUMIN  1.9* 1.7* 1.7* 1.7*    TUMOR MARKERS: No results for input(s): "AFPTM", "CEA", "CA199", "CHROMGRNA" in the last 8760 hours.  Assessment and Plan:  Kristin Miller is a 88 y.o. female with pmhx HTN, migraines, chronic back pain, colon cancer. Currently in the hospital with complications from prior colon surgeries. Initially underwent colon resection with ostomy formation secondary to colon cancer on 10/27/23 with Dr. Dana Duncan form general surgery. Pt then developed necrosis of the ostomy site and had colostomy revision with x2 drains placed on 11/13/23. She continued to have some mild abd discomfort and weakness. CT abd/pelvis performed 11/18/23 showed concern for multiple intra-abdominal abscesses. Surgery now has consulted IR for placement of percutaneous abdominal drains in attempt to hopefully avoid third surgical operation. CT impression from 11/18/23: Multiple fluid collections are noted throughout the peritoneal  space as noted above. This includes 8.4 x 4.2 cm fluid collection or abscess along the greater curvature of the stomach. 6.6 x 2.2 cm ill-defined fluid collection is developing in left iliac fossa. 4.2 x 2.0 cm fluid collection is noted anteriorly in left upper quadrant. 4.8 x 2.5 cm fluid collection is seen laterally in right upper quadrant. These are concerning for possible abscesses.   Also noted is a 0.6 x 3.8 cm collection of contrast and probable stool seen in right lower quadrant in pelvis; is uncertain if this represents dilated colon or potentially extravasated material; attention on follow-up imaging is recommended.  Risks and benefits discussed with the patient including bleeding, infection, damage to adjacent structures, bowel perforation/fistula connection, and sepsis. All of the patient's questions were answered, patient is agreeable to proceed. Consent signed and in chart.   Thank you for allowing our service to participate in BRENASIA MICKLE 's care.  Electronically Signed: Nicolasa Barrett, PA-C   11/20/2023, 1:02 PM      I spent a total of 40 Minutes    in face to face in clinical consultation, greater than 50% of which was counseling/coordinating care for abdominal drain placements for abdominal abscesses

## 2023-11-20 NOTE — Plan of Care (Signed)

## 2023-11-21 DIAGNOSIS — J441 Chronic obstructive pulmonary disease with (acute) exacerbation: Secondary | ICD-10-CM | POA: Diagnosis not present

## 2023-11-21 DIAGNOSIS — J9601 Acute respiratory failure with hypoxia: Secondary | ICD-10-CM

## 2023-11-21 DIAGNOSIS — I5031 Acute diastolic (congestive) heart failure: Secondary | ICD-10-CM

## 2023-11-21 DIAGNOSIS — C189 Malignant neoplasm of colon, unspecified: Secondary | ICD-10-CM

## 2023-11-21 DIAGNOSIS — D62 Acute posthemorrhagic anemia: Secondary | ICD-10-CM

## 2023-11-21 LAB — CBC WITH DIFFERENTIAL/PLATELET
Abs Immature Granulocytes: 1.53 10*3/uL — ABNORMAL HIGH (ref 0.00–0.07)
Basophils Absolute: 0.1 10*3/uL (ref 0.0–0.1)
Basophils Relative: 1 %
Eosinophils Absolute: 1.4 10*3/uL — ABNORMAL HIGH (ref 0.0–0.5)
Eosinophils Relative: 8 %
HCT: 22.3 % — ABNORMAL LOW (ref 36.0–46.0)
Hemoglobin: 7.2 g/dL — ABNORMAL LOW (ref 12.0–15.0)
Immature Granulocytes: 9 %
Lymphocytes Relative: 5 %
Lymphs Abs: 0.8 10*3/uL (ref 0.7–4.0)
MCH: 27.5 pg (ref 26.0–34.0)
MCHC: 32.3 g/dL (ref 30.0–36.0)
MCV: 85.1 fL (ref 80.0–100.0)
Monocytes Absolute: 1.6 10*3/uL — ABNORMAL HIGH (ref 0.1–1.0)
Monocytes Relative: 10 %
Neutro Abs: 11 10*3/uL — ABNORMAL HIGH (ref 1.7–7.7)
Neutrophils Relative %: 67 %
Platelets: 451 10*3/uL — ABNORMAL HIGH (ref 150–400)
RBC: 2.62 MIL/uL — ABNORMAL LOW (ref 3.87–5.11)
RDW: 15.9 % — ABNORMAL HIGH (ref 11.5–15.5)
Smear Review: NORMAL
WBC: 16.4 10*3/uL — ABNORMAL HIGH (ref 4.0–10.5)
nRBC: 0.2 % (ref 0.0–0.2)

## 2023-11-21 LAB — PHOSPHORUS: Phosphorus: 3.4 mg/dL (ref 2.5–4.6)

## 2023-11-21 LAB — BASIC METABOLIC PANEL WITH GFR
Anion gap: 10 (ref 5–15)
BUN: 15 mg/dL (ref 8–23)
CO2: 26 mmol/L (ref 22–32)
Calcium: 7.9 mg/dL — ABNORMAL LOW (ref 8.9–10.3)
Chloride: 102 mmol/L (ref 98–111)
Creatinine, Ser: 0.54 mg/dL (ref 0.44–1.00)
GFR, Estimated: >60 mL/min (ref >60)
Glucose, Bld: 150 mg/dL — ABNORMAL HIGH (ref 70–99)
Potassium: 4.2 mmol/L (ref 3.5–5.1)
Sodium: 138 mmol/L (ref 135–145)

## 2023-11-21 LAB — GLUCOSE, CAPILLARY
Glucose-Capillary: 152 mg/dL — ABNORMAL HIGH (ref 70–99)
Glucose-Capillary: 176 mg/dL — ABNORMAL HIGH (ref 70–99)
Glucose-Capillary: 174 mg/dL — ABNORMAL HIGH (ref 70–99)

## 2023-11-21 LAB — MAGNESIUM: Magnesium: 1.9 mg/dL (ref 1.7–2.4)

## 2023-11-21 MED ORDER — TRAVASOL 10 % IV SOLN
INTRAVENOUS | Status: AC
  Filled 2023-11-21: qty 873.6

## 2023-11-21 MED ORDER — IPRATROPIUM-ALBUTEROL 0.5-2.5 (3) MG/3ML IN SOLN
3.0000 mL | Freq: Four times a day (QID) | RESPIRATORY_TRACT | Status: DC | PRN
  Filled 2023-11-21: qty 3

## 2023-11-21 MED ORDER — FUROSEMIDE 10 MG/ML IJ SOLN
40.0000 mg | Freq: Once | INTRAMUSCULAR | Status: AC
  Administered 2023-11-21: 40 mg via INTRAVENOUS
  Filled 2023-11-21: qty 4

## 2023-11-21 NOTE — Progress Notes (Addendum)
 Progress Note   Patient: Kristin Miller EAV:[id number] DOB: 12/15/1935 DOA: 11/13/2023     8 DOS: the patient was seen and examined on 11/21/2023   Brief hospital course:  Pt. with PMH of hypertension, colon cancer, adenocarcinoma s/p resection and colostomy, right breast cancer, T-spine fracture, left shoulder BCC, admitted on 11/13/2023, with complaint of leaking of ostomy site patient was admitted by general surgery for ostomy revision, was found to have dyspnea and hypoxia.  So TRH was consulted for further management.   Assessment and Plan:  Acute hypoxic respiratory failure - Initially requiring 2 L nasal cannula.  Likely multifactorial etiology given underlying COPD as well as mild exacerbation of HFpEF.  Wean O2 as tolerated.  Acute exacerbation of HFpEF - Likely secondary to aggressive volume resuscitation.  Chest x-ray personally reviewed noting congestion with mild pleural effusion.  Mild lower extremity edema.  Responded well to IV Lasix  x 1, diuresing nearly 3 L.  Minimally net negative however given IV fluid intake.  Will resume IV Lasix  40 mg again today.  Monitor response.  Supplemental O2 as above.  COPD exacerbation - Noted wheezing.  Likely contributing to hypoxia.  Showing marked improvement with nebulizer therapy.  Supplemental O2 as above.  Acute blood loss anemia - Likely secondary to surgical intervention and marrow stenting with hemodilution.  Minimal drainage from drains.  No active bleeding appreciated.  Hemoglobin downtrending.  Lasix  on board.  Will recheck hemoglobin in AM.  Transfuse if less than 7.  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  IR placed BL pigtail drains 5/2.  Currently minimal output.  Pain appears well-controlled.  Continues NPO.  TPN on board.   PT/OT following closely.  Adenocarcinoma of the colon - S/p robotic assisted laparoscopic partial colectomy 10/27/23.  Readmitted 4/25 for ostomy revision secondary to necrosis of end colostomy.   Subjective: Patient resting comfortably this morning.  States she feels a bit less short of breath.  Denies any fever, chills, chest pain, nausea, vomiting.  Has abdominal pain exacerbated with movement but otherwise well-controlled.  Minimal if no output from newly placed pigtail drains.  Urine output excellent.  Physical Exam: Vitals:   11/21/23 0314 11/21/23 0440 11/21/23 0734 11/21/23 0803  BP: (!) 138/55   134/69  Pulse: 87   82  Resp: 16   18  Temp: 98.6 F (37 C)   98.4 F (36.9 C)  TempSrc: Oral   Oral  SpO2: 95%  96% 94%  Weight:  66.9 kg    Height:        GENERAL:  Alert, pleasant, no acute distress  HEENT:  EOMI CARDIOVASCULAR:  RRR, no murmurs appreciated RESPIRATORY:  Clear to auscultation, no wheezing, rales, or rhonchi GASTROINTESTINAL:  Soft, diffusely minimally tender, stoma present, bilateral drains present with minimal drainage, anterior surgical incisions, fistula present with mild drainage EXTREMITIES:  No LE edema bilaterally NEURO:  No new focal deficits appreciated SKIN:  No rashes noted PSYCH:  Appropriate mood and affect     Data Reviewed:  No new imaging to review  Labs: CBC: Recent Labs  Lab 11/20/23 0501 11/21/23 0704  WBC 17.5* 16.4*  NEUTROABS  --  11.0*  HGB 8.5* 7.2*  HCT 26.0* 22.3*  MCV 85.5 85.1  PLT 546* 451*   Basic Metabolic Panel: Recent Labs  Lab  11/17/23 0439 11/18/23 0422 11/19/23 0516 11/20/23 0501 11/20/23 0706 11/21/23 0704  NA 139 141 142 138  --  138  K 3.6 3.5 4.0 4.1  --  4.2  CL 109 110 109 104  --  102  CO2 21* 24 22 23   --  26  GLUCOSE 96 153* 122* 134*  --  150*  BUN 14 15 15 13   --  15  CREATININE 0.58 0.61 0.65 0.62  --  0.54  CALCIUM 6.8* 7.0* 7.3* 7.9*  --  7.9*  MG 2.6* 2.4 2.1  --  1.9 1.9  PHOS  2.4* 2.2* 2.6  --  2.9 3.4   Liver Function Tests: Recent Labs  Lab 11/15/23 0457 11/16/23 0608 11/18/23 0422 11/19/23 0516  AST 15 13* 16 19  ALT 8 9 9 9   ALKPHOS 38 44 52 63  BILITOT 0.6 0.7 0.3 0.3  PROT 4.5* 4.7* 4.9* 5.3*  ALBUMIN  1.9* 1.7* 1.7* 1.7*   CBG: Recent Labs  Lab 11/20/23 0529 11/20/23 1140 11/20/23 1811 11/20/23 2312 11/21/23 0518  GLUCAP 134* 157* 136* 188* 152*    Scheduled Meds:  acetaminophen   1,000 mg Oral Q6H   alteplase   2 mg Intracatheter Once   amLODipine   5 mg Oral Daily   arformoterol   15 mcg Nebulization BID   budesonide  (PULMICORT ) nebulizer solution  0.25 mg Nebulization BID   enoxaparin  (LOVENOX ) injection  40 mg Subcutaneous Q24H   insulin  aspart  0-9 Units Subcutaneous Q6H   latanoprost   1 drop Both Eyes QHS   loperamide   4 mg Oral Q6H   octreotide   200 mcg Subcutaneous Q8H   pantoprazole  (PROTONIX ) IV  40 mg Intravenous Q12H   sodium chloride  flush  5 mL Intracatheter Q8H   Continuous Infusions:  ampicillin -sulbactam (UNASYN ) IV 3 g (11/21/23 0517)   fluconazole  (DIFLUCAN ) IV 200 mg (11/21/23 0845)   TPN ADULT (ION) 65 mL/hr at 11/20/23 1826   TPN ADULT (ION)     PRN Meds:.diphenhydrAMINE  **OR** diphenhydrAMINE , hydrALAZINE , ipratropium-albuterol , methocarbamol  **OR** methocarbamol  (ROBAXIN ) injection, morphine  injection, ondansetron  (ZOFRAN ) IV, mouth rinse, oxyCODONE , prochlorperazine  **OR** prochlorperazine   Family Communication: Family at bedside  Disposition: Status is: Inpatient Remains inpatient appropriate because: Acute hypoxic respiratory failure     Time spent: 39 minutes  Author: Jodeane Mulligan, DO 11/21/2023 11:30 AM  For on call review [email or web address].

## 2023-11-21 NOTE — Progress Notes (Signed)
 Patient ID: Kristin Miller, female   DOB: 1935-12-10, 88 y.o.   MRN: [id number]     SURGICAL PROGRESS NOTE   Hospital Day(s): 8.   Interval History: Patient seen and examined, no acute events or new complaints overnight. Patient reports feeling better than yesterday.  He still have some soreness in the abdomen.  No specific pain location.  No pain radiation.  Patient cannot identify any alleviating or aggravating factors.  Patient had a CT scan of the abdomen and pelvis showing multiple fluid collection.  She had 2 additional drains placed yesterday by interventional radiology.  This 2 new drains are empty.  Continue bilious output from the surgical drains.  Vital signs in last 24 hours: [min-max] current  Temp:  [98.3 F (36.8 C)-99.3 F (37.4 C)] 98.4 F (36.9 C) (05/03 0803) Pulse Rate:  [82-96] 82 (05/03 0803) Resp:  [16-22] 18 (05/03 0803) BP: (112-139)/(54-69) 134/69 (05/03 0803) SpO2:  [92 %-98 %] 94 % (05/03 0803) FiO2 (%):  [94 %] 94 % (05/02 1521) Weight:  [66.9 kg-70.5 kg] 66.9 kg (05/03 0440)     Height: 5' (152.4 cm) Weight: 66.9 kg BMI (Calculated): 28.8   Physical Exam:  Constitutional: alert, cooperative and no distress  Respiratory: breathing non-labored at rest  Cardiovascular: regular rate and sinus rhythm  Gastrointestinal: soft, non-tender, and non-distended  Labs:     Latest Ref Rng & Units 11/21/2023    7:04 AM 11/20/2023    5:01 AM 11/14/2023    5:50 AM  CBC  WBC 4.0 - 10.5 K/uL 16.4  17.5  13.3   Hemoglobin 12.0 - 15.0 g/dL 7.2  8.5  03.4   Hematocrit 36.0 - 46.0 % 22.3  26.0  30.8   Platelets 150 - 400 K/uL 451  546  745       Latest Ref Rng & Units 11/20/2023    5:01 AM 11/19/2023    5:16 AM 11/18/2023    4:22 AM  CMP  Glucose 70 - 99 mg/dL 742  595  638   BUN 8 - 23 mg/dL 13  15  15    Creatinine 0.44 - 1.00 mg/dL 7.56  4.33  2.95   Sodium 135 - 145 mmol/L 138  142  141   Potassium 3.5 - 5.1 mmol/L 4.1  4.0  3.5   Chloride 98 - 111 mmol/L 104  109   110   CO2 22 - 32 mmol/L 23  22  24    Calcium 8.9 - 10.3 mg/dL 7.9  7.3  7.0   Total Protein 6.5 - 8.1 g/dL  5.3  4.9   Total Bilirubin 0.0 - 1.2 mg/dL  0.3  0.3   Alkaline Phos 38 - 126 U/L  63  52   AST 15 - 41 U/L  19  16   ALT 0 - 44 U/L  9  9     Imaging studies: 2 new drains placed yesterday by interventional radiology in 2 new right-sided fluid collections.  I personally evaluated the images.   Assessment/Plan:  88 y.o. female with ischemia of colostomy 8 Days Post-Op s/p revision of colostomy with new colectomy with creation of end colostomy, complicated by pertinent comorbidities including enterocutaneous fistula.  -Patient today without clinical toleration.  Stable vital signs.  No fever - Pain under control - Still with moderate output enterocutaneous fistula.  Output in last 24 hours 375.  This is coming mainly from the original surgical drains.  No significant output from the  new drains - Will continue with IV antibiotic therapy.  Continue bowel rest.  Continue NPO.  Continue TPN.  Continue octreotide . - Will add PT/OT consult to mobilize patient and regain strength due to weakness after surgery. - Continue DVT prophylaxis  Lucila Rye, MD

## 2023-11-21 NOTE — Consult Note (Signed)
 PHARMACY - TOTAL PARENTERAL NUTRITION CONSULT NOTE   Indication: Prolonged ileus  Patient Measurements: Height: 5' (152.4 cm) Weight: 66.9 kg (147 lb 7.8 oz) IBW/kg (Calculated) : 45.5 TPN AdjBW (KG): 51.8 Body mass index is 28.8 kg/m. Usual Weight: 64.9  Assessment:  Kristin Miller is a 88 yo female who presented with colostomy issues w separation of mucocutaneous junction and distal necrosis post parietal colectomy due to adenocarcinoma of colon. Previously colectomy was 4/8. They underwent revision of colostomy on 4/25. Pharmacy has been consulted to manage this patient's TPN.   Glucose / Insulin : BG 136-188 7 units of novolog  in the last 24 hours,  sSSI q6h.  Electrolytes: WNL Corrected Ca: 9.7   (Ca 7.9  alb 1.7) Renal: Scr < 1 Hepatic: WNL Intake / Output; MIVF: No MIVF at this time  Intake/Output Summary (Last 24 hours) at 11/21/2023 1102 Last data filed at 11/21/2023 0517 Gross per 24 hour  Intake 2945.88 ml  Output 3225 ml  Net -279.12 ml    GI Imaging: 4/23: CT Abdomen Pelvis 1. Surgical changes from a left colectomy and left lower colostomy. The distal colostomy is abnormal. Beginning approximately 10 cm from the ostomy site the colon is very amorphous and is filled with fluid. There are abnormal gas collections around the bowel and there is a large collection of gas in the subcutaneous tissues. Findings very worrisome for necrosis of the distal 10 cm of colon. 4/30 CT abd/pel: --Multiple fluid collections are noted throughout the peritoneal space, concerning for possible abscesses.  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, complicated by pertinent comorbid conditions   --2 new drains placed yesterday 5/2 by interventional radiology in 2 new right-sided fluid collections   Central access: Order  for IR to place PICC TPN start date: 4/29  Nutritional Goals: Goal TPN rate is 65 mL/hr (provides 87 g of protein and 1666 kcals per day)  RD Assessment: Estimated Needs Total Energy Estimated Needs: 1600-1800kcal/day Total Protein Estimated Needs: 80-90g/day Total Fluid Estimated Needs: 1.4-1.6L/day  Current Nutrition:  NPO  Plan:  Continue custom TPN at goal at 65 mL/hr at 1800 Electrolytes in TPN: Na 11mEq/L, K 46mEq/L, Ca 16mEq/L, Mg 5 mEq/L, and Phos 15. Cl:Ac 1:1 Add standard MVI and trace elements to TPN Initiate Sensitive q6h SSI and adjust as needed  Thiamine  100 mg IV daily x 5 days per dietary (completed) Add chromium 10 mcg per dietary recommendations  No MVIF at this time Monitor TPN labs on Mon/Thurs, daily until stable  Neveen Daponte A, PharmD 11/21/2023,11:02 AM

## 2023-11-21 NOTE — Evaluation (Signed)
 Occupational Therapy Evaluation Patient Details Name: Kristin Miller MRN: [id number] DOB: 1935-11-26 Today's Date: 11/21/2023   History of Present Illness   Kristin Miller presented to [facility] on 4/25 for planned surgery, with the diagnosis of Colostomy complication; now s/p revision. Other dx include back pain, glaucoma, HTN, COPD.     Clinical Impressions Patient received for OT evaluation. See flowsheet below for details of function. Generally, patient requiring MOD A for bed mobility, unable to perform functional mobility today 2/2 weakness, scooted at EOB with rest breaks, and MOD-MAX A overall for ADLs. Patient will benefit from continued OT while in acute care.     If plan is discharge home, recommend the following:   A lot of help with walking and/or transfers;A lot of help with bathing/dressing/bathroom;Assistance with cooking/housework;Direct supervision/assist for medications management;Direct supervision/assist for financial management;Assist for transportation;Help with stairs or ramp for entrance     Functional Status Assessment   Patient has had a recent decline in their functional status and demonstrates the ability to make significant improvements in function in a reasonable and predictable amount of time.     Equipment Recommendations   Other (comment) (defer to next venue of care)     Recommendations for Other Services         Precautions/Restrictions   Precautions Precautions: Fall;Other (comment) (watch O2) Recall of Precautions/Restrictions: Impaired Restrictions Weight Bearing Restrictions Per Provider Order: No     Mobility Bed Mobility Overal bed mobility: Needs Assistance Bed Mobility: Supine to Sit, Sit to Supine     Supine to sit: Mod assist, Used rails Sit to supine: Mod assist, Used rails   General bed mobility comments: Fatigued; weak; requires assist    Transfers                   General transfer comment: Pt  unable to transfer to standing today; required rest breaks and cues for scooting EOB (towards HOB) at end of session approx 2 feet to the R.      Balance Overall balance assessment: Mild deficits observed, not formally tested                                         ADL either performed or assessed with clinical judgement   ADL Overall ADL's : Needs assistance/impaired     Grooming: Oral care;Set up;Supervision/safety;Sitting Grooming Details (indicate cue type and reason): seated at EOB             Lower Body Dressing: Total assistance;Bed level Lower Body Dressing Details (indicate cue type and reason): unable to reach feet today             Functional mobility during ADLs:  (unable to attempt today.) General ADL Comments: Pt very fatigued and short of breath, especially while seated EOB x 15 minutes. Pt O2 saturation remaining 90-92% on 2L while seated EOB (same as bed level), but pt feeling short of breath while sitting up. RN notified. Pt does have low hemoglobin today (7.2). Pt initially agreeable to try standing for grooming, but she stated she didn't think she could with her fatigue and shortness of breath. Deferred standing today.     Vision Patient Visual Report: No change from baseline       Perception         Praxis         Pertinent Vitals/Pain Pain  Assessment Pain Assessment: 0-10 Pain Score: 3  Pain Location: abdomen Pain Descriptors / Indicators: Aching Pain Intervention(s): Limited activity within patient's tolerance, Repositioned     Extremity/Trunk Assessment Upper Extremity Assessment Upper Extremity Assessment: Generalized weakness   Lower Extremity Assessment Lower Extremity Assessment: Generalized weakness   Cervical / Trunk Assessment Cervical / Trunk Assessment: Normal   Communication Communication Communication: No apparent difficulties   Cognition Arousal: Alert Behavior During Therapy: WFL for tasks  assessed/performed Cognition: No apparent impairments             OT - Cognition Comments: Pt able to provide history information and follow all cues. Orientation not explicitly tested, although pt did say that her husband helps her with hearing medical information and decision-making, so she appears not confident about her ability to comprehend all medical information. Pt is very pleasant and motivated.                 Following commands: Intact       Cueing  General Comments   Cueing Techniques: Verbal cues;Tactile cues;Visual cues  OT managed multiple drains during session.   Exercises     Shoulder Instructions      Home Living Family/patient expects to be discharged to:: Private residence Living Arrangements: Spouse/significant other Available Help at Discharge: Family Type of Home: House Home Access: Stairs to enter [employer] of Steps: 4 Entrance Stairs-Rails:  (unilateral) Home Layout: Two level;Full bath on main level;Able to live on main level with bedroom/bathroom Alternate Level Stairs-Number of Steps: flight   Bathroom Shower/Tub: Producer, television/film/video: Handicapped height     Home Equipment: Other (comment);Shower seat;Rolling Environmental consultant (2 wheels) (3 wheeled walker)   Additional Comments: Husband at home with her; son lives nearby as well and can assist PRN.      Prior Functioning/Environment Prior Level of Function : Needs assist             Mobility Comments: Pt using 3ww most of the time; got out in the community as needed. Denies falls. ADLs Comments: Pt mostly (I) BADLs, although sometimes help with bra and with socks/shoes. Uses shower seat at baseline. Family assists with IADLs such as laundry, groceries, trash, etc. Pt states she is able to drive at baseline and does so. She enjoys stained glass making, which involves a combination of sitting/standing.    OT Problem List: Decreased strength;Decreased range of  motion;Decreased activity tolerance;Impaired balance (sitting and/or standing)   OT Treatment/Interventions: Self-care/ADL training;Therapeutic exercise;DME and/or AE instruction;Therapeutic activities;Patient/family education      OT Goals(Current goals can be found in the care plan section)   Acute Rehab OT Goals Patient Stated Goal: Get better to get back home OT Goal Formulation: With patient Time For Goal Achievement: 12/05/23 Potential to Achieve Goals: Good ADL Goals Pt Will Perform Grooming: with modified independence;standing Pt Will Perform Lower Body Bathing: with modified independence;sit to/from stand Pt Will Perform Lower Body Dressing: with modified independence;sit to/from stand Pt Will Transfer to Toilet: with modified independence;ambulating Pt Will Perform Toileting - Clothing Manipulation and hygiene: with modified independence;sit to/from stand   OT Frequency:  Min 3X/week    Co-evaluation              AM-PAC OT "6 Clicks" Daily Activity     Outcome Measure Help from another person eating meals?: None Help from another person taking care of personal grooming?: A Little Help from another person toileting, which includes using toliet, bedpan, or urinal?:  Total Help from another person bathing (including washing, rinsing, drying)?: A Lot Help from another person to put on and taking off regular upper body clothing?: A Lot Help from another person to put on and taking off regular lower body clothing?: Total 6 Click Score: 13   End of Session Nurse Communication: Mobility status;Other (comment) (pt's shortness of breath)  Activity Tolerance: Patient limited by fatigue Patient left: in bed;with call bell/phone within reach;with bed alarm set;with nursing/sitter in room (nurse had just entered the room)  OT Visit Diagnosis: Muscle weakness (generalized) (M62.81)                Time: 1610-9604 OT Time Calculation (min): 47 min Charges:  OT General  Charges $OT Visit: 1 Visit OT Evaluation $OT Eval Moderate Complexity: 1 Mod OT Treatments $Self Care/Home Management : 8-22 mins $Therapeutic Activity: 8-22 mins  Erik Havens, MS, OTR/L  Loree Roe 11/21/2023, 3:11 PM

## 2023-11-21 NOTE — Hospital Course (Signed)
 Pt. with PMH of hypertension, colon cancer, adenocarcinoma s/p resection and colostomy, right breast cancer, T-spine fracture, left shoulder BCC, admitted on 11/13/2023, with complaint of leaking of ostomy site patient was admitted by general surgery for ostomy revision, was found to have dyspnea and hypoxia.  So TRH was consulted for further management.    Assessment and Plan:   Acute hypoxic respiratory failure - Initially requiring 2 L nasal cannula.  Likely multifactorial etiology given underlying COPD as well as mild exacerbation of HFpEF.  Wean O2 as tolerated.   Acute exacerbation of HFpEF - Likely secondary to aggressive volume resuscitation.  Chest x-ray personally reviewed noting congestion with mild pleural effusion.  Mild lower extremity edema.  Responded well to IV Lasix  x 1, diuresing nearly 3 L.  Minimally net negative however given IV fluid intake.  Will resume IV Lasix  40 mg again today.  Monitor response.  Supplemental O2 as above.   COPD exacerbation - Noted wheezing.  Likely contributing to hypoxia.  Showing marked improvement with nebulizer therapy.  Supplemental O2 as above.   Acute blood loss anemia - Likely secondary to surgical intervention and marrow stenting with hemodilution.  Minimal drainage from drains.  No active bleeding appreciated.  Hemoglobin downtrending.  Lasix  on board.  Will recheck hemoglobin in AM.  Transfuse if less than 7.   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  IR placed BL pigtail drains 5/2.  Currently minimal output.  Pain appears well-controlled.  Continues NPO.  TPN on board.  PT/OT following closely.   Adenocarcinoma of the colon - S/p robotic assisted laparoscopic partial colectomy 10/27/23.  Readmitted 4/25 for ostomy revision secondary to  necrosis of end colostomy.

## 2023-11-22 DIAGNOSIS — J9601 Acute respiratory failure with hypoxia: Secondary | ICD-10-CM | POA: Diagnosis not present

## 2023-11-22 DIAGNOSIS — I5033 Acute on chronic diastolic (congestive) heart failure: Secondary | ICD-10-CM

## 2023-11-22 DIAGNOSIS — L02211 Cutaneous abscess of abdominal wall: Secondary | ICD-10-CM | POA: Diagnosis not present

## 2023-11-22 DIAGNOSIS — J441 Chronic obstructive pulmonary disease with (acute) exacerbation: Secondary | ICD-10-CM | POA: Diagnosis not present

## 2023-11-22 LAB — CBC WITH DIFFERENTIAL/PLATELET
Abs Immature Granulocytes: 1.43 10*3/uL — ABNORMAL HIGH (ref 0.00–0.07)
Basophils Absolute: 0.1 10*3/uL (ref 0.0–0.1)
Basophils Relative: 1 %
Eosinophils Absolute: 1.3 10*3/uL — ABNORMAL HIGH (ref 0.0–0.5)
Eosinophils Relative: 6 %
HCT: 24.2 % — ABNORMAL LOW (ref 36.0–46.0)
Hemoglobin: 7.8 g/dL — ABNORMAL LOW (ref 12.0–15.0)
Immature Granulocytes: 7 %
Lymphocytes Relative: 4 %
Lymphs Abs: 0.9 10*3/uL (ref 0.7–4.0)
MCH: 27.3 pg (ref 26.0–34.0)
MCHC: 32.2 g/dL (ref 30.0–36.0)
MCV: 84.6 fL (ref 80.0–100.0)
Monocytes Absolute: 1.9 10*3/uL — ABNORMAL HIGH (ref 0.1–1.0)
Monocytes Relative: 9 %
Neutro Abs: 15.4 10*3/uL — ABNORMAL HIGH (ref 1.7–7.7)
Neutrophils Relative %: 73 %
Platelets: 458 10*3/uL — ABNORMAL HIGH (ref 150–400)
RBC: 2.86 MIL/uL — ABNORMAL LOW (ref 3.87–5.11)
RDW: 15.9 % — ABNORMAL HIGH (ref 11.5–15.5)
Smear Review: NORMAL
WBC: 21.1 10*3/uL — ABNORMAL HIGH (ref 4.0–10.5)
nRBC: 0 % (ref 0.0–0.2)

## 2023-11-22 LAB — GLUCOSE, CAPILLARY
Glucose-Capillary: 148 mg/dL — ABNORMAL HIGH (ref 70–99)
Glucose-Capillary: 149 mg/dL — ABNORMAL HIGH (ref 70–99)
Glucose-Capillary: 157 mg/dL — ABNORMAL HIGH (ref 70–99)
Glucose-Capillary: 159 mg/dL — ABNORMAL HIGH (ref 70–99)

## 2023-11-22 MED ORDER — FUROSEMIDE 10 MG/ML IJ SOLN
40.0000 mg | Freq: Once | INTRAMUSCULAR | Status: AC
  Administered 2023-11-22: 40 mg via INTRAVENOUS
  Filled 2023-11-22: qty 4

## 2023-11-22 MED ORDER — TRACE MINERALS CU-MN-SE-ZN 300-55-60-3000 MCG/ML IV SOLN
INTRAVENOUS | Status: AC
  Filled 2023-11-22: qty 582.4

## 2023-11-22 NOTE — Progress Notes (Signed)
 Patient ID: Kristin Miller, female   DOB: [birth year], 88 y.o.   MRN: [id number]     SURGICAL PROGRESS NOTE   Hospital Day(s): 9.   Interval History: Patient seen and examined, no acute events or new complaints overnight. Patient reports feeling okay.  She denies any new complaints today.  She denies any significant abdominal pain.  She cannot recall if she is passing gas through the ostomy.  No stool in bag.  Denies any fever.  Vital signs in last 24 hours: [min-max] current  Temp:  [98.8 F (37.1 C)-99.5 F (37.5 C)] 98.8 F (37.1 C) (05/04 0317) Pulse Rate:  [93-103] 93 (05/04 0317) Resp:  [20] 20 (05/04 0317) BP: (147-151)/(51-62) 147/62 (05/04 0317) SpO2:  [94 %-97 %] 97 % (05/04 0708)     Height: 5' (152.4 cm) Weight: 66.9 kg BMI (Calculated): 28.8   Physical Exam:  Constitutional: alert, cooperative and no distress  Respiratory: breathing non-labored at rest  Cardiovascular: regular rate and sinus rhythm  Gastrointestinal: soft, non-tender, and non-distended.  Multiple drains in place.  Percutaneous drain placed by IR with serous output.  Surgical drains with bilious output.  The ostomy is pink and patent.  Labs:     Latest Ref Rng & Units 11/22/2023    4:11 AM 11/21/2023    7:04 AM 11/20/2023    5:01 AM  CBC  WBC 4.0 - 10.5 K/uL 21.1  16.4  17.5   Hemoglobin 12.0 - 15.0 g/dL 7.8  7.2  8.5   Hematocrit 36.0 - 46.0 % 24.2  22.3  26.0   Platelets 150 - 400 K/uL 458  451  546       Latest Ref Rng & Units 11/21/2023    7:04 AM 11/20/2023    5:01 AM 11/19/2023    5:16 AM  CMP  Glucose 70 - 99 mg/dL 956  387  564   BUN 8 - 23 mg/dL 15  13  15    Creatinine 0.44 - 1.00 mg/dL 3.32  9.51  8.84   Sodium 135 - 145 mmol/L 138  138  142   Potassium 3.5 - 5.1 mmol/L 4.2  4.1  4.0   Chloride 98 - 111 mmol/L 102  104  109   CO2 22 - 32 mmol/L 26  23  22    Calcium 8.9 - 10.3 mg/dL 7.9  7.9  7.3   Total Protein 6.5 - 8.1 g/dL   5.3   Total Bilirubin 0.0 - 1.2 mg/dL   0.3   Alkaline Phos 38 -  126 U/L   63   AST 15 - 41 U/L   19   ALT 0 - 44 U/L   9     Imaging studies: No new pertinent imaging studies   Assessment/Plan:  88 y.o. female with ischemia of colostomy 9 Days Post-Op s/p revision of colostomy with new colectomy with creation of end colostomy, complicated by pertinent comorbidities including enterocutaneous fistula.   -Patient continue without clinical toleration.  Stable vital signs.  No fever - Pain under control - Still with moderate output enterocutaneous fistula.  Output in last 24 hours 245 which is decreased compared to yesterday.  This is coming mainly from the original surgical drains.  No significant output from the new drains - Will continue with IV antibiotic therapy.  Continue bowel rest.  Continue NPO.  Continue TPN.  Continue octreotide . -Monitor white blood cell count.  Increasing to 10 1000 today.  No fever. - Will add  PT/OT consult to mobilize patient and regain strength due to weakness after surgery.  Lucila Rye, MD

## 2023-11-22 NOTE — Progress Notes (Signed)
 Progress Note   Patient: Kristin Miller MVH:[id number] DOB: 1936-06-17 DOA: 11/13/2023     9 DOS: the patient was seen and examined on 11/22/2023   Brief hospital course:  Pt. with PMH of hypertension, colon cancer, adenocarcinoma s/p resection and colostomy, right breast cancer, T-spine fracture, left shoulder BCC, admitted on 11/13/2023, with complaint of leaking of ostomy site patient was admitted by general surgery for ostomy revision, was found to have dyspnea and hypoxia.  So TRH was consulted for further management.    Assessment and Plan:   Acute hypoxic respiratory failure - Initially requiring 2 L nasal cannula.  Likely multifactorial etiology given underlying COPD as well as mild exacerbation of HFpEF.  Showing mild improvement this morning.  Attempted room air however O2 sat was at 89%.  Placed back on 1-2 L.  Wean O2 as tolerated.   Acute exacerbation of HFpEF - Likely secondary to aggressive volume resuscitation.  Chest x-ray personally reviewed noting congestion with mild pleural effusion.  Mild lower extremity edema.  Responded well to IV Lasix , diuresing greater than 4 L so far.  Will repeat IV Lasix  40 mg again today.  Monitor response.  Supplemental O2 as above.  Will recheck BMP and magnesium  in AM.   COPD exacerbation - Noted wheezing.  Likely contributing to hypoxia.  Showing marked improvement with nebulizer therapy.  Supplemental O2 as above.   Acute blood loss anemia - Likely secondary to surgical intervention and marrow stenting with hemodilution.  No sanguinous drainage from drains.  No active bleeding appreciated.  Hemoglobin showing mild improvement from yesterday.  Will recheck hemoglobin in AM.  Transfuse if less than 7.   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   IR placed BL pigtail drains 5/2.  Currently minimal output from new drains.  Previous drain showing copious biliary output.  Fistula still having output as well.  Pain appears well-controlled.  Continues NPO.  TPN on board.  PT/OT following closely.  Leukocytosis slightly worse this morning.   Adenocarcinoma of the colon - S/p robotic assisted laparoscopic partial colectomy 10/27/23.  Readmitted 4/25 for ostomy revision secondary to necrosis of end colostomy.   Subjective: Patient resting comfortably this morning.  Abdominal pain with movement but otherwise stable.  Breathing a bit easier than yesterday.  Attempted room air however O2 sat 89% so placed back on 1-2 L nasal cannula.  Coughing has improved.  Denies any fever, nausea, vomiting.  Physical Exam: Vitals:   11/21/23 1933 11/22/23 0317 11/22/23 0708 11/22/23 0904  BP: (!) 151/51 (!) 147/62  127/65  Pulse: (!) 103 93  90  Resp: 20 20  18   Temp: 99.5 F (37.5 C) 98.8 F (37.1 C)  98.4 F (36.9 C)  TempSrc:    Oral  SpO2: 94% 95% 97% 97%  Weight:      Height:        GENERAL:  Alert, pleasant, no acute distress  HEENT:  EOMI CARDIOVASCULAR:  RRR, no murmurs appreciated RESPIRATORY:  Clear to auscultation, no wheezing, rales, or rhonchi GASTROINTESTINAL:  Soft, diffusely minimally tender, stoma present, bilateral drains present, anterior surgical incisions, fistula present with mild drainage EXTREMITIES:  No LE edema bilaterally NEURO:  No new focal deficits appreciated SKIN:  No rashes noted PSYCH:  Appropriate mood and affect    Data Reviewed:  No new imaging to review  Labs: CBC: Recent Labs  Lab 11/20/23 0501 11/21/23 0704 11/22/23 0411  WBC 17.5* 16.4* 21.1*  NEUTROABS  --  11.0* 15.4*  HGB 8.5* 7.2* 7.8*  HCT 26.0* 22.3* 24.2*  MCV 85.5 85.1 84.6  PLT 546* 451* 458*   Basic Metabolic Panel: Recent Labs  Lab 11/17/23 0439 11/18/23 0422 11/19/23 0516 11/20/23 0501 11/20/23 0706 11/21/23 0704  NA 139  141 142 138  --  138  K 3.6 3.5 4.0 4.1  --  4.2  CL 109 110 109 104  --  102  CO2 21* 24 22 23   --  26  GLUCOSE 96 153* 122* 134*  --  150*  BUN 14 15 15 13   --  15  CREATININE 0.58 0.61 0.65 0.62  --  0.54  CALCIUM 6.8* 7.0* 7.3* 7.9*  --  7.9*  MG 2.6* 2.4 2.1  --  1.9 1.9  PHOS 2.4* 2.2* 2.6  --  2.9 3.4   Liver Function Tests: Recent Labs  Lab 11/16/23 0608 11/18/23 0422 11/19/23 0516  AST 13* 16 19  ALT 9 9 9   ALKPHOS 44 52 63  BILITOT 0.7 0.3 0.3  PROT 4.7* 4.9* 5.3*  ALBUMIN  1.7* 1.7* 1.7*   CBG: Recent Labs  Lab 11/21/23 1143 11/21/23 2124 11/22/23 0101 11/22/23 0526 11/22/23 1143  GLUCAP 176* 174* 149* 148* 159*    Scheduled Meds:  acetaminophen   1,000 mg Oral Q6H   alteplase   2 mg Intracatheter Once   amLODipine   5 mg Oral Daily   arformoterol   15 mcg Nebulization BID   budesonide  (PULMICORT ) nebulizer solution  0.25 mg Nebulization BID   enoxaparin  (LOVENOX ) injection  40 mg Subcutaneous Q24H   insulin  aspart  0-9 Units Subcutaneous Q6H   latanoprost   1 drop Both Eyes QHS   loperamide   4 mg Oral Q6H   octreotide   200 mcg Subcutaneous Q8H   pantoprazole  (PROTONIX ) IV  40 mg Intravenous Q12H   sodium chloride  flush  5 mL Intracatheter Q8H   Continuous Infusions:  ampicillin -sulbactam (UNASYN ) IV 3 g (11/22/23 1210)   fluconazole  (DIFLUCAN ) IV 200 mg (11/22/23 1027)   TPN ADULT (ION) 65 mL/hr at 11/21/23 1855   TPN ADULT (ION)     PRN Meds:.diphenhydrAMINE  **OR** diphenhydrAMINE , hydrALAZINE , ipratropium-albuterol , methocarbamol  **OR** methocarbamol  (ROBAXIN ) injection, morphine  injection, ondansetron  (ZOFRAN ) IV, mouth rinse, oxyCODONE , prochlorperazine  **OR** prochlorperazine   Family Communication: Husband at bedside  Disposition: Status is: Inpatient Remains inpatient appropriate because: Necrosis of abdominal ostomy     Time spent: 35 minutes  Author: Jodeane Mulligan, DO 11/22/2023 12:25 PM  For on call review [email or web address].

## 2023-11-22 NOTE — Consult Note (Signed)
 PHARMACY - TOTAL PARENTERAL NUTRITION CONSULT NOTE   Indication: Prolonged ileus  Patient Measurements: Height: 5' (152.4 cm) Weight: 66.9 kg (147 lb 7.8 oz) IBW/kg (Calculated) : 45.5 TPN AdjBW (KG): 51.8 Body mass index is 28.8 kg/m. Usual Weight: 64.9  Assessment:  NE is a 88 yo female who presented with colostomy issues w separation of mucocutaneous junction and distal necrosis post parietal colectomy due to adenocarcinoma of colon. Previously colectomy was 4/8. They underwent revision of colostomy on 4/25. Pharmacy has been consulted to manage this patient's TPN.   Glucose / Insulin : BG 148-176 5 units of novolog  in the last 24 hours,  sSSI q6h.  Electrolytes: no labs 5/4 5/3: Corrected Ca: 9.7   (Ca 7.9  alb 1.7) Renal: Scr < 1 Hepatic: WNL Intake / Output; MIVF: No MIVF at this time  Intake/Output Summary (Last 24 hours) at 11/22/2023 0804 Last data filed at 11/22/2023 0715 Gross per 24 hour  Intake --  Output 3195 ml  Net -3195 ml    GI Imaging: 4/23: CT Abdomen Pelvis 1. Surgical changes from a left colectomy and left lower colostomy. The distal colostomy is abnormal. Beginning approximately 10 cm from the ostomy site the colon is very amorphous and is filled with fluid. There are abnormal gas collections around the bowel and there is a large collection of gas in the subcutaneous tissues. Findings very worrisome for necrosis of the distal 10 cm of colon. 4/30 CT abd/pel: --Multiple fluid collections are noted throughout the peritoneal space, concerning for possible abscesses.  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, complicated by pertinent comorbid conditions   --2 new drains placed yesterday 5/2 by interventional radiology in 2 new right-sided fluid collections   Central access:  Order for IR to place PICC TPN start date: 4/29  Nutritional Goals: Goal TPN rate is 65 mL/hr (provides 87 g of protein and 1666 kcals per day)  RD Assessment: Estimated Needs Total Energy Estimated Needs: 1600-1800kcal/day Total Protein Estimated Needs: 80-90g/day Total Fluid Estimated Needs: 1.4-1.6L/day  Current Nutrition:  NPO  Plan:  Continue custom TPN at goal at 65 mL/hr at 1800 Electrolytes in TPN: Na 21mEq/L, K 15mEq/L, Ca 99mEq/L, Mg 5 mEq/L, and Phos 15. Cl:Ac 1:1 - Patient given furosemide  x1 the last 2 days, HFpEF,  will concentrate TPN 5/4 to reduce fluid amount Add standard MVI and trace elements to TPN Continue Sensitive q6h SSI and adjust as needed  Thiamine  100 mg IV daily x 5 days per dietary (completed) Add chromium 10 mcg per dietary recommendations  No MVIF at this time Monitor TPN labs on Mon/Thurs, daily until stable  Kristie Bracewell A, PharmD 11/22/2023,8:04 AM

## 2023-11-22 NOTE — Evaluation (Signed)
 Physical Therapy Evaluation Patient Details Name: Kristin Miller MRN: [id number] DOB: 02/28/1936 Today's Date: 11/22/2023  History of Present Illness  Kristin Miller presented to [facility] on 4/25 for planned surgery, with the diagnosis of Colostomy complication; now s/p revision. Other dx include back pain, glaucoma, HTN, COPD.  Clinical Impression  Pt is a pleasant 88 year old female who was admitted for planned revision of colostomy. Pt received supine in bed with multiple drains noted, all secured to gown. Pt performs bed mobility/transfers with min assist using RW and unable to ambulate at this time. Pt demonstrates deficits with fatigue/endurance. 2 reps of standing with side steps up towards HOB using RW. Supportive spouse at bedside. Pt is currently not at baseline level and would like as much therapy as possible to hopefully progress to home discharge. Explained process for this while admitted. Would benefit from skilled PT to address above deficits and promote optimal return to PLOF. Pt will continue to receive skilled PT services while admitted and will defer to TOC/care team for updates regarding disposition planning.       If plan is discharge home, recommend the following: A lot of help with walking and/or transfers;A lot of help with bathing/dressing/bathroom;Help with stairs or ramp for entrance   Can travel by private vehicle   No    Equipment Recommendations Rolling walker (2 wheels)  Recommendations for Other Services       Functional Status Assessment Patient has had a recent decline in their functional status and demonstrates the ability to make significant improvements in function in a reasonable and predictable amount of time.     Precautions / Restrictions Precautions Precautions: Fall;Other (comment) Recall of Precautions/Restrictions: Impaired Restrictions Weight Bearing Restrictions Per Provider Order: No      Mobility  Bed Mobility Overal bed mobility:  Needs Assistance Bed Mobility: Supine to Sit     Supine to sit: Min assist Sit to supine: Mod assist   General bed mobility comments: needs assist for trunkal elevation. Once seated, able to maintain with supervision. Follows commands well    Transfers Overall transfer level: Needs assistance Equipment used: Rolling walker (2 wheels) Transfers: Sit to/from Stand Sit to Stand: Min assist           General transfer comment: 2 reps for standing. Able to tolerate up to 2 mins. Fatigues quickly. All mobility performed with RW and without O2. O2 sats ranging 88-92% on RA    Ambulation/Gait               General Gait Details: unable  Stairs            Wheelchair Mobility     Tilt Bed    Modified Rankin (Stroke Patients Only)       Balance Overall balance assessment: Needs assistance Sitting-balance support: Feet supported Sitting balance-Leahy Scale: Good     Standing balance support: Bilateral upper extremity supported Standing balance-Leahy Scale: Fair                               Pertinent Vitals/Pain Pain Assessment Pain Assessment: No/denies pain    Home Living Family/patient expects to be discharged to:: Private residence Living Arrangements: Spouse/significant other Available Help at Discharge: Family Type of Home: House Home Access: Stairs to enter Entrance Stairs-Rails: Right Entrance Stairs-Number of Steps: 4 Alternate Level Stairs-Number of Steps: flight Home Layout: Two level;Full bath on main level;Able to live on main  level with bedroom/bathroom Home Equipment: Other (comment);Shower seat;Rolling Environmental consultant (2 wheels) (3 wheeled RW) Additional Comments: Husband at home with her; son lives nearby as well and can assist PRN.    Prior Function Prior Level of Function : Needs assist             Mobility Comments: Pt using 3ww most of the time; got out in the community as needed. Denies falls. ADLs Comments: Pt mostly  (I) BADLs, although sometimes help with bra and with socks/shoes. Uses shower seat at baseline. Family assists with IADLs such as laundry, groceries, trash, etc. Pt states she is able to drive at baseline and does so. She enjoys stained glass making, which involves a combination of sitting/standing.     Extremity/Trunk Assessment   Upper Extremity Assessment Upper Extremity Assessment: Generalized weakness    Lower Extremity Assessment Lower Extremity Assessment: Generalized weakness       Communication   Communication Communication: No apparent difficulties    Cognition Arousal: Alert Behavior During Therapy: WFL for tasks assessed/performed   PT - Cognitive impairments: No apparent impairments                       PT - Cognition Comments: very sweet and willing to participate Following commands: Intact       Cueing Cueing Techniques: Verbal cues, Tactile cues, Visual cues     General Comments      Exercises Other Exercises Other Exercises: supine ther-ex performed on B LEs including SLR and AP. 10 reps performed. Education given for continued performance   Assessment/Plan    PT Assessment Patient needs continued PT services  PT Problem List Decreased strength;Decreased balance;Decreased mobility;Decreased knowledge of use of DME       PT Treatment Interventions DME instruction;Gait training;Therapeutic exercise;Balance training    PT Goals (Current goals can be found in the Care Plan section)  Acute Rehab PT Goals Patient Stated Goal: to go home PT Goal Formulation: With patient Time For Goal Achievement: 12/06/23 Potential to Achieve Goals: Good    Frequency Min 2X/week     Co-evaluation               AM-PAC PT "6 Clicks" Mobility  Outcome Measure Help needed turning from your back to your side while in a flat bed without using bedrails?: A Little Help needed moving from lying on your back to sitting on the side of a flat bed without  using bedrails?: A Little Help needed moving to and from a bed to a chair (including a wheelchair)?: A Lot Help needed standing up from a chair using your arms (e.g., wheelchair or bedside chair)?: A Little Help needed to walk in hospital room?: A Lot Help needed climbing 3-5 steps with a railing? : Total 6 Click Score: 14    End of Session Equipment Utilized During Treatment: Oxygen Activity Tolerance: Patient tolerated treatment well Patient left: in bed;with family/visitor present;with SCD's reapplied Nurse Communication: Mobility status PT Visit Diagnosis: Muscle weakness (generalized) (M62.81);Difficulty in walking, not elsewhere classified (R26.2);Unsteadiness on feet (R26.81)    Time: 1610-9604 PT Time Calculation (min) (ACUTE ONLY): 28 min   Charges:   PT Evaluation $PT Eval Moderate Complexity: 1 Mod PT Treatments $Therapeutic Exercise: 8-22 mins PT General Charges $$ ACUTE PT VISIT: 1 Visit         Amparo Balk, PT, DPT, GCS [phone]   Kadey Mihalic 11/22/2023, 9:47 AM

## 2023-11-23 ENCOUNTER — Ambulatory Visit: Payer: Medicare HMO | Admitting: Family Medicine

## 2023-11-23 DIAGNOSIS — J9601 Acute respiratory failure with hypoxia: Secondary | ICD-10-CM | POA: Diagnosis not present

## 2023-11-23 DIAGNOSIS — I5033 Acute on chronic diastolic (congestive) heart failure: Secondary | ICD-10-CM | POA: Diagnosis not present

## 2023-11-23 DIAGNOSIS — C189 Malignant neoplasm of colon, unspecified: Secondary | ICD-10-CM | POA: Diagnosis not present

## 2023-11-23 LAB — CBC WITH DIFFERENTIAL/PLATELET
Abs Immature Granulocytes: 1.28 10*3/uL — ABNORMAL HIGH (ref 0.00–0.07)
Basophils Absolute: 0.1 10*3/uL (ref 0.0–0.1)
Basophils Relative: 1 %
Eosinophils Absolute: 0.8 10*3/uL — ABNORMAL HIGH (ref 0.0–0.5)
Eosinophils Relative: 3 %
HCT: 25.6 % — ABNORMAL LOW (ref 36.0–46.0)
Hemoglobin: 8.3 g/dL — ABNORMAL LOW (ref 12.0–15.0)
Immature Granulocytes: 5 %
Lymphocytes Relative: 4 %
Lymphs Abs: 1 10*3/uL (ref 0.7–4.0)
MCH: 27.3 pg (ref 26.0–34.0)
MCHC: 32.4 g/dL (ref 30.0–36.0)
MCV: 84.2 fL (ref 80.0–100.0)
Monocytes Absolute: 2.1 10*3/uL — ABNORMAL HIGH (ref 0.1–1.0)
Monocytes Relative: 9 %
Neutro Abs: 18.5 10*3/uL — ABNORMAL HIGH (ref 1.7–7.7)
Neutrophils Relative %: 78 %
Platelets: 496 10*3/uL — ABNORMAL HIGH (ref 150–400)
RBC: 3.04 MIL/uL — ABNORMAL LOW (ref 3.87–5.11)
RDW: 15.8 % — ABNORMAL HIGH (ref 11.5–15.5)
WBC: 23.8 10*3/uL — ABNORMAL HIGH (ref 4.0–10.5)
nRBC: 0 % (ref 0.0–0.2)

## 2023-11-23 LAB — COMPREHENSIVE METABOLIC PANEL WITH GFR
ALT: 8 U/L (ref 0–44)
AST: 15 U/L (ref 15–41)
Albumin: 2 g/dL — ABNORMAL LOW (ref 3.5–5.0)
Alkaline Phosphatase: 125 U/L (ref 38–126)
Anion gap: 12 (ref 5–15)
BUN: 16 mg/dL (ref 8–23)
CO2: 26 mmol/L (ref 22–32)
Calcium: 8.5 mg/dL — ABNORMAL LOW (ref 8.9–10.3)
Chloride: 98 mmol/L (ref 98–111)
Creatinine, Ser: 0.52 mg/dL (ref 0.44–1.00)
GFR, Estimated: >60 mL/min (ref >60)
Glucose, Bld: 188 mg/dL — ABNORMAL HIGH (ref 70–99)
Potassium: 4.6 mmol/L (ref 3.5–5.1)
Sodium: 136 mmol/L (ref 135–145)
Total Bilirubin: 0.4 mg/dL (ref 0.0–1.2)
Total Protein: 6.1 g/dL — ABNORMAL LOW (ref 6.5–8.1)

## 2023-11-23 LAB — GLUCOSE, CAPILLARY
Glucose-Capillary: 181 mg/dL — ABNORMAL HIGH (ref 70–99)
Glucose-Capillary: 152 mg/dL — ABNORMAL HIGH (ref 70–99)
Glucose-Capillary: 178 mg/dL — ABNORMAL HIGH (ref 70–99)
Glucose-Capillary: 187 mg/dL — ABNORMAL HIGH (ref 70–99)
Glucose-Capillary: 157 mg/dL — ABNORMAL HIGH (ref 70–99)

## 2023-11-23 LAB — PHOSPHORUS: Phosphorus: 4.3 mg/dL (ref 2.5–4.6)

## 2023-11-23 LAB — MAGNESIUM: Magnesium: 2 mg/dL (ref 1.7–2.4)

## 2023-11-23 LAB — TRIGLYCERIDES: Triglycerides: 74 mg/dL (ref <150)

## 2023-11-23 MED ORDER — TRACE MINERALS CU-MN-SE-ZN 300-55-60-3000 MCG/ML IV SOLN
INTRAVENOUS | Status: AC
  Filled 2023-11-23: qty 582.4

## 2023-11-23 MED ORDER — IRON SUCROSE 200 MG IVPB - SIMPLE MED
200.0000 mg | Freq: Once | Status: AC
Start: 2023-11-23 — End: 2023-11-23
  Administered 2023-11-23: 200 mg via INTRAVENOUS
  Filled 2023-11-23: qty 200

## 2023-11-23 NOTE — Progress Notes (Signed)
 CC: intestinal fistula  Subjective: Drains 200cc  Responded great to lasxi  Objective: Vital signs in last 24 hours: Temp:  [98.4 F (36.9 C)-99.7 F (37.6 C)] 98.4 F (36.9 C) (05/05 0743) Pulse Rate:  [84-97] 84 (05/05 0743) Resp:  [18] 18 (05/05 0743) BP: (117-138)/(52-65) 117/52 (05/05 0743) SpO2:  [95 %-97 %] 97 % (05/05 0755) Weight:  [59.5 kg] 59.5 kg (05/05 0252) Last BM Date : 11/22/23  Intake/Output from previous day: 05/04 0701 - 05/05 0700 In: 641.7 [I.V.:641.7] Out: 4330 [Urine:3000; Drains:200; Stool:30] Intake/Output this shift: No intake/output data recorded.  Physical exam:  Debilitated Abd: soft, bilious drainage, now Left side seems to be more active. Drain holding suction. No peritonitis. Surgical drains are the one controlling fistula, IR drain not doing much. Ostomy with good liquid output   Lab Results: CBC  Recent Labs    11/22/23 0411 11/23/23 0527  WBC 21.1* 23.8*  HGB 7.8* 8.3*  HCT 24.2* 25.6*  PLT 458* 496*   BMET Recent Labs    11/21/23 0704 11/23/23 0527  NA 138 136  K 4.2 4.6  CL 102 98  CO2 26 26  GLUCOSE 150* 188*  BUN 15 16  CREATININE 0.54 0.52  CALCIUM 7.9* 8.5*   PT/INR No results for input(s): "LABPROT", "INR" in the last 72 hours. ABG No results for input(s): "PHART", "HCO3" in the last 72 hours.  Invalid input(s): "PCO2", "PO2"  Studies/Results: No results found.  Anti-infectives: Anti-infectives (From admission, onward)    Start     Dose/Rate Route Frequency Ordered Stop   11/18/23 1015  fluconazole  (DIFLUCAN ) IVPB 200 mg        200 mg 100 mL/hr over 60 Minutes Intravenous Every 24 hours 11/18/23 0930     11/13/23 1700  Ampicillin -Sulbactam (UNASYN ) 3 g in sodium chloride  0.9 % 100 mL IVPB        3 g 200 mL/hr over 30 Minutes Intravenous Every 6 hours 11/13/23 1555 11/27/23 1659   11/13/23 0600  cefoTEtan  (CEFOTAN ) 2 g in sodium chloride  0.9 % 100 mL IVPB        2 g 200 mL/hr over 30 Minutes  Intravenous On call to O.R. 11/13/23 0043 11/13/23 0954       Assessment/Plan: EC fistula controlled by drains, responding to current rx, wbc increasing Continue antibiotics Priomising that ostomy starting working May repeat CT later this week.     Evelia Hipp, MD, [facility]  11/23/2023

## 2023-11-23 NOTE — TOC Progression Note (Signed)
 Transition of Care [facility]) - Progression Note    Patient Details  Name: Kristin Miller MRN: [id number] Date of Birth: Jan 02, 1936  [facility] CM/SW Contact  Odilia Bennett, LCSW Phone Number: 11/23/2023, 12:25 PM  Clinical Narrative:  CSW met with patient. Reviewed SNF recommendation. She would like to discuss with her husband. Discussed how insurance covers rehab placement.  Expected Discharge Plan and Services                                               Social Determinants of Health (SDOH) Interventions SDOH Screenings   Food Insecurity: No Food Insecurity (11/14/2023)  Housing: Low Risk  (11/14/2023)  Transportation Needs: No Transportation Needs (11/14/2023)  Utilities: Not At Risk (11/14/2023)  Alcohol Screen: Low Risk  (09/25/2021)  Depression (PHQ2-9): Low Risk  (08/25/2023)  Financial Resource Strain: Low Risk  (01/09/2023)   Received from [facility]  Physical Activity: Inactive (09/25/2021)  Social Connections: Moderately Integrated (11/14/2023)  Stress: No Stress Concern Present (09/25/2021)  Tobacco Use: Low Risk  (11/13/2023)    Readmission Risk Interventions    11/20/2023    8:57 AM  Readmission Risk Prevention Plan  Transportation Screening Complete  PCP or Specialist Appt within 5-7 Days Complete  Medication Review (RN CM) Complete

## 2023-11-23 NOTE — Progress Notes (Signed)
 Progress Note   Patient: Kristin Miller ZOX:[id number] DOB: Mar 08, 1936 DOA: 11/13/2023     10 DOS: the patient was seen and examined on 11/23/2023   Brief hospital course:  Pt. with PMH of hypertension, colon cancer, adenocarcinoma s/p resection and colostomy, right breast cancer, T-spine fracture, left shoulder BCC, admitted on 11/13/2023, with complaint of leaking of ostomy site patient was admitted by general surgery for ostomy revision, was found to have dyspnea and hypoxia.  So [facility] was consulted for further management.    Assessment and Plan:   Acute hypoxic respiratory failure - Initially requiring 2 L nasal cannula.  Likely multifactorial etiology given underlying COPD as well as mild exacerbation of HFpEF.  Showing mild improvement this morning.  Will attempt to wean to room air today.  Wean O2 as tolerated.   Acute exacerbation of HFpEF - Likely secondary to aggressive volume resuscitation.  Chest x-ray personally reviewed noting congestion with mild pleural effusion.  Mild lower extremity edema.  Responded well to IV Lasix , diuresing greater than 4 L so far.  Diuresed nearly 4 L yesterday.  Will attempt to wean supplemental O2 as above.  Will recheck BMP and magnesium  in AM.   COPD exacerbation - Noted wheezing.  Likely contributing to hypoxia.  Showing marked improvement with nebulizer therapy.  Supplemental O2 as above.   Acute blood loss anemia - Likely secondary to surgical intervention and marrow stenting with hemodilution.  No sanguinous drainage from drains.  No active bleeding appreciated.  Hemoglobin showing mild improvement from yesterday.  Will recheck hemoglobin in AM.  Transfuse if less than 7.   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  IR placed BL pigtail drains 5/2.   Currently minimal output from new drains.  Previous drain showing copious biliary output.  Fistula still having output as well.  Pain appears well-controlled.  Continues NPO.  TPN on board.  PT/OT following closely.  Leukocytosis slightly worse this morning.   Adenocarcinoma of the colon - S/p robotic assisted laparoscopic partial colectomy 10/27/23.  Readmitted 4/25 for ostomy revision secondary to necrosis of end colostomy.   Subjective: Patient feeling slightly improved this morning.  Substantial urine output yesterday in response to IV Lasix .  Feeling less short of breath.  Denies any worsening cough.  Abdominal pain well-controlled as long as she does not move.  Worked well with physical therapy yesterday.  Denies fever, nausea, vomiting.  Physical Exam: Vitals:   11/23/23 0252 11/23/23 0255 11/23/23 0743 11/23/23 0755  BP:  (!) 129/57 (!) 117/52   Pulse:  89 84   Resp:  18 18   Temp:  98.9 F (37.2 C) 98.4 F (36.9 C)   TempSrc:  Oral Oral   SpO2:  96% 97% 97%  Weight: 59.5 kg     Height:        GENERAL:  Alert, pleasant, no acute distress  HEENT:  EOMI CARDIOVASCULAR:  RRR, no murmurs appreciated RESPIRATORY:  Clear to auscultation, no wheezing, rales, or rhonchi GASTROINTESTINAL:  Soft, diffusely minimally tender, stoma present, bilateral drains present, anterior surgical incisions, fistula present with mild drainage EXTREMITIES:  No LE edema bilaterally NEURO:  No new focal deficits appreciated SKIN:  No rashes noted PSYCH:  Appropriate mood and affect    Data Reviewed:  No new imaging to review  Labs: CBC:  Recent Labs  Lab 11/20/23 0501 11/21/23 0704 11/22/23 0411 11/23/23 0527  WBC 17.5* 16.4* 21.1* 23.8*  NEUTROABS  --  11.0* 15.4* 18.5*  HGB 8.5* 7.2* 7.8* 8.3*  HCT 26.0* 22.3* 24.2* 25.6*  MCV 85.5 85.1 84.6 84.2  PLT 546* 451* 458* 496*   Basic Metabolic Panel: Recent Labs  Lab 11/18/23 0422 11/19/23 0516 11/20/23 0501 11/20/23 0706  11/21/23 0704 11/23/23 0527  NA 141 142 138  --  138 136  K 3.5 4.0 4.1  --  4.2 4.6  CL 110 109 104  --  102 98  CO2 24 22 23   --  26 26  GLUCOSE 153* 122* 134*  --  150* 188*  BUN 15 15 13   --  15 16  CREATININE 0.61 0.65 0.62  --  0.54 0.52  CALCIUM 7.0* 7.3* 7.9*  --  7.9* 8.5*  MG 2.4 2.1  --  1.9 1.9 2.0  PHOS 2.2* 2.6  --  2.9 3.4 4.3   Liver Function Tests: Recent Labs  Lab 11/18/23 0422 11/19/23 0516 11/23/23 0527  AST 16 19 15   ALT 9 9 8   ALKPHOS 52 63 125  BILITOT 0.3 0.3 0.4  PROT 4.9* 5.3* 6.1*  ALBUMIN  1.7* 1.7* 2.0*   CBG: Recent Labs  Lab 11/22/23 1143 11/22/23 2332 11/23/23 0515 11/23/23 0732 11/23/23 1202  GLUCAP 159* 157* 181* 152* 178*    Scheduled Meds:  acetaminophen   1,000 mg Oral Q6H   alteplase   2 mg Intracatheter Once   amLODipine   5 mg Oral Daily   arformoterol   15 mcg Nebulization BID   budesonide  (PULMICORT ) nebulizer solution  0.25 mg Nebulization BID   enoxaparin  (LOVENOX ) injection  40 mg Subcutaneous Q24H   insulin  aspart  0-9 Units Subcutaneous Q6H   latanoprost   1 drop Both Eyes QHS   loperamide   4 mg Oral Q6H   octreotide   200 mcg Subcutaneous Q8H   pantoprazole  (PROTONIX ) IV  40 mg Intravenous Q12H   sodium chloride  flush  5 mL Intracatheter Q8H   Continuous Infusions:  ampicillin -sulbactam (UNASYN ) IV 3 g (11/23/23 0606)   fluconazole  (DIFLUCAN ) IV 200 mg (11/23/23 0924)   iron  sucrose 200 mg (11/23/23 1201)   TPN ADULT (ION) 65 mL/hr at 11/22/23 1806   TPN ADULT (ION)     PRN Meds:.diphenhydrAMINE  **OR** diphenhydrAMINE , hydrALAZINE , ipratropium-albuterol , methocarbamol  **OR** methocarbamol  (ROBAXIN ) injection, morphine  injection, ondansetron  (ZOFRAN ) IV, mouth rinse, oxyCODONE , prochlorperazine  **OR** prochlorperazine   Family Communication: Husband at bedside  Disposition: Status is: Inpatient Remains inpatient appropriate because: Acute respiratory failure and postop     Time spent: 33  minutes  Author: Jodeane Mulligan, DO 11/23/2023 12:12 PM  For on call review [email or web address].

## 2023-11-23 NOTE — Consult Note (Signed)
 PHARMACY - TOTAL PARENTERAL NUTRITION CONSULT NOTE   Indication: Prolonged ileus  Patient Measurements: Height: 5' (152.4 cm) Weight: 59.5 kg (131 lb 2.8 oz) IBW/kg (Calculated) : 45.5 TPN AdjBW (KG): 51.8 Body mass index is 25.62 kg/m. Usual Weight: 64.9  Assessment:  NE is a 88 yo female who presented with colostomy issues w separation of mucocutaneous junction and distal necrosis post parietal colectomy due to adenocarcinoma of colon. Previously colectomy was 4/8. They underwent revision of colostomy on 4/25. Pharmacy has been consulted to manage this patient's TPN.   Glucose / Insulin : BG 152 - 188 8 units of novolog  in the last 24 hours,  sSSI q6h.  Electrolytes: no labs 5/4 5/3: Corrected Ca: 9.7   (Ca 7.9  alb 1.7) Renal: Scr < 1 Hepatic: WNL Intake / Output; MIVF: No MIVF at this time  Intake/Output Summary (Last 24 hours) at 11/23/2023 0700 Last data filed at 11/23/2023 0400 Gross per 24 hour  Intake 641.66 ml  Output 4330 ml  Net -3688.34 ml    GI Imaging: 4/23: CT Abdomen Pelvis 1. Surgical changes from a left colectomy and left lower colostomy. The distal colostomy is abnormal. Beginning approximately 10 cm from the ostomy site the colon is very amorphous and is filled with fluid. There are abnormal gas collections around the bowel and there is a large collection of gas in the subcutaneous tissues. Findings very worrisome for necrosis of the distal 10 cm of colon. 4/30 CT abd/pel: --Multiple fluid collections are noted throughout the peritoneal space, concerning for possible abscesses.  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, complicated by pertinent comorbid conditions   --2 new drains placed yesterday 5/2 by interventional radiology in 2 new right-sided fluid collections   Central  access: 11/17/23 TPN start date: 11/17/23  Nutritional Goals: Goal TPN rate is 65 mL/hr (provides 87 g of protein and 1666 kcals per day)  RD Assessment: Estimated Needs Total Energy Estimated Needs: 1600-1800kcal/day Total Protein Estimated Needs: 80-90g/day Total Fluid Estimated Needs: 1.4-1.6L/day  Current Nutrition:  NPO  Plan:  ---Continue custom TPN at goal at 65 mL/hr  --- Electrolytes in TPN: Na 65mEq/L, K 70mEq/L, Ca 78mEq/L, Mg 5 mEq/L, and Phos 15. Cl:Ac 1:1 ---Add standard MVI and trace elements to TPN ---Continue Sensitive q6h SSI and adjust as needed  ---Add chromium 10 mcg per dietary recommendations  ---Monitor TPN labs on Mon/Thurs  Adalberto Acton, PharmD 11/23/2023,7:00 AM

## 2023-11-23 NOTE — Progress Notes (Signed)
 Patient ID: Kristin Miller, female   DOB: Nov 06, 1935, 88 y.o.   MRN: [id number]    Referring Physician(s): Pabon, Marcial Setting, MD    Supervising Physician: Fernando Hoyer  Patient Status:  [facility] - In-pt  Chief Complaint:  Abdominal abscesses; s/p x2 abdominal abscess drain placements on 11/20/23  Subjective:  Pt is day 3 post x2 abdominal drains placed by IR for abdominal abscesses. Pt reports feeling the same as before these were placed. Not having any new abd pain. Still NPO and requiring TPN.  Allergies: Combigan  [brimonidine  tartrate-timolol ], Lisinopril , and Other  Medications: Prior to Admission medications   Medication Sig Start Date End Date Taking? Authorizing Provider  acetaminophen  (TYLENOL ) 500 MG tablet Take 500 mg by mouth every 6 (six) hours as needed for pain.   Yes [provider]  alendronate  (FOSAMAX ) 70 MG tablet Take 1 tablet (70 mg total) by mouth every 7 (seven) days for 48 doses. Take with a full glass of water  on an empty stomach. 07/21/23 06/15/24 Yes Ziglar, Susan K, MD  amLODipine  (NORVASC ) 5 MG tablet Take 1 tablet (5 mg total) by mouth daily. 07/28/23  Yes Ziglar, Susan K, MD  docusate sodium (COLACE) 100 MG capsule Take 100 mg by mouth daily as needed for mild constipation.   Yes [provider]  latanoprost  (XALATAN ) 0.005 % ophthalmic solution 1 drop at bedtime. Eye doctor both eyes 12/18/20  Yes [provider]  loperamide  (IMODIUM ) 2 MG capsule Take 1 capsule (2 mg total) by mouth as needed for diarrhea or loose stools. May take 1-2 capsules every 8 hours 11/09/23  Yes Pabon, Diego F, MD  losartan -hydrochlorothiazide  (HYZAAR) 100-12.5 MG tablet Take 1 tablet by mouth daily. 07/21/23  Yes Ziglar, Susan K, MD  metroNIDAZOLE  (FLAGYL ) 500 MG tablet Take 2 tabs (1000 mg total) at 8 am, again at 2 pm, and again at 8 pm the day prior to surgery. 11/11/23  Yes Pabon, Diego F, MD  Multiple Minerals-Vitamins (CAL MAG ZINC +D3 PO) Take 1  tablet by mouth daily.   Yes [provider]  neomycin  (MYCIFRADIN ) 500 MG tablet Take 2 tabs (1000 mg total) at 8 am, again at 2 pm, and again at 8 pm the day prior to surgery. 11/11/23  Yes Pabon, Diego F, MD  oxyCODONE  (OXY IR/ROXICODONE ) 5 MG immediate release tablet Take 1 tablet (5 mg total) by mouth every 4 (four) hours as needed for severe pain (pain score 7-10). 11/09/23  Yes Pabon, Diego F, MD  potassium chloride  SA (KLOR-CON  M) 20 MEQ tablet Take 2 tablets (40 mEq total) by mouth daily. 11/09/23 12/09/23 Yes Pabon, Diego F, MD     Vital Signs: BP (!) 117/52 (BP Location: Left Leg)   Pulse 84   Temp 98.4 F (36.9 C) (Oral)   Resp 18   Ht 5' (1.524 m)   Wt 131 lb 2.8 oz (59.5 kg)   SpO2 97%   BMI 25.62 kg/m   Physical Exam Vitals and nursing note reviewed.  Constitutional:      Comments: Appears generally weak, but otherwise pleasant and in no acute distress  HENT:     Mouth/Throat:     Mouth: Mucous membranes are moist.     Pharynx: Oropharynx is clear.  Cardiovascular:     Rate and Rhythm: Normal rate and regular rhythm.  Pulmonary:     Effort: Pulmonary effort is normal.     Breath sounds: Normal breath sounds.  Abdominal:  Palpations: Abdomen is soft.     Comments: X4 total abd drains  - new IR drains appear well - LUQ drain with about 5-10cc of clear yellow output at bedside, left lower drain near midline with trace output at bedside - LUQ drain with 15ml over last 24h, no documented I/O from drain at left lower midline - no ttp surrounding these drains and otherwise appear well. They F/A easily at bedside - x2 former drains placed by gen surg appear well. These have more output with and 85ml respectively.  Musculoskeletal:     Right lower leg: No edema.     Left lower leg: No edema.  Skin:    General: Skin is warm and dry.  Neurological:     Mental Status: She is alert and oriented to person, place, and time. Mental status is at baseline.      Imaging: US  Venous Img Lower Unilateral Left (DVT) Result Date: 11/21/2023 CLINICAL DATA:  Edema EXAM: LEFT LOWER EXTREMITY VENOUS DOPPLER ULTRASOUND TECHNIQUE: Gray-scale sonography with compression, as well as color and duplex ultrasound, were performed to evaluate the deep venous system(s) from the level of the common femoral vein through the popliteal and proximal calf veins. COMPARISON:  None available FINDINGS: VENOUS Normal compressibility of the common femoral, superficial femoral, and popliteal veins, as well as the visualized calf veins. Visualized portions of profunda femoral vein and great saphenous vein unremarkable. No filling defects to suggest DVT on grayscale or color Doppler imaging. Doppler waveforms show normal direction of venous flow, normal respiratory plasticity and response to augmentation. Limited views of the contralateral common femoral vein are unremarkable. OTHER None. Limitations: none IMPRESSION: No DVT of the left lower extremity. Electronically Signed   By: Elester Grim M.D.   On: 11/21/2023 07:00   DG Chest Port 1 View Result Date: 11/20/2023 CLINICAL DATA:  Shortness of breath. EXAM: PORTABLE CHEST 1 VIEW COMPARISON:  May 26, 2016. FINDINGS: Mild cardiomegaly is noted. Left-sided PICC line is noted with distal tip in expected position of the SVC. Hypoinflation of the lungs is noted. Minimal left basilar atelectasis or infiltrate is noted with small pleural effusion. No definite pneumothorax. Bony thorax is unremarkable. IMPRESSION: Minimal left basilar atelectasis or infiltrate is noted with small pleural effusion. Electronically Signed   By: Rosalene Colon M.D.   On: 11/20/2023 17:24    Labs:  CBC: Recent Labs    11/20/23 0501 11/21/23 0704 11/22/23 0411 11/23/23 0527  WBC 17.5* 16.4* 21.1* 23.8*  HGB 8.5* 7.2* 7.8* 8.3*  HCT 26.0* 22.3* 24.2* 25.6*  PLT 546* 451* 458* 496*    COAGS: No results for input(s): "INR", "APTT" in the last 8760  hours.  BMP: Recent Labs    11/19/23 0516 11/20/23 0501 11/21/23 0704 11/23/23 0527  NA 142 138 138 136  K 4.0 4.1 4.2 4.6  CL 109 104 102 98  CO2 22 23 26 26   GLUCOSE 122* 134* 150* 188*  BUN 15 13 15 16   CALCIUM 7.3* 7.9* 7.9* 8.5*  CREATININE 0.65 0.62 0.54 0.52  GFRNONAA >60 >60 >60 >60    LIVER FUNCTION TESTS: Recent Labs    11/16/23 0608 11/18/23 0422 11/19/23 0516 11/23/23 0527  BILITOT 0.7 0.3 0.3 0.4  AST 13* 16 19 15   ALT 9 9 9 8   ALKPHOS 44 52 63 125  PROT 4.7* 4.9* 5.3* 6.1*  ALBUMIN  1.7* 1.7* 1.7* 2.0*    Assessment and Plan:  Day 3 post x2 new  abdominal drain placements by IR - pt feeling about the same as prior to placement - drains appear well - LUQ drain with about 5-10cc of clear yellow output at bedside, left lower drain near midline with trace output at bedside - LUQ drain with 15ml over last 24h, no documented I/O from drain at left lower midline - no ttp surrounding these drains and otherwise appear well. They F/A easily at bedside - x2 former drains placed by gen surg appear well. These have more output with and 85ml respectively. - IR will continue to follow for drain care as needed while pt is in the hospital  Current examination: Flushes/aspirates easily.  Insertion site unremarkable. Suture and stat lock in place. Dressed appropriately.   Plan: Continue TID flushes with 5 cc NS. Record output Q shift. Dressing changes QD or PRN if soiled.  Call IR APP or on call IR MD if difficulty flushing or sudden change in drain output.  Repeat imaging/possible drain injection once output < 10 mL/QD (excluding flush material). Consideration for drain removal if output is < 10 mL/QD (excluding flush material), pending discussion with the providing surgical service.  Discharge planning: Please contact IR APP or on call IR MD prior to patient d/c to ensure appropriate follow up plans are in place. Typically patient will follow up with IR  clinic 10-14 days post d/c for repeat imaging/possible drain injection. IR scheduler will contact patient with date/time of appointment. Patient will need to flush drain QD with 5 cc NS, record output QD, dressing changes every 2-3 days or earlier if soiled.     Electronically Signed: Nicolasa Barrett, PA-C 11/23/2023, 1:47 PM   I spent a total of 15 Minutes at the the patient's bedside AND on the patient's hospital floor or unit, greater than 50% of which was counseling/coordinating care for abdominal abscess drains follow up.

## 2023-11-24 DIAGNOSIS — I5033 Acute on chronic diastolic (congestive) heart failure: Secondary | ICD-10-CM | POA: Diagnosis not present

## 2023-11-24 DIAGNOSIS — D649 Anemia, unspecified: Secondary | ICD-10-CM | POA: Diagnosis not present

## 2023-11-24 DIAGNOSIS — J9601 Acute respiratory failure with hypoxia: Secondary | ICD-10-CM | POA: Insufficient documentation

## 2023-11-24 DIAGNOSIS — K9400 Colostomy complication, unspecified: Secondary | ICD-10-CM | POA: Diagnosis not present

## 2023-11-24 LAB — CBC WITH DIFFERENTIAL/PLATELET
Abs Immature Granulocytes: 0.94 10*3/uL — ABNORMAL HIGH (ref 0.00–0.07)
Basophils Absolute: 0.1 10*3/uL (ref 0.0–0.1)
Basophils Relative: 1 %
Eosinophils Absolute: 0.8 10*3/uL — ABNORMAL HIGH (ref 0.0–0.5)
Eosinophils Relative: 4 %
HCT: 24.4 % — ABNORMAL LOW (ref 36.0–46.0)
Hemoglobin: 7.9 g/dL — ABNORMAL LOW (ref 12.0–15.0)
Immature Granulocytes: 4 %
Lymphocytes Relative: 5 %
Lymphs Abs: 1 10*3/uL (ref 0.7–4.0)
MCH: 27.7 pg (ref 26.0–34.0)
MCHC: 32.4 g/dL (ref 30.0–36.0)
MCV: 85.6 fL (ref 80.0–100.0)
Monocytes Absolute: 2.3 10*3/uL — ABNORMAL HIGH (ref 0.1–1.0)
Monocytes Relative: 11 %
Neutro Abs: 16.2 10*3/uL — ABNORMAL HIGH (ref 1.7–7.7)
Neutrophils Relative %: 75 %
Platelets: 516 10*3/uL — ABNORMAL HIGH (ref 150–400)
RBC: 2.85 MIL/uL — ABNORMAL LOW (ref 3.87–5.11)
RDW: 15.9 % — ABNORMAL HIGH (ref 11.5–15.5)
WBC: 21.3 10*3/uL — ABNORMAL HIGH (ref 4.0–10.5)
nRBC: 0 % (ref 0.0–0.2)

## 2023-11-24 LAB — GLUCOSE, CAPILLARY
Glucose-Capillary: 144 mg/dL — ABNORMAL HIGH (ref 70–99)
Glucose-Capillary: 161 mg/dL — ABNORMAL HIGH (ref 70–99)
Glucose-Capillary: 131 mg/dL — ABNORMAL HIGH (ref 70–99)
Glucose-Capillary: 156 mg/dL — ABNORMAL HIGH (ref 70–99)

## 2023-11-24 MED ORDER — IRON SUCROSE 200 MG IVPB - SIMPLE MED
200.0000 mg | Status: DC
  Administered 2023-11-24 – 2023-12-15 (×4): 200 mg via INTRAVENOUS
  Filled 2023-11-24 (×4): qty 200

## 2023-11-24 MED ORDER — TRACE MINERALS CU-MN-SE-ZN 300-55-60-3000 MCG/ML IV SOLN: INTRAVENOUS | Status: DC

## 2023-11-24 MED ORDER — POTASSIUM CHLORIDE 2 MEQ/ML IV SOLN
INTRAVENOUS | Status: AC
  Filled 2023-11-24: qty 582.4

## 2023-11-24 MED ORDER — FUROSEMIDE 10 MG/ML IJ SOLN
40.0000 mg | Freq: Once | INTRAMUSCULAR | Status: AC
  Administered 2023-11-24: 40 mg via INTRAVENOUS
  Filled 2023-11-24: qty 4

## 2023-11-24 NOTE — Consult Note (Addendum)
 PHARMACY - TOTAL PARENTERAL NUTRITION CONSULT NOTE   Indication: Prolonged ileus  Patient Measurements: Height: 5' (152.4 cm) Weight: 60.8 kg (134 lb 0.6 oz) IBW/kg (Calculated) : 45.5 TPN AdjBW (KG): 51.8 Body mass index is 26.18 kg/m. Usual Weight: 64.9  Assessment:  NE is a 88 yo female who presented with colostomy issues w separation of mucocutaneous junction and distal necrosis post parietal colectomy due to adenocarcinoma of colon. Previously colectomy was 4/8. They underwent revision of colostomy on 4/25. Pharmacy has been consulted to manage this patient's TPN.   Glucose / Insulin : BG 144 - 187 4 units of novolog  in the last 24 hours,  sSSI q6h.  Electrolytes: no labs 5/4 5/3: Corrected Ca: 9.7   (Ca 7.9  alb 1.7) Renal: Scr < 1 Hepatic: WNL Intake / Output; MIVF: No MIVF at this time  Intake/Output Summary (Last 24 hours) at 11/24/2023 0705 Last data filed at 11/24/2023 9629 Gross per 24 hour  Intake 2043.68 ml  Output 1765 ml  Net 278.68 ml    GI Imaging: 4/23: CT Abdomen Pelvis 1. Surgical changes from a left colectomy and left lower colostomy. The distal colostomy is abnormal. Beginning approximately 10 cm from the ostomy site the colon is very amorphous and is filled with fluid. There are abnormal gas collections around the bowel and there is a large collection of gas in the subcutaneous tissues. Findings very worrisome for necrosis of the distal 10 cm of colon. 4/30 CT abd/pel: --Multiple fluid collections are noted throughout the peritoneal space, concerning for possible abscesses.  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, complicated by pertinent comorbid conditions   --2 new drains placed yesterday 5/2 by interventional radiology in 2 new right-sided fluid collections   Central  access: 11/17/23 TPN start date: 11/17/23  Nutritional Goals: Goal TPN rate is 65 mL/hr (provides 87 g of protein and 1666 kcals per day)  RD Assessment: Estimated Needs Total Energy Estimated Needs: 1600-1800kcal/day Total Protein Estimated Needs: 80-90g/day Total Fluid Estimated Needs: 1.4-1.6L/day  Current Nutrition:  NPO  Plan:  ---Continue custom TPN at goal of 65 mL/hr  --- Electrolytes in TPN: Na 60mEq/L, K 26mEq/L, Ca 63mEq/L, Mg 5 mEq/L, and Phos 15. Cl:Ac 1:1 ---Add standard MVI and trace elements to TPN ---Continue Sensitive q6h SSI and adjust as needed  ---chromium 10 mcg/day per dietary recommendations:  unable to provide at this time due to national backorder status ---Monitor TPN labs on Mon/Thurs  Adalberto Acton, PharmD 11/24/2023,7:05 AM

## 2023-11-24 NOTE — Progress Notes (Signed)
 CC: fistula Subjective: No new complaints Drains total 365, surgical drain are the active ones. Wbc up but afebrile  Objective: Vital signs in last 24 hours: Temp:  [98.2 F (36.8 C)-98.5 F (36.9 C)] 98.2 F (36.8 C) (05/06 0731) Pulse Rate:  [87-91] 88 (05/06 0731) Resp:  [16-17] 17 (05/06 0731) BP: (123-140)/(59) 129/59 (05/06 0731) SpO2:  [90 %-97 %] 96 % (05/06 0731) FiO2 (%):  [21 %] 21 % (05/05 2035) Weight:  [60.8 kg] 60.8 kg (05/06 0500) Last BM Date : 11/23/23  Intake/Output from previous day: 05/05 0701 - 05/06 0700 In: 2043.7 [I.V.:623.7; IV Piggyback:1400] Out: 1765 [Urine:1400; Drains:365] Intake/Output this shift: No intake/output data recorded.  Physical exam: Debilitated Abd: soft, bilious drainage, now Left side seems to be more active. Drain holding suction. No peritonitis. Surgical drains are the ones controlling fistula, IR drain not doing much. Ostomy with good liquid output  Lab Results: CBC  Recent Labs    11/23/23 0527 11/24/23 0509  WBC 23.8* 21.3*  HGB 8.3* 7.9*  HCT 25.6* 24.4*  PLT 496* 516*   BMET Recent Labs    11/23/23 0527  NA 136  K 4.6  CL 98  CO2 26  GLUCOSE 188*  BUN 16  CREATININE 0.52  CALCIUM 8.5*   PT/INR No results for input(s): "LABPROT", "INR" in the last 72 hours. ABG No results for input(s): "PHART", "HCO3" in the last 72 hours.  Invalid input(s): "PCO2", "PO2"  Studies/Results: No results found.  Anti-infectives: Anti-infectives (From admission, onward)    Start     Dose/Rate Route Frequency Ordered Stop   11/18/23 1015  fluconazole  (DIFLUCAN ) IVPB 200 mg        200 mg 100 mL/hr over 60 Minutes Intravenous Every 24 hours 11/18/23 0930     11/13/23 1700  Ampicillin -Sulbactam (UNASYN ) 3 g in sodium chloride  0.9 % 100 mL IVPB        3 g 200 mL/hr over 30 Minutes Intravenous Every 6 hours 11/13/23 1555 11/27/23 1659   11/13/23 0600  cefoTEtan  (CEFOTAN ) 2 g in sodium chloride  0.9 % 100 mL IVPB         2 g 200 mL/hr over 30 Minutes Intravenous On call to O.R. 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

## 2023-11-24 NOTE — Progress Notes (Signed)
 Occupational Therapy Treatment Patient Details Name: Kristin Miller MRN: [id number] DOB: Jun 17, 1936 Today's Date: 11/24/2023   History of present illness SAPHIA LAFORTE presented to [facility] on 4/25 for planned surgery, with the diagnosis of Colostomy complication; now s/p revision. Other dx include back pain, glaucoma, HTN, COPD.   OT comments  Pt seen for OT treatment on this date. Upon arrival to room pt elevated HOB with husband at bedside, agreeable to tx. Pt requires verbal cues for sequencing/hand placement to get to EOB, physical assistance for drain management. Pt tolerated seating on EOB ~69mins with good static sitting balance. Limited dynamic sitting due to abdominal discomfort. Pt STS from EOB with CGA + RW x2. Stood at [employer] and completed static marching for ~30 seconds. Stood for ~4 mins over the course of standing bouts, pt required a seated rest break in between standing bouts. Pt on 2L oxygen Sp02 level 96% 2L via [place], HR 97bpm. On RA during transfers destat 83%, HR 108bpm. Pt edu on pursed lip breathing technique with fair carryover. Pt returned to supine with oxygen on 2L via [place], Spo2 levels 97%.  All needs within reach. Pt making good progress toward goals, will continue to follow POC. Discharge recommendation remains appropriate.        If plan is discharge home, recommend the following:  A lot of help with walking and/or transfers;A lot of help with bathing/dressing/bathroom;Assistance with cooking/housework;Direct supervision/assist for medications management;Direct supervision/assist for financial management;Assist for transportation;Help with stairs or ramp for entrance   Equipment Recommendations  Other (comment)    Recommendations for Other Services      Precautions / Restrictions Precautions Precautions: Fall;Other (comment) Recall of Precautions/Restrictions: Impaired Restrictions Weight Bearing Restrictions Per Provider Order: No       Mobility Bed  Mobility Overal bed mobility: Needs Assistance Bed Mobility: Supine to Sit, Sit to Supine     Supine to sit: Contact guard, Used rails, HOB elevated (Cues for sequencing) Sit to supine: Min assist (LE assistance)   General bed mobility comments: Supervision EOB sitting total ~15 mins    Transfers Overall transfer level: Needs assistance Equipment used: Rolling walker (2 wheels) Transfers: Sit to/from Stand Sit to Stand: Contact guard assist           General transfer comment: x2 standing bouts with RW, remained standing ~3 mins on inital standing, <1 min on second standing. Lateral scooting up the [facility], no physical assist, verbal cues for sequencing.     Balance Overall balance assessment: Needs assistance Sitting-balance support: Feet supported Sitting balance-Leahy Scale: Good Sitting balance - Comments: Good reaching within BOS   Standing balance support: Bilateral upper extremity supported, Reliant on assistive device for balance Standing balance-Leahy Scale: Fair Standing balance comment: Fearfull of fatigue in standing                           ADL either performed or assessed with clinical judgement   ADL Overall ADL's : Needs assistance/impaired Eating/Feeding: Sitting;Independent   Grooming: Wash/dry face;Sitting;Set up                               Functional mobility during ADLs: Contact guard assist;Rolling walker (2 wheels) General ADL Comments: Seated grooming tasks with set up, unable to tolerate prolonged standing tasks    Communication Communication Communication: No apparent difficulties   Cognition Arousal: Alert Behavior During Therapy: [facility] for  tasks assessed/performed Cognition: No apparent impairments             OT - Cognition Comments: A/O x4 throughout session                 Following commands: Intact        Cueing   Cueing Techniques: Verbal cues  Exercises Exercises: Other exercises Other  Exercises Other Exercises: Edu: Benefits of mobility, verbal cues required for sequencing during transfers, safe ADL completion.    Shoulder Instructions       General Comments All drains/lines in tact pre/post session    Pertinent Vitals/ Pain       Pain Assessment Pain Assessment: Faces Faces Pain Scale: Hurts a little bit Pain Location: abdomen Pain Descriptors / Indicators: Aching Pain Intervention(s): Limited activity within patient's tolerance, Monitored during session  Home Living                                          Prior Functioning/Environment              Frequency  Min 3X/week        Progress Toward Goals  OT Goals(current goals can now be found in the care plan section)  Progress towards OT goals: Progressing toward goals  Acute Rehab OT Goals Patient Stated Goal: Get better to get back home OT Goal Formulation: With patient Time For Goal Achievement: 12/05/23 Potential to Achieve Goals: Good ADL Goals Pt Will Perform Grooming: with modified independence;standing Pt Will Perform Lower Body Bathing: with modified independence;sit to/from stand Pt Will Perform Lower Body Dressing: with modified independence;sit to/from stand Pt Will Transfer to Toilet: with modified independence;ambulating Pt Will Perform Toileting - Clothing Manipulation and hygiene: with modified independence;sit to/from stand  Plan      Co-evaluation                 AM-PAC OT "6 Clicks" Daily Activity     Outcome Measure   Help from another person eating meals?: None Help from another person taking care of personal grooming?: A Little Help from another person toileting, which includes using toliet, bedpan, or urinal?: A Lot Help from another person bathing (including washing, rinsing, drying)?: A Lot Help from another person to put on and taking off regular upper body clothing?: A Lot Help from another person to put on and taking off regular  lower body clothing?: A Lot 6 Click Score: 15    End of Session Equipment Utilized During Treatment: Rolling walker (2 wheels);Oxygen  OT Visit Diagnosis: Muscle weakness (generalized) (M62.81)   Activity Tolerance Patient tolerated treatment well   Patient Left in bed;with call bell/phone within reach;with bed alarm set;with family/visitor present   Nurse Communication Other (comment);Mobility status (Need for attention to IV monitor alarm)        Time: 8756-4332 OT Time Calculation (min): 29 min  Charges: OT General Charges $OT Visit: 1 Visit OT Treatments $Self Care/Home Management : 8-22 mins $Therapeutic Activity: 8-22 mins  Rosaria Common M.S. OTR/L  11/24/23, 9:59 AM

## 2023-11-24 NOTE — Plan of Care (Signed)
   Problem: Education: Goal: Knowledge of General Education information will improve Description: Including pain rating scale, medication(s)/side effects and non-pharmacologic comfort measures Outcome: Progressing   Problem: Clinical Measurements: Goal: Ability to maintain clinical measurements within normal limits will improve Outcome: Progressing   Problem: Clinical Measurements: Goal: Diagnostic test results will improve Outcome: Progressing   Problem: Clinical Measurements: Goal: Cardiovascular complication will be avoided Outcome: Progressing

## 2023-11-24 NOTE — TOC Progression Note (Signed)
 Transition of Care [facility]) - Progression Note    Patient Details  Name: Kristin Miller MRN: [id number] Date of Birth: [birth year]  Transition of [facility]) CM/SW Contact  Loman Risk, RN Phone Number: 11/24/2023, 12:02 PM  Clinical Narrative:      Met with patient and spouse at bedside Discussed recommendation for SNF  Patient and spouse are going to discuss their options.  Patient still with TPN  TOC to follow up with patient tomorrow regarding disposition   Expected Discharge Plan and Services                                               Social Determinants of Health (SDOH) Interventions SDOH Screenings   Food Insecurity: No Food Insecurity (11/14/2023)  Housing: Low Risk  (11/24/2023)  Transportation Needs: No Transportation Needs (11/14/2023)  Utilities: Not At Risk (11/14/2023)  Alcohol Screen: Low Risk  (09/25/2021)  Depression (PHQ2-9): Low Risk  (08/25/2023)  Financial Resource Strain: Low Risk  (01/09/2023)   Received from [facility], [facility]  Physical Activity: Inactive (09/25/2021)  Social Connections: Moderately Integrated (11/14/2023)  Stress: No Stress Concern Present (09/25/2021)  Tobacco Use: Low Risk  (11/13/2023)    Readmission Risk Interventions    11/20/2023    8:57 AM  Readmission Risk Prevention Plan  Transportation Screening Complete  PCP or Specialist Appt within 5-7 Days Complete  Medication Review (RN CM) Complete

## 2023-11-24 NOTE — Progress Notes (Signed)
 Progress Note   Patient: Kristin Miller WUJ:[id number] DOB: 1935/08/21 DOA: 11/13/2023     11 DOS: the patient was seen and examined on 11/24/2023   Brief hospital course:  Pt. with PMH of hypertension, colon cancer, adenocarcinoma s/p resection and colostomy, right breast cancer, T-spine fracture, left shoulder BCC, admitted on 11/13/2023, with complaint of leaking of ostomy site patient was admitted by general surgery for ostomy revision, was found to have dyspnea and hypoxia.  So TRH was consulted for further management.    Assessment and Plan:   Acute hypoxic respiratory failure - Initially requiring 2 L nasal cannula.  Likely multifactorial etiology given underlying COPD as well as mild exacerbation of HFpEF.  Continues to show improvement.  O2 sat 90% on room air this morning.  Will attempt to wean to room air today.  Wean O2 as tolerated.   Acute exacerbation of HFpEF - Likely secondary to aggressive volume resuscitation.  Chest x-ray personally reviewed noting congestion with mild pleural effusion.  Mild lower extremity edema.  Responded well to IV Lasix , diuresing greater than 4 L so far.  Held IV Lasix  yesterday.  Will give repeat dose of IV Lasix  40 mg x 1 today.  Monitor urine output.  Will attempt to wean supplemental O2 as above.  Will recheck BMP and magnesium  in AM.   COPD exacerbation - Noted wheezing.  Likely contributing to hypoxia.  Showing marked improvement with nebulizer therapy.  Supplemental O2 as above.   Acute blood loss anemia - Likely secondary to surgical intervention and marrow stunting with hemodilution.  No sanguinous drainage from surgical/IR drains.  No active bleeding appreciated.  Hemoglobin continuing to show mild downtrend.  Will recheck hemoglobin in AM.  Transfuse if less than 7.   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  IR placed BL pigtail drains 5/2.  Currently minimal output from new IR drains.  Previous surgical drain showing copious biliary output.  Fistula still having output as well.  Pain appears well-controlled.  Continues NPO.  TPN on board.  PT/OT following closely.  Leukocytosis slightly improved this morning but still elevated.  Per general surgery, likely repeat CT in AM.   Adenocarcinoma of the colon - S/p robotic assisted laparoscopic partial colectomy 10/27/23.  Readmitted 4/25 for ostomy revision secondary to necrosis of end colostomy.   Subjective: Patient feeling well this morning.  Pain well-controlled.  Able to tolerate room air at 98%.  Placed back on 1-2 L for comfort.  Urine output excellent.  Denies any worsening shortness of breath or cough.  Denies fevers, nausea, vomiting.  Physical Exam: Vitals:   11/24/23 0500 11/24/23 0515 11/24/23 0710 11/24/23 0731  BP:  (!) 140/59  (!) 129/59  Pulse:  88  88  Resp:  16  17  Temp:  98.2 F (36.8 C)  98.2 F (36.8 C)  TempSrc:    Oral  SpO2:  96% 97% 96%  Weight: 60.8 kg     Height:        GENERAL:  Alert, pleasant, no acute distress  HEENT:  EOMI, nasal cannula CARDIOVASCULAR:  RRR, no murmurs appreciated RESPIRATORY:  Clear to auscultation, no wheezing, rales, or rhonchi GASTROINTESTINAL:  Soft, diffusely minimally tender, stoma present, bilateral drains present, anterior surgical incisions, fistula present with mild drainage EXTREMITIES:  No LE edema  bilaterally NEURO:  No new focal deficits appreciated SKIN:  No rashes noted PSYCH:  Appropriate mood and affect    Data Reviewed:  No new imaging to review  Labs: CBC: Recent Labs  Lab 11/20/23 0501 11/21/23 0704 11/22/23 0411 11/23/23 0527 11/24/23 0509  WBC 17.5* 16.4* 21.1* 23.8* 21.3*  NEUTROABS  --  11.0* 15.4* 18.5* 16.2*  HGB 8.5* 7.2* 7.8* 8.3* 7.9*  HCT 26.0* 22.3* 24.2* 25.6* 24.4*  MCV  85.5 85.1 84.6 84.2 85.6  PLT 546* 451* 458* 496* 516*   Basic Metabolic Panel: Recent Labs  Lab 11/18/23 0422 11/19/23 0516 11/20/23 0501 11/20/23 0706 11/21/23 0704 11/23/23 0527  NA 141 142 138  --  138 136  K 3.5 4.0 4.1  --  4.2 4.6  CL 110 109 104  --  102 98  CO2 24 22 23   --  26 26  GLUCOSE 153* 122* 134*  --  150* 188*  BUN 15 15 13   --  15 16  CREATININE 0.61 0.65 0.62  --  0.54 0.52  CALCIUM 7.0* 7.3* 7.9*  --  7.9* 8.5*  MG 2.4 2.1  --  1.9 1.9 2.0  PHOS 2.2* 2.6  --  2.9 3.4 4.3   Liver Function Tests: Recent Labs  Lab 11/18/23 0422 11/19/23 0516 11/23/23 0527  AST 16 19 15   ALT 9 9 8   ALKPHOS 52 63 125  BILITOT 0.3 0.3 0.4  PROT 4.9* 5.3* 6.1*  ALBUMIN  1.7* 1.7* 2.0*   CBG: Recent Labs  Lab 11/23/23 0732 11/23/23 1202 11/23/23 1807 11/23/23 2327 11/24/23 0511  GLUCAP 152* 178* 187* 157* 144*    Scheduled Meds:  acetaminophen   1,000 mg Oral Q6H   alteplase   2 mg Intracatheter Once   amLODipine   5 mg Oral Daily   arformoterol   15 mcg Nebulization BID   budesonide  (PULMICORT ) nebulizer solution  0.25 mg Nebulization BID   enoxaparin  (LOVENOX ) injection  40 mg Subcutaneous Q24H   furosemide   40 mg Intravenous Once   insulin  aspart  0-9 Units Subcutaneous Q6H   latanoprost   1 drop Both Eyes QHS   loperamide   4 mg Oral Q6H   octreotide   200 mcg Subcutaneous Q8H   pantoprazole  (PROTONIX ) IV  40 mg Intravenous Q12H   sodium chloride  flush  5 mL Intracatheter Q8H   Continuous Infusions:  ampicillin -sulbactam (UNASYN ) IV 3 g (11/24/23 1125)   fluconazole  (DIFLUCAN ) IV 200 mg (11/23/23 0924)   iron  sucrose     TPN ADULT (ION) 65 mL/hr at 11/24/23 0355   TPN ADULT (ION)     PRN Meds:.diphenhydrAMINE  **OR** diphenhydrAMINE , hydrALAZINE , ipratropium-albuterol , methocarbamol  **OR** methocarbamol  (ROBAXIN ) injection, morphine  injection, ondansetron  (ZOFRAN ) IV, mouth rinse, oxyCODONE , prochlorperazine  **OR** prochlorperazine   Family Communication:  Husband at bedside  Disposition: Status is: Inpatient Remains inpatient appropriate because: Acute hypoxic respiratory failure, abdominal wall abscess     Time spent: 35 minutes  Author: Jodeane Mulligan, DO 11/24/2023 11:45 AM  For on call review [email or web address].

## 2023-11-25 ENCOUNTER — Inpatient Hospital Stay

## 2023-11-25 DIAGNOSIS — C189 Malignant neoplasm of colon, unspecified: Secondary | ICD-10-CM | POA: Diagnosis not present

## 2023-11-25 LAB — AEROBIC/ANAEROBIC CULTURE W GRAM STAIN (SURGICAL/DEEP WOUND)
Gram Stain: NONE SEEN
Gram Stain: NONE SEEN
Special Requests: 1

## 2023-11-25 LAB — GLUCOSE, CAPILLARY
Glucose-Capillary: 157 mg/dL — ABNORMAL HIGH (ref 70–99)
Glucose-Capillary: 160 mg/dL — ABNORMAL HIGH (ref 70–99)
Glucose-Capillary: 163 mg/dL — ABNORMAL HIGH (ref 70–99)
Glucose-Capillary: 164 mg/dL — ABNORMAL HIGH (ref 70–99)

## 2023-11-25 LAB — CBC WITH DIFFERENTIAL/PLATELET
Abs Immature Granulocytes: 1.11 10*3/uL — ABNORMAL HIGH (ref 0.00–0.07)
Basophils Absolute: 0.2 10*3/uL — ABNORMAL HIGH (ref 0.0–0.1)
Basophils Relative: 1 %
Eosinophils Absolute: 0.8 10*3/uL — ABNORMAL HIGH (ref 0.0–0.5)
Eosinophils Relative: 3 %
HCT: 23.1 % — ABNORMAL LOW (ref 36.0–46.0)
Hemoglobin: 7.8 g/dL — ABNORMAL LOW (ref 12.0–15.0)
Immature Granulocytes: 5 %
Lymphocytes Relative: 4 %
Lymphs Abs: 1 10*3/uL (ref 0.7–4.0)
MCH: 28.3 pg (ref 26.0–34.0)
MCHC: 33.8 g/dL (ref 30.0–36.0)
MCV: 83.7 fL (ref 80.0–100.0)
Monocytes Absolute: 2.4 10*3/uL — ABNORMAL HIGH (ref 0.1–1.0)
Monocytes Relative: 10 %
Neutro Abs: 17.5 10*3/uL — ABNORMAL HIGH (ref 1.7–7.7)
Neutrophils Relative %: 77 %
Platelets: 653 10*3/uL — ABNORMAL HIGH (ref 150–400)
RBC: 2.76 MIL/uL — ABNORMAL LOW (ref 3.87–5.11)
RDW: 15.8 % — ABNORMAL HIGH (ref 11.5–15.5)
WBC: 22.9 10*3/uL — ABNORMAL HIGH (ref 4.0–10.5)
nRBC: 0 % (ref 0.0–0.2)

## 2023-11-25 LAB — CULTURE, BLOOD (ROUTINE X 2): Culture: NO GROWTH

## 2023-11-25 MED ORDER — DIPHENOXYLATE-ATROPINE 2.5-0.025 MG PO TABS
1.0000 | ORAL_TABLET | Freq: Four times a day (QID) | ORAL | Status: DC
  Administered 2023-11-25 – 2023-11-29 (×16): 1 via ORAL
  Filled 2023-11-25 (×15): qty 1

## 2023-11-25 MED ORDER — TRACE MINERALS CU-MN-SE-ZN 300-55-60-3000 MCG/ML IV SOLN
INTRAVENOUS | Status: AC
  Filled 2023-11-25: qty 582.4

## 2023-11-25 MED ORDER — IOHEXOL 300 MG/ML  SOLN
100.0000 mL | Freq: Once | INTRAMUSCULAR | Status: AC | PRN
  Administered 2023-11-25: 100 mL via INTRAVENOUS

## 2023-11-25 MED ORDER — IOHEXOL 9 MG/ML PO SOLN
500.0000 mL | ORAL | Status: AC
  Administered 2023-11-25: 500 mL via ORAL

## 2023-11-25 MED ORDER — IOHEXOL 350 MG/ML SOLN
100.0000 mL | Freq: Once | INTRAVENOUS | Status: DC | PRN
Start: 2023-11-25 — End: 2023-11-25

## 2023-11-25 NOTE — Progress Notes (Signed)
 Physical Therapy Treatment Patient Details Name: Kristin Miller MRN: [id number] DOB: [birth year] Today's Date: 11/25/2023   History of Present Illness Kristin Miller presented to [facility] on 4/25 for planned surgery, with the diagnosis of colostomy complication; now s/p revision. Other dx include back pain, glaucoma, HTN, COPD.    PT Comments  Pt was pleasant and motivated to participate during the session and put forth good effort throughout. Pt education/training provided on log roll sequencing for pain control with bed mobility tasks as well as for decreased caregiver assist.  Pt required only min A for BLE and trunk control during sidelying to/from sit.  Pt required only min A with transfers along with cuing for hand placement.  Pt was able to take several small steps forwards/backwards and laterally at the EOB 2 x 6 feet with seated therapeutic rest break between bouts. Very slow cadence and short B step length but generally steady without LOB. Pt found on room air with SpO2 94%, ok for trial on room air per nursing.  Pt's SpO2 remained 93-94% during the session with pt left on RA, nursing notified.  Pt will benefit from continued PT services upon discharge to safely address deficits listed in patient problem list for decreased caregiver assistance and eventual return to PLOF.      If plan is discharge home, recommend the following: A lot of help with walking and/or transfers;A lot of help with bathing/dressing/bathroom;Help with stairs or ramp for entrance   Can travel by private vehicle     No  Equipment Recommendations  Rolling walker (2 wheels)    Recommendations for Other Services       Precautions / Restrictions Precautions Precautions: Fall Recall of Precautions/Restrictions: Intact Restrictions Weight Bearing Restrictions Per Provider Order: No Other Position/Activity Restrictions: Multiple drains     Mobility  Bed Mobility Overal bed mobility: Needs Assistance Bed  Mobility: Rolling, Sidelying to Sit, Sit to Sidelying Rolling: Min assist Sidelying to sit: Min assist     Sit to sidelying: Min assist General bed mobility comments: Log roll training for decreased caregiver assistance and for pain control    Transfers Overall transfer level: Needs assistance Equipment used: Rolling walker (2 wheels) Transfers: Sit to/from Stand Sit to Stand: Contact guard assist           General transfer comment: Min verbal cues for hand placement    Ambulation/Gait   Gait Distance (Feet): 6 Feet Assistive device: Rolling walker (2 wheels) Gait Pattern/deviations: Step-through pattern, Decreased step length - right, Decreased step length - left Gait velocity: decreased     General Gait Details: Pt able to take several small steps forwards/backwards and laterally at the EOB 2 x 6 feet with seated therapeutic rest break between bouts.  Very slow cadence and short B step length but generally steady without LOB.   Stairs             Wheelchair Mobility     Tilt Bed    Modified Rankin (Stroke Patients Only)       Balance Overall balance assessment: Needs assistance Sitting-balance support: Feet supported Sitting balance-Leahy Scale: Good     Standing balance support: Bilateral upper extremity supported, Reliant on assistive device for balance, During functional activity Standing balance-Leahy Scale: Good                              Communication Communication Communication: No apparent difficulties  Cognition Arousal: Alert  Behavior During Therapy: WFL for tasks assessed/performed   PT - Cognitive impairments: No apparent impairments                         Following commands: Intact      Cueing Cueing Techniques: Verbal cues, Tactile cues  Exercises Other Exercises Other Exercises: Log roll training    General Comments        Pertinent Vitals/Pain Pain Assessment Pain Assessment: No/denies pain     Home Living                          Prior Function            PT Goals (current goals can now be found in the care plan section) Progress towards PT goals: Progressing toward goals    Frequency    Min 2X/week      PT Plan      Co-evaluation              AM-PAC PT "6 Clicks" Mobility   Outcome Measure  Help needed turning from your back to your side while in a flat bed without using bedrails?: A Little Help needed moving from lying on your back to sitting on the side of a flat bed without using bedrails?: A Little Help needed moving to and from a bed to a chair (including a wheelchair)?: A Little Help needed standing up from a chair using your arms (e.g., wheelchair or bedside chair)?: A Little Help needed to walk in hospital room?: A Lot Help needed climbing 3-5 steps with a railing? : Total 6 Click Score: 15    End of Session Equipment Utilized During Treatment: Gait belt Activity Tolerance: Patient tolerated treatment well Patient left: in bed;with family/visitor present;with bed alarm set;with call bell/phone within reach Nurse Communication: Mobility status (Pt found on room air and left on room air at end of session with SpO2 93-94% throughout) PT Visit Diagnosis: Muscle weakness (generalized) (M62.81);Difficulty in walking, not elsewhere classified (R26.2);Unsteadiness on feet (R26.81)     Time: 1610-9604 PT Time Calculation (min) (ACUTE ONLY): 23 min  Charges:    $Gait Training: 8-22 mins $Therapeutic Activity: 8-22 mins PT General Charges $$ ACUTE PT VISIT: 1 Visit                     D. Scott Kervin Bones PT, DPT 11/25/23, 10:53 AM

## 2023-11-25 NOTE — Progress Notes (Signed)
 CC: fistula Subjective: No new complaints Drains total 345, surgical drain are the active ones.  Wbc up but afebrile CT small collection Right side we will d/w IR, I messaged Dr. Melven Stable Ostomy with good output  Objective: Vital signs in last 24 hours: Temp:  [97.7 F (36.5 C)-98.4 F (36.9 C)] 98.4 F (36.9 C) (05/07 0738) Pulse Rate:  [80-97] 80 (05/07 0738) Resp:  [17-18] 18 (05/07 0516) BP: (116-152)/(56-70) 146/64 (05/07 0738) SpO2:  [94 %-99 %] 99 % (05/07 0738) FiO2 (%):  [28 %] 28 % (05/06 2022) Weight:  [61.1 kg] 61.1 kg (05/07 0500) Last BM Date : 11/25/23  Intake/Output from previous day: 05/06 0701 - 05/07 0700 In: 2352.2 [I.V.:1226.2; IV Piggyback:1106] Out: 2345 [Urine:1800; Drains:345; Stool:200] Intake/Output this shift: No intake/output data recorded.  Physical exam:  Debilitated Abd: soft, bilious drainage, now Left side seems to be more active. Drain holding suction. No peritonitis. Surgical drains are the ones controlling fistula, IR drain not doing much. Ostomy with good liquid output   Lab Results: CBC  Recent Labs    11/24/23 0509 11/25/23 0607  WBC 21.3* 22.9*  HGB 7.9* 7.8*  HCT 24.4* 23.1*  PLT 516* 653*   BMET Recent Labs    11/23/23 0527  NA 136  K 4.6  CL 98  CO2 26  GLUCOSE 188*  BUN 16  CREATININE 0.52  CALCIUM 8.5*   PT/INR No results for input(s): "LABPROT", "INR" in the last 72 hours. ABG No results for input(s): "PHART", "HCO3" in the last 72 hours.  Invalid input(s): "PCO2", "PO2"  Studies/Results: No results found.  Anti-infectives: Anti-infectives (From admission, onward)    Start     Dose/Rate Route Frequency Ordered Stop   11/18/23 1015  fluconazole  (DIFLUCAN ) IVPB 200 mg        200 mg 100 mL/hr over 60 Minutes Intravenous Every 24 hours 11/18/23 0930     11/13/23 1700  Ampicillin -Sulbactam (UNASYN ) 3 g in sodium chloride  0.9 % 100 mL IVPB        3 g 200 mL/hr over 30 Minutes Intravenous Every 6 hours  11/13/23 1555 11/27/23 1759   11/13/23 0600  cefoTEtan  (CEFOTAN ) 2 g in sodium chloride  0.9 % 100 mL IVPB        2 g 200 mL/hr over 30 Minutes Intravenous On call to O.R. 11/13/23 0043 11/13/23 0954       Assessment/Plan:  EC fistula controlled by drains, responding to current rx, Continue antibiotics Priomising that ostomy starting working Avoiding surgical re intervention Start lomotil  Milmay, MD, [facility]  11/25/2023

## 2023-11-25 NOTE — Plan of Care (Signed)

## 2023-11-25 NOTE — Consult Note (Addendum)
 Consult Note   Patient: Kristin Miller FAO:[id number] DOB: July 13, 1936 DOA: 11/13/2023     12 DOS: the patient was seen and examined on 11/25/2023   Brief hospital course:  Pt. with PMH of hypertension, colon cancer, adenocarcinoma s/p resection and colostomy, right breast cancer, T-spine fracture, left shoulder BCC, admitted on 11/13/2023, with complaint of leaking of ostomy site patient was admitted by general surgery for ostomy revision, was found to have dyspnea and hypoxia.  So [facility] was consulted for further management.    Assessment and Plan:   Acute hypoxic respiratory failure - Initially requiring 2 L nasal cannula.  Likely multifactorial etiology given underlying COPD as well as mild exacerbation of HFpE, atelectasis.  Continues to show improvement.  Satting normally on room air today  Acute exacerbation of HFpEF - Likely secondary to aggressive volume resuscitation.  Chest x-ray personally reviewed by prior provider noting congestion with mild pleural effusion.  Will hold on additional lasix    COPD exacerbation - appears resolved   Acute blood loss anemia - Likely secondary to surgical intervention and marrow stunting with hemodilution. Hgb stable around 8 - trend   Necrosis of end colostomy   - revision of end colostomy with partial colectomy and resiting of end colostomy with repair of parastomal hernia performed on 4/25. IR placed b/l drains on 4/25. Surgical culture growing candida, is currently on fluconazole  and unasyn . Ostomy has output. Currently on tpn. CT today with large intraperitoneal collection containing air and contrast consistent w/ perforation, gen surg notified.    Adenocarcinoma of the colon - S/p robotic assisted laparoscopic partial colectomy 10/27/23.  Readmitted 4/25 as above   Subjective: feeling ok. No significant pain. Breathing comfortably  Physical Exam: Vitals:   11/25/23 0516 11/25/23 0720 11/25/23 0738 11/25/23 1429  BP: (!) 152/70  (!) 146/64  126/62  Pulse: 85  80 83  Resp: 18     Temp:   98.4 F (36.9 C) 98.7 F (37.1 C)  TempSrc:    Oral  SpO2: 98% 97% 99% 94%  Weight:      Height:        GENERAL:  Alert, pleasant, no acute distress  CARDIOVASCULAR:  RRR,  RESPIRATORY:  Clear to auscultation, no wheezing, rales, or rhonchi GASTROINTESTINAL:  Soft, diffusely minimally tender, stoma present, bilateral drains present, anterior surgical incisions, ostomy with dark drainage EXTREMITIES:  No LE edema bilaterally NEURO:  No new focal deficits appreciated SKIN:  No rashes noted PSYCH:  Appropriate mood and affect    Data Reviewed:  No new imaging to review  Labs: CBC: Recent Labs  Lab 11/21/23 0704 11/22/23 0411 11/23/23 0527 11/24/23 0509 11/25/23 0607  WBC 16.4* 21.1* 23.8* 21.3* 22.9*  NEUTROABS 11.0* 15.4* 18.5* 16.2* 17.5*  HGB 7.2* 7.8* 8.3* 7.9* 7.8*  HCT 22.3* 24.2* 25.6* 24.4* 23.1*  MCV 85.1 84.6 84.2 85.6 83.7  PLT 451* 458* 496* 516* 653*   Basic Metabolic Panel: Recent Labs  Lab 11/19/23 0516 11/20/23 0501 11/20/23 0706 11/21/23 0704 11/23/23 0527  NA 142 138  --  138 136  K 4.0 4.1  --  4.2 4.6  CL 109 104  --  102 98  CO2 22 23  --  26 26  GLUCOSE 122* 134*  --  150* 188*  BUN 15 13  --  15 16  CREATININE 0.65 0.62  --  0.54 0.52  CALCIUM 7.3* 7.9*  --  7.9* 8.5*  MG 2.1  --  1.9 1.9 2.0  PHOS 2.6  --  2.9 3.4 4.3   Liver Function Tests: Recent Labs  Lab 11/19/23 0516 11/23/23 0527  AST 19 15  ALT 9 8  ALKPHOS 63 125  BILITOT 0.3 0.4  PROT 5.3* 6.1*  ALBUMIN  1.7* 2.0*   CBG: Recent Labs  Lab 11/24/23 1208 11/24/23 1814 11/24/23 2325 11/25/23 0521 11/25/23 1144  GLUCAP 161* 131* 156* 163* 164*    Scheduled Meds:  acetaminophen   1,000 mg Oral Q6H   alteplase   2 mg Intracatheter Once   amLODipine   5 mg Oral Daily   arformoterol   15 mcg Nebulization BID   budesonide  (PULMICORT ) nebulizer solution  0.25 mg Nebulization BID   diphenoxylate-atropine  1 tablet Oral  QID   enoxaparin  (LOVENOX ) injection  40 mg Subcutaneous Q24H   insulin  aspart  0-9 Units Subcutaneous Q6H   latanoprost   1 drop Both Eyes QHS   loperamide   4 mg Oral Q6H   octreotide   200 mcg Subcutaneous Q8H   pantoprazole  (PROTONIX ) IV  40 mg Intravenous Q12H   sodium chloride  flush  5 mL Intracatheter Q8H   Continuous Infusions:  ampicillin -sulbactam (UNASYN ) IV 3 g (11/25/23 1156)   fluconazole  (DIFLUCAN ) IV 200 mg (11/25/23 0949)   iron  sucrose 200 mg (11/24/23 1629)   TPN ADULT (ION) 65 mL/hr at 11/24/23 1901   TPN ADULT (ION)     PRN Meds:.diphenhydrAMINE  **OR** diphenhydrAMINE , hydrALAZINE , ipratropium-albuterol , methocarbamol  **OR** methocarbamol  (ROBAXIN ) injection, morphine  injection, ondansetron  (ZOFRAN ) IV, mouth rinse, oxyCODONE , prochlorperazine  **OR** prochlorperazine   Family Communication: Husband at bedside  Disposition: Per primary team     Author: Raymonde Calico, MD 11/25/2023 5:06 PM  For on call review [email or web address].

## 2023-11-25 NOTE — Consult Note (Signed)
 PHARMACY - TOTAL PARENTERAL NUTRITION CONSULT NOTE   Indication: Prolonged ileus  Patient Measurements: Height: 5' (152.4 cm) Weight: 61.1 kg (134 lb 11.2 oz) IBW/kg (Calculated) : 45.5 TPN AdjBW (KG): 51.8 Body mass index is 26.31 kg/m. Usual Weight: 64.9  Assessment:  NE is a 88 yo female who presented with colostomy issues w separation of mucocutaneous junction and distal necrosis post parietal colectomy due to adenocarcinoma of colon. Previously colectomy was 4/8. They underwent revision of colostomy on 4/25. Pharmacy has been consulted to manage this patient's TPN.   Glucose / Insulin : BG 131-163 7 units of novolog  in the last 24 hours,  sSSI q6h.  Electrolytes: no labs 5/7 Renal: Scr < 1 Hepatic: WNL Intake / Output; MIVF: No MIVF at this time  Intake/Output Summary (Last 24 hours) at 11/25/2023 1610 Last data filed at 11/25/2023 0600 Gross per 24 hour  Intake 2352.23 ml  Output 2345 ml  Net 7.23 ml    GI Imaging: 4/23: CT Abdomen Pelvis 1. Surgical changes from a left colectomy and left lower colostomy. The distal colostomy is abnormal. Beginning approximately 10 cm from the ostomy site the colon is very amorphous and is filled with fluid. There are abnormal gas collections around the bowel and there is a large collection of gas in the subcutaneous tissues. Findings very worrisome for necrosis of the distal 10 cm of colon. 4/30 CT abd/pel: --Multiple fluid collections are noted throughout the peritoneal space, concerning for possible abscesses.  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, complicated by pertinent comorbid conditions   --2 new drains placed yesterday 5/2 by interventional radiology in 2 new right-sided fluid collections   Central access: 11/17/23 TPN start date:  11/17/23  Nutritional Goals: Goal TPN rate is 65 mL/hr (provides 87 g of protein and 1666 kcals per day)  RD Assessment: Estimated Needs Total Energy Estimated Needs: 1600-1800kcal/day Total Protein Estimated Needs: 80-90g/day Total Fluid Estimated Needs: 1.4-1.6L/day  Current Nutrition:  NPO  Plan:  ---Continue custom TPN at goal of 65 mL/hr  --- Electrolytes in TPN: Na 23mEq/L, K 7mEq/L, Ca 49mEq/L, Mg 5 mEq/L, and Phos 15. Cl:Ac 1:1 ---Add standard MVI and trace elements to TPN ---Continue Sensitive q6h SSI and adjust as needed  ---chromium 10 mcg/day per dietary recommendations:  unable to provide at this time due to national backorder status, removed from TPN 11/24/23 ---Monitor TPN labs on Mon/Thurs  Kristin Miller A, PharmD 11/25/2023,9:22 AM

## 2023-11-25 NOTE — Progress Notes (Signed)
 Nutrition Follow-up  DOCUMENTATION CODES:   Not applicable  INTERVENTION:   Continue TPN per pharmacy  Recommend chromium 10mcg daily added to TPN (on hold r/t backorder)  Daily weights   NUTRITION DIAGNOSIS:   Inadequate oral intake related to altered GI function as evidenced by NPO status. -ongoing   GOAL:   Patient will meet greater than or equal to 90% of their needs -met   MONITOR:   Diet advancement, Labs, Weight trends, Skin, I & O's, Other (Comment) (TPN)  ASSESSMENT:   88 y/o female with h/o HTN, breast cancer s/p lumpectomy/XRT, IDA, HTN, chronic back pain, colon cancer (s/p open low anterior resection with end colostomy, abdominal wall resection to include psoas and left transversus abdominus muscles and left oophorectomy 07/2022 & s/p XRT) complicated by SBO (medically managed 07/2022) and flank hernia (s/p robotic assisted laparoscopic left colectomy w/ end colostomy, repair parastomal hernia and repair enterotomy 10/27/2023) and ostomy necrosis s/p revision of end colostomy requiring partial colectomy and resiting of the end colostomy to the right of the abdominal wall & repair of parastomal hernia 4/25 complicated by fluid collections and 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  Pt s/p IR drain placement x 2 on 5/2  Pt tolerating TPN well at goal rate. Refeed labs stable. Pt remains NPO. Pt is having ostomy function. CT abdomen pending. Per chart, is up ~5lbs from her UBW; pt did receive lasix  yesterday. Good UOP noted. RD will monitor for diet advancement vs the need for ongoing nutrition support.    Medications reviewed and include: lomotil, lovenox , insulin , imodium , octroetide, protonix , unasyn , diflucan , iron  sucrose, TPN  Labs reviewed: K 4.6 wnl, P 4.3 wnl, Mg 2.0 wnl Triglycerides 74- 5/5 BNP- 853.3(H)- 5/2 Wbc- 22.9(H), Hgb 7.8(L), Hct 23.1(L) Cbgs- 164, 163 x 24 hrs   Drains-   UOP- 1800  Diet Order:   Diet Order             Diet NPO time specified Except  for: Ice Chips, Sips with Meds  Diet effective now                  EDUCATION NEEDS:   Education needs have been addressed  Skin:  Skin Assessment: Reviewed RN Assessment  Last BM:  5/7- via ostomy  Height:   Ht Readings from Last 1 Encounters:  11/20/23 5' (1.524 m)    Weight:   Wt Readings from Last 1 Encounters:  11/25/23 61.1 kg   BMI:  Body mass index is 26.31 kg/m.  Estimated Nutritional Needs:   Kcal:  1600-1800kcal/day  Protein:  80-90g/day  Fluid:  1.4-1.6L/day  Torrance Freestone MS, RD, LDN If unable to be reached, please send secure chat to "RD inpatient" available from 8:00a-4:00p daily

## 2023-11-25 NOTE — Plan of Care (Signed)
  Problem: Clinical Measurements: Goal: Ability to maintain clinical measurements within normal limits will improve Outcome: Progressing   Problem: Clinical Measurements: Goal: Diagnostic test results will improve Outcome: Progressing   Problem: Activity: Goal: Risk for activity intolerance will decrease Outcome: Progressing   Problem: Coping: Goal: Level of anxiety will decrease Outcome: Progressing   Problem: Pain Managment: Goal: General experience of comfort will improve and/or be controlled Outcome: Progressing

## 2023-11-26 DIAGNOSIS — C189 Malignant neoplasm of colon, unspecified: Secondary | ICD-10-CM | POA: Diagnosis not present

## 2023-11-26 LAB — CULTURE, BLOOD (ROUTINE X 2)
Culture: NO GROWTH
Special Requests: ADEQUATE

## 2023-11-26 LAB — PHOSPHORUS: Phosphorus: 3.7 mg/dL (ref 2.5–4.6)

## 2023-11-26 LAB — COMPREHENSIVE METABOLIC PANEL WITH GFR
ALT: 9 U/L (ref 0–44)
AST: 15 U/L (ref 15–41)
Albumin: 1.8 g/dL — ABNORMAL LOW (ref 3.5–5.0)
Alkaline Phosphatase: 159 U/L — ABNORMAL HIGH (ref 38–126)
Anion gap: 9 (ref 5–15)
BUN: 22 mg/dL (ref 8–23)
CO2: 27 mmol/L (ref 22–32)
Calcium: 8.3 mg/dL — ABNORMAL LOW (ref 8.9–10.3)
Chloride: 99 mmol/L (ref 98–111)
Creatinine, Ser: 0.67 mg/dL (ref 0.44–1.00)
GFR, Estimated: >60 mL/min (ref >60)
Glucose, Bld: 140 mg/dL — ABNORMAL HIGH (ref 70–99)
Potassium: 4.3 mmol/L (ref 3.5–5.1)
Sodium: 135 mmol/L (ref 135–145)
Total Bilirubin: 0.5 mg/dL (ref 0.0–1.2)
Total Protein: 6.2 g/dL — ABNORMAL LOW (ref 6.5–8.1)

## 2023-11-26 LAB — GLUCOSE, CAPILLARY
Glucose-Capillary: 138 mg/dL — ABNORMAL HIGH (ref 70–99)
Glucose-Capillary: 146 mg/dL — ABNORMAL HIGH (ref 70–99)
Glucose-Capillary: 173 mg/dL — ABNORMAL HIGH (ref 70–99)
Glucose-Capillary: 140 mg/dL — ABNORMAL HIGH (ref 70–99)

## 2023-11-26 LAB — MAGNESIUM: Magnesium: 2.2 mg/dL (ref 1.7–2.4)

## 2023-11-26 MED ORDER — SODIUM CHLORIDE 0.9% FLUSH
10.0000 mL | Freq: Two times a day (BID) | INTRAVENOUS | Status: DC
  Administered 2023-11-26 – 2023-11-28 (×2): 10 mL
  Administered 2023-11-28: 20 mL
  Administered 2023-11-29 – 2023-11-30 (×3): 10 mL
  Administered 2023-11-30: 20 mL
  Administered 2023-12-01: 10 mL
  Administered 2023-12-01: 20 mL
  Administered 2023-12-02 – 2023-12-15 (×27): 10 mL

## 2023-11-26 MED ORDER — CHLORHEXIDINE GLUCONATE CLOTH 2 % EX PADS
6.0000 | MEDICATED_PAD | Freq: Every day | CUTANEOUS | Status: DC
  Administered 2023-11-26 – 2023-12-15 (×17): 6 via TOPICAL

## 2023-11-26 MED ORDER — TRACE MINERALS CU-MN-SE-ZN 300-55-60-3000 MCG/ML IV SOLN
INTRAVENOUS | Status: AC
  Filled 2023-11-26: qty 582.4

## 2023-11-26 MED ORDER — SODIUM CHLORIDE 0.9% FLUSH
10.0000 mL | INTRAVENOUS | Status: DC | PRN
  Administered 2023-12-14: 20 mL
  Administered 2023-12-14: 40 mL

## 2023-11-26 NOTE — Consult Note (Signed)
 PHARMACY - TOTAL PARENTERAL NUTRITION CONSULT NOTE   Indication: Prolonged ileus  Patient Measurements: Height: 5' (152.4 cm) Weight: 58.2 kg (128 lb 4.9 oz) IBW/kg (Calculated) : 45.5 TPN AdjBW (KG): 51.8 Body mass index is 25.06 kg/m. Usual Weight: 64.9  Assessment:  NE is a 88 yo female who presented with colostomy issues w separation of mucocutaneous junction and distal necrosis post parietal colectomy due to adenocarcinoma of colon. Previously colectomy was 4/8. They underwent revision of colostomy on 4/25. Pharmacy has been consulted to manage this patient's TPN.   Glucose / Insulin : BG 140-164 7 units of novolog  in the last 24 hours,  sSSI q6h.  Electrolytes: WNL Renal: Scr < 1 Hepatic: WNL Intake / Output; net (-) MIVF: No MIVF at this time  Intake/Output Summary (Last 24 hours) at 11/26/2023 0658 Last data filed at 11/26/2023 7829 Gross per 24 hour  Intake 2342.36 ml  Output 1515 ml  Net 827.36 ml    GI Imaging: 4/23: CT Abdomen Pelvis 1. Surgical changes from a left colectomy and left lower colostomy. The distal colostomy is abnormal. Beginning approximately 10 cm from the ostomy site the colon is very amorphous and is filled with fluid. There are abnormal gas collections around the bowel and there is a large collection of gas in the subcutaneous tissues. Findings very worrisome for necrosis of the distal 10 cm of colon. 4/30 CT abd/pel: --Multiple fluid collections are noted throughout the peritoneal space, concerning for possible abscesses.  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, complicated by pertinent comorbid conditions   --2 new drains placed yesterday 5/2 by interventional radiology in 2 new right-sided fluid collections   Central access: 11/17/23 TPN start date:  11/17/23  Nutritional Goals: Goal TPN rate is 65 mL/hr (provides 87 g of protein and 1666 kcals per day)  RD Assessment: Estimated Needs Total Energy Estimated Needs: 1600-1800kcal/day Total Protein Estimated Needs: 80-90g/day Total Fluid Estimated Needs: 1.4-1.6L/day  Current Nutrition:  NPO  Plan:  ---Continue custom TPN at goal of 65 mL/hr  --- Electrolytes in TPN: Na 38mEq/L, K 74mEq/L, Ca 60mEq/L, Mg 5 mEq/L, and Phos 15. Cl:Ac 1:1 ---Add standard MVI and trace elements to TPN ---Continue Sensitive q6h SSI and adjust as needed  ---chromium 10 mcg/day per dietary recommendations:  unable to provide at this time due to national backorder status, removed from TPN 11/24/23 ---Monitor TPN labs on Mon/Thurs  Adalberto Acton, PharmD 11/26/2023,6:58 AM

## 2023-11-26 NOTE — Consult Note (Signed)
 Consult Note   Patient: Kristin Miller WUJ:[id number] DOB: 1936/04/25 DOA: 11/13/2023     13 DOS: the patient was seen and examined on 11/26/2023   Brief hospital course:  Pt. with PMH of hypertension, colon cancer, adenocarcinoma s/p resection and colostomy, right breast cancer, T-spine fracture, left shoulder BCC, admitted on 11/13/2023, with complaint of leaking of ostomy site patient was admitted by general surgery for ostomy revision, was found to have dyspnea and hypoxia.  So [facility] was consulted for further management.    Assessment and Plan:   Acute hypoxic respiratory failure - Initially requiring 2 L nasal cannula.  Likely multifactorial etiology given underlying COPD as well as mild exacerbation of HFpE, atelectasis.  Continues to show improvement.  Satting normally on room air today  Acute exacerbation of HFpEF - Likely secondary to aggressive volume resuscitation.  Chest x-ray reviewed by prior provider noting congestion with mild pleural effusion.  Will hold on additional lasix  as now appears to be euvolemic   COPD exacerbation Appears resolved   Acute blood loss anemia - Likely secondary to surgical intervention and marrow stunting with hemodilution. Hgb stable around 8   Necrosis of end colostomy   - revision of end colostomy with partial colectomy and resiting of end colostomy with repair of parastomal hernia performed on 4/25. IR placed b/l drains on 4/25. Surgical culture growing candida, is currently on fluconazole  and unasyn . Ostomy has output. Currently on tpn.    Adenocarcinoma of the colon - S/p robotic assisted laparoscopic partial colectomy 10/27/23.  Readmitted 4/25 as above   Subjective: feeling ok. No significant pain. Breathing comfortably. ost  Physical Exam: Vitals:   11/26/23 0334 11/26/23 0500 11/26/23 0709 11/26/23 0732  BP: (!) 134/56   (!) 131/51  Pulse: 81   82  Resp: 20     Temp: 99.3 F (37.4 C)   98.9 F (37.2 C)  TempSrc:    Oral  SpO2: 92%   93% 92%  Weight:  58.2 kg    Height:        GENERAL:  Alert, pleasant, no acute distress  CARDIOVASCULAR:  RRR,  RESPIRATORY:  Clear to auscultation, no wheezing, rales, or rhonchi GASTROINTESTINAL:  Soft, diffusely minimally tender, stoma present, bilateral drains present, anterior surgical incisions, ostomy with dark drainage EXTREMITIES:  No LE edema bilaterally NEURO:  No new focal deficits appreciated SKIN:  No rashes noted PSYCH:  Appropriate mood and affect    Data Reviewed:  No new imaging to review  Labs: CBC: Recent Labs  Lab 11/21/23 0704 11/22/23 0411 11/23/23 0527 11/24/23 0509 11/25/23 0607  WBC 16.4* 21.1* 23.8* 21.3* 22.9*  NEUTROABS 11.0* 15.4* 18.5* 16.2* 17.5*  HGB 7.2* 7.8* 8.3* 7.9* 7.8*  HCT 22.3* 24.2* 25.6* 24.4* 23.1*  MCV 85.1 84.6 84.2 85.6 83.7  PLT 451* 458* 496* 516* 653*   Basic Metabolic Panel: Recent Labs  Lab 11/20/23 0501 11/20/23 0706 11/21/23 0704 11/23/23 0527 11/26/23 0453  NA 138  --  138 136 135  K 4.1  --  4.2 4.6 4.3  CL 104  --  102 98 99  CO2 23  --  26 26 27   GLUCOSE 134*  --  150* 188* 140*  BUN 13  --  15 16 22   CREATININE 0.62  --  0.54 0.52 0.67  CALCIUM 7.9*  --  7.9* 8.5* 8.3*  MG  --  1.9 1.9 2.0 2.2  PHOS  --  2.9 3.4 4.3 3.7   Liver  Function Tests: Recent Labs  Lab 11/23/23 0527 11/26/23 0453  AST 15 15  ALT 8 9  ALKPHOS 125 159*  BILITOT 0.4 0.5  PROT 6.1* 6.2*  ALBUMIN  2.0* 1.8*   CBG: Recent Labs  Lab 11/25/23 1706 11/25/23 2347 11/26/23 0337 11/26/23 0524 11/26/23 1128  GLUCAP 157* 160* 138* 146* 173*    Scheduled Meds:  acetaminophen   1,000 mg Oral Q6H   alteplase   2 mg Intracatheter Once   amLODipine   5 mg Oral Daily   arformoterol   15 mcg Nebulization BID   budesonide  (PULMICORT ) nebulizer solution  0.25 mg Nebulization BID   Chlorhexidine  Gluconate Cloth  6 each Topical Daily   diphenoxylate-atropine  1 tablet Oral QID   enoxaparin  (LOVENOX ) injection  40 mg  Subcutaneous Q24H   insulin  aspart  0-9 Units Subcutaneous Q6H   latanoprost   1 drop Both Eyes QHS   loperamide   4 mg Oral Q6H   octreotide   200 mcg Subcutaneous Q8H   pantoprazole  (PROTONIX ) IV  40 mg Intravenous Q12H   sodium chloride  flush  5 mL Intracatheter Q8H   Continuous Infusions:  ampicillin -sulbactam (UNASYN ) IV 3 g (11/26/23 1146)   fluconazole  (DIFLUCAN ) IV 200 mg (11/26/23 0924)   iron  sucrose 200 mg (11/24/23 1629)   TPN ADULT (ION) 65 mL/hr at 11/26/23 0608   TPN ADULT (ION)     PRN Meds:.diphenhydrAMINE  **OR** diphenhydrAMINE , hydrALAZINE , ipratropium-albuterol , methocarbamol  **OR** methocarbamol  (ROBAXIN ) injection, morphine  injection, ondansetron  (ZOFRAN ) IV, mouth rinse, oxyCODONE , prochlorperazine  **OR** prochlorperazine    Disposition: Per primary team     Author: Raymonde Calico, MD 11/26/2023 1:35 PM  For on call review [email or web address]

## 2023-11-26 NOTE — Progress Notes (Signed)
 CC: fistula Subjective: Better spirits today, afebrile. No abd pain Drains slowing down ans ostomy working Drains 165cc Ct d/w Dr. Melven Stable. We will hold off any new drains for now  Objective: Vital signs in last 24 hours: Temp:  [98.5 F (36.9 C)-99.3 F (37.4 C)] 98.9 F (37.2 C) (05/08 0732) Pulse Rate:  [81-89] 82 (05/08 0732) Resp:  [20] 20 (05/08 0334) BP: (126-134)/(51-62) 131/51 (05/08 0732) SpO2:  [92 %-94 %] 92 % (05/08 0732) Weight:  [58.2 kg] 58.2 kg (05/08 0500) Last BM Date : 11/25/23 (ostomy)  Intake/Output from previous day: 05/07 0701 - 05/08 0700 In: 2342.4 [P.O.:300; I.V.:1542.4; IV Piggyback:500] Out: 1515 [Urine:1000; Drains:165; Stool:350] Intake/Output this shift: Total I/O In: -  Out: 50 [Drains:50]  Physical exam:  Debilitated Abd: soft, bilious drainage, now Left side seems to be more active. Drain holding suction. No peritonitis. Surgical drains are the ones controlling fistula, IR drain not doing much. Ostomy with good liquid output   Lab Results: CBC  Recent Labs    11/24/23 0509 11/25/23 0607  WBC 21.3* 22.9*  HGB 7.9* 7.8*  HCT 24.4* 23.1*  PLT 516* 653*   BMET Recent Labs    11/26/23 0453  NA 135  K 4.3  CL 99  CO2 27  GLUCOSE 140*  BUN 22  CREATININE 0.67  CALCIUM 8.3*   PT/INR No results for input(s): "LABPROT", "INR" in the last 72 hours. ABG No results for input(s): "PHART", "HCO3" in the last 72 hours.  Invalid input(s): "PCO2", "PO2"  Studies/Results: CT ABDOMEN PELVIS W CONTRAST Addendum Date: 11/25/2023 ADDENDUM REPORT: 11/25/2023 16:30 ADDENDUM: These results were called by telephone at the time of interpretation on 11/25/2023 at 4:30 pm to provider Afnan Cadiente , who verbally acknowledged these results. Electronically Signed   By: Angus Bark M.D.   On: 11/25/2023 16:30   Result Date: 11/25/2023 CLINICAL DATA:  History of enterocutaneous fistula and fluid collections. EXAM: CT ABDOMEN AND PELVIS WITH CONTRAST  TECHNIQUE: Multidetector CT imaging of the abdomen and pelvis was performed using the standard protocol following bolus administration of intravenous contrast. RADIATION DOSE REDUCTION: This exam was performed according to the departmental dose-optimization program which includes automated exposure control, adjustment of the mA and/or kV according to patient size and/or use of iterative reconstruction technique. CONTRAST:  OMNIPAQUE  IOHEXOL  300 MG/ML  SOLN COMPARISON:  CT abdomen pelvis dated 11/18/2023. FINDINGS: Lower chest: Small bilateral pleural effusions, significantly decreased since the prior CT. Bibasilar subsegmental atelectasis or pneumonia. Hepatobiliary: Small cyst in the dome of the liver. No biliary dilatation. The gallbladder is unremarkable. Pancreas: Unremarkable. No pancreatic ductal dilatation or surrounding inflammatory changes. Spleen: Normal in size without focal abnormality. Adrenals/Urinary Tract: The adrenal glands are unremarkable. There is no hydronephrosis on either side. There is symmetric enhancement and excretion of contrast by both kidneys. The visualized ureters and urinary bladder appear unremarkable. Stomach/Bowel: There is a moderate size hiatal hernia. Postsurgical changes of bowel with a right lower quadrant ostomy. No bowel obstruction. Vascular/Lymphatic: Moderate aortoiliac atherosclerotic disease. The IVC is unremarkable. No portal venous gas. No adenopathy. Reproductive: Hysterectomy. Other: Interval placement of a left anterior pigtail catheter with tip in the collection adjacent to ostomy. Two additional peritoneal drainage catheters with tips in similar position as the prior CT. There is a large collection in the anterior peritoneum containing air and contrast. In the absence of injected contrast through the catheters findings consistent with bowel perforation with a moderate-sized pneumoperitoneum and large amount of extraluminal  contrast. Extraluminal fecalized  content was noted in the pelvis on the prior CT. There is a loculated collection adjacent to the hiatal hernia containing air and fluid and measuring approximately 3.1 x 6.5 cm. Multiloculated collection with pockets of air in the upper abdomen has slightly decreased in size status post drainage catheter placement. Additional loculated collection in the left anterior abdomen abutting the left lobe of the liver measures approximately 2.7 x 7.0 cm and increased in size since the prior CT (previously approximately 2.0 x 4.1 cm). Musculoskeletal: Osteopenia with degenerative changes. No acute osseous pathology. Old T12 compression fracture with near complete loss of vertebral body height and anterior wedging. IMPRESSION: 1. Large intraperitoneal collection in the anterior pelvis containing air and contrast most consistent with perforation likely arising from the colostomy. Surgical consult is advised. 2. Additional loculated collections/abscesses as above. 3. Small bilateral pleural effusions, decreased since the prior CT. Bibasilar subsegmental atelectasis or pneumonia. 4.  Aortic Atherosclerosis (ICD10-I70.0). Electronically Signed: By: Angus Bark M.D. On: 11/25/2023 16:03    Anti-infectives: Anti-infectives (From admission, onward)    Start     Dose/Rate Route Frequency Ordered Stop   11/18/23 1015  fluconazole  (DIFLUCAN ) IVPB 200 mg        200 mg 100 mL/hr over 60 Minutes Intravenous Every 24 hours 11/18/23 0930     11/13/23 1700  Ampicillin -Sulbactam (UNASYN ) 3 g in sodium chloride  0.9 % 100 mL IVPB        3 g 200 mL/hr over 30 Minutes Intravenous Every 6 hours 11/13/23 1555 11/27/23 1759   11/13/23 0600  cefoTEtan  (CEFOTAN ) 2 g in sodium chloride  0.9 % 100 mL IVPB        2 g 200 mL/hr over 30 Minutes Intravenous On call to O.R. 11/13/23 0043 11/13/23 0954       Assessment/Plan:  EC fistula controlled by drains, responding to current rx, Continue antibiotics Promising that ostomy  starting working and hat JP output is slowly decreasing Avoiding surgical re intervention    Evelia Hipp, MD, [facility]  11/26/2023

## 2023-11-26 NOTE — TOC Progression Note (Signed)
 Transition of Care [facility]) - Progression Note    Patient Details  Name: Kristin Miller MRN: [id number] Date of Birth: 1935/11/06  [facility] CM/SW Contact  Loman Risk, RN Phone Number: 11/26/2023, 1:57 PM  Clinical Narrative:      Met with patient at bedside.  Husband not present at this time Patient still with TPN and NPO  Patient states that she would like to see what her options are for SNF when she is medically ready to leave the hospital,  Obtained PASRR Fl2 sent for signature Bed search initiated        Expected Discharge Plan and Services                                               Social Determinants of Health (SDOH) Interventions SDOH Screenings   Food Insecurity: No Food Insecurity (11/14/2023)  Housing: Low Risk  (11/24/2023)  Transportation Needs: No Transportation Needs (11/14/2023)  Utilities: Not At Risk (11/14/2023)  Alcohol Screen: Low Risk  (09/25/2021)  Depression (PHQ2-9): Low Risk  (08/25/2023)  Financial Resource Strain: Low Risk  (01/09/2023)   Received from [facility], [facility]  Physical Activity: Inactive (09/25/2021)  Social Connections: Moderately Integrated (11/14/2023)  Stress: No Stress Concern Present (09/25/2021)  Tobacco Use: Low Risk  (11/13/2023)    Readmission Risk Interventions    11/20/2023    8:57 AM  Readmission Risk Prevention Plan  Transportation Screening Complete  PCP or Specialist Appt within 5-7 Days Complete  Medication Review (RN CM) Complete

## 2023-11-26 NOTE — Progress Notes (Signed)
 Occupational Therapy Treatment Patient Details Name: Kristin Miller MRN: [id number] DOB: [birth year] Today's Date: 11/26/2023   History of present illness Kristin Miller presented to [facility] on 4/25 for planned surgery, with the diagnosis of colostomy complication; now s/p revision. Other dx include back pain, glaucoma, HTN, COPD.   OT comments  Pt seen for OT treatment on this date. Upon arrival to room pt lying in bed, agreeable to tx. Pt requires supervision for STS from EOB with RW. Pt amb around bed into recliner with good safety awareness and attention to RW, CGA throughout with drains and IV pole management by Bolivar Bushman. Pt completed grooming tasks in recliner, excited to be out of bed on this date. RN notified of pt position. Pt making good progress toward goals, will continue to follow POC. Discharge recommendation remains appropriate.        If plan is discharge home, recommend the following:  Assistance with cooking/housework;Assist for transportation;Help with stairs or ramp for entrance;A little help with bathing/dressing/bathroom;A little help with walking and/or transfers   Equipment Recommendations  Other (comment) (Next venue of care)    Recommendations for Other Services      Precautions / Restrictions Precautions Precautions: Fall Recall of Precautions/Restrictions: Intact Restrictions Weight Bearing Restrictions Per Provider Order: No Other Position/Activity Restrictions: Multiple drains       Mobility Bed Mobility Overal bed mobility: Needs Assistance Bed Mobility: Rolling, Sidelying to Sit Rolling: Supervision Sidelying to sit: Supervision            Transfers Overall transfer level: Needs assistance Equipment used: Rolling walker (2 wheels) Transfers: Sit to/from Stand Sit to Stand: Contact guard assist           General transfer comment: Improved STS from EOB, no physical assistance required     Balance Overall balance assessment: Needs  assistance Sitting-balance support: Feet supported Sitting balance-Leahy Scale: Good     Standing balance support: Bilateral upper extremity supported, Reliant on assistive device for balance, During functional activity Standing balance-Leahy Scale: Good                             ADL either performed or assessed with clinical judgement   ADL Overall ADL's : Needs assistance/impaired Eating/Feeding: Sitting;Independent (NPO, ice chips)   Grooming: Wash/dry face;Sitting;Set up                   Toilet Transfer: Contact guard assist;Ambulation;Rolling walker (2 wheels) (Simulated into recliner)           Functional mobility during ADLs: Contact guard assist;Rolling walker (2 wheels) General ADL Comments: Set up for face washing in recliner, simulated toilet t/f from EOB <> recliner CGA + RW    Extremity/Trunk Assessment              Vision       Perception     Praxis     Communication Communication Communication: No apparent difficulties   Cognition Arousal: Alert Behavior During Therapy: WFL for tasks assessed/performed Cognition: No apparent impairments             OT - Cognition Comments: A/O x4 throughout session                 Following commands: Intact        Cueing   Cueing Techniques: Verbal cues, Tactile cues  Exercises Exercises: Other exercises Other Exercises Other Exercises: Edu: Safe DME and sequencing during mobility  Shoulder Instructions       General Comments All drains in tact pre/post session    Pertinent Vitals/ Pain       Pain Assessment Pain Assessment: Faces Faces Pain Scale: Hurts a little bit Pain Location: abdomen Pain Descriptors / Indicators: Aching Pain Intervention(s): Monitored during session, Limited activity within patient's tolerance  Home Living                                          Prior Functioning/Environment              Frequency  Min  3X/week        Progress Toward Goals  OT Goals(current goals can now be found in the care plan section)  Progress towards OT goals: Progressing toward goals  Acute Rehab OT Goals Patient Stated Goal: Get better and get back home OT Goal Formulation: With patient Time For Goal Achievement: 12/05/23 Potential to Achieve Goals: Good ADL Goals Pt Will Perform Grooming: with modified independence;standing Pt Will Perform Lower Body Bathing: with modified independence;sit to/from stand Pt Will Perform Lower Body Dressing: with modified independence;sit to/from stand Pt Will Transfer to Toilet: with modified independence;ambulating Pt Will Perform Toileting - Clothing Manipulation and hygiene: with modified independence;sit to/from stand  Plan      Co-evaluation                 AM-PAC OT "6 Clicks" Daily Activity     Outcome Measure   Help from another person eating meals?: None Help from another person taking care of personal grooming?: A Little Help from another person toileting, which includes using toliet, bedpan, or urinal?: A Lot Help from another person bathing (including washing, rinsing, drying)?: A Little Help from another person to put on and taking off regular upper body clothing?: A Little Help from another person to put on and taking off regular lower body clothing?: A Lot 6 Click Score: 17    End of Session Equipment Utilized During Treatment: Gait belt;Rolling walker (2 wheels)  OT Visit Diagnosis: Muscle weakness (generalized) (M62.81)   Activity Tolerance Patient tolerated treatment well   Patient Left in chair;with call bell/phone within reach;with chair alarm set   Nurse Communication Mobility status        Time: 1610-9604 OT Time Calculation (min): 23 min  Charges: OT General Charges $OT Visit: 1 Visit OT Treatments $Therapeutic Activity: 8-22 mins  Rosaria Common M.S. OTR/L  11/26/23, 5:00 PM

## 2023-11-26 NOTE — Plan of Care (Signed)

## 2023-11-26 NOTE — NC FL2 (Signed)
   MEDICAID FL2 LEVEL OF CARE FORM     IDENTIFICATION  Patient Name: Kristin Miller Birthdate: [birth year] Sex: female Admission Date (Current Location): 11/13/2023  [facility] and [place] Number:  Chiropodist and Address:         Provider Number: [id number]  Attending Physician Name and Address:  Alben Alma, MD  Relative Name and Phone Number:       Current Level of Care: Hospital Recommended Level of Care: Skilled Nursing Facility Prior Approval Number:    Date Approved/Denied:   PASRR Number: [id number]  Discharge Plan: Home    Current Diagnoses: Patient Active Problem List   Diagnosis Date Noted   Acute hypoxic respiratory failure (HCC) 11/24/2023   Acute on chronic heart failure with preserved ejection fraction (HFpEF) (HCC) 11/24/2023   Acute on chronic anemia 11/24/2023   Parastomal hernia 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 gangrene 10/27/2023   Colon cancer (HCC) 10/27/2023   History of colon cancer 10/06/2023   Neoplasm of digestive system 10/06/2023   Age-related osteoporosis with current pathol fracture of vertebra, sequela 07/21/2023   Compression fracture of body of thoracic vertebra (HCC) 07/20/2023   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   Genetic testing 08/28/2022   Other constipation 08/14/2022   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   Rectal bleeding 08/04/2022   Gastroesophageal reflux disease with esophagitis 08/04/2022   GI bleeding 08/03/2022   SBO (small bowel obstruction) (HCC) 08/03/2022   Essential hypertension 08/03/2022   S/P partial resection of colon 07/29/2022   Malnutrition (HCC) 07/05/2022   Colon adenocarcinoma (HCC) 07/04/2022   Iron  deficiency anemia 06/27/2022   Primary open angle glaucoma (POAG) of both eyes, moderate stage 05/14/2022   BP (high blood pressure) 11/15/2015   OP (osteoporosis) 11/15/2015   Primary cancer of lower-outer quadrant of right breast (HCC) 06/20/2015   Low back pain 08/02/2014    HTN (hypertension) 08/02/2014   Superficial basal cell carcinoma (BCC) of skin of left shoulder 07/31/2014   Abnormal finding on mammography 10/03/2013   Chronic bilateral low back pain without sciatica 03/29/2012    Orientation RESPIRATION BLADDER Height & Weight     Self, Time, Situation, Place  Normal Continent Weight: 58.2 kg Height:  5' (152.4 cm)  BEHAVIORAL SYMPTOMS/MOOD NEUROLOGICAL BOWEL NUTRITION STATUS      Colostomy Diet (NPO and TPN.   TPN to be dc prior to discharge, and advance diet)  AMBULATORY STATUS COMMUNICATION OF NEEDS Skin   Extensive Assist Verbally Bruising, Surgical wounds, Other (Comment) (JP drain x2)                       Personal Care Assistance Level of Assistance              Functional Limitations Info             SPECIAL CARE FACTORS FREQUENCY  PT (By licensed PT), OT (By licensed OT)                    Contractures Contractures Info: Not present    Additional Factors Info  Code Status, Allergies Code Status Info: Full Allergies Info: Combigan  (Brimonidine  Tartrate-timolol ), Lisinopril , Other           Current Medications (11/26/2023):  This is the current hospital active medication list Current Facility-Administered Medications  Medication Dose Route Frequency Provider Last Rate Last Admin   acetaminophen  (TYLENOL ) tablet 1,000 mg  1,000 mg Oral Q6H Pabon, Marcial Setting, MD  1,000 mg at 11/26/23 1141   alteplase  (CATHFLO ACTIVASE ) injection 2 mg  2 mg Intracatheter Once Pabon, [place] F, MD       amLODipine  (NORVASC ) tablet 5 mg  5 mg Oral Daily Pabon, Diego F, MD   5 mg at 11/26/23 1610   Ampicillin -Sulbactam (UNASYN ) 3 g in sodium chloride  0.9 % 100 mL IVPB  3 g Intravenous Q6H Pabon, Diego F, MD 200 mL/hr at 11/26/23 1146 3 g at 11/26/23 1146   arformoterol  (BROVANA ) nebulizer solution 15 mcg  15 mcg Nebulization BID Althia Atlas, MD   15 mcg at 11/26/23 9604   budesonide  (PULMICORT ) nebulizer solution 0.25 mg  0.25 mg  Nebulization BID Althia Atlas, MD   0.25 mg at 11/26/23 5409   Chlorhexidine  Gluconate Cloth 2 % PADS 6 each  6 each Topical Daily Pabon, Diego F, MD       diphenhydrAMINE  (BENADRYL ) 12.5 MG/5ML elixir 12.5 mg  12.5 mg Oral Q6H PRN Pabon, Diego F, MD       Or   diphenhydrAMINE  (BENADRYL ) injection 12.5 mg  12.5 mg Intravenous Q6H PRN Pabon, Diego F, MD       diphenoxylate-atropine (LOMOTIL) 2.5-0.025 MG per tablet 1 tablet  1 tablet Oral QID Pabon, Diego F, MD   1 tablet at 11/26/23 1309   enoxaparin  (LOVENOX ) injection 40 mg  40 mg Subcutaneous Q24H Pabon, [place] Catalina F, MD   40 mg at 11/26/23 8119   fluconazole  (DIFLUCAN ) IVPB 200 mg  200 mg Intravenous Q24H Evelia Hipp F, MD 100 mL/hr at 11/26/23 0924 200 mg at 11/26/23 1478   hydrALAZINE  (APRESOLINE ) injection 10 mg  10 mg Intravenous Q2H PRN Pabon, Diego F, MD       insulin  aspart (novoLOG ) injection 0-9 Units  0-9 Units Subcutaneous Q6H Ramonita Burow, RPH   2 Units at 11/26/23 1141   ipratropium-albuterol  (DUONEB) 0.5-2.5 (3) MG/3ML nebulizer solution 3 mL  3 mL Nebulization Q6H PRN Pabon, Diego F, MD       iron  sucrose (VENOFER ) 200 mg in sodium chloride  0.9 % 100 mL IVPB  200 mg Intravenous Weekly Pabon, Diego F, MD 440 mL/hr at 11/24/23 1629 200 mg at 11/24/23 1629   latanoprost  (XALATAN ) 0.005 % ophthalmic solution 1 drop  1 drop Both Eyes QHS Pabon, Diego F, MD   1 drop at 11/25/23 2115   loperamide  (IMODIUM ) capsule 4 mg  4 mg Oral Q6H Pabon, Diego F, MD   4 mg at 11/26/23 1141   methocarbamol  (ROBAXIN ) tablet 500 mg  500 mg Oral Q8H PRN Pabon, [place] Catalina F, MD   500 mg at 11/24/23 1128   Or   methocarbamol  (ROBAXIN ) injection 500 mg  500 mg Intravenous Q8H PRN Pabon, Diego F, MD       morphine  (PF) 2 MG/ML injection 2 mg  2 mg Intravenous Q3H PRN Pabon, Diego F, MD   2 mg at 11/14/23 2956   octreotide  (SANDOSTATIN ) injection 200 mcg  200 mcg Subcutaneous Q8H Pabon, Diego F, MD   200 mcg at 11/26/23 2130   ondansetron  (ZOFRAN ) injection 4  mg  4 mg Intravenous Q6H PRN Pabon, Marcial Setting, MD       Oral care mouth rinse  15 mL Mouth Rinse PRN Pabon, Diego F, MD       oxyCODONE  (Oxy IR/ROXICODONE ) immediate release tablet 5-10 mg  5-10 mg Oral Q4H PRN Pabon, Diego F, MD   10 mg at 11/24/23 1127   pantoprazole  (PROTONIX ) injection 40  mg  40 mg Intravenous Q12H Pabon, [place] F, MD   40 mg at 11/26/23 4332   prochlorperazine  (COMPAZINE ) tablet 10 mg  10 mg Oral Q6H PRN Pabon, Diego F, MD       Or   prochlorperazine  (COMPAZINE ) injection 5-10 mg  5-10 mg Intravenous Q6H PRN Pabon, Diego F, MD       sodium chloride  flush (NS) 0.9 % injection 5 mL  5 mL Intracatheter Q8H Myrlene Asper, DO   5 mL at 11/26/23 0526   TPN ADULT (ION)   Intravenous Continuous TPN Merrill, Kristin A, RPH 65 mL/hr at 11/26/23 9518 Infusion Verify at 11/26/23 8416   TPN ADULT (ION)   Intravenous Continuous TPN Adalberto Acton, [facility]         Discharge Medications: Please see discharge summary for a list of discharge medications.  Relevant Imaging Results:  Relevant Lab Results:   Additional Information ss [id number]  Loman Risk, RN

## 2023-11-27 DIAGNOSIS — C189 Malignant neoplasm of colon, unspecified: Secondary | ICD-10-CM | POA: Diagnosis not present

## 2023-11-27 LAB — GLUCOSE, CAPILLARY
Glucose-Capillary: 145 mg/dL — ABNORMAL HIGH (ref 70–99)
Glucose-Capillary: 148 mg/dL — ABNORMAL HIGH (ref 70–99)
Glucose-Capillary: 158 mg/dL — ABNORMAL HIGH (ref 70–99)
Glucose-Capillary: 160 mg/dL — ABNORMAL HIGH (ref 70–99)
Glucose-Capillary: 163 mg/dL — ABNORMAL HIGH (ref 70–99)

## 2023-11-27 LAB — BASIC METABOLIC PANEL WITH GFR
Anion gap: 11 (ref 5–15)
BUN: 20 mg/dL (ref 8–23)
CO2: 25 mmol/L (ref 22–32)
Calcium: 8.3 mg/dL — ABNORMAL LOW (ref 8.9–10.3)
Chloride: 100 mmol/L (ref 98–111)
Creatinine, Ser: 0.54 mg/dL (ref 0.44–1.00)
GFR, Estimated: >60 mL/min (ref >60)
Glucose, Bld: 146 mg/dL — ABNORMAL HIGH (ref 70–99)
Potassium: 4.6 mmol/L (ref 3.5–5.1)
Sodium: 136 mmol/L (ref 135–145)

## 2023-11-27 MED ORDER — TRACE MINERALS CU-MN-SE-ZN 300-55-60-3000 MCG/ML IV SOLN
INTRAVENOUS | Status: AC
  Filled 2023-11-27: qty 582.4

## 2023-11-27 NOTE — Plan of Care (Signed)

## 2023-11-27 NOTE — Progress Notes (Signed)
 Subjective: Better spirits today, afebrile. No abd pain Drains slowing down, ostomy working Drains 90cc  Objective: Vital signs in last 24 hours: Temp:  [98.2 F (36.8 C)-99 F (37.2 C)] 98.9 F (37.2 C) (05/09 0722) Pulse Rate:  [79-83] 83 (05/09 0722) Resp:  [16-20] 16 (05/09 0722) BP: (127-134)/(56-62) 131/56 (05/09 0722) SpO2:  [91 %-96 %] 96 % (05/09 0722) Last BM Date : 11/26/23  Intake/Output from previous day: 05/08 0701 - 05/09 0700 In: 674.9 [I.V.:674.9] Out: 1040 [Urine:900; Drains:90; Stool:50] Intake/Output this shift: Total I/O In: -  Out: 300 [Urine:300]  Physical exam:  Physical exam:  Debilitated Abd: soft, bilious drainage, now Left side seems to be more active. Drain holding suction. No peritonitis. Surgical drains are the ones controlling fistula, IR drain not doing much. Ostomy with good liquid output   Lab Results: CBC  Recent Labs    11/25/23 0607  WBC 22.9*  HGB 7.8*  HCT 23.1*  PLT 653*   BMET Recent Labs    11/26/23 0453 11/27/23 0523  NA 135 136  K 4.3 4.6  CL 99 100  CO2 27 25  GLUCOSE 140* 146*  BUN 22 20  CREATININE 0.67 0.54  CALCIUM 8.3* 8.3*   PT/INR No results for input(s): "LABPROT", "INR" in the last 72 hours. ABG No results for input(s): "PHART", "HCO3" in the last 72 hours.  Invalid input(s): "PCO2", "PO2"  Studies/Results: CT ABDOMEN PELVIS W CONTRAST Addendum Date: 11/25/2023 ADDENDUM REPORT: 11/25/2023 16:30 ADDENDUM: These results were called by telephone at the time of interpretation on 11/25/2023 at 4:30 pm to provider Teegan Guinther , who verbally acknowledged these results. Electronically Signed   By: Angus Bark M.D.   On: 11/25/2023 16:30   Result Date: 11/25/2023 CLINICAL DATA:  History of enterocutaneous fistula and fluid collections. EXAM: CT ABDOMEN AND PELVIS WITH CONTRAST TECHNIQUE: Multidetector CT imaging of the abdomen and pelvis was performed using the standard protocol following bolus  administration of intravenous contrast. RADIATION DOSE REDUCTION: This exam was performed according to the departmental dose-optimization program which includes automated exposure control, adjustment of the mA and/or kV according to patient size and/or use of iterative reconstruction technique. CONTRAST:  OMNIPAQUE  IOHEXOL  300 MG/ML  SOLN COMPARISON:  CT abdomen pelvis dated 11/18/2023. FINDINGS: Lower chest: Small bilateral pleural effusions, significantly decreased since the prior CT. Bibasilar subsegmental atelectasis or pneumonia. Hepatobiliary: Small cyst in the dome of the liver. No biliary dilatation. The gallbladder is unremarkable. Pancreas: Unremarkable. No pancreatic ductal dilatation or surrounding inflammatory changes. Spleen: Normal in size without focal abnormality. Adrenals/Urinary Tract: The adrenal glands are unremarkable. There is no hydronephrosis on either side. There is symmetric enhancement and excretion of contrast by both kidneys. The visualized ureters and urinary bladder appear unremarkable. Stomach/Bowel: There is a moderate size hiatal hernia. Postsurgical changes of bowel with a right lower quadrant ostomy. No bowel obstruction. Vascular/Lymphatic: Moderate aortoiliac atherosclerotic disease. The IVC is unremarkable. No portal venous gas. No adenopathy. Reproductive: Hysterectomy. Other: Interval placement of a left anterior pigtail catheter with tip in the collection adjacent to ostomy. Two additional peritoneal drainage catheters with tips in similar position as the prior CT. There is a large collection in the anterior peritoneum containing air and contrast. In the absence of injected contrast through the catheters findings consistent with bowel perforation with a moderate-sized pneumoperitoneum and large amount of extraluminal contrast. Extraluminal fecalized content was noted in the pelvis on the prior CT. There is a loculated collection adjacent to the  hiatal hernia  containing air and fluid and measuring approximately 3.1 x 6.5 cm. Multiloculated collection with pockets of air in the upper abdomen has slightly decreased in size status post drainage catheter placement. Additional loculated collection in the left anterior abdomen abutting the left lobe of the liver measures approximately 2.7 x 7.0 cm and increased in size since the prior CT (previously approximately 2.0 x 4.1 cm). Musculoskeletal: Osteopenia with degenerative changes. No acute osseous pathology. Old T12 compression fracture with near complete loss of vertebral body height and anterior wedging. IMPRESSION: 1. Large intraperitoneal collection in the anterior pelvis containing air and contrast most consistent with perforation likely arising from the colostomy. Surgical consult is advised. 2. Additional loculated collections/abscesses as above. 3. Small bilateral pleural effusions, decreased since the prior CT. Bibasilar subsegmental atelectasis or pneumonia. 4.  Aortic Atherosclerosis (ICD10-I70.0). Electronically Signed: By: Angus Bark M.D. On: 11/25/2023 16:03    Anti-infectives: Anti-infectives (From admission, onward)    Start     Dose/Rate Route Frequency Ordered Stop   11/18/23 1015  fluconazole  (DIFLUCAN ) IVPB 200 mg        200 mg 100 mL/hr over 60 Minutes Intravenous Every 24 hours 11/18/23 0930     11/13/23 1700  Ampicillin -Sulbactam (UNASYN ) 3 g in sodium chloride  0.9 % 100 mL IVPB        3 g 200 mL/hr over 30 Minutes Intravenous Every 6 hours 11/13/23 1555 11/27/23 1759   11/13/23 0600  cefoTEtan  (CEFOTAN ) 2 g in sodium chloride  0.9 % 100 mL IVPB        2 g 200 mL/hr over 30 Minutes Intravenous On call to O.R. 11/13/23 0043 11/13/23 0954       Assessment/Plan:  Doing well and responding to current Rx keep npo Labs in am Reassess need A/bs base on wbc and clinical picture  Evelia Hipp, MD, FACS  11/27/2023

## 2023-11-27 NOTE — Progress Notes (Signed)
 Physical Therapy Treatment Patient Details Name: Kristin Miller MRN: [id number] DOB: 05-Oct-1935 Today's Date: 11/27/2023   History of Present Illness Kristin Miller presented to [facility] on 4/25 for planned surgery, with the diagnosis of colostomy complication; now s/p revision. Other dx include back pain, glaucoma, HTN, COPD.    PT Comments  Pt was pleasant and motivated to participate during the session and put forth good effort throughout. Pt required min A during rolling and sidelying to sit during log roll training and once in sitting position presented with good stability.  Pt was able to stand with cues for sequencing but without physical assistance and was able to take several steps near the EOB and from bed to chair before becoming fatigued and requesting to return to sitting.  Pt reported no adverse symptoms during the session with SpO2 >/= 91% and HR WNL throughout on room air.  Pt will benefit from continued PT services upon discharge to safely address deficits listed in patient problem list for decreased caregiver assistance and eventual return to PLOF.       If plan is discharge home, recommend the following: A lot of help with walking and/or transfers;A lot of help with bathing/dressing/bathroom;Help with stairs or ramp for entrance;Assist for transportation;Assistance with cooking/housework   Can travel by private vehicle     No  Equipment Recommendations  Rolling walker (2 wheels)    Recommendations for Other Services       Precautions / Restrictions Precautions Precautions: Fall Recall of Precautions/Restrictions: Intact Restrictions Weight Bearing Restrictions Per Provider Order: No Other Position/Activity Restrictions: Multiple drains     Mobility  Bed Mobility Overal bed mobility: Needs Assistance Bed Mobility: Rolling, Sidelying to Sit Rolling: Min assist Sidelying to sit: Min assist       General bed mobility comments: Log roll training for decreased  caregiver assistance and for pain control; min A for BLE and trunk control    Transfers Overall transfer level: Needs assistance Equipment used: Rolling walker (2 wheels) Transfers: Sit to/from Stand Sit to Stand: Contact guard assist           General transfer comment: Min verbal cues for hand placement    Ambulation/Gait Ambulation/Gait assistance: Contact guard assist Gait Distance (Feet): 5 Feet Assistive device: Rolling walker (2 wheels) Gait Pattern/deviations: Step-through pattern, Decreased step length - right, Decreased step length - left Gait velocity: decreased     General Gait Details: Pt able to take several small steps at the EOB and from bed to chair before fatiguing and requesting to return to sitting   Stairs             Wheelchair Mobility     Tilt Bed    Modified Rankin (Stroke Patients Only)       Balance Overall balance assessment: Needs assistance Sitting-balance support: Feet supported Sitting balance-Leahy Scale: Good     Standing balance support: Bilateral upper extremity supported, Reliant on assistive device for balance, During functional activity, Single extremity supported Standing balance-Leahy Scale: Good                              Communication Communication Communication: No apparent difficulties  Cognition Arousal: Alert Behavior During Therapy: WFL for tasks assessed/performed   PT - Cognitive impairments: No apparent impairments                         Following commands:  Intact      Cueing Cueing Techniques: Verbal cues, Tactile cues  Exercises  Log roll training and review for decreased caregiver assistance and decreased abdominal pain with bed mobility tasks   Static sitting at EOB x 8 min for gentle core strengthening and improved activity tolerance  Static standing with RW x 3 min with combination of BUE and SUE support for improved standing balance and activity tolerance     General Comments        Pertinent Vitals/Pain Pain Assessment Pain Assessment: No/denies pain    Home Living                          Prior Function            PT Goals (current goals can now be found in the care plan section) Progress towards PT goals: Progressing toward goals    Frequency    Min 2X/week      PT Plan      Co-evaluation              AM-PAC PT "6 Clicks" Mobility   Outcome Measure  Help needed turning from your back to your side while in a flat bed without using bedrails?: A Little Help needed moving from lying on your back to sitting on the side of a flat bed without using bedrails?: A Little Help needed moving to and from a bed to a chair (including a wheelchair)?: A Little Help needed standing up from a chair using your arms (e.g., wheelchair or bedside chair)?: A Little Help needed to walk in hospital room?: A Lot Help needed climbing 3-5 steps with a railing? : Total 6 Click Score: 15    End of Session Equipment Utilized During Treatment: Gait belt;Other (comment) (Gait belt placed high to avoid drains) Activity Tolerance: Patient tolerated treatment well Patient left: in chair;with call bell/phone within reach;with chair alarm set;with family/visitor present;with nursing/sitter in room Nurse Communication: Mobility status PT Visit Diagnosis: Muscle weakness (generalized) (M62.81);Difficulty in walking, not elsewhere classified (R26.2);Unsteadiness on feet (R26.81)     Time: 4098-1191 PT Time Calculation (min) (ACUTE ONLY): 33 min  Charges:    $Therapeutic Activity: 23-37 mins PT General Charges $$ ACUTE PT VISIT: 1 Visit                     D. Scott Breahna Boylen PT, DPT 11/27/23, 3:45 PM

## 2023-11-27 NOTE — TOC Progression Note (Signed)
 Transition of Care [facility]) - Progression Note    Patient Details  Name: Kristin Miller MRN: [id number] Date of Birth: [birth year]  [facility] CM/SW Contact  Loman Risk, RN Phone Number: 11/27/2023, 2:27 PM  Clinical Narrative:     Patient remains on TPN No bed offers at this time.  TOC to resend out referral when closer to medically ready for discharge        Expected Discharge Plan and Services                                               Social Determinants of Health (SDOH) Interventions SDOH Screenings   Food Insecurity: No Food Insecurity (11/14/2023)  Housing: Low Risk  (11/24/2023)  Transportation Needs: No Transportation Needs (11/14/2023)  Utilities: Not At Risk (11/14/2023)  Alcohol Screen: Low Risk  (09/25/2021)  Depression (PHQ2-9): Low Risk  (08/25/2023)  Financial Resource Strain: Low Risk  (01/09/2023)   Received from [facility], [facility]  Physical Activity: Inactive (09/25/2021)  Social Connections: Moderately Integrated (11/14/2023)  Stress: No Stress Concern Present (09/25/2021)  Tobacco Use: Low Risk  (11/13/2023)    Readmission Risk Interventions    11/20/2023    8:57 AM  Readmission Risk Prevention Plan  Transportation Screening Complete  PCP or Specialist Appt within 5-7 Days Complete  Medication Review (RN CM) Complete

## 2023-11-27 NOTE — Plan of Care (Signed)

## 2023-11-27 NOTE — Consult Note (Addendum)
 WOC Nurse ostomy consult note Stoma type/location: End colostomy RLQ Stomal assessment/size: 38 mm round, red and viable, edema. Peristomal assessment: Intact Treatment options for stomal/peristomal skin: Rind 2" Output 80 ml at 0900. Bloody/brown liquid stools Ostomy pouching: 1pc. #[id number] Education provided: Educational instructions and ostomy pouch change. The pt has an ostomy since 2024. Patient had ostomy revision 11/13/2023 by Dr. Dana Duncan.   - Cleanse the skin with saline; - Cut the barrier as the size and shape as the ostomy - Applied ring on the barrier, surrounding the cut. - Applied to the skin, matching with the ostomy place. - Closed the lock in roll system. - Empty the pouch when is 1/3 full.  The pt know the materials used, including the ring.   Enrolled patient in [place] Secure Start DC program: Yes (05/09)  WOC team will follow weekly.   Please reconsult if further assistance is needed. Thank-you,  Rachel Budds BSN, RN, [employer], WOC  (Pager: [phone])

## 2023-11-27 NOTE — Consult Note (Signed)
 Consult Note   Patient: Kristin Miller ONG:[id number] DOB: [birth year] DOA: 11/13/2023     14 DOS: the patient was seen and examined on 11/27/2023   Brief hospital course:  Pt. with PMH of hypertension, colon cancer, adenocarcinoma s/p resection and colostomy, right breast cancer, T-spine fracture, left shoulder BCC, admitted on 11/13/2023, with complaint of leaking of ostomy site patient was admitted by general surgery for ostomy revision, was found to have dyspnea and hypoxia.  So TRH was consulted for further management.    Assessment and Plan:   Acute hypoxic respiratory failure - Initially requiring 2 L nasal cannula.  Likely multifactorial etiology given underlying COPD as well as mild exacerbation of HFpE, atelectasis.  Now resolved  Acute exacerbation of HFpEF - Likely secondary to aggressive volume resuscitation.  Chest x-ray reviewed by prior provider noting congestion with mild pleural effusion.  Will hold on additional lasix  as now appears to be euvolemic   COPD exacerbation Appears resolved   Acute blood loss anemia - Likely secondary to surgical intervention and marrow stunting with hemodilution. Hgb stable around 8   Necrosis of end colostomy   - revision of end colostomy with partial colectomy and resiting of end colostomy with repair of parastomal hernia performed on 4/25. IR placed b/l drains on 4/25. Surgical culture growing candida, is currently on fluconazole  and unasyn . Ostomy has output. Currently on tpn.  Will touch base w/ ID pharm today regarding duration of fluconazole   Adenocarcinoma of the colon - S/p robotic assisted laparoscopic partial colectomy 10/27/23.  Readmitted 4/25 as above   Subjective: mild improvement. No dyspnea. Pain controlled.   Physical Exam: Vitals:   11/27/23 0309 11/27/23 0709 11/27/23 0722 11/27/23 1514  BP: (!) 127/57  (!) 131/56 (!) 157/80  Pulse: 79  83 87  Resp: 20  16 16   Temp: 99 F (37.2 C)  98.9 F (37.2 C) 98.7 F (37.1  C)  TempSrc:      SpO2: 94%  96% 95%  Weight:  61 kg    Height:        GENERAL:  Alert, pleasant, no acute distress  CARDIOVASCULAR:  RRR,  RESPIRATORY:  Clear to auscultation, no wheezing, rales, or rhonchi GASTROINTESTINAL:  Soft, diffusely minimally tender, stoma present, bilateral drains present, anterior surgical incisions, ostomy with dark drainage EXTREMITIES:  No LE edema bilaterally NEURO:  No new focal deficits appreciated SKIN:  No rashes noted PSYCH:  Appropriate mood and affect    Data Reviewed:  No new imaging to review  Labs: CBC: Recent Labs  Lab 11/21/23 0704 11/22/23 0411 11/23/23 0527 11/24/23 0509 11/25/23 0607  WBC 16.4* 21.1* 23.8* 21.3* 22.9*  NEUTROABS 11.0* 15.4* 18.5* 16.2* 17.5*  HGB 7.2* 7.8* 8.3* 7.9* 7.8*  HCT 22.3* 24.2* 25.6* 24.4* 23.1*  MCV 85.1 84.6 84.2 85.6 83.7  PLT 451* 458* 496* 516* 653*   Basic Metabolic Panel: Recent Labs  Lab 11/21/23 0704 11/23/23 0527 11/26/23 0453 11/27/23 0523  NA 138 136 135 136  K 4.2 4.6 4.3 4.6  CL 102 98 99 100  CO2 26 26 27 25   GLUCOSE 150* 188* 140* 146*  BUN 15 16 22 20   CREATININE 0.54 0.52 0.67 0.54  CALCIUM 7.9* 8.5* 8.3* 8.3*  MG 1.9 2.0 2.2  --   PHOS 3.4 4.3 3.7  --    Liver Function Tests: Recent Labs  Lab 11/23/23 0527 11/26/23 0453  AST 15 15  ALT 8 9  ALKPHOS 125 159*  BILITOT  0.4 0.5  PROT 6.1* 6.2*  ALBUMIN  2.0* 1.8*   CBG: Recent Labs  Lab 11/26/23 1128 11/26/23 1652 11/27/23 0022 11/27/23 0621 11/27/23 1212  GLUCAP 173* 140* 145* 160* 148*    Scheduled Meds:  acetaminophen   1,000 mg Oral Q6H   alteplase   2 mg Intracatheter Once   amLODipine   5 mg Oral Daily   arformoterol   15 mcg Nebulization BID   budesonide  (PULMICORT ) nebulizer solution  0.25 mg Nebulization BID   Chlorhexidine  Gluconate Cloth  6 each Topical Daily   diphenoxylate -atropine   1 tablet Oral QID   enoxaparin  (LOVENOX ) injection  40 mg Subcutaneous Q24H   insulin  aspart  0-9  Units Subcutaneous Q6H   latanoprost   1 drop Both Eyes QHS   loperamide   4 mg Oral Q6H   octreotide   200 mcg Subcutaneous Q8H   pantoprazole  (PROTONIX ) IV  40 mg Intravenous Q12H   sodium chloride  flush  10-40 mL Intracatheter Q12H   sodium chloride  flush  5 mL Intracatheter Q8H   Continuous Infusions:  fluconazole  (DIFLUCAN ) IV 200 mg (11/27/23 0907)   iron  sucrose 200 mg (11/24/23 1629)   TPN ADULT (ION) 65 mL/hr at 11/26/23 1725   TPN ADULT (ION)     PRN Meds:.diphenhydrAMINE  **OR** diphenhydrAMINE , hydrALAZINE , ipratropium-albuterol , methocarbamol  **OR** methocarbamol  (ROBAXIN ) injection, morphine  injection, ondansetron  (ZOFRAN ) IV, mouth rinse, oxyCODONE , prochlorperazine  **OR** prochlorperazine , sodium chloride  flush   Disposition: Per primary team     Author: Raymonde Calico, MD 11/27/2023 3:18 PM  For on call review [email or web address].

## 2023-11-27 NOTE — Consult Note (Signed)
 PHARMACY - TOTAL PARENTERAL NUTRITION CONSULT NOTE   Indication: Prolonged ileus  Patient Measurements: Height: 5' (152.4 cm) Weight: 58.2 kg (128 lb 4.9 oz) IBW/kg (Calculated) : 45.5 TPN AdjBW (KG): 51.8 Body mass index is 25.06 kg/m. Usual Weight: 64.9  Assessment:  NE is a 88 yo female who presented with colostomy issues w separation of mucocutaneous junction and distal necrosis post parietal colectomy due to adenocarcinoma of colon. Previously colectomy was 4/8. They underwent revision of colostomy on 4/25. Pharmacy has been consulted to manage this patient's TPN.   Glucose / Insulin : BG 140-173 6 units of novolog  in the last 24 hours,  sSSI q6h.  Electrolytes: WNL Renal: Scr < 1 Hepatic: WNL Intake / Output; net (-) MIVF: No MIVF at this time  Intake/Output Summary (Last 24 hours) at 11/27/2023 0724 Last data filed at 11/26/2023 2154 Gross per 24 hour  Intake 674.85 ml  Output 1040 ml  Net -365.15 ml    GI Imaging: 4/23: CT Abdomen Pelvis 1. Surgical changes from a left colectomy and left lower colostomy. The distal colostomy is abnormal. Beginning approximately 10 cm from the ostomy site the colon is very amorphous and is filled with fluid. There are abnormal gas collections around the bowel and there is a large collection of gas in the subcutaneous tissues. Findings very worrisome for necrosis of the distal 10 cm of colon. 4/30 CT abd/pel: --Multiple fluid collections are noted throughout the peritoneal space, concerning for possible abscesses.  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, complicated by pertinent comorbid conditions   --2 new drains placed yesterday 5/2 by interventional radiology in 2 new right-sided fluid collections   Central access: 11/17/23 TPN start date:  11/17/23  Nutritional Goals: Goal TPN rate is 65 mL/hr (provides 87 g of protein and 1666 kcals per day)  RD Assessment: Estimated Needs Total Energy Estimated Needs: 1600-1800kcal/day Total Protein Estimated Needs: 80-90g/day Total Fluid Estimated Needs: 1.4-1.6L/day  Current Nutrition:  NPO  Plan:  ---Continue custom TPN at goal of 65 mL/hr  --- Electrolytes in TPN: Na 67mEq/L, K 20mEq/L, Ca 42mEq/L, Mg 5 mEq/L, and Phos 15. Cl:Ac 1:1 ---Add standard MVI and trace elements to TPN ---Continue Sensitive q6h SSI and adjust as needed  ---chromium 10 mcg/day per dietary recommendations:  unable to provide at this time due to national backorder status, removed from TPN 11/24/23 ---Monitor TPN labs on Mon/Thurs  Adalberto Acton, PharmD 11/27/2023,7:24 AM

## 2023-11-27 NOTE — Consult Note (Signed)
 WOC team consulted for colostomy.  Patient known to ostomy team from previous colostomy (last seen in person 10/29/2023).  Patient had ostomy revision 11/13/2023 by Dr. Dana Duncan.    WOC team will assess stoma for best ostomy pouching system post revision.   Thank you,    Ronni Colace MSN, RN-BC, [employer] [phone]

## 2023-11-28 DIAGNOSIS — C189 Malignant neoplasm of colon, unspecified: Secondary | ICD-10-CM | POA: Diagnosis not present

## 2023-11-28 LAB — CBC
HCT: 21.8 % — ABNORMAL LOW (ref 36.0–46.0)
Hemoglobin: 7 g/dL — ABNORMAL LOW (ref 12.0–15.0)
MCH: 28 pg (ref 26.0–34.0)
MCHC: 32.1 g/dL (ref 30.0–36.0)
MCV: 87.2 fL (ref 80.0–100.0)
Platelets: 865 10*3/uL — ABNORMAL HIGH (ref 150–400)
RBC: 2.5 MIL/uL — ABNORMAL LOW (ref 3.87–5.11)
RDW: 16.8 % — ABNORMAL HIGH (ref 11.5–15.5)
WBC: 19.2 10*3/uL — ABNORMAL HIGH (ref 4.0–10.5)
nRBC: 0 % (ref 0.0–0.2)

## 2023-11-28 LAB — BASIC METABOLIC PANEL WITH GFR
Anion gap: 11 (ref 5–15)
BUN: 20 mg/dL (ref 8–23)
CO2: 25 mmol/L (ref 22–32)
Calcium: 8.3 mg/dL — ABNORMAL LOW (ref 8.9–10.3)
Chloride: 101 mmol/L (ref 98–111)
Creatinine, Ser: 0.5 mg/dL (ref 0.44–1.00)
GFR, Estimated: >60 mL/min (ref >60)
Glucose, Bld: 149 mg/dL — ABNORMAL HIGH (ref 70–99)
Potassium: 4.5 mmol/L (ref 3.5–5.1)
Sodium: 137 mmol/L (ref 135–145)

## 2023-11-28 LAB — GLUCOSE, CAPILLARY
Glucose-Capillary: 150 mg/dL — ABNORMAL HIGH (ref 70–99)
Glucose-Capillary: 156 mg/dL — ABNORMAL HIGH (ref 70–99)
Glucose-Capillary: 146 mg/dL — ABNORMAL HIGH (ref 70–99)
Glucose-Capillary: 151 mg/dL — ABNORMAL HIGH (ref 70–99)
Glucose-Capillary: 128 mg/dL — ABNORMAL HIGH (ref 70–99)
Glucose-Capillary: 161 mg/dL — ABNORMAL HIGH (ref 70–99)

## 2023-11-28 MED ORDER — TRACE MINERALS CU-MN-SE-ZN 300-55-60-3000 MCG/ML IV SOLN
INTRAVENOUS | Status: AC
  Filled 2023-11-28: qty 582.4

## 2023-11-28 MED ORDER — OCTREOTIDE ACETATE 100 MCG/ML IJ SOLN
200.0000 ug | Freq: Three times a day (TID) | INTRAMUSCULAR | Status: DC
  Administered 2023-11-28 – 2023-12-10 (×35): 200 ug via INTRAVENOUS
  Filled 2023-11-28 (×39): qty 2

## 2023-11-28 NOTE — Consult Note (Signed)
 PHARMACY - TOTAL PARENTERAL NUTRITION CONSULT NOTE   Indication: Prolonged ileus  Patient Measurements: Height: 5' (152.4 cm) Weight: 57.1 kg (125 lb 14.1 oz) IBW/kg (Calculated) : 45.5 TPN AdjBW (KG): 51.8 Body mass index is 24.58 kg/m. Usual Weight: 64.9  Assessment:  NE is a 88 yo female who presented with colostomy issues w separation of mucocutaneous junction and distal necrosis post parietal colectomy due to adenocarcinoma of colon. Previously colectomy was 4/8. They underwent revision of colostomy on 4/25. Pharmacy has been consulted to manage this patient's TPN.   Glucose / Insulin : BG 140-173 6 units of novolog  in the last 24 hours,  sSSI q6h.  Electrolytes: WNL Renal: Scr < 1 Hepatic: WNL Intake / Output; net (-) MIVF: No MIVF at this time  Intake/Output Summary (Last 24 hours) at 11/28/2023 1106 Last data filed at 11/28/2023 0800 Gross per 24 hour  Intake 1209.64 ml  Output 1485 ml  Net -275.36 ml    GI Imaging: 4/23: CT Abdomen Pelvis 1. Surgical changes from a left colectomy and left lower colostomy. The distal colostomy is abnormal. Beginning approximately 10 cm from the ostomy site the colon is very amorphous and is filled with fluid. There are abnormal gas collections around the bowel and there is a large collection of gas in the subcutaneous tissues. Findings very worrisome for necrosis of the distal 10 cm of colon. 4/30 CT abd/pel: --Multiple fluid collections are noted throughout the peritoneal space, concerning for possible abscesses.  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, complicated by pertinent comorbid conditions   --2 new drains placed yesterday 5/2 by interventional radiology in 2 new right-sided fluid collections   Central access: 11/17/23 TPN start date:  11/17/23  Nutritional Goals: Goal TPN rate is 65 mL/hr (provides 87 g of protein and 1666 kcals per day)  RD Assessment: Estimated Needs Total Energy Estimated Needs: 1600-1800kcal/day Total Protein Estimated Needs: 80-90g/day Total Fluid Estimated Needs: 1.4-1.6L/day  Current Nutrition:  NPO  Plan:  ---Continue custom TPN at goal of 65 mL/hr  --- Electrolytes in TPN: Na 38mEq/L, K 22mEq/L, Ca 33mEq/L, Mg 5 mEq/L, and Phos 15. Cl:Ac 1:1 ---Add standard MVI and trace elements to TPN ---Continue Sensitive q6h SSI and adjust as needed  ---chromium 10 mcg/day per dietary recommendations:  unable to provide at this time due to national backorder status, removed from TPN 11/24/23 ---Monitor TPN labs on Mon/Thurs  Kohana Amble A Jalie Eiland, PharmD 11/28/2023,11:06 AM

## 2023-11-28 NOTE — Progress Notes (Signed)
 CC: fistula Subjective: Better spirits today, afebrile. No abd pain Drains slowing down, ostomy working Drains 235cc Wbc trending down, afebrile C/o tenderness at somatostatin site Objective: Vital signs in last 24 hours: Temp:  [98.1 F (36.7 C)-98.7 F (37.1 C)] 98.4 F (36.9 C) (05/10 0731) Pulse Rate:  [77-87] 77 (05/10 0731) Resp:  [16-20] 16 (05/10 0731) BP: (131-157)/(59-80) 143/61 (05/10 0731) SpO2:  [92 %-95 %] 94 % (05/10 0731) Weight:  [57.1 kg] 57.1 kg (05/10 0406) Last BM Date : 11/27/23  Intake/Output from previous day: 05/09 0701 - 05/10 0700 In: 1209.6 [P.O.:120; I.V.:769.6; IV Piggyback:300] Out: 1435 [Urine:1200; Drains:235] Intake/Output this shift: Total I/O In: -  Out: 350 [Urine:350]  Physical exam:  Debilitated Abd: soft, bilious drainage, now Left side seems to be more active. Drain holding suction. No peritonitis. Surgical drains are the ones controlling fistula, IR drain not doing much. Ostomy with good liquid output   Lab Results: CBC  Recent Labs    11/28/23 0549  WBC 19.2*  HGB 7.0*  HCT 21.8*  PLT 865*   BMET Recent Labs    11/27/23 0523 11/28/23 0549  NA 136 137  K 4.6 4.5  CL 100 101  CO2 25 25  GLUCOSE 146* 149*  BUN 20 20  CREATININE 0.54 0.50  CALCIUM 8.3* 8.3*   PT/INR No results for input(s): "LABPROT", "INR" in the last 72 hours. ABG No results for input(s): "PHART", "HCO3" in the last 72 hours.  Invalid input(s): "PCO2", "PO2"  Studies/Results: No results found.  Anti-infectives: Anti-infectives (From admission, onward)    Start     Dose/Rate Route Frequency Ordered Stop   11/18/23 1015  fluconazole  (DIFLUCAN ) IVPB 200 mg        200 mg 100 mL/hr over 60 Minutes Intravenous Every 24 hours 11/18/23 0930     11/13/23 1700  Ampicillin -Sulbactam (UNASYN ) 3 g in sodium chloride  0.9 % 100 mL IVPB        3 g 200 mL/hr over 30 Minutes Intravenous Every 6 hours 11/13/23 1555 11/27/23 1239   11/13/23 0600   cefoTEtan  (CEFOTAN ) 2 g in sodium chloride  0.9 % 100 mL IVPB        2 g 200 mL/hr over 30 Minutes Intravenous On call to O.R. 11/13/23 0043 11/13/23 0954       Assessment/Plan:   Doing well and responding to current Rx keep npo Labs in am She completed antibiotics Reassess need further  A/bs base on wbc and clinical picture  Evelia Hipp, MD, FACS  11/28/2023

## 2023-11-28 NOTE — Consult Note (Signed)
 Consult Note   Patient: Kristin Miller ZOX:[id number] DOB: 03/19/1936 DOA: 11/13/2023     15 DOS: the patient was seen and examined on 11/28/2023   Brief hospital course:  Pt. with PMH of hypertension, colon cancer, adenocarcinoma s/p resection and colostomy, right breast cancer, T-spine fracture, left shoulder BCC, admitted on 11/13/2023, with complaint of leaking of ostomy site patient was admitted by general surgery for ostomy revision, was found to have dyspnea and hypoxia.  So TR[facility] was consulted for further management.    Assessment and Plan:   Acute hypoxic respiratory failure - Initially requiring 2 L nasal cannula.  Likely multifactorial etiology given underlying COPD as well as mild exacerbation of HFpE, atelectasis.  Now resolved  Acute exacerbation of HFpEF - Likely secondary to aggressive volume resuscitation.  Chest x-ray reviewed by prior provider noting congestion with mild pleural effusion.  Will hold on additional lasix  as now appears to be euvolemic   COPD exacerbation Appears resolved   Acute blood loss anemia - Likely secondary to surgical intervention and marrow stunting with hemodilution. Hgb has drifted to 7 today, no signs blood loss. Would transfuse tomorrow if hgb drops below 7   Necrosis of end colostomy   - revision of end colostomy with partial colectomy and resiting of end colostomy with repair of parastomal hernia performed on 4/25. IR placed b/l drains on 4/25. Surgical culture growing candida, is currently on fluconazole  and unasyn . Ostomy has output. Currently on tpn.  As we don't yet have source control will need to continue fluconazole  for the time being. Off abx today. On TPN.  Adenocarcinoma of the colon - S/p robotic assisted laparoscopic partial colectomy 10/27/23.  Readmitted 4/25 as above   Subjective: stable, no sig pain when doesn't move.   Physical Exam: Vitals:   11/27/23 2009 11/27/23 2026 11/28/23 0406 11/28/23 0731  BP: (!) 150/61  (!)  131/59 (!) 143/61  Pulse: 79  80 77  Resp: 20  20 16   Temp: 98.6 F (37 C)  98.1 F (36.7 C) 98.4 F (36.9 C)  TempSrc: Oral  Oral   SpO2: 94% 92% 93% 94%  Weight:   57.1 kg   Height:        GENERAL:  Alert, pleasant, no acute distress  CARDIOVASCULAR:  RRR,  RESPIRATORY:  Clear to auscultation, no wheezing, rales, or rhonchi GASTROINTESTINAL:  Soft, diffusely minimally tender, stoma present, bilateral drains present, anterior surgical incisions, ostomy with brown drainage EXTREMITIES:  No LE edema bilaterally NEURO:  No new focal deficits appreciated SKIN:  No rashes noted PSYCH:  Appropriate mood and affect    Data Reviewed:  No new imaging to review  Labs: CBC: Recent Labs  Lab 11/22/23 0411 11/23/23 0527 11/24/23 0509 11/25/23 0607 11/28/23 0549  WBC 21.1* 23.8* 21.3* 22.9* 19.2*  NEUTROABS 15.4* 18.5* 16.2* 17.5*  --   HGB 7.8* 8.3* 7.9* 7.8* 7.0*  HCT 24.2* 25.6* 24.4* 23.1* 21.8*  MCV 84.6 84.2 85.6 83.7 87.2  PLT 458* 496* 516* 653* 865*   Basic Metabolic Panel: Recent Labs  Lab 11/23/23 0527 11/26/23 0453 11/27/23 0523 11/28/23 0549  NA 136 135 136 137  K 4.6 4.3 4.6 4.5  CL 98 99 100 101  CO2 26 27 25 25   GLUCOSE 188* 140* 146* 149*  BUN 16 22 20 20   CREATININE 0.52 0.67 0.54 0.50  CALCIUM 8.5* 8.3* 8.3* 8.3*  MG 2.0 2.2  --   --   PHOS 4.3 3.7  --   --  Liver Function Tests: Recent Labs  Lab 11/23/23 0527 11/26/23 0453  AST 15 15  ALT 8 9  ALKPHOS 125 159*  BILITOT 0.4 0.5  PROT 6.1* 6.2*  ALBUMIN  2.0* 1.8*   CBG: Recent Labs  Lab 11/27/23 1705 11/27/23 2354 11/28/23 0518 11/28/23 0819 11/28/23 1157  GLUCAP 163* 158* 150* 156* 146*    Scheduled Meds:  acetaminophen   1,000 mg Oral Q6H   alteplase   2 mg Intracatheter Once   amLODipine   5 mg Oral Daily   arformoterol   15 mcg Nebulization BID   budesonide  (PULMICORT ) nebulizer solution  0.25 mg Nebulization BID   Chlorhexidine  Gluconate Cloth  6 each Topical Daily    diphenoxylate -atropine   1 tablet Oral QID   enoxaparin  (LOVENOX ) injection  40 mg Subcutaneous Q24H   insulin  aspart  0-9 Units Subcutaneous Q6H   latanoprost   1 drop Both Eyes QHS   loperamide   4 mg Oral Q6H   octreotide   200 mcg Intravenous Q8H   pantoprazole  (PROTONIX ) IV  40 mg Intravenous Q12H   sodium chloride  flush  10-40 mL Intracatheter Q12H   sodium chloride  flush  5 mL Intracatheter Q8H   Continuous Infusions:  fluconazole  (DIFLUCAN ) IV 200 mg (11/28/23 1303)   iron  sucrose 440 mL/hr at 11/27/23 1900   TPN ADULT (ION) 65 mL/hr at 11/28/23 0500   TPN ADULT (ION)     PRN Meds:.diphenhydrAMINE  **OR** diphenhydrAMINE , hydrALAZINE , ipratropium-albuterol , methocarbamol  **OR** methocarbamol  (ROBAXIN ) injection, morphine  injection, ondansetron  (ZOFRAN ) IV, mouth rinse, oxyCODONE , prochlorperazine  **OR** prochlorperazine , sodium chloride  flush   Disposition: Per primary team     Author: Raymonde Calico, MD 11/28/2023 1:17 PM  For on call review [email or web address].

## 2023-11-28 NOTE — Plan of Care (Signed)

## 2023-11-29 DIAGNOSIS — C189 Malignant neoplasm of colon, unspecified: Secondary | ICD-10-CM | POA: Diagnosis not present

## 2023-11-29 LAB — GLUCOSE, CAPILLARY
Glucose-Capillary: 144 mg/dL — ABNORMAL HIGH (ref 70–99)
Glucose-Capillary: 149 mg/dL — ABNORMAL HIGH (ref 70–99)
Glucose-Capillary: 150 mg/dL — ABNORMAL HIGH (ref 70–99)
Glucose-Capillary: 150 mg/dL — ABNORMAL HIGH (ref 70–99)
Glucose-Capillary: 164 mg/dL — ABNORMAL HIGH (ref 70–99)

## 2023-11-29 LAB — BASIC METABOLIC PANEL WITH GFR
Anion gap: 7 (ref 5–15)
BUN: 20 mg/dL (ref 8–23)
CO2: 27 mmol/L (ref 22–32)
Calcium: 8.4 mg/dL — ABNORMAL LOW (ref 8.9–10.3)
Chloride: 101 mmol/L (ref 98–111)
Creatinine, Ser: 0.54 mg/dL (ref 0.44–1.00)
GFR, Estimated: >60 mL/min (ref >60)
Glucose, Bld: 151 mg/dL — ABNORMAL HIGH (ref 70–99)
Potassium: 4.6 mmol/L (ref 3.5–5.1)
Sodium: 135 mmol/L (ref 135–145)

## 2023-11-29 MED ORDER — TRACE MINERALS CU-MN-SE-ZN 300-55-60-3000 MCG/ML IV SOLN
INTRAVENOUS | Status: AC
  Filled 2023-11-29: qty 582.4

## 2023-11-29 MED ORDER — DIPHENOXYLATE-ATROPINE 2.5-0.025 MG PO TABS
2.0000 | ORAL_TABLET | Freq: Four times a day (QID) | ORAL | Status: DC
  Administered 2023-11-29 – 2023-12-15 (×61): 2 via ORAL
  Filled 2023-11-29 (×64): qty 2

## 2023-11-29 NOTE — Consult Note (Signed)
 Consult Note   Patient: Kristin Miller ZOX:[id number] DOB: September 12, 1935 DOA: 11/13/2023     16 DOS: the patient was seen and examined on 11/29/2023   Brief hospital course:  Pt. with PMH of hypertension, colon cancer, adenocarcinoma s/p resection and colostomy, right breast cancer, T-spine fracture, left shoulder BCC, admitted on 11/13/2023, with complaint of leaking of ostomy site patient was admitted by general surgery for ostomy revision, was found to have dyspnea and hypoxia.  So TRH was consulted for further management.    Assessment and Plan:   Acute hypoxic respiratory failure - Initially requiring 2 L nasal cannula.  Likely multifactorial etiology given underlying COPD as well as mild exacerbation of HFpE, atelectasis.  Now resolved  Acute exacerbation of HFpEF - Likely secondary to aggressive volume resuscitation.  Chest x-ray reviewed by prior provider noting congestion with mild pleural effusion.  Will hold on additional lasix  as now appears to be euvolemic   COPD exacerbation Appears resolved   Acute blood loss anemia - Likely secondary to surgical intervention and marrow stunting with hemodilution. Hgb has drifted to 7 today, no signs blood loss. Would transfuse tomorrow if hgb drops below 7   Colostomy status Enteroabdominal fistula - revision of end colostomy with partial colectomy and resiting of end colostomy with repair of parastomal hernia performed on 4/25. IR placed b/l drains on 4/25 for ongoing intra-abdominal fistula. Surgical culture growing candida, is currently on fluconazole  and unasyn . Ostomy has output. Currently on tpn.  As we don't yet have source control will need to continue fluconazole  for the time being. Off abx today. On TPN.  Adenocarcinoma of the colon - S/p robotic assisted laparoscopic partial colectomy 10/27/23.  Readmitted 4/25 as above   Subjective: stable, no sig pain when doesn't move.   Physical Exam: Vitals:   11/28/23 1919 11/28/23 2011  11/29/23 0344 11/29/23 0854  BP: 128/62 (!) 151/64 134/67 (!) 147/74  Pulse: 84 78 80 76  Resp:  20  16  Temp: 98.8 F (37.1 C) 98 F (36.7 C)  98.6 F (37 C)  TempSrc: Oral Oral    SpO2: 94% 94% 94% 96%  Weight:      Height:        GENERAL:  Alert, pleasant, no acute distress  CARDIOVASCULAR:  RRR,  RESPIRATORY:  Clear to auscultation, no wheezing, rales, or rhonchi GASTROINTESTINAL:  Soft, diffusely minimally tender, stoma present, bilateral drains present, anterior surgical incisions, ostomy with brown drainage EXTREMITIES:  No LE edema bilaterally NEURO:  No new focal deficits appreciated SKIN:  No rashes noted PSYCH:  Appropriate mood and affect    Data Reviewed:  No new imaging to review  Labs: CBC: Recent Labs  Lab 11/23/23 0527 11/24/23 0509 11/25/23 0607 11/28/23 0549  WBC 23.8* 21.3* 22.9* 19.2*  NEUTROABS 18.5* 16.2* 17.5*  --   HGB 8.3* 7.9* 7.8* 7.0*  HCT 25.6* 24.4* 23.1* 21.8*  MCV 84.2 85.6 83.7 87.2  PLT 496* 516* 653* 865*   Basic Metabolic Panel: Recent Labs  Lab 11/23/23 0527 11/26/23 0453 11/27/23 0523 11/28/23 0549 11/29/23 0630  NA 136 135 136 137 135  K 4.6 4.3 4.6 4.5 4.6  CL 98 99 100 101 101  CO2 26 27 25 25 27   GLUCOSE 188* 140* 146* 149* 151*  BUN 16 22 20 20 20   CREATININE 0.52 0.67 0.54 0.50 0.54  CALCIUM 8.5* 8.3* 8.3* 8.3* 8.4*  MG 2.0 2.2  --   --   --  PHOS 4.3 3.7  --   --   --    Liver Function Tests: Recent Labs  Lab 11/23/23 0527 11/26/23 0453  AST 15 15  ALT 8 9  ALKPHOS 125 159*  BILITOT 0.4 0.5  PROT 6.1* 6.2*  ALBUMIN  2.0* 1.8*   CBG: Recent Labs  Lab 11/28/23 2031 11/28/23 2308 11/29/23 0527 11/29/23 0859 11/29/23 1141  GLUCAP 128* 161* 150* 149* 144*    Scheduled Meds:  acetaminophen   1,000 mg Oral Q6H   alteplase   2 mg Intracatheter Once   amLODipine   5 mg Oral Daily   Chlorhexidine  Gluconate Cloth  6 each Topical Daily   diphenoxylate -atropine   2 tablet Oral QID   enoxaparin   (LOVENOX ) injection  40 mg Subcutaneous Q24H   insulin  aspart  0-9 Units Subcutaneous Q6H   latanoprost   1 drop Both Eyes QHS   loperamide   4 mg Oral Q6H   octreotide   200 mcg Intravenous Q8H   pantoprazole  (PROTONIX ) IV  40 mg Intravenous Q12H   sodium chloride  flush  10-40 mL Intracatheter Q12H   sodium chloride  flush  5 mL Intracatheter Q8H   Continuous Infusions:  fluconazole  (DIFLUCAN ) IV 200 mg (11/29/23 1141)   iron  sucrose 440 mL/hr at 11/27/23 1900   TPN ADULT (ION) 65 mL/hr at 11/28/23 1910   TPN ADULT (ION)     PRN Meds:.diphenhydrAMINE  **OR** diphenhydrAMINE , hydrALAZINE , ipratropium-albuterol , methocarbamol  **OR** methocarbamol  (ROBAXIN ) injection, morphine  injection, ondansetron  (ZOFRAN ) IV, mouth rinse, oxyCODONE , prochlorperazine  **OR** prochlorperazine , sodium chloride  flush   Disposition: Per primary team     Author: Raymonde Calico, MD 11/29/2023 1:25 PM  For on call review [email or web address].

## 2023-11-29 NOTE — Consult Note (Signed)
 PHARMACY - TOTAL PARENTERAL NUTRITION CONSULT NOTE   Indication: Prolonged ileus  Patient Measurements: Height: 5' (152.4 cm) Weight: 57.1 kg (125 lb 14.1 oz) IBW/kg (Calculated) : 45.5 TPN AdjBW (KG): 51.8 Body mass index is 24.58 kg/m. Usual Weight: 64.9  Assessment:  NE is a 88 yo female who presented with colostomy issues w separation of mucocutaneous junction and distal necrosis post parietal colectomy due to adenocarcinoma of colon. Previously colectomy was 4/8. They underwent revision of colostomy on 4/25. Pharmacy has been consulted to manage this patient's TPN.   Glucose / Insulin : BG 128-150 6 units of novolog  in the last 24 hours,  sSSI q4h.  Electrolytes: WNL Renal: Scr < 1 Hepatic: WNL Intake / Output; net (-) MIVF: No MIVF at this time  Intake/Output Summary (Last 24 hours) at 11/29/2023 1054 Last data filed at 11/29/2023 0900 Gross per 24 hour  Intake --  Output 1110 ml  Net -1110 ml    GI Imaging: 4/23: CT Abdomen Pelvis 1. Surgical changes from a left colectomy and left lower colostomy. The distal colostomy is abnormal. Beginning approximately 10 cm from the ostomy site the colon is very amorphous and is filled with fluid. There are abnormal gas collections around the bowel and there is a large collection of gas in the subcutaneous tissues. Findings very worrisome for necrosis of the distal 10 cm of colon. 4/30 CT abd/pel: --Multiple fluid collections are noted throughout the peritoneal space, concerning for possible abscesses.  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, complicated by pertinent comorbid conditions   --2 new drains placed yesterday 5/2 by interventional radiology in 2 new right-sided fluid collections   Central access: 11/17/23 TPN start date:  11/17/23  Nutritional Goals: Goal TPN rate is 65 mL/hr (provides 87 g of protein and 1666 kcals per day)  RD Assessment: Estimated Needs Total Energy Estimated Needs: 1600-1800kcal/day Total Protein Estimated Needs: 80-90g/day Total Fluid Estimated Needs: 1.4-1.6L/day  Current Nutrition:  NPO  Plan:  ---Continue custom TPN at goal of 65 mL/hr  --- Electrolytes in TPN: Na 27mEq/L, K 21mEq/L, Ca 5mEq/L, Mg 5 mEq/L, and Phos 15. Cl:Ac 1:1 ---Add standard MVI and trace elements to TPN ---Continue Sensitive q6h SSI and adjust as needed  ---chromium 10 mcg/day per dietary recommendations:  unable to provide at this time due to national backorder status, removed from TPN 11/24/23 ---Monitor TPN labs on Mon/Thurs  Lucion Dilger A Aquanetta Schwarz, PharmD 11/29/2023,10:54 AM

## 2023-11-29 NOTE — Progress Notes (Signed)
 CC:  fistula  Subjective: spirits today, afebrile. No abd pain Drains slowing down, ostomy working Drains 80cc most active RLQ VSS afebrile off A/Bs  Objective: Vital signs in last 24 hours: Temp:  [98 F (36.7 C)-98.8 F (37.1 C)] 98.6 F (37 C) (05/11 0854) Pulse Rate:  [76-84] 76 (05/11 0854) Resp:  [16-20] 16 (05/11 0854) BP: (124-151)/(49-74) 147/74 (05/11 0854) SpO2:  [94 %-96 %] 96 % (05/11 0854) Last BM Date : 11/27/23  Intake/Output from previous day: 05/10 0701 - 05/11 0700 In: -  Out: 1380 [Urine:1300; Drains:80] Intake/Output this shift: Total I/O In: -  Out: 160 [Drains:80; Stool:80]  Physical exam:  Debilitated Abd: soft, bilious drainage, now Left side seems to be more active. Drain holding suction. No peritonitis. Surgical drains are the ones controlling fistula, IR drain not doing much. Ostomy with good liquid output pink and patent   Lab Results: CBC  Recent Labs    11/28/23 0549  WBC 19.2*  HGB 7.0*  HCT 21.8*  PLT 865*   BMET Recent Labs    11/28/23 0549 11/29/23 0630  NA 137 135  K 4.5 4.6  CL 101 101  CO2 25 27  GLUCOSE 149* 151*  BUN 20 20  CREATININE 0.50 0.54  CALCIUM 8.3* 8.4*   PT/INR No results for input(s): "LABPROT", "INR" in the last 72 hours. ABG No results for input(s): "PHART", "HCO3" in the last 72 hours.  Invalid input(s): "PCO2", "PO2"  Studies/Results: No results found.  Anti-infectives: Anti-infectives (From admission, onward)    Start     Dose/Rate Route Frequency Ordered Stop   11/18/23 1015  fluconazole  (DIFLUCAN ) IVPB 200 mg        200 mg 100 mL/hr over 60 Minutes Intravenous Every 24 hours 11/18/23 0930     11/13/23 1700  Ampicillin -Sulbactam (UNASYN ) 3 g in sodium chloride  0.9 % 100 mL IVPB        3 g 200 mL/hr over 30 Minutes Intravenous Every 6 hours 11/13/23 1555 11/27/23 1239   11/13/23 0600  cefoTEtan  (CEFOTAN ) 2 g in sodium chloride  0.9 % 100 mL IVPB        2 g 200 mL/hr over 30 Minutes  Intravenous On call to O.R. 11/13/23 0043 11/13/23 0954       Assessment/Plan: Doing well and responding to current Rx keep npo Labs in am She completed antibiotics and is not showing signs of acitve infection Reassess need further  A/bs base on wbc and clinical picture No need for surgical intervention at this time  Evelia Hipp, MD, FACS  11/29/2023

## 2023-11-30 DIAGNOSIS — C189 Malignant neoplasm of colon, unspecified: Secondary | ICD-10-CM | POA: Diagnosis not present

## 2023-11-30 LAB — RETIC PANEL
Immature Retic Fract: 30 % — ABNORMAL HIGH (ref 2.3–15.9)
RBC.: 2.73 MIL/uL — ABNORMAL LOW (ref 3.87–5.11)
Retic Count, Absolute: 162.4 10*3/uL (ref 19.0–186.0)
Retic Ct Pct: 6 % — ABNORMAL HIGH (ref 0.4–3.1)
Reticulocyte Hemoglobin: 28.8 pg (ref >27.9)

## 2023-11-30 LAB — COMPREHENSIVE METABOLIC PANEL WITH GFR
ALT: 9 U/L (ref 0–44)
AST: 14 U/L — ABNORMAL LOW (ref 15–41)
Albumin: 2 g/dL — ABNORMAL LOW (ref 3.5–5.0)
Alkaline Phosphatase: 197 U/L — ABNORMAL HIGH (ref 38–126)
Anion gap: 9 (ref 5–15)
BUN: 22 mg/dL (ref 8–23)
CO2: 24 mmol/L (ref 22–32)
Calcium: 8.5 mg/dL — ABNORMAL LOW (ref 8.9–10.3)
Chloride: 103 mmol/L (ref 98–111)
Creatinine, Ser: 0.5 mg/dL (ref 0.44–1.00)
GFR, Estimated: >60 mL/min (ref >60)
Glucose, Bld: 122 mg/dL — ABNORMAL HIGH (ref 70–99)
Potassium: 4.5 mmol/L (ref 3.5–5.1)
Sodium: 136 mmol/L (ref 135–145)
Total Bilirubin: 0.5 mg/dL (ref 0.0–1.2)
Total Protein: 7 g/dL (ref 6.5–8.1)

## 2023-11-30 LAB — CBC
HCT: 23.9 % — ABNORMAL LOW (ref 36.0–46.0)
Hemoglobin: 7.5 g/dL — ABNORMAL LOW (ref 12.0–15.0)
MCH: 27.5 pg (ref 26.0–34.0)
MCHC: 31.4 g/dL (ref 30.0–36.0)
MCV: 87.5 fL (ref 80.0–100.0)
Platelets: 945 10*3/uL (ref 150–400)
RBC: 2.73 MIL/uL — ABNORMAL LOW (ref 3.87–5.11)
RDW: 16.9 % — ABNORMAL HIGH (ref 11.5–15.5)
WBC: 16.4 10*3/uL — ABNORMAL HIGH (ref 4.0–10.5)
nRBC: 0 % (ref 0.0–0.2)

## 2023-11-30 LAB — PHOSPHORUS: Phosphorus: 4.2 mg/dL (ref 2.5–4.6)

## 2023-11-30 LAB — GLUCOSE, CAPILLARY
Glucose-Capillary: 126 mg/dL — ABNORMAL HIGH (ref 70–99)
Glucose-Capillary: 126 mg/dL — ABNORMAL HIGH (ref 70–99)
Glucose-Capillary: 132 mg/dL — ABNORMAL HIGH (ref 70–99)

## 2023-11-30 LAB — IRON AND TIBC
Iron: 15 ug/dL — ABNORMAL LOW (ref 28–170)
Saturation Ratios: 11 % (ref 10.4–31.8)
TIBC: 140 ug/dL — ABNORMAL LOW (ref 250–450)
UIBC: 125 ug/dL

## 2023-11-30 LAB — MAGNESIUM: Magnesium: 2.3 mg/dL (ref 1.7–2.4)

## 2023-11-30 LAB — FERRITIN: Ferritin: 1120 ng/mL — ABNORMAL HIGH (ref 11–307)

## 2023-11-30 LAB — PROTIME-INR
INR: 1.3 — ABNORMAL HIGH (ref 0.8–1.2)
Prothrombin Time: 16.5 s — ABNORMAL HIGH (ref 11.4–15.2)

## 2023-11-30 LAB — TRIGLYCERIDES: Triglycerides: 105 mg/dL (ref <150)

## 2023-11-30 MED ORDER — TRACE MINERALS CU-MN-SE-ZN 300-55-60-3000 MCG/ML IV SOLN
INTRAVENOUS | Status: AC
  Filled 2023-11-30: qty 582.4

## 2023-11-30 NOTE — Progress Notes (Signed)
 Platelets this morning was [id number], on-call notified but no new order. We will continue to monitor.

## 2023-11-30 NOTE — Progress Notes (Signed)
 OT Cancellation Note  Patient Details Name: Kristin Miller MRN: [id number] DOB: January 06, 1936   Cancelled Treatment:    Reason Eval/Treat Not Completed: Other (comment). Chart reviewed and treatment attempted. Pt and her spouse present with NT emptying colostomy. Pt reports she had just returned to bed after being up in the recliner for a few hours after working with PT this AM. Wishes to forgo additional therapy session and work with OT tomorrow. Will re-attempt tomorrow.  Tekela Garguilo E Linkoln Alkire 11/30/2023, 3:36 PM

## 2023-11-30 NOTE — TOC Progression Note (Signed)
 Transition of Care [facility]) - Progression Note    Patient Details  Name: Kristin Miller MRN: [id number] Date of Birth: [birth year]  [facility] CM/SW Contact  Loman Risk, RN Phone Number: 11/30/2023, 9:37 AM  Clinical Narrative:     Patient still with TPN and NPO No Snf bed offers, will resent out referral once TPN starts to be weaned. MD updated       Expected Discharge Plan and Services                                               Social Determinants of Health (SDOH) Interventions SDOH Screenings   Food Insecurity: No Food Insecurity (11/14/2023)  Housing: Low Risk  (11/24/2023)  Transportation Needs: No Transportation Needs (11/14/2023)  Utilities: Not At Risk (11/14/2023)  Alcohol Screen: Low Risk  (09/25/2021)  Depression (PHQ2-9): Low Risk  (08/25/2023)  Financial Resource Strain: Low Risk  (01/09/2023)   Received from [facility], [facility]  Physical Activity: Inactive (09/25/2021)  Social Connections: Moderately Integrated (11/14/2023)  Stress: No Stress Concern Present (09/25/2021)  Tobacco Use: Low Risk  (11/13/2023)    Readmission Risk Interventions    11/20/2023    8:57 AM  Readmission Risk Prevention Plan  Transportation Screening Complete  PCP or Specialist Appt within 5-7 Days Complete  Medication Review (RN CM) Complete

## 2023-11-30 NOTE — Consult Note (Signed)
 PHARMACY - TOTAL PARENTERAL NUTRITION CONSULT NOTE   Indication: Prolonged ileus  Patient Measurements: Height: 5' (152.4 cm) Weight: 56.4 kg (124 lb 5.4 oz) IBW/kg (Calculated) : 45.5 TPN AdjBW (KG): 51.8 Body mass index is 24.28 kg/m. Usual Weight: 64.9 kg  Assessment:  NE is a 88 yo female who presented with colostomy issues w separation of mucocutaneous junction and distal necrosis post parietal colectomy due to adenocarcinoma of colon. Previously colectomy was 4/8. They underwent revision of colostomy on 4/25. Pharmacy has been consulted to manage this patient's TPN.   Glucose / Insulin : BG 126-164 5 units of novolog  in the last 24 hours,  sSSI q6h.  Electrolytes: WNL Renal: Scr < 1 Hepatic: WNL Intake / Output; net (-) 4123 mL MIVF: No MIVF at this time  Intake/Output Summary (Last 24 hours) at 11/30/2023 0711 Last data filed at 11/30/2023 1610 Gross per 24 hour  Intake 988.27 ml  Output 270 ml  Net 718.27 ml    GI Imaging: 4/23: CT Abdomen Pelvis: Surgical changes from a left colectomy and left lower colostomy. The distal colostomy is abnormal. Beginning approximately 10 cm from the ostomy site the colon is very amorphous and is filled with fluid. There are abnormal gas collections around the bowel and there is a large collection of gas in the subcutaneous tissues. Findings very worrisome for necrosis of the distal 10 cm of colon. 4/30 CT abd/pel: Multiple fluid collections are noted throughout the peritoneal space, concerning for possible abscesses. 5/7: CT abdomen   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, complicated by pertinent comorbid conditions   --2 new drains placed yesterday 5/2 by interventional radiology in 2 new right-sided fluid collections   Central access: 11/17/23 TPN  start date: 11/17/23  Nutritional Goals: Goal TPN rate is 65 mL/hr (provides 87 g of protein and 1666 kcals per day)  RD Assessment: Estimated Needs Total Energy Estimated Needs: 1600-1800kcal/day Total Protein Estimated Needs: 80-90g/day Total Fluid Estimated Needs: 1.4-1.6L/day  Current Nutrition:  NPO  Plan:  ---Continue custom TPN at goal of 65 mL/hr  --- Electrolytes in TPN: Na 37mEq/L, K 2mEq/L, Ca 64mEq/L, Mg 5 mEq/L, and Phos 15. Cl:Ac 1:1 ---Add standard MVI and trace elements to TPN ---Continue Sensitive q6h SSI and adjust as needed  ---chromium 10 mcg/day per dietary recommendations:  unable to provide at this time due to national backorder status, removed from TPN 11/24/23 ---Monitor TPN labs on Mon/Thurs  Thank you for involving pharmacy in this patient's care.   Ananias Balls, PharmD Clinical Pharmacist 11/30/2023 7:14 AM

## 2023-11-30 NOTE — Plan of Care (Signed)

## 2023-11-30 NOTE — Progress Notes (Signed)
 Physical Therapy Treatment Patient Details Name: Kristin Miller MRN: [id number] DOB: 04-Nov-1935 Today's Date: 11/30/2023   History of Present Illness LESSLIE VANDYCK presented to [facility] on 4/25 for planned surgery, with the diagnosis of colostomy complication; now s/p revision. Other dx include back pain, glaucoma, HTN, COPD.    PT Comments  Pt was pleasant and motivated to participate during the session and put forth good effort throughout. Pt demonstrated fair carryover of log roll sequencing with min verbal cuing provided as well as min A for BLE and trunk control.  Pt was steady in unsupported sitting at the EOB and required extra time and effort with transfers but no physical assistance.  Pt demonstrated improved activity tolerance with gait this session per below and was generally steady with the RW with no overt LOB.  Pt reported no adverse symptoms during the session with SpO2 91-92% and HR WNL throughout on room air.  Pt will benefit from continued PT services upon discharge to safely address deficits listed in patient problem list for decreased caregiver assistance and eventual return to PLOF.       If plan is discharge home, recommend the following: A lot of help with walking and/or transfers;A lot of help with bathing/dressing/bathroom;Help with stairs or ramp for entrance;Assist for transportation;Assistance with cooking/housework   Can travel by private vehicle     Yes  Equipment Recommendations  Rolling walker (2 wheels)    Recommendations for Other Services       Precautions / Restrictions Precautions Precautions: Fall Restrictions Weight Bearing Restrictions Per Provider Order: No Other Position/Activity Restrictions: Multiple drains     Mobility  Bed Mobility Overal bed mobility: Needs Assistance Bed Mobility: Rolling, Sidelying to Sit Rolling: Min assist Sidelying to sit: Min assist       General bed mobility comments: Log roll training/review for decreased  caregiver assistance and for pain control; min A for BLE and trunk control    Transfers Overall transfer level: Needs assistance Equipment used: Rolling walker (2 wheels) Transfers: Sit to/from Stand Sit to Stand: Contact guard assist           General transfer comment: Min verbal cues for hand placement; extra time and effort to come to standing with fair to good eccentric control with BUE assist    Ambulation/Gait Ambulation/Gait assistance: Contact guard assist Gait Distance (Feet): 15 Feet x 2 Assistive device: Rolling walker (2 wheels) Gait Pattern/deviations: Step-through pattern, Decreased step length - right, Decreased step length - left Gait velocity: decreased     General Gait Details: Slow cadence with short B step length but steady with no overt LOB   Stairs             Wheelchair Mobility     Tilt Bed    Modified Rankin (Stroke Patients Only)       Balance Overall balance assessment: Needs assistance Sitting-balance support: Feet supported Sitting balance-Leahy Scale: Good     Standing balance support: Bilateral upper extremity supported, Reliant on assistive device for balance, During functional activity, Single extremity supported Standing balance-Leahy Scale: Good                              Communication Communication Communication: No apparent difficulties  Cognition Arousal: Alert Behavior During Therapy: WFL for tasks assessed/performed   PT - Cognitive impairments: No apparent impairments  Following commands: Intact      Cueing Cueing Techniques: Verbal cues, Tactile cues  Exercises Other Exercises Other Exercises: Log roll training review    General Comments        Pertinent Vitals/Pain Pain Assessment Pain Assessment: No/denies pain    Home Living                          Prior Function            PT Goals (current goals can now be found in the care  plan section) Progress towards PT goals: Progressing toward goals    Frequency    Min 2X/week      PT Plan      Co-evaluation              AM-PAC PT "6 Clicks" Mobility   Outcome Measure  Help needed turning from your back to your side while in a flat bed without using bedrails?: A Little Help needed moving from lying on your back to sitting on the side of a flat bed without using bedrails?: A Little Help needed moving to and from a bed to a chair (including a wheelchair)?: A Little Help needed standing up from a chair using your arms (e.g., wheelchair or bedside chair)?: A Little Help needed to walk in hospital room?: A Little Help needed climbing 3-5 steps with a railing? : A Lot 6 Click Score: 17    End of Session Equipment Utilized During Treatment: Gait belt;Other (comment) (gait belt placed high to avoid drains) Activity Tolerance: Patient tolerated treatment well Patient left: in chair;with call bell/phone within reach;with chair alarm set Nurse Communication: Mobility status PT Visit Diagnosis: Muscle weakness (generalized) (M62.81);Difficulty in walking, not elsewhere classified (R26.2);Unsteadiness on feet (R26.81)     Time: 1610-9604 PT Time Calculation (min) (ACUTE ONLY): 25 min  Charges:    $Gait Training: 8-22 mins $Therapeutic Activity: 8-22 mins PT General Charges $$ ACUTE PT VISIT: 1 Visit                     D. Scott Trammell Bowden PT, DPT 11/30/23, 10:56 AM

## 2023-11-30 NOTE — Progress Notes (Signed)
 [id number]  Subjective: No compalints, afebrile. No abd pain Drains slowing down, ostomy working Drains 190cc most active RLQ VSS afebrile off A/Bs   Objective: Vital signs in last 24 hours: Temp:  [97.8 F (36.6 C)-98.8 F (37.1 C)] 98.4 F (36.9 C) (05/12 0730) Pulse Rate:  [74-86] 74 (05/12 0730) Resp:  [16-18] 18 (05/12 0730) BP: (94-154)/(67-74) 140/67 (05/12 0730) SpO2:  [95 %-97 %] 95 % (05/12 0730) Weight:  [56.4 kg] 56.4 kg (05/12 0328) Last BM Date : 11/27/23  Intake/Output from previous day: 05/11 0701 - 05/12 0700 In: 988.3 [I.V.:788.3; IV Piggyback:200] Out: 270 [Drains:190; Stool:80] Intake/Output this shift: Total I/O In: -  Out: 700 [Urine:700]  Physical exam:  Debilitated Abd: soft, bilious drainage, now Left side seems to be more active. Drain holding suction. No peritonitis. Surgical drains are the ones controlling fistula, IR drain not doing much. Ostomy with good liquid output pink and patent   Lab Results: CBC  Recent Labs    11/28/23 0549 11/30/23 0435  WBC 19.2* 16.4*  HGB 7.0* 7.5*  HCT 21.8* 23.9*  PLT 865* 945*   BMET Recent Labs    11/29/23 0630 11/30/23 0435  NA 135 136  K 4.6 4.5  CL 101 103  CO2 27 24  GLUCOSE 151* 122*  BUN 20 22  CREATININE 0.54 0.50  CALCIUM 8.4* 8.5*   PT/INR Recent Labs    11/30/23 0435  LABPROT 16.5*  INR 1.3*   ABG No results for input(s): "PHART", "HCO3" in the last 72 hours.  Invalid input(s): "PCO2", "PO2"  Studies/Results: No results found.  Anti-infectives: Anti-infectives (From admission, onward)    Start     Dose/Rate Route Frequency Ordered Stop   11/18/23 1015  fluconazole  (DIFLUCAN ) IVPB 200 mg        200 mg 100 mL/hr over 60 Minutes Intravenous Every 24 hours 11/18/23 0930     11/13/23 1700  Ampicillin -Sulbactam (UNASYN ) 3 g in sodium chloride  0.9 % 100 mL IVPB        3 g 200 mL/hr over 30 Minutes Intravenous Every 6 hours 11/13/23 1555 11/27/23 1239   11/13/23 0600   cefoTEtan  (CEFOTAN ) 2 g in sodium chloride  0.9 % 100 mL IVPB        2 g 200 mL/hr over 30 Minutes Intravenous On call to O.R. 11/13/23 0043 11/13/23 0954       Assessment/Plan:  May trial CLD, we will go slow an monitor output , currently we were able to go from a high output fistula to a low one. I understand the output may increase with PO intake. I want to do trial of liquids and go from there, she is not toxic and does not need surgical intervention Continue TPN for now  Evelia Hipp, MD, [facility]  11/30/2023

## 2023-11-30 NOTE — Consult Note (Signed)
 Consult Note   Patient: Kristin Miller RJJ:[id number] DOB: 18-Oct-1935 DOA: 11/13/2023     17 DOS: the patient was seen and examined on 11/30/2023   Brief hospital course:  Pt. with PMH of hypertension, colon cancer, adenocarcinoma s/p resection and colostomy, right breast cancer, T-spine fracture, left shoulder BCC, admitted on 11/13/2023, with complaint of leaking of ostomy site patient was admitted by general surgery for ostomy revision, was found to have dyspnea and hypoxia.  So TRH was consulted for further management.    Assessment and Plan:   Acute hypoxic respiratory failure - Initially requiring 2 L nasal cannula.  Likely multifactorial etiology given underlying COPD as well as mild exacerbation of HFpE, atelectasis.  Now resolved  Acute exacerbation of HFpEF - Likely secondary to aggressive volume resuscitation.  Chest x-ray reviewed by prior provider noting congestion with mild pleural effusion.  Will hold on additional lasix  as now appears to be euvolemic   COPD exacerbation Appears resolved   Acute blood loss anemia - Likely secondary to surgical intervention and marrow stunting with hemodilution. Hgb has drifted to 7s, no apparent bleeding  Thrombocytosis - likely reactive as was normal prior to this hospitalization. Reviewed w/ Dr. Wilhelmenia Harada today, she advises iron  panel and retics to eval for iron  deficiency anemia (treat if present), otherwise nothing to do save treat underlying problem   Colostomy status Enteroabdominal fistula - revision of end colostomy with partial colectomy and resiting of end colostomy with repair of parastomal hernia performed on 4/25. IR placed b/l drains on 4/25 for ongoing intra-abdominal fistula. Surgical culture growing candida, is currently on fluconazole  and unasyn . Ostomy has output. Currently on tpn.  As we don't yet have source control will need to continue fluconazole  for the time being. Off abx today. On TPN, diet advanced to clears  today  Adenocarcinoma of the colon - S/p robotic assisted laparoscopic partial colectomy 10/27/23.  Readmitted 4/25 as above   Subjective: stable, no sig pain when doesn't move. Tolerating start of clears  Physical Exam: Vitals:   11/29/23 1636 11/29/23 1952 11/30/23 0328 11/30/23 0730  BP: (!) 144/74 94/73 (!) 154/67 (!) 140/67  Pulse: 80 86 78 74  Resp: 18 16 16 18   Temp: 97.8 F (36.6 C) 98.8 F (37.1 C) 98.5 F (36.9 C) 98.4 F (36.9 C)  TempSrc: Oral Oral Oral Oral  SpO2: 97% 95% 96% 95%  Weight:   56.4 kg   Height:        GENERAL:  Alert, pleasant, no acute distress  CARDIOVASCULAR:  RRR,  RESPIRATORY:  Clear to auscultation, no wheezing, rales, or rhonchi GASTROINTESTINAL:  Soft, diffusely minimally tender, stoma present, bilateral drains present, anterior surgical incisions, ostomy with brown drainage EXTREMITIES:  No LE edema bilaterally NEURO:  No new focal deficits appreciated SKIN:  No rashes noted PSYCH:  Appropriate mood and affect    Data Reviewed:  No new imaging to review  Labs: CBC: Recent Labs  Lab 11/24/23 0509 11/25/23 0607 11/28/23 0549 11/30/23 0435  WBC 21.3* 22.9* 19.2* 16.4*  NEUTROABS 16.2* 17.5*  --   --   HGB 7.9* 7.8* 7.0* 7.5*  HCT 24.4* 23.1* 21.8* 23.9*  MCV 85.6 83.7 87.2 87.5  PLT 516* 653* 865* 945*   Basic Metabolic Panel: Recent Labs  Lab 11/26/23 0453 11/27/23 0523 11/28/23 0549 11/29/23 0630 11/30/23 0435  NA 135 136 137 135 136  K 4.3 4.6 4.5 4.6 4.5  CL 99 100 101 101 103  CO2 27 25  25 27 24   GLUCOSE 140* 146* 149* 151* 122*  BUN 22 20 20 20 22   CREATININE 0.67 0.54 0.50 0.54 0.50  CALCIUM 8.3* 8.3* 8.3* 8.4* 8.5*  MG 2.2  --   --   --  2.3  PHOS 3.7  --   --   --  4.2   Liver Function Tests: Recent Labs  Lab 11/26/23 0453 11/30/23 0435  AST 15 14*  ALT 9 9  ALKPHOS 159* 197*  BILITOT 0.5 0.5  PROT 6.2* 7.0  ALBUMIN  1.8* 2.0*   CBG: Recent Labs  Lab 11/29/23 1141 11/29/23 1811  11/29/23 2345 11/30/23 0518 11/30/23 1217  GLUCAP 144* 150* 164* 126* 126*    Scheduled Meds:  acetaminophen   1,000 mg Oral Q6H   alteplase   2 mg Intracatheter Once   amLODipine   5 mg Oral Daily   Chlorhexidine  Gluconate Cloth  6 each Topical Daily   diphenoxylate -atropine   2 tablet Oral QID   enoxaparin  (LOVENOX ) injection  40 mg Subcutaneous Q24H   insulin  aspart  0-9 Units Subcutaneous Q6H   latanoprost   1 drop Both Eyes QHS   loperamide   4 mg Oral Q6H   octreotide   200 mcg Intravenous Q8H   pantoprazole  (PROTONIX ) IV  40 mg Intravenous Q12H   sodium chloride  flush  10-40 mL Intracatheter Q12H   sodium chloride  flush  5 mL Intracatheter Q8H   Continuous Infusions:  fluconazole  (DIFLUCAN ) IV 200 mg (11/30/23 0954)   iron  sucrose 440 mL/hr at 11/30/23 1610   TPN ADULT (ION) 65 mL/hr at 11/30/23 9604   TPN ADULT (ION)     PRN Meds:.diphenhydrAMINE  **OR** diphenhydrAMINE , hydrALAZINE , ipratropium-albuterol , methocarbamol  **OR** methocarbamol  (ROBAXIN ) injection, morphine  injection, ondansetron  (ZOFRAN ) IV, mouth rinse, oxyCODONE , prochlorperazine  **OR** prochlorperazine , sodium chloride  flush   Disposition: Per primary team     Author: Raymonde Calico, MD 11/30/2023 1:19 PM  For on call review [email or web address].

## 2023-11-30 NOTE — Progress Notes (Signed)
 Referring Provider(s): Dr. Evelia Hipp Monette Angus)  Supervising Physician: Fernando Hoyer  Patient Status:  [facility] - In-pt  Chief Complaint: Abdominal abscesses s/p drain placement seen for follow up  Brief History:  88 year old female with a history of colon cancer who underwent a colon resection with ostomy formation secondary to colon cancer on 4/8 with Dr. Dana Duncan. Unfortunately, patient developed necrosis of the ostomy site and subsequently underwent colostomy revision with placement of 2 drains on 4/25. She continued to have some abdominal discomfort and weakness. CT A/P on 4/30 concerning for multiple intra-abdominal abscesses. IR consulted for placement of percutaneous abscess drains in lieu of a third surgical operation. On 5/2 patient underwent placement of 2 LUQ drains in IR with Dr. Wilnette Haste.   Subjective:  Patient resting in bed with husband at bedside. Currently without complaints. Says that she has been cleared to advance diet to clear liquids, but does not like many of the options provided.  Allergies: Combigan  [brimonidine  tartrate-timolol ], Lisinopril , and Other  Medications: Prior to Admission medications   Medication Sig Start Date End Date Taking? Authorizing Provider  acetaminophen  (TYLENOL ) 500 MG tablet Take 500 mg by mouth every 6 (six) hours as needed for pain.   Yes [provider]  alendronate  (FOSAMAX ) 70 MG tablet Take 1 tablet (70 mg total) by mouth every 7 (seven) days for 48 doses. Take with a full glass of water  on an empty stomach. 07/21/23 06/15/24 Yes Ziglar, Susan K, MD  amLODipine  (NORVASC ) 5 MG tablet Take 1 tablet (5 mg total) by mouth daily. 07/28/23  Yes Ziglar, Susan K, MD  docusate sodium (COLACE) 100 MG capsule Take 100 mg by mouth daily as needed for mild constipation.   Yes [provider]  latanoprost  (XALATAN ) 0.005 % ophthalmic solution 1 drop at bedtime. Eye doctor both eyes 12/18/20  Yes [provider]   loperamide  (IMODIUM ) 2 MG capsule Take 1 capsule (2 mg total) by mouth as needed for diarrhea or loose stools. May take 1-2 capsules every 8 hours 11/09/23  Yes Pabon, Diego F, MD  losartan -hydrochlorothiazide  (HYZAAR) 100-12.5 MG tablet Take 1 tablet by mouth daily. 07/21/23  Yes Ziglar, Susan K, MD  metroNIDAZOLE  (FLAGYL ) 500 MG tablet Take 2 tabs (1000 mg total) at 8 am, again at 2 pm, and again at 8 pm the day prior to surgery. 11/11/23  Yes Pabon, Diego F, MD  Multiple Minerals-Vitamins (CAL MAG ZINC +D3 PO) Take 1 tablet by mouth daily.   Yes [provider]  neomycin  (MYCIFRADIN ) 500 MG tablet Take 2 tabs (1000 mg total) at 8 am, again at 2 pm, and again at 8 pm the day prior to surgery. 11/11/23  Yes Pabon, Diego F, MD  oxyCODONE  (OXY IR/ROXICODONE ) 5 MG immediate release tablet Take 1 tablet (5 mg total) by mouth every 4 (four) hours as needed for severe pain (pain score 7-10). 11/09/23  Yes Pabon, Diego F, MD  potassium chloride  SA (KLOR-CON  M) 20 MEQ tablet Take 2 tablets (40 mEq total) by mouth daily. 11/09/23 12/09/23 Yes Pabon, Diego F, MD     Vital Signs: BP (!) 140/67 (BP Location: Right Leg)   Pulse 74   Temp 98.4 F (36.9 C) (Oral)   Resp 18   Ht 5' (1.524 m)   Wt 124 lb 5.4 oz (56.4 kg)   SpO2 95%   BMI 24.28 kg/m   Physical Exam Vitals reviewed.  Constitutional:      Appearance: Normal appearance.  Cardiovascular:  Rate and Rhythm: Normal rate.  Pulmonary:     Effort: Pulmonary effort is normal.  Abdominal:     General: Abdomen is flat.     Palpations: Abdomen is soft.     Tenderness: There is no abdominal tenderness.     Comments: 2 LUQ drains in sutured and stat locked in place. Insertion site is non-erythematous. Overlying bandage clean and dry.  Drain 3 with no output in bulb. Bulb is appropriately charged Drain 4 w/ trace bilious fluid. Bulb appropriately charged 2x surgical drain in place w/ minimal output Ostomy in place  Musculoskeletal:         General: Normal range of motion.     Cervical back: Normal range of motion.  Skin:    General: Skin is warm and dry.  Neurological:     Mental Status: She is alert. Mental status is at baseline.  Psychiatric:        Behavior: Behavior normal.     Drain Location: LUQ x2  Size: Fr size: 10 Fr Date of placement: 11/20/23  Currently to: Drain collection device: suction bulb 24 hour output:  Output by Drain (mL) 11/28/23 0701 - 11/28/23 1900 11/28/23 1901 - 11/29/23 0700 11/29/23 0701 - 11/29/23 1900 11/29/23 1901 - 11/30/23 0700 11/30/23 0701 - 11/30/23 1523  Closed System Drain 2 Left Abdomen Bulb (JP) 19 Fr.    5   Closed System Drain 1 Right RUQ Bulb (JP) 19 Fr. 80  80 95 55  Closed System Drain 3 Lateral;Left Abdomen Bulb (JP) 10 Fr.    10   Closed System Drain 4 Left LUQ 10 Fr.    0   Open Drain 1 Midline Abdomen         Interval imaging/drain manipulation:  CT A/P W contrast - 11/25/23  IMPRESSION: 1. Large intraperitoneal collection in the anterior pelvis containing air and contrast most consistent with perforation likely arising from the colostomy. Surgical consult is advised. 2. Additional loculated collections/abscesses as above. 3. Small bilateral pleural effusions, decreased since the prior CT. Bibasilar subsegmental atelectasis or pneumonia. 4.  Aortic Atherosclerosis (ICD10-I70.0).    Current examination: Flushes/aspirates easily.  Insertion site unremarkable. Suture and stat lock in place. Dressed appropriately.    Labs:  CBC: Recent Labs    11/24/23 0509 11/25/23 0607 11/28/23 0549 11/30/23 0435  WBC 21.3* 22.9* 19.2* 16.4*  HGB 7.9* 7.8* 7.0* 7.5*  HCT 24.4* 23.1* 21.8* 23.9*  PLT 516* 653* 865* 945*    COAGS: Recent Labs    11/30/23 0435  INR 1.3*    BMP: Recent Labs    11/27/23 0523 11/28/23 0549 11/29/23 0630 11/30/23 0435  NA 136 137 135 136  K 4.6 4.5 4.6 4.5  CL 100 101 101 103  CO2 25 25 27 24   GLUCOSE 146* 149* 151*  122*  BUN 20 20 20 22   CALCIUM 8.3* 8.3* 8.4* 8.5*  CREATININE 0.54 0.50 0.54 0.50  GFRNONAA >60 >60 >60 >60    LIVER FUNCTION TESTS: Recent Labs    11/19/23 0516 11/23/23 0527 11/26/23 0453 11/30/23 0435  BILITOT 0.3 0.4 0.5 0.5  AST 19 15 15  14*  ALT 9 8 9 9   ALKPHOS 63 125 159* 197*  PROT 5.3* 6.1* 6.2* 7.0  ALBUMIN  1.7* 2.0* 1.8* 2.0*    Assessment and Plan:  Intra-abdominal abscesses s/p colostomy reversal: Kristin Miller is a 88 y.o. female with a history of colon cancer s/p resection with ostomy formation. She underwent colostomy reversal  secondary to ostomy site necrosis and later developed intra-abdominal abscesses for which IR placed 2 percutaneous drains.  -Minimal discharge over the past few days from drain -May consider repeat imaging if this continues   Plan: Continue TID flushes with 5 cc NS. Record output Q shift. Dressing changes QD or PRN if soiled.  Call IR APP or on call IR MD if difficulty flushing or sudden change in drain output.  Repeat imaging/possible drain injection once output < 10 mL/QD (excluding flush material). Consideration for drain removal if output is < 10 mL/QD (excluding flush material), pending discussion with the providing surgical service.  Discharge planning: Please contact IR APP or on call IR MD prior to patient d/c to ensure appropriate follow up plans are in place. Typically patient will follow up with IR clinic 10-14 days post d/c for repeat imaging/possible drain injection. IR scheduler will contact patient with date/time of appointment. Patient will need to flush drain QD with 5 cc NS, record output QD, dressing changes every 2-3 days or earlier if soiled.   IR will continue to follow - please call with questions or concerns.   Thank you for allowing our service to participate in Kristin Miller 's care.   Electronically Signed: Aldyn Toon M Ronon Ferger, PA-C 11/30/2023, 2:53 PM    I spent a total of 15 Minutes at the the  patient's bedside AND on the patient's hospital floor or unit, greater than 50% of which was counseling/coordinating care for drain follow up.

## 2023-11-30 NOTE — Progress Notes (Signed)
       CROSS COVER NOTE  NAME: Kristin Miller MRN: [id number] DOB : 1936-02-08    Concern as stated by nurse / staff   Platelets 945,000     Pertinent findings on chart review: Medical consult note reviewed from 511 reviewed     Latest Ref Rng & Units 11/30/2023    4:35 AM 11/28/2023    5:49 AM 11/25/2023    6:07 AM  CBC  WBC 4.0 - 10.5 K/uL 16.4  19.2  22.9   Hemoglobin 12.0 - 15.0 g/dL 7.5  7.0  7.8   Hematocrit 36.0 - 46.0 % 23.9  21.8  23.1   Platelets 150 - 400 K/uL 945  865  653      Assessment and  Interventions   Assessment:  Thrombophilia  Plan: Consider hematology consult in the a.m. X X

## 2023-11-30 NOTE — Plan of Care (Signed)
  Problem: Pain Managment: Goal: General experience of comfort will improve and/or be controlled Outcome: Progressing   Problem: Safety: Goal: Ability to remain free from injury will improve Outcome: Progressing   Problem: Skin Integrity: Goal: Risk for impaired skin integrity will decrease Outcome: Progressing

## 2023-11-30 NOTE — Progress Notes (Signed)
   11/30/23 1100  Spiritual Encounters  Type of Visit Initial  Care provided to: Patient  Referral source Chaplain assessment  Reason for visit Routine spiritual support  OnCall Visit No  Spiritual Framework  Presenting Themes Meaning/purpose/sources of inspiration;Values and beliefs;Courage hope and growth  Goals  Self/Personal Goals To be able to eat  Interventions  Spiritual Care Interventions Made Established relationship of care and support;Compassionate presence;Reflective listening;Encouragement  Intervention Outcomes  Outcomes Connection to spiritual care;Awareness around self/spiritual resourses;Reduced isolation  Spiritual Care Plan  Spiritual Care Issues Still Outstanding No further spiritual care needs at this time (see row info)   Patient shared she is hoping she will be able to eat sometime soon. She shared life stories about her family and loss of her husband and daughter and how God gave her a great second husband.

## 2023-12-01 DIAGNOSIS — C189 Malignant neoplasm of colon, unspecified: Secondary | ICD-10-CM | POA: Diagnosis not present

## 2023-12-01 LAB — GLUCOSE, CAPILLARY
Glucose-Capillary: 129 mg/dL — ABNORMAL HIGH (ref 70–99)
Glucose-Capillary: 136 mg/dL — ABNORMAL HIGH (ref 70–99)
Glucose-Capillary: 148 mg/dL — ABNORMAL HIGH (ref 70–99)
Glucose-Capillary: 156 mg/dL — ABNORMAL HIGH (ref 70–99)
Glucose-Capillary: 164 mg/dL — ABNORMAL HIGH (ref 70–99)

## 2023-12-01 MED ORDER — TRACE MINERALS CU-MN-SE-ZN 300-55-60-3000 MCG/ML IV SOLN
INTRAVENOUS | Status: AC
  Filled 2023-12-01: qty 582.4

## 2023-12-01 MED ORDER — IRON SUCROSE 200 MG IVPB - SIMPLE MED: 200.0000 mg | Status: DC

## 2023-12-01 NOTE — Progress Notes (Signed)
 WU:[id number]  Subjective: Did have some nausea with chickern broth but reports that even before her surgery that made her feel nauseated. No abd pain Drains output increased slightly ostomy working VSS afebrile off A/Bs   Objective: Vital signs in last 24 hours: Temp:  [97.9 F (36.6 C)-99.3 F (37.4 C)] 98.1 F (36.7 C) (05/13 0751) Pulse Rate:  [74-84] 77 (05/13 0751) Resp:  [18-20] 18 (05/13 0751) BP: (133-142)/(58-64) 142/60 (05/13 0751) SpO2:  [95 %-96 %] 95 % (05/13 0751) Weight:  [58.1 kg] 58.1 kg (05/13 0405) Last BM Date : 11/27/23  Intake/Output from previous day: 05/12 0701 - 05/13 0700 In: 1782.3 [P.O.:960; I.V.:722.3; IV Piggyback:100] Out: 3215 [Urine:2550; Drains:315; Stool:350] Intake/Output this shift: No intake/output data recorded.  Physical exam:  Debilitated Abd: soft, bilious drainage, nright seems to be more active. Drain holding suction. No peritonitis. Surgical drains are the ones controlling fistula, IR drain not doing much. Ostomy with good liquid output pink and patent   Lab Results: CBC  Recent Labs    11/30/23 0435  WBC 16.4*  HGB 7.5*  HCT 23.9*  PLT 945*   BMET Recent Labs    11/29/23 0630 11/30/23 0435  NA 135 136  K 4.6 4.5  CL 101 103  CO2 27 24  GLUCOSE 151* 122*  BUN 20 22  CREATININE 0.54 0.50  CALCIUM 8.4* 8.5*   PT/INR Recent Labs    11/30/23 0435  LABPROT 16.5*  INR 1.3*   ABG No results for input(s): "PHART", "HCO3" in the last 72 hours.  Invalid input(s): "PCO2", "PO2"  Studies/Results: No results found.  Anti-infectives: Anti-infectives (From admission, onward)    Start     Dose/Rate Route Frequency Ordered Stop   11/18/23 1015  fluconazole  (DIFLUCAN ) IVPB 200 mg        200 mg 100 mL/hr over 60 Minutes Intravenous Every 24 hours 11/18/23 0930     11/13/23 1700  Ampicillin -Sulbactam (UNASYN ) 3 g in sodium chloride  0.9 % 100 mL IVPB        3 g 200 mL/hr over 30 Minutes Intravenous Every 6 hours  11/13/23 1555 11/27/23 1239   11/13/23 0600  cefoTEtan  (CEFOTAN ) 2 g in sodium chloride  0.9 % 100 mL IVPB        2 g 200 mL/hr over 30 Minutes Intravenous On call to O.R. 11/13/23 0043 11/13/23 0954       Assessment/Plan: Contineu CLD, discussed with her that if output continues to rise may need to go back to strict NPO.Aaron Aas Advised to avoid chicken broth as it makes her nauseated. Continue surgical drains, tpn, out of bed ana mbualte.   Severa Daniels, M.D. [facility]   12/01/2023

## 2023-12-01 NOTE — Consult Note (Addendum)
 Consult Note   Patient: Kristin Miller ZHY:[id number] DOB: [birth year] DOA: 11/13/2023     18 DOS: the patient was seen and examined on 12/01/2023   Brief hospital course:  Pt. with PMH of hypertension, colon cancer, adenocarcinoma s/p resection and colostomy, right breast cancer, T-spine fracture, left shoulder BCC, admitted on 11/13/2023, with complaint of leaking of ostomy site patient was admitted by general surgery for ostomy revision, was found to have dyspnea and hypoxia.  So TRH was consulted for further management.    Assessment and Plan:   Acute hypoxic respiratory failure - Initially requiring 2 L nasal cannula.  Likely multifactorial etiology given underlying COPD as well as mild exacerbation of HFpE, atelectasis.  Now resolved  Acute exacerbation of HFpEF - Likely secondary to aggressive volume resuscitation.  Chest x-ray reviewed by prior provider noting congestion with mild pleural effusion.  Will hold on additional lasix  as now appears to be euvolemic   COPD exacerbation Appears resolved   Acute blood loss anemia - Likely secondary to surgical intervention and marrow stunting with hemodilution. Hgb has drifted to 7s, no apparent bleeding  Thrombocytosis Iron  deficiency - likely reactive as was normal prior to this hospitalization. Reviewed w/ Dr. 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 IR placed b/l drains on 4/25 for ongoing intra-abdominal fistula. Surgical culture growing candida, is currently on fluconazole  and unasyn . Ostomy has output. Currently on tpn.  As we don't yet have source control will need to continue fluconazole  for the time being. Off abx today. On TPN, diet advanced to clears  today  Adenocarcinoma of the colon - S/p robotic assisted laparoscopic partial colectomy 10/27/23.  Readmitted 4/25 as above  After discussion with primary team, hospitalist team will sign off, happy to re-consult if future needs arise.   Subjective: stable, no sig pain when doesn't move. Tolerating start of clears  Physical Exam: Vitals:   11/30/23 1954 12/01/23 0405 12/01/23 0751 12/01/23 1431  BP: (!) 141/64 (!) 133/58 (!) 142/60 138/70  Pulse: 74 84 77 83  Resp: 18 20 18  (!) 22  Temp: 98.7 F (37.1 C) 99.3 F (37.4 C) 98.1 F (36.7 C) 98.8 F (37.1 C)  TempSrc: Oral Oral    SpO2: 96% 95% 95% 97%  Weight:  58.1 kg    Height:        GENERAL:  Alert, pleasant, no acute distress  CARDIOVASCULAR:  RRR,  RESPIRATORY:  Clear to auscultation, no wheezing, rales, or rhonchi GASTROINTESTINAL:  Soft, diffusely minimally tender, stoma present, bilateral drains present, anterior surgical incisions, ostomy with brown drainage EXTREMITIES:  No LE edema bilaterally NEURO:  No new focal deficits appreciated SKIN:  No rashes noted PSYCH:  Appropriate mood and affect    Data Reviewed:  No new imaging to review  Labs: CBC: Recent Labs  Lab 11/25/23 0607 11/28/23 0549 11/30/23 0435  WBC 22.9* 19.2* 16.4*  NEUTROABS 17.5*  --   --   HGB 7.8* 7.0* 7.5*  HCT 23.1* 21.8* 23.9*  MCV 83.7 87.2 87.5  PLT 653* 865* 945*   Basic Metabolic Panel: Recent Labs  Lab 11/26/23 0453 11/27/23 0523 11/28/23 0549 11/29/23 0630 11/30/23 0435  NA 135 136 137 135 136  K 4.3  4.6 4.5 4.6 4.5  CL 99 100 101 101 103  CO2 27 25 25 27 24   GLUCOSE 140* 146* 149* 151* 122*  BUN 22 20 20 20 22   CREATININE 0.67 0.54 0.50 0.54 0.50  CALCIUM 8.3* 8.3* 8.3* 8.4* 8.5*  MG 2.2  --   --   --  2.3  PHOS 3.7  --   --   --  4.2   Liver Function Tests: Recent Labs  Lab 11/26/23 0453 11/30/23 0435  AST 15 14*  ALT 9 9  ALKPHOS 159* 197*  BILITOT 0.5 0.5  PROT 6.2* 7.0  ALBUMIN  1.8* 2.0*    CBG: Recent Labs  Lab 11/30/23 1217 11/30/23 1712 12/01/23 0002 12/01/23 0501 12/01/23 1109  GLUCAP 126* 132* 129* 156* 136*    Scheduled Meds:  acetaminophen   1,000 mg Oral Q6H   alteplase   2 mg Intracatheter Once   amLODipine   5 mg Oral Daily   Chlorhexidine  Gluconate Cloth  6 each Topical Daily   diphenoxylate -atropine   2 tablet Oral QID   enoxaparin  (LOVENOX ) injection  40 mg Subcutaneous Q24H   insulin  aspart  0-9 Units Subcutaneous Q6H   latanoprost   1 drop Both Eyes QHS   loperamide   4 mg Oral Q6H   octreotide   200 mcg Intravenous Q8H   pantoprazole  (PROTONIX ) IV  40 mg Intravenous Q12H   sodium chloride  flush  10-40 mL Intracatheter Q12H   sodium chloride  flush  5 mL Intracatheter Q8H   Continuous Infusions:  fluconazole  (DIFLUCAN ) IV 200 mg (12/01/23 0904)   iron  sucrose 200 mg (12/01/23 1124)   TPN ADULT (ION) 65 mL/hr at 11/30/23 1743   TPN ADULT (ION)     PRN Meds:.diphenhydrAMINE  **OR** diphenhydrAMINE , hydrALAZINE , ipratropium-albuterol , methocarbamol  **OR** methocarbamol  (ROBAXIN ) injection, morphine  injection, ondansetron  (ZOFRAN ) IV, mouth rinse, oxyCODONE , prochlorperazine  **OR** prochlorperazine , sodium chloride  flush   Disposition: Per primary team     Author: Raymonde Calico, MD 12/01/2023 3:39 PM  For on call review [email or web address].

## 2023-12-01 NOTE — Plan of Care (Signed)

## 2023-12-01 NOTE — Progress Notes (Signed)
 Physical Therapy Treatment Patient Details Name: Kristin Miller MRN: [id number] DOB: 29-Jul-1935 Today's Date: 12/01/2023   History of Present Illness MELONEE KENNER presented to [facility] on 4/25 for planned surgery, with the diagnosis of colostomy complication; now s/p revision. Other dx include back pain, glaucoma, HTN, COPD.    PT Comments  Pt was pleasant and motivated to participate during the session and put forth good effort throughout. Pt required cuing for log roll sequencing and min A for BLE and trunk control with rolling and sidelying to sit but grossly decreased physical assist compared to prior sessions.  Pt required cuing only during sit to/from stand transfer training from various height surfaces with good stability upon initial stand.  Pt was able to amb 2 x 12 feet this session with very slow cadence and short B step length but steady with no overt LOB including during multiple 180 deg turns.  Pt reported no adverse symptoms during the session with SpO2 and HR WNL throughout on room air.  Pt will benefit from continued PT services upon discharge to safely address deficits listed in patient problem list for decreased caregiver assistance and eventual return to PLOF.       If plan is discharge home, recommend the following: A lot of help with walking and/or transfers;A lot of help with bathing/dressing/bathroom;Help with stairs or ramp for entrance;Assist for transportation;Assistance with cooking/housework   Can travel by private vehicle     Yes  Equipment Recommendations  Rolling walker (2 wheels)    Recommendations for Other Services       Precautions / Restrictions Precautions Precautions: Fall Recall of Precautions/Restrictions: Intact Restrictions Weight Bearing Restrictions Per Provider Order: No Other Position/Activity Restrictions: Multiple drains     Mobility  Bed Mobility Overal bed mobility: Needs Assistance Bed Mobility: Rolling, Sidelying to  Sit Rolling: Supervision Sidelying to sit: Min assist       General bed mobility comments: Log roll training/review for decreased caregiver assistance and for pain control; min A for BLE and trunk control    Transfers Overall transfer level: Needs assistance Equipment used: Rolling walker (2 wheels) Transfers: Sit to/from Stand Sit to Stand: Contact guard assist           General transfer comment: Min to mod verbal cues for hand placement    Ambulation/Gait Ambulation/Gait assistance: Contact guard assist Gait Distance (Feet): 12 Feet x 2 Assistive device: Rolling walker (2 wheels) Gait Pattern/deviations: Step-through pattern, Decreased step length - right, Decreased step length - left Gait velocity: decreased     General Gait Details: Slow cadence with short B step length but steady with no overt LOB   Stairs             Wheelchair Mobility     Tilt Bed    Modified Rankin (Stroke Patients Only)       Balance Overall balance assessment: Needs assistance Sitting-balance support: Feet supported Sitting balance-Leahy Scale: Good     Standing balance support: Bilateral upper extremity supported, Reliant on assistive device for balance, During functional activity Standing balance-Leahy Scale: Fair                              Hotel manager: No apparent difficulties  Cognition Arousal: Alert Behavior During Therapy: WFL for tasks assessed/performed   PT - Cognitive impairments: No apparent impairments  Following commands: Intact      Cueing Cueing Techniques: Verbal cues, Tactile cues  Exercises Other Exercises Other Exercises: Log roll training review    General Comments General comments (skin integrity, edema, etc.): all drains/IV intact pre/post session      Pertinent Vitals/Pain Pain Assessment Pain Assessment: No/denies pain    Home Living                           Prior Function            PT Goals (current goals can now be found in the care plan section) Progress towards PT goals: Progressing toward goals    Frequency    Min 2X/week      PT Plan      Co-evaluation              AM-PAC PT "6 Clicks" Mobility   Outcome Measure  Help needed turning from your back to your side while in a flat bed without using bedrails?: A Little Help needed moving from lying on your back to sitting on the side of a flat bed without using bedrails?: A Little Help needed moving to and from a bed to a chair (including a wheelchair)?: A Little Help needed standing up from a chair using your arms (e.g., wheelchair or bedside chair)?: A Little Help needed to walk in hospital room?: A Little Help needed climbing 3-5 steps with a railing? : A Lot 6 Click Score: 17    End of Session Equipment Utilized During Treatment: Gait belt;Other (comment) (Gait belt placed under the arms to avoid drains) Activity Tolerance: Patient tolerated treatment well Patient left: in chair;with call bell/phone within reach;with chair alarm set   PT Visit Diagnosis: Muscle weakness (generalized) (M62.81);Difficulty in walking, not elsewhere classified (R26.2);Unsteadiness on feet (R26.81)     Time: 1914-7829 PT Time Calculation (min) (ACUTE ONLY): 28 min  Charges:    $Gait Training: 8-22 mins $Therapeutic Activity: 8-22 mins PT General Charges $$ ACUTE PT VISIT: 1 Visit                     D. Scott Atiyah Bauer PT, DPT 12/01/23, 2:31 PM

## 2023-12-01 NOTE — Plan of Care (Signed)
  Problem: Pain Managment: Goal: General experience of comfort will improve and/or be controlled Outcome: Progressing   Problem: Safety: Goal: Ability to remain free from injury will improve Outcome: Progressing   Problem: Skin Integrity: Goal: Risk for impaired skin integrity will decrease Outcome: Progressing

## 2023-12-01 NOTE — Progress Notes (Signed)
 Occupational Therapy Treatment Patient Details Name: Kristin Miller MRN: [id number] DOB: [birth year] Today's Date: 12/01/2023   History of present illness Kristin Miller presented to [facility] on 4/25 for planned surgery, with the diagnosis of colostomy complication; now s/p revision. Other dx include back pain, glaucoma, HTN, COPD.   OT comments  Pt is supine in bed on arrival. Pleasant and agreeable to OT session. Does not complain of pain. Pt with urgent need to urinate, opted to remove purewick and amb to toilet, however essentially required [facility] d/t urgency. CGA for all transfers and mobility during session with cueing for hand placement for STS. Supervision for seated peri-care on BSC. Edu on awareness of drains and IV to prevent any injuries. Tolerated ~15-20 feet of ambulation using RW with CGA prior to fatigue and wishing to return to supine. Supervision for supine to sit. Pt returned to bed with all needs in place and will cont to require skilled acute OT services to maximize her safety and IND to return to PLOF.       If plan is discharge home, recommend the following:  Assistance with cooking/housework;Assist for transportation;Help with stairs or ramp for entrance;A little help with bathing/dressing/bathroom;A little help with walking and/or transfers   Equipment Recommendations  Other (comment) (defer to next venue)    Recommendations for Other Services      Precautions / Restrictions Precautions Precautions: Fall Recall of Precautions/Restrictions: Intact Restrictions Weight Bearing Restrictions Per Provider Order: No Other Position/Activity Restrictions: Multiple drains       Mobility Bed Mobility Overal bed mobility: Needs Assistance Bed Mobility: Sit to Supine       Sit to supine: Supervision        Transfers Overall transfer level: Needs assistance Equipment used: Rolling walker (2 wheels) Transfers: Sit to/from Stand Sit to Stand: Contact guard assist            General transfer comment: cueing to push from recliner and BSC for STS; tolerated ~15-20 feet amb with RW prior to fatigue and wishing to return to bed     Balance Overall balance assessment: Needs assistance Sitting-balance support: Feet supported Sitting balance-Leahy Scale: Good     Standing balance support: Bilateral upper extremity supported, Reliant on assistive device for balance, During functional activity Standing balance-Leahy Scale: Fair Standing balance comment: RW use and CGA                           ADL either performed or assessed with clinical judgement   ADL Overall ADL's : Needs assistance/impaired                         Toilet Transfer: Stand-pivot;Rolling walker (2 wheels);BSC/3in1;Contact guard assist Toilet Transfer Details (indicate cue type and reason): d/t urgency used BSC vs walking to toilet Toileting- Clothing Manipulation and Hygiene: Supervision/safety;Sitting/lateral lean              Extremity/Trunk Assessment              Diplomatic Services operational officer Communication Communication: No apparent difficulties   Cognition Arousal: Alert Behavior During Therapy: WFL for tasks assessed/performed                                 Following commands: Intact  Cueing   Cueing Techniques: Verbal cues, Tactile cues  Exercises      Shoulder Instructions       General Comments all drains/IV intact pre/post session    Pertinent Vitals/ Pain       Pain Assessment Pain Assessment: No/denies pain Pain Intervention(s): Monitored during session  Home Living                                          Prior Functioning/Environment              Frequency  Min 2X/week        Progress Toward Goals  OT Goals(current goals can now be found in the care plan section)  Progress towards OT goals: Progressing toward goals  Acute Rehab  OT Goals Patient Stated Goal: improve strength OT Goal Formulation: With patient Time For Goal Achievement: 12/05/23 Potential to Achieve Goals: Good  Plan      Co-evaluation                 AM-PAC OT "6 Clicks" Daily Activity     Outcome Measure   Help from another person eating meals?: None Help from another person taking care of personal grooming?: A Little Help from another person toileting, which includes using toliet, bedpan, or urinal?: A Little Help from another person bathing (including washing, rinsing, drying)?: A Little Help from another person to put on and taking off regular upper body clothing?: A Little Help from another person to put on and taking off regular lower body clothing?: A Lot 6 Click Score: 18    End of Session Equipment Utilized During Treatment: Gait belt;Rolling walker (2 wheels)  OT Visit Diagnosis: Muscle weakness (generalized) (M62.81)   Activity Tolerance Patient tolerated treatment well   Patient Left in bed;with call bell/phone within reach;with bed alarm set   Nurse Communication Mobility status        Time: 4540-9811 OT Time Calculation (min): 25 min  Charges: OT General Charges $OT Visit: 1 Visit OT Treatments $Self Care/Home Management : 8-22 mins $Therapeutic Activity: 8-22 mins  Abdulrahman Bracey, OTR/L  12/01/23, 12:26 PM   Treysean Petruzzi E Albaraa Swingle 12/01/2023, 12:24 PM

## 2023-12-01 NOTE — Consult Note (Signed)
 WOC Nurse ostomy follow up Stoma type/location: End colostomy RLQ post revision. Stomal assessment/size: 38 mm round, red and viable, functioning. 20 ml soft stools, dark brown at 1220. Visit to do ostomy teaching section. According to the patient, she didn't have any leaking episodes. The pouch change was today during the morning. She prefers to do the teaching section on FRI.  Scheduled at 0800.  WOC team will follow on FRI. Thank-you,  Rachel Budds BSN, RN, [employer], WOC  (Pager: [phone])

## 2023-12-01 NOTE — Consult Note (Signed)
 PHARMACY - TOTAL PARENTERAL NUTRITION CONSULT NOTE   Indication: Prolonged ileus  Patient Measurements: Height: 5' (152.4 cm) Weight: 58.1 kg (128 lb 1.4 oz) IBW/kg (Calculated) : 45.5 TPN AdjBW (KG): 51.8 Body mass index is 25.02 kg/m. Usual Weight: 64.9 kg  Assessment:  NE is a 88 yo female who presented with colostomy issues w separation of mucocutaneous junction and distal necrosis post parietal colectomy due to adenocarcinoma of colon. Previously colectomy was 4/8. They underwent revision of colostomy on 4/25. Pharmacy has been consulted to manage this patient's TPN.   Glucose / Insulin : BG 126-156 5 units of novolog  in the last 24 hours,  sSSI q6h.  Electrolytes: WNL Renal: Scr < 1 Hepatic: WNL Intake / Output; net (-) 4123 mL MIVF: No MIVF at this time  Intake/Output Summary (Last 24 hours) at 12/01/2023 0707 Last data filed at 12/01/2023 0459 Gross per 24 hour  Intake 1782.33 ml  Output 3215 ml  Net -1432.67 ml    GI Imaging: 4/23: CT Abdomen Pelvis: Surgical changes from a left colectomy and left lower colostomy. The distal colostomy is abnormal. Beginning approximately 10 cm from the ostomy site the colon is very amorphous and is filled with fluid. There are abnormal gas collections around the bowel and there is a large collection of gas in the subcutaneous tissues. Findings very worrisome for necrosis of the distal 10 cm of colon. 4/30 CT abd/pel: Multiple fluid collections are noted throughout the peritoneal space, concerning for possible abscesses. 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, complicated by pertinent  comorbid conditions   --2 new drains placed yesterday 5/2 by interventional radiology in 2 new right-sided fluid collections   Central access: 11/17/23 TPN start date: 11/17/23  Nutritional Goals: Goal TPN rate is 65 mL/hr (provides 87 g of protein and 1666 kcals per day)  RD Assessment: Estimated Needs Total Energy Estimated Needs: 1600-1800kcal/day Total Protein Estimated Needs: 80-90g/day Total Fluid Estimated Needs: 1.4-1.6L/day  Current Nutrition:  NPO  Plan:  ---Continue custom TPN at goal of 65 mL/hr  ---Surgery to trial clear, liquid diet --- Electrolytes in TPN: Na 45mEq/L, K 42mEq/L, Ca 3mEq/L, Mg 5 mEq/L, and Phos 15. Cl:Ac 1:1 ---Add standard MVI and trace elements to TPN ---Continue Sensitive q6h SSI and adjust as needed  ---Chromium 10 mcg/day per dietary recommendations:  unable to provide at this time due to national backorder status, removed from TPN 11/24/23 ---Monitor TPN labs on Mon/Thurs ---F/u plan to wean TPN if patient tolerating CLD  Thank you for involving pharmacy in this patient's care.   Ananias Balls, PharmD Clinical Pharmacist 12/01/2023 7:07 AM

## 2023-12-02 LAB — GLUCOSE, CAPILLARY
Glucose-Capillary: 133 mg/dL — ABNORMAL HIGH (ref 70–99)
Glucose-Capillary: 134 mg/dL — ABNORMAL HIGH (ref 70–99)
Glucose-Capillary: 150 mg/dL — ABNORMAL HIGH (ref 70–99)
Glucose-Capillary: 151 mg/dL — ABNORMAL HIGH (ref 70–99)

## 2023-12-02 MED ORDER — POTASSIUM CHLORIDE 2 MEQ/ML IV SOLN
INTRAVENOUS | Status: AC
  Filled 2023-12-02: qty 582.4

## 2023-12-02 MED ORDER — BOOST / RESOURCE BREEZE PO LIQD CUSTOM
1.0000 | Freq: Three times a day (TID) | ORAL | Status: DC
  Administered 2023-12-02 – 2023-12-03 (×2): 1 via ORAL

## 2023-12-02 NOTE — Progress Notes (Signed)
 Occupational Therapy Treatment Patient Details Name: Kristin Miller MRN: [id number] DOB: [birth year] Today's Date: 12/02/2023   History of present illness CARLYON PRISBREY presented to [facility] on 4/25 for planned surgery, with the diagnosis of colostomy complication; now s/p revision. Other dx include back pain, glaucoma, HTN, COPD.   OT comments  Pt is supine in bed on arrival. Asleep, but easily arousable and agreeable to OT session. She reports recently returning to bed after being up in the chair most of the day and walking in the room with nursing staff. Reports lower back pain currently wishing to defer OOB activity, but agreeable to ther-ex in bed. Provided red theraband and written HEP and educated via demo of exercises with pt demonstrating good carryover to maximize her overall strength. Pt left in bed with all needs in place and will cont to require skilled acute OT services to maximize her safety and IND to return to PLOF.       If plan is discharge home, recommend the following:  Assistance with cooking/housework;Assist for transportation;Help with stairs or ramp for entrance;A little help with bathing/dressing/bathroom;A little help with walking and/or transfers   Equipment Recommendations  Other (comment) (defer)    Recommendations for Other Services      Precautions / Restrictions Precautions Precautions: Fall Recall of Precautions/Restrictions: Intact Restrictions Weight Bearing Restrictions Per Provider Order: No Other Position/Activity Restrictions: Multiple drains       Mobility Bed Mobility               General bed mobility comments: not assessed this visit    Transfers                         Balance                                           ADL either performed or assessed with clinical judgement   ADL Overall ADL's : Needs assistance/impaired                                       General ADL  Comments: deferred, pt reports being up and walking throughout the day and now has some back pain but is agreeable to bed level exercises    Extremity/Trunk Assessment              Vision       Perception     Praxis     Communication Communication Communication: No apparent difficulties   Cognition Arousal: Alert Behavior During Therapy: WFL for tasks assessed/performed                                 Following commands: Intact        Cueing   Cueing Techniques: Verbal cues, Tactile cues  Exercises Other Exercises Other Exercises: Provided red theraband and written HEP and edu pt on BUE strengthening exercises to be performed daily. Pt demo back to therapist with good carryover.    Shoulder Instructions       General Comments      Pertinent Vitals/ Pain       Pain Assessment Pain Assessment: No/denies pain  Home Living  Prior Functioning/Environment              Frequency  Min 2X/week        Progress Toward Goals  OT Goals(current goals can now be found in the care plan section)  Progress towards OT goals: Progressing toward goals  Acute Rehab OT Goals Patient Stated Goal: improve strength OT Goal Formulation: With patient Time For Goal Achievement: 12/05/23 Potential to Achieve Goals: Good  Plan      Co-evaluation                 AM-PAC OT "6 Clicks" Daily Activity     Outcome Measure   Help from another person eating meals?: None Help from another person taking care of personal grooming?: A Little Help from another person toileting, which includes using toliet, bedpan, or urinal?: A Little Help from another person bathing (including washing, rinsing, drying)?: A Little Help from another person to put on and taking off regular upper body clothing?: A Little Help from another person to put on and taking off regular lower body clothing?: A Lot 6 Click  Score: 18    End of Session    OT Visit Diagnosis: Muscle weakness (generalized) (M62.81)   Activity Tolerance Patient tolerated treatment well   Patient Left in bed;with call bell/phone within reach;with bed alarm set   Nurse Communication Mobility status        Time: 1439-1500 OT Time Calculation (min): 21 min  Charges: OT General Charges $OT Visit: 1 Visit OT Treatments $Therapeutic Exercise: 8-22 mins  Annalina Needles, OTR/L  12/02/23, 4:20 PM   Kayden Hutmacher E Audray Rumore 12/02/2023, 4:18 PM

## 2023-12-02 NOTE — Progress Notes (Signed)
 [facility] SURGICAL PROGRESS NOTE  Hospital Day(s): 19.   Post op day(s): 19 Days Post-Op.   Interval History:  Patient seen and examined No acute events or new complaints overnight.  Patient reports she is doing well considering No further nausea, emesis, fever, chills No new labs this morning Drains as follows...             - LUQ: 10 ccs; bilious             - RUQ: 135 ccs; bilious   - IR Drains; 0 ccs  She is on CLD Colostomy; with liquid in bag She is ambulating/working with therapies   Vital signs in last 24 hours: [min-max] current  Temp:  [98.1 F (36.7 C)-99.2 F (37.3 C)] 98.7 F (37.1 C) (05/14 0422) Pulse Rate:  [75-83] 75 (05/14 0422) Resp:  [16-22] 20 (05/14 0422) BP: (138-147)/(60-70) 147/66 (05/14 0422) SpO2:  [94 %-97 %] 95 % (05/14 0422)     Height: 5' (152.4 cm) Weight: 58.1 kg BMI (Calculated): 25.02   Intake/Output last 2 shifts:  05/13 0701 - 05/14 0700 In: 1644.3 [I.V.:1529.3; IV Piggyback:110] Out: 545 [Urine:250; Drains:145; Stool:150]   Physical Exam:  Constitutional: alert, cooperative and no distress  Respiratory: breathing non-labored at rest  Cardiovascular: regular rate and sinus rhythm  Gastrointestinal: soft, non-tender, and non-distended. Colostomy in right abdomen; dark liquid in bag, drains x4 present; output primarily from surgical drain; bilious vs sero-feculent   Labs:     Latest Ref Rng & Units 11/30/2023    4:35 AM 11/28/2023    5:49 AM 11/25/2023    6:07 AM  CBC  WBC 4.0 - 10.5 K/uL 16.4  19.2  22.9   Hemoglobin 12.0 - 15.0 g/dL 7.5  7.0  7.8   Hematocrit 36.0 - 46.0 % 23.9  21.8  23.1   Platelets 150 - 400 K/uL 945  865  653       Latest Ref Rng & Units 11/30/2023    4:35 AM 11/29/2023    6:30 AM 11/28/2023    5:49 AM  CMP  Glucose 70 - 99 mg/dL 119  147  829   BUN 8 - 23 mg/dL 22  20  20    Creatinine 0.44 - 1.00 mg/dL 5.62  1.30  8.65   Sodium 135 - 145 mmol/L 136  135  137   Potassium 3.5 - 5.1  mmol/L 4.5  4.6  4.5   Chloride 98 - 111 mmol/L 103  101  101   CO2 22 - 32 mmol/L 24  27  25    Calcium 8.9 - 10.3 mg/dL 8.5  8.4  8.3   Total Protein 6.5 - 8.1 g/dL 7.0     Total Bilirubin 0.0 - 1.2 mg/dL 0.5     Alkaline Phos 38 - 126 U/L 197     AST 15 - 41 U/L 14     ALT 0 - 44 U/L 9        Imaging studies: No new pertinent imaging studies   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, 5 Days Post-Op s/p revision of end colostomy, partial colectomy, and repair of parastomal hernia for necrosis of previous end colostomy    - Okay to continue CLD for now - closely monitor drain output, improved today  - Continue TPN; nutritional labs biweekly - Continue drains; monitor and record output strictly - Continue octreotide , loperamide  to decrease bowel secretions    - Monitor  colostomy output; WOC on board - appreciate help   - Pain control prn; antiemetics prn   - Mobilize as tolerated; PT/OT on board    - Appreciate medicine assistance as well   All of the above findings and recommendations were discussed with the patient, patient's family (husband at bedside), and the medical team, and all of patient's and family's questions were answered to their expressed satisfaction.  -- Apolonio Bay, PA-C [facility] 12/02/2023, 7:24 AM M-F: 7am - 4pm

## 2023-12-02 NOTE — Plan of Care (Signed)

## 2023-12-02 NOTE — Plan of Care (Signed)
  Problem: Education: Goal: Knowledge of General Education information will improve Description: Including pain rating scale, medication(s)/side effects and non-pharmacologic comfort measures 12/02/2023 0512 by Zack Hero, RN Outcome: Progressing 12/02/2023 0511 by Zack Hero, RN Outcome: Progressing   Problem: Clinical Measurements: Goal: Ability to maintain clinical measurements within normal limits will improve 12/02/2023 0512 by Zack Hero, RN Outcome: Progressing 12/02/2023 0511 by Zack Hero, RN Outcome: Progressing Goal: Will remain free from infection 12/02/2023 0512 by Zack Hero, RN Outcome: Progressing 12/02/2023 0511 by Zack Hero, RN Outcome: Progressing Goal: Diagnostic test results will improve 12/02/2023 0512 by Zack Hero, RN Outcome: Progressing 12/02/2023 0511 by Zack Hero, RN Outcome: Progressing Goal: Respiratory complications will improve 12/02/2023 0512 by Zack Hero, RN Outcome: Progressing 12/02/2023 0511 by Zack Hero, RN Outcome: Progressing Goal: Cardiovascular complication will be avoided 12/02/2023 0512 by Zack Hero, RN Outcome: Progressing 12/02/2023 0511 by Zack Hero, RN Outcome: Progressing   Problem: Health Behavior/Discharge Planning: Goal: Ability to manage health-related needs will improve 12/02/2023 0512 by Zack Hero, RN Outcome: Progressing 12/02/2023 0511 by Zack Hero, RN Outcome: Progressing

## 2023-12-02 NOTE — Consult Note (Signed)
 PHARMACY - TOTAL PARENTERAL NUTRITION CONSULT NOTE   Indication: Prolonged ileus  Patient Measurements: Height: 5' (152.4 cm) Weight: 58.1 kg (128 lb 1.4 oz) IBW/kg (Calculated) : 45.5 TPN AdjBW (KG): 51.8 Body mass index is 25.02 kg/m. Usual Weight: 64.9 kg  Assessment:  NE is a 88 yo female who presented with colostomy issues w separation of mucocutaneous junction and distal necrosis post parietal colectomy due to adenocarcinoma of colon. Previously colectomy was 4/8. They underwent revision of colostomy on 4/25. Pharmacy has been consulted to manage this patient's TPN.   Glucose / Insulin : BG 136-156 6 units of novolog  in the last 24 hours,  sSSI q6h.  Electrolytes: WNL Renal: Scr < 1 Hepatic: WNL Intake / Output; net (-) 5407 mL MIVF: No MIVF at this time  Intake/Output Summary (Last 24 hours) at 12/02/2023 0906 Last data filed at 12/02/2023 0601 Gross per 24 hour  Intake 1644.32 ml  Output 295 ml  Net 1349.32 ml    GI Imaging: 4/23: CT Abdomen Pelvis: Surgical changes from a left colectomy and left lower colostomy. The distal colostomy is abnormal. Beginning approximately 10 cm from the ostomy site the colon is very amorphous and is filled with fluid. There are abnormal gas collections around the bowel and there is a large collection of gas in the subcutaneous tissues. Findings very worrisome for necrosis of the distal 10 cm of colon. 4/30 CT abd/pel: Multiple fluid collections are noted throughout the peritoneal space, concerning for possible abscesses. 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, complicated by pertinent  comorbid conditions   --2 new drains placed yesterday 5/2 by interventional radiology in 2 new right-sided fluid collections   Central access: 11/17/23 TPN start date: 11/17/23  Nutritional Goals: Goal TPN rate is 65 mL/hr (provides 87 g of protein and 1666 kcals per day)  RD Assessment: Estimated Needs Total Energy Estimated Needs: 1600-1800kcal/day Total Protein Estimated Needs: 80-90g/day Total Fluid Estimated Needs: 1.4-1.6L/day  Current Nutrition:  NPO  Plan:  ---Continue custom TPN at goal of 65 mL/hr  ---Surgery to trial clear, liquid diet --- Electrolytes in TPN: Na 7mEq/L, K 22mEq/L, Ca 82mEq/L, Mg 5 mEq/L, and Phos 15. Cl:Ac 1:1 ---Add standard MVI and trace elements to TPN ---Continue Sensitive q6h SSI and adjust as needed  ---Chromium 10 mcg/day per dietary recommendations:  unable to provide at this time due to national backorder status, removed from TPN 11/24/23 ---Monitor TPN labs on Mon/Thurs ---F/u plan to wean TPN if patient tolerating CLD  Thank you for involving pharmacy in this patient's care.   Ananias Balls, PharmD Clinical Pharmacist 12/02/2023 9:06 AM

## 2023-12-02 NOTE — Progress Notes (Signed)
 Nutrition Follow-up  DOCUMENTATION CODES:   Not applicable  INTERVENTION:   Continue TPN per pharmacy  Recommend chromium 10mcg daily added to TPN (on hold r/t backorder)  Boost Breeze po TID, each supplement provides 250 kcal and 9 grams of protein  Daily weights   NUTRITION DIAGNOSIS:   Inadequate oral intake related to altered GI function as evidenced by NPO status. -ongoing   GOAL:   Patient will meet greater than or equal to 90% of their needs -met   MONITOR:   PO intake, Supplement acceptance, Diet advancement, Labs, Weight trends, Skin, I & O's  ASSESSMENT:   88 y/o female with h/o HTN, breast cancer s/p lumpectomy/XRT, IDA, HTN, chronic back pain, colon cancer (s/p open low anterior resection with end colostomy, abdominal wall resection to include psoas and left transversus abdominus muscles and left oophorectomy 07/2022 & s/p XRT) complicated by SBO (medically managed 07/2022) and flank hernia (s/p robotic assisted laparoscopic left colectomy w/ end colostomy, repair parastomal hernia and repair enterotomy 10/27/2023) and ostomy necrosis s/p revision of end colostomy requiring partial colectomy and resiting of the end colostomy to the right of the abdominal wall & repair of parastomal hernia 4/25 complicated by fluid collections and 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  Pt s/p IR drain placement x 2 on 5/2  Met with pt in room today. Pt in good spirits. Pt reports that she is feeling better. Pt initiated on a clear liquid diet 5/12. Pt reports that she does not really like broth but she is trying to eat some of it; pt reports that "it went down my windpipe" earlier today. Pt has been drinking some juice and eating some popcicles. Pt reports that she is taking it slow. Pt is agreeable to [employer]. Pt is tolerating TPN at goal rate. Refeed labs stable. Per chart, pt is weight stable since admission. Pt looks good on exam today.   Medications reviewed and include: lovenox , insulin ,  imodium , octroetide, protonix , diflucan , iron  sucrose, TPN  Labs reviewed: K 4.5 wnl, P 4.2 wnl, Mg 2.3 wnl Triglycerides 105- 5/12 Wbc- 16.4(H), Hgb 7.5(L), Hct 23.9(L) Cbgs- 150, 133, 151 x 24 hrs   Drains-   UOP-  Nutrition Focused Physical Exam:  Flowsheet Row Most Recent Value  Orbital Region No depletion  Upper Arm Region No depletion  Thoracic and Lumbar Region No depletion  Buccal Region No depletion  Temple Region No depletion  Clavicle Bone Region No depletion  Clavicle and Acromion Bone Region No depletion  Scapular Bone Region No depletion  Dorsal Hand Mild depletion  Patellar Region Mild depletion  Anterior Thigh Region No depletion  Posterior Calf Region Mild depletion  Edema (RD Assessment) None  Hair Reviewed  Eyes Reviewed  Mouth Reviewed  Skin Reviewed  Nails Reviewed   Diet Order:   Diet Order             Diet clear liquid Room service appropriate? Yes; Fluid consistency: Thin  Diet effective now                  EDUCATION NEEDS:   Education needs have been addressed  Skin:  Skin Assessment: Reviewed RN Assessment  Last BM:  5/14- via ostomy  Height:   Ht Readings from Last 1 Encounters:  11/20/23 5' (1.524 m)    Weight:   Wt Readings from Last 1 Encounters:  12/01/23 58.1 kg   BMI:  Body mass index is 25.02 kg/m.  Estimated Nutritional Needs:  Kcal:  1600-1800kcal/day  Protein:  80-90g/day  Fluid:  1.4-1.6L/day  Torrance Freestone MS, RD, LDN If unable to be reached, please send secure chat to "RD inpatient" available from 8:00a-4:00p daily

## 2023-12-02 NOTE — Progress Notes (Signed)
 Physical Therapy Treatment Patient Details Name: Kristin Miller MRN: [id number] DOB: 20-Nov-1935 Today's Date: 12/02/2023   History of Present Illness Kristin Miller presented to [facility] on 4/25 for planned surgery, with the diagnosis of colostomy complication; now s/p revision. Other dx include back pain, glaucoma, HTN, COPD.    PT Comments  Pt was pleasant and motivated to participate during the session and put forth good effort throughout. Pt demonstrated fair carryover of log roll sequencing with only minimal assist needed for trunk control to come to full upright sitting position.  Pt demonstrated good static sitting balance and was able to perform sit to/from stand transfers from various surfaces with good control with only min cuing for hand placement.  Pt was able to increase her max self-selected walking distance to 30 feet this session before becoming fatigued and needing to return to sitting but with no adverse symptoms other than mild abdominal pain.  Pt will benefit from continued PT services upon discharge to safely address deficits listed in patient problem list for decreased caregiver assistance and eventual return to PLOF.      If plan is discharge home, recommend the following: A lot of help with walking and/or transfers;A lot of help with bathing/dressing/bathroom;Help with stairs or ramp for entrance;Assist for transportation;Assistance with cooking/housework   Can travel by private vehicle     Yes  Equipment Recommendations  Rolling walker (2 wheels)    Recommendations for Other Services       Precautions / Restrictions Precautions Precautions: Fall Recall of Precautions/Restrictions: Intact Restrictions Weight Bearing Restrictions Per Provider Order: No Other Position/Activity Restrictions: Multiple drains     Mobility  Bed Mobility Overal bed mobility: Needs Assistance Bed Mobility: Rolling, Sidelying to Sit Rolling: Supervision Sidelying to sit: Min  assist       General bed mobility comments: Very minimal assist needed with log roll sequencing this session, mostly for trunk control to full upright position    Transfers Overall transfer level: Needs assistance Equipment used: Rolling walker (2 wheels) Transfers: Sit to/from Stand Sit to Stand: Contact guard assist           General transfer comment: Min to mod verbal cues for hand placement with good control and stability    Ambulation/Gait Ambulation/Gait assistance: Contact guard assist Gait Distance (Feet): 30 Feet Assistive device: Rolling walker (2 wheels) Gait Pattern/deviations: Step-through pattern, Decreased step length - right, Decreased step length - left Gait velocity: decreased     General Gait Details: Slow cadence with short B step length but steady with no overt LOB with improved activity tolerance this session   Stairs             Wheelchair Mobility     Tilt Bed    Modified Rankin (Stroke Patients Only)       Balance Overall balance assessment: Needs assistance Sitting-balance support: Feet supported Sitting balance-Leahy Scale: Good     Standing balance support: Bilateral upper extremity supported, Reliant on assistive device for balance, During functional activity Standing balance-Leahy Scale: Good                              Communication Communication Communication: No apparent difficulties  Cognition Arousal: Alert Behavior During Therapy: WFL for tasks assessed/performed   PT - Cognitive impairments: No apparent impairments  Following commands: Intact      Cueing Cueing Techniques: Verbal cues, Tactile cues  Exercises Other Exercises Other Exercises: Log roll training/review    General Comments        Pertinent Vitals/Pain Pain Assessment Pain Assessment: 0-10 Pain Score: 2  Pain Location: abdomen Pain Descriptors / Indicators: Sore Pain Intervention(s):  Repositioned, Premedicated before session, Monitored during session    Home Living                          Prior Function            PT Goals (current goals can now be found in the care plan section) Progress towards PT goals: Progressing toward goals    Frequency    Min 2X/week      PT Plan      Co-evaluation              AM-PAC PT "6 Clicks" Mobility   Outcome Measure  Help needed turning from your back to your side while in a flat bed without using bedrails?: A Little Help needed moving from lying on your back to sitting on the side of a flat bed without using bedrails?: A Little Help needed moving to and from a bed to a chair (including a wheelchair)?: A Little Help needed standing up from a chair using your arms (e.g., wheelchair or bedside chair)?: A Little Help needed to walk in hospital room?: A Little Help needed climbing 3-5 steps with a railing? : A Lot 6 Click Score: 17    End of Session Equipment Utilized During Treatment: Gait belt;Other (comment) (Gait belt placed under the arms to avoid drains) Activity Tolerance: Patient tolerated treatment well Patient left: in chair;with call bell/phone within reach;with chair alarm set Nurse Communication: Mobility status PT Visit Diagnosis: Muscle weakness (generalized) (M62.81);Difficulty in walking, not elsewhere classified (R26.2);Unsteadiness on feet (R26.81)     Time: 1610-9604 PT Time Calculation (min) (ACUTE ONLY): 29 min  Charges:    $Gait Training: 8-22 mins $Therapeutic Activity: 8-22 mins PT General Charges $$ ACUTE PT VISIT: 1 Visit                     D. Scott Fairy Ashlock PT, DPT 12/02/23, 5:25 PM

## 2023-12-02 NOTE — Progress Notes (Signed)
   12/02/23 1115  Spiritual Encounters  Type of Visit Follow up  Care provided to: Patient  Referral source Chaplain assessment  Reason for visit Routine spiritual support  OnCall Visit No  Spiritual Framework  Presenting Themes Meaning/purpose/sources of inspiration  Interventions  Spiritual Care Interventions Made Compassionate presence;Reflective listening;Narrative/life review  Intervention Outcomes  Outcomes Reduced isolation  Spiritual Care Plan  Spiritual Care Issues Still Outstanding No further spiritual care needs at this time (see row info)   Patient stated she doesn't really like the bouillon because it's too strong and chaplain suggested she ask cafe services to bring a cup of hot water  so she can dilute the bouillon. Chaplain also made nurse aware as well.

## 2023-12-03 LAB — GLUCOSE, CAPILLARY
Glucose-Capillary: 111 mg/dL — ABNORMAL HIGH (ref 70–99)
Glucose-Capillary: 135 mg/dL — ABNORMAL HIGH (ref 70–99)
Glucose-Capillary: 152 mg/dL — ABNORMAL HIGH (ref 70–99)

## 2023-12-03 LAB — COMPREHENSIVE METABOLIC PANEL WITH GFR
ALT: 11 U/L (ref 0–44)
AST: 13 U/L — ABNORMAL LOW (ref 15–41)
Albumin: 2 g/dL — ABNORMAL LOW (ref 3.5–5.0)
Alkaline Phosphatase: 189 U/L — ABNORMAL HIGH (ref 38–126)
Anion gap: 9 (ref 5–15)
BUN: 20 mg/dL (ref 8–23)
CO2: 26 mmol/L (ref 22–32)
Calcium: 8.5 mg/dL — ABNORMAL LOW (ref 8.9–10.3)
Chloride: 101 mmol/L (ref 98–111)
Creatinine, Ser: 0.51 mg/dL (ref 0.44–1.00)
GFR, Estimated: >60 mL/min (ref >60)
Glucose, Bld: 148 mg/dL — ABNORMAL HIGH (ref 70–99)
Potassium: 4.5 mmol/L (ref 3.5–5.1)
Sodium: 136 mmol/L (ref 135–145)
Total Bilirubin: 0.4 mg/dL (ref 0.0–1.2)
Total Protein: 7.1 g/dL (ref 6.5–8.1)

## 2023-12-03 LAB — CBC
HCT: 26 % — ABNORMAL LOW (ref 36.0–46.0)
Hemoglobin: 8.1 g/dL — ABNORMAL LOW (ref 12.0–15.0)
MCH: 27.6 pg (ref 26.0–34.0)
MCHC: 31.2 g/dL (ref 30.0–36.0)
MCV: 88.4 fL (ref 80.0–100.0)
Platelets: 815 10*3/uL — ABNORMAL HIGH (ref 150–400)
RBC: 2.94 MIL/uL — ABNORMAL LOW (ref 3.87–5.11)
RDW: 16.7 % — ABNORMAL HIGH (ref 11.5–15.5)
WBC: 11.9 10*3/uL — ABNORMAL HIGH (ref 4.0–10.5)
nRBC: 0 % (ref 0.0–0.2)

## 2023-12-03 LAB — MAGNESIUM: Magnesium: 2 mg/dL (ref 1.7–2.4)

## 2023-12-03 LAB — PHOSPHORUS: Phosphorus: 3.5 mg/dL (ref 2.5–4.6)

## 2023-12-03 MED ORDER — TRACE MINERALS CU-MN-SE-ZN 300-55-60-3000 MCG/ML IV SOLN
INTRAVENOUS | Status: AC
  Filled 2023-12-03: qty 582.4

## 2023-12-03 MED ORDER — ENSURE ENLIVE PO LIQD
237.0000 mL | Freq: Three times a day (TID) | ORAL | Status: DC
Start: 2023-12-03 — End: 2023-12-15
  Administered 2023-12-03 – 2023-12-15 (×25): 237 mL via ORAL

## 2023-12-03 NOTE — TOC Progression Note (Signed)
 Transition of Care [facility]) - Progression Note    Patient Details  Name: Kristin Miller MRN: [id number] Date of Birth: April 23, 1936  [facility]) CM/SW Contact  Loman Risk, RN Phone Number: 12/03/2023, 9:28 AM  Clinical Narrative:    Patient still with TPN Advanced to Full liquids  Message sent to surgery to anticipate when she will medically be ready for discharge in order to resend out SNF referral        Expected Discharge Plan and Services                                               Social Determinants of Health (SDOH) Interventions SDOH Screenings   Food Insecurity: No Food Insecurity (11/14/2023)  Housing: Low Risk  (11/24/2023)  Transportation Needs: No Transportation Needs (11/14/2023)  Utilities: Not At Risk (11/14/2023)  Alcohol Screen: Low Risk  (09/25/2021)  Depression (PHQ2-9): Low Risk  (08/25/2023)  Financial Resource Strain: Low Risk  (01/09/2023)   Received from [facility], [facility]  Physical Activity: Inactive (09/25/2021)  Social Connections: Moderately Integrated (11/14/2023)  Stress: No Stress Concern Present (09/25/2021)  Tobacco Use: Low Risk  (11/13/2023)    Readmission Risk Interventions    11/20/2023    8:57 AM  Readmission Risk Prevention Plan  Transportation Screening Complete  PCP or Specialist Appt within 5-7 Days Complete  Medication Review (RN CM) Complete

## 2023-12-03 NOTE — Consult Note (Signed)
 PHARMACY - TOTAL PARENTERAL NUTRITION CONSULT NOTE   Indication: Prolonged ileus  Patient Measurements: Height: 5' (152.4 cm) Weight: 60.5 kg (133 lb 6.1 oz) IBW/kg (Calculated) : 45.5 TPN AdjBW (KG): 51.8 Body mass index is 26.05 kg/m. Usual Weight: 64.9 kg  Assessment:  NE is a 88 yo female who presented with colostomy issues w separation of mucocutaneous junction and distal necrosis post parietal colectomy due to adenocarcinoma of colon. Previously colectomy was 4/8. They underwent revision of colostomy on 4/25. Pharmacy has been consulted to manage this patient's TPN.   Glucose / Insulin : BG 133-152 2 units of novolog  in the last 24 hours, sSSI q6h.  Electrolytes: WNL Renal: Scr < 1 Hepatic: WNL Intake / Output; net (-) 6394 mL MIVF: No MIVF at this time   Intake/Output Summary (Last 24 hours) at 12/03/2023 0833 Last data filed at 12/03/2023 0003 Gross per 24 hour  Intake 485 ml  Output 940 ml  Net -455 ml    GI Imaging: 4/23: CT Abdomen Pelvis: Surgical changes from a left colectomy and left lower colostomy. The distal colostomy is abnormal. Beginning approximately 10 cm from the ostomy site the colon is very amorphous and is filled with fluid. There are abnormal gas collections around the bowel and there is a large collection of gas in the subcutaneous tissues. Findings very worrisome for necrosis of the distal 10 cm of colon. 4/30 CT abd/pel: Multiple fluid collections are noted throughout the peritoneal space, concerning for possible abscesses. 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, complicated by pertinent comorbid  conditions   --2 new drains placed yesterday 5/2 by interventional radiology in 2 new right-sided fluid collections   Central access: 11/17/23 TPN start date: 11/17/23  Nutritional Goals: Goal TPN rate is 65 mL/hr (provides 87 g of protein and 1666 kcals per day)  RD Assessment: Estimated Needs Total Energy Estimated Needs: 1600-1800kcal/day Total Protein Estimated Needs: 80-90g/day Total Fluid Estimated Needs: 1.4-1.6L/day  Current Nutrition:  NPO  Plan:  ---Continue custom TPN at goal of 65 mL/hr  ---Surgery to advance to FLD today --- Electrolytes in TPN: Na 15mEq/L, K 52mEq/L, Ca 107mEq/L, Mg 5 mEq/L, and Phos 15. Cl:Ac 1:1 ---Add standard MVI and trace elements to TPN ---Continue Sensitive q6h SSI and adjust as needed  ---Chromium 10 mcg/day per dietary recommendations:  unable to provide at this time due to national backorder status, removed from TPN 11/24/23 ---Monitor TPN labs on Mon/Thurs  Thank you for involving pharmacy in this patient's care.   Ananias Balls, PharmD Clinical Pharmacist 12/03/2023 8:33 AM

## 2023-12-03 NOTE — Plan of Care (Signed)

## 2023-12-03 NOTE — Progress Notes (Addendum)
 [facility] SURGICAL PROGRESS NOTE  Hospital Day(s): 20.   Post op day(s): 20 Days Post-Op.   Interval History:  Patient seen and examined No acute events or new complaints overnight.  Patient reports she had a good day yesterday; some nausea overnight (now resolved) No emesis, fever, chills Leukocytosis improving; WBC 11.9K this AM Hgb to 8.1; improved Renal function normal; sCr - 0.51; UO - 700 ccs No electrolyte derangements.  Drains as follows...             - LUQ: 0 ccs             - RUQ: 140 ccs; more feculent   - IR Drains; 0 ccs  She is on CLD Colostomy; with stool and gas  She is ambulating/working with therapies   Vital signs in last 24 hours: [min-max] current  Temp:  [98.2 F (36.8 C)-99.7 F (37.6 C)] 98.8 F (37.1 C) (05/15 0350) Pulse Rate:  [72-80] 76 (05/15 0350) Resp:  [16-18] 18 (05/15 0350) BP: (117-141)/(56-65) 138/60 (05/15 0350) SpO2:  [96 %-97 %] 97 % (05/15 0350) Weight:  [60.5 kg] 60.5 kg (05/15 0500)     Height: 5' (152.4 cm) Weight: 60.5 kg BMI (Calculated): 26.05   Intake/Output last 2 shifts:  05/14 0701 - 05/15 0700 In: 485 [P.O.:480] Out: 940 [Urine:700; Drains:140; Stool:100]   Physical Exam:  Constitutional: alert, cooperative and no distress  Respiratory: breathing non-labored at rest  Cardiovascular: regular rate and sinus rhythm  Gastrointestinal: soft, non-tender, and non-distended. Colostomy in right abdomen; gas and dark liquid in bag, drains x4 present; output primarily from surgical drain in right abdomen; sero-feculent   Labs:     Latest Ref Rng & Units 12/03/2023    4:55 AM 11/30/2023    4:35 AM 11/28/2023    5:49 AM  CBC  WBC 4.0 - 10.5 K/uL 11.9  16.4  19.2   Hemoglobin 12.0 - 15.0 g/dL 8.1  7.5  7.0   Hematocrit 36.0 - 46.0 % 26.0  23.9  21.8   Platelets 150 - 400 K/uL 815  945  865       Latest Ref Rng & Units 12/03/2023    4:55 AM 11/30/2023    4:35 AM 11/29/2023    6:30 AM  CMP  Glucose 70 -  99 mg/dL 161  096  045   BUN 8 - 23 mg/dL 20  22  20    Creatinine 0.44 - 1.00 mg/dL 4.09  8.11  9.14   Sodium 135 - 145 mmol/L 136  136  135   Potassium 3.5 - 5.1 mmol/L 4.5  4.5  4.6   Chloride 98 - 111 mmol/L 101  103  101   CO2 22 - 32 mmol/L 26  24  27    Calcium 8.9 - 10.3 mg/dL 8.5  8.5  8.4   Total Protein 6.5 - 8.1 g/dL 7.1  7.0    Total Bilirubin 0.0 - 1.2 mg/dL 0.4  0.5    Alkaline Phos 38 - 126 U/L 189  197    AST 15 - 41 U/L 13  14    ALT 0 - 44 U/L 11  9       Imaging studies: No new pertinent imaging studies   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, 20 Days Post-Op s/p revision of end colostomy, partial colectomy, and repair of parastomal hernia for necrosis of previous end colostomy    - We can advance  to FLD this AM - Continue TPN; nutritional labs biweekly - Continue drains; monitor and record output strictly - Continue octreotide , loperamide  to decrease bowel secretions    - Monitor colostomy output; WOC on board - appreciate help   - Pain control prn; antiemetics prn   - Mobilize as tolerated; PT/OT on board    - Appreciate medicine assistance as well   All of the above findings and recommendations were discussed with the patient, patient's family (husband at bedside), and the medical team, and all of patient's and family's questions were answered to their expressed satisfaction.  -- Apolonio Bay, PA-C [facility] 12/03/2023, 7:22 AM M-F: 7am - 4pm

## 2023-12-04 LAB — GLUCOSE, CAPILLARY
Glucose-Capillary: 146 mg/dL — ABNORMAL HIGH (ref 70–99)
Glucose-Capillary: 132 mg/dL — ABNORMAL HIGH (ref 70–99)
Glucose-Capillary: 124 mg/dL — ABNORMAL HIGH (ref 70–99)
Glucose-Capillary: 159 mg/dL — ABNORMAL HIGH (ref 70–99)
Glucose-Capillary: 148 mg/dL — ABNORMAL HIGH (ref 70–99)

## 2023-12-04 MED ORDER — TRACE MINERALS CU-MN-SE-ZN 300-55-60-3000 MCG/ML IV SOLN
INTRAVENOUS | Status: AC
  Filled 2023-12-04: qty 582.4

## 2023-12-04 NOTE — Progress Notes (Signed)
 [facility] SURGICAL PROGRESS NOTE  Hospital Day(s): 21.   Post op day(s): 21 Days Post-Op.   Interval History:  Patient seen and examined No acute events or new complaints overnight.  Patient reports she is doing well; had a good morning No emesis, fever, chills No new labs  Drains as follows...             - LUQ: 0 ccs              - RUQ: 50 ccs; sero-feculent   - IR Drains; 0 ccs  Please note, there is 450 ccs recorded from left drains however these have no fluid in them and have not needed emptied, this is likely an error.  She is on FLD Colostomy; with stool and gas She is ambulating/working with therapies   Vital signs in last 24 hours: [min-max] current  Temp:  [98.2 F (36.8 C)-100.3 F (37.9 C)] 98.3 F (36.8 C) (05/16 0353) Pulse Rate:  [69-85] 69 (05/16 0353) Resp:  [16-20] 20 (05/16 0353) BP: (129-157)/(59-73) 136/59 (05/16 0353) SpO2:  [96 %-97 %] 96 % (05/16 0353) Weight:  [57.4 kg] 57.4 kg (05/16 0358)     Height: 5' (152.4 cm) Weight: 57.4 kg BMI (Calculated): 24.71   Intake/Output last 2 shifts:  05/15 0701 - 05/16 0700 In: -  Out: 1350 [Urine:700; Drains:500; Stool:150]   Physical Exam:  Constitutional: alert, cooperative and no distress  Respiratory: breathing non-labored at rest  Cardiovascular: regular rate and sinus rhythm  Gastrointestinal: soft, non-tender, and non-distended. Colostomy in right abdomen; gas and dark liquid in bag, drains x4 present; output primarily from surgical drain in right abdomen; sero-feculent   Labs:     Latest Ref Rng & Units 12/03/2023    4:55 AM 11/30/2023    4:35 AM 11/28/2023    5:49 AM  CBC  WBC 4.0 - 10.5 K/uL 11.9  16.4  19.2   Hemoglobin 12.0 - 15.0 g/dL 8.1  7.5  7.0   Hematocrit 36.0 - 46.0 % 26.0  23.9  21.8   Platelets 150 - 400 K/uL 815  945  865       Latest Ref Rng & Units 12/03/2023    4:55 AM 11/30/2023    4:35 AM 11/29/2023    6:30 AM  CMP  Glucose 70 - 99 mg/dL 161  096  045    BUN 8 - 23 mg/dL 20  22  20    Creatinine 0.44 - 1.00 mg/dL 4.09  8.11  9.14   Sodium 135 - 145 mmol/L 136  136  135   Potassium 3.5 - 5.1 mmol/L 4.5  4.5  4.6   Chloride 98 - 111 mmol/L 101  103  101   CO2 22 - 32 mmol/L 26  24  27    Calcium 8.9 - 10.3 mg/dL 8.5  8.5  8.4   Total Protein 6.5 - 8.1 g/dL 7.1  7.0    Total Bilirubin 0.0 - 1.2 mg/dL 0.4  0.5    Alkaline Phos 38 - 126 U/L 189  197    AST 15 - 41 U/L 13  14    ALT 0 - 44 U/L 11  9       Imaging studies: No new pertinent imaging studies   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  - Continue drains; monitor and record output strictly - may be able to start removal of left sided drains soon  - Continue octreotide , loperamide  to decrease bowel secretions    - Monitor colostomy output; WOC on board - appreciate help   - Pain control prn; antiemetics prn   - Mobilize as tolerated; PT/OT on board    - Appreciate medicine assistance as well   All of the above findings and recommendations were discussed with the patient, patient's family (husband at bedside), and the medical team, and all of patient's and family's questions were answered to their expressed satisfaction.  -- Apolonio Bay, PA-C [facility] 12/04/2023, 7:24 AM M-F: 7am - 4pm

## 2023-12-04 NOTE — TOC Progression Note (Signed)
 Transition of Care [facility]) - Progression Note    Patient Details  Name: Kristin VANHOOZER MRN: [id number] Date of Birth: June 23, 1936  Transition of Care [facility]) CM/SW Contact  Loman Risk, RN Phone Number: 12/04/2023, 10:01 AM  Clinical Narrative:     Advanced to soft diet Remains on TPN MD in agreement for [facility] to check in with medical team on Monday to determine approaching medical readiness, in order to resend out bed search       Expected Discharge Plan and Services                                               Social Determinants of Health (SDOH) Interventions SDOH Screenings   Food Insecurity: No Food Insecurity (11/14/2023)  Housing: Low Risk  (11/24/2023)  Transportation Needs: No Transportation Needs (11/14/2023)  Utilities: Not At Risk (11/14/2023)  Alcohol Screen: Low Risk  (09/25/2021)  Depression (PHQ2-9): Low Risk  (08/25/2023)  Financial Resource Strain: Low Risk  (01/09/2023)   [facility] from [facility], [facility]  Physical Activity: Inactive (09/25/2021)  Social Connections: Moderately Integrated (11/14/2023)  Stress: No Stress Concern Present (09/25/2021)  Tobacco Use: Low Risk  (11/13/2023)    Readmission Risk Interventions    11/20/2023    8:57 AM  Readmission Risk Prevention Plan  Transportation Screening Complete  PCP or Specialist Appt within 5-7 Days Complete  Medication Review (RN CM) Complete

## 2023-12-04 NOTE — Consult Note (Signed)
 PHARMACY - TOTAL PARENTERAL NUTRITION CONSULT NOTE   Indication: Prolonged ileus  Patient Measurements: Height: 5' (152.4 cm) Weight: 57.4 kg (126 lb 8.7 oz) IBW/kg (Calculated) : 45.5 TPN AdjBW (KG): 51.8 Body mass index is 24.71 kg/m. Usual Weight: 64.9 kg  Assessment:  NE is a 88 yo female who presented with colostomy issues w separation of mucocutaneous junction and distal necrosis post parietal colectomy due to adenocarcinoma of colon. Previously colectomy was 4/8. They underwent revision of colostomy on 4/25. Pharmacy has been consulted to manage this patient's TPN.   Glucose / Insulin : BG 111-146 0 units of novolog  in the last 24 hours, sSSI q6h.  Electrolytes: WNL Renal: Scr < 1 Hepatic: WNL Intake / Output; net (-) 8289 mL MIVF: No MIVF at this time   Intake/Output Summary (Last 24 hours) at 12/04/2023 0936 Last data filed at 12/04/2023 0358 Gross per 24 hour  Intake --  Output 1350 ml  Net -1350 ml    GI Imaging: 4/23: CT Abdomen Pelvis: Surgical changes from a left colectomy and left lower colostomy. The distal colostomy is abnormal. Beginning approximately 10 cm from the ostomy site the colon is very amorphous and is filled with fluid. There are abnormal gas collections around the bowel and there is a large collection of gas in the subcutaneous tissues. Findings very worrisome for necrosis of the distal 10 cm of colon. 4/30 CT abd/pel: Multiple fluid collections are noted throughout the peritoneal space, concerning for possible abscesses. 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, complicated by pertinent comorbid  conditions   --2 new drains placed yesterday 5/2 by interventional radiology in 2 new right-sided fluid collections   Central access: 11/17/23 TPN start date: 11/17/23  Nutritional Goals: Goal TPN rate is 65 mL/hr (provides 87 g of protein and 1666 kcals per day)  RD Assessment: Estimated Needs Total Energy Estimated Needs: 1600-1800kcal/day Total Protein Estimated Needs: 80-90g/day Total Fluid Estimated Needs: 1.4-1.6L/day  Current Nutrition:  NPO  Plan:  ---Continue custom TPN at goal of 65 mL/hr  ---Surgery to advance to soft diet today --- Electrolytes in TPN: Na 3mEq/L, K 24mEq/L, Ca 63mEq/L, Mg 5 mEq/L, and Phos 15. Cl:Ac 1:1 ---Add standard MVI and trace elements to TPN ---Continue Sensitive q6h SSI and adjust as needed  ---Chromium 10 mcg/day per dietary recommendations:  unable to provide at this time due to national backorder status, removed from TPN 11/24/23 ---Monitor TPN labs on Mon/Thurs  Thank you for involving pharmacy in this patient's care.   Ananias Balls, PharmD Clinical Pharmacist 12/04/2023 9:36 AM

## 2023-12-04 NOTE — Consult Note (Addendum)
 WOC Nurse ostomy consult note Stoma type/location: End colostomy RLQ Stomal assessment/size: 38 mm round, red and viable, slight edema. Peristomal assessment: Intact Treatment options for stomal/peristomal skin: Rind 2" Output 100 ml at 0800. Brown liquid stools Ostomy pouching: 1pc. #[id number] and ring [phone] Education provided: Educational instructions and ostomy pouch change. The pt has an ostomy since 2024. Patient had ostomy revision 11/13/2023 by Dr. Dana Duncan.   Teaching section with the patient and her husband:  - Cleanse the skin with saline, at home the pt can use water  and soup as usual; - Cut the barrier as the size and shape as the ostomy - Applied ring on the barrier, surrounding the cut. - Applied to the skin, matching with the ostomy place. - Closed the lock in roll system. - Empty the pouch when is 1/3 full.   The pt knew the materials, including the ring and 2 system pouch. The pouch change was with patient hand on. Ordered supplies.   Enrolled patient in [place] Secure Start DC program: Yes (05/09)   WOC team will follow weekly.   Please reconsult if further assistance is needed. Thank-you,  Rachel Budds BSN, RN, [employer], WOC  (Pager: [phone])

## 2023-12-04 NOTE — Progress Notes (Signed)
 Physical Therapy Treatment Patient Details Name: Kristin Miller MRN: [id number] DOB: 1935-08-19 Today's Date: 12/04/2023   History of Present Illness Kristin Miller presented to [facility] on 4/25 for planned surgery, with the diagnosis of colostomy complication; now s/p revision. Other dx include back pain, glaucoma, HTN, COPD.    PT Comments  In chair, ready for session.  Stands with cues for hand placements.  She is able to walk to bathroom to void, then continue 100' in hallway with self selected gait distance.  She does have increased fatigue upon return to room with one small LOB requiring assist to prevent fall.  Remains in chair with needs met and visitor in room.  Overall progressing well with mobility skills.   If plan is discharge home, recommend the following: Help with stairs or ramp for entrance;Assist for transportation;Assistance with cooking/housework;A little help with walking and/or transfers;A little help with bathing/dressing/bathroom   Can travel by private vehicle        Equipment Recommendations  Rolling walker (2 wheels)    Recommendations for Other Services       Precautions / Restrictions Precautions Precautions: Fall Recall of Precautions/Restrictions: Intact Restrictions Weight Bearing Restrictions Per Provider Order: No Other Position/Activity Restrictions: Multiple drains     Mobility  Bed Mobility               General bed mobility comments: in chair before and after Patient Response: Cooperative  Transfers Overall transfer level: Needs assistance Equipment used: Rolling walker (2 wheels) Transfers: Sit to/from Stand Sit to Stand: Contact guard assist           General transfer comment: vc's for hand placements    Ambulation/Gait Ambulation/Gait assistance: Contact guard assist, Min assist Gait Distance (Feet): 100 Feet Assistive device: Rolling walker (2 wheels) Gait Pattern/deviations: Step-through pattern, Decreased step  length - right, Decreased step length - left Gait velocity: decreased     General Gait Details: balance deficits increase with fatigue upon return to room with min a x 1 to prevent fall but overall does well   Stairs             Wheelchair Mobility     Tilt Bed Tilt Bed Patient Response: Cooperative  Modified Rankin (Stroke Patients Only)       Balance Overall balance assessment: Needs assistance Sitting-balance support: Feet supported Sitting balance-Leahy Scale: Good     Standing balance support: Bilateral upper extremity supported, Reliant on assistive device for balance, During functional activity Standing balance-Leahy Scale: Fair Standing balance comment: more impared with fatigue/longer gait distances                            Communication    Cognition Arousal: Alert Behavior During Therapy: WFL for tasks assessed/performed   PT - Cognitive impairments: No apparent impairments                                Cueing    Exercises Other Exercises Other Exercises: to bathroom to void    General Comments        Pertinent Vitals/Pain Pain Assessment Pain Assessment: Faces Faces Pain Scale: Hurts a little bit Pain Location: abdomen Pain Descriptors / Indicators: Sore Pain Intervention(s): Repositioned, Monitored during session    Home Living  Prior Function            PT Goals (current goals can now be found in the care plan section) Progress towards PT goals: Progressing toward goals    Frequency    Min 2X/week      PT Plan      Co-evaluation              AM-PAC PT "6 Clicks" Mobility   Outcome Measure  Help needed turning from your back to your side while in a flat bed without using bedrails?: A Little Help needed moving from lying on your back to sitting on the side of a flat bed without using bedrails?: A Little Help needed moving to and from a bed to a chair  (including a wheelchair)?: A Little Help needed standing up from a chair using your arms (e.g., wheelchair or bedside chair)?: A Little Help needed to walk in hospital room?: A Little Help needed climbing 3-5 steps with a railing? : A Little 6 Click Score: 18    End of Session   Activity Tolerance: Patient tolerated treatment well Patient left: in chair;with call bell/phone within reach;with chair alarm set;with family/visitor present Nurse Communication: Mobility status PT Visit Diagnosis: Muscle weakness (generalized) (M62.81);Difficulty in walking, not elsewhere classified (R26.2);Unsteadiness on feet (R26.81)     Time: 1344-1400 PT Time Calculation (min) (ACUTE ONLY): 16 min  Charges:    $Gait Training: 8-22 mins PT General Charges $$ ACUTE PT VISIT: 1 Visit                   Charlanne Cong, PTA 12/04/23, 2:13 PM

## 2023-12-04 NOTE — Progress Notes (Signed)
 Referring Provider(s): Dr. Evelia Hipp  Supervising Physician: Myrlene Asper  Patient Status:  Kristin Miller - In-pt  Chief Complaint: Abdominal abscesses s/p drain placement seen for follow up  Brief History:  88 year old female with a history of colon cancer who underwent a colon resection with ostomy formation secondary to colon cancer on 4/8 with Dr. Dana Duncan. Unfortunately, patient developed necrosis of the ostomy site and subsequently underwent colostomy revision with placement of 2 drains on 4/25. She continued to have some abdominal discomfort and weakness. CT A/P on 4/30 concerning for multiple intra-abdominal abscesses. IR consulted for placement of percutaneous abscess drains in lieu of a third surgical operation. On 5/2 patient underwent placement of 2 LUQ drains in IR with Dr. Wilnette Haste.   Subjective:  Patient resting in bed. Currently without complaints and feels that she is doing better overall.   Allergies: Combigan  [brimonidine  tartrate-timolol ], Lisinopril , and Other  Medications: Prior to Admission medications   Medication Sig Start Date End Date Taking? Authorizing Provider  acetaminophen  (TYLENOL ) 500 MG tablet Take 500 mg by mouth every 6 (six) hours as needed for pain.   Yes [provider]  alendronate  (FOSAMAX ) 70 MG tablet Take 1 tablet (70 mg total) by mouth every 7 (seven) days for 48 doses. Take with a full glass of water  on an empty stomach. 07/21/23 06/15/24 Yes Ziglar, Susan K, MD  amLODipine  (NORVASC ) 5 MG tablet Take 1 tablet (5 mg total) by mouth daily. 07/28/23  Yes Ziglar, Susan K, MD  docusate sodium (COLACE) 100 MG capsule Take 100 mg by mouth daily as needed for mild constipation.   Yes [provider]  latanoprost  (XALATAN ) 0.005 % ophthalmic solution 1 drop at bedtime. Eye doctor both eyes 12/18/20  Yes [provider]  loperamide  (IMODIUM ) 2 MG capsule Take 1 capsule (2 mg total) by mouth as needed for diarrhea or loose stools. May  take 1-2 capsules every 8 hours 11/09/23  Yes Pabon, Diego F, MD  losartan -hydrochlorothiazide  (HYZAAR) 100-12.5 MG tablet Take 1 tablet by mouth daily. 07/21/23  Yes Ziglar, Susan K, MD  metroNIDAZOLE  (FLAGYL ) 500 MG tablet Take 2 tabs (1000 mg total) at 8 am, again at 2 pm, and again at 8 pm the day prior to surgery. 11/11/23  Yes Pabon, Diego F, MD  Multiple Minerals-Vitamins (CAL MAG ZINC +D3 PO) Take 1 tablet by mouth daily.   Yes [provider]  neomycin  (MYCIFRADIN ) 500 MG tablet Take 2 tabs (1000 mg total) at 8 am, again at 2 pm, and again at 8 pm the day prior to surgery. 11/11/23  Yes Pabon, Diego F, MD  oxyCODONE  (OXY IR/ROXICODONE ) 5 MG immediate release tablet Take 1 tablet (5 mg total) by mouth every 4 (four) hours as needed for severe pain (pain score 7-10). 11/09/23  Yes Pabon, Diego F, MD  potassium chloride  SA (KLOR-CON  M) 20 MEQ tablet Take 2 tablets (40 mEq total) by mouth daily. 11/09/23 12/09/23 Yes Pabon, Diego F, MD    Vital Signs: BP (!) 148/87 (BP Location: Right Leg)   Pulse 93   Temp 97.8 F (36.6 C) (Oral)   Resp 16   Ht 5' (1.524 m)   Wt 126 lb 8.7 oz (57.4 kg)   SpO2 97%   BMI 24.71 kg/m   Physical Exam Vitals reviewed.  Constitutional:      Appearance: Normal appearance.  HENT:     Head: Normocephalic and atraumatic.     Mouth/Throat:     Mouth:  Mucous membranes are moist.     Pharynx: Oropharynx is clear.  Pulmonary:     Effort: Pulmonary effort is normal.  Abdominal:     General: Abdomen is flat.     Palpations: Abdomen is soft.     Tenderness: There is no abdominal tenderness.     Comments: RUQ drain/Drain 1: ~20cc serous output LUQ drain/Drain 2: No output in bulb Overlying bandages in place Colostomy with moderate output in bag  Musculoskeletal:        General: Normal range of motion.     Cervical back: Normal range of motion.  Skin:    General: Skin is warm.  Neurological:     General: No focal deficit present.     Mental  Status: She is alert and oriented to person, place, and time. Mental status is at baseline.  Psychiatric:        Mood and Affect: Mood normal.        Behavior: Behavior normal.        Judgment: Judgment normal.     Labs:  CBC: Recent Labs    11/25/23 0607 11/28/23 0549 11/30/23 0435 12/03/23 0455  WBC 22.9* 19.2* 16.4* 11.9*  HGB 7.8* 7.0* 7.5* 8.1*  HCT 23.1* 21.8* 23.9* 26.0*  PLT 653* 865* 945* 815*    COAGS: Recent Labs    11/30/23 0435  INR 1.3*    BMP: Recent Labs    11/28/23 0549 11/29/23 0630 11/30/23 0435 12/03/23 0455  NA 137 135 136 136  K 4.5 4.6 4.5 4.5  CL 101 101 103 101  CO2 25 27 24 26   GLUCOSE 149* 151* 122* 148*  BUN 20 20 22 20   CALCIUM 8.3* 8.4* 8.5* 8.5*  CREATININE 0.50 0.54 0.50 0.51  GFRNONAA >60 >60 >60 >60    LIVER FUNCTION TESTS: Recent Labs    11/23/23 0527 11/26/23 0453 11/30/23 0435 12/03/23 0455  BILITOT 0.4 0.5 0.5 0.4  AST 15 15 14* 13*  ALT 8 9 9 11   ALKPHOS 125 159* 197* 189*  PROT 6.1* 6.2* 7.0 7.1  ALBUMIN  2.0* 1.8* 2.0* 2.0*    Assessment and Plan:  Intra-abdominal abscesses s/p colostomy reversal: Kristin Miller is a 88 y.o. female with a history of colon cancer s/p resection with ostomy formation. She underwent colostomy reversal secondary to ostomy site necrosis and later developed intra-abdominal abscesses for which IR placed 2 percutaneous drains.  -Drains with no output for the past 3 days and minimal output prior to that -Patient doing well overall; Has been tolerating full liquids with plans to advance to soft diet -Has been able to ambulate short distances with assistance -Given no output from either LUQ drains placed in IR (Drain 3 & 4) and continued symptomatic improvement, drains were removed at the bedside. -Patient tolerated well without difficulty  -Surgery informed of drain removal and agreeable to this plan    Thank you for allowing our service to participate in Kristin Miller 's  care. Please feel free to reach out to IR for any additional concerns.   Electronically Signed: Raffaella Edison M Perrie Ragin, PA-C 12/04/2023, 3:04 PM    I spent a total of 15 Minutes at the the patient's bedside AND on the patient's hospital floor or unit, greater than 50% of which was counseling/coordinating care for drain follow up.

## 2023-12-04 NOTE — Progress Notes (Signed)
 OT Cancellation Note  Patient Details Name: Kristin Miller MRN: [id number] DOB: Sep 18, 1935   Cancelled Treatment:    Reason Eval/Treat Not Completed: Other (comment). On first attempt this date, pt had vomited with NT present to assess vitals and asked author to return at later time. Checked on pt additional time, pt had returned to bed and declined session at this time d/t fatigue. Will re-attempt at later date/time.  Langley Ingalls E Torie Towle 12/04/2023, 3:59 PM

## 2023-12-05 LAB — BASIC METABOLIC PANEL WITH GFR
Anion gap: 6 (ref 5–15)
BUN: 25 mg/dL — ABNORMAL HIGH (ref 8–23)
CO2: 24 mmol/L (ref 22–32)
Calcium: 8.3 mg/dL — ABNORMAL LOW (ref 8.9–10.3)
Chloride: 103 mmol/L (ref 98–111)
Creatinine, Ser: 0.59 mg/dL (ref 0.44–1.00)
GFR, Estimated: >60 mL/min (ref >60)
Glucose, Bld: 154 mg/dL — ABNORMAL HIGH (ref 70–99)
Potassium: 4.5 mmol/L (ref 3.5–5.1)
Sodium: 133 mmol/L — ABNORMAL LOW (ref 135–145)

## 2023-12-05 LAB — GLUCOSE, CAPILLARY
Glucose-Capillary: 142 mg/dL — ABNORMAL HIGH (ref 70–99)
Glucose-Capillary: 144 mg/dL — ABNORMAL HIGH (ref 70–99)
Glucose-Capillary: 145 mg/dL — ABNORMAL HIGH (ref 70–99)
Glucose-Capillary: 162 mg/dL — ABNORMAL HIGH (ref 70–99)

## 2023-12-05 LAB — CBC
HCT: 25.5 % — ABNORMAL LOW (ref 36.0–46.0)
Hemoglobin: 7.8 g/dL — ABNORMAL LOW (ref 12.0–15.0)
MCH: 27.1 pg (ref 26.0–34.0)
MCHC: 30.6 g/dL (ref 30.0–36.0)
MCV: 88.5 fL (ref 80.0–100.0)
Platelets: 719 10*3/uL — ABNORMAL HIGH (ref 150–400)
RBC: 2.88 MIL/uL — ABNORMAL LOW (ref 3.87–5.11)
RDW: 17 % — ABNORMAL HIGH (ref 11.5–15.5)
WBC: 14.1 10*3/uL — ABNORMAL HIGH (ref 4.0–10.5)
nRBC: 0 % (ref 0.0–0.2)

## 2023-12-05 MED ORDER — TRACE MINERALS CU-MN-SE-ZN 300-55-60-3000 MCG/ML IV SOLN
INTRAVENOUS | Status: AC
Start: 2023-12-05 — End: 2023-12-06
  Filled 2023-12-05: qty 582.4

## 2023-12-05 NOTE — Progress Notes (Signed)
 [facility] SURGICAL PROGRESS NOTE  Hospital Day(s): 22.   Post op day(s): 22 Days Post-Op.   Interval History:  Patient seen and examined No acute events or new complaints overnight. Started on soft diet and IR drains removed yesterday  Patient reports she is doing well; had a good morning No emesis, fever, chills Leukocytosis, not sure the driver of this but afebrile, abdominal exam benign and feeling well clinically  Drains as follows...             - LUQ: 0 ccs              - RUQ: 45 ccs; bilious  - IR Drains; 0 ccs  She is on soft diet Colostomy; with stool and gas She is ambulating/working with therapies   Vital signs in last 24 hours: [min-max] current  Temp:  [97.8 F (36.6 C)-98.7 F (37.1 C)] 98.6 F (37 C) (05/17 0723) Pulse Rate:  [72-93] 72 (05/17 0723) Resp:  [15-17] 15 (05/17 0723) BP: (133-150)/(56-93) 138/57 (05/17 0723) SpO2:  [93 %-97 %] 94 % (05/17 0723) Weight:  [58.4 kg] 58.4 kg (05/17 0459)     Height: 5' (152.4 cm) Weight: 58.4 kg BMI (Calculated): 25.14   Intake/Output last 2 shifts:  05/16 0701 - 05/17 0700 In: 2339.9 [I.V.:2339.9] Out: 45 [Drains:45]   Physical Exam:  Constitutional: alert, cooperative and no distress  Respiratory: breathing non-labored at rest  Cardiovascular: regular rate and sinus rhythm  Gastrointestinal: soft, non-tender, and non-distended. Colostomy in right abdomen; gas and dark liquid in bag, drains x2 present; output primarily from surgical drain in right abdomen; bilious  Labs:     Latest Ref Rng & Units 12/05/2023    8:00 AM 12/03/2023    4:55 AM 11/30/2023    4:35 AM  CBC  WBC 4.0 - 10.5 K/uL 14.1  11.9  16.4   Hemoglobin 12.0 - 15.0 g/dL 7.8  8.1  7.5   Hematocrit 36.0 - 46.0 % 25.5  26.0  23.9   Platelets 150 - 400 K/uL 719  815  945       Latest Ref Rng & Units 12/05/2023    8:00 AM 12/03/2023    4:55 AM 11/30/2023    4:35 AM  CMP  Glucose 70 - 99 mg/dL 960  454  098   BUN 8 - 23 mg/dL  25  20  22    Creatinine 0.44 - 1.00 mg/dL 1.19  1.47  8.29   Sodium 135 - 145 mmol/L 133  136  136   Potassium 3.5 - 5.1 mmol/L 4.5  4.5  4.5   Chloride 98 - 111 mmol/L 103  101  103   CO2 22 - 32 mmol/L 24  26  24    Calcium 8.9 - 10.3 mg/dL 8.3  8.5  8.5   Total Protein 6.5 - 8.1 g/dL  7.1  7.0   Total Bilirubin 0.0 - 1.2 mg/dL  0.4  0.5   Alkaline Phos 38 - 126 U/L  189  197   AST 15 - 41 U/L  13  14   ALT 0 - 44 U/L  11  9      Imaging studies: No new pertinent imaging studies   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  - Continue drains; monitor and record output strictly - may be able to start removal of left sided drains soon  - Continue octreotide , loperamide  to decrease bowel secretions    - Monitor colostomy output; WOC on board - appreciate help   - Pain control prn; antiemetics prn   - Mobilize as tolerated; PT/OT on board    - Appreciate medicine assistance as well   -- Severa Daniels, M.D. [facility]

## 2023-12-05 NOTE — Consult Note (Signed)
 PHARMACY - TOTAL PARENTERAL NUTRITION CONSULT NOTE   Indication: Prolonged ileus  Patient Measurements: Height: 5' (152.4 cm) Weight: 58.4 kg (128 lb 12 oz) IBW/kg (Calculated) : 45.5 TPN AdjBW (KG): 51.8 Body mass index is 25.14 kg/m. Usual Weight: 64.9 kg  Assessment:  Kristin Miller is a 88 yo female who presented with colostomy issues w separation of mucocutaneous junction and distal necrosis post parietal colectomy due to adenocarcinoma of colon. Previously colectomy was 4/8. They underwent revision of colostomy on 4/25. Pharmacy has been consulted to manage this patient's TPN.   Glucose / Insulin : BG 154 3 units of novolog  in the last 24 hours, sSSI q6h.  Electrolytes: WNL Renal: Scr < 1 Hepatic: WNL Intake / Output; net (-) 5715 mL MIVF: No MIVF at this time   Intake/Output Summary (Last 24 hours) at 12/05/2023 1006 Last data filed at 12/05/2023 1610 Gross per 24 hour  Intake 2339.85 ml  Output 1245 ml  Net 1094.85 ml    GI Imaging: 4/23: CT Abdomen Pelvis: Surgical changes from a left colectomy and left lower colostomy. The distal colostomy is abnormal. Beginning approximately 10 cm from the ostomy site the colon is very amorphous and is filled with fluid. There are abnormal gas collections around the bowel and there is a large collection of gas in the subcutaneous tissues. Findings very worrisome for necrosis of the distal 10 cm of colon. 4/30 CT abd/pel: Multiple fluid collections are noted throughout the peritoneal space, concerning for possible abscesses. 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, complicated by pertinent comorbid  conditions   --2 new drains placed yesterday 5/2 by interventional radiology in 2 new right-sided fluid collections   Central access: 11/17/23 TPN start date: 11/17/23  Nutritional Goals: Goal TPN rate is 65 mL/hr (provides 87 g of protein and 1666 kcals per day)  RD Assessment: Estimated Needs Total Energy Estimated Needs: 1600-1800kcal/day Total Protein Estimated Needs: 80-90g/day Total Fluid Estimated Needs: 1.4-1.6L/day  Current Nutrition:  NPO  Plan:  ---Continue custom TPN at goal of 65 mL/hr  ---Surgery to advance to soft diet today --- Electrolytes in TPN: Na 44mEq/L, K 92mEq/L, Ca 37mEq/L, Mg 5 mEq/L, and Phos 15. Cl:Ac 1:1 ---Add standard MVI and trace elements to TPN ---Continue Sensitive q6h SSI and adjust as needed  ---Chromium 10 mcg/day per dietary recommendations:  unable to provide at this time due to national backorder status, removed from TPN 11/24/23 ---Monitor TPN labs on Mon/Thurs  Thank you for involving pharmacy in this patient's care.   Harlie Lieu, PharmD Clinical Pharmacist 12/05/2023 10:06 AM

## 2023-12-05 NOTE — Progress Notes (Signed)
 Mobility Specialist - Progress Note   12/05/23 1350  Mobility  Activity Ambulated with assistance in hallway  Level of Assistance Standby assist, set-up cues, supervision of patient - no hands on  Assistive Device Front wheel walker  Distance Ambulated (ft) 100 ft  Activity Response Tolerated well  Mobility Referral Yes  Mobility visit 1 Mobility  Mobility Specialist Start Time (ACUTE ONLY) 1311  Mobility Specialist Stop Time (ACUTE ONLY) 1328  Mobility Specialist Time Calculation (min) (ACUTE ONLY) 17 min   Pt in bathroom upon arrival. Pt STS and able to ambulate to sink and in hallway SBA with no LOB noted. Pt returns to recliner with needs in reach and chair alarm activated.  Wash Hack  Mobility Specialist  12/05/23 1:52 PM

## 2023-12-05 NOTE — Progress Notes (Signed)
 Mobility Specialist - Progress Note   12/05/23 1348  Mobility  Activity Ambulated with assistance in room;Ambulated with assistance to bathroom;Stood at bedside;Dangled on edge of bed  Level of Assistance Standby assist, set-up cues, supervision of patient - no hands on  Assistive Device Front wheel walker  Distance Ambulated (ft) 10 ft  Activity Response Tolerated well  Mobility Referral Yes  Mobility visit 1 Mobility  Mobility Specialist Start Time (ACUTE ONLY) 1244  Mobility Specialist Stop Time (ACUTE ONLY) 1305  Mobility Specialist Time Calculation (min) (ACUTE ONLY) 21 min   Pt semi-supine in bed on RA upon arrival. Pt completes bed mobility indep with extra time and heavy use of bed rails. Pt STS x2 and ambulates to bathroom SBA. Pt left in bathroom with education on how to call for assistance.   Wash Hack  Mobility Specialist  12/05/23 1:50 PM

## 2023-12-06 LAB — GLUCOSE, CAPILLARY
Glucose-Capillary: 155 mg/dL — ABNORMAL HIGH (ref 70–99)
Glucose-Capillary: 147 mg/dL — ABNORMAL HIGH (ref 70–99)
Glucose-Capillary: 134 mg/dL — ABNORMAL HIGH (ref 70–99)

## 2023-12-06 MED ORDER — TRACE MINERALS CU-MN-SE-ZN 300-55-60-3000 MCG/ML IV SOLN
INTRAVENOUS | Status: AC
  Filled 2023-12-06: qty 582.4

## 2023-12-06 NOTE — Progress Notes (Signed)
 [facility] SURGICAL PROGRESS NOTE  Hospital Day(s): 23.   Post op day(s): 23 Days Post-Op.   Interval History:  Patient seen and examined No acute events or new complaints overnight. Tolerated soft diet without increase in drain output.  Patient reports she is doing well; had a good morning No emesis, fever, chills Drains as follows...             -nothing recorded, left drain with bilious output, scant amount in bulb and right drain with biliopurulent output, scant She is on soft diet Colostomy; with stool and gas She is ambulating/working with therapies   Vital signs in last 24 hours: [min-max] current  Temp:  [98.1 F (36.7 C)-100.5 F (38.1 C)] 98.4 F (36.9 C) (05/18 0358) Pulse Rate:  [67-87] 67 (05/18 0358) Resp:  [14-18] 16 (05/18 0358) BP: (134-161)/(53-81) 134/53 (05/18 0358) SpO2:  [95 %-98 %] 95 % (05/18 0358)     Height: 5' (152.4 cm) Weight: 58.4 kg BMI (Calculated): 25.14   Intake/Output last 2 shifts:  05/17 0701 - 05/18 0700 In: 1587.2 [P.O.:360; I.V.:1227.2] Out: 1900 [Urine:1300; Stool:600]   Physical Exam:  Constitutional: alert, cooperative and no distress  Respiratory: breathing non-labored at rest  Cardiovascular: regular rate and sinus rhythm  Gastrointestinal: soft, non-tender, and non-distended. Colostomy in right abdomen; gas and dark liquid in bag, drains x2 present; output primarily from surgical drain in right abdomen; bilious  Labs:     Latest Ref Rng & Units 12/05/2023    8:00 AM 12/03/2023    4:55 AM 11/30/2023    4:35 AM  CBC  WBC 4.0 - 10.5 K/uL 14.1  11.9  16.4   Hemoglobin 12.0 - 15.0 g/dL 7.8  8.1  7.5   Hematocrit 36.0 - 46.0 % 25.5  26.0  23.9   Platelets 150 - 400 K/uL 719  815  945       Latest Ref Rng & Units 12/05/2023    8:00 AM 12/03/2023    4:55 AM 11/30/2023    4:35 AM  CMP  Glucose 70 - 99 mg/dL 161  096  045   BUN 8 - 23 mg/dL 25  20  22    Creatinine 0.44 - 1.00 mg/dL 4.09  8.11  9.14   Sodium  135 - 145 mmol/L 133  136  136   Potassium 3.5 - 5.1 mmol/L 4.5  4.5  4.5   Chloride 98 - 111 mmol/L 103  101  103   CO2 22 - 32 mmol/L 24  26  24    Calcium 8.9 - 10.3 mg/dL 8.3  8.5  8.5   Total Protein 6.5 - 8.1 g/dL  7.1  7.0   Total Bilirubin 0.0 - 1.2 mg/dL  0.4  0.5   Alkaline Phos 38 - 126 U/L  189  197   AST 15 - 41 U/L  13  14   ALT 0 - 44 U/L  11  9      Imaging studies: No new pertinent imaging studies   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  - Continue drains; monitor and record output strictly  -  Continue octreotide , loperamide  to decrease bowel secretions    - Monitor colostomy output; WOC on board - appreciate help   - Pain control prn; antiemetics prn   - Mobilize as tolerated; PT/OT on board    - Appreciate medicine assistance as well   -- Severa Daniels, M.D. [facility]

## 2023-12-06 NOTE — Consult Note (Signed)
 PHARMACY - TOTAL PARENTERAL NUTRITION CONSULT NOTE   Indication: Prolonged ileus  Patient Measurements: Height: 5' (152.4 cm) Weight: 58.4 kg (128 lb 12 oz) IBW/kg (Calculated) : 45.5 TPN AdjBW (KG): 51.8 Body mass index is 25.14 kg/m. Usual Weight: 64.9 kg  Assessment:  NE is a 88 yo female who presented with colostomy issues w separation of mucocutaneous junction and distal necrosis post parietal colectomy due to adenocarcinoma of colon. Previously colectomy was 4/8. They underwent revision of colostomy on 4/25. Pharmacy has been consulted to manage this patient's TPN.   Glucose / Insulin : BG 155 4 units of novolog  in the last 24 hours, sSSI q6h.  Electrolytes: WNL Renal: Scr < 1 Hepatic: WNL Intake / Output; net (-) 3.9 L MIVF: No MIVF at this time   Intake/Output Summary (Last 24 hours) at 12/06/2023 1610 Last data filed at 12/06/2023 9604 Gross per 24 hour  Intake 1587.2 ml  Output 1300 ml  Net 287.2 ml    GI Imaging: 4/23: CT Abdomen Pelvis: Surgical changes from a left colectomy and left lower colostomy. The distal colostomy is abnormal. Beginning approximately 10 cm from the ostomy site the colon is very amorphous and is filled with fluid. There are abnormal gas collections around the bowel and there is a large collection of gas in the subcutaneous tissues. Findings very worrisome for necrosis of the distal 10 cm of colon. 4/30 CT abd/pel: Multiple fluid collections are noted throughout the peritoneal space, concerning for possible abscesses. 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, complicated by pertinent comorbid  conditions   --2 new drains placed yesterday 5/2 by interventional radiology in 2 new right-sided fluid collections   Central access: 11/17/23 TPN start date: 11/17/23  Nutritional Goals: Goal TPN rate is 65 mL/hr (provides 87 g of protein and 1666 kcals per day)  RD Assessment: Estimated Needs Total Energy Estimated Needs: 1600-1800kcal/day Total Protein Estimated Needs: 80-90g/day Total Fluid Estimated Needs: 1.4-1.6L/day  Current Nutrition:  NPO  Plan:  ---Continue custom TPN at goal of 65 mL/hr  --- Electrolytes in TPN: Na 61mEq/L, K 69mEq/L, Ca 1mEq/L, Mg 5 mEq/L, and Phos 15. Cl:Ac 1:1 ---Add standard MVI and trace elements to TPN ---Continue Sensitive q6h SSI and adjust as needed  ---Chromium 10 mcg/day per dietary recommendations:  unable to provide at this time due to national backorder status, removed from TPN 11/24/23 ---Monitor TPN labs on Mon/Thurs  Thank you for involving pharmacy in this patient's care.   Harlie Lieu, PharmD Clinical Pharmacist 12/06/2023 8:22 AM

## 2023-12-06 NOTE — Plan of Care (Signed)

## 2023-12-07 LAB — COMPREHENSIVE METABOLIC PANEL WITH GFR
ALT: 11 U/L (ref 0–44)
AST: 15 U/L (ref 15–41)
Albumin: 1.8 g/dL — ABNORMAL LOW (ref 3.5–5.0)
Alkaline Phosphatase: 166 U/L — ABNORMAL HIGH (ref 38–126)
Anion gap: 8 (ref 5–15)
BUN: 23 mg/dL (ref 8–23)
CO2: 25 mmol/L (ref 22–32)
Calcium: 8.3 mg/dL — ABNORMAL LOW (ref 8.9–10.3)
Chloride: 100 mmol/L (ref 98–111)
Creatinine, Ser: 0.43 mg/dL — ABNORMAL LOW (ref 0.44–1.00)
GFR, Estimated: >60 mL/min (ref >60)
Glucose, Bld: 143 mg/dL — ABNORMAL HIGH (ref 70–99)
Potassium: 4.5 mmol/L (ref 3.5–5.1)
Sodium: 133 mmol/L — ABNORMAL LOW (ref 135–145)
Total Bilirubin: 0.5 mg/dL (ref 0.0–1.2)
Total Protein: 6.9 g/dL (ref 6.5–8.1)

## 2023-12-07 LAB — GLUCOSE, CAPILLARY
Glucose-Capillary: 139 mg/dL — ABNORMAL HIGH (ref 70–99)
Glucose-Capillary: 141 mg/dL — ABNORMAL HIGH (ref 70–99)
Glucose-Capillary: 142 mg/dL — ABNORMAL HIGH (ref 70–99)
Glucose-Capillary: 145 mg/dL — ABNORMAL HIGH (ref 70–99)
Glucose-Capillary: 151 mg/dL — ABNORMAL HIGH (ref 70–99)

## 2023-12-07 LAB — MAGNESIUM: Magnesium: 1.9 mg/dL (ref 1.7–2.4)

## 2023-12-07 LAB — PHOSPHORUS: Phosphorus: 3.3 mg/dL (ref 2.5–4.6)

## 2023-12-07 LAB — TRIGLYCERIDES: Triglycerides: 82 mg/dL (ref <150)

## 2023-12-07 MED ORDER — MAGNESIUM SULFATE IN D5W 1-5 GM/100ML-% IV SOLN
1.0000 g | Freq: Once | INTRAVENOUS | Status: AC
  Administered 2023-12-07: 1 g via INTRAVENOUS
  Filled 2023-12-07: qty 100

## 2023-12-07 MED ORDER — TRACE MINERALS CU-MN-SE-ZN 300-55-60-3000 MCG/ML IV SOLN
INTRAVENOUS | Status: AC
  Filled 2023-12-07: qty 313.6

## 2023-12-07 NOTE — Consult Note (Addendum)
 WOC Nurse ostomy follow up Pt states she is independent with ostomy pouch application and emptying prior to admission.  She uses a flat 2 piece pouching system and barrier ring.   Current pouch has leaked behind the barrier and into the staples which are located at 3:00 o'clock.  Cleansed and applied one piece convex pouch and barrier ring which was in the room but ordered other supplies for next change: Ostomy supplies: barrier ring Timm Foot # [id number], wafer Timm Foot # 2, pouch Lawson # [id number]. Stoma is red and viable, 1 1/2 inches, above skin level.  Pt will be able to use same supplies she had at home prior to the revision surgery. She is already set up for supply delivery prior to this admission.  Emptied 70cc brown liquid stool.   WOC will assess patient for needs again next week, if she is still in the hospital at that time.  Thank-you,  Wiliam Harder MSN, RN, CWOCN, [place], CNS [phone]

## 2023-12-07 NOTE — Progress Notes (Signed)
 Physical Therapy Treatment Patient Details Name: Kristin Miller MRN: [id number] DOB: February 02, 1936 Today's Date: 12/07/2023   History of Present Illness Kristin Miller presented to [facility] on 4/25 for planned surgery, with the diagnosis of colostomy complication; now s/p revision. Other dx include back pain, glaucoma, HTN, COPD.    PT Comments  Pt was pleasant and motivated to participate during the session and put forth good effort throughout. Pt required cuing for sequencing with transfers from various height surfaces and during gait training but was generally steady with no overt LOB this session.  Pt was able to perform two bouts of ambulation with the longest distance being her self-selected max of around 80 feet. Pt reported mod SOB after that distance with SpO2 and HR WNL on room air and with SOB resolving quickly upon returning to sitting.  Pt will benefit from continued PT services upon discharge to safely address deficits listed in patient problem list for decreased caregiver assistance and eventual return to PLOF.       If plan is discharge home, recommend the following: Help with stairs or ramp for entrance;Assist for transportation;Assistance with cooking/housework;A little help with walking and/or transfers;A little help with bathing/dressing/bathroom   Can travel by private vehicle     Yes  Equipment Recommendations  Rolling walker (2 wheels)    Recommendations for Other Services       Precautions / Restrictions Precautions Precautions: Fall Recall of Precautions/Restrictions: Intact Restrictions Weight Bearing Restrictions Per Provider Order: No Other Position/Activity Restrictions: One drain/bulb on both left and right sides     Mobility  Bed Mobility               General bed mobility comments: NT, pt in recliner pre-post session    Transfers Overall transfer level: Needs assistance Equipment used: Rolling walker (2 wheels) Transfers: Sit to/from  Stand Sit to Stand: Contact guard assist           General transfer comment: verb and tactile cues for hand placement    Ambulation/Gait Ambulation/Gait assistance: Contact guard assist Gait Distance (Feet): 80 Feet x 1, 15 Feet x 1 Assistive device: Rolling walker (2 wheels) Gait Pattern/deviations: Step-through pattern, Decreased step length - right, Decreased step length - left Gait velocity: decreased     General Gait Details: Slow cadence with short B step length but steady with no overt LOB   Stairs             Wheelchair Mobility     Tilt Bed    Modified Rankin (Stroke Patients Only)       Balance Overall balance assessment: Needs assistance Sitting-balance support: Feet supported Sitting balance-Leahy Scale: Good     Standing balance support: Bilateral upper extremity supported, Reliant on assistive device for balance, During functional activity Standing balance-Leahy Scale: Fair                              Hotel manager: No apparent difficulties  Cognition Arousal: Alert Behavior During Therapy: WFL for tasks assessed/performed   PT - Cognitive impairments: No apparent impairments                         Following commands: Intact      Cueing Cueing Techniques: Verbal cues, Tactile cues  Exercises Total Joint Exercises Ankle Circles/Pumps: AROM, Strengthening, Both, 10 reps Quad Sets: Strengthening, Both, 10 reps Gluteal Sets: Strengthening, Both,  10 reps Other Exercises Other Exercises: HEP education for BLE APs, QS, and GS x 10 each every 1-2 hours daily    General Comments General comments (skin integrity, edema, etc.): all drains/PICC intact pre/post session      Pertinent Vitals/Pain Pain Assessment Pain Assessment: No/denies pain    Home Living                          Prior Function            PT Goals (current goals can now be found in the care plan  section) Progress towards PT goals: Progressing toward goals    Frequency    Min 2X/week      PT Plan      Co-evaluation              AM-PAC PT "6 Clicks" Mobility   Outcome Measure  Help needed turning from your back to your side while in a flat bed without using bedrails?: A Little Help needed moving from lying on your back to sitting on the side of a flat bed without using bedrails?: A Little Help needed moving to and from a bed to a chair (including a wheelchair)?: A Little Help needed standing up from a chair using your arms (e.g., wheelchair or bedside chair)?: A Little Help needed to walk in hospital room?: A Little Help needed climbing 3-5 steps with a railing? : A Lot 6 Click Score: 17    End of Session Equipment Utilized During Treatment: Gait belt;Other (comment) (Gait belt placed under the arms to avoid drains) Activity Tolerance: Patient tolerated treatment well Patient left: in chair;with call bell/phone within reach;with chair alarm set Nurse Communication: Mobility status PT Visit Diagnosis: Muscle weakness (generalized) (M62.81);Difficulty in walking, not elsewhere classified (R26.2);Unsteadiness on feet (R26.81)     Time: 2956-2130 PT Time Calculation (min) (ACUTE ONLY): 29 min  Charges:    $Gait Training: 8-22 mins $Therapeutic Activity: 8-22 mins PT General Charges $$ ACUTE PT VISIT: 1 Visit                     D. Scott Alyssamarie Mounsey PT, DPT 12/07/23, 4:04 PM

## 2023-12-07 NOTE — Progress Notes (Signed)
 Occupational Therapy Treatment Patient Details Name: Kristin Miller MRN: [id number] DOB: June 22, 1936 Today's Date: 12/07/2023   History of present illness Kristin Miller presented to [facility] on 4/25 for planned surgery, with the diagnosis of colostomy complication; now s/p revision. Other dx include back pain, glaucoma, HTN, COPD.   OT comments  Pt is supine in bed on arrival. Pleasant and agreeable to OT session. She denies pain or nausea throughout session and participates well with frequent rest breaks and good pacing between tasks. Pt performed bed mobility with CGA via log roll technique. Pt required set up assist for UB bathing seated at EOB. Supervision to CGA for standing oral care/sitting on BSC in front of sink for energy conservation. Pt continues to have difficulty reaching her feet even with attempts at figure four position. Mod A overall for LB bathing during session with edu provided on all long handled AE/AD that can be utilized for ADL performance as needed. Pt verbalized understanding. CGA for STS, SPT and in room mobility using RW.  Pt left seated in recliner with all needs in place and will cont to require skilled acute OT services to maximize her safety and IND to return to PLOF.       If plan is discharge home, recommend the following:  Assistance with cooking/housework;Assist for transportation;Help with stairs or ramp for entrance;A little help with walking and/or transfers;A lot of help with bathing/dressing/bathroom   Equipment Recommendations  Other (comment) (defer)    Recommendations for Other Services      Precautions / Restrictions Precautions Precautions: Fall Recall of Precautions/Restrictions: Intact Restrictions Weight Bearing Restrictions Per Provider Order: No Other Position/Activity Restrictions: Multiple drains       Mobility Bed Mobility Overal bed mobility: Needs Assistance Bed Mobility: Rolling, Sidelying to Sit Rolling:  Supervision Sidelying to sit: Contact guard assist, Used rails, HOB elevated       General bed mobility comments: increased time and cueing for log roll technique with HOB slightly elevated    Transfers Overall transfer level: Needs assistance Equipment used: Rolling walker (2 wheels) Transfers: Sit to/from Stand Sit to Stand: Contact guard assist           General transfer comment: verb cues for hand placement     Balance Overall balance assessment: Needs assistance Sitting-balance support: Feet supported Sitting balance-Leahy Scale: Good     Standing balance support: Bilateral upper extremity supported, Reliant on assistive device for balance, During functional activity Standing balance-Leahy Scale: Fair Standing balance comment: CGA for safety/as pt fatigues                           ADL either performed or assessed with clinical judgement   ADL Overall ADL's : Needs assistance/impaired     Grooming: Oral care;Sitting;Standing;Wash/dry face;Supervision/safety;Contact guard assist   Upper Body Bathing: Supervision/ safety;Sitting   Lower Body Bathing: Moderate assistance;Sit to/from stand;Sitting/lateral leans   Upper Body Dressing : Minimal assistance;Sitting   Lower Body Dressing: Maximal assistance;Sitting/lateral leans Lower Body Dressing Details (indicate cue type and reason): unable to reach feet to don socks-attempted via figure four but reports discomfort to abdomen Toilet Transfer: Stand-pivot;BSC/3in1;Contact guard assist Toilet Transfer Details (indicate cue type and reason): d/t urgency used BSC vs walking to toilet Toileting- Clothing Manipulation and Hygiene: Supervision/safety;Sitting/lateral lean       Functional mobility during ADLs: Contact guard assist;Rolling walker (2 wheels)      Extremity/Trunk Assessment  Vision       Perception     Praxis     Communication     Cognition Arousal: Alert Behavior  During Therapy: WFL for tasks assessed/performed                                          Cueing      Exercises      Shoulder Instructions       General Comments all drains/PICC intact pre/post session    Pertinent Vitals/ Pain       Pain Assessment Pain Assessment: No/denies pain Pain Intervention(s): Monitored during session, Repositioned  Home Living                                          Prior Functioning/Environment              Frequency  Min 2X/week        Progress Toward Goals  OT Goals(current goals can now be found in the care plan section)  Progress towards OT goals: Progressing toward goals  Acute Rehab OT Goals Patient Stated Goal: improve strength/endurance OT Goal Formulation: With patient Time For Goal Achievement: 12/21/23 Potential to Achieve Goals: Good ADL Goals Pt Will Perform Lower Body Bathing: sitting/lateral leans;sit to/from stand;with adaptive equipment;with supervision Pt Will Perform Lower Body Dressing: with modified independence;sitting/lateral leans;sit to/from stand;with adaptive equipment  Plan      Co-evaluation                 AM-PAC OT "6 Clicks" Daily Activity     Outcome Measure   Help from another person eating meals?: None Help from another person taking care of personal grooming?: A Little Help from another person toileting, which includes using toliet, bedpan, or urinal?: A Little Help from another person bathing (including washing, rinsing, drying)?: A Lot Help from another person to put on and taking off regular upper body clothing?: A Little Help from another person to put on and taking off regular lower body clothing?: A Lot 6 Click Score: 17    End of Session Equipment Utilized During Treatment: Gait belt;Rolling walker (2 wheels)  OT Visit Diagnosis: Muscle weakness (generalized) (M62.81)   Activity Tolerance Patient tolerated treatment well   Patient  Left with call bell/phone within reach;in chair;with chair alarm set   Nurse Communication Mobility status        Time: 6962-9528 OT Time Calculation (min): 50 min  Charges: OT General Charges $OT Visit: 1 Visit OT Treatments $Self Care/Home Management : 23-37 mins $Therapeutic Activity: 8-22 mins  Asael Pann, OTR/L  12/07/23, 1:10 PM   [name] E Margi Edmundson 12/07/2023, 1:04 PM

## 2023-12-07 NOTE — Consult Note (Signed)
 PHARMACY - TOTAL PARENTERAL NUTRITION CONSULT NOTE   Indication: Prolonged ileus  Patient Measurements: Height: 5' (152.4 cm) Weight: 62.2 kg (137 lb 2 oz) IBW/kg (Calculated) : 45.5 TPN AdjBW (KG): 51.8 Body mass index is 26.78 kg/m. Usual Weight: 64.9 kg  Assessment:  NE is a 88 yo female who presented with colostomy issues w separation of mucocutaneous junction and distal necrosis post parietal colectomy due to adenocarcinoma of colon. Previously colectomy was 4/8. They underwent revision of colostomy on 4/25. Pharmacy has been consulted to manage this patient's TPN.   Glucose / Insulin : BG 134-147 3 units of novolog  in the last 24 hours, sSSI q6h.  Electrolytes: Mg = 1.9, Mag Sulf 1g IV x 1 given outside of bag Renal: Scr < 1 Hepatic: WNL Intake / Output; net (-) 3.9 L MIVF: No MIVF at this time   Intake/Output Summary (Last 24 hours) at 12/07/2023 1022 Last data filed at 12/07/2023 0900 Gross per 24 hour  Intake 812.92 ml  Output 770 ml  Net 42.92 ml    GI Imaging: 4/23: CT Abdomen Pelvis: Surgical changes from a left colectomy and left lower colostomy. The distal colostomy is abnormal. Beginning approximately 10 cm from the ostomy site the colon is very amorphous and is filled with fluid. There are abnormal gas collections around the bowel and there is a large collection of gas in the subcutaneous tissues. Findings very worrisome for necrosis of the distal 10 cm of colon. 4/30 CT abd/pel: Multiple fluid collections are noted throughout the peritoneal space, concerning for possible abscesses. 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, complicated by pertinent comorbid conditions   --2 new drains placed yesterday 5/2 by interventional radiology in 2 new right-sided fluid collections   Central access: 11/17/23 TPN start date: 11/17/23  Nutritional Goals: Goal TPN rate is 65 mL/hr (provides 87 g of protein and 1666 kcals per day)  RD Assessment: Estimated Needs Total Energy Estimated Needs: 1600-1800kcal/day Total Protein Estimated Needs: 80-90g/day Total Fluid Estimated Needs: 1.4-1.6L/day  Current Nutrition:  Soft diet  Plan:  ---Will wean custom TPN to rate of 35 mL/hr due to increase in PO intake --- Electrolytes in TPN: Na 22mEq/L, K 41mEq/L, Ca 52mEq/L, Mg 5 mEq/L, and Phos 15. Cl:Ac 1:1 ---Add standard MVI and trace elements to TPN ---Continue Sensitive q6h SSI and adjust as needed  ---Chromium 10 mcg/day per dietary recommendations:  unable to provide at this time due to national backorder status, removed from TPN 11/24/23 ---Monitor TPN labs on Mon/Thurs  Thank you for involving pharmacy in this patient's care.   Ramonita Burow, PharmD Clinical Pharmacist 12/07/2023 10:22 AM

## 2023-12-07 NOTE — Progress Notes (Signed)
 [facility] SURGICAL PROGRESS NOTE  Hospital Day(s): 24.   Post op day(s): 24 Days Post-Op.   Interval History:  Patient seen and examined No acute events or new complaints overnight.  Patient reports she is feeling well  No significant abdominal pain No emesis, fever, chills Labs are reassuring this AM Drains as follows...             - LUQ: 0 ccs              - RUQ: 0 ccs She is on soft diet Colostomy; with stool and gas She is ambulating/working with therapies   Vital signs in last 24 hours: [min-max] current  Temp:  [98 F (36.7 C)-99.2 F (37.3 C)] 98.7 F (37.1 C) (05/19 0432) Pulse Rate:  [72-81] 75 (05/19 0432) Resp:  [18] 18 (05/19 0432) BP: (129-154)/(60-68) 154/66 (05/19 0432) SpO2:  [95 %-96 %] 96 % (05/19 0432) Weight:  [62.2 kg] 62.2 kg (05/19 0500)     Height: 5' (152.4 cm) Weight: 62.2 kg BMI (Calculated): 26.78   Intake/Output last 2 shifts:  05/18 0701 - 05/19 0700 In: 1292.9 [P.O.:600; I.V.:692.9] Out: 975 [Urine:700; Stool:275]   Physical Exam:  Constitutional: alert, cooperative and no distress  Respiratory: breathing non-labored at rest  Cardiovascular: regular rate and sinus rhythm  Gastrointestinal: soft, non-tender, and non-distended. Colostomy in right abdomen; gas and dark liquid in bag, drains x2 present; output slowed/stopped  Labs:     Latest Ref Rng & Units 12/05/2023    8:00 AM 12/03/2023    4:55 AM 11/30/2023    4:35 AM  CBC  WBC 4.0 - 10.5 K/uL 14.1  11.9  16.4   Hemoglobin 12.0 - 15.0 g/dL 7.8  8.1  7.5   Hematocrit 36.0 - 46.0 % 25.5  26.0  23.9   Platelets 150 - 400 K/uL 719  815  945       Latest Ref Rng & Units 12/05/2023    8:00 AM 12/03/2023    4:55 AM 11/30/2023    4:35 AM  CMP  Glucose 70 - 99 mg/dL 161  096  045   BUN 8 - 23 mg/dL 25  20  22    Creatinine 0.44 - 1.00 mg/dL 4.09  8.11  9.14   Sodium 135 - 145 mmol/L 133  136  136   Potassium 3.5 - 5.1 mmol/L 4.5  4.5  4.5   Chloride 98 - 111 mmol/L  103  101  103   CO2 22 - 32 mmol/L 24  26  24    Calcium 8.9 - 10.3 mg/dL 8.3  8.5  8.5   Total Protein 6.5 - 8.1 g/dL  7.1  7.0   Total Bilirubin 0.0 - 1.2 mg/dL  0.4  0.5   Alkaline Phos 38 - 126 U/L  189  197   AST 15 - 41 U/L  13  14   ALT 0 - 44 U/L  11  9      Imaging studies: No new pertinent imaging studies   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, 21 Days Post-Op s/p revision of end colostomy, partial colectomy, and repair of parastomal hernia for necrosis of previous end colostomy    - Continue soft diet   - Continue TPN; nutritional labs biweekly - I do think we can continue to wean this and potentially stop in next 24-48 hours  - Continue drains; monitor and record output strictly - Continue octreotide , loperamide   to decrease bowel secretions    - Monitor colostomy output; WOC on board - appreciate help   - Pain control prn; antiemetics prn   - Mobilize as tolerated; PT/OT on board    - Appreciate medicine assistance as well    - Discharge Planning; She is doing well. Fistula controlled with drains and output slowed significantly. Diet is advanced and tolerating. Needs to wean TPN as above. I do think we can start discharge planning as she may need SNF. Hopefully ready for discharge mid to late week.   All of the above findings and recommendations were discussed with the patient, patient's family (husband at bedside), and the medical team, and all of patient's and family's questions were answered to their expressed satisfaction.  -- Apolonio Bay, PA-C [facility] 12/07/2023, 7:26 AM M-F: 7am - 4pm

## 2023-12-08 ENCOUNTER — Inpatient Hospital Stay

## 2023-12-08 LAB — GLUCOSE, CAPILLARY
Glucose-Capillary: 127 mg/dL — ABNORMAL HIGH (ref 70–99)
Glucose-Capillary: 142 mg/dL — ABNORMAL HIGH (ref 70–99)
Glucose-Capillary: 145 mg/dL — ABNORMAL HIGH (ref 70–99)
Glucose-Capillary: 157 mg/dL — ABNORMAL HIGH (ref 70–99)

## 2023-12-08 LAB — CBC
HCT: 28 % — ABNORMAL LOW (ref 36.0–46.0)
Hemoglobin: 8.7 g/dL — ABNORMAL LOW (ref 12.0–15.0)
MCH: 27.4 pg (ref 26.0–34.0)
MCHC: 31.1 g/dL (ref 30.0–36.0)
MCV: 88.1 fL (ref 80.0–100.0)
Platelets: 646 10*3/uL — ABNORMAL HIGH (ref 150–400)
RBC: 3.18 MIL/uL — ABNORMAL LOW (ref 3.87–5.11)
RDW: 17.2 % — ABNORMAL HIGH (ref 11.5–15.5)
WBC: 15.9 10*3/uL — ABNORMAL HIGH (ref 4.0–10.5)
nRBC: 0 % (ref 0.0–0.2)

## 2023-12-08 MED ORDER — IOHEXOL 300 MG/ML  SOLN
100.0000 mL | Freq: Once | INTRAMUSCULAR | Status: AC | PRN
  Administered 2023-12-08: 100 mL via INTRAVENOUS

## 2023-12-08 MED ORDER — TRACE MINERALS CU-MN-SE-ZN 300-55-60-3000 MCG/ML IV SOLN
INTRAVENOUS | Status: AC
  Filled 2023-12-08: qty 313.6

## 2023-12-08 MED ORDER — IOHEXOL 9 MG/ML PO SOLN
500.0000 mL | Freq: Two times a day (BID) | ORAL | Status: DC | PRN
Start: 2023-12-08 — End: 2023-12-15
  Administered 2023-12-08: 500 mL via ORAL

## 2023-12-08 NOTE — [place] FL2 (Signed)
 [place]  MEDICAID FL2 LEVEL OF CARE FORM     IDENTIFICATION  Patient Name: Kristin Miller Birthdate: 1935/09/25 Sex: female Admission Date (Current Location): 11/13/2023  [facility] and [place] Number:  Chiropodist and Address:         Provider Number: [id number]  Attending Physician Name and Address:  Alben Alma, MD  Relative Name and Phone Number:       Current Level of Care: Hospital Recommended Level of Care: Skilled Nursing Facility Prior Approval Number:    Date Approved/Denied:   PASRR Number: [id number]  Discharge Plan: SNF    Current Diagnoses: Patient Active Problem List   Diagnosis Date Noted   Acute hypoxic respiratory failure (HCC) 11/24/2023   Acute on chronic heart failure with preserved ejection fraction (HFpEF) (HCC) 11/24/2023   Acute on chronic anemia 11/24/2023   Parastomal hernia 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 gangrene 10/27/2023   Colon cancer (HCC) 10/27/2023   History of colon cancer 10/06/2023   Neoplasm of digestive system 10/06/2023   Age-related osteoporosis with current pathol fracture of vertebra, sequela 07/21/2023   Compression fracture of body of thoracic vertebra (HCC) 07/20/2023   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   Genetic testing 08/28/2022   Other constipation 08/14/2022   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   Rectal bleeding 08/04/2022   Gastroesophageal reflux disease with esophagitis 08/04/2022   GI bleeding 08/03/2022   SBO (small bowel obstruction) (HCC) 08/03/2022   Essential hypertension 08/03/2022   S/P partial resection of colon 07/29/2022   Malnutrition (HCC) 07/05/2022   Colon adenocarcinoma (HCC) 07/04/2022   Iron  deficiency anemia 06/27/2022   Primary open angle glaucoma (POAG) of both eyes, moderate stage 05/14/2022   BP (high blood pressure) 11/15/2015   OP (osteoporosis) 11/15/2015   Primary cancer of lower-outer quadrant of right breast (HCC) 06/20/2015   Low back pain 08/02/2014    HTN (hypertension) 08/02/2014   Superficial basal cell carcinoma (BCC) of skin of left shoulder 07/31/2014   Abnormal finding on mammography 10/03/2013   Chronic bilateral low back pain without sciatica 03/29/2012    Orientation RESPIRATION BLADDER Height & Weight     Self, Time, Situation, Place  Normal Continent Weight: 65.4 kg Height:  5' (152.4 cm)  BEHAVIORAL SYMPTOMS/MOOD NEUROLOGICAL BOWEL NUTRITION STATUS      Colostomy Diet (Soft.   TPN will be stopped prior to discharge)  AMBULATORY STATUS COMMUNICATION OF NEEDS Skin   Limited Assist Verbally Surgical wounds, Bruising, Other (Comment) (JP x2)                       Personal Care Assistance Level of Assistance              Functional Limitations Info             SPECIAL CARE FACTORS FREQUENCY  PT (By licensed PT), OT (By licensed OT)                    Contractures Contractures Info: Not present    Additional Factors Info  Code Status, Allergies Code Status Info: Full Allergies Info: Combigan  (Brimonidine  Tartrate-timolol ), Lisinopril , Other           Current Medications (12/08/2023):  This is the current hospital active medication list Current Facility-Administered Medications  Medication Dose Route Frequency Provider Last Rate Last Admin   acetaminophen  (TYLENOL ) tablet 1,000 mg  1,000 mg Oral Q6H Pabon, Diego F, MD   1,000 mg at 12/08/23 [phone]  alteplase  (CATHFLO ACTIVASE ) injection 2 mg  2 mg Intracatheter Once Pabon, [place] F, MD       amLODipine  (NORVASC ) tablet 5 mg  5 mg Oral Daily Pabon, [place] F, MD   5 mg at 12/08/23 0865   Chlorhexidine  Gluconate Cloth 2 % PADS 6 each  6 each Topical Daily Pabon, [place] F, MD   6 each at 12/07/23 1248   diphenhydrAMINE  (BENADRYL ) 12.5 MG/5ML elixir 12.5 mg  12.5 mg Oral Q6H PRN Pabon, Diego F, MD       Or   diphenhydrAMINE  (BENADRYL ) injection 12.5 mg  12.5 mg Intravenous Q6H PRN Pabon, Diego F, MD       diphenoxylate -atropine  (LOMOTIL ) 2.5-0.025 MG  per tablet 2 tablet  2 tablet Oral QID Pabon, Diego F, MD   2 tablet at 12/08/23 0844   enoxaparin  (LOVENOX ) injection 40 mg  40 mg Subcutaneous Q24H Pabon, Diego F, MD   40 mg at 12/08/23 0849   feeding supplement (ENSURE ENLIVE / ENSURE PLUS) liquid 237 mL  237 mL Oral TID BM Pabon, Diego F, MD   237 mL at 12/07/23 1438   hydrALAZINE  (APRESOLINE ) injection 10 mg  10 mg Intravenous Q2H PRN Pabon, Marcial Setting, MD       insulin  aspart (novoLOG ) injection 0-9 Units  0-9 Units Subcutaneous Q6H Ramonita Burow, RPH   2 Units at 12/08/23 0039   iohexol  (OMNIPAQUE ) 9 MG/ML oral solution 500 mL  500 mL Oral BID PRN Evelia Hipp F, MD   500 mL at 12/08/23 0543   ipratropium-albuterol  (DUONEB) 0.5-2.5 (3) MG/3ML nebulizer solution 3 mL  3 mL Nebulization Q6H PRN Pabon, Diego F, MD       iron  sucrose (VENOFER ) 200 mg in sodium chloride  0.9 % 100 mL IVPB  200 mg Intravenous Weekly Pabon, Diego F, MD 440 mL/hr at 12/01/23 1124 200 mg at 12/01/23 1124   latanoprost  (XALATAN ) 0.005 % ophthalmic solution 1 drop  1 drop Both Eyes QHS Pabon, Diego F, MD   1 drop at 12/07/23 2140   loperamide  (IMODIUM ) capsule 4 mg  4 mg Oral Q6H Pabon, Diego F, MD   4 mg at 12/08/23 0539   methocarbamol  (ROBAXIN ) tablet 500 mg  500 mg Oral Q8H PRN Pabon, Cal[place] Catalina F, MD   500 mg at 11/24/23 1128   Or   methocarbamol  (ROBAXIN ) injection 500 mg  500 mg Intravenous Q8H PRN Pabon, Diego F, MD       morphine  (PF) 2 MG/ML injection 2 mg  2 mg Intravenous Q3H PRN Evelia Hipp F, MD   2 mg at 11/14/23 7846   octreotide  (SANDOSTATIN ) injection 200 mcg  200 mcg Intravenous Q8H Pabon, Diego F, MD   200 mcg at 12/08/23 0538   ondansetron  (ZOFRAN ) injection 4 mg  4 mg Intravenous Q6H PRN Pabon, Cal[place] Catalina F, MD   4 mg at 12/04/23 1435   Oral care mouth rinse  15 mL Mouth Rinse PRN Pabon, Diego F, MD       oxyCODONE  (Oxy IR/ROXICODONE ) immediate release tablet 5-10 mg  5-10 mg Oral Q4H PRN Pabon, Diego F, MD   10 mg at 11/24/23 1127   pantoprazole   (PROTONIX ) injection 40 mg  40 mg Intravenous Q12H Pabon, Calh[place] Catalina F, MD   40 mg at 12/08/23 9629   prochlorperazine  (COMPAZINE ) tablet 10 mg  10 mg Oral Q6H PRN Pabon, Diego F, MD       Or   prochlorperazine  (COMPAZINE ) injection 5-10 mg  5-10  mg Intravenous Q6H PRN Pabon, Diego F, MD       sodium chloride  flush (NS) 0.9 % injection 10-40 mL  10-40 mL Intracatheter Q12H Pabon, Diego F, MD   10 mL at 12/08/23 0845   sodium chloride  flush (NS) 0.9 % injection 10-40 mL  10-40 mL Intracatheter PRN Pabon, Calh[place] Catalina F, MD       sodium chloride  flush (NS) 0.9 % injection 5 mL  5 mL Intracatheter Q8H Myrlene Asper, DO   5 mL at 12/08/23 0540   TPN ADULT (ION)   Intravenous Continuous TPN Ramonita Burow, [facility] 35 mL/hr at 12/07/23 1800 New Bag at 12/07/23 1800     Discharge Medications: Please see discharge summary for a list of discharge medications.  Relevant Imaging Results:  Relevant Lab Results:   Additional Information ss [id number]  Loman Risk, RN

## 2023-12-08 NOTE — Consult Note (Signed)
 PHARMACY - TOTAL PARENTERAL NUTRITION CONSULT NOTE   Indication: Prolonged ileus  Patient Measurements: Height: 5' (152.4 cm) Weight: 65.4 kg (144 lb 2.9 oz) IBW/kg (Calculated) : 45.5 TPN AdjBW (KG): 51.8 Body mass index is 28.16 kg/m. Usual Weight: 64.9 kg  Assessment:  NE is a 88 yo female who presented with colostomy issues w separation of mucocutaneous junction and distal necrosis post parietal colectomy due to adenocarcinoma of colon. Previously colectomy was 4/8. They underwent revision of colostomy on 4/25. Pharmacy has been consulted to manage this patient's TPN.   Glucose / Insulin : BG 127-151 4 units of novolog  in the last 24 hours, sSSI q6h.  Electrolytes: Mg = 1.9, Mag Sulf 1g IV x 1 given outside of bag (5/19) Renal: Scr < 1 Hepatic: WNL Intake / Output;  Intake/Output Summary (Last 24 hours) at 12/08/2023 0830 Last data filed at 12/08/2023 0600 Gross per 24 hour  Intake 548.91 ml  Output 1190 ml  Net -641.09 ml    MIVF: No MIVF at this time    GI Imaging: 4/23: CT Abdomen Pelvis: Surgical changes from a left colectomy and left lower colostomy. The distal colostomy is abnormal. Beginning approximately 10 cm from the ostomy site the colon is very amorphous and is filled with fluid. There are abnormal gas collections around the bowel and there is a large collection of gas in the subcutaneous tissues. Findings very worrisome for necrosis of the distal 10 cm of colon. 4/30 CT abd/pel: Multiple fluid collections are noted throughout the peritoneal space, concerning for possible abscesses. 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, complicated by pertinent comorbid conditions   --2 new drains placed yesterday 5/2 by interventional radiology in 2 new right-sided fluid collections   Central access: 11/17/23 TPN start date: 11/17/23  Nutritional Goals: Goal TPN rate is 65 mL/hr (provides 87 g of protein and 1666 kcals per day)  RD Assessment: Estimated Needs Total Energy Estimated Needs: 1600-1800kcal/day Total Protein Estimated Needs: 80-90g/day Total Fluid Estimated Needs: 1.4-1.6L/day  Current Nutrition:  Soft diet  Plan:  ---Continue TPN at rate of 35 mL/hr (half rate) --- Electrolytes in TPN: Na 60mEq/L, K 75mEq/L, Ca 3mEq/L, Mg 5 mEq/L, and Phos 15. Cl:Ac 1:1 ---Add standard MVI and trace elements to TPN ---Continue Sensitive q6h SSI and adjust as needed  ---Chromium 10 mcg/day per dietary recommendations:  unable to provide at this time due to national backorder status, removed from TPN 11/24/23 ---Monitor TPN labs on Mon/Thurs  Thank you for involving pharmacy in this patient's care.   Ramonita Burow, PharmD Clinical Pharmacist 12/08/2023 8:29 AM

## 2023-12-08 NOTE — Progress Notes (Signed)
 Nutrition Follow-up  DOCUMENTATION CODES:   Not applicable  INTERVENTION:   -TPN management per pharmacy -Continue Ensure Enlive po BID, each supplement provides 350 kcal and 20 grams of protein.  -Magic cup TID with meals, each supplement provides 290 kcal and 9 grams of protein   NUTRITION DIAGNOSIS:   Inadequate oral intake related to altered GI function as evidenced by NPO status.  Progressing; advanced to PO diet  GOAL:   Patient will meet greater than or equal to 90% of their needs  Progressing   MONITOR:   PO intake, Supplement acceptance, Diet advancement, Labs, Weight trends, Skin, I & O's  REASON FOR ASSESSMENT:   Consult New TPN/TNA  ASSESSMENT:   88 y/o female with h/o HTN, breast cancer s/p lumpectomy/XRT, IDA, HTN, chronic back pain, colon cancer (s/p open low anterior resection with end colostomy, abdominal wall resection to include psoas and left transversus abdominus muscles and left oophorectomy 07/2022 & s/p XRT) complicated by SBO (medically managed 07/2022) and flank hernia (s/p robotic assisted laparoscopic left colectomy w/ end colostomy, repair parastomal hernia and repair enterotomy 10/27/2023) and ostomy necrosis s/p revision of end colostomy requiring partial colectomy and resiting of the end colostomy to the right of the abdominal wall & repair of parastomal hernia 4/25 complicated by fluid collections and 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  5/2- s/p IR drain placement x 2 5/12- advanced to clear liquid diet 5/15- advanced to full liquid diet 5/16- advanced to soft diet, IR drains removed 5/19- TPN decreased to 35 ml/hr  Reviewed I/O's: -641 ml x 24 hours and -897 ml since 11/24/23  UOP: 1.1 L x 24 hours  Drain output: 20 ml x 24 hours  Colostomy output: 20 ml x 24 hours  Pt unavailable at time of visit.   Pt currently on a soft diet. Noted minimal meal intake- 0-50%. Pt had been drinking Ensure supplements, but refused today's doses.   TPN decreased to  35 ml/hr on 12/07/23; regimen provides 897 kcals and 47 grams protein, meeting 56% of estimated kcal needs and 58% of estimated protein needs.   No wt loss over the past week.   Per TOC notes, likely ready for discharge by the end of the week.   Medications reviewed and include lomotil , lovenox , imodium , and protonix .   Labs reviewed: Na: 133, CBGS: 127-151 (inpatient orders for glycemic control are 0-9 units insulin  aspart every 6 hours).    Diet Order:   Diet Order             DIET SOFT Room service appropriate? Yes; Fluid consistency: Thin  Diet effective 1400                   EDUCATION NEEDS:   Education needs have been addressed  Skin:  Skin Assessment: Skin Integrity Issues: Skin Integrity Issues:: Incisions Incisions: closed abdomen  Last BM:  12/07/23 (70 ml via colostomy)  Height:   Ht Readings from Last 1 Encounters:  11/20/23 5' (1.524 m)    Weight:   Wt Readings from Last 1 Encounters:  12/08/23 65.4 kg    Ideal Body Weight:     BMI:  Body mass index is 28.16 kg/m.  Estimated Nutritional Needs:   Kcal:  1600-1800kcal/day  Protein:  80-90g/day  Fluid:  1.4-1.6L/day    Herschel Lords, RD, LDN, CDCES Registered Dietitian III Certified Diabetes Care and Education Specialist If unable to reach this RD, please use "RD Inpatient" group chat on secure chat between hours of  8am-4 pm daily

## 2023-12-08 NOTE — TOC Progression Note (Signed)
 Transition of Care [facility]) - Progression Note    Patient Details  Name: CALIANN LECKRONE MRN: [id number] Date of Birth: 1936-07-09  [facility] CM/SW Contact  Loman Risk, RN Phone Number: 12/08/2023, 10:05 AM  Clinical Narrative:     TPN at half rate. Per surgery anticipated patient will medically be ready for discharge at the end of the week  Fl2 updated Bed search extended       Expected Discharge Plan and Services                                               Social Determinants of Health (SDOH) Interventions SDOH Screenings   Food Insecurity: No Food Insecurity (11/14/2023)  Housing: Low Risk  (11/24/2023)  Transportation Needs: No Transportation Needs (11/14/2023)  Utilities: Not At Risk (11/14/2023)  Alcohol Screen: Low Risk  (09/25/2021)  Depression (PHQ2-9): Low Risk  (08/25/2023)  Financial Resource Strain: Low Risk  (01/09/2023)   Received from [facility], [facility]  Physical Activity: Inactive (09/25/2021)  Social Connections: Moderately Integrated (11/14/2023)  Stress: No Stress Concern Present (09/25/2021)  Tobacco Use: Low Risk  (11/13/2023)    Readmission Risk Interventions    11/20/2023    8:57 AM  Readmission Risk Prevention Plan  Transportation Screening Complete  PCP or Specialist Appt within 5-7 Days Complete  Medication Review (RN CM) Complete

## 2023-12-08 NOTE — Plan of Care (Signed)

## 2023-12-08 NOTE — Progress Notes (Addendum)
 [facility] SUR[facility]ICAL PROGRESS NOTE  Hospital Day(s): 25.   Post op day(s): 25 Days Post-Op.   Interval History:  Patient seen and examined No acute events or new complaints overnight.  Patient reports she is feeling well  No significant abdominal pain No emesis, fever, chills WBC 15.9K this AM Hgb to 8.7; improved from previous  Drains as follows...             - LUQ: 0 ccs              - RUQ: 20 ccs She is on soft diet Colostomy; with stool and gas She is ambulating/working with therapies   Vital signs in last 24 hours: [min-max] current  Temp:  [97.6 F (36.4 C)-98.3 F (36.8 C)] 98.3 F (36.8 C) (05/20 0513) Pulse Rate:  [69-87] 69 (05/20 0513) Resp:  [20] 20 (05/20 0513) BP: (143-146)/(62-66) 146/66 (05/20 0513) SpO2:  [95 %-97 %] 95 % (05/20 0513) Weight:  [65.4 kg] 65.4 kg (05/20 0513)     Height: 5' (152.4 cm) Weight: 65.4 kg BMI (Calculated): 28.16   Intake/Output last 2 shifts:  05/19 0701 - 05/20 0700 In: 548.9 [P.O.:180; I.V.:368.9] Out: 1190 [Urine:1100; Drains:20; Stool:70]   Physical Exam:  Constitutional: alert, cooperative and no distress  Respiratory: breathing non-labored at rest  Cardiovascular: regular rate and sinus rhythm  Gastrointestinal: soft, non-tender, and non-distended. Colostomy in right abdomen; gas and dark liquid in bag, drains x2 present; output slowed/stopped  Labs:     Latest Ref Rng & Units 12/08/2023    6:29 AM 12/05/2023    8:00 AM 12/03/2023    4:55 AM  CBC  WBC 4.0 - 10.5 K/uL 15.9  14.1  11.9   Hemoglobin 12.0 - 15.0 g/dL 8.7  7.8  8.1   Hematocrit 36.0 - 46.0 % 28.0  25.5  26.0   Platelets 150 - 400 K/uL 646  719  815       Latest Ref Rng & Units 12/07/2023    4:20 AM 12/05/2023    8:00 AM 12/03/2023    4:55 AM  CMP  Glucose 70 - 99 mg/dL 657  846  962   BUN 8 - 23 mg/dL 23  25  20    Creatinine 0.44 - 1.00 mg/dL 9.52  8.41  3.24   Sodium 135 - 145 mmol/L 133  133  136   Potassium 3.5 - 5.1 mmol/L  4.5  4.5  4.5   Chloride 98 - 111 mmol/L 100  103  101   CO2 22 - 32 mmol/L 25  24  26    Calcium 8.9 - 10.3 mg/dL 8.3  8.3  8.5   Total Protein 6.5 - 8.1 g/dL 6.9   7.1   Total Bilirubin 0.0 - 1.2 mg/dL 0.5   0.4   Alkaline Phos 38 - 126 U/L 166   189   AST 15 - 41 U/L 15   13   ALT 0 - 44 U/L 11   11      Imaging studies: No new pertinent imaging studies   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, 21 Days Post-Op s/p revision of end colostomy, partial colectomy, and repair of parastomal hernia for necrosis of previous end colostomy    - We will plan for CT Abdomen/Pelvis this morning to reassess intra-abdominal process as we approach disposition   - Continue soft diet   - Continue TPN; nutritional labs biweekly - now 1/2 rate,  we can continue this today pending CT findings   - Will remove staples prior to DC - Continue drains; monitor and record output strictly - Continue octreotide , loperamide  to decrease bowel secretions    - Monitor colostomy output; WOC on board - appreciate help   - Pain control prn; antiemetics prn   - Mobilize as tolerated; PT/OT on board    - Appreciate medicine assistance as well    - Discharge Planning; Okay to continue disposition planning. Anticipate discharge towards end of this week.   All of the above findings and recommendations were discussed with the patient, patient's family (husband at bedside), and the medical team, and all of patient's and family's questions were answered to their expressed satisfaction.  -- Apolonio Bay, PA-C [facility] 12/08/2023, 7:31 AM M-F: 7am - 4pm

## 2023-12-09 LAB — BASIC METABOLIC PANEL WITH GFR
Anion gap: 9 (ref 5–15)
BUN: 20 mg/dL (ref 8–23)
CO2: 25 mmol/L (ref 22–32)
Calcium: 8.4 mg/dL — ABNORMAL LOW (ref 8.9–10.3)
Chloride: 101 mmol/L (ref 98–111)
Creatinine, Ser: 0.54 mg/dL (ref 0.44–1.00)
GFR, Estimated: >60 mL/min (ref >60)
Glucose, Bld: 152 mg/dL — ABNORMAL HIGH (ref 70–99)
Potassium: 4.4 mmol/L (ref 3.5–5.1)
Sodium: 135 mmol/L (ref 135–145)

## 2023-12-09 LAB — PHOSPHORUS: Phosphorus: 2.9 mg/dL (ref 2.5–4.6)

## 2023-12-09 LAB — GLUCOSE, CAPILLARY
Glucose-Capillary: 106 mg/dL — ABNORMAL HIGH (ref 70–99)
Glucose-Capillary: 129 mg/dL — ABNORMAL HIGH (ref 70–99)
Glucose-Capillary: 149 mg/dL — ABNORMAL HIGH (ref 70–99)
Glucose-Capillary: 164 mg/dL — ABNORMAL HIGH (ref 70–99)

## 2023-12-09 LAB — MAGNESIUM: Magnesium: 1.9 mg/dL (ref 1.7–2.4)

## 2023-12-09 MED ORDER — ENOXAPARIN SODIUM 40 MG/0.4ML IJ SOSY
40.0000 mg | PREFILLED_SYRINGE | INTRAMUSCULAR | Status: DC
  Administered 2023-12-10 – 2023-12-14 (×5): 40 mg via SUBCUTANEOUS
  Filled 2023-12-09 (×5): qty 0.4

## 2023-12-09 MED ORDER — MAGNESIUM SULFATE IN D5W 1-5 GM/100ML-% IV SOLN
1.0000 g | Freq: Once | INTRAVENOUS | Status: AC
  Administered 2023-12-09: 1 g via INTRAVENOUS
  Filled 2023-12-09: qty 100

## 2023-12-09 MED ORDER — PANTOPRAZOLE SODIUM 40 MG PO TBEC
40.0000 mg | DELAYED_RELEASE_TABLET | Freq: Two times a day (BID) | ORAL | Status: DC
  Administered 2023-12-09 – 2023-12-15 (×12): 40 mg via ORAL
  Filled 2023-12-09 (×12): qty 1

## 2023-12-09 NOTE — Progress Notes (Signed)
 [place] [facility] SUR[facility]ICAL PROGRESS NOTE  Hospital Day(s): 26.   Post op day(s): 26 Days Post-Op.   Interval History:  Patient seen and examined No acute events or new complaints overnight.  Patient reports she is feeling well  Still feels quite weak  No significant abdominal pain No emesis, fever, chills Labs are reassuring  Drains as follows...             - LUQ: 0 ccs              - RUQ: 20 ccs She is on soft diet Colostomy; with stool and gas; 550 ccs + unmeasured  She is ambulating/working with therapies   Vital signs in last 24 hours: [min-max] current  Temp:  [98 F (36.7 C)-99.8 F (37.7 C)] 99.8 F (37.7 C) (05/21 0410) Pulse Rate:  [71-90] 90 (05/21 0410) Resp:  [17-20] 20 (05/21 0410) BP: (130-157)/(59-74) 157/74 (05/21 0410) SpO2:  [94 %-97 %] 97 % (05/21 0410) Weight:  [61.6 kg] 61.6 kg (05/21 0410)     Height: 5' (152.4 cm) Weight: 61.6 kg BMI (Calculated): 26.52   Intake/Output last 2 shifts:  05/20 0701 - 05/21 0700 In: 340.6 [I.V.:340.6] Out: 550 [Stool:550]   Physical Exam:  Constitutional: alert, cooperative and no distress  Respiratory: breathing non-labored at rest  Cardiovascular: regular rate and sinus rhythm  Gastrointestinal: soft, non-tender, and non-distended. Colostomy in right abdomen; gas and dark liquid in bag, drains x2 present; output slowed/stopped  Labs:     Latest Ref Rng & Units 12/08/2023    6:29 AM 12/05/2023    8:00 AM 12/03/2023    4:55 AM  CBC  WBC 4.0 - 10.5 K/uL 15.9  14.1  11.9   Hemoglobin 12.0 - 15.0 g/dL 8.7  7.8  8.1   Hematocrit 36.0 - 46.0 % 28.0  25.5  26.0   Platelets 150 - 400 K/uL 646  719  815       Latest Ref Rng & Units 12/07/2023    4:20 AM 12/05/2023    8:00 AM 12/03/2023    4:55 AM  CMP  Glucose 70 - 99 mg/dL 782  956  213   BUN 8 - 23 mg/dL 23  25  20    Creatinine 0.44 - 1.00 mg/dL 0.86  5.78  4.69   Sodium 135 - 145 mmol/L 133  133  136   Potassium 3.5 - 5.1 mmol/L 4.5  4.5  4.5    Chloride 98 - 111 mmol/L 100  103  101   CO2 22 - 32 mmol/L 25  24  26    Calcium 8.9 - 10.3 mg/dL 8.3  8.3  8.5   Total Protein 6.5 - 8.1 g/dL 6.9   7.1   Total Bilirubin 0.0 - 1.2 mg/dL 0.5   0.4   Alkaline Phos 38 - 126 U/L 166   189   AST 15 - 41 U/L 15   13   ALT 0 - 44 U/L 11   11      Imaging studies:   CT Abdomen/Pelvis (12/08/2023) personally reviewed and radiologist report reviewed below:  IMPRESSION: 1. Two postsurgical drains traverse a mixed gas and fluid collection in the anterior abdomen, measuring 3.9 x 13.2 x 14.8 cm (APxTRxCC). This fluid collection communicates with the subcutaneous tissues of the ventral abdominal wall (axial 56), possibly with a fistula extending to the surgical staple line. 2. Similar to slightly smaller, peripherally enhancing abscess subjacent to the hepatic tip measuring 4.1  x 2.4 x 1.9 cm. 3. The abscess along the greater curvature of the stomach is also smaller status post percutaneous drainage catheter removal, measuring 2.2 x 1.9 x 1.9 cm. A couple of additional abscesses in the gastrosplenic ligament are completely decompressed. 4. Tiny abscess in the lower left omentum measuring 1.2 cm, unchanged. 5. Moderate-sized sliding-type hiatal hernia.  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, 26 Days Post-Op s/p revision of end colostomy, partial colectomy, and repair of parastomal hernia for necrosis of previous end colostomy    - Continue soft diet   - We can stop TPN today   - Will remove staples prior to DC - Continue drains; monitor and record output strictly - Continue octreotide , loperamide  to decrease bowel secretions    - Monitor colostomy output; WOC on board - appreciate help   - Pain control prn; antiemetics prn   - Mobilize as tolerated; PT/OT on board    - Appreciate medicine assistance as well    - Discharge Planning; Okay to continue disposition planning; awaiting placement   All of the  above findings and recommendations were discussed with the patient, patient's family (husband at bedside), and the medical team, and all of patient's and family's questions were answered to their expressed satisfaction.  -- Apolonio Bay, PA-C [facility] 12/09/2023, 7:16 AM M-F: 7am - 4pm

## 2023-12-09 NOTE — Progress Notes (Signed)
 PHARMACIST - PHYSICIAN COMMUNICATION  DR:   Dana Duncan  CONCERNING: IV to Oral Route Change Policy  RECOMMENDATION: This patient is receiving pantoprazole  by the intravenous route.  Based on criteria approved by the Pharmacy and Therapeutics Committee, the intravenous medication(s) is/are being converted to the equivalent oral dose form(s).   DESCRIPTION: These criteria include: The patient is eating (either orally or via tube) and/or has been taking other orally administered medications for a least 24 hours The patient has no evidence of active gastrointestinal bleeding or impaired GI absorption (gastrectomy, short bowel, patient on TNA or NPO).  If you have questions about this conversion, please contact the Pharmacy Department    Ramonita Burow, [facility] 12/09/2023 8:20 AM

## 2023-12-09 NOTE — Consult Note (Signed)
 Chief Complaint:  RUQ fluid collection   Procedure: Percutaneous drain placement   Referring Provider(s): Dr. Earle Glatter   Supervising Physician: Elene Griffes  Patient Status: [facility] - In-pt  History of Present Illness: 88 year old female with a history of colon cancer who underwent a colon resection with ostomy formation secondary to colon cancer on 4/8 with Dr. Dana Duncan. Unfortunately, patient developed necrosis of the ostomy site and subsequently underwent colostomy revision with placement of 2 drains on 4/25. She continued to have some abdominal discomfort and weakness. CT A/P on 4/30 concerning for multiple intra-abdominal abscesses. IR consulted for placement of percutaneous abscess drains in lieu of a third surgical operation. On 5/2 patient underwent placement of 2 LUQ drains in IR with Dr. Wilnette Haste. On 5/16 IR drains (3&4) removed without difficulty. Patient was continuing to progress well and planning for discharge soon. Unfortunately her most recent CT A/P on 5/20 revealed a persistent fluid collection of the anterior abdomen in the setting of 2 post surgical drains. IR was consulted for possible aspiration vs drain placement. Images and case were reviewed and approved by Dr. Irine Manning.  Patient currently ambulating back into the room with mobility specialist on arrival to room. States that she has been feeling much better overall. She denies any abdominal pain, difficulty eating, or fever/chills. All questions and concerns answered at the bedside.   Patient is Full Code  Past Medical History:  Diagnosis Date   Allergy    Anemia    H/O AS A CHILD   Arthritis    Back pain    Breast cancer of upper-outer quadrant of right female breast (HCC) 06/12/2015   4 millimeter, T1a, N0; ER 90%, PR 0%, HER-2/neu not overexpressed.  Declined radiation therapy.   Collar bone fracture 05/28/2013   Fractured Collar bone on left   Dyspnea    Dysrhythmia    h/o heart skipping beat years ago    Frequent headaches    h/o migraines   Hypertension    Osteopenia 04/2018   Bone density without significant interval change since 2017.   Pneumonia     Past Surgical History:  Procedure Laterality Date   ABDOMINAL HYSTERECTOMY  1970   Partial   BREAST BIOPSY Right 05/28/2015   INVASIVE MAMMARY CARCINOMA    BREAST BIOPSY Right 06/12/2015   Wide excision, sentinel lymph node biopsy.   BREAST EXCISIONAL BIOPSY Right    BREAST LUMPECTOMY WITH AXILLARY LYMPH NODE DISSECTION Right 06/12/2015   4 mm, T1a,N0 withDCIS with clear margins. ER: 90%, PR 0%; Her 2 neu not overexpressed.    CATARACT EXTRACTION W/PHACO Left 02/03/2017   Procedure: CATARACT EXTRACTION PHACO AND INTRAOCULAR LENS PLACEMENT (IOC);  Surgeon: Clair Crews, MD;  Location: [facility];  Service: Ophthalmology;  Laterality: Left;  US   00:28 AP% 16.3 CDE 4.68 Fluid pack lot # [id number]   CATARACT EXTRACTION W/PHACO Right 03/01/2019   Procedure: CATARACT EXTRACTION PHACO AND INTRAOCULAR LENS PLACEMENT (IOC)  RIGHT;  Surgeon: Clair Crews, MD;  Location: [facility];  Service: Ophthalmology;  Laterality: Right;   COLONOSCOPY     COLONOSCOPY WITH PROPOFOL  N/A 06/27/2022   Procedure: COLONOSCOPY WITH PROPOFOL ;  Surgeon: Marnee Sink, MD;  Location: [facility];  Service: Endoscopy;  Laterality: N/A;   COLONOSCOPY WITH PROPOFOL  N/A 10/06/2023   Procedure: COLONOSCOPY WITH PROPOFOL ;  Surgeon: Marnee Sink, MD;  Location: [facility];  Service: Endoscopy;  Laterality: N/A;   COLOSTOMY REVISION N/A 11/13/2023   Procedure: REVISION, COLOSTOMY;  Surgeon: Alben Alma, MD;  Location: [facility];  Service: General;  Laterality: N/A;   ESOPHAGOGASTRODUODENOSCOPY (EGD) WITH PROPOFOL  N/A 06/27/2022   Procedure: ESOPHAGOGASTRODUODENOSCOPY (EGD) WITH PROPOFOL ;  Surgeon: Marnee Sink, MD;  Location: [facility];  Service: Endoscopy;  Laterality: N/A;   PARTIAL COLECTOMY N/A 07/29/2022   Procedure: PARTIAL  COLECTOMY, open left, RNFA to assist;  Surgeon: Alben Alma, MD;  Location: [facility];  Service: General;  Laterality: N/A;   POLYPECTOMY  10/06/2023   Procedure: POLYPECTOMY;  Surgeon: Marnee Sink, MD;  Location: [facility];  Service: Endoscopy;;    Allergies: Combigan  [brimonidine  tartrate-timolol ], Lisinopril , and Other  Medications: Prior to Admission medications   Medication Sig Start Date End Date Taking? Authorizing Provider  acetaminophen  (TYLENOL ) 500 MG tablet Take 500 mg by mouth every 6 (six) hours as needed for pain.   Yes [provider]  alendronate  (FOSAMAX ) 70 MG tablet Take 1 tablet (70 mg total) by mouth every 7 (seven) days for 48 doses. Take with a full glass of water  on an empty stomach. 07/21/23 06/15/24 Yes Ziglar, Susan K, MD  amLODipine  (NORVASC ) 5 MG tablet Take 1 tablet (5 mg total) by mouth daily. 07/28/23  Yes Ziglar, Susan K, MD  docusate sodium (COLACE) 100 MG capsule Take 100 mg by mouth daily as needed for mild constipation.   Yes [provider]  latanoprost  (XALATAN ) 0.005 % ophthalmic solution 1 drop at bedtime. Eye doctor both eyes 12/18/20  Yes [provider]  loperamide  (IMODIUM ) 2 MG capsule Take 1 capsule (2 mg total) by mouth as needed for diarrhea or loose stools. May take 1-2 capsules every 8 hours 11/09/23  Yes Pabon, Diego F, MD  losartan -hydrochlorothiazide  (HYZAAR) 100-12.5 MG tablet Take 1 tablet by mouth daily. 07/21/23  Yes Ziglar, Susan K, MD  metroNIDAZOLE  (FLAGYL ) 500 MG tablet Take 2 tabs (1000 mg total) at 8 am, again at 2 pm, and again at 8 pm the day prior to surgery. 11/11/23  Yes Pabon, Diego F, MD  Multiple Minerals-Vitamins (CAL MAG ZINC +D3 PO) Take 1 tablet by mouth daily.   Yes [provider]  neomycin  (MYCIFRADIN ) 500 MG tablet Take 2 tabs (1000 mg total) at 8 am, again at 2 pm, and again at 8 pm the day prior to surgery. 11/11/23  Yes Pabon, Diego F, MD  oxyCODONE  (OXY IR/ROXICODONE ) 5 MG  immediate release tablet Take 1 tablet (5 mg total) by mouth every 4 (four) hours as needed for severe pain (pain score 7-10). 11/09/23  Yes Pabon, Diego F, MD  potassium chloride  SA (KLOR-CON  M) 20 MEQ tablet Take 2 tablets (40 mEq total) by mouth daily. 11/09/23 12/09/23 Yes Pabon, Marcial Setting, MD     Family History  Problem Relation Age of Onset   Mental illness Mother        Dementia   Stroke Father    Lung cancer Brother    Breast cancer Other        dx under 59   Brain cancer Granddaughter 75       glioblastoma    Social History   Socioeconomic History   Marital status: Married    Spouse name: ,Nicholette Barley   Number of children: 3   Years of education: GED   Highest education level: 12th grade  Occupational History   Occupation: Retired  Tobacco Use   Smoking status: Never    Passive exposure: Never   Smokeless tobacco: Never   Tobacco comments:  smoking cessation materials not required  Vaping Use   Vaping status: Never Used  Substance and Sexual Activity   Alcohol use: No   Drug use: No   Sexual activity: Not Currently  Other Topics Concern   Not on file  Social History Narrative   Not on file   Social Drivers of Health   Financial Resource Strain: Low Risk  (01/09/2023)   Received from [facility], [facility]   Overall Financial Resource Strain (CARDIA)    Difficulty of Paying Living Expenses: Not very hard  Food Insecurity: No Food Insecurity (11/14/2023)   Hunger Vital Sign    Worried About Running Out of Food in the Last Year: Never true    Ran Out of Food in the Last Year: Never true  Transportation Needs: No Transportation Needs (11/14/2023)   PRAPARE - Administrator, Civil Service (Medical): No    Lack of Transportation (Non-Medical): No  Physical Activity: Inactive (09/25/2021)   Exercise Vital Sign    Days of Exercise per Week: 0 days    Minutes of Exercise per Session: 0 min  Stress: No Stress Concern  Present (09/25/2021)   [employer] of Occupational Health - Occupational Stress Questionnaire    Feeling of Stress : Not at all  Social Connections: Moderately Integrated (11/14/2023)   Social Connection and Isolation Panel [NHANES]    Frequency of Communication with Friends and Family: More than three times a week    Frequency of Social Gatherings with Friends and Family: More than three times a week    Attends Religious Services: More than 4 times per year    Active Member of [employer] or Organizations: No    Attends Banker Meetings: Never    Marital Status: Married     Review of Systems Patient denies any headache, chest pain, shortness of breath, abdominal pain, N/V, or fever/chills. All other systems are negative.   Vital Signs: BP (!) 141/61 (BP Location: Right Leg)   Pulse 77   Temp 99 F (37.2 C)   Resp 17   Ht 5' (1.524 m)   Wt 135 lb 12.9 oz (61.6 kg)   SpO2 95%   BMI 26.52 kg/m   Physical Exam Vitals reviewed.  Constitutional:      Appearance: Normal appearance.  HENT:     Head: Normocephalic and atraumatic.     Mouth/Throat:     Mouth: Mucous membranes are moist.     Pharynx: Oropharynx is clear.  Cardiovascular:     Rate and Rhythm: Normal rate and regular rhythm.  Pulmonary:     Effort: Pulmonary effort is normal.     Breath sounds: Normal breath sounds.  Abdominal:     General: Abdomen is flat.     Palpations: Abdomen is soft.     Tenderness: There is abdominal tenderness (minimal overlying surgical incision site).     Comments: Colostomy in place with scant output RUQ drain with trace brown fluid L abdominal drain w/ no output Incision site with staples in place; healing well; appropriately tender   Musculoskeletal:        General: Normal range of motion.     Cervical back: Normal range of motion.  Skin:    General: Skin is warm and dry.  Neurological:     General: No focal deficit present.     Mental Status: She is alert and  oriented to person, place, and time. Mental status is at  baseline.  Psychiatric:        Mood and Affect: Mood normal.        Behavior: Behavior normal.        Judgment: Judgment normal.     Imaging: CT ABDOMEN PELVIS W CONTRAST Result Date: 12/08/2023 CLINICAL DATA:  colostomy revision, complicated by fistula controlled with drains, CT to reassess EXAM: CT ABDOMEN AND PELVIS WITH CONTRAST TECHNIQUE: Multidetector CT imaging of the abdomen and pelvis was performed using the standard protocol following bolus administration of intravenous contrast. RADIATION DOSE REDUCTION: This exam was performed according to the departmental dose-optimization program which includes automated exposure control, adjustment of the mA and/or kV according to patient size and/or use of iterative reconstruction technique. CONTRAST:  OMNIPAQUE  IOHEXOL  300 MG/ML  SOLN COMPARISON:  Nov 25, 2023, Nov 20, 2023, November 18, 2023 FINDINGS: Lower chest: No focal airspace consolidation. Interval resolution of the small bilateral pleural effusions.Posterior bibasilar dependent atelectasis. Hepatobiliary: Small cyst in the anterior right hepatic dome. Diffusely distended and fluid-filled gallbladder without radiopaque stones or wall thickening. No intrahepatic or extrahepatic biliary ductal dilation. The portal veins are patent. Pancreas: No mass or main ductal dilation. No peripancreatic inflammation or fluid collection. Spleen: Normal size. No mass. Adrenals/Urinary Tract: Nodular thickening of the adrenal glands without discrete mass or dominant nodules. No renal mass. No nephrolithiasis or hydronephrosis. The urinary bladder is completely decompressed. Stomach/Bowel: Moderate-sized sliding-type hiatal hernia. The stomach is decompressed without focal abnormality. No small bowel obstruction. Postsurgical changes of a prior colectomy with a right lower quadrant colostomy. Enteric contrast filling the residual cecum and the colostomy tract  to the stomal surface. No extravasation of enteric contrast to suggest bowel perforation. Short Hartmann's pouch within the pelvis. Vascular/Lymphatic: No aortic aneurysm. Diffuse aortoiliac atherosclerosis. No intraabdominal or pelvic lymphadenopathy. Reproductive: Hysterectomy. No concerning adnexal mass.No free pelvic fluid. Other: No pneumoperitoneum. Two postsurgical drains traverse a mixed gas and fluid collection in the anterior abdomen, which measures 3.9 x 13.2 x 14.8 cm (APxTRxCC). This fluid collection communicates with the subcutaneous tissues of the ventral abdominal wall (axial 56), possibly extending to the surgical staple line. Peripherally enhancing thick-walled abscess subjacent to the hepatic tip measuring 4.1 x 2.4 x 1.9 cm (axial 37) similar to slightly smaller in the interim. Another abscess in the left upper abdomen measures 2.9 x 7.4 x 4 cm, unchanged, with extension along the anterior left hepatic lobe, measuring 1.9 cm (axial 23). A mixed gas and fluid collection, also likely an abscess present along the greater curvature of the stomach measuring 2.2 x 1.9 x 1.9 cm (axial 30) smaller status post percutaneous drainage catheter removal. A couple of additional small abscesses in the gastrosplenic ligament are completely decompressed. There is also a tiny fluid collection in the lower left omentum (axial 61), measuring 1.2 cm, unchanged. Musculoskeletal: No acute fracture or destructive lesion. Chronic severe compression fracture of T12. Diffuse osteopenia. IMPRESSION: 1. Two postsurgical drains traverse a mixed gas and fluid collection in the anterior abdomen, measuring 3.9 x 13.2 x 14.8 cm (APxTRxCC). This fluid collection communicates with the subcutaneous tissues of the ventral abdominal wall (axial 56), possibly with a fistula extending to the surgical staple line. 2. Similar to slightly smaller, peripherally enhancing abscess subjacent to the hepatic tip measuring 4.1 x 2.4 x 1.9 cm. 3.  The abscess along the greater curvature of the stomach is also smaller status post percutaneous drainage catheter removal, measuring 2.2 x 1.9 x 1.9 cm. A couple of additional abscesses  in the gastrosplenic ligament are completely decompressed. 4. Tiny abscess in the lower left omentum measuring 1.2 cm, unchanged. 5. Moderate-sized sliding-type hiatal hernia. Electronically Signed   By: Rance Burrows M.D.   On: 12/08/2023 20:58   CT ABDOMEN PELVIS W CONTRAST Addendum Date: 11/25/2023 ADDENDUM REPORT: 11/25/2023 16:30 ADDENDUM: These results were called by telephone at the time of interpretation on 11/25/2023 at 4:30 pm to provider DIEGO PABON , who verbally acknowledged these results. Electronically Signed   By: Angus Bark M.D.   On: 11/25/2023 16:30   Result Date: 11/25/2023 CLINICAL DATA:  History of enterocutaneous fistula and fluid collections. EXAM: CT ABDOMEN AND PELVIS WITH CONTRAST TECHNIQUE: Multidetector CT imaging of the abdomen and pelvis was performed using the standard protocol following bolus administration of intravenous contrast. RADIATION DOSE REDUCTION: This exam was performed according to the departmental dose-optimization program which includes automated exposure control, adjustment of the mA and/or kV according to patient size and/or use of iterative reconstruction technique. CONTRAST:  OMNIPAQUE  IOHEXOL  300 MG/ML  SOLN COMPARISON:  CT abdomen pelvis dated 11/18/2023. FINDINGS: Lower chest: Small bilateral pleural effusions, significantly decreased since the prior CT. Bibasilar subsegmental atelectasis or pneumonia. Hepatobiliary: Small cyst in the dome of the liver. No biliary dilatation. The gallbladder is unremarkable. Pancreas: Unremarkable. No pancreatic ductal dilatation or surrounding inflammatory changes. Spleen: Normal in size without focal abnormality. Adrenals/Urinary Tract: The adrenal glands are unremarkable. There is no hydronephrosis on either side. There is  symmetric enhancement and excretion of contrast by both kidneys. The visualized ureters and urinary bladder appear unremarkable. Stomach/Bowel: There is a moderate size hiatal hernia. Postsurgical changes of bowel with a right lower quadrant ostomy. No bowel obstruction. Vascular/Lymphatic: Moderate aortoiliac atherosclerotic disease. The IVC is unremarkable. No portal venous gas. No adenopathy. Reproductive: Hysterectomy. Other: Interval placement of a left anterior pigtail catheter with tip in the collection adjacent to ostomy. Two additional peritoneal drainage catheters with tips in similar position as the prior CT. There is a large collection in the anterior peritoneum containing air and contrast. In the absence of injected contrast through the catheters findings consistent with bowel perforation with a moderate-sized pneumoperitoneum and large amount of extraluminal contrast. Extraluminal fecalized content was noted in the pelvis on the prior CT. There is a loculated collection adjacent to the hiatal hernia containing air and fluid and measuring approximately 3.1 x 6.5 cm. Multiloculated collection with pockets of air in the upper abdomen has slightly decreased in size status post drainage catheter placement. Additional loculated collection in the left anterior abdomen abutting the left lobe of the liver measures approximately 2.7 x 7.0 cm and increased in size since the prior CT (previously approximately 2.0 x 4.1 cm). Musculoskeletal: Osteopenia with degenerative changes. No acute osseous pathology. Old T12 compression fracture with near complete loss of vertebral body height and anterior wedging. IMPRESSION: 1. Large intraperitoneal collection in the anterior pelvis containing air and contrast most consistent with perforation likely arising from the colostomy. Surgical consult is advised. 2. Additional loculated collections/abscesses as above. 3. Small bilateral pleural effusions, decreased since the prior  CT. Bibasilar subsegmental atelectasis or pneumonia. 4.  Aortic Atherosclerosis (ICD10-I70.0). Electronically Signed: By: Angus Bark M.D. On: 11/25/2023 16:03   CT GUIDED PERITONEAL/RETROPERITONEAL FLUID DRAIN BY PERC CATH Result Date: 11/24/2023 INDICATION: 88 year old female referred for drainage of abdominal fluid EXAM: CT-GUIDED DRAINAGE OF ABDOMINAL FLUID TECHNIQUE: Multidetector CT imaging of the abdomen was performed following the standard protocol without IV contrast. RADIATION DOSE REDUCTION: This  exam was performed according to the departmental dose-optimization program which includes automated exposure control, adjustment of the mA and/or kV according to patient size and/or use of iterative reconstruction technique. MEDICATIONS: The patient is currently admitted to the hospital and receiving intravenous antibiotics. The antibiotics were administered within an appropriate time frame prior to the initiation of the procedure. ANESTHESIA/SEDATION: Moderate (conscious) sedation was employed during this procedure. A total of Versed  0.5 mg and Fentanyl  25 mcg was administered intravenously by the radiology nurse. Total intra-service moderate Sedation Time: 22 minutes. The patient's level of consciousness and vital signs were monitored continuously by radiology nursing throughout the procedure under my direct supervision. COMPLICATIONS: None PROCEDURE: Informed written consent was obtained from the patient after a thorough discussion of the procedural risks, benefits and alternatives. All questions were addressed. Maximal Sterile Barrier Technique was utilized including caps, mask, sterile gowns, sterile gloves, sterile drape, hand hygiene and skin antiseptic. A timeout was performed prior to the initiation of the procedure. Patient was positioned supine on the CT gantry table. Scout CT acquired for planning purposes. After review of the images, 2 sites of potential drainage were identified. Grade  placement was applied for targeting these 2 separate sites and repeat imaging was performed. We elected to drain them in serial, first the superior and then the inferior. The patient is prepped and draped in the usual sterile fashion. 1% lidocaine  was used for local anesthesia. A Yueh needle was advanced into the fluid collection along the greater curvature of the stomach. Using modified Seldinger technique, a 10 French drain was placed. Drain was sutured in position and attached to suction. We then proceeded with drainage of the more inferior. Using CT guidance, Yueh needle was advanced into the fluid collection, once we confirmed needle tip position modified Seldinger technique was used to place a 10 [place] drain. The drain was sutured in position and attached to bulb suction. Final CT images were acquired confirming location. Patient tolerated the procedure well and remained hemodynamically stable throughout. No complications were encountered and no significant blood loss. IMPRESSION: Status post CT-guided drainage 2 separate fluid collection within the abdomen as above. Samples were sent from both location. Signed, Marciano Settles. Rexine Cater, RPVI [facility] Radiology Electronically Signed   By: Myrlene Asper D.O.   On: 11/24/2023 08:32   CT GUIDED PERITONEAL/RETROPERITONEAL FLUID DRAIN BY PERC CATH Result Date: 11/24/2023 INDICATION: 88 year old female referred for drainage of abdominal fluid EXAM: CT-GUIDED DRAINAGE OF ABDOMINAL FLUID TECHNIQUE: Multidetector CT imaging of the abdomen was performed following the standard protocol without IV contrast. RADIATION DOSE REDUCTION: This exam was performed according to the departmental dose-optimization program which includes automated exposure control, adjustment of the mA and/or kV according to patient size and/or use of iterative reconstruction technique. MEDICATIONS: The patient is currently admitted to the  hospital and receiving intravenous antibiotics. The antibiotics were administered within an appropriate time frame prior to the initiation of the procedure. ANESTHESIA/SEDATION: Moderate (conscious) sedation was employed during this procedure. A total of Versed  0.5 mg and Fentanyl  25 mcg was administered intravenously by the radiology nurse. Total intra-service moderate Sedation Time: 22 minutes. The patient's level of consciousness and vital signs were monitored continuously by radiology nursing throughout the procedure under my direct supervision. COMPLICATIONS: None PROCEDURE: Informed written consent was obtained from the patient after a thorough discussion of the procedural risks, benefits and alternatives. All questions were addressed. Maximal Sterile Barrier Technique was utilized including caps, mask, sterile  gowns, sterile gloves, sterile drape, hand hygiene and skin antiseptic. A timeout was performed prior to the initiation of the procedure. Patient was positioned supine on the CT gantry table. Scout CT acquired for planning purposes. After review of the images, 2 sites of potential drainage were identified. Grade placement was applied for targeting these 2 separate sites and repeat imaging was performed. We elected to drain them in serial, first the superior and then the inferior. The patient is prepped and draped in the usual sterile fashion. 1% lidocaine  was used for local anesthesia. A Yueh needle was advanced into the fluid collection along the greater curvature of the stomach. Using modified Seldinger technique, a 10 French drain was placed. Drain was sutured in position and attached to suction. We then proceeded with drainage of the more inferior. Using CT guidance, Yueh needle was advanced into the fluid collection, once we confirmed needle tip position modified Seldinger technique was used to place a 10 [place] drain. The drain was sutured in position and attached to bulb suction. Final CT images  were acquired confirming location. Patient tolerated the procedure well and remained hemodynamically stable throughout. No complications were encountered and no significant blood loss. IMPRESSION: Status post CT-guided drainage 2 separate fluid collection within the abdomen as above. Samples were sent from both location. Signed, Marciano Settles. Rexine Cater, RPVI [facility] Radiology Electronically Signed   By: Myrlene Asper D.O.   On: 11/24/2023 08:32   US  Venous Img Lower Unilateral Left (DVT) Result Date: 11/21/2023 CLINICAL DATA:  Edema EXAM: LEFT LOWER EXTREMITY VENOUS DOPPLER ULTRASOUND TECHNIQUE: Gray-scale sonography with compression, as well as color and duplex ultrasound, were performed to evaluate the deep venous system(s) from the level of the common femoral vein through the popliteal and proximal calf veins. COMPARISON:  None available FINDINGS: VENOUS Normal compressibility of the common femoral, superficial femoral, and popliteal veins, as well as the visualized calf veins. Visualized portions of profunda femoral vein and great saphenous vein unremarkable. No filling defects to suggest DVT on grayscale or color Doppler imaging. Doppler waveforms show normal direction of venous flow, normal respiratory plasticity and response to augmentation. Limited views of the contralateral common femoral vein are unremarkable. OTHER None. Limitations: none IMPRESSION: No DVT of the left lower extremity. Electronically Signed   By: Elester Grim M.D.   On: 11/21/2023 07:00   DG Chest Port 1 View Result Date: 11/20/2023 CLINICAL DATA:  Shortness of breath. EXAM: PORTABLE CHEST 1 VIEW COMPARISON:  May 26, 2016. FINDINGS: Mild cardiomegaly is noted. Left-sided PICC line is noted with distal tip in expected position of the SVC. Hypoinflation of the lungs is noted. Minimal left basilar atelectasis or infiltrate is noted with small pleural effusion. No definite  pneumothorax. Bony thorax is unremarkable. IMPRESSION: Minimal left basilar atelectasis or infiltrate is noted with small pleural effusion. Electronically Signed   By: Rosalene Colon M.D.   On: 11/20/2023 17:24   CT ABDOMEN PELVIS W CONTRAST Result Date: 11/18/2023 CLINICAL DATA:  Postoperative status. History of enterocutaneous fistula. EXAM: CT ABDOMEN AND PELVIS WITH CONTRAST TECHNIQUE: Multidetector CT imaging of the abdomen and pelvis was performed using the standard protocol following bolus administration of intravenous contrast. RADIATION DOSE REDUCTION: This exam was performed according to the departmental dose-optimization program which includes automated exposure control, adjustment of the mA and/or kV according to patient size and/or use of iterative reconstruction technique. CONTRAST:  OMNIPAQUE  IOHEXOL  300 MG/ML  SOLN COMPARISON:  November 11, 2023. FINDINGS: Lower chest: Small bilateral pleural effusions are noted with adjacent subsegmental atelectasis. Large hiatal hernia is again noted. Hepatobiliary: No cholelithiasis or biliary dilatation is noted. Stable right hepatic cyst. Pancreas: Unremarkable. No pancreatic ductal dilatation or surrounding inflammatory changes. Spleen: Normal in size without focal abnormality. Adrenals/Urinary Tract: Adrenal glands are unremarkable. Kidneys are normal, without renal calculi, focal lesion, or hydronephrosis. Bladder is unremarkable. Stomach/Bowel: No small bowel dilatation is noted. There has been colostomy revision with new colostomy placed in right lower quadrant. Two surgical drains are noted, 1 with distal tip in left lower quadrant and the other in right upper quadrant. Vascular/Lymphatic: Aortic atherosclerosis. No enlarged abdominal or pelvic lymph nodes. Reproductive: Status post hysterectomy. No adnexal masses. Other: Pneumoperitoneum is noted consistent with recent surgery. 8.4 x 4.2 cm fluid collection is noted along the greater curvature of  the stomach concerning for possible abscess. 6.6 x 2.2 cm ill-defined fluid collection is developing in the left iliac fossa. 4.2 x 2.0 cm fluid collection is seen anteriorly in left upper quadrant. 4.8 x 2.5 cm fluid collection is seen laterally in right upper quadrant. 8.6 x 3.8 cm collection is noted in right lower quadrant and pelvis which contains both contrast and stool. It is uncertain if this represents lumen or extravasation. Musculoskeletal: Stable old T12 fracture. No acute osseous abnormality is noted. IMPRESSION: Large sliding-type hiatal hernia. Small bilateral pleural effusions with adjacent subsegmental atelectasis. Status post revision of colostomy, with new colostomy seen in right lower quadrant. Two large surgical drains are present as described above. Multiple fluid collections are noted throughout the peritoneal space as noted above. This includes 8.4 x 4.2 cm fluid collection or abscess along the greater curvature of the stomach. 6.6 x 2.2 cm ill-defined fluid collection is developing in left iliac fossa. 4.2 x 2.0 cm fluid collection is noted anteriorly in left upper quadrant. 4.8 x 2.5 cm fluid collection is seen laterally in right upper quadrant. These are concerning for possible abscesses. Also noted is a 0.6 x 3.8 cm collection of contrast and probable stool seen in right lower quadrant in pelvis; is uncertain if this represents dilated colon or potentially extravasated material; attention on follow-up imaging is recommended. Aortic Atherosclerosis (ICD10-I70.0). Electronically Signed   By: Rosalene Colon M.D.   On: 11/18/2023 08:38   IR PICC PLACEMENT LEFT >5 YRS INC IMG GUIDE Result Date: 11/17/2023 INDICATION: Patient with recent colostomy revision. IR consulted for PICC line placement for TPN. EXAM: ULTRASOUND AND FLUOROSCOPIC GUIDED PICC LINE INSERTION MEDICATIONS: 3 mL 1% lidocaine . CONTRAST:  None FLUOROSCOPY TIME:  (1.91 mGy) COMPLICATIONS: None immediate. TECHNIQUE: The  procedure, risks, benefits, and alternatives were explained to the patient and informed written consent was obtained. A timeout was performed prior to the initiation of the procedure. The left upper extremity was prepped with chlorhexidine  in a sterile fashion, and a sterile drape was applied covering the operative field. Maximum barrier sterile technique with sterile gowns and gloves were used for the procedure. A timeout was performed prior to the initiation of the procedure. Local anesthesia was provided with 1% lidocaine . Under direct ultrasound guidance, the brachial vein was accessed with a micropuncture kit after the overlying soft tissues were anesthetized with 1% lidocaine . Real-time ultrasound guidance was utilized for vascular access including the acquisition of a permanent ultrasound image documenting patency of the accessed vessel. A guidewire was advanced to the level of the superior caval-atrial junction for measurement purposes and the PICC line was  cut to length. A peel-away sheath was placed and a 38 cm, 5 [place], dual lumen was inserted to level of the superior caval-atrial junction. A post procedure spot fluoroscopic was obtained. The catheter easily aspirated and flushed and was secured in place with stat lock device. A dressing was applied. The patient tolerated the procedure well without immediate post procedural complication. FINDINGS: After catheter placement, the tip lies within the superior cavoatrial junction. The catheter aspirates and flushes normally and is ready for immediate use. IMPRESSION: Successful ultrasound and fluoroscopic guided placement of a left brachial vein approach, 38 cm, 5 French, dual lumen PICC with tip at the superior caval-atrial junction. The PICC line is ready for immediate use. Performed by Ashley Blades PA-C with assistance from Dr. Elene Griffes Electronically Signed   By: Elene Griffes M.D.   On: 11/17/2023 13:00   US  EKG SITE RITE Result Date: 11/15/2023 If  Site Rite image not attached, placement could not be confirmed due to current cardiac rhythm.  US  EKG SITE RITE Result Date: 11/14/2023 If Site Rite image not attached, placement could not be confirmed due to current cardiac rhythm.  CT ABDOMEN PELVIS W CONTRAST Result Date: 11/11/2023 CLINICAL DATA:  History of recurrent colon cancer with left colectomy and end colostomy. Assess treatment response. * Tracking Code: BO * EXAM: CT ABDOMEN AND PELVIS WITH CONTRAST TECHNIQUE: Multidetector CT imaging of the abdomen and pelvis was performed using the standard protocol following bolus administration of intravenous contrast. RADIATION DOSE REDUCTION: This exam was performed according to the departmental dose-optimization program which includes automated exposure control, adjustment of the mA and/or kV according to patient size and/or use of iterative reconstruction technique. CONTRAST:  OMNIPAQUE  IOHEXOL  300 MG/ML  SOLN COMPARISON:  CT scan 10/22/2023 FINDINGS: Lower chest: The lung bases are clear of an acute process. No pulmonary lesions pulmonary nodules. Stable large hiatal hernia. Hepatobiliary: Stable small cysts. No worrisome hepatic lesions to suggest metastatic disease. No intrahepatic biliary dilatation. The gallbladder is unremarkable. Normal caliber and course of the common bile duct. Pancreas: No mass, inflammation or ductal dilatation. Spleen: Normal in size without focal abnormality. Adrenals/Urinary Tract: The adrenal glands are normal. No renal lesions or hydronephrosis. Stomach/Bowel: Large hiatal hernia. The duodenum and small bowel are unremarkable. Surgical changes from a left colectomy and left lower colostomy. The distal colostomy is abnormal. Beginning approximately 10 cm from the ostomy site the colon is very amorphous and is filled with fluid. There are abnormal gas collections around the bowel and there is a large collection of gas in the subcutaneous tissues. Findings very worrisome  for necrosis of the distal 10 cm of colon. The remainder the right colon is unremarkable. No intra-abdominal abscess is identified. Vascular/Lymphatic: Stable vascular calcifications. No abdominal or pelvic lymphadenopathy. Reproductive: Surgically absent. Other: None Musculoskeletal: No acute bony findings. Stable T12 compression fracture with vertebral plana. IMPRESSION: 1. Surgical changes from a left colectomy and left lower colostomy. The distal colostomy is abnormal. Beginning approximately 10 cm from the ostomy site the colon is very amorphous and is filled with fluid. There are abnormal gas collections around the bowel and there is a large collection of gas in the subcutaneous tissues. Findings very worrisome for necrosis of the distal 10 cm of colon. 2. No findings for abdominal/pelvic metastatic disease. 3. Stable large hiatal hernia. These results will be called to the ordering clinician or representative by the Radiologist Assistant, and communication documented in the PACS or [employer]. Electronically Signed  By: Marrian Siva M.D.   On: 11/11/2023 10:52    Labs:  CBC: Recent Labs    11/30/23 0435 12/03/23 0455 12/05/23 0800 12/08/23 0629  WBC 16.4* 11.9* 14.1* 15.9*  HGB 7.5* 8.1* 7.8* 8.7*  HCT 23.9* 26.0* 25.5* 28.0*  PLT 945* 815* 719* 646*    COAGS: Recent Labs    11/30/23 0435  INR 1.3*    BMP: Recent Labs    12/03/23 0455 12/05/23 0800 12/07/23 0420 12/09/23 0655  NA 136 133* 133* 135  K 4.5 4.5 4.5 4.4  CL 101 103 100 101  CO2 26 24 25 25   GLUCOSE 148* 154* 143* 152*  BUN 20 25* 23 20  CALCIUM 8.5* 8.3* 8.3* 8.4*  CREATININE 0.51 0.59 0.43* 0.54  GFRNONAA >60 >60 >60 >60    LIVER FUNCTION TESTS: Recent Labs    11/26/23 0453 11/30/23 0435 12/03/23 0455 12/07/23 0420  BILITOT 0.5 0.5 0.4 0.5  AST 15 14* 13* 15  ALT 9 9 11 11   ALKPHOS 159* 197* 189* 166*  PROT 6.2* 7.0 7.1 6.9  ALBUMIN  1.8* 2.0* 2.0* 1.8*    TUMOR MARKERS: No  results for input(s): "AFPTM", "CEA", "CA199", "CHROMGRNA" in the last 8760 hours.  Assessment and Plan:  Intra-abdominal abscesses s/p colostomy reversal: DELROSE ROHWER is a 88 y.o. female with a history of colon cancer s/p resection with ostomy formation who underwent colostomy reversal secondary to ostomy site necrosis. She later developed intra-abdominal abscesses for which IR placed 2 percutaneous drains. These were subsequently removed on 5/16 after several days of no output. IR consulted by surgery due to concern for anterior abdominal fluid collection on recent CT.   -CT A/P on 5/20 with anterior abdominal fluid collection -NPO at [facility]; [facility]PN feeds [facility] -Labs for the morning -Lovenox  given today; to be held on 5/21 -Plan for anterior abdominal drain placement on 5/22 under moderate sedation  Risks and benefits discussed with the patient including bleeding, infection, damage to adjacent structures, bowel perforation/fistula connection, and sepsis.  All of the patient's questions were answered, patient is agreeable to proceed. Consent signed and in chart.  Thank you for allowing our service to participate in RENITA BROCKS 's care.    Electronically Signed: Orson Blalock, PA-C   12/09/2023, 3:57 PM     I spent a total of 20 Minutesin face to face in clinical consultation, greater than 50% of which was counseling/coordinating care for percutaneous drain placement.

## 2023-12-09 NOTE — Progress Notes (Signed)
 Mobility Specialist - Progress Note   12/09/23 1634  Mobility  Activity Ambulated with assistance in hallway  Level of Assistance Standby assist, set-up cues, supervision of patient - no hands on  Assistive Device Front wheel walker  Distance Ambulated (ft) 160 ft  Activity Response Tolerated well  Mobility visit 1 Mobility  Mobility Specialist Start Time (ACUTE ONLY) 1546  Mobility Specialist Stop Time (ACUTE ONLY) 1601  Mobility Specialist Time Calculation (min) (ACUTE ONLY) 15 min   Pt sitting in the recliner upon entry, utilizing RA. Pt agreeable to activity this date, denied pain. Pt STS to RW with supervision, amb one lap around the NS MinG-supervision--- steady pace, no LOB. Pt expressed 2/10 RPE with min SOB during amb-- O2 >90%. Pt returned to the room, left seated in the recliner with needs within reach. MD present at bedside.  Versa Gore Mobility Specialist 12/09/23 4:45 PM

## 2023-12-09 NOTE — Progress Notes (Signed)
 Physical Therapy Treatment Patient Details Name: Kristin Miller MRN: [id number] DOB: July 12, 1936 Today's Date: 12/09/2023   History of Present Illness JAYEL SCADUTO presented to [facility] on 4/25 for planned surgery, with the diagnosis of colostomy complication; now s/p revision. Other dx include back pain, glaucoma, HTN, COPD.    PT Comments  Pt was pleasant and motivated to participate during the session and put forth good effort throughout. Pt required cuing for hand placement with transfers but no physical assist and was steady with good control throughout.  Pt able to ascend and descend 4 steps x 2 per below with good eccentric and concentric control and stability and with no adverse symptoms.  Pt making good progress towards goals and will benefit from continued PT services upon discharge to safely address deficits listed in patient problem list for decreased caregiver assistance and eventual return to PLOF.      If plan is discharge home, recommend the following: Help with stairs or ramp for entrance;Assist for transportation;Assistance with cooking/housework;A little help with walking and/or transfers;A little help with bathing/dressing/bathroom   Can travel by private vehicle     Yes  Equipment Recommendations  None recommended by PT    Recommendations for Other Services       Precautions / Restrictions Precautions Precautions: Fall Restrictions Weight Bearing Restrictions Per Provider Order: No Other Position/Activity Restrictions: One drain/bulb on both left and right sides plus ostomy     Mobility  Bed Mobility               General bed mobility comments: NT, pt in recliner pre-post session    Transfers Overall transfer level: Needs assistance Equipment used: Rolling walker (2 wheels) Transfers: Sit to/from Stand Sit to Stand: Supervision           General transfer comment: verb and tactile cues for hand placement    Ambulation/Gait                    Stairs Stairs: Yes Stairs assistance: Contact guard assist Stair Management: One rail Left, Two rails, Forwards, Step to pattern Number of Stairs: 4 General stair comments: Pt able to ascend/descend 4 steps x 1 with BUEs on L rail and x 1 with B rails   Wheelchair Mobility     Tilt Bed    Modified Rankin (Stroke Patients Only)       Balance Overall balance assessment: Needs assistance Sitting-balance support: Feet supported Sitting balance-Leahy Scale: Good     Standing balance support: Bilateral upper extremity supported, Reliant on assistive device for balance, During functional activity Standing balance-Leahy Scale: Good                              Communication Communication Communication: No apparent difficulties  Cognition Arousal: Alert Behavior During Therapy: WFL for tasks assessed/performed   PT - Cognitive impairments: No apparent impairments                         Following commands: Intact      Cueing Cueing Techniques: Verbal cues, Tactile cues  Exercises      General Comments        Pertinent Vitals/Pain Pain Assessment Pain Assessment: No/denies pain    Home Living                          Prior Function  PT Goals (current goals can now be found in the care plan section) Progress towards PT goals: Progressing toward goals    Frequency    Min 2X/week      PT Plan      Co-evaluation              AM-PAC PT "6 Clicks" Mobility   Outcome Measure  Help needed turning from your back to your side while in a flat bed without using bedrails?: A Little Help needed moving from lying on your back to sitting on the side of a flat bed without using bedrails?: A Little Help needed moving to and from a bed to a chair (including a wheelchair)?: A Little Help needed standing up from a chair using your arms (e.g., wheelchair or bedside chair)?: A Little Help needed to walk in  hospital room?: A Little Help needed climbing 3-5 steps with a railing? : A Little 6 Click Score: 18    End of Session Equipment Utilized During Treatment: Gait belt;Other (comment) (placed high to avoid drains/ostomy) Activity Tolerance: Patient tolerated treatment well Patient left: in chair;with call bell/phone within reach;with chair alarm set;with family/visitor present Nurse Communication: Mobility status PT Visit Diagnosis: Muscle weakness (generalized) (M62.81);Difficulty in walking, not elsewhere classified (R26.2);Unsteadiness on feet (R26.81)     Time: 4098-1191 PT Time Calculation (min) (ACUTE ONLY): 43 min  Charges:    $Gait Training: 8-22 mins $Therapeutic Activity: 8-22 mins PT General Charges $$ ACUTE PT VISIT: 1 Visit                     D. Scott Sharnika Binney PT, DPT 12/09/23, 10:22 AM

## 2023-12-09 NOTE — Plan of Care (Signed)

## 2023-12-10 ENCOUNTER — Inpatient Hospital Stay

## 2023-12-10 ENCOUNTER — Inpatient Hospital Stay: Admitting: Oncology

## 2023-12-10 LAB — BASIC METABOLIC PANEL WITH GFR
Anion gap: 10 (ref 5–15)
BUN: 18 mg/dL (ref 8–23)
CO2: 24 mmol/L (ref 22–32)
Calcium: 8.3 mg/dL — ABNORMAL LOW (ref 8.9–10.3)
Chloride: 102 mmol/L (ref 98–111)
Creatinine, Ser: 0.6 mg/dL (ref 0.44–1.00)
GFR, Estimated: >60 mL/min (ref >60)
Glucose, Bld: 111 mg/dL — ABNORMAL HIGH (ref 70–99)
Potassium: 4.2 mmol/L (ref 3.5–5.1)
Sodium: 136 mmol/L (ref 135–145)

## 2023-12-10 LAB — GLUCOSE, CAPILLARY
Glucose-Capillary: 100 mg/dL — ABNORMAL HIGH (ref 70–99)
Glucose-Capillary: 108 mg/dL — ABNORMAL HIGH (ref 70–99)
Glucose-Capillary: 116 mg/dL — ABNORMAL HIGH (ref 70–99)
Glucose-Capillary: 129 mg/dL — ABNORMAL HIGH (ref 70–99)
Glucose-Capillary: 131 mg/dL — ABNORMAL HIGH (ref 70–99)

## 2023-12-10 LAB — CBC
HCT: 24.2 % — ABNORMAL LOW (ref 36.0–46.0)
Hemoglobin: 7.6 g/dL — ABNORMAL LOW (ref 12.0–15.0)
MCH: 27.5 pg (ref 26.0–34.0)
MCHC: 31.4 g/dL (ref 30.0–36.0)
MCV: 87.7 fL (ref 80.0–100.0)
Platelets: 469 10*3/uL — ABNORMAL HIGH (ref 150–400)
RBC: 2.76 MIL/uL — ABNORMAL LOW (ref 3.87–5.11)
RDW: 17.6 % — ABNORMAL HIGH (ref 11.5–15.5)
WBC: 11.6 10*3/uL — ABNORMAL HIGH (ref 4.0–10.5)
nRBC: 0 % (ref 0.0–0.2)

## 2023-12-10 LAB — PROTIME-INR
INR: 1.3 — ABNORMAL HIGH (ref 0.8–1.2)
Prothrombin Time: 15.9 s — ABNORMAL HIGH (ref 11.4–15.2)

## 2023-12-10 LAB — MAGNESIUM: Magnesium: 2.1 mg/dL (ref 1.7–2.4)

## 2023-12-10 MED ORDER — FENTANYL CITRATE (PF) 100 MCG/2ML IJ SOLN
INTRAMUSCULAR | Status: AC
  Filled 2023-12-10: qty 4

## 2023-12-10 MED ORDER — LIDOCAINE HCL (PF) 1 % IJ SOLN
10.0000 mL | Freq: Once | INTRAMUSCULAR | Status: AC
  Administered 2023-12-10: 10 mL via INTRADERMAL
  Filled 2023-12-10: qty 10

## 2023-12-10 MED ORDER — MIDAZOLAM HCL 2 MG/2ML IJ SOLN
INTRAMUSCULAR | Status: AC
Start: 2023-12-10 — End: ?
  Filled 2023-12-10: qty 4

## 2023-12-10 MED ORDER — SODIUM CHLORIDE 0.9% FLUSH
5.0000 mL | Freq: Three times a day (TID) | INTRAVENOUS | Status: DC
Start: 2023-12-10 — End: 2023-12-15
  Administered 2023-12-10 – 2023-12-15 (×14): 5 mL

## 2023-12-10 MED ORDER — SODIUM CHLORIDE 0.9 % IV SOLN: INTRAVENOUS | Status: AC

## 2023-12-10 MED ORDER — METRONIDAZOLE 500 MG PO TABS
500.0000 mg | ORAL_TABLET | Freq: Three times a day (TID) | ORAL | Status: DC
  Administered 2023-12-10 – 2023-12-15 (×15): 500 mg via ORAL
  Filled 2023-12-10 (×19): qty 1

## 2023-12-10 MED ORDER — FENTANYL CITRATE (PF) 100 MCG/2ML IJ SOLN
INTRAMUSCULAR | Status: AC | PRN
  Administered 2023-12-10: 25 ug via INTRAVENOUS
  Administered 2023-12-10: 50 ug via INTRAVENOUS

## 2023-12-10 MED ORDER — MIDAZOLAM HCL 2 MG/2ML IJ SOLN
INTRAMUSCULAR | Status: AC | PRN
  Administered 2023-12-10: 1 mg via INTRAVENOUS
  Administered 2023-12-10: .5 mg via INTRAVENOUS

## 2023-12-10 MED ORDER — CIPROFLOXACIN HCL 500 MG PO TABS
500.0000 mg | ORAL_TABLET | Freq: Two times a day (BID) | ORAL | Status: DC
  Administered 2023-12-10 – 2023-12-15 (×11): 500 mg via ORAL
  Filled 2023-12-10 (×11): qty 1

## 2023-12-10 NOTE — Progress Notes (Signed)
 [facility] SURGICAL PROGRESS NOTE  Hospital Day(s): 27.   Post op day(s): 27 Days Post-Op.   Interval History:  Patient seen and examined No acute events or new complaints overnight.  Patient reports she is doing well; still weak but progressing No significant abdominal pain No emesis, fever, chills Labs are reassuring; WBC improved 11K Drains as follows...             - LUQ: 0 ccs              - RUQ: 0 ccs She is on soft diet; no issue - NPO this morning  Colostomy; with stool and gas; unmeasured - this was very watery needing multiple changes overnight  She is ambulating/working with therapies - now to [facility]  Vital signs in last 24 hours: [min-max] current  Temp:  [97.7 F (36.5 C)-99 F (37.2 C)] 98.5 F (36.9 C) (05/22 0247) Pulse Rate:  [71-77] 71 (05/22 0247) Resp:  [16-17] 17 (05/22 0247) BP: (127-147)/(44-70) 127/44 (05/22 0247) SpO2:  [95 %-98 %] 95 % (05/22 0247)     Height: 5' (152.4 cm) Weight: 61.6 kg BMI (Calculated): 26.52   Intake/Output last 2 shifts:  05/21 0701 - 05/22 0700 In: 495 [P.O.:480; I.V.:15] Out: 75 [Stool:75]   Physical Exam:  Constitutional: alert, cooperative and no distress  Respiratory: breathing non-labored at rest  Cardiovascular: regular rate and sinus rhythm  Gastrointestinal: soft, non-tender, and non-distended. Colostomy in right abdomen; gas and dark liquid in bag, drains x2 present; output slowed/stopped  Labs:     Latest Ref Rng & Units 12/10/2023    1:21 AM 12/08/2023    6:29 AM 12/05/2023    8:00 AM  CBC  WBC 4.0 - 10.5 K/uL 11.6  15.9  14.1   Hemoglobin 12.0 - 15.0 g/dL 7.6  8.7  7.8   Hematocrit 36.0 - 46.0 % 24.2  28.0  25.5   Platelets 150 - 400 K/uL 469  646  719       Latest Ref Rng & Units 12/10/2023    6:40 AM 12/09/2023    6:55 AM 12/07/2023    4:20 AM  CMP  Glucose 70 - 99 mg/dL 295  621  308   BUN 8 - 23 mg/dL 18  20  23    Creatinine 0.44 - 1.00 mg/dL 6.57  8.46  9.62   Sodium 135 - 145  mmol/L 136  135  133   Potassium 3.5 - 5.1 mmol/L 4.2  4.4  4.5   Chloride 98 - 111 mmol/L 102  101  100   CO2 22 - 32 mmol/L 24  25  25    Calcium 8.9 - 10.3 mg/dL 8.3  8.4  8.3   Total Protein 6.5 - 8.1 g/dL   6.9   Total Bilirubin 0.0 - 1.2 mg/dL   0.5   Alkaline Phos 38 - 126 U/L   166   AST 15 - 41 U/L   15   ALT 0 - 44 U/L   11      Imaging studies:  No new imaging studies    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, 26 Days Post-Op s/p revision of end colostomy, partial colectomy, and repair of parastomal hernia for necrosis of previous end colostomy    - Plan for aspiration +/- drain placement for RUQ fluid collection with IR; appreciate assistance    - NPO for planned IR procedure; Okay to resume afterwards   -  Will remove staples prior to DC - Continue drains; monitor and record output strictly - Continue octreotide , loperamide  to decrease bowel secretions    - Monitor colostomy output; WOC on board - appreciate help   - Pain control prn; antiemetics prn   - Mobilize as tolerated; PT/OT on board    - Appreciate medicine assistance as well    - Discharge Planning: Progressed to home health, will order this AM. Hopefully DC in next 24-48 hours   All of the above findings and recommendations were discussed with the patient, patient's family (husband at bedside), and the medical team, and all of patient's and family's questions were answered to their expressed satisfaction.  -- Apolonio Bay, PA-C  [facility] 12/10/2023, 7:14 AM M-F: 7am - 4pm

## 2023-12-10 NOTE — Procedures (Signed)
 Interventional Radiology Procedure Note  Procedure: Image guided drain placement, LUQ.  12F pigtail drain.  Complications: None  EBL: None Sample: Culture sent, frankly purulent material, ~5-10cc  Recommendations: - Routine drain care, with sterile flushes, record output - follow up Cx - routine wound care - ok to advance diet per primary order  Signed,  Marciano Settles. Mabel Savage, DO

## 2023-12-10 NOTE — Consult Note (Addendum)
 WOC Nurse ostomy follow up Secure chat message sent by bedside nurse that pouch has leaked 3 times last night and today.  Pt states she has watery stools with large amt output since drinking contrast yesterday for a procedure.  Stoma is red and viable, 1 1/2 inches, above skin level.  Applied one piece convex pouch and barrier ring to attempt to maintain a seal until output thickens.  Staples are loose and underneath the pouching system and have previously had stool leaking over the location. They have been in place 20 days, according to the Pt, and I removed them prior to pouch application. Incision line without open wound or drainage.  Applied foam dressing. 50cc watery brown liquid emptied from the pouch. 3 sets of supplies left at the bedside for staff nurse's use. Pt was independent with pouch emptying and application prior to admission and was using barrier rings and 2 piece pouching system.  Demonstrated and discussed possible use of a belt when out of bed at home and she states she has one at home.  Orders provided for bedside nurses as follows: EMPTY OSTOMY POUCH Q 1 HOUR to avoid overfilling/leaking Foam dressing to left abd, change Q 3 days or PRN soiling. Thank-you,  Wiliam Harder MSN, RN, CWOCN, [place], CNS [phone]

## 2023-12-10 NOTE — Plan of Care (Signed)

## 2023-12-10 NOTE — TOC Progression Note (Signed)
 Transition of Care [facility]) - Progression Note    Patient Details  Name: Kristin Miller MRN: [id number] Date of Birth: Dec 13, 1935  Transition of Care [facility]) CM/SW Contact  Loman Risk, RN Phone Number: 12/10/2023, 9:36 AM  Clinical Narrative:     Therapy has upgraded recs to home health.  Met with patient and spouse at bedside.  They are in agreement to home health.  Patient states that she does not have a preference of home health agency. Referral made to [facility] with [facility] has she has had them in the past.  Patient requesting rollator at discharge, PT in agreement        Expected Discharge Plan and Services                                               Social Determinants of Health (SDOH) Interventions SDOH Screenings   Food Insecurity: No Food Insecurity (11/14/2023)  Housing: Low Risk  (11/24/2023)  Transportation Needs: No Transportation Needs (11/14/2023)  Utilities: Not At Risk (11/14/2023)  Alcohol Screen: Low Risk  (09/25/2021)  Depression (PHQ2-9): Low Risk  (08/25/2023)  Financial Resource Strain: Low Risk  (01/09/2023)   Received from [facility], [facility]  Physical Activity: Inactive (09/25/2021)  Social Connections: Moderately Integrated (11/14/2023)  Stress: No Stress Concern Present (09/25/2021)  Tobacco Use: Low Risk  (11/13/2023)    Readmission Risk Interventions    11/20/2023    8:57 AM  Readmission Risk Prevention Plan  Transportation Screening Complete  PCP or Specialist Appt within 5-7 Days Complete  Medication Review (RN CM) Complete

## 2023-12-10 NOTE — TOC Progression Note (Signed)
 Transition of Care [facility]) - Progression Note    Patient Details  Name: Kristin Miller MRN: [id number] Date of Birth: 06/15/1936  Transition of Care [facility]) CM/SW Contact  Loman Risk, RN Phone Number: 12/10/2023, 9:42 AM  Clinical Narrative:     Per PA anticipated dc tomorrow. Referral made to Jon with [employer] for rollator         Expected Discharge Plan and Services                                               Social Determinants of Health (SDOH) Interventions SDOH Screenings   Food Insecurity: No Food Insecurity (11/14/2023)  Housing: Low Risk  (11/24/2023)  Transportation Needs: No Transportation Needs (11/14/2023)  Utilities: Not At Risk (11/14/2023)  Alcohol Screen: Low Risk  (09/25/2021)  Depression (PHQ2-9): Low Risk  (08/25/2023)  Financial Resource Strain: Low Risk  (01/09/2023)   Received from [name], [facility]  Physical Activity: Inactive (09/25/2021)  Social Connections: Moderately Integrated (11/14/2023)  Stress: No Stress Concern Present (09/25/2021)  Tobacco Use: Low Risk  (11/13/2023)    Readmission Risk Interventions    11/20/2023    8:57 AM  Readmission Risk Prevention Plan  Transportation Screening Complete  PCP or Specialist Appt within 5-7 Days Complete  Medication Review (RN CM) Complete

## 2023-12-10 NOTE — Progress Notes (Signed)
 Occupational Therapy Treatment Patient Details Name: Kristin Miller MRN: [id number] DOB: Oct 02, 1935 Today's Date: 12/10/2023   History of present illness Kristin Miller presented to [facility] on 4/25 for planned surgery, with the diagnosis of colostomy complication; now s/p revision. Other dx include back pain, glaucoma, HTN, COPD.   OT comments  Pt is supine in bed on arrival. Pleasant and agreeable to OT session. She denies pain. Discussion regarding home health therapy services and DME needs. Edu on shower chair and grab bar use at home with pt's visitors in room stating they can lend her theirs for as long as she needs and they will check on having someone install a grab bar for her. Pt performed bed mobility with supervision, as well as STS to RW. She ambulated to the bathroom and back using RW with SBA. Toilet transfer performed to standard toilet using grab bar with CGA, cueing for safety. Able to performed peri-care seated/standing with SBA and increased time. Ostomy site noted with possible leaking, nurse notified and in to assess needing pt back in bed to do so. Pt returned to supine with supervision with all needs in place and will cont to require skilled acute OT services to maximize her safety and IND to return to PLOF.       If plan is discharge home, recommend the following:  Assistance with cooking/housework;Assist for transportation;Help with stairs or ramp for entrance;A little help with walking and/or transfers;A little help with bathing/dressing/bathroom   Equipment Recommendations  Other (comment);BSC/3in1    Recommendations for Other Services      Precautions / Restrictions Precautions Precautions: Fall Restrictions Weight Bearing Restrictions Per Provider Order: No Other Position/Activity Restrictions: 2 drains on left, one on the right and ostomy       Mobility Bed Mobility Overal bed mobility: Needs Assistance Bed Mobility: Rolling, Sidelying to Sit, Sit to  Sidelying Rolling: Supervision Sidelying to sit: Used rails, HOB elevated, Supervision     Sit to sidelying: Supervision, Used rails      Transfers Overall transfer level: Needs assistance Equipment used: Rolling walker (2 wheels) Transfers: Sit to/from Stand Sit to Stand: Supervision           General transfer comment: increased time/effort, but no physical assist     Balance Overall balance assessment: Needs assistance Sitting-balance support: Feet supported Sitting balance-Leahy Scale: Good     Standing balance support: Reliant on assistive device for balance, During functional activity, Single extremity supported Standing balance-Leahy Scale: Good Standing balance comment: able to perform standing peri-care with unilateral support                           ADL either performed or assessed with clinical judgement   ADL Overall ADL's : Needs assistance/impaired                         Toilet Transfer: Regular Toilet;Grab bars;Rolling walker (2 wheels);Contact guard assist   Toileting- Clothing Manipulation and Hygiene: Supervision/safety;Sitting/lateral lean;Sit to/from stand;Contact guard assist         General ADL Comments: pt amb to bathroom to urinate and performed LB bathing partially/peri-care d/t ostomy site drainage    Extremity/Trunk Assessment              Vision       Perception     Praxis     Communication Communication Communication: No apparent difficulties   Cognition Arousal: Alert  Behavior During Therapy: [facility] for tasks assessed/performed                                 Following commands: Intact        Cueing   Cueing Techniques: Verbal cues, Tactile cues  Exercises Other Exercises Other Exercises: Discussed placement of shower chair and grab bars at home for increased safety and to prevent overexertion at home.    Shoulder Instructions       General Comments      Pertinent  Vitals/ Pain       Pain Assessment Pain Assessment: No/denies pain  Home Living                                          Prior Functioning/Environment              Frequency  Min 2X/week        Progress Toward Goals  OT Goals(current goals can now be found in the care plan section)  Progress towards OT goals: Progressing toward goals  Acute Rehab OT Goals Patient Stated Goal: return home OT Goal Formulation: With patient Time For Goal Achievement: 12/21/23 Potential to Achieve Goals: Good  Plan      Co-evaluation                 AM-PAC OT "6 Clicks" Daily Activity     Outcome Measure   Help from another person eating meals?: None Help from another person taking care of personal grooming?: None Help from another person toileting, which includes using toliet, bedpan, or urinal?: A Little Help from another person bathing (including washing, rinsing, drying)?: A Little Help from another person to put on and taking off regular upper body clothing?: A Little Help from another person to put on and taking off regular lower body clothing?: A Lot 6 Click Score: 19    End of Session Equipment Utilized During Treatment: Gait belt;Rolling walker (2 wheels)  OT Visit Diagnosis: Muscle weakness (generalized) (M62.81)   Activity Tolerance Patient tolerated treatment well   Patient Left with call bell/phone within reach;in bed;with bed alarm set;with nursing/sitter in room   Nurse Communication Mobility status        Time: 1610-9604 OT Time Calculation (min): 33 min  Charges: OT General Charges $OT Visit: 1 Visit OT Treatments $Self Care/Home Management : 23-37 mins  Anelle Parlow, OTR/L  12/10/23, 3:42 PM   Leonard Raker 12/10/2023, 3:39 PM

## 2023-12-11 ENCOUNTER — Other Ambulatory Visit: Payer: Self-pay

## 2023-12-11 ENCOUNTER — Encounter: Payer: Self-pay | Admitting: Oncology

## 2023-12-11 LAB — GLUCOSE, CAPILLARY
Glucose-Capillary: 104 mg/dL — ABNORMAL HIGH (ref 70–99)
Glucose-Capillary: 107 mg/dL — ABNORMAL HIGH (ref 70–99)
Glucose-Capillary: 107 mg/dL — ABNORMAL HIGH (ref 70–99)

## 2023-12-11 MED ORDER — CIPROFLOXACIN HCL 500 MG PO TABS: 500.0000 mg | ORAL_TABLET | Freq: Two times a day (BID) | ORAL | 0 refills | Status: AC

## 2023-12-11 MED ORDER — OXYCODONE HCL 5 MG PO TABS
5.0000 mg | ORAL_TABLET | ORAL | 0 refills | Status: DC | PRN
  Filled 2023-12-11: qty 20, 4d supply, fill #0

## 2023-12-11 MED ORDER — OXYCODONE HCL 5 MG PO TABS: 5.0000 mg | ORAL_TABLET | ORAL | 0 refills | Status: DC | PRN

## 2023-12-11 MED ORDER — LOPERAMIDE HCL 2 MG PO CAPS: 2.0000 mg | ORAL_CAPSULE | ORAL | 1 refills | Status: DC | PRN

## 2023-12-11 MED ORDER — METRONIDAZOLE 500 MG PO TABS: 500.0000 mg | ORAL_TABLET | Freq: Three times a day (TID) | ORAL | 0 refills | Status: AC

## 2023-12-11 MED ORDER — DIPHENOXYLATE-ATROPINE 2.5-0.025 MG PO TABS
2.0000 | ORAL_TABLET | Freq: Four times a day (QID) | ORAL | 0 refills | Status: DC
  Filled 2023-12-11: qty 30, 4d supply, fill #0

## 2023-12-11 NOTE — TOC Progression Note (Signed)
 Transition of Care [facility]) - Progression Note    Patient Details  Name: Kristin Miller MRN: [id number] Date of Birth: [birth year]  Transition of Care [facility]) CM/SW Contact  Loman Risk, RN Phone Number: 12/11/2023, 1:54 PM  Clinical Narrative:     Discharge cancelled due to opening on surgical incision [name]  with [facility] notified         Expected Discharge Plan and Services         Expected Discharge Date: 12/11/23                                     Social Determinants of Health (SDOH) Interventions SDOH Screenings   Food Insecurity: No Food Insecurity (11/14/2023)  Housing: Low Risk  (11/24/2023)  Transportation Needs: No Transportation Needs (11/14/2023)  Utilities: Not At Risk (11/14/2023)  Alcohol Screen: Low Risk  (09/25/2021)  Depression (PHQ2-9): Low Risk  (08/25/2023)  Financial Resource Strain: Low Risk  (01/09/2023)   Received from [facility], [facility]  Physical Activity: Inactive (09/25/2021)  Social Connections: Moderately Integrated (11/14/2023)  Stress: No Stress Concern Present (09/25/2021)  Tobacco Use: Low Risk  (11/13/2023)    Readmission Risk Interventions    11/20/2023    8:57 AM  Readmission Risk Prevention Plan  Transportation Screening Complete  PCP or Specialist Appt within 5-7 Days Complete  Medication Review (RN CM) Complete

## 2023-12-11 NOTE — TOC Transition Note (Signed)
 Transition of Care [facility]) - Discharge Note   Patient Details  Name: Kristin Miller MRN: [id number] Date of Birth: October 29, 1935  Transition of Care [facility]) CM/SW Contact:  Loman Risk, RN Phone Number: 12/11/2023, 10:30 AM   Clinical Narrative:     Patient to discharge today Rollator was delivered to room yesterday [name]  with [facility] notified of discharge         Patient Goals and CMS Choice            Discharge Placement                       Discharge Plan and Services Additional resources added to the After Visit Summary for                                       Social Drivers of Health (SDOH) Interventions SDOH Screenings   Food Insecurity: No Food Insecurity (11/14/2023)  Housing: Low Risk  (11/24/2023)  Transportation Needs: No Transportation Needs (11/14/2023)  Utilities: Not At Risk (11/14/2023)  Alcohol Screen: Low Risk  (09/25/2021)  Depression (PHQ2-9): Low Risk  (08/25/2023)  Financial Resource Strain: Low Risk  (01/09/2023)   Received from [facility], [facility]  Physical Activity: Inactive (09/25/2021)  Social Connections: Moderately Integrated (11/14/2023)  Stress: No Stress Concern Present (09/25/2021)  Tobacco Use: Low Risk  (11/13/2023)     Readmission Risk Interventions    11/20/2023    8:57 AM  Readmission Risk Prevention Plan  Transportation Screening Complete  PCP or Specialist Appt within 5-7 Days Complete  Medication Review (RN CM) Complete

## 2023-12-11 NOTE — Discharge Instructions (Addendum)
 [facility].  [address] , [place], [place], [address]. [phone] They will call you to arrange when they can come to see you    In addition to included general post-operative instructions,  Diet: Resume home diet.   Activity: No heavy lifting >20 pounds (children, pets, laundry, garbage) or strenuous activity until cleared by surgery, but light activity and walking are encouraged. Do not drive or drink alcohol if taking narcotic pain medications or having pain that might distract from driving.  Wound care:  -- Cover previous colostomy site with dry gauze and secure with tape. Change this as needed. You may need to frequently change this if significant drainage. Okay to remove this to shower -- Cover drains with dry gauze and tape; change as needed   Colostomy:  --Monitor colostomy output daily. Aim for paste/thicker consistency. If this is quite watery, may need to increase imodium /lomotil   Drains:  -- Monitor and record drain output daily; handout given for these. Please bring these with you to clinic   Medications: Resume all home medications except . For mild to moderate pain: acetaminophen  (Tylenol ) or ibuprofen /naproxen (if no kidney disease). Combining Tylenol  with alcohol can substantially increase your risk of causing liver disease. Narcotic pain medications, if prescribed, can be used for severe pain, though may cause nausea, constipation, and drowsiness. Do not combine Tylenol  and Percocet (or similar) within a 6 hour period as Percocet (and similar) contain(s) Tylenol . If you do not need the narcotic pain medication, you do not need to fill the prescription.  Call office ([phone] / [phone]) at any time if any questions, worsening pain, fevers/chills, bleeding, drainage from incision site, or other concerns.

## 2023-12-11 NOTE — Progress Notes (Addendum)
 Physical Therapy Treatment Patient Details Name: Kristin Miller MRN: [id number] DOB: 03-06-1936 Today's Date: 12/11/2023   History of Present Illness Kristin Miller presented to [facility] on 4/25 for planned surgery, with the diagnosis of colostomy complication; now s/p revision. Other dx include back pain, glaucoma, HTN, COPD.    PT Comments  Pt was pleasant and motivated to participate during the session and put forth good effort throughout. Pt required no physical assistance during the session but did require occasional cuing for sequencing and extra time and effort.  Pt was able to stand and take several small steps with additional amb deferred secondary to leaking ostomy, nursing in room and aware.  Pt and spouse present for verbal and visual/demonstration training of proper use of a rollator per below with all questions addressed.  Pt will benefit from continued PT services upon discharge to safely address deficits listed in patient problem list for decreased caregiver assistance and eventual return to PLOF.      If plan is discharge home, recommend the following: Help with stairs or ramp for entrance;Assist for transportation;Assistance with cooking/housework;A little help with walking and/or transfers;A little help with bathing/dressing/bathroom   Can travel by private vehicle     Yes  Equipment Recommendations  Rollator (4 wheels)    Recommendations for Other Services       Precautions / Restrictions Precautions Precautions: Fall Recall of Precautions/Restrictions: Intact Restrictions Weight Bearing Restrictions Per Provider Order: No Other Position/Activity Restrictions: 3 drains and an ostomy     Mobility  Bed Mobility Overal bed mobility: Needs Assistance Bed Mobility: Rolling, Sidelying to Sit, Sit to Sidelying Rolling: Supervision, Used rails Sidelying to sit: Supervision, HOB elevated, Used rails     Sit to sidelying: Supervision, Used rails General bed mobility  comments: Assistance provided to manage drains but no physical assist needed during sidelying to/from sit    Transfers Overall transfer level: Needs assistance Equipment used: Rolling walker (2 wheels) Transfers: Sit to/from Stand Sit to Stand: Supervision           General transfer comment: Min increased effort but no physical assist, good eccentric control during stand to sit    Ambulation/Gait Ambulation/Gait assistance: Supervision Gait Distance (Feet): 3 Feet Assistive device: Rolling walker (2 wheels) Gait Pattern/deviations: Step-through pattern, Decreased step length - right, Decreased step length - left Gait velocity: decreased     General Gait Details: Pt steady taking several small steps near the EOB with further amb deferred secondary to leaking ostomy, nsg in room and aware   Stairs             Wheelchair Mobility     Tilt Bed    Modified Rankin (Stroke Patients Only)       Balance Overall balance assessment: Needs assistance Sitting-balance support: Feet supported Sitting balance-Leahy Scale: Good     Standing balance support: Reliant on assistive device for balance, During functional activity, Bilateral upper extremity supported Standing balance-Leahy Scale: Good                              Communication Communication Communication: No apparent difficulties  Cognition Arousal: Alert Behavior During Therapy: WFL for tasks assessed/performed   PT - Cognitive impairments: No apparent impairments                         Following commands: Intact      Cueing Cueing Techniques:  Verbal cues, Visual cues  Exercises Other Exercises Other Exercises: Visual education/demonstration with pt and spouse on proper use of a rollator during ambulation, during sit to/from stand from secondary surface with rollator in front, and during sit to/from stand to the rollator seat    General Comments        Pertinent Vitals/Pain  Pain Assessment Pain Assessment: No/denies pain    Home Living                          Prior Function            PT Goals (current goals can now be found in the care plan section) Progress towards PT goals: Progressing toward goals    Frequency    Min 2X/week      PT Plan      Co-evaluation              AM-PAC PT "6 Clicks" Mobility   Outcome Measure  Help needed turning from your back to your side while in a flat bed without using bedrails?: A Little Help needed moving from lying on your back to sitting on the side of a flat bed without using bedrails?: A Little Help needed moving to and from a bed to a chair (including a wheelchair)?: A Little Help needed standing up from a chair using your arms (e.g., wheelchair or bedside chair)?: A Little Help needed to walk in hospital room?: A Little Help needed climbing 3-5 steps with a railing? : A Little 6 Click Score: 18    End of Session   Activity Tolerance: Other (comment) (limited by leaking ostomy) Patient left: in bed;with call bell/phone within reach;with bed alarm set;with family/visitor present Nurse Communication: Mobility status;Other (comment) (nsg aware of leaking ostomy) PT Visit Diagnosis: Muscle weakness (generalized) (M62.81);Difficulty in walking, not elsewhere classified (R26.2);Unsteadiness on feet (R26.81)     Time: 9604-5409 (addendum) PT Time Calculation (min) (ACUTE ONLY): 27 min  Charges:    $Therapeutic Activity: 23-37 mins PT General Charges $$ ACUTE PT VISIT: 1 Visit                     D. Scott Byanka Landrus PT, DPT 12/11/23, 10:45 AM

## 2023-12-11 NOTE — Consult Note (Addendum)
 WOC Nurse ostomy follow up Requested to pouch new fistula area. Pinhole opening to middle abd with mod amt green drainage and air bubbles visible in close proximity to colostomy stoma.  I will attempt to contain with a urostomy pouch, but if this is not successful, then will need to move up to a small Eakin pouch which would cover both the ostomy stoma and the fistula site.  Discussed plan of care with Pt and husband at the bedside.  Applied 2 piece 2 1/4 inch barrier and urostomy pouch over the midline draining site, then applied barrier ring and 2 piece 2 1/4 inch barrier and ostomy pouch over the colostomy stoma.  Several extra sets of each supply and patterns left in the room for bedside nurse's use. Demonstrated procedure to patient and her husband at the bedside.  If needed over the weekend to change, then I have ordered 4 small Eakin pouches for surgical team and bedside nurses' use: Timm Foot # [phone] to have at the bedside.  Thank-you,  Wiliam Harder MSN, RN, CWOCN, [place], CNS [phone]

## 2023-12-11 NOTE — Discharge Summary (Addendum)
 [facility] SURGICAL DISCHARGE SUMMARY  Patient ID: BRENNAN KARAM MRN: [id number] DOB/AGE: 1935/12/08 88 y.o.  Admit date: 11/13/2023 Discharge date: 12/11/2023  Discharge Diagnoses Colostomy Revision Intra-Abdominal Abscess Entero-abdominal Fistula   Consultants Interventional Radiology Medicine   Procedures 11/13/2023:  Revision of end colostomy requiring partial colectomy and resiting of the end colostomy to the right of the abdominal wall Repair of parastomal hernia   HPI: Kristin Miller is a 88 y.o. with history of colostomy with recurrent CA s/p revision who presents to [facility] on 04/25 for colostomy revision secondary to necrosis  Hospital Course: Informed consent was obtained and documented, and patient underwent uneventful colostomy revision and parastomal hernia repair (Dr Dana Duncan, 11/13/2023).  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 This was controlled with surgical drains. She did require additional drain placement on 05/06 (since removed) and 05/22 with interventional radiology. Ultimately, he drain output slowed significantly. She did require TPN but was weaned from this. Diet was advanced without issue. She progressed to [facility] with therapies. The remainder of patient's hospital course was essentially unremarkable, and discharge planning was initiated accordingly with patient safely able to be discharged home with appropriate discharge instructions, antibiotics (Cipro/Flagyl  x13 days to complete 14 total), pain control, and outpatient follow-up after all of her and her family's questions were answered to their expressed satisfaction.   Discharge Condition: Good   Physical Examination:  Constitutional: alert, cooperative and no distress  Respiratory: breathing non-labored at rest  Cardiovascular: regular rate and sinus rhythm  Gastrointestinal: soft, non-tender, and non-distended. Colostomy in right  abdomen; gas and dark liquid in bag, drains x2 present; output slowed/stopped. Newly placed IR drain in LUQ Integumentary: Incisions is healing well, staples removed, there is a small, pinpoint, hole centrally which is draining, no erythema    Allergies as of 12/11/2023       Reactions   Combigan  [brimonidine  Tartrate-timolol ]    Redness, burning, irritation   Lisinopril  Cough   Other Itching, Rash   SUTURES-(PARTIAL HYSTERECTOMY)        Medication List     STOP taking these medications    amoxicillin -clavulanate 875-125 MG tablet Commonly known as: AUGMENTIN    neomycin  500 MG tablet Commonly known as: MYCIFRADIN        TAKE these medications    acetaminophen  500 MG tablet Commonly known as: TYLENOL  Take 500 mg by mouth every 6 (six) hours as needed for pain.   alendronate  70 MG tablet Commonly known as: FOSAMAX  Take 1 tablet (70 mg total) by mouth every 7 (seven) days for 48 doses. Take with a full glass of water  on an empty stomach.   amLODipine  5 MG tablet Commonly known as: NORVASC  Take 1 tablet (5 mg total) by mouth daily.   CAL MAG ZINC +D3 PO Take 1 tablet by mouth daily.   ciprofloxacin 500 MG tablet Commonly known as: CIPRO Take 1 tablet (500 mg total) by mouth 2 (two) times daily for 13 days.   diphenoxylate -atropine  2.5-0.025 MG tablet Commonly known as: LOMOTIL  Take 2 tablets by mouth 4 (four) times daily.   docusate sodium 100 MG capsule Commonly known as: COLACE Take 100 mg by mouth daily as needed for mild constipation.   latanoprost  0.005 % ophthalmic solution Commonly known as: XALATAN  1 drop at bedtime. Eye doctor both eyes   loperamide  2 MG capsule Commonly known as: IMODIUM  Take 1 capsule (2 mg total) by mouth as needed for diarrhea or loose stools. May take 1-2 capsules every 8  hours   losartan -hydrochlorothiazide  100-12.5 MG tablet Commonly known as: HYZAAR Take 1 tablet by mouth daily.   metroNIDAZOLE  500 MG tablet Commonly  known as: FLAGYL  Take 1 tablet (500 mg total) by mouth 3 (three) times daily for 13 days. What changed:  how much to take how to take this when to take this additional instructions   oxyCODONE  5 MG immediate release tablet Commonly known as: Oxy IR/ROXICODONE  Take 1 tablet (5 mg total) by mouth every 4 (four) hours as needed for severe pain (pain score 7-10).   potassium chloride  SA 20 MEQ tablet Commonly known as: KLOR-CON  M Take 2 tablets (40 mEq total) by mouth daily.               Durable Medical Equipment  (From admission, onward)           Start     Ordered   12/10/23 0941  For home use only DME 4 wheeled rolling walker with seat  Once       Question:  Patient needs a walker to treat with the following condition  Answer:  Weakness   12/10/23 0940              Follow-up Information     Alben Alma, MD. Go on 12/16/2023.   Specialty: General Surgery Why: Go to appointment on 05/28 at 345 PM Contact information: [address] [place] [address] [phone]                  Time spent on discharge management including discussion of hospital course, clinical condition, outpatient instructions, prescriptions, and follow up with the patient and members of the medical team: >30 minutes  -- Apolonio Bay , PA-C [facility]  12/11/2023, 10:40 AM [phone] M-F: 7am - 4pm

## 2023-12-11 NOTE — Plan of Care (Signed)

## 2023-12-12 LAB — GLUCOSE, CAPILLARY
Glucose-Capillary: 103 mg/dL — ABNORMAL HIGH (ref 70–99)
Glucose-Capillary: 68 mg/dL — ABNORMAL LOW (ref 70–99)
Glucose-Capillary: 95 mg/dL (ref 70–99)

## 2023-12-12 NOTE — Progress Notes (Addendum)
 Subjective:  CC: Kristin Miller is a 88 y.o. female  Hospital stay day 29, 29 Days Post-Op s/p revision of end colostomy, partial colectomy, and repair of parastomal hernia for necrosis of previous end colostomy   HPI: Increasing output from small opening adjacent to colostomy today.  Eakin's pouch was placed over the incision site.  Patient otherwise has some complaints at the new drain site in the left upper quadrant.  ROS:  Pertinent positives and negatives noted in the HPI.    Objective:   Temp:  [97.9 F (36.6 C)-98.3 F (36.8 C)] 98.2 F (36.8 C) (05/24 0740) Pulse Rate:  [62-76] 76 (05/24 0740) Resp:  [16-18] 16 (05/24 0740) BP: (130-142)/(53-67) 142/59 (05/24 0740) SpO2:  [96 %-97 %] 96 % (05/24 0740)     Height: 5' (152.4 cm) Weight: 60.9 kg BMI (Calculated): 26.22   Intake/Output this shift:   Intake/Output Summary (Last 24 hours) at 12/12/2023 1059 Last data filed at 12/12/2023 0455 Gross per 24 hour  Intake 240 ml  Output 10 ml  Net 230 ml    Constitutional :  alert, cooperative, appears stated age, and no distress  Respiratory:  Clear to auscultation bilaterally  Cardiovascular:  Regular rate and rhythm  Gastrointestinal: soft, non-tender; bowel sounds normal; no masses,  no organomegaly.  JP drains with scant output of old bile and purulent drainage.  Insertion sites intact.  Midline wound with Eakin pouch surrounding it with scant enteric output which is thin and lighter in color compared to the adjacent colostomy output.  Skin: Cool and moist  Psychiatric: Normal affect, non-agitated, not confused       LABS:     Latest Ref Rng & Units 12/10/2023    6:40 AM 12/09/2023    6:55 AM 12/07/2023    4:20 AM  CMP  Glucose 70 - 99 mg/dL 962  952  841   BUN 8 - 23 mg/dL 18  20  23    Creatinine 0.44 - 1.00 mg/dL 3.24  4.01  0.27   Sodium 135 - 145 mmol/L 136  135  133   Potassium 3.5 - 5.1 mmol/L 4.2  4.4  4.5   Chloride 98 - 111 mmol/L 102  101  100   CO2 22 -  32 mmol/L 24  25  25    Calcium 8.9 - 10.3 mg/dL 8.3  8.4  8.3   Total Protein 6.5 - 8.1 g/dL   6.9   Total Bilirubin 0.0 - 1.2 mg/dL   0.5   Alkaline Phos 38 - 126 U/L   166   AST 15 - 41 U/L   15   ALT 0 - 44 U/L   11       Latest Ref Rng & Units 12/10/2023    1:21 AM 12/08/2023    6:29 AM 12/05/2023    8:00 AM  CBC  WBC 4.0 - 10.5 K/uL 11.6  15.9  14.1   Hemoglobin 12.0 - 15.0 g/dL 7.6  8.7  7.8   Hematocrit 36.0 - 46.0 % 24.2  28.0  25.5   Platelets 150 - 400 K/uL 469  646  719     RADS: N/ Assessment:   S/p s/p revision of end colostomy, partial colectomy, and repair of parastomal hernia for necrosis of previous end colostomy  Clinically concerns of redeveloping EC fistula adjacent to the colostomy site.  Output quality distinctly different from the colostomy, so likely fistula is not from the colostomy but from a separate  loop of small bowel.  Difficult to fully assess the size of the fistula today, and with no accurate report of actual output from the bag, recommend we monitor for another 24 hours with an accurate measurement to determine low versus high output fistulous output, proceed with management accordingly.  Will continue IV antibiotics in the meantime to ensure she does not develop surrounding cellulitis or abdominal wall abscess    labs/images/medications/previous chart entries reviewed personally and relevant changes/updates noted above.

## 2023-12-12 NOTE — Plan of Care (Signed)

## 2023-12-13 LAB — GLUCOSE, CAPILLARY
Glucose-Capillary: 119 mg/dL — ABNORMAL HIGH (ref 70–99)
Glucose-Capillary: 123 mg/dL — ABNORMAL HIGH (ref 70–99)
Glucose-Capillary: 149 mg/dL — ABNORMAL HIGH (ref 70–99)

## 2023-12-13 NOTE — Progress Notes (Signed)
 Subjective:  CC: Kristin Miller is a 88 y.o. female  Hospital stay day 30, 30 Days Post-Op s/p revision of end colostomy, partial colectomy, and repair of parastomal hernia for necrosis of previous end colostomy   HPI: Minimal output from small opening adjacent to colostomy today per patient report.  Eakin's pouch remains intact.  ROS:  Pertinent positives and negatives noted in the HPI.    Objective:   Temp:  [97.9 F (36.6 C)-98.6 F (37 C)] 97.9 F (36.6 C) (05/25 0757) Pulse Rate:  [63-87] 65 (05/25 0757) Resp:  [18-20] 18 (05/25 0757) BP: (128-157)/(54-62) 145/54 (05/25 0757) SpO2:  [96 %-97 %] 97 % (05/25 0757)     Height: 5' (152.4 cm) Weight: 60.9 kg BMI (Calculated): 26.22   Intake/Output this shift:   Intake/Output Summary (Last 24 hours) at 12/13/2023 1222 Last data filed at 12/13/2023 0522 Gross per 24 hour  Intake --  Output 15 ml  Net -15 ml    Constitutional :  alert, cooperative, appears stated age, and no distress  Respiratory:  Clear to auscultation bilaterally  Cardiovascular:  Regular rate and rhythm  Gastrointestinal: soft, non-tender; bowel sounds normal; no masses,  no organomegaly.  JP drains with scant output of old bile and purulent drainage.  Insertion sites intact.  Former ostomy site wound with Eakin pouch surrounding it with scant enteric output which is thin and lighter in color compared to the adjacent colostomy output.  No major change compared to yesterday  Skin: Cool and moist  Psychiatric: Normal affect, non-agitated, not confused       LABS:     Latest Ref Rng & Units 12/10/2023    6:40 AM 12/09/2023    6:55 AM 12/07/2023    4:20 AM  CMP  Glucose 70 - 99 mg/dL 409  811  914   BUN 8 - 23 mg/dL 18  20  23    Creatinine 0.44 - 1.00 mg/dL 7.82  9.56  2.13   Sodium 135 - 145 mmol/L 136  135  133   Potassium 3.5 - 5.1 mmol/L 4.2  4.4  4.5   Chloride 98 - 111 mmol/L 102  101  100   CO2 22 - 32 mmol/L 24  25  25    Calcium 8.9 - 10.3  mg/dL 8.3  8.4  8.3   Total Protein 6.5 - 8.1 g/dL   6.9   Total Bilirubin 0.0 - 1.2 mg/dL   0.5   Alkaline Phos 38 - 126 U/L   166   AST 15 - 41 U/L   15   ALT 0 - 44 U/L   11       Latest Ref Rng & Units 12/10/2023    1:21 AM 12/08/2023    6:29 AM 12/05/2023    8:00 AM  CBC  WBC 4.0 - 10.5 K/uL 11.6  15.9  14.1   Hemoglobin 12.0 - 15.0 g/dL 7.6  8.7  7.8   Hematocrit 36.0 - 46.0 % 24.2  28.0  25.5   Platelets 150 - 400 K/uL 469  646  719     RADS: N/ Assessment:   S/p s/p revision of end colostomy, partial colectomy, and repair of parastomal hernia for necrosis of previous end colostomy  Clinically concerns of redeveloping EC fistula adjacent to the colostomy site.  Output quality distinctly different from the colostomy, so likely fistula is not from the colostomy but from a separate loop of small bowel.  No significant output reported.  Leukocytosis continues to decrease today as well.  Recommend continuing to monitor output and leukocytosis for another day.  She has been able to stay on a soft diet otherwise, still no concerns for developing infection or cellulitis at the former ostomy site and new ostomy continues to remain productive  Will continue IV antibiotics in the meantime to ensure she does not develop surrounding cellulitis or abdominal wall abscess    labs/images/medications/previous chart entries reviewed personally and relevant changes/updates noted above.

## 2023-12-14 LAB — BASIC METABOLIC PANEL WITH GFR
Anion gap: 6 (ref 5–15)
BUN: 13 mg/dL (ref 8–23)
CO2: 26 mmol/L (ref 22–32)
Calcium: 7.3 mg/dL — ABNORMAL LOW (ref 8.9–10.3)
Chloride: 106 mmol/L (ref 98–111)
Creatinine, Ser: 0.52 mg/dL (ref 0.44–1.00)
GFR, Estimated: >60 mL/min (ref >60)
Glucose, Bld: 109 mg/dL — ABNORMAL HIGH (ref 70–99)
Potassium: 3.5 mmol/L (ref 3.5–5.1)
Sodium: 138 mmol/L (ref 135–145)

## 2023-12-14 LAB — CBC
HCT: 29.5 % — ABNORMAL LOW (ref 36.0–46.0)
Hemoglobin: 9.1 g/dL — ABNORMAL LOW (ref 12.0–15.0)
MCH: 27.2 pg (ref 26.0–34.0)
MCHC: 30.8 g/dL (ref 30.0–36.0)
MCV: 88.3 fL (ref 80.0–100.0)
Platelets: 366 10*3/uL (ref 150–400)
RBC: 3.34 MIL/uL — ABNORMAL LOW (ref 3.87–5.11)
RDW: 18.5 % — ABNORMAL HIGH (ref 11.5–15.5)
WBC: 14.5 10*3/uL — ABNORMAL HIGH (ref 4.0–10.5)
nRBC: 0 % (ref 0.0–0.2)

## 2023-12-14 LAB — GLUCOSE, CAPILLARY
Glucose-Capillary: 125 mg/dL — ABNORMAL HIGH (ref 70–99)
Glucose-Capillary: 129 mg/dL — ABNORMAL HIGH (ref 70–99)
Glucose-Capillary: 107 mg/dL — ABNORMAL HIGH (ref 70–99)
Glucose-Capillary: 105 mg/dL — ABNORMAL HIGH (ref 70–99)

## 2023-12-14 MED ORDER — ADULT MULTIVITAMIN W/MINERALS CH
1.0000 | ORAL_TABLET | Freq: Every day | ORAL | Status: DC
  Administered 2023-12-14 – 2023-12-15 (×2): 1 via ORAL
  Filled 2023-12-14 (×2): qty 1

## 2023-12-14 NOTE — Progress Notes (Signed)
 CC: 31 Days Post-Op s/p revision of end colostomy, partial colectomy, and repair of parastomal hernia for necrosis of previous end colostomy  Subjective: Patient reports doing well this morning.  She does say that she had a small emesis after she took her Flagyl  and attributes the emesis to the bitter taste of the medicine.  90 recorded from her Eakin's pouch yesterday and unsure of how much came out overnight.  Continues to tolerate small amounts of soft diet.  Objective: Vital signs in last 24 hours: Temp:  [98.2 F (36.8 C)-98.6 F (37 C)] 98.6 F (37 C) (05/26 0828) Pulse Rate:  [65-86] 79 (05/26 0828) Resp:  [16-18] 18 (05/26 0828) BP: (127-153)/(52-86) 138/71 (05/26 0828) SpO2:  [97 %-98 %] 97 % (05/26 0828) Weight:  [58.7 kg] 58.7 kg (05/26 0500) Last BM Date : 12/13/23  Intake/Output from previous day: 05/25 0701 - 05/26 0700 In: 60 [P.O.:60] Out: 90 [Drains:90] Intake/Output this shift: Total I/O In: 60 [I.V.:60] Out: -   Physical exam: Abdomen soft, nontender.  JP drains do have some bilious output.  The IR drain appears to have purulent output from it.  Her ostomy is productive.  Lateral to the ostomy is site of her former ostomy site with some enteric drainage into an Eakin's pouch.  There is probably approximately 3 to 5 cc within the Eakin's pouch during my exam.   Lab Results: CBC  Recent Labs    12/14/23 0830  WBC 14.5*  HGB 9.1*  HCT 29.5*  PLT 366   BMET Recent Labs    12/14/23 0830  NA 138  K 3.5  CL 106  CO2 26  GLUCOSE 109*  BUN 13  CREATININE 0.52  CALCIUM 7.3*   PT/INR No results for input(s): "LABPROT", "INR" in the last 72 hours. ABG No results for input(s): "PHART", "HCO3" in the last 72 hours.  Invalid input(s): "PCO2", "PO2"  Studies/Results: No results found.  Anti-infectives: Anti-infectives (From admission, onward)    Start     Dose/Rate Route Frequency Ordered Stop   12/11/23 0000  ciprofloxacin (CIPRO) 500 MG tablet         500 mg Oral 2 times daily 12/11/23 1028 12/24/23 2359   12/11/23 0000  metroNIDAZOLE  (FLAGYL ) 500 MG tablet        500 mg Oral 3 times daily 12/11/23 1028 12/24/23 2359   12/10/23 1400  metroNIDAZOLE  (FLAGYL ) tablet 500 mg        500 mg Oral Every 8 hours 12/10/23 1043     12/10/23 1130  ciprofloxacin (CIPRO) tablet 500 mg        500 mg Oral 2 times daily 12/10/23 1043     11/18/23 1015  fluconazole  (DIFLUCAN ) IVPB 200 mg        200 mg 100 mL/hr over 60 Minutes Intravenous Every 24 hours 11/18/23 0930 12/04/23 1029   11/13/23 1700  Ampicillin -Sulbactam (UNASYN ) 3 g in sodium chloride  0.9 % 100 mL IVPB        3 g 200 mL/hr over 30 Minutes Intravenous Every 6 hours 11/13/23 1555 11/27/23 1239   11/13/23 0600  cefoTEtan  (CEFOTAN ) 2 g in sodium chloride  0.9 % 100 mL IVPB        2 g 200 mL/hr over 30 Minutes Intravenous On call to O.R. 11/13/23 0043 11/13/23 0954       Assessment/Plan:  Patient is status post revision of end colostomy and partial colectomy with repair of parastomal hernia for necrosis of previous and  colostomy who developed EC fistulas.  She has had drains in place.  She recently got another IR drain that is putting out purulent fluid.  Over the weekend she has started to have some enteric drainage from her prior ostomy site.  She had this about 90 per shift out from this.  However, she has a worsening leukocytosis.  She is on antibiotics.  Otherwise she will look okay all things considered.  Will continue on a soft diet today.  Will need to measure and record was coming out of her Eakin's pouch although I get the sense that it is a low output fistula.  We will continue on oral antibiotics.  If she has worsening leukocytosis tomorrow may need to switch to IV or consider repeating a scan to ensure that there is no other intra-abdominal fluid collections that are contributing to this.  I am hopeful if she does better today and there is not a profound worsening of her  leukocytosis then she can possibly be discharged tomorrow.    Kristin Miller, M.D. [facility]

## 2023-12-14 NOTE — Consult Note (Addendum)
 WOC Nurse ostomy follow up: EMR indicates patient may discharge this week if stable.  Reviewed Eakin pouch change process with patient and husband at the bedside.  Left abd has 2 leaking fistula sites, one is a pinhole opening near the current ostomy with small amt yellow drainage, and other is left outer abd located in a crease over the former ostomy site; .2X.2cm, mod amt brown liquid.  Made a pattern to cover both sites and small Eakin pouch used to contain the drainage.  Demonstrated crusing technique using ostomy powder and skin prep or water  to protect skin. Emptied 50cc brown drainage.  Husband was able to apply the Eakin pouch, pattern left in the room with 4 sets of each ostomy supply. Use supplies: 1. barrier ring and 2 piece 2 1/4 inch barrier and  pouch over the midline draining site, 2. small Merian Stamps, Lawson # [phone] over left abd two fistula sites (pattern at the bedside)  Ostomy pouch changed; husband is independent with pouch application and emptying.  Stoma is red and viable, 1 1/2 inches, above skin level.  Emptied 50cc liquid brown stool from pouch.  Pouching system is in close proximity to the Eakin pouch.  Discussed pouching routines and ordering supplies.  Pt currently uses [facility] for supplies. She could benefit from home health assistance after discharge.  Provided with information regarding the outpatient ostomy clinic if needed after discharge.  Thank-you,  Wiliam Harder MSN, RN, CWOCN, [place], CNS [phone]

## 2023-12-14 NOTE — Progress Notes (Signed)
 IV team consulted for PICC lumen occlusion. Labs deferred until access can be stabilized as pt is a limb alert on other arm

## 2023-12-14 NOTE — Progress Notes (Signed)
 Nutrition Follow-up  DOCUMENTATION CODES:   Not applicable  INTERVENTION:   -Continue Ensure Enlive po BID, each supplement provides 350 kcal and 20 grams of protein.  -Continue Magic cup TID with meals, each supplement provides 290 kcal and 9 grams of protein  -MVI with minerals daily -Continue soft diet  NUTRITION DIAGNOSIS:   Inadequate oral intake related to altered GI function as evidenced by NPO status.  Ongoing  GOAL:   Patient will meet greater than or equal to 90% of their needs  Progressing   MONITOR:   PO intake, Supplement acceptance, Diet advancement, Labs, Weight trends, Skin, I & O's  REASON FOR ASSESSMENT:   Consult New TPN/TNA  ASSESSMENT:   88 y/o female with h/o HTN, breast cancer s/p lumpectomy/XRT, IDA, HTN, chronic back pain, colon cancer (s/p open low anterior resection with end colostomy, abdominal wall resection to include psoas and left transversus abdominus muscles and left oophorectomy 07/2022 & s/p XRT) complicated by SBO (medically managed 07/2022) and flank hernia (s/p robotic assisted laparoscopic left colectomy w/ end colostomy, repair parastomal hernia and repair enterotomy 10/27/2023) and ostomy necrosis s/p revision of end colostomy requiring partial colectomy and resiting of the end colostomy to the right of the abdominal wall & repair of parastomal hernia 4/25 complicated by fluid collections and 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  5/2- s/p IR drain placement x 2 5/12- advanced to clear liquid diet 5/15- advanced to full liquid diet 5/16- advanced to soft diet, IR drains removed 5/19- TPN decreased to 35 ml/hr 5/21- per general surgery fistula seems to be closed, TPN d/c 5/22- s/p  Image guided drain placement, LUQ.  7F pigtail drain.   Reviewed I/O's: -30 ml x 24 hours and -392 ml since 11/30/23  Drain output: 90 ml x 24 hours   Pt unavailable at time of visit. Attempted to speak with pt via call to hospital room phone, however, unable to reach.    Pt remains on a soft diet. Intake has been good; meal completions 50-100%. Pt also drinking Ensure supplements.  Wt has been stable over the past week.   Plan to discharge this week if stable.   Medications reviewed and include cipro , lovenox , imodium , and protonix .  Labs reviewed: CBGS: 123-129 (inpatient orders for glycemic control are 0-9 units insulin  aspart every 6 hours).    Diet Order:   Diet Order             Diet general           DIET SOFT Room service appropriate? Yes; Fluid consistency: Thin  Diet effective now                   EDUCATION NEEDS:   Education needs have been addressed  Skin:  Skin Assessment: Skin Integrity Issues: Skin Integrity Issues:: Incisions Incisions: closed abdomen  Last BM:  12/07/23 (70 ml via colostomy)  Height:   Ht Readings from Last 1 Encounters:  11/20/23 5' (1.524 m)    Weight:   Wt Readings from Last 1 Encounters:  12/14/23 58.7 kg    BMI:  Body mass index is 25.27 kg/m.  Estimated Nutritional Needs:   Kcal:  1600-1800kcal/day  Protein:  80-90g/day  Fluid:  1.4-1.6L/day    Herschel Lords, RD, LDN, CDCES Registered Dietitian III Certified Diabetes Care and Education Specialist If unable to reach this RD, please use "RD Inpatient" group chat on secure chat between hours of 8am-4 pm daily

## 2023-12-15 LAB — AEROBIC/ANAEROBIC CULTURE W GRAM STAIN (SURGICAL/DEEP WOUND)

## 2023-12-15 LAB — BASIC METABOLIC PANEL WITH GFR
Anion gap: 9 (ref 5–15)
BUN: 12 mg/dL (ref 8–23)
CO2: 24 mmol/L (ref 22–32)
Calcium: 7.8 mg/dL — ABNORMAL LOW (ref 8.9–10.3)
Chloride: 105 mmol/L (ref 98–111)
Creatinine, Ser: 0.58 mg/dL (ref 0.44–1.00)
GFR, Estimated: >60 mL/min (ref >60)
Glucose, Bld: 123 mg/dL — ABNORMAL HIGH (ref 70–99)
Potassium: 3.3 mmol/L — ABNORMAL LOW (ref 3.5–5.1)
Sodium: 138 mmol/L (ref 135–145)

## 2023-12-15 LAB — GLUCOSE, CAPILLARY
Glucose-Capillary: 114 mg/dL — ABNORMAL HIGH (ref 70–99)
Glucose-Capillary: 95 mg/dL (ref 70–99)
Glucose-Capillary: 96 mg/dL (ref 70–99)
Glucose-Capillary: 99 mg/dL (ref 70–99)

## 2023-12-15 LAB — CBC
HCT: 29 % — ABNORMAL LOW (ref 36.0–46.0)
Hemoglobin: 9.2 g/dL — ABNORMAL LOW (ref 12.0–15.0)
MCH: 27.7 pg (ref 26.0–34.0)
MCHC: 31.7 g/dL (ref 30.0–36.0)
MCV: 87.3 fL (ref 80.0–100.0)
Platelets: 383 10*3/uL (ref 150–400)
RBC: 3.32 MIL/uL — ABNORMAL LOW (ref 3.87–5.11)
RDW: 19.1 % — ABNORMAL HIGH (ref 11.5–15.5)
WBC: 13.1 10*3/uL — ABNORMAL HIGH (ref 4.0–10.5)
nRBC: 0 % (ref 0.0–0.2)

## 2023-12-15 NOTE — Plan of Care (Signed)

## 2023-12-15 NOTE — TOC Progression Note (Signed)
 Transition of Care [facility]) - Progression Note    Patient Details  Name: Kristin Miller MRN: [id number] Date of Birth: [birth year]  [facility] CM/SW Contact  Loman Risk, RN Phone Number: 12/15/2023, 9:40 AM  Clinical Narrative:     Plan remains fr patient to discharge with [facility] home health when medically appropriate        Expected Discharge Plan and Services         Expected Discharge Date: 12/11/23                                     Social Determinants of Health (SDOH) Interventions SDOH Screenings   Food Insecurity: No Food Insecurity (11/14/2023)  Housing: Low Risk  (11/24/2023)  Transportation Needs: No Transportation Needs (11/14/2023)  Utilities: Not At Risk (11/14/2023)  Alcohol Screen: Low Risk  (09/25/2021)  Depression (PHQ2-9): Low Risk  (08/25/2023)  Financial Resource Strain: Low Risk  (01/09/2023)   Received from [facility], [facility]  Physical Activity: Inactive (09/25/2021)  Social Connections: Moderately Integrated (11/14/2023)  Stress: No Stress Concern Present (09/25/2021)  Tobacco Use: Low Risk  (11/13/2023)    Readmission Risk Interventions    11/20/2023    8:57 AM  Readmission Risk Prevention Plan  Transportation Screening Complete  PCP or Specialist Appt within 5-7 Days Complete  Medication Review (RN CM) Complete

## 2023-12-15 NOTE — Progress Notes (Signed)
 IVT to bedside to assess PICC line for occulusion. Caps changed to both lumens, flushing well with GBR.  Pt unsure of possible discharge date at this time.  Line to remain in until order from provider to discontinue.

## 2023-12-15 NOTE — TOC Transition Note (Signed)
 Transition of Care [facility]) - Discharge Note   Patient Details  Name: Kristin Miller MRN: [id number] Date of Birth: 1936/07/18  Transition of [facility]) CM/SW Contact:  Loman Risk, RN Phone Number: 12/15/2023, 11:03 AM   Clinical Narrative:     Patient to discharge today [name]  with [facility] notified of discharge Bedside RN to send home some pouching supplies with patient        Patient Goals and CMS Choice            Discharge Placement                       Discharge Plan and Services Additional resources added to the After Visit Summary for                                       Social Drivers of Health (SDOH) Interventions SDOH Screenings   Food Insecurity: No Food Insecurity (11/14/2023)  Housing: Low Risk  (11/24/2023)  Transportation Needs: No Transportation Needs (11/14/2023)  Utilities: Not At Risk (11/14/2023)  Alcohol Screen: Low Risk  (09/25/2021)  Depression (PHQ2-9): Low Risk  (08/25/2023)  Financial Resource Strain: Low Risk  (01/09/2023)   Received from [facility]  Physical Activity: Inactive (09/25/2021)  Social Connections: Moderately Integrated (11/14/2023)  Stress: No Stress Concern Present (09/25/2021)  Tobacco Use: Low Risk  (11/13/2023)     Readmission Risk Interventions    11/20/2023    8:57 AM  Readmission Risk Prevention Plan  Transportation Screening Complete  PCP or Specialist Appt within 5-7 Days Complete  Medication Review (RN CM) Complete

## 2023-12-15 NOTE — Discharge Summary (Signed)
 [facility] SURGICAL DISCHARGE SUMMARY  Patient ID: Kristin Miller MRN: [id number] DOB/AGE: June 26, 1936 88 y.o.  Admit date: 11/13/2023 Discharge date: 12/15/2023  Discharge Diagnoses Colostomy Revision Intra-Abdominal Abscess Entero-abdominal Fistula   Consultants Interventional Radiology Medicine    Procedures 11/13/2023:  Revision of end colostomy requiring partial colectomy and resiting of the end colostomy to the right of the abdominal wall Repair of parastomal hernia     HPI: Kristin Miller is a 88 y.o. with history of colostomy with recurrent CA s/p revision who presents to [facility] on 04/25 for colostomy revision secondary to necrosis   Hospital Course: Informed consent was obtained and documented, and patient underwent uneventful colostomy revision and parastomal hernia repair (Dr Dana Duncan, 11/13/2023).  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 This was controlled with surgical drains. She did require additional drain placement on 05/06 (since removed) and 05/22 with interventional radiology. Ultimately, he drain output slowed significantly. She did require TPN but was weaned from this. Diet was advanced without issue. She progressed to [facility] with therapies. Initial plan for DC on 05/23 however she began to have drainage from previous colostomy incision. This was minimal in quantity. Eakin pouch was placed and able to manage output. The remainder of patient's hospital course was essentially unremarkable, and discharge planning was initiated accordingly with patient safely able to be discharged home with appropriate discharge instructions, antibiotics (Cipro/Flagyl  x9 days to complete 14 total), pain control, and outpatient follow-up after all of her and her family's questions were answered to their expressed satisfaction.     Discharge Condition: Good     Physical Examination:  Constitutional: alert, cooperative and  no distress  Respiratory: breathing non-labored at rest  Cardiovascular: regular rate and sinus rhythm  Gastrointestinal: soft, non-tender, and non-distended. Colostomy in right abdomen; gas and dark liquid in bag, drains x2 present; output slowed/stopped. Newly placed IR drain in LUQ Integumentary: Left abdominal incision at site of previous colostomy is covered with Eakin, minimal drainage in bag.    Allergies as of 12/15/2023       Reactions   Combigan  [brimonidine  Tartrate-timolol ]    Redness, burning, irritation   Lisinopril  Cough   Other Itching, Rash   SUTURES-(PARTIAL HYSTERECTOMY)        Medication List     STOP taking these medications    amoxicillin -clavulanate 875-125 MG tablet Commonly known as: AUGMENTIN    neomycin  500 MG tablet Commonly known as: MYCIFRADIN        TAKE these medications    acetaminophen  500 MG tablet Commonly known as: TYLENOL  Take 500 mg by mouth every 6 (six) hours as needed for pain.   alendronate  70 MG tablet Commonly known as: FOSAMAX  Take 1 tablet (70 mg total) by mouth every 7 (seven) days for 48 doses. Take with a full glass of water  on an empty stomach.   amLODipine  5 MG tablet Commonly known as: NORVASC  Take 1 tablet (5 mg total) by mouth daily.   CAL MAG ZINC +D3 PO Take 1 tablet by mouth daily.   ciprofloxacin 500 MG tablet Commonly known as: CIPRO Take 1 tablet (500 mg total) by mouth 2 (two) times daily for 13 days.   diphenoxylate -atropine  2.5-0.025 MG tablet Commonly known as: LOMOTIL  Take 2 tablets by mouth 4 (four) times daily.   docusate sodium 100 MG capsule Commonly known as: COLACE Take 100 mg by mouth daily as needed for mild constipation.   latanoprost  0.005 % ophthalmic solution Commonly known as: XALATAN  1 drop at  bedtime. Eye doctor both eyes   loperamide  2 MG capsule Commonly known as: IMODIUM  Take 1 capsule (2 mg total) by mouth as needed for diarrhea or loose stools. May take 1-2 capsules  every 8 hours   losartan -hydrochlorothiazide  100-12.5 MG tablet Commonly known as: HYZAAR Take 1 tablet by mouth daily.   metroNIDAZOLE  500 MG tablet Commonly known as: FLAGYL  Take 1 tablet (500 mg total) by mouth 3 (three) times daily for 13 days. What changed:  how much to take how to take this when to take this additional instructions   oxyCODONE  5 MG immediate release tablet Commonly known as: Oxy IR/ROXICODONE  Take 1 tablet (5 mg total) by mouth every 4 (four) hours as needed for severe pain (pain score 7-10).   potassium chloride  SA 20 MEQ tablet Commonly known as: KLOR-CON  M Take 2 tablets (40 mEq total) by mouth daily.               Durable Medical Equipment  (From admission, onward)           Start     Ordered   12/10/23 0941  For home use only DME 4 wheeled rolling walker with seat  Once       Question:  Patient needs a walker to treat with the following condition  Answer:  Weakness   12/10/23 0940              Follow-up Information     Alben Alma, MD. Go on 12/21/2023.   Specialty: General Surgery Why: Go to appointment on 06/02 at 200 PM Contact information: [address] [place] [address] [phone]                  Time spent on discharge management including discussion of hospital course, clinical condition, outpatient instructions, prescriptions, and follow up with the patient and members of the medical team: >30 minutes  -- Apolonio Bay , PA-C [facility]  12/15/2023, 10:39 AM [phone] M-F: 7am - 4pm

## 2023-12-15 NOTE — Progress Notes (Signed)
 Physical Therapy Treatment Patient Details Name: Kristin Miller MRN: [id number] DOB: 1935-12-20 Today's Date: 12/15/2023   History of Present Illness Kristin Miller presented to [facility] on 4/25 for planned surgery, with the diagnosis of colostomy complication; now s/p revision. Other dx include back pain, glaucoma, HTN, COPD.    PT Comments  Pt was pleasant and motivated to participate during the session and put forth good effort throughout. Pt required no physical assistance during the session and demonstrated fair carryover during transfer training to/from rollator.  Pt participated in multiple bouts of gait training with the rollator per below with no overt LOB. Pt reported no adverse symptoms during the session with SpO2 and HR WNL throughout on room air.  Overall pt continues to make good progress towards functional goals.  Pt will benefit from continued PT services upon discharge to safely address deficits listed in patient problem list for decreased caregiver assistance and eventual return to PLOF.       If plan is discharge home, recommend the following: Help with stairs or ramp for entrance;Assist for transportation;Assistance with cooking/housework;A little help with walking and/or transfers;A little help with bathing/dressing/bathroom   Can travel by private vehicle     Yes  Equipment Recommendations  Rollator (4 wheels)    Recommendations for Other Services       Precautions / Restrictions Precautions Precautions: Fall Recall of Precautions/Restrictions: Intact Restrictions Weight Bearing Restrictions Per Provider Order: No Other Position/Activity Restrictions: 3 drains and an ostomy     Mobility  Bed Mobility Overal bed mobility: Needs Assistance     Sidelying to sit: Supervision       General bed mobility comments: Min extra time and effort only    Transfers Overall transfer level: Needs assistance Equipment used: Rollator (4 wheels) Transfers: Sit  to/from Stand Sit to Stand: Supervision           General transfer comment: Min to mod verbal and visual cues for proper sequencing with transfers with a rollator including proper use of breaks    Ambulation/Gait Ambulation/Gait assistance: Supervision Gait Distance (Feet): 150 Feet x 1, 2 x 75 Feet Assistive device: Rollator (4 wheels) Gait Pattern/deviations: Step-through pattern, Decreased step length - right, Decreased step length - left Gait velocity: decreased     General Gait Details: Slow cadence but steady throughout with no overt LOB   Stairs             Wheelchair Mobility     Tilt Bed    Modified Rankin (Stroke Patients Only)       Balance Overall balance assessment: Needs assistance Sitting-balance support: Feet supported Sitting balance-Leahy Scale: Good     Standing balance support: During functional activity, Bilateral upper extremity supported Standing balance-Leahy Scale: Good                              Communication Communication Communication: No apparent difficulties  Cognition Arousal: Alert Behavior During Therapy: WFL for tasks assessed/performed   PT - Cognitive impairments: No apparent impairments                         Following commands: Intact      Cueing Cueing Techniques: Verbal cues, Visual cues  Exercises Other Exercises Other Exercises: Multiple sit to/from stand transfers to rollator for proper sequencing training    General Comments        Pertinent Vitals/Pain Pain  Assessment Pain Assessment: No/denies pain    Home Living                          Prior Function            PT Goals (current goals can now be found in the care plan section) Progress towards PT goals: Progressing toward goals    Frequency    Min 2X/week      PT Plan      Co-evaluation              AM-PAC PT "6 Clicks" Mobility   Outcome Measure  Help needed turning from your back  to your side while in a flat bed without using bedrails?: A Little Help needed moving from lying on your back to sitting on the side of a flat bed without using bedrails?: A Little Help needed moving to and from a bed to a chair (including a wheelchair)?: A Little Help needed standing up from a chair using your arms (e.g., wheelchair or bedside chair)?: A Little Help needed to walk in hospital room?: A Little Help needed climbing 3-5 steps with a railing? : A Little 6 Click Score: 18    End of Session Equipment Utilized During Treatment: Gait belt;Other (comment) (placed high to avoid drains/ostomy) Activity Tolerance: Patient tolerated treatment well Patient left: with call bell/phone within reach;in chair Nurse Communication: Mobility status PT Visit Diagnosis: Muscle weakness (generalized) (M62.81);Difficulty in walking, not elsewhere classified (R26.2);Unsteadiness on feet (R26.81)     Time: 9604-5409 PT Time Calculation (min) (ACUTE ONLY): 29 min  Charges:    $Gait Training: 8-22 mins $Therapeutic Activity: 8-22 mins PT General Charges $$ ACUTE PT VISIT: 1 Visit                     D. Scott Kwinton Maahs PT, DPT 12/15/23, 10:59 AM

## 2023-12-16 ENCOUNTER — Encounter: Admitting: Surgery

## 2023-12-17 ENCOUNTER — Telehealth: Payer: Self-pay | Admitting: Surgery

## 2023-12-17 NOTE — Telephone Encounter (Signed)
 Spoke with the patient's spouse and per Dr Dana Duncan no need to take any more antibiotics. She should be fully covered. She will follow up as scheduled this Monday with Dr Dana Duncan.

## 2023-12-17 NOTE — Telephone Encounter (Signed)
 Pt husband is calling and said that pt just got out of the hosp a couple of days and they gave her antibiotic and it is making her sick. This happened in the hospital and is very sick. Pt husband Nicholette Barley wants to know what they can do. Please advise. Phone # is [phone]

## 2023-12-19 DIAGNOSIS — Z8701 Personal history of pneumonia (recurrent): Secondary | ICD-10-CM | POA: Diagnosis not present

## 2023-12-19 DIAGNOSIS — I10 Essential (primary) hypertension: Secondary | ICD-10-CM | POA: Diagnosis not present

## 2023-12-19 DIAGNOSIS — G43909 Migraine, unspecified, not intractable, without status migrainosus: Secondary | ICD-10-CM | POA: Diagnosis not present

## 2023-12-19 DIAGNOSIS — K632 Fistula of intestine: Secondary | ICD-10-CM | POA: Diagnosis not present

## 2023-12-19 DIAGNOSIS — K9403 Colostomy malfunction: Secondary | ICD-10-CM | POA: Diagnosis not present

## 2023-12-19 DIAGNOSIS — R32 Unspecified urinary incontinence: Secondary | ICD-10-CM | POA: Diagnosis not present

## 2023-12-19 DIAGNOSIS — M1990 Unspecified osteoarthritis, unspecified site: Secondary | ICD-10-CM | POA: Diagnosis not present

## 2023-12-19 DIAGNOSIS — Z853 Personal history of malignant neoplasm of breast: Secondary | ICD-10-CM | POA: Diagnosis not present

## 2023-12-19 DIAGNOSIS — M8580 Other specified disorders of bone density and structure, unspecified site: Secondary | ICD-10-CM | POA: Diagnosis not present

## 2023-12-19 DIAGNOSIS — D649 Anemia, unspecified: Secondary | ICD-10-CM | POA: Diagnosis not present

## 2023-12-19 DIAGNOSIS — K651 Peritoneal abscess: Secondary | ICD-10-CM | POA: Diagnosis not present

## 2023-12-21 ENCOUNTER — Encounter: Payer: Self-pay | Admitting: Surgery

## 2023-12-21 ENCOUNTER — Other Ambulatory Visit: Payer: Self-pay | Admitting: Family Medicine

## 2023-12-21 ENCOUNTER — Telehealth: Payer: Self-pay | Admitting: Family Medicine

## 2023-12-21 ENCOUNTER — Ambulatory Visit (INDEPENDENT_AMBULATORY_CARE_PROVIDER_SITE_OTHER): Admitting: Surgery

## 2023-12-21 ENCOUNTER — Other Ambulatory Visit: Payer: Self-pay

## 2023-12-21 VITALS — BP 117/73 | HR 112 | Temp 97.8°F | Ht 60.0 in | Wt 119.2 lb

## 2023-12-21 DIAGNOSIS — Z7189 Other specified counseling: Secondary | ICD-10-CM

## 2023-12-21 DIAGNOSIS — Z09 Encounter for follow-up examination after completed treatment for conditions other than malignant neoplasm: Secondary | ICD-10-CM

## 2023-12-21 NOTE — Patient Instructions (Addendum)
 Do try to eat a high protein diet. This will help with wound healing. May drink high protein drinks like Premier Protein or Ensure.   Change your Band-Aid daily until the areas stop draining.    Follow-up with our office on Monday.   Please call and ask to speak with a nurse if you develop questions or concerns.  High-Protein and High-Calorie Diet Eating high-protein and high-calorie foods can help you to gain weight, heal after an injury, and get better after an illness or surgery. The amount of daily protein and calories you need depends on: Your body weight. The reason you were told to follow this diet. Usually, a high-protein, high-calorie diet means that you should: Eat 250-500 extra calories each day. Make sure that you get enough of your daily calories from protein. Ask your health care provider how many of your calories should come from protein and how many calories total you need each day. Follow the diet as told by your provider. What are tips for following this plan? Reading food labels Check the nutrition facts label for calories, and grams of fat and protein. Items with more than 4 grams of protein are high-protein foods. General information  Ask your provider if you should take a nutritional supplement. Try to eat six small meals each day instead of three large meals. A goal is usually to eat every 2 to 3 hours. Eat a balanced diet. In each meal, include one food that's high in protein and one food with fat in it. Keep nutritious snacks available, such as nuts, trail mixes, dried fruit, and whole-milk yogurt. If you have kidney disease or diabetes, talk with your provider about how much protein is safe for you. Too much protein may put extra stress on your kidneys. Replace zero-calorie drinks with drinks that have calories in them, such as milk and 100% fruit juice. Consider setting a timer to remind you to eat. You'll want to eat even if you do not feel very  hungry. Preparing meals Milk and dairy foods. Add whole milk, half-and-half, or heavy cream to cereal, pudding, soup, or hot cocoa. Add whole milk to instant breakfast drinks. Add powdered milk to baked goods, smoothies, or milkshakes. Add powdered milk, cream, or butter to mashed potatoes. Replace water  with milk or heavy cream when making foods such as oatmeal, pudding, or cocoa. Make cream-based pastas and soups. Add cheese to cooked vegetables. Make whole-milk yogurt parfaits. Top them with granola, fruit, or nuts. Add cottage cheese to fruit. Add cream cheese to sandwiches or as a topping on crackers and bread. Eggs. Add hard-boiled eggs to salads. Keep hard-boiled eggs in the fridge to snack on. Add cheese to cooked eggs. Beans, nuts, and seeds. Add peanut butter to oatmeal or smoothies. Use peanut butter as a dip for fruits and vegetables or as a topping for pretzels, celery, or crackers. Add beans to casseroles, dips, and spreads. Add pureed beans to sauces and soups. Salads, soups, and other foods. Add avocado, cheese, or both to sandwiches or salads. Add avocado to smoothies. Add meat, poultry, or seafood to rice, pasta, casseroles, salads, and soups. Use mayonnaise when making egg salad, chicken salad, or tuna salad. Add oil or butter to cooked vegetables and grains. What high-protein foods should I eat?  Vegetables Soybeans. Peas. Grains Quinoa. Bulgur wheat. Buckwheat. Meats and other proteins Beef, pork, and poultry. Fish and seafood. Eggs. Tofu. Textured vegetable protein (TVP). Peanut butter. Nuts and seeds. Dried beans. Protein powders. Hummus.  Jerky. Dairy Whole milk. Whole-milk yogurt. Powdered milk. Cheese. Cottage cheese. Eggnog. Beverages High-protein supplement drinks. Soy milk. Other foods Protein bars. The items listed above may not be all the foods and drinks you can have. Talk with an expert in healthy eating called a dietitian to learn more. What  high-calorie foods should I eat? Fruits Dried fruit. Fruit leather. Canned fruit in syrup. Fruit juice. Avocado. Vegetables Vegetables cooked in oil or butter. Fried potatoes. Grains Pasta. Quick breads. Muffins. Pancakes. Granola. Meats and other proteins Peanut butter and other nut butters. Nuts and seeds. Dairy Heavy cream. Whipped cream. Cream cheese. Sour cream. Ice cream. Custard. Pudding. Whole-milk dairy products. Beverages Meal-replacement beverages. Nutrition shakes. Fruit juice. Seasonings and condiments Salad dressing. Mayonnaise. Alfredo sauce. Fruit preserves or jelly. Honey. Syrup. Sweets and desserts Cake. Cookies. Pie. Pastries. Candy bars. Chocolate. Fats and oils Butter or margarine. Oil. Gravy. Other foods Meal-replacement bars. The items listed above may not be all the foods and drinks you can have. Talk with an expert in healthy eating to learn more. This information is not intended to replace advice given to you by your health care provider. Make sure you discuss any questions you have with your health care provider. Document Revised: 12/01/2022 Document Reviewed: 12/01/2022 Elsevier Patient Education  2024 [employer].

## 2023-12-21 NOTE — Telephone Encounter (Signed)
 Copied from CRM [id number]. Topic: Clinical - Home Health Verbal Orders >> Dec 21, 2023  9:40 AM Sophia H wrote: Caller/Agency: Tim Fontana  Callback Number: [phone] Service Requested: Skilled Nursing Frequency: Once a week for 3 weeks / every other week for 6 weeks (colostomy, jp drains, eakins pouch management) Any new concerns about the patient? No

## 2023-12-21 NOTE — Progress Notes (Signed)
 Destony 7 weeks out from colectomy with end colostomy.  She developed full necrosis of her stoma requiring revision.  She is now 5 weeks from the revision.  During the revision her abdomen was very hostile with severe inflammation of the small bowel resulting in a small fistula.  She had a prolonged hospitalization for about a month.  She did have an intra-abdominal abscess that was drained by interventional radiology.  She now comes in for follow-up.  Husband describes that her right drain fell off.  She has been taking diet and her colostomy is working.  Now she has got an Eakin pouch to the fistula and is draining about 1 ounce a day.  No fevers no chills.  PE NAD chronically debilitated Abdomen soft prior drain site on the right side healing well.  The ostomy is widely patent and working with good stool.  She does have an Eakin pouch to the left side with minimal feculent output.  She does have an additional 2 drains with 0 output.  We went ahead and remove them.  She tolerated these well.   A/P recovering as expected from major abdominal surgeries and fistulous.  She does not have a very low output fistula that should close with time.  Encouraged her ambulation and nutritional support.  I will see her back in a week

## 2023-12-22 DIAGNOSIS — M1990 Unspecified osteoarthritis, unspecified site: Secondary | ICD-10-CM | POA: Diagnosis not present

## 2023-12-22 DIAGNOSIS — D649 Anemia, unspecified: Secondary | ICD-10-CM | POA: Diagnosis not present

## 2023-12-22 DIAGNOSIS — K651 Peritoneal abscess: Secondary | ICD-10-CM | POA: Diagnosis not present

## 2023-12-22 DIAGNOSIS — I10 Essential (primary) hypertension: Secondary | ICD-10-CM | POA: Diagnosis not present

## 2023-12-22 DIAGNOSIS — K9403 Colostomy malfunction: Secondary | ICD-10-CM | POA: Diagnosis not present

## 2023-12-22 DIAGNOSIS — Z853 Personal history of malignant neoplasm of breast: Secondary | ICD-10-CM | POA: Diagnosis not present

## 2023-12-22 DIAGNOSIS — G43909 Migraine, unspecified, not intractable, without status migrainosus: Secondary | ICD-10-CM | POA: Diagnosis not present

## 2023-12-22 DIAGNOSIS — Z8701 Personal history of pneumonia (recurrent): Secondary | ICD-10-CM | POA: Diagnosis not present

## 2023-12-22 DIAGNOSIS — K632 Fistula of intestine: Secondary | ICD-10-CM | POA: Diagnosis not present

## 2023-12-22 DIAGNOSIS — M8580 Other specified disorders of bone density and structure, unspecified site: Secondary | ICD-10-CM | POA: Diagnosis not present

## 2023-12-22 DIAGNOSIS — R32 Unspecified urinary incontinence: Secondary | ICD-10-CM | POA: Diagnosis not present

## 2023-12-24 NOTE — Telephone Encounter (Signed)
 Copied from CRM [id number]. Topic: Clinical - Home Health Verbal Orders >> Dec 23, 2023  4:17 PM [place] E wrote: Caller/Agency: Phu, PT with [facility] Likes Number: [phone] Service Requested: Physical Therapy Frequency: 1x / week for 9 weeks Any new concerns about the patient? No

## 2023-12-24 NOTE — Telephone Encounter (Signed)
**Note De-identified  [place] Obfuscation** Please advise 

## 2023-12-25 NOTE — Telephone Encounter (Signed)
 Verbal orders were provided per order of Ziglar, MD.

## 2023-12-28 ENCOUNTER — Inpatient Hospital Stay: Payer: Self-pay

## 2023-12-28 ENCOUNTER — Emergency Department
Admission: EM | Admit: 2023-12-28 | Discharge: 2023-12-28 | Disposition: A | Discharge status: Home / Self Care | Attending: Emergency Medicine | Admitting: Emergency Medicine

## 2023-12-28 ENCOUNTER — Inpatient Hospital Stay: Payer: Self-pay | Admitting: Oncology

## 2023-12-28 ENCOUNTER — Other Ambulatory Visit: Payer: Self-pay

## 2023-12-28 ENCOUNTER — Encounter: Payer: Self-pay | Admitting: Emergency Medicine

## 2023-12-28 ENCOUNTER — Encounter: Admitting: Surgery

## 2023-12-28 ENCOUNTER — Telehealth: Payer: Self-pay | Admitting: Oncology

## 2023-12-28 DIAGNOSIS — Z933 Colostomy status: Secondary | ICD-10-CM | POA: Diagnosis not present

## 2023-12-28 DIAGNOSIS — K9400 Colostomy complication, unspecified: Secondary | ICD-10-CM | POA: Diagnosis not present

## 2023-12-28 DIAGNOSIS — I110 Hypertensive heart disease with heart failure: Secondary | ICD-10-CM | POA: Insufficient documentation

## 2023-12-28 DIAGNOSIS — I509 Heart failure, unspecified: Secondary | ICD-10-CM | POA: Diagnosis not present

## 2023-12-28 DIAGNOSIS — Z85038 Personal history of other malignant neoplasm of large intestine: Secondary | ICD-10-CM | POA: Diagnosis not present

## 2023-12-28 DIAGNOSIS — K9409 Other complications of colostomy: Secondary | ICD-10-CM | POA: Diagnosis not present

## 2023-12-28 DIAGNOSIS — L24B3 Irritant contact dermatitis related to fecal or urinary stoma or fistula: Secondary | ICD-10-CM | POA: Diagnosis not present

## 2023-12-28 DIAGNOSIS — Z433 Encounter for attention to colostomy: Secondary | ICD-10-CM | POA: Diagnosis not present

## 2023-12-28 DIAGNOSIS — Z743 Need for continuous supervision: Secondary | ICD-10-CM | POA: Diagnosis not present

## 2023-12-28 DIAGNOSIS — R58 Hemorrhage, not elsewhere classified: Secondary | ICD-10-CM | POA: Diagnosis not present

## 2023-12-28 LAB — CBC WITH DIFFERENTIAL/PLATELET
Abs Immature Granulocytes: 0.04 10*3/uL (ref 0.00–0.07)
Basophils Absolute: 0.1 10*3/uL (ref 0.0–0.1)
Basophils Relative: 1 %
Eosinophils Absolute: 0.1 10*3/uL (ref 0.0–0.5)
Eosinophils Relative: 1 %
HCT: 32.7 % — ABNORMAL LOW (ref 36.0–46.0)
Hemoglobin: 10.4 g/dL — ABNORMAL LOW (ref 12.0–15.0)
Immature Granulocytes: 0 %
Lymphocytes Relative: 18 %
Lymphs Abs: 1.7 10*3/uL (ref 0.7–4.0)
MCH: 27.7 pg (ref 26.0–34.0)
MCHC: 31.8 g/dL (ref 30.0–36.0)
MCV: 87.2 fL (ref 80.0–100.0)
Monocytes Absolute: 1.2 10*3/uL — ABNORMAL HIGH (ref 0.1–1.0)
Monocytes Relative: 13 %
Neutro Abs: 6.2 10*3/uL (ref 1.7–7.7)
Neutrophils Relative %: 67 %
Platelets: 368 10*3/uL (ref 150–400)
RBC: 3.75 MIL/uL — ABNORMAL LOW (ref 3.87–5.11)
RDW: 17.9 % — ABNORMAL HIGH (ref 11.5–15.5)
WBC: 9.4 10*3/uL (ref 4.0–10.5)
nRBC: 0 % (ref 0.0–0.2)

## 2023-12-28 NOTE — Consult Note (Signed)
 [facility] SURGICAL CONSULTATION NOTE (initial) - cpt: [phone]   HISTORY OF PRESENT ILLNESS (HPI):  88 y.o. female presented to [facility] ED today secondary to oozing from her previous colostomy site. Patient is well known to our service secondary to history of colostomy revision complicated by full necrosis of her ostomy requiring revision which was complicated by severely hostile abdomen. She unfortunately developed EC fistula which was controlled with drains and ultimately Eakin pouch. She also required IR placement of drains for abscess. These have since been removed or fallen out. She was due to follow up in clinic this afternoon however her husband noticed more sanguinous appearing drainage in her Eakin pouch this morning which was concerning. This was about <10 ccs. She otherwise denied any significant abdominal pain, fever, chills, nausea, emesis, dizziness. She is tolerating PO but reports a decreased appetite. Colostomy is working; stool ins non-bloody.   Surgery is consulted by emergency medicine provider Dawayne Estrin, FNP in this context for evaluation and management of issue with EC fistula.  PAST MEDICAL HISTORY (PMH):  Past Medical History:  Diagnosis Date   Allergy    Anemia    H/O AS A CHILD   Arthritis    Back pain    Breast cancer of upper-outer quadrant of right female breast (HCC) 06/12/2015   4 millimeter, T1a, N0; ER 90%, PR 0%, HER-2/neu not overexpressed.  Declined radiation therapy.   Collar bone fracture 05/28/2013   Fractured Collar bone on left   Dyspnea    Dysrhythmia    h/o heart skipping beat years ago   Frequent headaches    h/o migraines   Hypertension    Osteopenia 04/2018   Bone density without significant interval change since 2017.   Pneumonia      PAST SURGICAL HISTORY [facility]):  Past Surgical History:  Procedure Laterality Date   ABDOMINAL HYSTERECTOMY  1970   Partial   BREAST BIOPSY Right 05/28/2015   INVASIVE MAMMARY  CARCINOMA    BREAST BIOPSY Right 06/12/2015   Wide excision, sentinel lymph node biopsy.   BREAST EXCISIONAL BIOPSY Right    BREAST LUMPECTOMY WITH AXILLARY LYMPH NODE DISSECTION Right 06/12/2015   4 mm, T1a,N0 withDCIS with clear margins. ER: 90%, PR 0%; Her 2 neu not overexpressed.    CATARACT EXTRACTION W/PHACO Left 02/03/2017   Procedure: CATARACT EXTRACTION PHACO AND INTRAOCULAR LENS PLACEMENT (IOC);  Surgeon: Clair Crews, MD;  Location: [facility] ORS;  Service: Ophthalmology;  Laterality: Left;  US   00:28 AP% 16.3 CDE 4.68 Fluid pack lot # [id number]   CATARACT EXTRACTION W/PHACO Right 03/01/2019   Procedure: CATARACT EXTRACTION PHACO AND INTRAOCULAR LENS PLACEMENT (IOC)  RIGHT;  Surgeon: Clair Crews, MD;  Location: [facility];  Service: Ophthalmology;  Laterality: Right;   COLONOSCOPY     COLONOSCOPY WITH PROPOFOL  N/A 06/27/2022   Procedure: COLONOSCOPY WITH PROPOFOL ;  Surgeon: Marnee Sink, MD;  Location: [facility];  Service: Endoscopy;  Laterality: N/A;   COLONOSCOPY WITH PROPOFOL  N/A 10/06/2023   Procedure: COLONOSCOPY WITH PROPOFOL ;  Surgeon: Marnee Sink, MD;  Location: [facility];  Service: Endoscopy;  Laterality: N/A;   COLOSTOMY REVISION N/A 11/13/2023   Procedure: REVISION, COLOSTOMY;  Surgeon: Alben Alma, MD;  Location: [facility] ORS;  Service: General;  Laterality: N/A;   ESOPHAGOGASTRODUODENOSCOPY (EGD) WITH PROPOFOL  N/A 06/27/2022   Procedure: ESOPHAGOGASTRODUODENOSCOPY (EGD) WITH PROPOFOL ;  Surgeon: Marnee Sink, MD;  Location: [facility];  Service: Endoscopy;  Laterality: N/A;   PARTIAL COLECTOMY N/A 07/29/2022  Procedure: PARTIAL COLECTOMY, open left, RNFA to assist;  Surgeon: Alben Alma, MD;  Location: [facility] ORS;  Service: General;  Laterality: N/A;   POLYPECTOMY  10/06/2023   Procedure: POLYPECTOMY;  Surgeon: Marnee Sink, MD;  Location: [facility] ENDOSCOPY;  Service: Endoscopy;;     MEDICATIONS:  Prior to Admission medications    Medication Sig Start Date End Date Taking? Authorizing Provider  acetaminophen  (TYLENOL ) 500 MG tablet Take 500 mg by mouth every 6 (six) hours as needed for pain.    [provider]  alendronate  (FOSAMAX ) 70 MG tablet Take 1 tablet (70 mg total) by mouth every 7 (seven) days for 48 doses. Take with a full glass of water  on an empty stomach. 07/21/23 06/15/24  Ziglar, Susan K, MD  amLODipine  (NORVASC ) 5 MG tablet Take 1 tablet (5 mg total) by mouth daily. 07/28/23   Ziglar, Susan K, MD  diphenoxylate -atropine  (LOMOTIL ) 2.5-0.025 MG tablet Take 2 tablets by mouth 4 (four) times daily. 12/11/23   Rino Hosea R, PA-C  docusate sodium (COLACE) 100 MG capsule Take 100 mg by mouth daily as needed for mild constipation.    [provider]  latanoprost  (XALATAN ) 0.005 % ophthalmic solution 1 drop at bedtime. Eye doctor both eyes 12/18/20   [provider]  loperamide  (IMODIUM ) 2 MG capsule Take 1 capsule (2 mg total) by mouth as needed for diarrhea or loose stools. May take 1-2 capsules every 8 hours 12/11/23   Olivya Sobol R, PA-C  losartan -hydrochlorothiazide  (HYZAAR) 100-12.5 MG tablet Take 1 tablet by mouth daily. 07/21/23   Ziglar, Susan K, MD  Multiple Minerals-Vitamins (CAL MAG ZINC +D3 PO) Take 1 tablet by mouth daily.    [provider]  potassium chloride  SA (KLOR-CON  M) 20 MEQ tablet Take 2 tablets (40 mEq total) by mouth daily. 11/09/23 12/09/23  Alben Alma, MD     ALLERGIES:  Allergies  Allergen Reactions   Combigan  [Brimonidine  Tartrate-Timolol ]     Redness, burning, irritation    Lisinopril  Cough   Other Itching and Rash    SUTURES-(PARTIAL HYSTERECTOMY)     SOCIAL HISTORY:  Social History   Socioeconomic History   Marital status: Married    Spouse name: ,Nicholette Barley   Number of children: 3   Years of education: GED   Highest education level: 12th grade  Occupational History   Occupation: Retired  Tobacco Use   Smoking status: Never     Passive exposure: Never   Smokeless tobacco: Never   Tobacco comments:    smoking cessation materials not required  Vaping Use   Vaping status: Never Used  Substance and Sexual Activity   Alcohol use: No   Drug use: No   Sexual activity: Not Currently  Other Topics Concern   Not on file  Social History Narrative   Not on file   Social Drivers of Health   Financial Resource Strain: Low Risk  (01/09/2023)   Received from [facility]   Overall Financial Resource Strain (CARDIA)    Difficulty of Paying Living Expenses: Not very hard  Food Insecurity: No Food Insecurity (11/14/2023)   Hunger Vital Sign    Worried About Running Out of Food in the Last Year: Never true    Ran Out of Food in the Last Year: Never true  Transportation Needs: No Transportation Needs (11/14/2023)   PRAPARE - Administrator, Civil Service (Medical): No    Lack of Transportation (Non-Medical): No  Physical Activity: Inactive (09/25/2021)   Exercise Vital Sign    Days of Exercise per Week: 0 days    Minutes of Exercise per Session: 0 min  Stress: No Stress Concern Present (09/25/2021)   [employer] of Occupational Health - Occupational Stress Questionnaire    Feeling of Stress : Not at all  Social Connections: Moderately Integrated (11/14/2023)   Social Connection and Isolation Panel [NHANES]    Frequency of Communication with Friends and Family: More than three times a week    Frequency of Social Gatherings with Friends and Family: More than three times a week    Attends Religious Services: More than 4 times per year    Active Member of [employer] or Organizations: No    Attends Banker Meetings: Never    Marital Status: Married  Catering manager Violence: Not At Risk (11/14/2023)   Humiliation, Afraid, Rape, and Kick questionnaire    Fear of Current or Ex-Partner: No    Emotionally Abused: No    Physically Abused: No    Sexually  Abused: No     FAMILY HISTORY:  Family History  Problem Relation Age of Onset   Mental illness Mother        Dementia   Stroke Father    Lung cancer Brother    Breast cancer Other        dx under 50   Brain cancer Granddaughter 12       glioblastoma      REVIEW OF SYSTEMS:  Review of Systems  Constitutional:  Negative for chills and fever.  Respiratory:  Negative for cough and shortness of breath.   Cardiovascular:  Negative for chest pain and palpitations.  Gastrointestinal:  Negative for abdominal pain, nausea and vomiting.       + Problem with Eakin pouch   Genitourinary:  Negative for dysuria and urgency.  All other systems reviewed and are negative.   VITAL SIGNS:  Temp:  [98.1 F (36.7 C)] 98.1 F (36.7 C) (06/09 1000) Pulse Rate:  [97] 97 (06/09 1001) Resp:  [18] 18 (06/09 1001) BP: (129)/(62) 129/62 (06/09 1001) SpO2:  [96 %] 96 % (06/09 1001) Weight:  [52.2 kg] 52.2 kg (06/09 1000)     Height: 5' (152.4 cm) Weight: 52.2 kg BMI (Calculated): 22.46   INTAKE/OUTPUT:  No intake/output data recorded.  PHYSICAL EXAM:  Physical Exam Vitals and nursing note reviewed. Exam conducted with a chaperone present.  Constitutional:      General: She is not in acute distress.    Appearance: Normal appearance. She is normal weight. She is not ill-appearing.  HENT:     Head: Normocephalic and atraumatic.  Eyes:     General: No scleral icterus.    Conjunctiva/sclera: Conjunctivae normal.  Cardiovascular:     Rate and Rhythm: Normal rate and regular rhythm.     Pulses: Normal pulses.  Pulmonary:     Effort: Pulmonary effort is normal. No respiratory distress.     Breath sounds: Normal breath sounds.  Abdominal:     General: A surgical scar is present. The ostomy site is clean.     Palpations: Abdomen is soft.     Tenderness: There is no abdominal tenderness.     Comments: Abdomen is soft, non-tender, non-distended, no rebound/guarding.   Colostomy to the right  abdomen, this is pink, productive of stool and gas.   Previous colostomy site is healing well, there is a small (~2 mm) area tot he medial portion  that has scant enteric appearing drainage. In the old appliance there is <10 ccs of more sanguinous appearing drainage. No frank blood or bleeding. There surrounding skin is irritated secondary to moisture. Otherwise this is healing well.   Genitourinary:    Comments: Deferred Skin:    General: Skin is warm and dry.  Neurological:     General: No focal deficit present.     Mental Status: She is alert and oriented to person, place, and time.  Psychiatric:        Mood and Affect: Mood normal.        Behavior: Behavior normal.     Previous Colostomy Site / EC Fistula (12/28/2023)   Labs:     Latest Ref Rng & Units 12/15/2023    9:00 AM 12/14/2023    8:30 AM 12/10/2023    1:21 AM  CBC  WBC 4.0 - 10.5 K/uL 13.1  14.5  11.6   Hemoglobin 12.0 - 15.0 g/dL 9.2  9.1  7.6   Hematocrit 36.0 - 46.0 % 29.0  29.5  24.2   Platelets 150 - 400 K/uL 383  366  469       Latest Ref Rng & Units 12/15/2023    9:00 AM 12/14/2023    8:30 AM 12/10/2023    6:40 AM  CMP  Glucose 70 - 99 mg/dL 295  621  308   BUN 8 - 23 mg/dL 12  13  18    Creatinine 0.44 - 1.00 mg/dL 6.57  8.46  9.62   Sodium 135 - 145 mmol/L 138  138  136   Potassium 3.5 - 5.1 mmol/L 3.3  3.5  4.2   Chloride 98 - 111 mmol/L 105  106  102   CO2 22 - 32 mmol/L 24  26  24    Calcium 8.9 - 10.3 mg/dL 7.8  7.3  8.3     Imaging studies:  No new pertinent imaging studies    Assessment/Plan: 88 y.o. female with scant sanguinous drainage from previous colostomy site otherwise doing well s/p colostomy revision with complex post-operative course.    - Suspect the small amount of more bloody appearing drainage is secondary to irritation from raw tissue at draining site for EC fistula. This is very small in quantity and there is no evidence of active bleeding currently. I do think it is reasonable  to recheck CBC to ensure no drastic change in H&H. I have reached out to [facility] RN as well to assist in replacing pouch and appliance. I do not think she needs re-imaging unless laboratory work up is concerning. She does not need surgical intervention or readmission at this time.We will be happy to follow up with her within the next week as well. All questions addressed and answered.   All of the above findings and recommendations were discussed with the patient, her husband, and the ED team, and all of their questions were answered to their expressed satisfaction.  Thank you for the opportunity to participate in this patient's care.   -- Apolonio Bay, PA-C [facility] 12/28/2023, 10:54 AM M-F: 7am - 4pm

## 2023-12-28 NOTE — Assessment & Plan Note (Deleted)
 Pathology results are reviewed and discussed with patient. 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  Benefit of adjuvant 5-FU monotherapy in stage II dMMR patients  is uncertain.  Adjuvant chemotherapy was not offered.  Initial post operative Signatera ctDNA was positive. T4b disease, s/p radiation  Dec 2024 Repeat Signatera ctDNA negative.  Dec 2024  CT scan showed No evidence of lymphadenopathy or metastatic disease in the chest, abdomen, or pelvis Continue surveillance, and follow up in 6 months. 10/06/2023  colonoscopy .

## 2023-12-28 NOTE — Consult Note (Signed)
 WOC Nurse ostomy consult note Ordered Eakin pouch change with patient and husband at the bedside.  Stoma type/location: End colostomy RLQ with 28 mm round, red and viable. Left abd has 2 leaking fistula sites, one is a pinhole opening near the current ostomy with small amount yellow drainage, and other is left outer abd located in a crease over the former ostomy site; 1 cm x 0.5 cm.  Peristomal assessment: Mucous separation on the left side, 100% yellow slough.  Redness around the fistulas.  Treatment options for stomal/peristomal skin: Barrier ring [id number] around the ostomy. Small Ruben Corolla # [id number]. Output: 60 ml of brown stool on her end colostomy. Pt was not using the device. Pat a towel on her left outer abdomen, not able to measure. Yellow/brown liquid on her fistula Ostomy pouching: 1pc./2pc.   Pt was discharge after the consult.  WOC team will not plan to follow further.  Please reconsult if further assistance is needed. Thank-you,  Rachel Budds BSN, RN, [employer], WOC  (Pager: [phone])

## 2023-12-28 NOTE — ED Triage Notes (Signed)
 Patient to ED via POV for colostomy problem. PT reports leaking from old colostomy- started this AM. Pt has new colostomy on right side.

## 2023-12-28 NOTE — Telephone Encounter (Signed)
 Pt spouse called and stated that pt will not be at appt today as she is on the ambulance and headed to the hospital now

## 2023-12-28 NOTE — ED Provider Notes (Signed)
 [facility] Provider Note    Event Date/Time   First MD Initiated Contact with Patient 12/28/23 1018     (approximate)   History   Post-op Problem   HPI  Kristin Miller is a 88 y.o. female with history of IDA, hypertension, colon cancer, colostomy, CHF and as listed in EMR presents to the emergency department for evaluation of blood oozing from original colostomy/incision site.  She had a revision on 10/27/2023 then on 11/13/2023 peristomal hernia repair and second revision of end colostomy requiring partial colectomy and resiting.  About 2 hours ago, she was getting ready for her doctor's appointment at the cancer center and noted blood oozing from the initial ostomy incision site.  There is been no bright red blood noted.  She denies new pain, no increased tenderness in the area, no nausea or vomiting.      Physical Exam   Triage Vital Signs: ED Triage Vitals  Encounter Vitals Group     BP 12/28/23 1001 129/62     Systolic BP Percentile --      Diastolic BP Percentile --      Pulse Rate 12/28/23 1001 97     Resp 12/28/23 1001 18     Temp 12/28/23 1000 98.1 F (36.7 C)     Temp src --      SpO2 12/28/23 1001 96 %     Weight 12/28/23 1000 115 lb (52.2 kg)     Height 12/28/23 1000 5' (1.524 m)     Head Circumference --      Peak Flow --      Pain Score 12/28/23 1000 0     Pain Loc --      Pain Education --      Exclude from Growth Chart --     Most recent vital signs: Vitals:   12/28/23 1000 12/28/23 1001  BP:  129/62  Pulse:  97  Resp:  18  Temp: 98.1 F (36.7 C)   SpO2:  96%    General: Awake, no distress.  CV:  Good peripheral perfusion.  Resp:  Normal effort.  Abd:  No distention.  Other:  Colostomy stoma appears healthy.  Light brown stool noted in the ostomy bag.  Eakin pouch contains scant amount of dark red fluid presumably blood and light brown fluid of a small amount.  Abdomen is soft and without obvious skin breakdown  around ostomy sites.  No pain with palpation.   ED Results / Procedures / Treatments   Labs (all labs ordered are listed, but only abnormal results are displayed) Labs Reviewed - No data to display   EKG  Not indicated.   RADIOLOGY  Image and radiology report reviewed and interpreted by me. Radiology report consistent with the same.  Not indicated.  PROCEDURES:  Critical Care performed: No  Procedures   MEDICATIONS ORDERED IN ED:  Medications - No data to display   IMPRESSION / MDM / ASSESSMENT AND PLAN / ED COURSE   I have reviewed the triage note.  Differential diagnosis includes, but is not limited to, surgical wound dehiscence, normal healing process, acute on chronic anemia, anastomosis leak.  Patient's presentation is most consistent with acute illness / injury with system symptoms.    88 year old female presenting to the emergency department due to bloody drainage from original colostomy incision.  She has an Eakin pouch overlying the wound with scant amount of dark red and light brown liquid.  Caregiver states that  they have not drained the bag since noticing the fluid.  There is approximately 15 mL of fluid collected.      FINAL CLINICAL IMPRESSION(S) / ED DIAGNOSES   Final diagnoses:  None     Rx / DC Orders   ED Discharge Orders     None        Note:  This document was prepared using Dragon voice recognition software and may include unintentional dictation errors.

## 2023-12-28 NOTE — ED Notes (Signed)
 See triage notes. Patient is a week post surgery from colostomy being moved to the right side. Patient stated she has begun leaking blood from the old site.

## 2023-12-28 NOTE — Telephone Encounter (Signed)
 Ok to r/s appt to be in approx 4 weeks

## 2023-12-29 ENCOUNTER — Encounter: Payer: Self-pay | Admitting: Family Medicine

## 2023-12-29 ENCOUNTER — Ambulatory Visit (INDEPENDENT_AMBULATORY_CARE_PROVIDER_SITE_OTHER): Admitting: Family Medicine

## 2023-12-29 VITALS — BP 114/77 | HR 104 | Temp 97.8°F | Resp 18 | Ht 60.0 in | Wt 117.0 lb

## 2023-12-29 DIAGNOSIS — R531 Weakness: Secondary | ICD-10-CM | POA: Insufficient documentation

## 2023-12-29 DIAGNOSIS — C189 Malignant neoplasm of colon, unspecified: Secondary | ICD-10-CM

## 2023-12-29 DIAGNOSIS — K9400 Colostomy complication, unspecified: Secondary | ICD-10-CM | POA: Diagnosis not present

## 2023-12-29 DIAGNOSIS — M8008XS Age-related osteoporosis with current pathological fracture, vertebra(e), sequela: Secondary | ICD-10-CM | POA: Diagnosis not present

## 2023-12-29 DIAGNOSIS — D649 Anemia, unspecified: Secondary | ICD-10-CM | POA: Diagnosis not present

## 2023-12-29 DIAGNOSIS — I10 Essential (primary) hypertension: Secondary | ICD-10-CM | POA: Diagnosis not present

## 2023-12-29 DIAGNOSIS — D619 Aplastic anemia, unspecified: Secondary | ICD-10-CM | POA: Insufficient documentation

## 2023-12-29 DIAGNOSIS — Z933 Colostomy status: Secondary | ICD-10-CM | POA: Insufficient documentation

## 2023-12-29 NOTE — Assessment & Plan Note (Signed)
 Advised to hold Fosamax  for a month while she is healing fro ostomy revisions

## 2023-12-29 NOTE — Addendum Note (Signed)
 Addended by: Neesa Knapik K on: 12/29/2023 06:39 PM   Modules accepted: Orders

## 2023-12-29 NOTE — Assessment & Plan Note (Signed)
 Hgb is 10.4.  Will check her iron  indices and Hgb when she returns in a month.  There is some iron  in Ensure.

## 2023-12-29 NOTE — Assessment & Plan Note (Signed)
 Colostomy is working and is pink and healthy appearing.

## 2023-12-29 NOTE — Progress Notes (Addendum)
 Established Patient Office Visit  Subjective   Patient ID: Kristin Miller, female    DOB: [birth year]  Age: 88 y.o. MRN: [id number]  Chief Complaint  Patient presents with   Hospitalization Follow-up   Fatigue   Ear Fullness    Ear Fullness     Delightful 88 yo woman with history of colon cancer (07/2022), s/p sigmoid colectomy and pelvic wall resection with negative margins, osteoporosis with T12 compression fracture, iron  deficiency anemia, breast cancer (ER +, PR negative, HER2/NEU no over expression) s/p lumpectomy and 5 weeks of adjuvant XRT, colonoscopy (10/06/2023, recurrence of colon cancer with large mass in descending colon), S/p left colectomy with end colostomy followed by revision of the stoma (necrosis of the original stoma), small bowel fistula, intra-abdominal abscess that was drained by IR.   Initially she had 4 drains but these were pulled last week.  She has an IT consultant the original ostomy and the fistula.  She had a long hospital stay from 11/13/2023 to 12/15/2023.  The original ostomy is draining a yellowish brown colored liquid and the Eakin bag has to be emptied about once a day.  The fistula is putting out a small amount of yellowish brown liquid   She has lost 18 pounds since February 2025.  She is eating but small amounts.  Discussed how important nutritional support is when you are trying to heal.  She is getting a can of Ensure daily but doesn't like Ensure.  Advised that she can add  ice cream to the ensure.   She went to the ER yesterday because her original ostomy site was bleeding.  The bleeding stopped and her Hemoglobin was 10.4  She did not require admission.   She complains that she is very tired.  We have home health nursing (ostomy nurse) coming out 1 time a week and PT coming out 1 time a week.  Her husband is doing the food and so far they have been eating mostly fast food,   She was taking amlodipine  5mg  and losartan -hydrochlorothiazide  100-12.5.   Her BP is 114/77.   ROS    Objective:     BP 114/77 (BP Location: Left Arm, Patient Position: Sitting)   Pulse (!) 104   Temp 97.8 F (36.6 C) (Oral)   Resp 18   Ht 5' (1.524 m)   Wt 117 lb (53.1 kg)   SpO2 94%   BMI 22.85 kg/m    Physical Exam Vitals and nursing note reviewed.  Constitutional:      Appearance: Normal appearance.  HENT:     Head: Normocephalic and atraumatic.  Eyes:     Conjunctiva/sclera: Conjunctivae normal.  Cardiovascular:     Rate and Rhythm: Normal rate and regular rhythm.  Pulmonary:     Effort: Pulmonary effort is normal.     Breath sounds: Normal breath sounds.  Abdominal:     General: Abdomen is flat. Bowel sounds are normal.     Palpations: Abdomen is soft.     Tenderness: There is no abdominal tenderness.     Comments: No pain to palpation of abdomen.  No erythema surrounding old ostomy site or new ostomy.  Fistula is about 1 cm I size  Musculoskeletal:     Right lower leg: No edema.     Left lower leg: No edema.  Skin:    General: Skin is warm and dry.  Neurological:     Mental Status: She is alert and oriented to person, place,  and time.  Psychiatric:        Mood and Affect: Mood normal.        Behavior: Behavior normal.        Thought Content: Thought content normal.        Judgment: Judgment normal.          No results found for any visits on 12/29/23.    The ASCVD Risk score (Arnett DK, et al., 2019) failed to calculate for the following reasons:   The 2019 ASCVD risk score is only valid for ages 57 to 68    Assessment & Plan:  Age-related osteoporosis with current pathol fracture of vertebra, sequela Assessment & Plan: Advised to hold Fosamax  for a month while she is healing fro ostomy revisions   Colostomy complication [facility]) Assessment & Plan: Colostomy is working and is pink and healthy appearing.     Essential hypertension Assessment & Plan: She has lost 18 pounds in 3 months.  Her BP is low.  Hold  amlodipine  5mg  for now.  Just take losartan -hydrochlorothiazide  100-12.5 for now   Malignant neoplasm of colon, unspecified part of colon [facility]) Assessment & Plan: She has recurrence of her colon cancer.  Underwent second colectomy with colostomy.  Then colostomy required a revision.  Following with Dr. Wilhelmenia Harada.   Weakness generalized Assessment & Plan: Has started Home Health and physical therapy for her.  Once a week for 9 weeks.  She is getting up and walking in the house.     Colostomy status (HCC) Assessment & Plan: Colostomy is pink and healthy appearing.  Is working.     Acute on chronic anemia Assessment & Plan: Hgb is 10.4.  Will check her iron  indices and Hgb when she returns in a month.  There is some iron  in Ensure.        Return in about 4 weeks (around 01/26/2024).    Gaetana Kawahara K Ioan Landini, MD

## 2023-12-29 NOTE — Assessment & Plan Note (Addendum)
 She has recurrence of her colon cancer.  Underwent second colectomy with colostomy.  Then colostomy required a revision.  Following with Dr. Wilhelmenia Harada.

## 2023-12-29 NOTE — Assessment & Plan Note (Signed)
 Colostomy is pink and healthy appearing.  Is working.

## 2023-12-29 NOTE — Assessment & Plan Note (Signed)
 She has lost 18 pounds in 3 months.  Her BP is low.  Hold amlodipine  5mg  for now.  Just take losartan -hydrochlorothiazide  100-12.5 for now

## 2023-12-29 NOTE — Assessment & Plan Note (Signed)
 Has started Home Health and physical therapy for her.  Once a week for 9 weeks.  She is getting up and walking in the house.

## 2023-12-31 DIAGNOSIS — D649 Anemia, unspecified: Secondary | ICD-10-CM | POA: Diagnosis not present

## 2023-12-31 DIAGNOSIS — G43909 Migraine, unspecified, not intractable, without status migrainosus: Secondary | ICD-10-CM | POA: Diagnosis not present

## 2023-12-31 DIAGNOSIS — K9403 Colostomy malfunction: Secondary | ICD-10-CM | POA: Diagnosis not present

## 2023-12-31 DIAGNOSIS — I10 Essential (primary) hypertension: Secondary | ICD-10-CM | POA: Diagnosis not present

## 2023-12-31 DIAGNOSIS — K632 Fistula of intestine: Secondary | ICD-10-CM | POA: Diagnosis not present

## 2023-12-31 DIAGNOSIS — M8580 Other specified disorders of bone density and structure, unspecified site: Secondary | ICD-10-CM | POA: Diagnosis not present

## 2023-12-31 DIAGNOSIS — Z853 Personal history of malignant neoplasm of breast: Secondary | ICD-10-CM | POA: Diagnosis not present

## 2023-12-31 DIAGNOSIS — Z8701 Personal history of pneumonia (recurrent): Secondary | ICD-10-CM | POA: Diagnosis not present

## 2023-12-31 DIAGNOSIS — K651 Peritoneal abscess: Secondary | ICD-10-CM | POA: Diagnosis not present

## 2023-12-31 DIAGNOSIS — R32 Unspecified urinary incontinence: Secondary | ICD-10-CM | POA: Diagnosis not present

## 2023-12-31 DIAGNOSIS — M1990 Unspecified osteoarthritis, unspecified site: Secondary | ICD-10-CM | POA: Diagnosis not present

## 2024-01-01 ENCOUNTER — Telehealth: Payer: Self-pay | Admitting: Family Medicine

## 2024-01-01 NOTE — Telephone Encounter (Signed)
 Copied from CRM [id number]. Topic: Clinical - Home Health Verbal Orders >> Jan 01, 2024  9:11 AM Lorenz Romano B wrote: Caller/Agency: [facility] Callback Number: [phone] Service Requested: Occupational Therapy Frequency: 1 a week for 8 weeks Any new concerns about the patient? No

## 2024-01-01 NOTE — Telephone Encounter (Signed)
 Verbal orders have been given

## 2024-01-05 DIAGNOSIS — R32 Unspecified urinary incontinence: Secondary | ICD-10-CM | POA: Diagnosis not present

## 2024-01-05 DIAGNOSIS — K632 Fistula of intestine: Secondary | ICD-10-CM | POA: Diagnosis not present

## 2024-01-05 DIAGNOSIS — M1990 Unspecified osteoarthritis, unspecified site: Secondary | ICD-10-CM | POA: Diagnosis not present

## 2024-01-05 DIAGNOSIS — G43909 Migraine, unspecified, not intractable, without status migrainosus: Secondary | ICD-10-CM | POA: Diagnosis not present

## 2024-01-05 DIAGNOSIS — D649 Anemia, unspecified: Secondary | ICD-10-CM | POA: Diagnosis not present

## 2024-01-05 DIAGNOSIS — Z853 Personal history of malignant neoplasm of breast: Secondary | ICD-10-CM | POA: Diagnosis not present

## 2024-01-05 DIAGNOSIS — I10 Essential (primary) hypertension: Secondary | ICD-10-CM | POA: Diagnosis not present

## 2024-01-05 DIAGNOSIS — K651 Peritoneal abscess: Secondary | ICD-10-CM | POA: Diagnosis not present

## 2024-01-05 DIAGNOSIS — Z8701 Personal history of pneumonia (recurrent): Secondary | ICD-10-CM | POA: Diagnosis not present

## 2024-01-05 DIAGNOSIS — M8580 Other specified disorders of bone density and structure, unspecified site: Secondary | ICD-10-CM | POA: Diagnosis not present

## 2024-01-05 DIAGNOSIS — K9403 Colostomy malfunction: Secondary | ICD-10-CM | POA: Diagnosis not present

## 2024-01-06 DIAGNOSIS — M8580 Other specified disorders of bone density and structure, unspecified site: Secondary | ICD-10-CM | POA: Diagnosis not present

## 2024-01-06 DIAGNOSIS — Z8701 Personal history of pneumonia (recurrent): Secondary | ICD-10-CM | POA: Diagnosis not present

## 2024-01-06 DIAGNOSIS — Z853 Personal history of malignant neoplasm of breast: Secondary | ICD-10-CM | POA: Diagnosis not present

## 2024-01-06 DIAGNOSIS — K632 Fistula of intestine: Secondary | ICD-10-CM | POA: Diagnosis not present

## 2024-01-06 DIAGNOSIS — M1990 Unspecified osteoarthritis, unspecified site: Secondary | ICD-10-CM | POA: Diagnosis not present

## 2024-01-06 DIAGNOSIS — K651 Peritoneal abscess: Secondary | ICD-10-CM | POA: Diagnosis not present

## 2024-01-06 DIAGNOSIS — D649 Anemia, unspecified: Secondary | ICD-10-CM | POA: Diagnosis not present

## 2024-01-06 DIAGNOSIS — K9403 Colostomy malfunction: Secondary | ICD-10-CM | POA: Diagnosis not present

## 2024-01-06 DIAGNOSIS — R32 Unspecified urinary incontinence: Secondary | ICD-10-CM | POA: Diagnosis not present

## 2024-01-06 DIAGNOSIS — I10 Essential (primary) hypertension: Secondary | ICD-10-CM | POA: Diagnosis not present

## 2024-01-06 DIAGNOSIS — G43909 Migraine, unspecified, not intractable, without status migrainosus: Secondary | ICD-10-CM | POA: Diagnosis not present

## 2024-01-11 DIAGNOSIS — M8580 Other specified disorders of bone density and structure, unspecified site: Secondary | ICD-10-CM | POA: Diagnosis not present

## 2024-01-11 DIAGNOSIS — I10 Essential (primary) hypertension: Secondary | ICD-10-CM | POA: Diagnosis not present

## 2024-01-11 DIAGNOSIS — K632 Fistula of intestine: Secondary | ICD-10-CM | POA: Diagnosis not present

## 2024-01-11 DIAGNOSIS — M1990 Unspecified osteoarthritis, unspecified site: Secondary | ICD-10-CM | POA: Diagnosis not present

## 2024-01-11 DIAGNOSIS — K651 Peritoneal abscess: Secondary | ICD-10-CM | POA: Diagnosis not present

## 2024-01-11 DIAGNOSIS — K9403 Colostomy malfunction: Secondary | ICD-10-CM | POA: Diagnosis not present

## 2024-01-11 DIAGNOSIS — Z933 Colostomy status: Secondary | ICD-10-CM | POA: Diagnosis not present

## 2024-01-11 DIAGNOSIS — Z853 Personal history of malignant neoplasm of breast: Secondary | ICD-10-CM | POA: Diagnosis not present

## 2024-01-11 DIAGNOSIS — D649 Anemia, unspecified: Secondary | ICD-10-CM | POA: Diagnosis not present

## 2024-01-11 DIAGNOSIS — L24B3 Irritant contact dermatitis related to fecal or urinary stoma or fistula: Secondary | ICD-10-CM | POA: Diagnosis not present

## 2024-01-11 DIAGNOSIS — Z8701 Personal history of pneumonia (recurrent): Secondary | ICD-10-CM | POA: Diagnosis not present

## 2024-01-11 DIAGNOSIS — G43909 Migraine, unspecified, not intractable, without status migrainosus: Secondary | ICD-10-CM | POA: Diagnosis not present

## 2024-01-11 DIAGNOSIS — R32 Unspecified urinary incontinence: Secondary | ICD-10-CM | POA: Diagnosis not present

## 2024-01-12 ENCOUNTER — Ambulatory Visit (INDEPENDENT_AMBULATORY_CARE_PROVIDER_SITE_OTHER): Admitting: Physician Assistant

## 2024-01-12 ENCOUNTER — Encounter: Payer: Self-pay | Admitting: Physician Assistant

## 2024-01-12 VITALS — BP 119/80 | HR 102 | Temp 98.7°F | Ht 60.0 in | Wt 114.0 lb

## 2024-01-12 DIAGNOSIS — Z433 Encounter for attention to colostomy: Secondary | ICD-10-CM

## 2024-01-12 DIAGNOSIS — Z09 Encounter for follow-up examination after completed treatment for conditions other than malignant neoplasm: Secondary | ICD-10-CM

## 2024-01-12 DIAGNOSIS — K651 Peritoneal abscess: Secondary | ICD-10-CM | POA: Diagnosis not present

## 2024-01-12 DIAGNOSIS — Z933 Colostomy status: Secondary | ICD-10-CM

## 2024-01-12 DIAGNOSIS — G43909 Migraine, unspecified, not intractable, without status migrainosus: Secondary | ICD-10-CM | POA: Diagnosis not present

## 2024-01-12 DIAGNOSIS — Z9049 Acquired absence of other specified parts of digestive tract: Secondary | ICD-10-CM

## 2024-01-12 DIAGNOSIS — C186 Malignant neoplasm of descending colon: Secondary | ICD-10-CM

## 2024-01-12 DIAGNOSIS — K9403 Colostomy malfunction: Secondary | ICD-10-CM | POA: Diagnosis not present

## 2024-01-12 DIAGNOSIS — M8580 Other specified disorders of bone density and structure, unspecified site: Secondary | ICD-10-CM | POA: Diagnosis not present

## 2024-01-12 DIAGNOSIS — R32 Unspecified urinary incontinence: Secondary | ICD-10-CM | POA: Diagnosis not present

## 2024-01-12 DIAGNOSIS — I10 Essential (primary) hypertension: Secondary | ICD-10-CM | POA: Diagnosis not present

## 2024-01-12 DIAGNOSIS — C189 Malignant neoplasm of colon, unspecified: Secondary | ICD-10-CM

## 2024-01-12 DIAGNOSIS — Z8701 Personal history of pneumonia (recurrent): Secondary | ICD-10-CM | POA: Diagnosis not present

## 2024-01-12 DIAGNOSIS — K9400 Colostomy complication, unspecified: Secondary | ICD-10-CM

## 2024-01-12 DIAGNOSIS — M1990 Unspecified osteoarthritis, unspecified site: Secondary | ICD-10-CM | POA: Diagnosis not present

## 2024-01-12 DIAGNOSIS — D649 Anemia, unspecified: Secondary | ICD-10-CM | POA: Diagnosis not present

## 2024-01-12 DIAGNOSIS — K632 Fistula of intestine: Secondary | ICD-10-CM | POA: Diagnosis not present

## 2024-01-12 DIAGNOSIS — Z853 Personal history of malignant neoplasm of breast: Secondary | ICD-10-CM | POA: Diagnosis not present

## 2024-01-12 NOTE — Patient Instructions (Addendum)
 Wound Care: Utilized medium Eakin Pouch Isolated colostomy and previous colostomy wound, with bridge in between.  These can be changed every 7 days if maintains a seal.  Change as needed otherwise You may shower with this in place  Contact Home Health Agency to order more supplies: [phone] is the convatec number for the medium [phone] is the convatec number for the large    We have scheduled you for a CT Scan of your Abdomen and Pelvis with contrast. [facility] . Please arrive there by 10:30am . If you need to reschedule your Scan, you may do so by calling [phone]. Please let us  know if you reschedule your scan as we have to get authorization from your insurance for this.       Please follow up with Dr Jordis on 02/01/24, in the meantime if you have any questions or concerns please don't hesitate to call the office    High-Protein and High-Calorie Diet Eating high-protein and high-calorie foods can help you to gain weight, heal after an injury, and get better after an illness or surgery. The amount of daily protein and calories you need depends on: Your body weight. The reason you were told to follow this diet. Usually, a high-protein, high-calorie diet means that you should: Eat 250-500 extra calories each day. Make sure that you get enough of your daily calories from protein. Ask your health care provider how many of your calories should come from protein and how many calories total you need each day. Follow the diet as told by your provider. What are tips for following this plan? Reading food labels Check the nutrition facts label for calories, and grams of fat and protein. Items with more than 4 grams of protein are high-protein foods. General information  Ask your provider if you should take a nutritional supplement. Try to eat six small meals each day instead of three large meals. A goal is usually to eat every 2 to 3  hours. Eat a balanced diet. In each meal, include one food that's high in protein and one food with fat in it. Keep nutritious snacks available, such as nuts, trail mixes, dried fruit, and whole-milk yogurt. If you have kidney disease or diabetes, talk with your provider about how much protein is safe for you. Too much protein may put extra stress on your kidneys. Replace zero-calorie drinks with drinks that have calories in them, such as milk and 100% fruit juice. Consider setting a timer to remind you to eat. You'll want to eat even if you do not feel very hungry. Preparing meals Milk and dairy foods. Add whole milk, half-and-half, or heavy cream to cereal, pudding, soup, or hot cocoa. Add whole milk to instant breakfast drinks. Add powdered milk to baked goods, smoothies, or milkshakes. Add powdered milk, cream, or butter to mashed potatoes. Replace water  with milk or heavy cream when making foods such as oatmeal, pudding, or cocoa. Make cream-based pastas and soups. Add cheese to cooked vegetables. Make whole-milk yogurt parfaits. Top them with granola, fruit, or nuts. Add cottage cheese to fruit. Add cream cheese to sandwiches or as a topping on crackers and bread. Eggs. Add hard-boiled eggs to salads. Keep hard-boiled eggs in the fridge to snack on. Add cheese to cooked eggs. Beans, nuts, and seeds. Add peanut butter to oatmeal or smoothies. Use peanut butter as a dip for fruits and vegetables or as a topping for pretzels, celery, or  crackers. Add beans to casseroles, dips, and spreads. Add pureed beans to sauces and soups. Salads, soups, and other foods. Add avocado, cheese, or both to sandwiches or salads. Add avocado to smoothies. Add meat, poultry, or seafood to rice, pasta, casseroles, salads, and soups. Use mayonnaise when making egg salad, chicken salad, or tuna salad. Add oil or butter to cooked vegetables and grains. What high-protein foods should I  eat?  Vegetables Soybeans. Peas. Grains Quinoa. Bulgur wheat. Buckwheat. Meats and other proteins Beef, pork, and poultry. Fish and seafood. Eggs. Tofu. Textured vegetable protein (TVP). Peanut butter. Nuts and seeds. Dried beans. Protein powders. Hummus. Jerky. Dairy Whole milk. Whole-milk yogurt. Powdered milk. Cheese. Cottage cheese. Eggnog. Beverages High-protein supplement drinks. Soy milk. Other foods Protein bars. The items listed above may not be all the foods and drinks you can have. Talk with an expert in healthy eating called a dietitian to learn more. What high-calorie foods should I eat? Fruits Dried fruit. Fruit leather. Canned fruit in syrup. Fruit juice. Avocado. Vegetables Vegetables cooked in oil or butter. Fried potatoes. Grains Pasta. Quick breads. Muffins. Pancakes. Granola. Meats and other proteins Peanut butter and other nut butters. Nuts and seeds. Dairy Heavy cream. Whipped cream. Cream cheese. Sour cream. Ice cream. Custard. Pudding. Whole-milk dairy products. Beverages Meal-replacement beverages. Nutrition shakes. Fruit juice. Seasonings and condiments Salad dressing. Mayonnaise. Alfredo sauce. Fruit preserves or jelly. Honey. Syrup. Sweets and desserts Cake. Cookies. Pie. Pastries. Candy bars. Chocolate. Fats and oils Butter or margarine. Oil. Gravy. Other foods Meal-replacement bars. The items listed above may not be all the foods and drinks you can have. Talk with an expert in healthy eating to learn more. This information is not intended to replace advice given to you by your health care provider. Make sure you discuss any questions you have with your health care provider. Document Revised: 12/01/2022 Document Reviewed: 12/01/2022 Elsevier Patient Education  2024 [employer].

## 2024-01-12 NOTE — Progress Notes (Signed)
 [facility] POST-OP OFFICE VISIT  01/12/2024  HPI: ANNA-MARIE COLLER is a 88 y.o. 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   She presents today with her husband secondary to concerns of increased drainage from previous colostomy site and difficult maintaining seal. Husband reports in the last 48 hours, he has noticed an increase in drainage from colostomy. He reports 50 ccs or so every 4-6 hours. The quality of this has not changed. She denied any fever, chills, nausea, emesis. She has a poor appetite and has lost 3 lbs since PCP visit on 06/10  Vital signs: BP 119/80   Pulse (!) 102   Temp 98.7 F (37.1 C) (Oral)   Ht 5' (1.524 m)   Wt 114 lb (51.7 kg)   SpO2 99%   BMI 22.26 kg/m    Physical Exam: Constitutional: Well appearing female, NAD Abdomen: Soft, non-tender, non-distended, no rebound/guarding. Colostomy in right central abdomen, this is pink, patent and productive, does sit in skin fold. To the left of this is previous colostomy incision which is draining sero-feculent appear fluid from multiple areas.  Wound Manager (01/12/2024)     Assessment/Plan: This is a 88 y.o. 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 I was able to get medium Eakin pouch from the hospital. I was able to cut a hole to isolate colostomy and then a long opening to encompass the previous colostomy wound and ECF drainage. I did leave a bridge between these two areas however, I suspect was may ultimate need to remove this bridge as the skin folds in the area are likely going to be prone to leaking. She may maintain  this for as long as it holds a seal up to 7 days if needed. I did leave stencil with her husband and updated instructions as well as ordering numbers for [facility]   - I will also repeat CT Abdomen/Pelvis to ensure no missed or accumulating fluid collections. One potential thought may be trying to replace gravity drain to help manage output if possible. The quantity is not overtly high according to husband.   - I do not think there is any indication for readmission at this time. She was encouraged to prioritize hydration and calories for weight gain and healing.   - We will follow up with her after CT is completed with hopes to keep her out of hospital and clinic as much as possible. She, and her husband, both understand to call at any time with questions or concerns.   -- Arthea Platt, PA-C [facility] 01/12/2024, 11:33 AM M-F: 7am - 4pm

## 2024-01-14 DIAGNOSIS — M1990 Unspecified osteoarthritis, unspecified site: Secondary | ICD-10-CM | POA: Diagnosis not present

## 2024-01-14 DIAGNOSIS — K9403 Colostomy malfunction: Secondary | ICD-10-CM | POA: Diagnosis not present

## 2024-01-14 DIAGNOSIS — R32 Unspecified urinary incontinence: Secondary | ICD-10-CM | POA: Diagnosis not present

## 2024-01-14 DIAGNOSIS — Z853 Personal history of malignant neoplasm of breast: Secondary | ICD-10-CM | POA: Diagnosis not present

## 2024-01-14 DIAGNOSIS — K651 Peritoneal abscess: Secondary | ICD-10-CM | POA: Diagnosis not present

## 2024-01-14 DIAGNOSIS — Z8701 Personal history of pneumonia (recurrent): Secondary | ICD-10-CM | POA: Diagnosis not present

## 2024-01-14 DIAGNOSIS — G43909 Migraine, unspecified, not intractable, without status migrainosus: Secondary | ICD-10-CM | POA: Diagnosis not present

## 2024-01-14 DIAGNOSIS — I10 Essential (primary) hypertension: Secondary | ICD-10-CM | POA: Diagnosis not present

## 2024-01-14 DIAGNOSIS — D649 Anemia, unspecified: Secondary | ICD-10-CM | POA: Diagnosis not present

## 2024-01-14 DIAGNOSIS — K632 Fistula of intestine: Secondary | ICD-10-CM | POA: Diagnosis not present

## 2024-01-14 DIAGNOSIS — M8580 Other specified disorders of bone density and structure, unspecified site: Secondary | ICD-10-CM | POA: Diagnosis not present

## 2024-01-18 ENCOUNTER — Ambulatory Visit
Admission: RE | Admit: 2024-01-18 | Discharge: 2024-01-18 | Disposition: A | Discharge status: Home / Self Care | Source: Ambulatory Visit | Attending: Physician Assistant | Admitting: Physician Assistant

## 2024-01-18 ENCOUNTER — Ambulatory Visit: Payer: Medicare HMO

## 2024-01-18 DIAGNOSIS — Z933 Colostomy status: Secondary | ICD-10-CM | POA: Insufficient documentation

## 2024-01-18 DIAGNOSIS — C189 Malignant neoplasm of colon, unspecified: Secondary | ICD-10-CM | POA: Insufficient documentation

## 2024-01-18 DIAGNOSIS — Z9049 Acquired absence of other specified parts of digestive tract: Secondary | ICD-10-CM | POA: Diagnosis not present

## 2024-01-18 DIAGNOSIS — N281 Cyst of kidney, acquired: Secondary | ICD-10-CM | POA: Diagnosis not present

## 2024-01-18 DIAGNOSIS — K8689 Other specified diseases of pancreas: Secondary | ICD-10-CM | POA: Diagnosis not present

## 2024-01-18 DIAGNOSIS — K9400 Colostomy complication, unspecified: Secondary | ICD-10-CM | POA: Diagnosis not present

## 2024-01-18 DIAGNOSIS — K449 Diaphragmatic hernia 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 gangrene: Secondary | ICD-10-CM | POA: Diagnosis not present

## 2024-01-18 MED ORDER — IOHEXOL 300 MG/ML  SOLN
80.0000 mL | Freq: Once | INTRAMUSCULAR | Status: AC | PRN
  Administered 2024-01-18: 80 mL via INTRAVENOUS

## 2024-01-18 MED ORDER — IOHEXOL 9 MG/ML PO SOLN
500.0000 mL | ORAL | Status: AC
  Administered 2024-01-18 (×2): 500 mL via ORAL

## 2024-01-19 DIAGNOSIS — R32 Unspecified urinary incontinence: Secondary | ICD-10-CM | POA: Diagnosis not present

## 2024-01-19 DIAGNOSIS — I10 Essential (primary) hypertension: Secondary | ICD-10-CM | POA: Diagnosis not present

## 2024-01-19 DIAGNOSIS — K9403 Colostomy malfunction: Secondary | ICD-10-CM | POA: Diagnosis not present

## 2024-01-19 DIAGNOSIS — M1990 Unspecified osteoarthritis, unspecified site: Secondary | ICD-10-CM | POA: Diagnosis not present

## 2024-01-19 DIAGNOSIS — G43909 Migraine, unspecified, not intractable, without status migrainosus: Secondary | ICD-10-CM | POA: Diagnosis not present

## 2024-01-19 DIAGNOSIS — M8580 Other specified disorders of bone density and structure, unspecified site: Secondary | ICD-10-CM | POA: Diagnosis not present

## 2024-01-19 DIAGNOSIS — Z8701 Personal history of pneumonia (recurrent): Secondary | ICD-10-CM | POA: Diagnosis not present

## 2024-01-19 DIAGNOSIS — D649 Anemia, unspecified: Secondary | ICD-10-CM | POA: Diagnosis not present

## 2024-01-19 DIAGNOSIS — Z853 Personal history of malignant neoplasm of breast: Secondary | ICD-10-CM | POA: Diagnosis not present

## 2024-01-19 DIAGNOSIS — K651 Peritoneal abscess: Secondary | ICD-10-CM | POA: Diagnosis not present

## 2024-01-19 DIAGNOSIS — K632 Fistula of intestine: Secondary | ICD-10-CM | POA: Diagnosis not present

## 2024-01-20 DIAGNOSIS — K9403 Colostomy malfunction: Secondary | ICD-10-CM | POA: Diagnosis not present

## 2024-01-20 DIAGNOSIS — K651 Peritoneal abscess: Secondary | ICD-10-CM | POA: Diagnosis not present

## 2024-01-20 DIAGNOSIS — Z933 Colostomy status: Secondary | ICD-10-CM | POA: Diagnosis not present

## 2024-01-20 DIAGNOSIS — D649 Anemia, unspecified: Secondary | ICD-10-CM | POA: Diagnosis not present

## 2024-01-20 DIAGNOSIS — M8580 Other specified disorders of bone density and structure, unspecified site: Secondary | ICD-10-CM | POA: Diagnosis not present

## 2024-01-20 DIAGNOSIS — M1990 Unspecified osteoarthritis, unspecified site: Secondary | ICD-10-CM | POA: Diagnosis not present

## 2024-01-20 DIAGNOSIS — K632 Fistula of intestine: Secondary | ICD-10-CM | POA: Diagnosis not present

## 2024-01-20 DIAGNOSIS — I10 Essential (primary) hypertension: Secondary | ICD-10-CM | POA: Diagnosis not present

## 2024-01-20 DIAGNOSIS — Z853 Personal history of malignant neoplasm of breast: Secondary | ICD-10-CM | POA: Diagnosis not present

## 2024-01-20 DIAGNOSIS — G43909 Migraine, unspecified, not intractable, without status migrainosus: Secondary | ICD-10-CM | POA: Diagnosis not present

## 2024-01-20 DIAGNOSIS — L24B3 Irritant contact dermatitis related to fecal or urinary stoma or fistula: Secondary | ICD-10-CM | POA: Diagnosis not present

## 2024-01-20 DIAGNOSIS — R32 Unspecified urinary incontinence: Secondary | ICD-10-CM | POA: Diagnosis not present

## 2024-01-20 DIAGNOSIS — Z8701 Personal history of pneumonia (recurrent): Secondary | ICD-10-CM | POA: Diagnosis not present

## 2024-01-25 ENCOUNTER — Other Ambulatory Visit: Payer: Medicare HMO

## 2024-01-25 ENCOUNTER — Ambulatory Visit: Payer: Medicare HMO | Admitting: Oncology

## 2024-01-26 ENCOUNTER — Telehealth: Payer: Self-pay | Admitting: Surgery

## 2024-01-26 ENCOUNTER — Inpatient Hospital Stay: Attending: Oncology

## 2024-01-26 ENCOUNTER — Inpatient Hospital Stay: Admitting: Oncology

## 2024-01-26 ENCOUNTER — Inpatient Hospital Stay

## 2024-01-26 ENCOUNTER — Encounter: Payer: Self-pay | Admitting: Oncology

## 2024-01-26 VITALS — BP 146/76 | HR 95 | Temp 97.8°F | Resp 18 | Wt 113.6 lb

## 2024-01-26 DIAGNOSIS — R7989 Other specified abnormal findings of blood chemistry: Secondary | ICD-10-CM | POA: Insufficient documentation

## 2024-01-26 DIAGNOSIS — Z1509 Genetic susceptibility to other malignant neoplasm: Secondary | ICD-10-CM | POA: Insufficient documentation

## 2024-01-26 DIAGNOSIS — C186 Malignant neoplasm of descending colon: Secondary | ICD-10-CM | POA: Diagnosis not present

## 2024-01-26 DIAGNOSIS — D649 Anemia, unspecified: Secondary | ICD-10-CM | POA: Diagnosis not present

## 2024-01-26 DIAGNOSIS — C50511 Malignant neoplasm of lower-outer quadrant of right female breast: Secondary | ICD-10-CM | POA: Diagnosis not present

## 2024-01-26 DIAGNOSIS — K9403 Colostomy malfunction: Secondary | ICD-10-CM | POA: Diagnosis not present

## 2024-01-26 DIAGNOSIS — D508 Other iron deficiency anemias: Secondary | ICD-10-CM

## 2024-01-26 DIAGNOSIS — C189 Malignant neoplasm of colon, unspecified: Secondary | ICD-10-CM

## 2024-01-26 DIAGNOSIS — Z1589 Genetic susceptibility to other disease: Secondary | ICD-10-CM

## 2024-01-26 DIAGNOSIS — Z8701 Personal history of pneumonia (recurrent): Secondary | ICD-10-CM | POA: Diagnosis not present

## 2024-01-26 DIAGNOSIS — R32 Unspecified urinary incontinence: Secondary | ICD-10-CM | POA: Diagnosis not present

## 2024-01-26 DIAGNOSIS — G43909 Migraine, unspecified, not intractable, without status migrainosus: Secondary | ICD-10-CM | POA: Diagnosis not present

## 2024-01-26 DIAGNOSIS — M8580 Other specified disorders of bone density and structure, unspecified site: Secondary | ICD-10-CM | POA: Diagnosis not present

## 2024-01-26 DIAGNOSIS — Z923 Personal history of irradiation: Secondary | ICD-10-CM | POA: Diagnosis not present

## 2024-01-26 DIAGNOSIS — I10 Essential (primary) hypertension: Secondary | ICD-10-CM | POA: Diagnosis not present

## 2024-01-26 DIAGNOSIS — M1990 Unspecified osteoarthritis, unspecified site: Secondary | ICD-10-CM | POA: Diagnosis not present

## 2024-01-26 DIAGNOSIS — K651 Peritoneal abscess: Secondary | ICD-10-CM | POA: Diagnosis not present

## 2024-01-26 DIAGNOSIS — Z853 Personal history of malignant neoplasm of breast: Secondary | ICD-10-CM | POA: Diagnosis not present

## 2024-01-26 DIAGNOSIS — D509 Iron deficiency anemia, unspecified: Secondary | ICD-10-CM | POA: Diagnosis not present

## 2024-01-26 DIAGNOSIS — K632 Fistula of intestine: Secondary | ICD-10-CM | POA: Diagnosis not present

## 2024-01-26 LAB — CMP (CANCER CENTER ONLY)
ALT: 12 U/L (ref 0–44)
AST: 20 U/L (ref 15–41)
Albumin: 3.1 g/dL — ABNORMAL LOW (ref 3.5–5.0)
Alkaline Phosphatase: 90 U/L (ref 38–126)
Anion gap: 12 (ref 5–15)
BUN: 18 mg/dL (ref 8–23)
CO2: 25 mmol/L (ref 22–32)
Calcium: 8.7 mg/dL — ABNORMAL LOW (ref 8.9–10.3)
Chloride: 98 mmol/L (ref 98–111)
Creatinine: 1.27 mg/dL — ABNORMAL HIGH (ref 0.44–1.00)
GFR, Estimated: 41 mL/min — ABNORMAL LOW (ref >60)
Glucose, Bld: 140 mg/dL — ABNORMAL HIGH (ref 70–99)
Potassium: 3.5 mmol/L (ref 3.5–5.1)
Sodium: 135 mmol/L (ref 135–145)
Total Bilirubin: 0.5 mg/dL (ref 0.0–1.2)
Total Protein: 7.5 g/dL (ref 6.5–8.1)

## 2024-01-26 LAB — CBC WITH DIFFERENTIAL (CANCER CENTER ONLY)
Abs Immature Granulocytes: 0.07 K/uL (ref 0.00–0.07)
Basophils Absolute: 0.1 K/uL (ref 0.0–0.1)
Basophils Relative: 1 %
Eosinophils Absolute: 0 K/uL (ref 0.0–0.5)
Eosinophils Relative: 0 %
HCT: 35.8 % — ABNORMAL LOW (ref 36.0–46.0)
Hemoglobin: 11.2 g/dL — ABNORMAL LOW (ref 12.0–15.0)
Immature Granulocytes: 1 %
Lymphocytes Relative: 19 %
Lymphs Abs: 2.3 K/uL (ref 0.7–4.0)
MCH: 27.5 pg (ref 26.0–34.0)
MCHC: 31.3 g/dL (ref 30.0–36.0)
MCV: 88 fL (ref 80.0–100.0)
Monocytes Absolute: 0.9 K/uL (ref 0.1–1.0)
Monocytes Relative: 8 %
Neutro Abs: 8.5 K/uL — ABNORMAL HIGH (ref 1.7–7.7)
Neutrophils Relative %: 71 %
Platelet Count: 370 K/uL (ref 150–400)
RBC: 4.07 MIL/uL (ref 3.87–5.11)
RDW: 16 % — ABNORMAL HIGH (ref 11.5–15.5)
WBC Count: 11.9 K/uL — ABNORMAL HIGH (ref 4.0–10.5)
nRBC: 0 % (ref 0.0–0.2)

## 2024-01-26 LAB — GENETIC SCREENING ORDER

## 2024-01-26 MED ORDER — SODIUM CHLORIDE 0.9% FLUSH
10.0000 mL | Freq: Once | INTRAVENOUS | Status: AC
  Administered 2024-01-26: 10 mL via INTRAVENOUS
  Filled 2024-01-26: qty 10

## 2024-01-26 MED ORDER — SODIUM CHLORIDE 0.9 % IV SOLN
Freq: Once | INTRAVENOUS | Status: AC
  Filled 2024-01-26: qty 250

## 2024-01-26 NOTE — Assessment & Plan Note (Signed)
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  Increased risk of developing colon cancer, ovarian cancer, endometrial cancer, etc.   Patient has discussed genetic testing results with genetic counselor, and she knows the recommendation of first-degree relatives to be screened. Patient has had partial hysterectomy, ? One ovary left. She will get surveillance CT scan for colon cancer.  Continue annual screening mammogram.

## 2024-01-26 NOTE — Assessment & Plan Note (Signed)
 Likely due to recent IV contrast study.  Patient gets IV fluid 500 cc normal saline today.  Encourage oral hydration

## 2024-01-26 NOTE — Assessment & Plan Note (Addendum)
 Pathology results are reviewed and discussed with patient. 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 Benefit of adjuvant 5-FU monotherapy in stage II dMMR patients  is uncertain. Adjuvant chemotherapy was not offered.  Initial post operative Signatera ctDNA was positive. T4b disease, s/p radiation  Dec 2024 Repeat Signatera ctDNA negative.   pT2N0 synchronous colon cancer initially found on surveillance colonoscopy status post colon resection.  Stage I, no need for adjuvant chemotherapy.  Continue close surveillance with CT images and colonoscopy.

## 2024-01-26 NOTE — Telephone Encounter (Signed)
 Patient had  CT scan and have not heard regarding the results yet, but they are able to see report in their MyChart. Please call patient to advise of next steps.

## 2024-01-26 NOTE — Progress Notes (Signed)
 Hematology/Oncology Progress note [phone] [phone]       CHIEF COMPLAINTS/PURPOSE OF CONSULTATION:  Descending colon cancer, Lynch syndrome  ASSESSMENT & PLAN:   Cancer Staging  Colon adenocarcinoma [facility]) Staging form: Colon and Rectum, AJCC 8th Edition - Clinical stage from 06/27/2022: Stage IIC (cT4b, cN0, cM0) - Signed by Babara Call, MD on 07/05/2022 - Pathologic stage from 07/29/2022: Stage IIC (pT4b, pN0, cM0) - Signed by Babara Call, MD on 10/03/2022   Colon adenocarcinoma [facility]) Pathology results are reviewed and discussed with patient. 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 Benefit of adjuvant 5-FU monotherapy in stage II dMMR patients  is uncertain. Adjuvant chemotherapy was not offered.  Initial post operative Signatera ctDNA was positive. T4b disease, s/p radiation  Dec 2024 Repeat Signatera ctDNA negative.   pT2N0 synchronous colon cancer initially found on surveillance colonoscopy status post colon resection.  Stage I, no need for adjuvant chemotherapy.  Continue close surveillance with CT images and colonoscopy.  Iron  deficiency anemia Previously s/p Venofer  treatments.  Lab Results  Component Value Date   HGB 11.2 (L) 01/26/2024   TIBC 140 (L) 11/30/2023   IRONPCTSAT 11 11/30/2023   FERRITIN 1,120 (H) 11/30/2023    Her hemoglobin has improved compared to the level at the time of discharge.  Monitor.  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  Increased risk of developing colon cancer, ovarian cancer, endometrial cancer, etc.   Patient has discussed genetic testing results with genetic counselor, and she knows the recommendation of first-degree relatives to be screened. Patient has had partial hysterectomy, ? One ovary left. She will get surveillance CT scan for colon cancer.  Continue annual screening mammogram.    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 Declined radiation therapy.  She finished 6 years of Letrozole  ad now is off treatment.  previously managed by Dr. Dessa.  Annual screening mammogram-next due in January 2026  Elevated serum creatinine Likely due to recent IV contrast study.  Patient gets IV fluid 500 cc normal saline today.  Encourage oral hydration  Orders Placed This Encounter  Procedures   CMP (Cancer Center only)    Standing Status:   Future    Expected Date:   04/27/2024    Expiration Date:   07/26/2024   CBC with Differential (Cancer Center Only)    Standing Status:   Future    Expected Date:   04/27/2024    Expiration Date:   07/26/2024   CEA    Standing Status:   Future    Expected Date:   04/27/2024    Expiration Date:   07/26/2024   Follow up in 3 months.  All questions were answered. The patient knows to call the clinic with any problems, questions or concerns.  Call Babara, MD, PhD [facility] Hematology Oncology 01/26/2024   HISTORY OF PRESENTING ILLNESS:  JULLIANA WHITMYER 88 y.o. female presents to establish care for descending colon cancer I have reviewed her chart and materials related to her cancer extensively and collaborated history with the patient. Summary of oncologic history is as follows: Oncology History  Colon adenocarcinoma (HCC)  06/27/2022 Cancer Staging   Staging form: Colon and Rectum, AJCC 8th Edition - Clinical stage from 06/27/2022: Stage IIC (cT4b, cN0, cM0) - Signed by Babara Call, MD on 07/05/2022  Stage prefix: Initial diagnosis Total positive nodes: 0   06/27/2022 Initial Diagnosis   Cancer of descending colon  -Patient was found to have iron  deficiency anemia -06/27/2022 colonoscopy showed fungating partially obstructing large mass found in the descending colon.  The mass was circumferential, oozing was present. Biopsy showed invasive adenocarcinoma. -06/27/2022, EGD  showed large hiatal hernia, esophagus plaque was found, suspicious for candidiasis.  Cells for cytology obtained.  Normal stomach and examined duodenum. Esophagus pression showed squamous epithelial cells admixed with mixed inflammatory cells and numerous fungal organisms, morphologically consistent with Candida species.  -Dr. Jinny has prescribed a course of Diflucan .     07/03/2022 Imaging   CT chest abdomen pelvis with contrast 1. Large irregular annular locally advanced colonic mass centered in the descending colon measuring up to 8.7 x 6.9 x 8.8 cm, with invasion of the pericolonic fat and direct tumor invasion of portions of the left iliopsoas muscle at the level of the superior iliac crest and direct invasion of the left lateral flank muscle wall. Findings are compatible with primary colonic malignancy. No bowel obstruction. 2. No lymphadenopathy or other definite findings of metastatic disease. 3. Two scattered subcentimeter hypodense liver lesions, too small to accurately characterize, indeterminate. Indeterminate left adrenal 1.3 cm nodule. Further evaluation with MRI abdomen without and with IV contrast may be obtained at this time, as clinically warranted. 4. Two scattered tiny solid left pulmonary nodules, largest 0.3 cm, indeterminate. Recommend attention on follow-up chest CT in 3 months. 5. Large hiatal hernia. Mildly patulous lower thoracic esophagus with small amount of layering oral contrast, suggesting esophageal dysmotility and/or gastroesophageal reflux. 6. Chronic severe T12 vertebral compression fracture. 7. Aberrant nonaneurysmal right subclavian artery. 8.  Aortic Atherosclerosis (ICD10-I70.0).   07/04/2022 Tumor Marker   CEA 9.3   07/10/2022 Imaging   MRI abdomen w wo contrast 1. 11 mm LEFT adrenal lesion is most consistent with an adenoma.Based on current clinical literature, biochemical evaluation to exclude possible functioning adrenal nodule is suggested if not  already performed.  2. Locally advanced colonic neoplasm involving musculature in the LEFT lower quadrant including LEFT iliac and oblique insertion sites along the LEFT iliac fossa better demonstrated on recent CT imaging of the chest, abdomen and pelvis. 3. Hepatic cysts.4. Large hiatal hernia, nearly the entire stomach is herniated into the chest.   07/29/2022 Surgery   Patient went left partial colectomy and pelvic wall resection.  Pathology showed A: Colon and abdominal wall, left partial colectomy with resection Invasive colorectal adenocarcinoma, perforated, with associated pericolonic abscess and involving psoas muscle.  Inflammatory response involving left fallopian tube and ovary.  Simple ovarian cyst, tattoo ink, left ptosis and hyperplastic polyps  B.  Pelvic wall resection, Focal invasive colorectal adenocarcinoma involving fibroadipose tissue with abscess and inflamed granulation tissue.  Histology grade: G2, moderately differentiated, no LVI, no perineural invasion, tumor budding score low [0-4] All margins negative for invasive carcinoma, high-grade dysplasia/intramucosal carcinoma and low-grade dysplastic. 32 regional lymph nodes were all negative for tumor involvement.  No tumor deposits.     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 The remainder of testing was normal. The report date is 08/24/2022.  The Multi-Cancer + RNA Panel offered by [facility] includes sequencing and/or deletion/duplication analysis of the following 70 genes:  AIP*, ALK, APC*, ATM*, AXIN2*, BAP1*, BARD1*, BLM*, BMPR1A*, BRCA1*, BRCA2*, BRIP1*, CDC73*, CDH1*, CDK4, CDKN1B*, CDKN2A, CHEK2*, CTNNA1*, DICER1*, EPCAM, EGFR, FH*, FLCN*, GREM1,  HOXB13, KIT, LZTR1, MAX*, MBD4, MEN1*, MET, MITF, MLH1*, MSH2*, MSH3*, MSH6*, MUTYH*, NF1*, NF2*, NTHL1*, PALB2*, PDGFRA, PMS2*, POLD1*, POLE*, POT1*, PRKAR1A*, PTCH1*, PTEN*,  RAD51C*, RAD51D*, RB1*, RET, SDHA*, SDHAF2*, SDHB*, SDHC*, SDHD*, SMAD4*, SMARCA4*, SMARCB1*, SMARCE1*, STK11*, SUFU*, TMEM127*, TP53*, TSC1*, TSC2*, VHL*. RNA analysis is performed for * genes.   07/29/2022 Cancer Staging   Staging form: Colon and Rectum, AJCC 8th Edition - Pathologic stage from 07/29/2022: Stage IIC (pT4b, pN0, cM0) - Signed by Babara Call, MD on 10/03/2022 Stage prefix: Initial diagnosis Total positive nodes: 0   09/25/2022 Miscellaneous   Signatera positive, MTM readout 0.06   10/27/2023 Surgery   10/06/2023, repeat colonoscopy showed likely malignant tumor at the surgical stoma.  Biopsied.  4 mm polyp in the sigmoid colon.  Removed with a cold snare.  Complete resection. Pathology showed invasive colorectal adenocarcinoma, moderately differentiated  Patient is status post segmental resection for tumor  Pathology showed 1. Omentum, resection for tumor, greater mass :      - OMENTUM SHOWING FAT NECROSIS WITH ASSOCIATED FIBROSIS AND INFLAMMATION      - NO EVIDENCE OF MALIGNANCY       2. Colon, segmental resection for tumor, left :      - INVASIVE MODERATELY DIFFERENTIATED ADENOCARCINOMA, 2.6 CM      - CARCINOMA INVADES INTO MUSCULARIS PROPRIA; DEFINITE INVASION INTO SUBSEROSAL      IS NOT IDENTIFIED      - RESECTION MARGINS ARE NEGATIVE FOR CARCINOMA      - NEGATIVE FOR LYMPHOVASCULAR OR PERINEURAL INVASION      - SIXTEEN BENIGN LYMPH NODES (0/16)      - SEE ONCOLOGY TABLE       Diagnosis Note :      ONCOLOGY TABLE:      COLON AND RECTUM, CARCINOMA:  Resection, Including Transanal Disk Excision of      Rectal Neoplasms      Procedure: Colon segmental resection      Tumor Site: Left colon      Tumor Size: 2.6 cm      Macroscopic Tumor Perforation: Not identified      Histologic Type: Adenocarcinoma      Histologic Grade: G2: Moderately differentiated      Multiple Primary Sites: Not applicable      Tumor Extension: Carcinoma invades into muscularis propria       Lymphovascular Invasion: Not identified      Perineural Invasion: Not identified      Treatment Effect: No known presurgical therapy      Margins:      Margin Status for Invasive Carcinoma: All margins negative for invasive      carcinoma      Margin Status for Non-Invasive Tumor: All margins negative for high-grade      dysplasia / intramucosal carcinoma and low-grade dysplasia      Regional Lymph Nodes:      Number of Lymph Nodes with Tumor: 0      Number of Lymph Nodes Examined: 16      Tumor Deposits: Not identified      Distant Metastasis:      Distant Site(s) Involved: Not applicable      Pathologic Stage Classification (pTNM, AJCC 8th Edition): pT2, pN0 ,  loss of PMS2.    Patient denies unintentional weight loss, blood in his stool, abdominal pain, nausea vomiting. She denies any constipation or difficulty passing bowel movements.  Her stool is dark, she attributes to taking oral iron  supplementation.  Family history positive for brother with lung cancer.    INTERVAL HISTORY YAMNA MACKEL is a 88 y.o. female who has above history reviewed by me today presents for follow up visit for Stage II T4b N0 dMMR colon cancer, s/p surgical resection and radiation.   Developed synchronous stage I colon cancer status post segmental colon resection on 10/27/2023.  2025. 11/13/2023 Patient underwent colostomy revision and parastomal hernia repair.  Postoperatively, she developed anterior abdominal fistula which requires additional drainage placement on 11/24/2023 and 12/11/2023.  She required TPN and was weaned off.  She also was treated with antibiotics for drainage from colostomy incision. Today patient presents for follow-up.  She continues to have some drainage from previous colostomy sites.  She follows up with surgery.  01/18/2024, she had a CT scan done.  MEDICAL HISTORY:  Past Medical History:  Diagnosis Date   Allergy    Anemia    H/O AS A CHILD   Arthritis    Back pain    Breast  cancer of upper-outer quadrant of right female breast (HCC) 06/12/2015   4 millimeter, T1a, N0; ER 90%, PR 0%, HER-2/neu not overexpressed.  Declined radiation therapy.   Collar bone fracture 05/28/2013   Fractured Collar bone on left   Dyspnea    Dysrhythmia    h/o heart skipping beat years ago   Frequent headaches    h/o migraines   Hypertension    Osteopenia 04/2018   Bone density without significant interval change since 2017.   Pneumonia     SURGICAL HISTORY: Past Surgical History:  Procedure Laterality Date   ABDOMINAL HYSTERECTOMY  1970   Partial   BREAST BIOPSY Right 05/28/2015   INVASIVE MAMMARY CARCINOMA    BREAST BIOPSY Right 06/12/2015   Wide excision, sentinel lymph node biopsy.   BREAST EXCISIONAL BIOPSY Right    BREAST LUMPECTOMY WITH AXILLARY LYMPH NODE DISSECTION Right 06/12/2015   4 mm, T1a,N0 withDCIS with clear margins. ER: 90%, PR 0%; Her 2 neu not overexpressed.    CATARACT EXTRACTION W/PHACO Left 02/03/2017   Procedure: CATARACT EXTRACTION PHACO AND INTRAOCULAR LENS PLACEMENT (IOC);  Surgeon: Jaye Fallow, MD;  Location: [facility];  Service: Ophthalmology;  Laterality: Left;  US   00:28 AP% 16.3 CDE 4.68 Fluid pack lot # [id number]   CATARACT EXTRACTION W/PHACO Right 03/01/2019   Procedure: CATARACT EXTRACTION PHACO AND INTRAOCULAR LENS PLACEMENT (IOC)  RIGHT;  Surgeon: Jaye Fallow, MD;  Location: [facility];  Service: Ophthalmology;  Laterality: Right;   COLONOSCOPY     COLONOSCOPY WITH PROPOFOL  N/A 06/27/2022   Procedure: COLONOSCOPY WITH PROPOFOL ;  Surgeon: Jinny Carmine, MD;  Location: [facility];  Service: Endoscopy;  Laterality: N/A;   COLONOSCOPY WITH PROPOFOL  N/A 10/06/2023   Procedure: COLONOSCOPY WITH PROPOFOL ;  Surgeon: Jinny Carmine, MD;  Location: [facility];  Service: Endoscopy;  Laterality: N/A;   COLOSTOMY REVISION N/A 11/13/2023   Procedure: REVISION, COLOSTOMY;  Surgeon: Jordis Laneta FALCON, MD;  Location: [facility];   Service: General;  Laterality: N/A;   ESOPHAGOGASTRODUODENOSCOPY (EGD) WITH PROPOFOL  N/A 06/27/2022   Procedure: ESOPHAGOGASTRODUODENOSCOPY (EGD) WITH PROPOFOL ;  Surgeon: Jinny Carmine, MD;  Location: [facility];  Service: Endoscopy;  Laterality: N/A;   PARTIAL COLECTOMY N/A 07/29/2022   Procedure: PARTIAL COLECTOMY, open left, RNFA to assist;  Surgeon: Jordis Laneta FALCON, MD;  Location: [facility];  Service: General;  Laterality: N/A;   POLYPECTOMY  10/06/2023   Procedure: POLYPECTOMY;  Surgeon: Jinny Carmine, MD;  Location: [facility] ENDOSCOPY;  Service: Endoscopy;;    SOCIAL HISTORY: Social History   Socioeconomic History   Marital status: Married    Spouse name: ,Maranda   Number of children: 3   Years of education: GED   Highest education level: 12th grade  Occupational History   Occupation: Retired  Tobacco Use   Smoking status: Never    Passive exposure: Never   Smokeless tobacco: Never   Tobacco comments:    smoking cessation materials not required  Vaping Use   Vaping status: Never Used  Substance and Sexual Activity   Alcohol use: No   Drug use: No   Sexual activity: Not Currently  Other Topics Concern   Not on file  Social History Narrative   Not on file   Social Drivers of Health   Financial Resource Strain: Low Risk  (01/09/2023)   Received from [facility]   Overall Financial Resource Strain (CARDIA)    Difficulty of Paying Living Expenses: Not very hard  Food Insecurity: No Food Insecurity (11/14/2023)   Hunger Vital Sign    Worried About Running Out of Food in the Last Year: Never true    Ran Out of Food in the Last Year: Never true  Transportation Needs: No Transportation Needs (11/14/2023)   PRAPARE - Administrator, Civil Service (Medical): No    Lack of Transportation (Non-Medical): No  Physical Activity: Inactive (09/25/2021)   Exercise Vital Sign    Days of Exercise per Week: 0 days    Minutes of Exercise per Session: 0 min   Stress: No Stress Concern Present (09/25/2021)   [employer] of Occupational Health - Occupational Stress Questionnaire    Feeling of Stress : Not at all  Social Connections: Moderately Integrated (11/14/2023)   Social Connection and Isolation Panel    Frequency of Communication with Friends and Family: More than three times a week    Frequency of Social Gatherings with Friends and Family: More than three times a week    Attends Religious Services: More than 4 times per year    Active Member of [employer] or Organizations: No    Attends Banker Meetings: Never    Marital Status: Married  Catering manager Violence: Not At Risk (11/14/2023)   Humiliation, Afraid, Rape, and Kick questionnaire    Fear of Current or Ex-Partner: No    Emotionally Abused: No    Physically Abused: No    Sexually Abused: No    FAMILY HISTORY: Family History  Problem Relation Age of Onset   Mental illness Mother        Dementia   Stroke Father    Lung cancer Brother    Breast cancer Other        dx under 50   Brain cancer Granddaughter 12       glioblastoma    ALLERGIES:  is allergic to combigan  [brimonidine  tartrate-timolol ], lisinopril , and other.  MEDICATIONS:  Current Outpatient Medications  Medication Sig Dispense Refill   acetaminophen  (TYLENOL ) 500 MG tablet Take 500 mg by mouth every 6 (six) hours as needed for pain.     alendronate  (FOSAMAX ) 70 MG tablet Take 1 tablet (70 mg total) by mouth every 7 (seven) days for 48 doses. Take with a full glass of water  on an empty stomach. 4 tablet 5   amLODipine  (NORVASC ) 5 MG tablet Take 1 tablet (5 mg total) by mouth daily. 90 tablet 1   latanoprost  (XALATAN ) 0.005 % ophthalmic solution 1 drop  at bedtime. Eye doctor both eyes     losartan -hydrochlorothiazide  (HYZAAR) 100-12.5 MG tablet Take 1 tablet by mouth daily. 90 tablet 1   Zinc 50 MG TABS Take 1 tablet by mouth daily at 6 (six) AM.     Multiple Minerals-Vitamins (CAL MAG ZINC +D3 PO)  Take 1 tablet by mouth daily. (Patient not taking: Reported on 01/26/2024)     No current facility-administered medications for this visit.    Review of Systems  Constitutional:  Positive for fatigue. Negative for appetite change, chills and fever.  HENT:   Negative for hearing loss and voice change.   Eyes:  Negative for eye problems.  Respiratory:  Negative for chest tightness and cough.   Cardiovascular:  Negative for chest pain.  Gastrointestinal:  Negative for abdominal distention, abdominal pain and blood in stool.  Endocrine: Negative for hot flashes.  Genitourinary:  Negative for difficulty urinating and frequency.   Musculoskeletal:  Negative for arthralgias.  Skin:  Negative for itching and rash.  Neurological:  Negative for extremity weakness.  Hematological:  Negative for adenopathy.  Psychiatric/Behavioral:  Negative for confusion.      PHYSICAL EXAMINATION: ECOG PERFORMANCE STATUS: 1 - Symptomatic but completely ambulatory  Vitals:   01/26/24 1043  BP: (!) 146/76  Pulse: 95  Resp: 18  Temp: 97.8 F (36.6 C)  SpO2: 98%   Filed Weights   01/26/24 1043  Weight: 113 lb 9.6 oz (51.5 kg)    Physical Exam Constitutional:      General: She is not in acute distress.    Appearance: She is not diaphoretic.  HENT:     Head: Normocephalic and atraumatic.  Eyes:     General: No scleral icterus. Cardiovascular:     Rate and Rhythm: Normal rate.  Pulmonary:     Effort: Pulmonary effort is normal. No respiratory distress.  Abdominal:     General: There is no distension.  Musculoskeletal:        General: Normal range of motion.     Cervical back: Normal range of motion.  Skin:    Findings: No erythema or rash.  Neurological:     Mental Status: She is alert and oriented to person, place, and time. Mental status is at baseline.     Cranial Nerves: No cranial nerve deficit.     Motor: No abnormal muscle tone.     Coordination: Coordination normal.  Psychiatric:         Mood and Affect: Mood and affect normal.      LABORATORY DATA:  I have reviewed the data as listed    Latest Ref Rng & Units 01/26/2024   10:15 AM 12/28/2023   11:00 AM 12/15/2023    9:00 AM  CBC  WBC 4.0 - 10.5 K/uL 11.9  9.4  13.1   Hemoglobin 12.0 - 15.0 g/dL 88.7  89.5  9.2   Hematocrit 36.0 - 46.0 % 35.8  32.7  29.0   Platelets 150 - 400 K/uL 370  368  383       Latest Ref Rng & Units 01/26/2024   10:15 AM 12/15/2023    9:00 AM 12/14/2023    8:30 AM  CMP  Glucose 70 - 99 mg/dL 859  876  890   BUN 8 - 23 mg/dL 18  12  13    Creatinine 0.44 - 1.00 mg/dL 8.72  9.41  9.47   Sodium 135 - 145 mmol/L 135  138  138   Potassium 3.5 -  5.1 mmol/L 3.5  3.3  3.5   Chloride 98 - 111 mmol/L 98  105  106   CO2 22 - 32 mmol/L 25  24  26    Calcium 8.9 - 10.3 mg/dL 8.7  7.8  7.3   Total Protein 6.5 - 8.1 g/dL 7.5     Total Bilirubin 0.0 - 1.2 mg/dL 0.5     Alkaline Phos 38 - 126 U/L 90     AST 15 - 41 U/L 20     ALT 0 - 44 U/L 12        RADIOGRAPHIC STUDIES: I have personally reviewed the radiological images as listed and agreed with the findings in the report. CT ABDOMEN PELVIS W CONTRAST Result Date: 01/20/2024 CLINICAL DATA:  Colostomy revision. Complicated by fistula. Drain placement. EXAM: CT ABDOMEN AND PELVIS WITH CONTRAST TECHNIQUE: Multidetector CT imaging of the abdomen and pelvis was performed using the standard protocol following bolus administration of intravenous contrast. RADIATION DOSE REDUCTION: This exam was performed according to the departmental dose-optimization program which includes automated exposure control, adjustment of the mA and/or kV according to patient size and/or use of iterative reconstruction technique. CONTRAST:  80mL OMNIPAQUE  IOHEXOL  300 MG/ML  SOLN COMPARISON:  Drain placement study 12/10/2023. CT with contrast 12/08/2023. Older exams as well FINDINGS: Lower chest: There is some linear opacity lung bases likely scar or atelectasis. No pleural effusion.  Some minimal areas of nodularity are also seen this includes middle lobe series 3, image 2 measuring 3 mm. 2 mm focus dependent right lower lobe image 6 of series 3. Some punctate calcifications in the left lung base are noted. These foci have been present since at least December 2023. Simple attention on follow-up to achieve 2 years of stability. Moderate hiatal hernia. Slight wall thickening as well in this location. Please correlate with any symptoms. Mild adjacent fat stranding. Hepatobiliary: Fatty liver infiltration identified. Slightly nodular contour. There is a dome segment 4 benign cystic lesion identified measuring 11 mm on series 2, image 6. This been present since at least December 2023. Patent portal vein. Gallbladder is nondilated. Pancreas: Mild pancreatic atrophy. Spleen: Spleen is nonenlarged. Adrenals/Urinary Tract: Stable nodular thickening of the left adrenal gland. This is felt to be an adenoma on MRI as well of 07/10/2022. Right adrenal glands preserved. Right kidney is slightly malrotated. Mild renal atrophy. Parapelvic left-sided renal cysts and small upper pole parenchymal cyst. No collecting system dilatation. Ureters have normal course and caliber extending down to the urinary bladder. Bladder is contracted. Stomach/Bowel: Stomach is underdistended. Again there is a moderate hiatal hernia with some fold thickening. Please correlate with symptoms. Oral contrast was administered. Subtotal colectomy identified. Small rectosigmoid stump. Residual cecum identified with colostomy. The small bowel is nondilated and diffusely opacifies with oral contrast. However there is a loop extending anteriorly along the mid abdomen abutting the anterior abdominal wall with contrast extravasation from this loop of mid to distal jejunum extending out to the defect along the anterior abdominal wall consistent with a small bowel fistula. Please see coronal image 30, axial image 42 of series 2 and sagittal  images 90 through 97. There is retracting soft tissue throughout this area with adjacent mesenteric stranding. The previous drains are no longer identified. Caudal to this thickening and fistula is an oblong complex fluid collection with mottled debris. This is in the area of the previous surgical drain. Fluid along the right-sided anterior pelvic wall along the previous drain tract. This smaller collection in  the axial plane on image 47 measures 8.2 by 2.8 cm and cephalocaudal proximally 3.3 cm. There are several smaller rim enhancing fluid collection identified along the mesentery. The focus along the left upper quadrant anterior to the stomach and loops of proximal small bowel was in the area of previous drain. This collection today on image 23 is without air and measures 5.1 x 2.1 cm. Series 2, image 23. On CT of Dec 08, 2023 this measured 7.5 by 2.5 cm. Small collection caudal to the inferior tip of the right hepatic lobe on image 29 measures 2.9 x 1.2 cm. Previously this collection measured 3.8 by 2.0 cm. Small area along the anterior low abdomen abutting the peritoneum measures 19 by 9 mm on image 34 series 2. Previously this was adjacent to the course of the surgical drain and measured 2.4 x 1.2 cm. No definite new loculated collections. No free air. No ascites. Scattered mesenteric stranding and thickening. Vascular/Lymphatic: Normal caliber aorta and IVC with scattered vascular calcifications along the aorta and branch vessels. No new areas of nodal enlargement identified at this time. Reproductive: Status post hysterectomy. No adnexal masses. Other: Multiple fistula tracks along the anterior abdominal wall at the level of the ostomy and aforementioned small bowel fistula. Musculoskeletal: Scattered degenerative changes. Curvature of the spine. There is severe compression of the T12 level with some kyphosis. Is also some displacement of the posterior wall of the vertebral body at this location with some  canal encroachment. Stable appearance to previous. Critical Value/emergent results were called by telephone at the time of interpretation on 01/20/2024 at 2:39 pm to provider [facility] , who verbally acknowledged these results. IMPRESSION: Surgical changes of subtotal colectomy with right lower quadrant colostomy. There is some residual cecum as well as a separate rectosigmoid stump. Complex anterior abdominal wall fistula with extravasation of contrast from a loop of small bowel in this location extending out to the anterior abdominal wall. Multiple fistula tracks along the subcutaneous fat. The draining visualized small bowel loops appears to be more distal jejunal. No signs of obstruction or widespread free air. The previous surgical and percutaneous drains are no longer identified. The collections seen previously are still present and appears slightly smaller. The largest collection identified which appears to communicate with the fistula site and extending just inferior to this location does have significant complex luminal debris. Please correlate for any clinical signs of infection. The drain track for this specific collection does have some fluid along the anterior pelvic wall. The other collections are without air. No new rim enhancing fluid collections suggested. Moderate hiatal hernia with some wall thickening along the stomach. Please correlate with symptoms. Fatty liver infiltration. Electronically Signed   By: Ranell Bring M.D.   On: 01/20/2024 14:42

## 2024-01-26 NOTE — Assessment & Plan Note (Signed)
 Previously s/p Venofer  treatments.  Lab Results  Component Value Date   HGB 11.2 (L) 01/26/2024   TIBC 140 (L) 11/30/2023   IRONPCTSAT 11 11/30/2023   FERRITIN 1,120 (H) 11/30/2023    Her hemoglobin has improved compared to the level at the time of discharge.  Monitor.

## 2024-01-26 NOTE — Assessment & Plan Note (Signed)
 4 millimeter, T1a, N0; ER 90%, PR 0%, HER-2/neu negative. Declined radiation therapy.  She finished 6 years of Letrozole  ad now is off treatment.  previously managed by Dr. Dessa.  Annual screening mammogram-next due in January 2026

## 2024-01-27 DIAGNOSIS — H401134 Primary open-angle glaucoma, bilateral, indeterminate stage: Secondary | ICD-10-CM | POA: Diagnosis not present

## 2024-01-27 DIAGNOSIS — H3563 Retinal hemorrhage, bilateral: Secondary | ICD-10-CM | POA: Diagnosis not present

## 2024-01-27 DIAGNOSIS — H35033 Hypertensive retinopathy, bilateral: Secondary | ICD-10-CM | POA: Diagnosis not present

## 2024-01-27 DIAGNOSIS — I10 Essential (primary) hypertension: Secondary | ICD-10-CM | POA: Diagnosis not present

## 2024-01-27 LAB — CEA: CEA: 2 ng/mL (ref 0.0–4.7)

## 2024-01-27 NOTE — Telephone Encounter (Signed)
 Patient is called and informed of her CT results.  Patient reminded and encouraged to keep follow up appointment with Dr. Jordis 02/01/24.

## 2024-01-28 ENCOUNTER — Ambulatory Visit: Admitting: Family Medicine

## 2024-01-28 DIAGNOSIS — R32 Unspecified urinary incontinence: Secondary | ICD-10-CM | POA: Diagnosis not present

## 2024-01-28 DIAGNOSIS — I10 Essential (primary) hypertension: Secondary | ICD-10-CM | POA: Diagnosis not present

## 2024-01-28 DIAGNOSIS — K651 Peritoneal abscess: Secondary | ICD-10-CM | POA: Diagnosis not present

## 2024-01-28 DIAGNOSIS — M1990 Unspecified osteoarthritis, unspecified site: Secondary | ICD-10-CM | POA: Diagnosis not present

## 2024-01-28 DIAGNOSIS — K9403 Colostomy malfunction: Secondary | ICD-10-CM | POA: Diagnosis not present

## 2024-01-28 DIAGNOSIS — D649 Anemia, unspecified: Secondary | ICD-10-CM | POA: Diagnosis not present

## 2024-01-28 DIAGNOSIS — M8580 Other specified disorders of bone density and structure, unspecified site: Secondary | ICD-10-CM | POA: Diagnosis not present

## 2024-01-28 DIAGNOSIS — G43909 Migraine, unspecified, not intractable, without status migrainosus: Secondary | ICD-10-CM | POA: Diagnosis not present

## 2024-01-28 DIAGNOSIS — Z853 Personal history of malignant neoplasm of breast: Secondary | ICD-10-CM | POA: Diagnosis not present

## 2024-01-28 DIAGNOSIS — K632 Fistula of intestine: Secondary | ICD-10-CM | POA: Diagnosis not present

## 2024-01-28 DIAGNOSIS — Z8701 Personal history of pneumonia (recurrent): Secondary | ICD-10-CM | POA: Diagnosis not present

## 2024-02-01 ENCOUNTER — Ambulatory Visit (INDEPENDENT_AMBULATORY_CARE_PROVIDER_SITE_OTHER): Admitting: Surgery

## 2024-02-01 ENCOUNTER — Encounter: Payer: Self-pay | Admitting: Family Medicine

## 2024-02-01 ENCOUNTER — Ambulatory Visit (INDEPENDENT_AMBULATORY_CARE_PROVIDER_SITE_OTHER): Admitting: Family Medicine

## 2024-02-01 ENCOUNTER — Encounter: Payer: Self-pay | Admitting: Surgery

## 2024-02-01 VITALS — BP 113/74 | HR 111 | Temp 97.9°F | Ht 60.0 in | Wt 112.0 lb

## 2024-02-01 VITALS — BP 116/76 | HR 106 | Temp 97.8°F | Resp 18 | Ht 60.0 in | Wt 112.0 lb

## 2024-02-01 DIAGNOSIS — N179 Acute kidney failure, unspecified: Secondary | ICD-10-CM | POA: Diagnosis not present

## 2024-02-01 DIAGNOSIS — I10 Essential (primary) hypertension: Secondary | ICD-10-CM | POA: Diagnosis not present

## 2024-02-01 DIAGNOSIS — Z09 Encounter for follow-up examination after completed treatment for conditions other than malignant neoplasm: Secondary | ICD-10-CM

## 2024-02-01 DIAGNOSIS — K9409 Other complications of colostomy: Secondary | ICD-10-CM

## 2024-02-01 DIAGNOSIS — R634 Abnormal weight loss: Secondary | ICD-10-CM

## 2024-02-01 DIAGNOSIS — R110 Nausea: Secondary | ICD-10-CM

## 2024-02-01 LAB — SIGNATERA
SIGNATERA MTM READOUT: 0.11 MTM/ml — AB
SIGNATERA TEST RESULT: POSITIVE — AB

## 2024-02-01 MED ORDER — MIRTAZAPINE 7.5 MG PO TABS: 7.5000 mg | ORAL_TABLET | Freq: Every day | ORAL | 3 refills | Status: DC

## 2024-02-01 MED ORDER — ONDANSETRON HCL 4 MG PO TABS: 4.0000 mg | ORAL_TABLET | Freq: Three times a day (TID) | ORAL | 1 refills | Status: AC | PRN

## 2024-02-01 NOTE — Progress Notes (Signed)
 Outpatient Surgical Follow Up  02/01/2024  Kristin Miller is an 88 y.o. female.   Chief Complaint  Patient presents with   Routine Post Op    Colostomy revision 10/27/23     HPI: Sarayu 3 months out from colectomy with end colostomy.  She developed full necrosis of her stoma requiring revision.  She is now 2 months out from the revision.  During the revision her abdomen was very hostile with severe inflammation of the small bowel resulting in a small fistula.  She had a prolonged hospitalization for about a month.  She did have an intra-abdominal abscess that was drained by interventional radiology.  She now comes in for follow-up. All her drains are out. Recent CT pers reviewed, evidence of known fistula w/o new collection or acute issues   She has been taking diet and her colostomy is working.  Now she has got an Eakin pouch to the fistula and is draining a few ounces a day.  No fevers no chills.  She is not on imodium  They are changing the Eakin and colostomy appliance twice a week  Past Medical History:  Diagnosis Date   Allergy    Anemia    H/O AS A CHILD   Arthritis    Back pain    Breast cancer of upper-outer quadrant of right female breast (HCC) 06/12/2015   4 millimeter, T1a, N0; ER 90%, PR 0%, HER-2/neu not overexpressed.  Declined radiation therapy.   Collar bone fracture 05/28/2013   Fractured Collar bone on left   Dyspnea    Dysrhythmia    h/o heart skipping beat years ago   Frequent headaches    h/o migraines   Hypertension    Osteopenia 04/2018   Bone density without significant interval change since 2017.   Pneumonia     Past Surgical History:  Procedure Laterality Date   ABDOMINAL HYSTERECTOMY  1970   Partial   BREAST BIOPSY Right 05/28/2015   INVASIVE MAMMARY CARCINOMA    BREAST BIOPSY Right 06/12/2015   Wide excision, sentinel lymph node biopsy.   BREAST EXCISIONAL BIOPSY Right    BREAST LUMPECTOMY WITH AXILLARY LYMPH NODE DISSECTION Right 06/12/2015    4 mm, T1a,N0 withDCIS with clear margins. ER: 90%, PR 0%; Her 2 neu not overexpressed.    CATARACT EXTRACTION W/PHACO Left 02/03/2017   Procedure: CATARACT EXTRACTION PHACO AND INTRAOCULAR LENS PLACEMENT (IOC);  Surgeon: Jaye Fallow, MD;  Location: [facility];  Service: Ophthalmology;  Laterality: Left;  US   00:28 AP% 16.3 CDE 4.68 Fluid pack lot # [id number]   CATARACT EXTRACTION W/PHACO Right 03/01/2019   Procedure: CATARACT EXTRACTION PHACO AND INTRAOCULAR LENS PLACEMENT (IOC)  RIGHT;  Surgeon: Jaye Fallow, MD;  Location: [facility];  Service: Ophthalmology;  Laterality: Right;   COLONOSCOPY     COLONOSCOPY WITH PROPOFOL  N/A 06/27/2022   Procedure: COLONOSCOPY WITH PROPOFOL ;  Surgeon: Jinny Carmine, MD;  Location: [facility];  Service: Endoscopy;  Laterality: N/A;   COLONOSCOPY WITH PROPOFOL  N/A 10/06/2023   Procedure: COLONOSCOPY WITH PROPOFOL ;  Surgeon: Jinny Carmine, MD;  Location: [facility];  Service: Endoscopy;  Laterality: N/A;   COLOSTOMY REVISION N/A 11/13/2023   Procedure: REVISION, COLOSTOMY;  Surgeon: Jordis Laneta FALCON, MD;  Location: [facility];  Service: General;  Laterality: N/A;   ESOPHAGOGASTRODUODENOSCOPY (EGD) WITH PROPOFOL  N/A 06/27/2022   Procedure: ESOPHAGOGASTRODUODENOSCOPY (EGD) WITH PROPOFOL ;  Surgeon: Jinny Carmine, MD;  Location: [facility];  Service: Endoscopy;  Laterality: N/A;   PARTIAL COLECTOMY  N/A 07/29/2022   Procedure: PARTIAL COLECTOMY, open left, RNFA to assist;  Surgeon: Jordis Laneta FALCON, MD;  Location: [facility];  Service: General;  Laterality: N/A;   POLYPECTOMY  10/06/2023   Procedure: POLYPECTOMY;  Surgeon: Jinny Carmine, MD;  Location: [facility] ENDOSCOPY;  Service: Endoscopy;;    Family History  Problem Relation Age of Onset   Mental illness Mother        Dementia   Stroke Father    Lung cancer Brother    Breast cancer Other        dx under 61   Brain cancer Granddaughter 12       glioblastoma    Social History:   reports that she has never smoked. She has never been exposed to tobacco smoke. She has never used smokeless tobacco. She reports that she does not drink alcohol and does not use drugs.  Allergies:  Allergies  Allergen Reactions   Combigan  [Brimonidine  Tartrate-Timolol ]     Redness, burning, irritation    Lisinopril  Cough   Other Itching and Rash    SUTURES-(PARTIAL HYSTERECTOMY)    Medications reviewed.    ROS Full ROS performed and is otherwise negative other than what is stated in HPI   BP 113/74   Pulse (!) 111   Temp 97.9 F (36.6 C) (Oral)   Ht 5' (1.524 m)   Wt 112 lb (50.8 kg)   SpO2 96%   BMI 21.87 kg/m   Physical Exam NAd chronically ill, walks independently   Abdomen: Soft, non-tender, non-distended, no rebound/guarding. Colostomy in right central abdomen, this is pink, patent and productive, does sit in skin fold. To the left Eakin pouch w some thin serofeculent fluid.  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

## 2024-02-01 NOTE — Progress Notes (Signed)
 Established Patient Office Visit  Subjective   Patient ID: Kristin Miller, female    DOB: 03/06/1936  Age: 88 y.o. MRN: [id number]  Chief Complaint  Patient presents with   Medical Management of Chronic Issues    HPI Delightful 88 yo woman with history of colon cancer (07/2022), s/p sigmoid colectomy and pelvic wall resection with negative margins, osteoporosis with T12 compression fracture, iron  deficiency anemia, breast cancer (ER +, PR negative, HER2/NEU no over expression) s/p lumpectomy and 5 weeks of adjuvant XRT, colonoscopy (10/06/2023, recurrence of colon cancer with large mass in descending colon), S/p left colectomy with end colostomy followed by revision of the stoma (necrosis of the original stoma), small bowel fistula, intra-abdominal abscess that was drained by IR.   Initially she had 4 drains.  She has an IT consultant the original ostomy and the fistula.  She had a long hospital stay from 11/13/2023 to 12/15/2023.   Discussed the use of AI scribe software for clinical note transcription with the patient, who gave verbal consent to proceed.  History of Present Illness   Kristin Miller is an 88 year old female who presents with weight loss and follow-up for stoma management.  She has been experiencing ongoing weight loss, with her current weight at 112 pounds. Although her weight has remained stable since her last visit, there is an overall downward trend.  She reports that her appetite is poor she has to force herself to eat.  She has some nausea.  The smell of food makes her sick sometimes.  She denies actual vomiting.  She does not like Ensure or even with ice cream she does not like it.  The cause of the weight loss is not detailed, but it may be related to her recent surgery and recovery process.  She is under the care of a surgeon for stoma management, utilizing two bags. Three drainage tubes were removed upon her discharge from the hospital.  There has been no  significant improvement in her drainage from her stomas or her drainage tube.  She reports very little abdominal pain.  She has a hernia in her side and sometimes when she stands she will get a knot, muscle spasm type feeling that abates in a few minutes.  This is the only time she feels pain.  Reviewed her 01/26/2024 labs.  Her anemia is improving with hemoglobin now 11.2.  She has mild leukocytosis of 11.9 but this is an improvement. She has a creatinine of 1.27.  Her baseline is typically 0.58-0.60.  BUN is 18.  She is taking Losartan  100-12.5 mg daily for her blood pressure.  She also has a prescription for amlodipine  5 mg but she is holding this for now.      ROS    Objective:     BP 116/76 (BP Location: Left Arm, Patient Position: Sitting, Cuff Size: Normal)   Pulse (!) 106   Temp 97.8 F (36.6 C) (Oral)   Resp 18   Ht 5' (1.524 m)   Wt 112 lb (50.8 kg)   SpO2 95%   BMI 21.87 kg/m    Physical Exam Vitals and nursing note reviewed.  Constitutional:      Appearance: Normal appearance.  HENT:     Head: Normocephalic and atraumatic.  Eyes:     Conjunctiva/sclera: Conjunctivae normal.  Cardiovascular:     Rate and Rhythm: Normal rate and regular rhythm.  Pulmonary:     Effort: Pulmonary effort is normal.  Breath sounds: Normal breath sounds.  Musculoskeletal:     Right lower leg: No edema.     Left lower leg: No edema.  Skin:    General: Skin is warm and dry.  Neurological:     Mental Status: She is alert and oriented to person, place, and time.  Psychiatric:        Mood and Affect: Mood normal.        Behavior: Behavior normal.        Thought Content: Thought content normal.        Judgment: Judgment normal.          No results found for any visits on 02/01/24.    The ASCVD Risk score (Arnett DK, et al., 2019) failed to calculate for the following reasons:   The 2019 ASCVD risk score is only valid for ages 59 to 12    Assessment & Plan:    Weight  loss Assessment & Plan: Her lack of appetite may be partially attributed to low blood pressure and AKI.  Hopefully correction of her BP regimen may alleviate this.  Continue mirtazepine 7.5mg  for appetite stimulation.  Ondansetron  for nausea  Orders: -     Mirtazapine ; Take 1 tablet (7.5 mg total) by mouth at bedtime.  Dispense: 30 tablet; Refill: 3 -     Ondansetron  HCl; Take 1 tablet (4 mg total) by mouth every 8 (eight) hours as needed for nausea or vomiting.  Dispense: 20 tablet; Refill: 1  Nausea Assessment & Plan: Ondansetron  4mg  every 8 hours for nausea,     AKI (acute kidney injury) (HCC) Assessment & Plan: Creatinine is up to 1.27 and her baseline is typically 0.58-0.60.  BUN is 18.  Dehydration and ARB have likely cause of this.  Stop losartan -HCTZ 100-12.5.  Please drink lots of fluids.  Will recheck function in a month.   Primary hypertension Assessment & Plan: Ask her to stop losartan -HCTZ.  Start back on amlodipine  5 mg daily.  Follow-up for blood pressure check in a month.   Return in about 4 weeks (around 02/29/2024).    Talar Fraley K Kippy Gohman, MD

## 2024-02-01 NOTE — Patient Instructions (Signed)
 Colostomy Reversal Surgery, Care After The following information offers guidance on how to care for yourself after your procedure. Your health care provider may also give you more specific instructions. If you have problems or questions, contact your health care provider. What can I expect after the procedure? After the procedure, it is common to have: Pain and discomfort in your abdomen, especially near your incision. Decreased appetite. You may have temporary bowel changes, such as: Passing gas (flatulence). An urgent need to have a bowel movement. More frequent bowel movements. Some leakage of stool or the inability to control when you have bowel movements (incontinence). This may cause skin soreness around your rectum due to exposure to stool and wiping of skin. This usually gets better within a couple weeks. Loose stools or diarrhea. Constipation. Follow these instructions at home: Activity  You may have to avoid lifting. Ask your health care provider how much you can safely lift. Return to your normal activities as told by your health care provider. Ask your health care provider what activities are safe for you. Avoid sitting for a long time without moving. Get up to take short walks every 1-2 hours. This is important to improve blood flow and breathing. Ask for help if you feel weak or unsteady. Avoid activities that take a lot of effort, contact sports, and abdominal exercises for 4 weeks or as long as told by your health care provider. Incision care  Follow instructions from your health care provider about how to take care of your incision. Make sure you: Wash your hands with soap and water  for at least 20 seconds before and after you change your bandage (dressing). If soap and water  are not available, use hand sanitizer. Change your dressing as told by your health care provider. Leave stitches (sutures), skin glue, or adhesive strips in place. These skin closures may need to stay  in place for 2 weeks or longer. If adhesive strip edges start to loosen and curl up, you may trim the loose edges. Do not remove adhesive strips completely unless your health care provider tells you to do that. Keep the incision area clean and dry. Check your incision area every day for signs of infection. Check for: More redness, swelling, or pain. More fluid or blood. Warmth. Pus or a bad smell. To protect the incision, hold a folded blanket or small pillow against your abdomen when coughing, sneezing, or bending. Bathing Do not take baths, swim, or use a hot tub until your health care provider approves. Ask your health care provider if you may take showers. You may only be allowed to take sponge baths. If your health care provider approves bathing and showering, cover the dressing with a watertight covering to protect it from water . Do not let the dressing get wet. Keep the dressing dry until your health care provider says it can be removed. Driving If you were given a sedative during the procedure, it can affect you for several hours. Do not drive or operate machinery until your health care provider says that it is safe. Ask your health care provider if the medicine prescribed to you requires you to avoid driving or using machinery. Follow other driving restrictions as told by your health care provider. Eating and drinking Follow instructions from your health care provider about eating or drinking restrictions. This may include: What to eat and drink. You may be told to start eating a bland diet. Over time, you may slowly return to a more normal, healthy  diet. How much to eat and drink. You should eat small meals often and stop eating when you feel full. Take nutrition supplements as told by your health care provider or dietitian. General instructions Take over-the-counter and prescription medicines only as told by your health care provider. Take steps to treat diarrhea or constipation as  told by your health care provider. Your health care provider may recommend that you: Drink enough fluid to keep your urine pale yellow. Avoid fluids that contain a lot of sugar or caffeine, such as energy drinks, sports drinks, and soda. Eat bland, easy-to-digest foods in small amounts as you are able. These foods include bananas, applesauce, rice, lean meats, toast, and crackers. Take over-the-counter or prescription medicines. Limit foods that are high in fat and processed sugars, such as fried or sweet foods. If you have skin soreness around your rectum, apply a skin barrier ointment or paste. This can help prevent irritation from occurring or getting worse. Do not use any products that contain nicotine or tobacco. These products include cigarettes, chewing tobacco, and vaping devices, such as e-cigarettes. These can delay incision healing after surgery. If you need help quitting, ask your health care provider. Keep all follow-up visits. This is important. Contact a health care provider if: You have any of these signs of infection: More redness, swelling, or pain at the site of your incision. More fluid or blood coming from your incision. Warmth coming from your incision. Pus or a bad smell coming from your incision. A fever. Your incision breaks open. You feel nauseous. You are constipated, or not able to have a bowel movement. Your diarrhea gets worse. You have pain that is not controlled with medicine. Get help right away if: You have abdominal pain that does not go away or becomes severe. You have frequent vomiting and you are not able to eat or drink. You have difficulty breathing. Summary After colostomy reversal surgery, it is common to have abdominal pain, decreased appetite, diarrhea, or constipation. Follow instructions from your health care provider about how to take care of your incision. Do not let the dressing get wet. Take over-the-counter and prescription medicines only  as told by your health care provider. Contact your health care provider if you are constipated, or not able to have a bowel movement. Keep all follow-up visits. This is important. This information is not intended to replace advice given to you by your health care provider. Make sure you discuss any questions you have with your health care provider. Document Revised: 02/10/2023 Document Reviewed: 03/01/2021 Elsevier Patient Education  2024 [employer].

## 2024-02-02 ENCOUNTER — Encounter: Payer: Self-pay | Admitting: Oncology

## 2024-02-02 DIAGNOSIS — Z8701 Personal history of pneumonia (recurrent): Secondary | ICD-10-CM | POA: Diagnosis not present

## 2024-02-02 DIAGNOSIS — Z853 Personal history of malignant neoplasm of breast: Secondary | ICD-10-CM | POA: Diagnosis not present

## 2024-02-02 DIAGNOSIS — K632 Fistula of intestine: Secondary | ICD-10-CM | POA: Diagnosis not present

## 2024-02-02 DIAGNOSIS — M8580 Other specified disorders of bone density and structure, unspecified site: Secondary | ICD-10-CM | POA: Diagnosis not present

## 2024-02-02 DIAGNOSIS — G43909 Migraine, unspecified, not intractable, without status migrainosus: Secondary | ICD-10-CM | POA: Diagnosis not present

## 2024-02-02 DIAGNOSIS — M1990 Unspecified osteoarthritis, unspecified site: Secondary | ICD-10-CM | POA: Diagnosis not present

## 2024-02-02 DIAGNOSIS — R32 Unspecified urinary incontinence: Secondary | ICD-10-CM | POA: Diagnosis not present

## 2024-02-02 DIAGNOSIS — K9403 Colostomy malfunction: Secondary | ICD-10-CM | POA: Diagnosis not present

## 2024-02-02 DIAGNOSIS — D649 Anemia, unspecified: Secondary | ICD-10-CM | POA: Diagnosis not present

## 2024-02-02 DIAGNOSIS — K651 Peritoneal abscess: Secondary | ICD-10-CM | POA: Diagnosis not present

## 2024-02-02 DIAGNOSIS — I10 Essential (primary) hypertension: Secondary | ICD-10-CM | POA: Diagnosis not present

## 2024-02-04 DIAGNOSIS — R110 Nausea: Secondary | ICD-10-CM | POA: Insufficient documentation

## 2024-02-04 DIAGNOSIS — N179 Acute kidney failure, unspecified: Secondary | ICD-10-CM | POA: Insufficient documentation

## 2024-02-04 DIAGNOSIS — R634 Abnormal weight loss: Secondary | ICD-10-CM | POA: Insufficient documentation

## 2024-02-04 NOTE — Assessment & Plan Note (Signed)
 Ask her to stop losartan -HCTZ.  Start back on amlodipine  5 mg daily.  Follow-up for blood pressure check in a month.

## 2024-02-04 NOTE — Assessment & Plan Note (Signed)
 Her lack of appetite may be partially attributed to low blood pressure and AKI.  Hopefully correction of her BP regimen may alleviate this.  Continue mirtazepine 7.5mg  for appetite stimulation.  Ondansetron  for nausea

## 2024-02-04 NOTE — Assessment & Plan Note (Signed)
 Ondansetron  4mg  every 8 hours for nausea,

## 2024-02-04 NOTE — Assessment & Plan Note (Signed)
 Creatinine is up to 1.27 and her baseline is typically 0.58-0.60.  BUN is 18.  Dehydration and ARB have likely cause of this.  Stop losartan -HCTZ 100-12.5.  Please drink lots of fluids.  Will recheck function in a month.

## 2024-02-05 DIAGNOSIS — D649 Anemia, unspecified: Secondary | ICD-10-CM | POA: Diagnosis not present

## 2024-02-05 DIAGNOSIS — G43909 Migraine, unspecified, not intractable, without status migrainosus: Secondary | ICD-10-CM | POA: Diagnosis not present

## 2024-02-05 DIAGNOSIS — I10 Essential (primary) hypertension: Secondary | ICD-10-CM | POA: Diagnosis not present

## 2024-02-05 DIAGNOSIS — K632 Fistula of intestine: Secondary | ICD-10-CM | POA: Diagnosis not present

## 2024-02-05 DIAGNOSIS — Z8701 Personal history of pneumonia (recurrent): Secondary | ICD-10-CM | POA: Diagnosis not present

## 2024-02-05 DIAGNOSIS — Z853 Personal history of malignant neoplasm of breast: Secondary | ICD-10-CM | POA: Diagnosis not present

## 2024-02-05 DIAGNOSIS — M1990 Unspecified osteoarthritis, unspecified site: Secondary | ICD-10-CM | POA: Diagnosis not present

## 2024-02-05 DIAGNOSIS — K9403 Colostomy malfunction: Secondary | ICD-10-CM | POA: Diagnosis not present

## 2024-02-05 DIAGNOSIS — M8580 Other specified disorders of bone density and structure, unspecified site: Secondary | ICD-10-CM | POA: Diagnosis not present

## 2024-02-05 DIAGNOSIS — K651 Peritoneal abscess: Secondary | ICD-10-CM | POA: Diagnosis not present

## 2024-02-05 DIAGNOSIS — R32 Unspecified urinary incontinence: Secondary | ICD-10-CM | POA: Diagnosis not present

## 2024-02-08 DIAGNOSIS — M1990 Unspecified osteoarthritis, unspecified site: Secondary | ICD-10-CM | POA: Diagnosis not present

## 2024-02-08 DIAGNOSIS — K651 Peritoneal abscess: Secondary | ICD-10-CM | POA: Diagnosis not present

## 2024-02-08 DIAGNOSIS — M8580 Other specified disorders of bone density and structure, unspecified site: Secondary | ICD-10-CM | POA: Diagnosis not present

## 2024-02-08 DIAGNOSIS — K9403 Colostomy malfunction: Secondary | ICD-10-CM | POA: Diagnosis not present

## 2024-02-08 DIAGNOSIS — Z8701 Personal history of pneumonia (recurrent): Secondary | ICD-10-CM | POA: Diagnosis not present

## 2024-02-08 DIAGNOSIS — G43909 Migraine, unspecified, not intractable, without status migrainosus: Secondary | ICD-10-CM | POA: Diagnosis not present

## 2024-02-08 DIAGNOSIS — R32 Unspecified urinary incontinence: Secondary | ICD-10-CM | POA: Diagnosis not present

## 2024-02-08 DIAGNOSIS — D649 Anemia, unspecified: Secondary | ICD-10-CM | POA: Diagnosis not present

## 2024-02-08 DIAGNOSIS — K632 Fistula of intestine: Secondary | ICD-10-CM | POA: Diagnosis not present

## 2024-02-08 DIAGNOSIS — I10 Essential (primary) hypertension: Secondary | ICD-10-CM | POA: Diagnosis not present

## 2024-02-08 DIAGNOSIS — Z853 Personal history of malignant neoplasm of breast: Secondary | ICD-10-CM | POA: Diagnosis not present

## 2024-02-10 DIAGNOSIS — G43909 Migraine, unspecified, not intractable, without status migrainosus: Secondary | ICD-10-CM | POA: Diagnosis not present

## 2024-02-10 DIAGNOSIS — K9403 Colostomy malfunction: Secondary | ICD-10-CM | POA: Diagnosis not present

## 2024-02-10 DIAGNOSIS — Z853 Personal history of malignant neoplasm of breast: Secondary | ICD-10-CM | POA: Diagnosis not present

## 2024-02-10 DIAGNOSIS — K651 Peritoneal abscess: Secondary | ICD-10-CM | POA: Diagnosis not present

## 2024-02-10 DIAGNOSIS — M1990 Unspecified osteoarthritis, unspecified site: Secondary | ICD-10-CM | POA: Diagnosis not present

## 2024-02-10 DIAGNOSIS — D649 Anemia, unspecified: Secondary | ICD-10-CM | POA: Diagnosis not present

## 2024-02-10 DIAGNOSIS — K632 Fistula of intestine: Secondary | ICD-10-CM | POA: Diagnosis not present

## 2024-02-10 DIAGNOSIS — Z8701 Personal history of pneumonia (recurrent): Secondary | ICD-10-CM | POA: Diagnosis not present

## 2024-02-10 DIAGNOSIS — R32 Unspecified urinary incontinence: Secondary | ICD-10-CM | POA: Diagnosis not present

## 2024-02-10 DIAGNOSIS — M8580 Other specified disorders of bone density and structure, unspecified site: Secondary | ICD-10-CM | POA: Diagnosis not present

## 2024-02-10 DIAGNOSIS — I10 Essential (primary) hypertension: Secondary | ICD-10-CM | POA: Diagnosis not present

## 2024-02-11 ENCOUNTER — Telehealth: Payer: Self-pay | Admitting: Family Medicine

## 2024-02-11 NOTE — Telephone Encounter (Signed)
 Copied from CRM [id number]. Topic: Clinical - Home Health Verbal Orders >> Feb 11, 2024  2:44 PM Delon DASEN wrote: Caller/Agency: Dorthea with [facility] Callback Number: [phone] Service Requested: Skilled Nursing Frequency: n/a Any new concerns about the patient? No- want to add another visit for next week, time to renew orders

## 2024-02-12 DIAGNOSIS — K9403 Colostomy malfunction: Secondary | ICD-10-CM | POA: Diagnosis not present

## 2024-02-12 DIAGNOSIS — M8580 Other specified disorders of bone density and structure, unspecified site: Secondary | ICD-10-CM | POA: Diagnosis not present

## 2024-02-12 DIAGNOSIS — D649 Anemia, unspecified: Secondary | ICD-10-CM | POA: Diagnosis not present

## 2024-02-12 DIAGNOSIS — Z8701 Personal history of pneumonia (recurrent): Secondary | ICD-10-CM | POA: Diagnosis not present

## 2024-02-12 DIAGNOSIS — M1990 Unspecified osteoarthritis, unspecified site: Secondary | ICD-10-CM | POA: Diagnosis not present

## 2024-02-12 DIAGNOSIS — G43909 Migraine, unspecified, not intractable, without status migrainosus: Secondary | ICD-10-CM | POA: Diagnosis not present

## 2024-02-12 DIAGNOSIS — I10 Essential (primary) hypertension: Secondary | ICD-10-CM | POA: Diagnosis not present

## 2024-02-12 DIAGNOSIS — Z853 Personal history of malignant neoplasm of breast: Secondary | ICD-10-CM | POA: Diagnosis not present

## 2024-02-12 DIAGNOSIS — R32 Unspecified urinary incontinence: Secondary | ICD-10-CM | POA: Diagnosis not present

## 2024-02-12 DIAGNOSIS — K632 Fistula of intestine: Secondary | ICD-10-CM | POA: Diagnosis not present

## 2024-02-12 DIAGNOSIS — K651 Peritoneal abscess: Secondary | ICD-10-CM | POA: Diagnosis not present

## 2024-02-15 ENCOUNTER — Ambulatory Visit (INDEPENDENT_AMBULATORY_CARE_PROVIDER_SITE_OTHER): Admitting: Surgery

## 2024-02-15 ENCOUNTER — Encounter: Payer: Self-pay | Admitting: Surgery

## 2024-02-15 VITALS — BP 130/75 | HR 95 | Ht 60.0 in | Wt 112.2 lb

## 2024-02-15 DIAGNOSIS — K9400 Colostomy complication, unspecified: Secondary | ICD-10-CM

## 2024-02-15 DIAGNOSIS — K632 Fistula of intestine: Secondary | ICD-10-CM | POA: Diagnosis not present

## 2024-02-15 DIAGNOSIS — K9403 Colostomy malfunction: Secondary | ICD-10-CM | POA: Diagnosis not present

## 2024-02-15 DIAGNOSIS — K651 Peritoneal abscess: Secondary | ICD-10-CM | POA: Diagnosis not present

## 2024-02-15 DIAGNOSIS — Z853 Personal history of malignant neoplasm of breast: Secondary | ICD-10-CM | POA: Diagnosis not present

## 2024-02-15 DIAGNOSIS — G43909 Migraine, unspecified, not intractable, without status migrainosus: Secondary | ICD-10-CM | POA: Diagnosis not present

## 2024-02-15 DIAGNOSIS — M1990 Unspecified osteoarthritis, unspecified site: Secondary | ICD-10-CM | POA: Diagnosis not present

## 2024-02-15 DIAGNOSIS — R32 Unspecified urinary incontinence: Secondary | ICD-10-CM | POA: Diagnosis not present

## 2024-02-15 DIAGNOSIS — M8580 Other specified disorders of bone density and structure, unspecified site: Secondary | ICD-10-CM | POA: Diagnosis not present

## 2024-02-15 DIAGNOSIS — Z8701 Personal history of pneumonia (recurrent): Secondary | ICD-10-CM | POA: Diagnosis not present

## 2024-02-15 DIAGNOSIS — D649 Anemia, unspecified: Secondary | ICD-10-CM | POA: Diagnosis not present

## 2024-02-15 DIAGNOSIS — I10 Essential (primary) hypertension: Secondary | ICD-10-CM | POA: Diagnosis not present

## 2024-02-15 NOTE — Patient Instructions (Signed)
 Try eating some fresh vegetables to see if this helps. Do eat plenty of protein also to help with healing. Be sure to drink plenty of fluids.     Follow-up with our office in 2-3 weeks.  Please call and ask to speak with a nurse if you develop questions or concerns.

## 2024-02-15 NOTE — Progress Notes (Signed)
 Outpatient Surgical Follow Up  02/15/2024  Kristin Miller is an 88 y.o. female.   Chief Complaint  Patient presents with   Follow-up    HPI: Georganna 3 1/2 months out from colectomy with end colostomy.  She developed full necrosis of her stoma requiring revision.  She is now 2 months out from the revision.  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  She had a prolonged hospitalization for about a month.  She did have an intra-abdominal abscess that was drained by interventional radiology.  She now comes in for follow-up. All her drains are out. Most Recent CT pers reviewed, evidence of known fistula w/o new collection or acute issues   She has been taking diet and her colostomy is working.  Now she has got an Eakin pouch to the fistula and is draining a 1-2 ounces a day.  No fevers no chills.  She  has started on imodium  w good results and further decrease in fistula output They are changing the Eakin and colostomy appliance twice a week. Maranda who is her husband wishes for me to take the ostomy and eakin pouch off today She is tolerating PO and today she looks better w more energy  Past Medical History:  Diagnosis Date   Allergy    Anemia    H/O AS A CHILD   Arthritis    Back pain    Breast cancer of upper-outer quadrant of right female breast (HCC) 06/12/2015   4 millimeter, T1a, N0; ER 90%, PR 0%, HER-2/neu not overexpressed.  Declined radiation therapy.   Collar bone fracture 05/28/2013   Fractured Collar bone on left   Dyspnea    Dysrhythmia    h/o heart skipping beat years ago   Frequent headaches    h/o migraines   Hypertension    Osteopenia 04/2018   Bone density without significant interval change since 2017.   Pneumonia     Past Surgical History:  Procedure Laterality Date   ABDOMINAL HYSTERECTOMY  1970   Partial   BREAST BIOPSY Right 05/28/2015   INVASIVE MAMMARY CARCINOMA     BREAST BIOPSY Right 06/12/2015   Wide excision, sentinel lymph node biopsy.   BREAST EXCISIONAL BIOPSY Right    BREAST LUMPECTOMY WITH AXILLARY LYMPH NODE DISSECTION Right 06/12/2015   4 mm, T1a,N0 withDCIS with clear margins. ER: 90%, PR 0%; Her 2 neu not overexpressed.    CATARACT EXTRACTION W/PHACO Left 02/03/2017   Procedure: CATARACT EXTRACTION PHACO AND INTRAOCULAR LENS PLACEMENT (IOC);  Surgeon: Jaye Fallow, MD;  Location: [facility];  Service: Ophthalmology;  Laterality: Left;  US   00:28 AP% 16.3 CDE 4.68 Fluid pack lot # [id number]   CATARACT EXTRACTION W/PHACO Right 03/01/2019   Procedure: CATARACT EXTRACTION PHACO AND INTRAOCULAR LENS PLACEMENT (IOC)  RIGHT;  Surgeon: Jaye Fallow, MD;  Location: [facility];  Service: Ophthalmology;  Laterality: Right;   COLONOSCOPY     COLONOSCOPY WITH PROPOFOL  N/A 06/27/2022   Procedure: COLONOSCOPY WITH PROPOFOL ;  Surgeon: Jinny Carmine, MD;  Location: [facility];  Service: Endoscopy;  Laterality: N/A;   COLONOSCOPY WITH PROPOFOL  N/A 10/06/2023   Procedure: COLONOSCOPY WITH PROPOFOL ;  Surgeon: Jinny Carmine, MD;  Location: [facility];  Service: Endoscopy;  Laterality: N/A;   COLOSTOMY REVISION N/A 11/13/2023   Procedure: REVISION, COLOSTOMY;  Surgeon: Jordis Laneta FALCON, MD;  Location: [facility];  Service: General;  Laterality: N/A;  ESOPHAGOGASTRODUODENOSCOPY (EGD) WITH PROPOFOL  N/A 06/27/2022   Procedure: ESOPHAGOGASTRODUODENOSCOPY (EGD) WITH PROPOFOL ;  Surgeon: Jinny Carmine, MD;  Location: [facility];  Service: Endoscopy;  Laterality: N/A;   PARTIAL COLECTOMY N/A 07/29/2022   Procedure: PARTIAL COLECTOMY, open left, RNFA to assist;  Surgeon: Jordis Laneta FALCON, MD;  Location: [facility];  Service: General;  Laterality: N/A;   POLYPECTOMY  10/06/2023   Procedure: POLYPECTOMY;  Surgeon: Jinny Carmine, MD;  Location: [facility];  Service: Endoscopy;;    Family History  Problem Relation Age of Onset   Mental  illness Mother        Dementia   Stroke Father    Lung cancer Brother    Breast cancer Other        dx under 56   Brain cancer Granddaughter 12       glioblastoma    Social History:  reports that she has never smoked. She has never been exposed to tobacco smoke. She has never used smokeless tobacco. She reports that she does not drink alcohol and does not use drugs.  Allergies:  Allergies  Allergen Reactions   Combigan  [Brimonidine  Tartrate-Timolol ]     Redness, burning, irritation    Lisinopril  Cough   Other Itching and Rash    SUTURES-(PARTIAL HYSTERECTOMY)    Medications reviewed.    ROS Full ROS performed and is otherwise negative other than what is stated in HPI   BP 130/75   Pulse 95   Ht 5' (1.524 m)   Wt 112 lb 3.2 oz (50.9 kg)   SpO2 98%   BMI 21.91 kg/m   Physical Exam   NAd chronically ill, walks independently  Abdomen: Soft, non-tender, non-distended, no rebound/guarding. Colostomy appliance removed along w eakin pouch, Ostomy in right abdominal wall, this is pink, patent and productive, does sit in a skin fold. To the left there is two pin point hole draining some feculent material, I was able to place a fresh Eakin pouch  Assessment/Plan: 88 yo with EC fistula , low output continues to improve. Advice pt about natural fiber in her diet. No need for hospitalization, continue eakin pouch I personally spent a total of 30 minutes in the care of the patient today including performing a medically appropriate exam/evaluation, counseling and educating, placing orders, referring and communicating with other health care professionals, documenting clinical information in the EHR, independently interpreting and reviewing images studies and coordinating care.   Laneta Jordis, MD [facility] General Surgeon

## 2024-02-15 NOTE — Telephone Encounter (Signed)
 Verbal orders were provided to Kristin Miller.

## 2024-02-16 DIAGNOSIS — R32 Unspecified urinary incontinence: Secondary | ICD-10-CM | POA: Diagnosis not present

## 2024-02-16 DIAGNOSIS — Z8701 Personal history of pneumonia (recurrent): Secondary | ICD-10-CM | POA: Diagnosis not present

## 2024-02-16 DIAGNOSIS — M1990 Unspecified osteoarthritis, unspecified site: Secondary | ICD-10-CM | POA: Diagnosis not present

## 2024-02-16 DIAGNOSIS — K651 Peritoneal abscess: Secondary | ICD-10-CM | POA: Diagnosis not present

## 2024-02-16 DIAGNOSIS — K9403 Colostomy malfunction: Secondary | ICD-10-CM | POA: Diagnosis not present

## 2024-02-16 DIAGNOSIS — M8580 Other specified disorders of bone density and structure, unspecified site: Secondary | ICD-10-CM | POA: Diagnosis not present

## 2024-02-16 DIAGNOSIS — K632 Fistula of intestine: Secondary | ICD-10-CM | POA: Diagnosis not present

## 2024-02-16 DIAGNOSIS — D649 Anemia, unspecified: Secondary | ICD-10-CM | POA: Diagnosis not present

## 2024-02-16 DIAGNOSIS — G43909 Migraine, unspecified, not intractable, without status migrainosus: Secondary | ICD-10-CM | POA: Diagnosis not present

## 2024-02-16 DIAGNOSIS — I10 Essential (primary) hypertension: Secondary | ICD-10-CM | POA: Diagnosis not present

## 2024-02-16 DIAGNOSIS — Z853 Personal history of malignant neoplasm of breast: Secondary | ICD-10-CM | POA: Diagnosis not present

## 2024-02-20 ENCOUNTER — Observation Stay
Admission: EM | Admit: 2024-02-20 | Discharge: 2024-02-22 | Disposition: A | Discharge status: Home / Self Care | Attending: Internal Medicine | Admitting: Internal Medicine

## 2024-02-20 DIAGNOSIS — Z933 Colostomy status: Secondary | ICD-10-CM | POA: Insufficient documentation

## 2024-02-20 DIAGNOSIS — Z9049 Acquired absence of other specified parts of digestive tract: Secondary | ICD-10-CM

## 2024-02-20 DIAGNOSIS — C189 Malignant neoplasm of colon, unspecified: Secondary | ICD-10-CM | POA: Insufficient documentation

## 2024-02-20 DIAGNOSIS — I10 Essential (primary) hypertension: Secondary | ICD-10-CM | POA: Diagnosis present

## 2024-02-20 DIAGNOSIS — K632 Fistula of intestine: Secondary | ICD-10-CM | POA: Diagnosis not present

## 2024-02-20 DIAGNOSIS — E876 Hypokalemia: Secondary | ICD-10-CM | POA: Diagnosis not present

## 2024-02-20 DIAGNOSIS — K922 Gastrointestinal hemorrhage, unspecified: Principal | ICD-10-CM | POA: Insufficient documentation

## 2024-02-20 DIAGNOSIS — K921 Melena: Secondary | ICD-10-CM | POA: Diagnosis not present

## 2024-02-20 DIAGNOSIS — K91858 Other complications of intestinal pouch: Secondary | ICD-10-CM | POA: Diagnosis present

## 2024-02-20 DIAGNOSIS — N179 Acute kidney failure, unspecified: Secondary | ICD-10-CM | POA: Diagnosis not present

## 2024-02-20 LAB — CBC
HCT: 34.7 % — ABNORMAL LOW (ref 36.0–46.0)
Hemoglobin: 10.7 g/dL — ABNORMAL LOW (ref 12.0–15.0)
MCH: 27.6 pg (ref 26.0–34.0)
MCHC: 30.8 g/dL (ref 30.0–36.0)
MCV: 89.7 fL (ref 80.0–100.0)
Platelets: 411 K/uL — ABNORMAL HIGH (ref 150–400)
RBC: 3.87 MIL/uL (ref 3.87–5.11)
RDW: 14.9 % (ref 11.5–15.5)
WBC: 10.1 K/uL (ref 4.0–10.5)
nRBC: 0 % (ref 0.0–0.2)

## 2024-02-20 NOTE — ED Triage Notes (Addendum)
 Patient was here in April for colon cancer. Today the patient presents with increased output from her surgical site and Eakin pouch. Husband feels like the wound has opened up more. Patient reports the area stings when it is being cleaned. Patient reports no concerns at the site of the stoma. Husband also reports the the surgical opening appears larger than it was a week ago.

## 2024-02-21 ENCOUNTER — Emergency Department

## 2024-02-21 ENCOUNTER — Other Ambulatory Visit: Payer: Self-pay

## 2024-02-21 DIAGNOSIS — R188 Other ascites: Secondary | ICD-10-CM | POA: Diagnosis not present

## 2024-02-21 DIAGNOSIS — E876 Hypokalemia: Secondary | ICD-10-CM | POA: Insufficient documentation

## 2024-02-21 DIAGNOSIS — R932 Abnormal findings on diagnostic imaging of liver and biliary tract: Secondary | ICD-10-CM | POA: Diagnosis not present

## 2024-02-21 DIAGNOSIS — K632 Fistula of intestine: Secondary | ICD-10-CM

## 2024-02-21 DIAGNOSIS — Z85038 Personal history of other malignant neoplasm of large intestine: Secondary | ICD-10-CM | POA: Diagnosis not present

## 2024-02-21 DIAGNOSIS — K921 Melena: Secondary | ICD-10-CM | POA: Diagnosis not present

## 2024-02-21 DIAGNOSIS — K449 Diaphragmatic hernia 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 gangrene: Secondary | ICD-10-CM | POA: Diagnosis not present

## 2024-02-21 LAB — COMPREHENSIVE METABOLIC PANEL WITH GFR
ALT: 7 U/L (ref 0–44)
AST: 17 U/L (ref 15–41)
Albumin: 3 g/dL — ABNORMAL LOW (ref 3.5–5.0)
Alkaline Phosphatase: 73 U/L (ref 38–126)
Anion gap: 11 (ref 5–15)
BUN: 10 mg/dL (ref 8–23)
CO2: 22 mmol/L (ref 22–32)
Calcium: 8.7 mg/dL — ABNORMAL LOW (ref 8.9–10.3)
Chloride: 109 mmol/L (ref 98–111)
Creatinine, Ser: 1.08 mg/dL — ABNORMAL HIGH (ref 0.44–1.00)
GFR, Estimated: 50 mL/min — ABNORMAL LOW (ref >60)
Glucose, Bld: 150 mg/dL — ABNORMAL HIGH (ref 70–99)
Potassium: 3.3 mmol/L — ABNORMAL LOW (ref 3.5–5.1)
Sodium: 142 mmol/L (ref 135–145)
Total Bilirubin: 0.5 mg/dL (ref 0.0–1.2)
Total Protein: 7 g/dL (ref 6.5–8.1)

## 2024-02-21 LAB — BASIC METABOLIC PANEL WITH GFR
Anion gap: 7 (ref 5–15)
BUN: 7 mg/dL — ABNORMAL LOW (ref 8–23)
CO2: 24 mmol/L (ref 22–32)
Calcium: 8.5 mg/dL — ABNORMAL LOW (ref 8.9–10.3)
Chloride: 111 mmol/L (ref 98–111)
Creatinine, Ser: 0.88 mg/dL (ref 0.44–1.00)
GFR, Estimated: >60 mL/min (ref >60)
Glucose, Bld: 123 mg/dL — ABNORMAL HIGH (ref 70–99)
Potassium: 3.3 mmol/L — ABNORMAL LOW (ref 3.5–5.1)
Sodium: 142 mmol/L (ref 135–145)

## 2024-02-21 LAB — CBC
HCT: 31.3 % — ABNORMAL LOW (ref 36.0–46.0)
Hemoglobin: 9.8 g/dL — ABNORMAL LOW (ref 12.0–15.0)
MCH: 28.1 pg (ref 26.0–34.0)
MCHC: 31.3 g/dL (ref 30.0–36.0)
MCV: 89.7 fL (ref 80.0–100.0)
Platelets: 365 K/uL (ref 150–400)
RBC: 3.49 MIL/uL — ABNORMAL LOW (ref 3.87–5.11)
RDW: 14.9 % (ref 11.5–15.5)
WBC: 8 K/uL (ref 4.0–10.5)
nRBC: 0 % (ref 0.0–0.2)

## 2024-02-21 LAB — LIPASE, BLOOD: Lipase: 39 U/L (ref 11–51)

## 2024-02-21 MED ORDER — ONDANSETRON HCL 4 MG/2ML IJ SOLN: 4.0000 mg | Freq: Four times a day (QID) | INTRAMUSCULAR | Status: DC | PRN

## 2024-02-21 MED ORDER — HYDROMORPHONE HCL 1 MG/ML IJ SOLN: 0.5000 mg | INTRAMUSCULAR | Status: DC | PRN

## 2024-02-21 MED ORDER — ONDANSETRON HCL 4 MG PO TABS: 4.0000 mg | ORAL_TABLET | Freq: Four times a day (QID) | ORAL | Status: DC | PRN

## 2024-02-21 MED ORDER — ACETAMINOPHEN 325 MG PO TABS: 650.0000 mg | ORAL_TABLET | Freq: Four times a day (QID) | ORAL | Status: DC | PRN

## 2024-02-21 MED ORDER — ACETAMINOPHEN 650 MG RE SUPP: 650.0000 mg | Freq: Four times a day (QID) | RECTAL | Status: DC | PRN

## 2024-02-21 MED ORDER — LACTATED RINGERS IV SOLN: INTRAVENOUS | Status: AC

## 2024-02-21 MED ORDER — HYDROCODONE-ACETAMINOPHEN 5-325 MG PO TABS: 1.0000 | ORAL_TABLET | ORAL | Status: DC | PRN

## 2024-02-21 MED ORDER — IOHEXOL 300 MG/ML  SOLN
75.0000 mL | Freq: Once | INTRAMUSCULAR | Status: AC | PRN
  Administered 2024-02-21: 75 mL via INTRAVENOUS

## 2024-02-21 NOTE — Assessment & Plan Note (Signed)
 Colostomy status Followed by oncology Colostomy output normal palpation

## 2024-02-21 NOTE — Progress Notes (Signed)
  Progress Note   Patient: Kristin Miller FMW:[id number] DOB: [birth year] DOA: 02/20/2024     0 DOS: the patient was seen and examined on 02/21/2024   Brief hospital course: Kristin Miller is a 88 y.o. female with medical history significant for Patient with a history of HTN, adenocarcinoma of the colon s/p colon resection now with colostomy with postoperative course complicated by enterocutaneous fistula with Eakin's bag being admitted with concerns for GI bleeding.     Principal Problem:   Hematochezia Active Problems:   Enterocutaneous fistula   Colon cancer S/P partial resection of colon   Colostomy present (HCC)   HTN (hypertension)   AKI (acute kidney injury) (HCC)   Hypokalemia   Black stools   Assessment and Plan: Enterocutaneous fistula Possible GI bleed Increased output from enterocutaneous fistula Patient is seen by general surgery, increased output from enterocutaneous fistula can happen intermittently, it is not a concern.  The drain from the fistula no longer looks black at this time. Will continue to monitor hemoglobin, most likely will discharge tomorrow.  Colon cancer S/P partial resection of colon Colostomy status Followed by oncology Colostomy output normal   Hypokalemia AKI (acute kidney injury) (HCC) Received IV fluids, continue replete potassium as needed.  Recheck her levels tomorrow.  HTN (hypertension) Continue home meds       Subjective: Patient doing well today, no abdominal pain or nausea vomiting  Physical Exam: Vitals:   02/21/24 0400 02/21/24 0430 02/21/24 0630 02/21/24 1211  BP: (!) 150/78 137/61 133/67 139/62  Pulse: 88 73 68 72  Resp: 16  16 17   Temp:   97.9 F (36.6 C) (!) 97.5 F (36.4 C)  TempSrc:   Oral Oral  SpO2: 100% 98% 97% 97%   General exam: Appears calm and comfortable  Respiratory system: Clear to auscultation. Respiratory effort normal. Cardiovascular system: S1 & S2 heard, RRR. No JVD, murmurs, rubs, gallops  or clicks. No pedal edema. Gastrointestinal system: Abdomen is nondistended, soft and nontender. No organomegaly or masses felt. Normal bowel sounds heard. Central nervous system: Alert and oriented. No focal neurological deficits. Extremities: Symmetric 5 x 5 power. Skin: No rashes, lesions or ulcers Psychiatry: Judgement and insight appear normal. Mood & affect appropriate.    Data Reviewed:  Lab results reviewed.  Family Communication: Husband updated at bedside  Disposition: Status is: Observation      Time spent: No charge minutes  Author: Murvin Mana, MD 02/21/2024 1:03 PM  For on call review [email or web address].

## 2024-02-21 NOTE — ED Provider Notes (Signed)
 [facility] Provider Note    Event Date/Time   First MD Initiated Contact with Patient 02/20/24 2335     (approximate)   History   Eakin's pouch leaking   HPI  Kristin Miller is a 88 y.o. female with history of colectomy with end colostomy complicated by necrosis of stoma requiring revision, developed small bowel fistula, has Eakin's pouch.  Presents because husband reports he is change the pouch 3 times a day but continues to have leaking around the pouch, he is also concerned because the color change from a greenish discharge to a darker discharge.  No fevers, no abdominal pain     Physical Exam   Triage Vital Signs: ED Triage Vitals  Encounter Vitals Group     BP 02/20/24 2333 (!) 141/81     Girls Systolic BP Percentile --      Girls Diastolic BP Percentile --      Boys Systolic BP Percentile --      Boys Diastolic BP Percentile --      Pulse Rate 02/20/24 2333 (!) 102     Resp 02/20/24 2333 18     Temp 02/20/24 2333 98.1 F (36.7 C)     Temp Source 02/20/24 2333 Oral     SpO2 02/20/24 2333 100 %     Weight --      Height --      Head Circumference --      Peak Flow --      Pain Score 02/20/24 2331 0     Pain Loc --      Pain Education --      Exclude from Growth Chart --     Most recent vital signs: Vitals:   02/21/24 0330 02/21/24 0400  BP: (!) 114/55 (!) 150/78  Pulse: 73 88  Resp:  16  Temp:    SpO2: 96% 100%     General: Awake, no distress.  CV:  Good peripheral perfusion.  Resp:  Normal effort.  Abd:  No distention.  Other:  Fluid leaking along the left lateral edge of the pouch where does not appear to be sealing well.  Grayish/brownish liquid, nonbloody   ED Results / Procedures / Treatments   Labs (all labs ordered are listed, but only abnormal results are displayed) Labs Reviewed  COMPREHENSIVE METABOLIC PANEL WITH GFR - Abnormal; Notable for the following components:      Result Value   Potassium 3.3 (*)     Glucose, Bld 150 (*)    Creatinine, Ser 1.08 (*)    Calcium 8.7 (*)    Albumin  3.0 (*)    GFR, Estimated 50 (*)    All other components within normal limits  CBC - Abnormal; Notable for the following components:   Hemoglobin 10.7 (*)    HCT 34.7 (*)    Platelets 411 (*)    All other components within normal limits  LIPASE, BLOOD     EKG     RADIOLOGY     PROCEDURES:  Critical Care performed:   Procedures   MEDICATIONS ORDERED IN ED: Medications  iohexol  (OMNIPAQUE ) 300 MG/ML solution 75 mL (75 mLs Intravenous Contrast Given 02/21/24 0220)     IMPRESSION / MDM / ASSESSMENT AND PLAN / ED COURSE  I reviewed the triage vital signs and the nursing notes. Patient's presentation is most consistent with acute complicated illness / injury requiring diagnostic workup.  Patient presents with leaking pouch as above, there appears to  be more discharge today than typical but the husband is concerned mainly about the leaking and the change in color.  Does not appear melanotic or bloody.  The leaking appears to be from poor seal on the left lateral edge of the pouch.  I have asked the nurse to replace  Lab work reviewed and is overall reassuring.  Fluid from Eakin's pouch is immediately guaiac positive most consistent with likely GI bleed.  She is not on blood thinners.  Will obtain CT abdomen pelvis given complex surgical history  CT scan without acute surgical abnormality.  Will discuss with the hospitalist for admission      FINAL CLINICAL IMPRESSION(S) / ED DIAGNOSES   Final diagnoses:  Gastrointestinal hemorrhage, unspecified gastrointestinal hemorrhage type     Rx / DC Orders   ED Discharge Orders     None        Note:  This document was prepared using Dragon voice recognition software and may include unintentional dictation errors.   Arlander Charleston, MD 02/21/24 [phone]

## 2024-02-21 NOTE — Assessment & Plan Note (Signed)
 Pharmacy consult for electrolyte management

## 2024-02-21 NOTE — Consult Note (Signed)
 SURGICAL CONSULTATION NOTE   HISTORY OF PRESENT ILLNESS (HPI):  88 y.o. female presented to [facility] ED for evaluation of worsening output through the enterocutaneous fistula.   Patient reports that in the last few days she has been having more copious drainage through the fistula.  It was also concerning that the output was dark almost black.  The husband said that he yesterday she needed to change the Eakin pouch 3-4 times in few hours due to significant leakage.  She denies any significant pain.  She denies any nausea or vomiting.  No pain radiation.  She has not identified any alleviating or aggravating factors.  She denies any fever.  At the ER she was found with erythema surrounding the fistula wound.  There is no fever.  Stable vital signs.  The white blood cell count is 8.0 and the hemoglobin is 9.8.  She had a CT scan of the abdomen pelvis that shows a gas/fluid collection in the anterior abdomen at the location of the enterocutaneous fistula.  The fluid collection measures up to 6.6 cm.  I compared the CT scan with the last CT scan done in January 18, 2024 where she had that collation up to 8 cm.  She was seen by her primary surgeon at his clinic on 02/01/2024 and no changes were done at that time.  Surgery is consulted by Dr. Laurita in this context for evaluation and management of increased output through the enterocutaneous fistula.  PAST MEDICAL HISTORY (PMH):  Past Medical History:  Diagnosis Date   Allergy    Anemia    H/O AS A CHILD   Arthritis    Back pain    Breast cancer of upper-outer quadrant of right female breast (HCC) 06/12/2015   4 millimeter, T1a, N0; ER 90%, PR 0%, HER-2/neu not overexpressed.  Declined radiation therapy.   Collar bone fracture 05/28/2013   Fractured Collar bone on left   Dyspnea    Dysrhythmia    h/o heart skipping beat years ago   Frequent headaches    h/o migraines   Hypertension    Osteopenia 04/2018   Bone density without significant interval  change since 2017.   Pneumonia      PAST SURGICAL HISTORY [facility]):  Past Surgical History:  Procedure Laterality Date   ABDOMINAL HYSTERECTOMY  1970   Partial   BREAST BIOPSY Right 05/28/2015   INVASIVE MAMMARY CARCINOMA    BREAST BIOPSY Right 06/12/2015   Wide excision, sentinel lymph node biopsy.   BREAST EXCISIONAL BIOPSY Right    BREAST LUMPECTOMY WITH AXILLARY LYMPH NODE DISSECTION Right 06/12/2015   4 mm, T1a,N0 withDCIS with clear margins. ER: 90%, PR 0%; Her 2 neu not overexpressed.    CATARACT EXTRACTION W/PHACO Left 02/03/2017   Procedure: CATARACT EXTRACTION PHACO AND INTRAOCULAR LENS PLACEMENT (IOC);  Surgeon: Jaye Fallow, MD;  Location: [facility] ORS;  Service: Ophthalmology;  Laterality: Left;  US   00:28 AP% 16.3 CDE 4.68 Fluid pack lot # [id number]   CATARACT EXTRACTION W/PHACO Right 03/01/2019   Procedure: CATARACT EXTRACTION PHACO AND INTRAOCULAR LENS PLACEMENT (IOC)  RIGHT;  Surgeon: Jaye Fallow, MD;  Location: [facility];  Service: Ophthalmology;  Laterality: Right;   COLONOSCOPY     COLONOSCOPY WITH PROPOFOL  N/A 06/27/2022   Procedure: COLONOSCOPY WITH PROPOFOL ;  Surgeon: Jinny Carmine, MD;  Location: [facility];  Service: Endoscopy;  Laterality: N/A;   COLONOSCOPY WITH PROPOFOL  N/A 10/06/2023   Procedure: COLONOSCOPY WITH PROPOFOL ;  Surgeon: Jinny Carmine, MD;  Location: [facility] ENDOSCOPY;  Service: Endoscopy;  Laterality: N/A;   COLOSTOMY REVISION N/A 11/13/2023   Procedure: REVISION, COLOSTOMY;  Surgeon: Jordis Laneta FALCON, MD;  Location: [facility] ORS;  Service: General;  Laterality: N/A;   ESOPHAGOGASTRODUODENOSCOPY (EGD) WITH PROPOFOL  N/A 06/27/2022   Procedure: ESOPHAGOGASTRODUODENOSCOPY (EGD) WITH PROPOFOL ;  Surgeon: Jinny Carmine, MD;  Location: [facility];  Service: Endoscopy;  Laterality: N/A;   PARTIAL COLECTOMY N/A 07/29/2022   Procedure: PARTIAL COLECTOMY, open left, RNFA to assist;  Surgeon: Jordis Laneta FALCON, MD;  Location: [facility] ORS;   Service: General;  Laterality: N/A;   POLYPECTOMY  10/06/2023   Procedure: POLYPECTOMY;  Surgeon: Jinny Carmine, MD;  Location: [facility] ENDOSCOPY;  Service: Endoscopy;;     MEDICATIONS:  Prior to Admission medications   Medication Sig Start Date End Date Taking? Authorizing Provider  acetaminophen  (TYLENOL ) 500 MG tablet Take 500 mg by mouth every 6 (six) hours as needed for pain.   Yes [provider]  amLODipine  (NORVASC ) 5 MG tablet Take 1 tablet (5 mg total) by mouth daily. Patient taking differently: Take 5 mg by mouth at bedtime. 07/28/23  Yes Ziglar, Susan K, MD  latanoprost  (XALATAN ) 0.005 % ophthalmic solution 1 drop at bedtime. Eye doctor both eyes 12/18/20  Yes [provider]  mirtazapine  (REMERON ) 7.5 MG tablet Take 1 tablet (7.5 mg total) by mouth at bedtime. 02/01/24  Yes Ziglar, Susan K, MD  ondansetron  (ZOFRAN ) 4 MG tablet Take 1 tablet (4 mg total) by mouth every 8 (eight) hours as needed for nausea or vomiting. 02/01/24  Yes Ziglar, Susan K, MD  Zinc 50 MG TABS Take 1 tablet by mouth daily at 6 (six) AM.   Yes [provider]  alendronate  (FOSAMAX ) 70 MG tablet Take 1 tablet (70 mg total) by mouth every 7 (seven) days for 48 doses. Take with a full glass of water  on an empty stomach. Patient not taking: Reported on 02/21/2024 07/21/23 06/15/24  Ziglar, Susan K, MD  losartan -hydrochlorothiazide  (HYZAAR) 100-12.5 MG tablet Take 1 tablet by mouth daily. Patient not taking: Reported on 02/21/2024 07/21/23   Ziglar, Devere POUR, MD     ALLERGIES:  Allergies  Allergen Reactions   Combigan  [Brimonidine  Tartrate-Timolol ]     Redness, burning, irritation    Lisinopril  Cough   Other Itching and Rash    SUTURES-(PARTIAL HYSTERECTOMY)     SOCIAL HISTORY:  Social History   Socioeconomic History   Marital status: Married    Spouse name: ,Maranda   Number of children: 3   Years of education: GED   Highest education level: 12th grade  Occupational History    Occupation: Retired  Tobacco Use   Smoking status: Never    Passive exposure: Never   Smokeless tobacco: Never   Tobacco comments:    smoking cessation materials not required  Vaping Use   Vaping status: Never Used  Substance and Sexual Activity   Alcohol use: No   Drug use: No   Sexual activity: Not Currently  Other Topics Concern   Not on file  Social History Narrative   Not on file   Social Drivers of Health   Financial Resource Strain: Low Risk  (01/09/2023)   Received from [facility]   Overall Financial Resource Strain (CARDIA)    Difficulty of Paying Living Expenses: Not very hard  Food Insecurity: No Food Insecurity (11/14/2023)   Hunger Vital Sign    Worried About Running Out of Food in the Last Year: Never  true    Ran Out of Food in the Last Year: Never true  Transportation Needs: No Transportation Needs (11/14/2023)   PRAPARE - Administrator, Civil Service (Medical): No    Lack of Transportation (Non-Medical): No  Physical Activity: Inactive (09/25/2021)   Exercise Vital Sign    Days of Exercise per Week: 0 days    Minutes of Exercise per Session: 0 min  Stress: No Stress Concern Present (09/25/2021)   [employer] of Occupational Health - Occupational Stress Questionnaire    Feeling of Stress : Not at all  Social Connections: Moderately Integrated (11/14/2023)   Social Connection and Isolation Panel    Frequency of Communication with Friends and Family: More than three times a week    Frequency of Social Gatherings with Friends and Family: More than three times a week    Attends Religious Services: More than 4 times per year    Active Member of [employer] or Organizations: No    Attends Banker Meetings: Never    Marital Status: Married  Catering manager Violence: Not At Risk (11/14/2023)   Humiliation, Afraid, Rape, and Kick questionnaire    Fear of Current or Ex-Partner: No    Emotionally Abused: No    Physically  Abused: No    Sexually Abused: No      FAMILY HISTORY:  Family History  Problem Relation Age of Onset   Mental illness Mother        Dementia   Stroke Father    Lung cancer Brother    Breast cancer Other        dx under 50   Brain cancer Granddaughter 12       glioblastoma     REVIEW OF SYSTEMS:  Constitutional: denies weight loss, fever, chills, or sweats  Eyes: denies any other vision changes, history of eye injury  ENT: denies sore throat, hearing problems  Respiratory: denies shortness of breath, wheezing  Cardiovascular: denies chest pain, palpitations  Gastrointestinal: abdominal pain Genitourinary: denies burning with urination or urinary frequency Musculoskeletal: denies any other joint pains or cramps  Skin: denies any other rashes or skin discolorations  Neurological: denies any other headache, dizziness, weakness  Psychiatric: denies any other depression, anxiety   All other review of systems were negative   VITAL SIGNS:  Temp:  [97.9 F (36.6 C)-98.1 F (36.7 C)] 97.9 F (36.6 C) (08/03 0630) Pulse Rate:  [68-102] 68 (08/03 0630) Resp:  [16-18] 16 (08/03 0630) BP: (108-150)/(48-81) 133/67 (08/03 0630) SpO2:  [96 %-100 %] 97 % (08/03 0630)             INTAKE/OUTPUT:  This shift: No intake/output data recorded.  Last 2 shifts: @IOLAST2SHIFTS @   PHYSICAL EXAM:  Constitutional:  -- Normal body habitus  -- Awake, alert, and oriented x3  Eyes:  -- Pupils equally round and reactive to light  -- No scleral icterus  Ear, nose, and throat:  -- No jugular venous distension  Pulmonary:  -- No crackles  -- Equal breath sounds bilaterally -- Breathing non-labored at rest Cardiovascular:  -- S1, S2 present  -- No pericardial rubs Gastrointestinal:  -- Abdomen soft, nontender, non-distended, no guarding or rebound tenderness -- About 9 cm x 5 cm enterocutaneous fistula wound with stool output.  Surrounding erythema. Musculoskeletal and Integumentary:  --  Extremities: B/L UE and LE FROM, hands and feet warm, no edema  Neurologic:  -- Motor function: intact and symmetric -- Sensation: intact and symmetric  Labs:     Latest Ref Rng & Units 02/21/2024    6:05 AM 02/20/2024   11:33 PM 01/26/2024   10:15 AM  CBC  WBC 4.0 - 10.5 K/uL 8.0  10.1  11.9   Hemoglobin 12.0 - 15.0 g/dL 9.8  89.2  88.7   Hematocrit 36.0 - 46.0 % 31.3  34.7  35.8   Platelets 150 - 400 K/uL 365  411  370       Latest Ref Rng & Units 02/20/2024   11:33 PM 01/26/2024   10:15 AM 12/15/2023    9:00 AM  CMP  Glucose 70 - 99 mg/dL 849  859  876   BUN 8 - 23 mg/dL 10  18  12    Creatinine 0.44 - 1.00 mg/dL 8.91  8.72  9.41   Sodium 135 - 145 mmol/L 142  135  138   Potassium 3.5 - 5.1 mmol/L 3.3  3.5  3.3   Chloride 98 - 111 mmol/L 109  98  105   CO2 22 - 32 mmol/L 22  25  24    Calcium 8.9 - 10.3 mg/dL 8.7  8.7  7.8   Total Protein 6.5 - 8.1 g/dL 7.0  7.5    Total Bilirubin 0.0 - 1.2 mg/dL 0.5  0.5    Alkaline Phos 38 - 126 U/L 73  90    AST 15 - 41 U/L 17  20    ALT 0 - 44 U/L 7  12      Imaging studies: I personally evaluated these images of the CT scan EXAM: CT ABDOMEN AND PELVIS WITH CONTRAST 02/21/2024 02:25:54 AM   TECHNIQUE: CT of the abdomen and pelvis was performed with the administration of intravenous contrast. Multiplanar reformatted images are provided for review. Automated exposure control, iterative reconstruction, and/or weight based adjustment of the mA/kV was utilized to reduce the radiation dose to as low as reasonably achievable.   COMPARISON: CT from 01/18/2024.   CLINICAL HISTORY: Increased fistula output, guiac positive. Patient was here in April for colon cancer. Today the patient presents with increased output from her surgical site and Eakin pouch. Husband feels like the wound has opened up more. Patient reports the area stings when it is being cleaned. Patient reports no concerns at the site of the stoma. Husband also reports the  surgical opening appears larger than it was a week ago.   FINDINGS:   LOWER CHEST: Moderate hiatal hernia.   LIVER: The liver is unremarkable.   GALLBLADDER AND BILE DUCTS: Gallbladder wall hyperemia and trace pericholecystic fluid.   SPLEEN: No acute abnormality.   PANCREAS: No acute abnormality.   ADRENAL GLANDS: Stable.   KIDNEYS, URETERS AND BLADDER: No urinary calculi or hydronephrosis. Urinary bladder is unremarkable.   GI AND BOWEL: Subtotal colectomy with Hartmann pouch and residual cecum in the right lower quadrant. Inflammatory thickening of the residual cecum and small bowel in the right lower quadrant as well as the bowel in the colostomy. Similar inflammation of the small bowel in the left abdomen. Wall thickening about the stomach in the hiatal hernia. No bowel obstruction.   PERITONEUM AND RETROPERITONEUM: Gas and fluid collection in the anterior peritoneum measures 6.6 x 2.1 cm, previously 8.2 x 2.8 cm. There are multiple fistulous tracts extending from the gas and fluid collection through the midline anterior abdominal wall to the skin surface. On Jan 18 2024 this contained leaked enteric contrast from the small bowel compatible with enterocutaneous fistula. Adjacent stranding  in the right lower quadrant as well as in the subcutaneous fat of the lower abdomen.   Decreased rim enhancing fluid collection inferior to the right hepatic lobe measuring 1.7 cm ( series 2 image 33 ).   The previous fluid collection in the left upper quadrant anterior to the greater curvature of the stomach as nearly resolved and now measures approximately 1.5 cm ( series 2 image 23 ).   VASCULATURE: Aorta is normal in caliber.   LYMPH NODES: No lymphadenopathy.   REPRODUCTIVE ORGANS: No acute abnormality.   BONES AND SOFT TISSUES: No acute osseous abnormality. Vertebra plana of T12, chronic.   IMPRESSION: 1. Gas and fluid collection in the anterior peritoneum with  multiple fistulous tracts extending to the skin surface, compatible with enterocutaneous fistula. The collection measures 6.6 x 2.1 cm, previously 8.2 x 2.8 cm. 2. Similar enterocolitis involving the small bowel in the left abdomen and small bowel and cecum in the anterior right abdomen 3. Decreased rim enhancing fluid collections inferior to the right hepatic lobe and in the left upper quadrant. 4. Gallbladder wall hyperemia and trace pericholecystic fluid, favored reactive . 5. Moderate hiatal hernia with inflammation of the stomach wall in the hernia.   Electronically signed by: Norman Gatlin MD 02/21/2024 03:03 AM EDT RP Workstation: [id number]  Assessment/Plan:  88 y.o. 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  Enterocutaneous fistula - I think that her issue is more the difficulty with wound care with significant leak from the current bag and some erythema surrounding the skin - The CT scan shows smaller fluid collection compared to last CT scan. - The change in output from the fistula is not uncommon to have some days with higher output.  Still not considered high output fistula - I do not think that patient is septic and need any urgent/emergent intervention - Will consider with primary surgeon if draining the collection will help controlling the output of the skin until there is a better healing and a better control with current bag.   Lucas Petrin, MD

## 2024-02-21 NOTE — H&P (Signed)
 History and Physical    Patient: Kristin Miller FMW:[id number] DOB: [birth year] DOA: 02/20/2024 DOS: the patient was seen and examined on 02/21/2024 PCP: Ziglar, Susan K, MD  Patient coming from: Home  Chief Complaint:  Chief Complaint  Patient presents with   Follow-up    HPI: Kristin Miller is a 88 y.o. female with medical history significant for Patient with a history of HTN, adenocarcinoma of the colon s/p colon resection now with colostomy with postoperative course complicated by enterocutaneous fistula with Eakin's bag being admitted with concerns for GI bleeding.  Husband at bedside who gives a history states that they recently noticed increased output into the Eakin's bag over the past few days and in the few hours prior to arrival there was a color change from the usual yellow to black. Of note, patient was seen by her surgeon a few days prior on 02/15/2024 when things appeared to be going along smoothly. Patient denies abdominal pain, fever or chills, chest pain, shortness of breath or palpitations or lightheadedness.  She has had no vomiting. In the ED, mildly tachycardic on arrival to 102 with otherwise normal vitals.  Hemoglobin at baseline at 10.7.  Creatinine 1.08, up from 0.58 a couple months prior.  Potassium 3.3.   Guaiac positive CT abdomen and pelvis showing similar findings to CT done in June with multiple fistulous tracts extending to the skin surface, and a gas and fluid collection in the anterior peritoneum slightly reduced from prior-please see report. Admission requested     Review of Systems: As mentioned in the history of present illness. All other systems reviewed and are negative.  Past Medical History:  Diagnosis Date   Allergy    Anemia    H/O AS A CHILD   Arthritis    Back pain    Breast cancer of upper-outer quadrant of right female breast (HCC) 06/12/2015   4 millimeter, T1a, N0; ER 90%, PR 0%, HER-2/neu not overexpressed.  Declined radiation  therapy.   Collar bone fracture 05/28/2013   Fractured Collar bone on left   Dyspnea    Dysrhythmia    h/o heart skipping beat years ago   Frequent headaches    h/o migraines   Hypertension    Osteopenia 04/2018   Bone density without significant interval change since 2017.   Pneumonia    Past Surgical History:  Procedure Laterality Date   ABDOMINAL HYSTERECTOMY  1970   Partial   BREAST BIOPSY Right 05/28/2015   INVASIVE MAMMARY CARCINOMA    BREAST BIOPSY Right 06/12/2015   Wide excision, sentinel lymph node biopsy.   BREAST EXCISIONAL BIOPSY Right    BREAST LUMPECTOMY WITH AXILLARY LYMPH NODE DISSECTION Right 06/12/2015   4 mm, T1a,N0 withDCIS with clear margins. ER: 90%, PR 0%; Her 2 neu not overexpressed.    CATARACT EXTRACTION W/PHACO Left 02/03/2017   Procedure: CATARACT EXTRACTION PHACO AND INTRAOCULAR LENS PLACEMENT (IOC);  Surgeon: Jaye Fallow, MD;  Location: [facility];  Service: Ophthalmology;  Laterality: Left;  US   00:28 AP% 16.3 CDE 4.68 Fluid pack lot # [id number]   CATARACT EXTRACTION W/PHACO Right 03/01/2019   Procedure: CATARACT EXTRACTION PHACO AND INTRAOCULAR LENS PLACEMENT (IOC)  RIGHT;  Surgeon: Jaye Fallow, MD;  Location: [facility];  Service: Ophthalmology;  Laterality: Right;   COLONOSCOPY     COLONOSCOPY WITH PROPOFOL  N/A 06/27/2022   Procedure: COLONOSCOPY WITH PROPOFOL ;  Surgeon: Jinny Carmine, MD;  Location: [facility];  Service: Endoscopy;  Laterality: N/A;  COLONOSCOPY WITH PROPOFOL  N/A 10/06/2023   Procedure: COLONOSCOPY WITH PROPOFOL ;  Surgeon: Jinny Carmine, MD;  Location: [facility];  Service: Endoscopy;  Laterality: N/A;   COLOSTOMY REVISION N/A 11/13/2023   Procedure: REVISION, COLOSTOMY;  Surgeon: Jordis Laneta FALCON, MD;  Location: [facility];  Service: General;  Laterality: N/A;   ESOPHAGOGASTRODUODENOSCOPY (EGD) WITH PROPOFOL  N/A 06/27/2022   Procedure: ESOPHAGOGASTRODUODENOSCOPY (EGD) WITH PROPOFOL ;  Surgeon: Jinny Carmine, MD;  Location: [facility];  Service: Endoscopy;  Laterality: N/A;   PARTIAL COLECTOMY N/A 07/29/2022   Procedure: PARTIAL COLECTOMY, open left, RNFA to assist;  Surgeon: Jordis Laneta FALCON, MD;  Location: [facility];  Service: General;  Laterality: N/A;   POLYPECTOMY  10/06/2023   Procedure: POLYPECTOMY;  Surgeon: Jinny Carmine, MD;  Location: [facility] END[facility];  Service: Endoscopy;;   Social History:  reports that she has never smoked. She has never been exposed to tobacco smoke. She has never used smokeless tobacco. She reports that she does not drink alcohol and does not use drugs.  Allergies  Allergen Reactions   Combigan  [Brimonidine  Tartrate-Timolol ]     Redness, burning, irritation    Lisinopril  Cough   Other Itching and Rash    SUTURES-(PARTIAL HYSTERECTOMY)    Family History  Problem Relation Age of Onset   Mental illness Mother        Dementia   Stroke Father    Lung cancer Brother    Breast cancer Other        dx under 10   Brain cancer Granddaughter 12       glioblastoma    Prior to Admission medications   Medication Sig Start Date End Date Taking? Authorizing Provider  acetaminophen  (TYLENOL ) 500 MG tablet Take 500 mg by mouth every 6 (six) hours as needed for pain.    [provider]  alendronate  (FOSAMAX ) 70 MG tablet Take 1 tablet (70 mg total) by mouth every 7 (seven) days for 48 doses. Take with a full glass of water  on an empty stomach. 07/21/23 06/15/24  Ziglar, Susan K, MD  amLODipine  (NORVASC ) 5 MG tablet Take 1 tablet (5 mg total) by mouth daily. 07/28/23   Ziglar, Susan K, MD  latanoprost  (XALATAN ) 0.005 % ophthalmic solution 1 drop at bedtime. Eye doctor both eyes 12/18/20   [provider]  losartan -hydrochlorothiazide  (HYZAAR) 100-12.5 MG tablet Take 1 tablet by mouth daily. 07/21/23   Ziglar, Susan K, MD  mirtazapine  (REMERON ) 7.5 MG tablet Take 1 tablet (7.5 mg total) by mouth at bedtime. 02/01/24   Ziglar, Susan K, MD   ondansetron  (ZOFRAN ) 4 MG tablet Take 1 tablet (4 mg total) by mouth every 8 (eight) hours as needed for nausea or vomiting. 02/01/24   Ziglar, Susan K, MD  Zinc 50 MG TABS Take 1 tablet by mouth daily at 6 (six) AM.    [provider]    Physical Exam: Vitals:   02/21/24 0300 02/21/24 0330 02/21/24 0400 02/21/24 0430  BP: 108/60 (!) 114/55 (!) 150/78 137/61  Pulse: 73 73 88 73  Resp:   16   Temp:      TempSrc:      SpO2: 96% 96% 100% 98%   Physical Exam Vitals and nursing note reviewed.  Constitutional:      General: She is not in acute distress. HENT:     Head: Normocephalic and atraumatic.  Cardiovascular:     Rate and Rhythm: Normal rate and regular rhythm.     Heart sounds: Normal heart  sounds.  Pulmonary:     Effort: Pulmonary effort is normal.     Breath sounds: Normal breath sounds.  Abdominal:     Palpations: Abdomen is soft.     Tenderness: There is no abdominal tenderness.     Comments: Colostomy right lower quadrant  Eakin's bag left lower quadrant with darkish green output   Neurological:     Mental Status: Mental status is at baseline.     Labs on Admission: I have personally reviewed following labs and imaging studies  CBC: Recent Labs  Lab 02/20/24 2333  WBC 10.1  HGB 10.7*  HCT 34.7*  MCV 89.7  PLT 411*   Basic Metabolic Panel: Recent Labs  Lab 02/20/24 2333  NA 142  K 3.3*  CL 109  CO2 22  GLUCOSE 150*  BUN 10  CREATININE 1.08*  CALCIUM 8.7*   GFR: Estimated Creatinine Clearance: 26.9 mL/min (A) (by C-G formula based on SCr of 1.08 mg/dL (H)). Liver Function Tests: Recent Labs  Lab 02/20/24 2333  AST 17  ALT 7  ALKPHOS 73  BILITOT 0.5  PROT 7.0  ALBUMIN  3.0*   Recent Labs  Lab 02/20/24 2333  LIPASE 39   No results for input(s): AMMONIA in the last 168 hours. Coagulation Profile: No results for input(s): INR, PROTIME in the last 168 hours. Cardiac Enzymes: No results for input(s): CKTOTAL, CKMB,  CKMBINDEX, TROPONINI in the last 168 hours. BNP (last 3 results) No results for input(s): PROBNP in the last 8760 hours. HbA1C: No results for input(s): HGBA1C in the last 72 hours. CBG: No results for input(s): GLUCAP in the last 168 hours. Lipid Profile: No results for input(s): CHOL, HDL, LDLCALC, TRIG, CHOLHDL, LDLDIRECT in the last 72 hours. Thyroid  Function Tests: No results for input(s): TSH, T4TOTAL, FREET4, T3FREE, THYROIDAB in the last 72 hours. Anemia Panel: No results for input(s): VITAMINB12, FOLATE, FERRITIN, TIBC, IRON , RETICCTPCT in the last 72 hours. Urine analysis:    Component Value Date/Time   COLORURINE YELLOW 12/12/2021 1418   APPEARANCEUR CLEAR 12/12/2021 1418   LABSPEC 1.015 12/12/2021 1418   PHURINE 8.5 (H) 12/12/2021 1418   GLUCOSEU NEGATIVE 12/12/2021 1418   HGBUR NEGATIVE 12/12/2021 1418   BILIRUBINUR negative 02/17/2022 1518   KETONESUR NEGATIVE 12/12/2021 1418   PROTEINUR Negative 02/17/2022 1518   PROTEINUR NEGATIVE 12/12/2021 1418   UROBILINOGEN 0.2 02/17/2022 1518   NITRITE negative 02/17/2022 1518   NITRITE NEGATIVE 12/12/2021 1418   LEUKOCYTESUR Negative 02/17/2022 1518   LEUKOCYTESUR NEGATIVE 12/12/2021 1418    Radiological Exams on Admission: CT ABDOMEN PELVIS W CONTRAST Result Date: 02/21/2024 EXAM: CT ABDOMEN AND PELVIS WITH CONTRAST 02/21/2024 02:25:54 AM TECHNIQUE: CT of the abdomen and pelvis was performed with the administration of intravenous contrast. Multiplanar reformatted images are provided for review. Automated exposure control, iterative reconstruction, and/or weight based adjustment of the mA/kV was utilized to reduce the radiation dose to as low as reasonably achievable. COMPARISON: CT from 01/18/2024. CLINICAL HISTORY: Increased fistula output, guiac positive. Patient was here in April for colon cancer. Today the patient presents with increased output from her surgical site and Eakin  pouch. Husband feels like the wound has opened up more. Patient reports the area stings when it is being cleaned. Patient reports no concerns at the site of the stoma. Husband also reports the surgical opening appears larger than it was a week ago. FINDINGS: LOWER CHEST: Moderate hiatal hernia. LIVER: The liver is unremarkable. GALLBLADDER AND BILE DUCTS: Gallbladder wall hyperemia and  trace pericholecystic fluid. SPLEEN: No acute abnormality. PANCREAS: No acute abnormality. ADRENAL GLANDS: Stable. KIDNEYS, URETERS AND BLADDER: No urinary calculi or hydronephrosis. Urinary bladder is unremarkable. GI AND BOWEL: Subtotal colectomy with Hartmann pouch and residual cecum in the right lower quadrant. Inflammatory thickening of the residual cecum and small bowel in the right lower quadrant as well as the bowel in the colostomy. Similar inflammation of the small bowel in the left abdomen. Wall thickening about the stomach in the hiatal hernia. No bowel obstruction. PERITONEUM AND RETROPERITONEUM: Gas and fluid collection in the anterior peritoneum measures 6.6 x 2.1 cm, previously 8.2 x 2.8 cm. There are multiple fistulous tracts extending from the gas and fluid collection through the midline anterior abdominal wall to the skin surface. On Jan 18 2024 this contained leaked enteric contrast from the small bowel compatible with enterocutaneous fistula. Adjacent stranding in the right lower quadrant as well as in the subcutaneous fat of the lower abdomen. Decreased rim enhancing fluid collection inferior to the right hepatic lobe measuring 1.7 cm ( series 2 image 33 ). The previous fluid collection in the left upper quadrant anterior to the greater curvature of the stomach as nearly resolved and now measures approximately 1.5 cm ( series 2 image 23 ). VASCULATURE: Aorta is normal in caliber. LYMPH NODES: No lymphadenopathy. REPRODUCTIVE ORGANS: No acute abnormality. BONES AND SOFT TISSUES: No acute osseous abnormality.  Vertebra plana of T12, chronic. IMPRESSION: 1. Gas and fluid collection in the anterior peritoneum with multiple fistulous tracts extending to the skin surface, compatible with enterocutaneous fistula. The collection measures 6.6 x 2.1 cm, previously 8.2 x 2.8 cm. 2. Similar enterocolitis involving the small bowel in the left abdomen and small bowel and cecum in the anterior right abdomen 3. Decreased rim enhancing fluid collections inferior to the right hepatic lobe and in the left upper quadrant. 4. Gallbladder wall hyperemia and trace pericholecystic fluid, favored reactive . 5. Moderate hiatal hernia with inflammation of the stomach wall in the hernia. Electronically signed by: Norman Gatlin MD 02/21/2024 03:03 AM EDT RP Workstation: [id number]   Data Reviewed for HPI: Relevant notes from primary care and specialist visits, past discharge summaries as available in EHR, including Care Everywhere. Prior diagnostic testing as pertinent to current admission diagnoses Updated medications and problem lists for reconciliation ED course, including vitals, labs, imaging, treatment and response to treatment Triage notes, nursing and pharmacy notes and ED provider's notes Notable results as noted above in HPI      Assessment and Plan: Enterocutaneous fistula Possible GI bleed Increased output from enterocutaneous fistula Guaiac positive output Serial H&H Surgical consult Ostomy care  Colon cancer S/P partial resection of colon Colostomy status Followed by oncology Colostomy output normal palpation  Hypokalemia Pharmacy consult for electrolyte management  AKI (acute kidney injury) (HCC) Creatinine 1.08 up from baseline of 0.58 IV hydration and monitor.  Avoid nephrotoxins  HTN (hypertension) Continue home meds        DVT prophylaxis: SCD due to possible GI bleed  Consults: Surgery  Advance Care Planning:   Code Status: Full Code   Family Communication: Husband at  bedside  Disposition Plan: Back to previous home environment  Severity of Illness: The appropriate patient status for this patient is OBSERVATION. Observation status is judged to be reasonable and necessary in order to provide the required intensity of service to ensure the patient's safety. The patient's presenting symptoms, physical exam findings, and initial radiographic and laboratory data in the context of their  medical condition is felt to place them at decreased risk for further clinical deterioration. Furthermore, it is anticipated that the patient will be medically stable for discharge from the hospital within 2 midnights of admission.   Author: Delayne LULLA Solian, MD 02/21/2024 5:34 AM  For on call review [email or web address].

## 2024-02-21 NOTE — Hospital Course (Signed)
 Kristin Miller is a 88 y.o. female with medical history significant for Patient with a history of HTN, adenocarcinoma of the colon s/p colon resection now with colostomy with postoperative course complicated by enterocutaneous fistula with Eakin's bag being admitted with concerns for GI bleeding.   Monitored in the hospital, seen by general surgery, the output from the fistula is slowing down.  Most importantly, no longer has any black stools.  Her hemoglobin has been stable.  At this point, she is medically stable for discharge.

## 2024-02-21 NOTE — Assessment & Plan Note (Signed)
 Continue home meds

## 2024-02-21 NOTE — ED Notes (Signed)
 This tech assisted pt to bedside toilet and changed pt bed sheets. This tech offered pt a hospital gown but pt declined at this time.

## 2024-02-21 NOTE — Consult Note (Addendum)
 This patient is known to [facility] team from previous admission after ostomy revision 11/13/2023; last seen in person 12/14/2023.  At that time using 2 1/4 flat 2 piece system to colostomy stoma that was described as 1 1/2 red and well budded.  WOC team also worked with patient and husband on placing Eakin pouch to fistulous openings. Using small Eakin pouch to midline fistula at that visit.    Will provide [employer] numbers for materials and instructions on crusting of skin at this time.  If skin is denuded skin should be cleaned well with soap and water , dried and crusted with no sting barrier wipe and stoma powder prior to attempting to place Eakins pouch over fistulous openings.   For colostomy use 2 1/4 skin barrier Soila [phone]) , 2 1/4 pouch Soila [phone]) and 2 barrier ring Soila [phone]).    For fistula use small Eakin pouch Gerlean [phone], if small not available Gerlean #for medium Eakin [phone].   For crusting: Sprinkle stoma powder Soila #6) over any irritated skin, brush away excess powder Tap lightly over the powder with Cavilon No-Sting barrier wipe (skin barrier wipe-white and blue package) Allow to dry Apply another layer of powder following the same steps up to three layers.  After dry proceed with routine pouching    WOC team will follow patient when on campus Tuesday 02/23/2024.    Thank you,    Powell Bar MSN, RN-BC, [employer]

## 2024-02-21 NOTE — ED Notes (Signed)
Patient provided ice chips

## 2024-02-21 NOTE — Assessment & Plan Note (Addendum)
 Possible GI bleed Increased output from enterocutaneous fistula Guaiac positive output Serial H&H Surgical consult Ostomy care

## 2024-02-21 NOTE — ED Notes (Signed)
 This RN and Nathanael, RN changed the patients colostomy and Eakin pouch at this time.

## 2024-02-21 NOTE — Assessment & Plan Note (Signed)
 Creatinine 1.08 up from baseline of 0.58 IV hydration and monitor.  Avoid nephrotoxins

## 2024-02-22 DIAGNOSIS — N179 Acute kidney failure, unspecified: Secondary | ICD-10-CM

## 2024-02-22 DIAGNOSIS — Z933 Colostomy status: Secondary | ICD-10-CM | POA: Diagnosis not present

## 2024-02-22 DIAGNOSIS — K921 Melena: Secondary | ICD-10-CM | POA: Diagnosis not present

## 2024-02-22 DIAGNOSIS — K632 Fistula of intestine: Secondary | ICD-10-CM | POA: Diagnosis not present

## 2024-02-22 DIAGNOSIS — L24B3 Irritant contact dermatitis related to fecal or urinary stoma or fistula: Secondary | ICD-10-CM | POA: Diagnosis not present

## 2024-02-22 LAB — BASIC METABOLIC PANEL WITH GFR
Anion gap: 10 (ref 5–15)
BUN: 9 mg/dL (ref 8–23)
CO2: 25 mmol/L (ref 22–32)
Calcium: 8.4 mg/dL — ABNORMAL LOW (ref 8.9–10.3)
Chloride: 107 mmol/L (ref 98–111)
Creatinine, Ser: 0.7 mg/dL (ref 0.44–1.00)
GFR, Estimated: >60 mL/min (ref >60)
Glucose, Bld: 96 mg/dL (ref 70–99)
Potassium: 3.5 mmol/L (ref 3.5–5.1)
Sodium: 142 mmol/L (ref 135–145)

## 2024-02-22 LAB — CBC
HCT: 31 % — ABNORMAL LOW (ref 36.0–46.0)
Hemoglobin: 9.6 g/dL — ABNORMAL LOW (ref 12.0–15.0)
MCH: 27.8 pg (ref 26.0–34.0)
MCHC: 31 g/dL (ref 30.0–36.0)
MCV: 89.9 fL (ref 80.0–100.0)
Platelets: 353 K/uL (ref 150–400)
RBC: 3.45 MIL/uL — ABNORMAL LOW (ref 3.87–5.11)
RDW: 15.1 % (ref 11.5–15.5)
WBC: 9.6 K/uL (ref 4.0–10.5)
nRBC: 0 % (ref 0.0–0.2)

## 2024-02-22 LAB — PHOSPHORUS: Phosphorus: 3.7 mg/dL (ref 2.5–4.6)

## 2024-02-22 LAB — MAGNESIUM: Magnesium: 1.8 mg/dL (ref 1.7–2.4)

## 2024-02-22 MED ORDER — POTASSIUM CHLORIDE CRYS ER 20 MEQ PO TBCR
40.0000 meq | EXTENDED_RELEASE_TABLET | Freq: Once | ORAL | Status: AC
  Administered 2024-02-22: 40 meq via ORAL
  Filled 2024-02-22: qty 2

## 2024-02-22 NOTE — Discharge Summary (Signed)
 Physician Discharge Summary   Patient: Kristin Miller MRN: [id number] DOB: 04/12/1936  Admit date:     02/20/2024  Discharge date: 02/22/24  Discharge Physician: Kristin Miller   PCP: Ziglar, Susan K, MD   Recommendations at discharge:   Follow-up with PCP in 1 week. Follow-up with her primary surgeon as scheduled.  Discharge Diagnoses: Principal Problem:   Hematochezia Active Problems:   Enterocutaneous fistula   Colon cancer S/P partial resection of colon   Colostomy present (HCC)   HTN (hypertension)   AKI (acute kidney injury) (HCC)   Hypokalemia   Black stools  Resolved Problems:   * No resolved hospital problems. *  Hospital Course: Kristin Miller is a 88 y.o. female with medical history significant for Patient with a history of HTN, adenocarcinoma of the colon s/p colon resection now with colostomy with postoperative course complicated by enterocutaneous fistula with Eakin's bag being admitted with concerns for GI bleeding.   Monitored in the hospital, seen by general surgery, the output from the fistula is slowing down.  Most importantly, no longer has any black stools.  Her hemoglobin has been stable.  At this point, she is medically stable for discharge.  Assessment and Plan: Enterocutaneous fistula Possible GI bleed, ruled out Increased output from enterocutaneous fistula Patient is seen by general surgery, increased output from enterocutaneous fistula can happen intermittently, it is not a concern.  The drain from the fistula no longer looks black.  Her hemoglobin also stable.  She is medically stable for discharge.   Colon cancer S/P partial resection of colon Colostomy status Followed by oncology Colostomy output normal     Hypokalemia AKI (acute kidney injury) (HCC) Renal function normalized, potassium 3.5, will give another 40 mEq of oral KCl before discharge.   HTN (hypertension) Continue home meds       Consultants: General surgery Procedures  performed: None  Disposition: Home Diet recommendation:  Discharge Diet Orders (From admission, onward)     Start     Ordered   02/22/24 0000  Diet - low sodium heart healthy        02/22/24 1030           Cardiac diet DISCHARGE MEDICATION: Allergies as of 02/22/2024       Reactions   Combigan  [brimonidine  Tartrate-timolol ]    Redness, burning, irritation   Lisinopril  Cough   Other Itching, Rash   SUTURES-(PARTIAL HYSTERECTOMY)        Medication List     STOP taking these medications    losartan -hydrochlorothiazide  100-12.5 MG tablet Commonly known as: HYZAAR       TAKE these medications    acetaminophen  500 MG tablet Commonly known as: TYLENOL  Take 500 mg by mouth every 6 (six) hours as needed for pain.   alendronate  70 MG tablet Commonly known as: FOSAMAX  Take 1 tablet (70 mg total) by mouth every 7 (seven) days for 48 doses. Take with a full glass of water  on an empty stomach.   amLODipine  5 MG tablet Commonly known as: NORVASC  Take 1 tablet (5 mg total) by mouth daily. What changed: when to take this   latanoprost  0.005 % ophthalmic solution Commonly known as: XALATAN  1 drop at bedtime. Eye doctor both eyes   mirtazapine  7.5 MG tablet Commonly known as: REMERON  Take 1 tablet (7.5 mg total) by mouth at bedtime.   ondansetron  4 MG tablet Commonly known as: ZOFRAN  Take 1 tablet (4 mg total) by mouth every 8 (eight) hours as  needed for nausea or vomiting.   Zinc 50 MG Tabs Take 1 tablet by mouth daily at 6 (six) AM.               Discharge Care Instructions  (From admission, onward)           Start     Ordered   02/22/24 0000  Discharge wound care:       Comments: Follow with pcp   02/22/24 1030            Follow-up Information     Ziglar, Susan K, MD Follow up in 1 week(s).   Specialty: Family Medicine Contact information: [address] [place] [address] [phone]                Discharge  Exam: Kristin Miller   02/21/24 1402  Weight: 50.8 kg   General exam: Appears calm and comfortable  Respiratory system: Clear to auscultation. Respiratory effort normal. Cardiovascular system: S1 & S2 heard, RRR. No JVD, murmurs, rubs, gallops or clicks. No pedal edema. Gastrointestinal system: Abdomen is nondistended, soft and nontender. No organomegaly or masses felt. Normal bowel sounds heard. Central nervous system: Alert and oriented. No focal neurological deficits. Extremities: Symmetric 5 x 5 power. Skin: No rashes, lesions or ulcers Psychiatry: Judgement and insight appear normal. Mood & affect appropriate.    Condition at discharge: good  The results of significant diagnostics from this hospitalization (including imaging, microbiology, ancillary and laboratory) are listed below for reference.   Imaging Studies: CT ABDOMEN PELVIS W CONTRAST Result Date: 02/21/2024 EXAM: CT ABDOMEN AND PELVIS WITH CONTRAST 02/21/2024 02:25:54 AM TECHNIQUE: CT of the abdomen and pelvis was performed with the administration of intravenous contrast. Multiplanar reformatted images are provided for review. Automated exposure control, iterative reconstruction, and/or weight based adjustment of the mA/kV was utilized to reduce the radiation dose to as low as reasonably achievable. COMPARISON: CT from 01/18/2024. CLINICAL HISTORY: Increased fistula output, guiac positive. Patient was here in April for colon cancer. Today the patient presents with increased output from her surgical site and Eakin pouch. Husband feels like the wound has opened up more. Patient reports the area stings when it is being cleaned. Patient reports no concerns at the site of the stoma. Husband also reports the surgical opening appears larger than it was a week ago. FINDINGS: LOWER CHEST: Moderate hiatal hernia. LIVER: The liver is unremarkable. GALLBLADDER AND BILE DUCTS: Gallbladder wall hyperemia and trace pericholecystic fluid. SPLEEN:  No acute abnormality. PANCREAS: No acute abnormality. ADRENAL GLANDS: Stable. KIDNEYS, URETERS AND BLADDER: No urinary calculi or hydronephrosis. Urinary bladder is unremarkable. GI AND BOWEL: Subtotal colectomy with Hartmann pouch and residual cecum in the right lower quadrant. Inflammatory thickening of the residual cecum and small bowel in the right lower quadrant as well as the bowel in the colostomy. Similar inflammation of the small bowel in the left abdomen. Wall thickening about the stomach in the hiatal hernia. No bowel obstruction. PERITONEUM AND RETROPERITONEUM: Gas and fluid collection in the anterior peritoneum measures 6.6 x 2.1 cm, previously 8.2 x 2.8 cm. There are multiple fistulous tracts extending from the gas and fluid collection through the midline anterior abdominal wall to the skin surface. On Jan 18 2024 this contained leaked enteric contrast from the small bowel compatible with enterocutaneous fistula. Adjacent stranding in the right lower quadrant as well as in the subcutaneous fat of the lower abdomen. Decreased rim enhancing fluid collection inferior to the right hepatic lobe measuring  1.7 cm ( series 2 image 33 ). The previous fluid collection in the left upper quadrant anterior to the greater curvature of the stomach as nearly resolved and now measures approximately 1.5 cm ( series 2 image 23 ). VASCULATURE: Aorta is normal in caliber. LYMPH NODES: No lymphadenopathy. REPRODUCTIVE ORGANS: No acute abnormality. BONES AND SOFT TISSUES: No acute osseous abnormality. Vertebra plana of T12, chronic. IMPRESSION: 1. Gas and fluid collection in the anterior peritoneum with multiple fistulous tracts extending to the skin surface, compatible with enterocutaneous fistula. The collection measures 6.6 x 2.1 cm, previously 8.2 x 2.8 cm. 2. Similar enterocolitis involving the small bowel in the left abdomen and small bowel and cecum in the anterior right abdomen 3. Decreased rim enhancing fluid  collections inferior to the right hepatic lobe and in the left upper quadrant. 4. Gallbladder wall hyperemia and trace pericholecystic fluid, favored reactive . 5. Moderate hiatal hernia with inflammation of the stomach wall in the hernia. Electronically signed by: Norman Gatlin MD 02/21/2024 03:03 AM EDT RP Workstation: [id number]    Microbiology: Results for orders placed or performed during the hospital encounter of 11/13/23  Aerobic/Anaerobic Culture w Gram Stain (surgical/deep wound)     Status: None   Collection Time: 11/20/23  4:25 PM   Specimen: Abdomen; Abscess  Result Value Ref Range Status   Specimen Description   Final    ABDOMEN ABSCESS Performed at [facility], [address] [place], [place] [address]    Special Requests   Final    1 SUPERIOR LEFT ALONG GREATER CURVATURE OF STOMACH Performed at [facility], [address] [place], [place] [address]    Gram Stain NO WBC SEEN NO ORGANISMS SEEN   Final   Culture   Final    RARE CANDIDA ALBICANS NO ANAEROBES ISOLATED Performed at [facility], [address] [place], [place] [address]    Report Status 11/25/2023 FINAL  Final  Aerobic/Anaerobic Culture w Gram Stain (surgical/deep wound)     Status: None   Collection Time: 11/20/23  4:46 PM   Specimen: Abdomen; Abscess  Result Value Ref Range Status   Specimen Description   Final    ABDOMEN ABSCESS Performed at [facility], [address], [place], [place] [address]    Special Requests   Final    NONE Performed at [facility], [address] [place], [place] [address]    Gram Stain NO WBC SEEN FEW YEAST WITH PSEUDOHYPHAE   Final   Culture   Final    ABUNDANT CANDIDA ALBICANS NO ANAEROBES ISOLATED Performed at [facility], [address], [place], [place] [address]    Report Status 11/25/2023 FINAL  Final  Culture, blood (Routine X 2) w Reflex to ID Panel     Status: None   Collection Time: 11/20/23   6:39 PM   Specimen: BLOOD  Result Value Ref Range Status   Specimen Description BLOOD BLOOD LEFT WRIST  Final   Special Requests   Final    BOTTLES DRAWN AEROBIC AND ANAEROBIC Blood Culture results may not be optimal due to an inadequate volume of blood received in culture bottles   Culture   Final    NO GROWTH 5 DAYS Performed at [facility], [address] [place], [place] [address]    Report Status 11/25/2023 FINAL  Final  Culture, blood (Routine X 2) w Reflex to ID Panel     Status: None  Collection Time: 11/21/23  7:04 AM   Specimen: BLOOD LEFT WRIST  Result Value Ref Range Status   Specimen Description BLOOD LEFT WRIST  Final   Special Requests   Final    BOTTLES DRAWN AEROBIC AND ANAEROBIC Blood Culture adequate volume   Culture   Final    NO GROWTH 5 DAYS Performed at [facility], [address] [place], [place] [address]    Report Status 11/26/2023 FINAL  Final  Aerobic/Anaerobic Culture w Gram Stain (surgical/deep wound)     Status: None   Collection Time: 12/10/23 10:24 AM   Specimen: Abdomen; Abscess  Result Value Ref Range Status   Specimen Description   Final    ABDOMEN Performed at [facility], [address], [place], [place] [address]    Special Requests   Final    NONE Performed at [facility], [address] [place], [place] [address]    Gram Stain   Final    ABUNDANT WBC PRESENT, PREDOMINANTLY PMN RARE BUDDING YEAST SEEN    Culture   Final    RARE CANDIDA ALBICANS NO ANAEROBES ISOLATED Performed at [facility], [address] [place], [place] [address]    Report Status 12/15/2023 FINAL  Final    Labs: CBC: Recent Labs  Lab 02/20/24 2333 02/21/24 0605 02/22/24 0336  WBC 10.1 8.0 9.6  HGB 10.7* 9.8* 9.6*  HCT 34.7* 31.3* 31.0*  MCV 89.7 89.7 89.9  PLT 411* 365 353   Basic Metabolic Panel: Recent Labs  Lab 02/20/24 2333 02/21/24 1409 02/22/24 0335 02/22/24 0336  NA 142 142 142   --   Miller 3.3* 3.3* 3.5  --   CL 109 111 107  --   CO2 22 24 25   --   GLUCOSE 150* 123* 96  --   BUN 10 7* 9  --   CREATININE 1.08* 0.88 0.70  --   CALCIUM 8.7* 8.5* 8.4*  --   MG  --   --   --  1.8  PHOS  --   --   --  3.7   Liver Function Tests: Recent Labs  Lab 02/20/24 2333  AST 17  ALT 7  ALKPHOS 73  BILITOT 0.5  PROT 7.0  ALBUMIN  3.0*   CBG: No results for input(s): GLUCAP in the last 168 hours.  Discharge time spent: greater than 30 minutes.  Signed: Murvin Mana, MD [facility] 02/22/2024

## 2024-02-22 NOTE — Plan of Care (Signed)
  Problem: Health Behavior/Discharge Planning: Goal: Ability to manage health-related needs will improve Outcome: Progressing   Problem: Activity: Goal: Risk for activity intolerance will decrease Outcome: Progressing   Problem: Pain Managment: Goal: General experience of comfort will improve and/or be controlled Outcome: Progressing   Problem: Safety: Goal: Ability to remain free from injury will improve Outcome: Progressing

## 2024-02-22 NOTE — Discharge Instructions (Signed)
 Rent/Utility/Housing  Agency Name: [facility] Agency Address: [address] [place], [place] [address] Phone: [phone] Email: [email or web address] Website: [email or web address].org Service(s) Offered: Housing services, self-sufficiency, congregate meal program, weatherization program, Field seismologist program, emergency food assistance,  housing counseling, home ownership program, wheels -towork program.  Agency Name: [facility] Address: [address] [place], [place] [address] Phone: [phone] (8a-4p) [phone] (8p- 10p) Email: [email or web address] Website: [email or web address] Service(s) Offered: A program for homeless and/or needy men that includes one-on-one counseling, life skills training and job rehabilitation.  Agency Name: [facility] [place] Address: [address] [place], [place] [address] Phone: [phone] Website: www.alliedchurches.org Service(s) Offered: Assistance to needy in emergency with utility bills, heating fuel, and prescriptions. Shelter for homeless 7pm-7am. November 13, 2016 15  Agency Name: [facility] [place] [facility] Address: [address], [place], [place] [address] Phone: [phone] Contact Person: Cathleen Corti Email: [email or web address] Website: [email or web address].[email or web address] Service(s) Offered: Helps individuals with developmental disabilities move from housing that is more restrictive to homes where they  can achieve greater independence and have more  opportunities.  Agency Name: [facility] Address: [address], [place], [place] [address] Phone: [phone] Email: [email or web address] Website: www.[email or web address].org Service(s) Offered: Provides affordable housing for low-income families, elderly, and disabled individuals. Offer a wide range of  programs and services, from financial planning to  afterschool and summer programs.  Agency Name: [facility] [employer] [facility] Address: [address] [place], [place] [address] Phone: [phone] Service(s) Offered: Child support services; child welfare services; food stamps; Medicaid; work first family assistance; and aid with fuel,  rent, food and medicine.  Agency Name: [facility] [place] [employer] Address: [facility] [address], [place], [place]  [address] Phone: [phone] Website: www.familyabuseservices.org Service(s) Offered: 24 hour Crisis Line: [phone]; 24 hour Emergency Shelter; Transitional Housing; Support Groups; Scientist, physiological; [employer]; Hispanic Outreach: [phone];  Visitation Center: [phone].  Agency Name: [facility] [place]. Address: [address] [place], [place] [address] Phone: [phone] Service(s) Offered: CAP Services; Home and [employer]; Individual or Group Supports; Respite Care Non-Institutional Nursing;  Residential Supports; Respite Care and Personal Care Services; Transportation; Family and Friends Night; Recreational Activities; Three Nutritious Meals/Snacks; Consultation with Registered Dietician; Twenty-four hour Registered Nurse Access; Daily and [employer]; Camp Green Leaves; [place] for the [employer] (During Summer Months) Bingo Night (Every  Wednesday Night); Special Populations Dance Night  (Every Tuesday Night); Professional Hair Care Services.  Agency Name: [facility] Address: [address], [place], [place] [address] Phone: [phone] Contact Person: Jabier Mutton Website: [email or web address].Physicist, medical) Offered: Residential treatment facility for women; food and  clothing, educational & employment development and  transportation to work; Counsellor of financial skills;  parenting and family reunification; emotional and spiritual  support;  transitional housing for program graduates.  Agency Name: [facility] Address: [address] [place], [place] [address] Phone: [phone] Email: [email or web address] Website: TaskTown.es Service(s) Offered: Public housing units for elderly, disabled, and low income people; housing choice vouchers for income eligible  applicants; shelter plus care vouchers; and Psychologist, clinical.  Agency Name: [facility] [employer] Address: [address] [place], [place] [address] Phone: [phone] Email: habitat1@[email or web address] Website: www.habitatalamance.org Service(s) Offered: Build houses for families in need of decent housing. Each adult in the family must invest 200 hours of labor on  someone else's house, work with volunteers to build their own house, attend classes  on budgeting, home maintenance, yard care, and attend homeowner association meetings.  Agency Name: [facility] Address: [address], [place], [place] [address] Phone: [phone] Website: www.rsli.org Service(s) Offered: Intermediate care facilities for intellectually delayed, Supervised Living in group homes for adults with developmental disabilities, Supervised Living for people who have dual diagnoses (MRMI), Independent Living, Supported Living, respite and a variety of CAP services, pre-vocational services, day supports, and [employer].  Agency Name: [place] [employer] Phone: [phone] Website: www.NCForeclosurePrevention.gov Service(s) Offered: Zero-interest, deferred loans to homeowners struggling to pay their mortgage. Call for more information.

## 2024-02-22 NOTE — Care Management Obs Status (Signed)
 MEDICARE OBSERVATION STATUS NOTIFICATION   Patient Details  Name: Kristin Miller MRN: [id number] Date of Birth: 1936-03-10   Medicare Observation Status Notification Given:  No (patient did not want a copy)    [name] 02/22/2024, 9:47 AM

## 2024-02-22 NOTE — TOC CM/SW Note (Signed)
 Transition of Care [facility]) - Inpatient Brief Assessment   Patient Details  Name: Kristin Miller MRN: [id number] Date of Birth: 01-Dec-1935  Transition of Care [facility]) CM/SW Contact:    Asberry CHRISTELLA Jaksch, RN Phone Number: 02/22/2024, 10:43 AM   Clinical Narrative:  Transition of Care [facility]) Screening Note   Patient Details  Name: Kristin Miller Date of Birth: Jul 28, 1935   Transition of Care [facility]) CM/SW Contact:    Asberry CHRISTELLA Jaksch, RN Phone Number: 02/22/2024, 10:43 AM    Transition of Care Department [facility]) has reviewed patient and no TOC needs have been identified at this time. If new patient transition needs arise, please place a TOC consult.   Patient is active with PT, OT, and nursing via [facility]. Notified [name]  with [facility] of patient's discharge.   Transition of Care Asessment: Insurance and Status: Insurance coverage has been reviewed Patient has primary care physician: Yes     Prior/Current Home Services: Current home services ([facility] PT, OT, Nursing) Social Drivers of Health Review: SDOH reviewed interventions complete (Housing resources added to AVS) Readmission risk has been reviewed: Yes Transition of care needs: no transition of care needs at this time

## 2024-02-23 DIAGNOSIS — K9403 Colostomy malfunction: Secondary | ICD-10-CM | POA: Diagnosis not present

## 2024-02-23 DIAGNOSIS — M1990 Unspecified osteoarthritis, unspecified site: Secondary | ICD-10-CM | POA: Diagnosis not present

## 2024-02-23 DIAGNOSIS — K632 Fistula of intestine: Secondary | ICD-10-CM | POA: Diagnosis not present

## 2024-02-23 DIAGNOSIS — G43909 Migraine, unspecified, not intractable, without status migrainosus: Secondary | ICD-10-CM | POA: Diagnosis not present

## 2024-02-23 DIAGNOSIS — I10 Essential (primary) hypertension: Secondary | ICD-10-CM | POA: Diagnosis not present

## 2024-02-23 DIAGNOSIS — D649 Anemia, unspecified: Secondary | ICD-10-CM | POA: Diagnosis not present

## 2024-02-23 DIAGNOSIS — Z853 Personal history of malignant neoplasm of breast: Secondary | ICD-10-CM | POA: Diagnosis not present

## 2024-02-23 DIAGNOSIS — M8580 Other specified disorders of bone density and structure, unspecified site: Secondary | ICD-10-CM | POA: Diagnosis not present

## 2024-02-23 DIAGNOSIS — K651 Peritoneal abscess: Secondary | ICD-10-CM | POA: Diagnosis not present

## 2024-02-23 DIAGNOSIS — Z8701 Personal history of pneumonia (recurrent): Secondary | ICD-10-CM | POA: Diagnosis not present

## 2024-02-23 DIAGNOSIS — R32 Unspecified urinary incontinence: Secondary | ICD-10-CM | POA: Diagnosis not present

## 2024-02-26 DIAGNOSIS — Z853 Personal history of malignant neoplasm of breast: Secondary | ICD-10-CM | POA: Diagnosis not present

## 2024-02-26 DIAGNOSIS — G43909 Migraine, unspecified, not intractable, without status migrainosus: Secondary | ICD-10-CM | POA: Diagnosis not present

## 2024-02-26 DIAGNOSIS — K632 Fistula of intestine: Secondary | ICD-10-CM | POA: Diagnosis not present

## 2024-02-26 DIAGNOSIS — M8580 Other specified disorders of bone density and structure, unspecified site: Secondary | ICD-10-CM | POA: Diagnosis not present

## 2024-02-26 DIAGNOSIS — I10 Essential (primary) hypertension: Secondary | ICD-10-CM | POA: Diagnosis not present

## 2024-02-26 DIAGNOSIS — D649 Anemia, unspecified: Secondary | ICD-10-CM | POA: Diagnosis not present

## 2024-02-26 DIAGNOSIS — M1990 Unspecified osteoarthritis, unspecified site: Secondary | ICD-10-CM | POA: Diagnosis not present

## 2024-02-26 DIAGNOSIS — K651 Peritoneal abscess: Secondary | ICD-10-CM | POA: Diagnosis not present

## 2024-02-26 DIAGNOSIS — K9403 Colostomy malfunction: Secondary | ICD-10-CM | POA: Diagnosis not present

## 2024-02-26 DIAGNOSIS — R32 Unspecified urinary incontinence: Secondary | ICD-10-CM | POA: Diagnosis not present

## 2024-02-26 DIAGNOSIS — Z8701 Personal history of pneumonia (recurrent): Secondary | ICD-10-CM | POA: Diagnosis not present

## 2024-02-29 DIAGNOSIS — L24B3 Irritant contact dermatitis related to fecal or urinary stoma or fistula: Secondary | ICD-10-CM | POA: Diagnosis not present

## 2024-02-29 DIAGNOSIS — Z933 Colostomy status: Secondary | ICD-10-CM | POA: Diagnosis not present

## 2024-03-01 DIAGNOSIS — G43909 Migraine, unspecified, not intractable, without status migrainosus: Secondary | ICD-10-CM | POA: Diagnosis not present

## 2024-03-01 DIAGNOSIS — M1990 Unspecified osteoarthritis, unspecified site: Secondary | ICD-10-CM | POA: Diagnosis not present

## 2024-03-01 DIAGNOSIS — Z8701 Personal history of pneumonia (recurrent): Secondary | ICD-10-CM | POA: Diagnosis not present

## 2024-03-01 DIAGNOSIS — Z853 Personal history of malignant neoplasm of breast: Secondary | ICD-10-CM | POA: Diagnosis not present

## 2024-03-01 DIAGNOSIS — K632 Fistula of intestine: Secondary | ICD-10-CM | POA: Diagnosis not present

## 2024-03-01 DIAGNOSIS — K651 Peritoneal abscess: Secondary | ICD-10-CM | POA: Diagnosis not present

## 2024-03-01 DIAGNOSIS — R32 Unspecified urinary incontinence: Secondary | ICD-10-CM | POA: Diagnosis not present

## 2024-03-01 DIAGNOSIS — K9403 Colostomy malfunction: Secondary | ICD-10-CM | POA: Diagnosis not present

## 2024-03-01 DIAGNOSIS — D649 Anemia, unspecified: Secondary | ICD-10-CM | POA: Diagnosis not present

## 2024-03-01 DIAGNOSIS — I10 Essential (primary) hypertension: Secondary | ICD-10-CM | POA: Diagnosis not present

## 2024-03-01 DIAGNOSIS — M8580 Other specified disorders of bone density and structure, unspecified site: Secondary | ICD-10-CM | POA: Diagnosis not present

## 2024-03-02 ENCOUNTER — Encounter: Payer: Self-pay | Admitting: Surgery

## 2024-03-02 ENCOUNTER — Ambulatory Visit (INDEPENDENT_AMBULATORY_CARE_PROVIDER_SITE_OTHER): Admitting: Surgery

## 2024-03-02 VITALS — BP 138/82 | HR 97 | Temp 97.9°F | Ht 60.0 in | Wt 112.0 lb

## 2024-03-02 DIAGNOSIS — R32 Unspecified urinary incontinence: Secondary | ICD-10-CM | POA: Diagnosis not present

## 2024-03-02 DIAGNOSIS — K651 Peritoneal abscess: Secondary | ICD-10-CM | POA: Diagnosis not present

## 2024-03-02 DIAGNOSIS — Z853 Personal history of malignant neoplasm of breast: Secondary | ICD-10-CM | POA: Diagnosis not present

## 2024-03-02 DIAGNOSIS — K9403 Colostomy malfunction: Secondary | ICD-10-CM | POA: Diagnosis not present

## 2024-03-02 DIAGNOSIS — K9400 Colostomy complication, unspecified: Secondary | ICD-10-CM

## 2024-03-02 DIAGNOSIS — Z8701 Personal history of pneumonia (recurrent): Secondary | ICD-10-CM | POA: Diagnosis not present

## 2024-03-02 DIAGNOSIS — M8580 Other specified disorders of bone density and structure, unspecified site: Secondary | ICD-10-CM | POA: Diagnosis not present

## 2024-03-02 DIAGNOSIS — K632 Fistula of intestine: Secondary | ICD-10-CM | POA: Diagnosis not present

## 2024-03-02 DIAGNOSIS — G43909 Migraine, unspecified, not intractable, without status migrainosus: Secondary | ICD-10-CM | POA: Diagnosis not present

## 2024-03-02 DIAGNOSIS — D649 Anemia, unspecified: Secondary | ICD-10-CM | POA: Diagnosis not present

## 2024-03-02 DIAGNOSIS — I10 Essential (primary) hypertension: Secondary | ICD-10-CM | POA: Diagnosis not present

## 2024-03-02 DIAGNOSIS — M1990 Unspecified osteoarthritis, unspecified site: Secondary | ICD-10-CM | POA: Diagnosis not present

## 2024-03-02 NOTE — Patient Instructions (Addendum)
 Try eating some fresh vegetables to see if this helps. Do eat plenty of protein to help with healing. Be sure to drink plenty of fluids.       Please call our office if you develop questions or concerns.

## 2024-03-03 NOTE — Progress Notes (Signed)
 Outpatient Surgical Follow Up    Kristin Miller is an 88 y.o. female.   Chief Complaint  Patient presents with   Follow-up    HPI: Icy 4 months out from colectomy with end colostomy.  She developed full necrosis of her stoma requiring revision.  She is now 2 months out from the revision.  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  She had a prolonged hospitalization for about a month.  She did have an intra-abdominal abscess that was drained by interventional radiology.  She now comes in for follow-up. All her drains are out.  She did have some concern for GI bleed and was readmitted to the hospital.  Workup revealed no evidence of active bleeding.  At that time she had a CT pers reviewed, evidence of known fistula w/o new collection or acute issues   She has been taking diet and her colostomy is working.  Now she has got an Eakin pouch to the fistula and is draining still 1-2 ounces a day.  No fevers no chills.  She  has started on imodium  w good results and further decrease in fistula output They are changing the Eakin and colostomy appliance twice a week. Maranda who is her husband wishes for me to take the ostomy and eakin pouch off today She is tolerating PO and continues to improve  slowly, is not having more p.o. intake Past Medical History:  Diagnosis Date   Allergy    Anemia    H/O AS A CHILD   Arthritis    Back pain    Breast cancer of upper-outer quadrant of right female breast (HCC) 06/12/2015   4 millimeter, T1a, N0; ER 90%, PR 0%, HER-2/neu not overexpressed.  Declined radiation therapy.   Collar bone fracture 05/28/2013   Fractured Collar bone on left   Dyspnea    Dysrhythmia    h/o heart skipping beat years ago   Frequent headaches    h/o migraines   Hypertension    Osteopenia 04/2018   Bone density without significant interval change since 2017.   Pneumonia     Past  Surgical History:  Procedure Laterality Date   ABDOMINAL HYSTERECTOMY  1970   Partial   BREAST BIOPSY Right 05/28/2015   INVASIVE MAMMARY CARCINOMA    BREAST BIOPSY Right 06/12/2015   Wide excision, sentinel lymph node biopsy.   BREAST EXCISIONAL BIOPSY Right    BREAST LUMPECTOMY WITH AXILLARY LYMPH NODE DISSECTION Right 06/12/2015   4 mm, T1a,N0 withDCIS with clear margins. ER: 90%, PR 0%; Her 2 neu not overexpressed.    CATARACT EXTRACTION W/PHACO Left 02/03/2017   Procedure: CATARACT EXTRACTION PHACO AND INTRAOCULAR LENS PLACEMENT (IOC);  Surgeon: Jaye Fallow, MD;  Location: [facility];  Service: Ophthalmology;  Laterality: Left;  US   00:28 AP% 16.3 CDE 4.68 Fluid pack lot # [id number]   CATARACT EXTRACTION W/PHACO Right 03/01/2019   Procedure: CATARACT EXTRACTION PHACO AND INTRAOCULAR LENS PLACEMENT (IOC)  RIGHT;  Surgeon: Jaye Fallow, MD;  Location: [facility];  Service: Ophthalmology;  Laterality: Right;   COLONOSCOPY     COLONOSCOPY WITH PROPOFOL  N/A 06/27/2022   Procedure: COLONOSCOPY WITH PROPOFOL ;  Surgeon: Jinny Carmine, MD;  Location: [facility];  Service: Endoscopy;  Laterality: N/A;   COLONOSCOPY WITH PROPOFOL  N/A 10/06/2023   Procedure: COLONOSCOPY WITH PROPOFOL ;  Surgeon: Jinny Carmine, MD;  Location: [facility];  Service: Endoscopy;  Laterality:  N/A;   COLOSTOMY REVISION N/A 11/13/2023   Procedure: REVISION, COLOSTOMY;  Surgeon: Jordis Laneta FALCON, MD;  Location: [facility];  Service: General;  Laterality: N/A;   ESOPHAGOGASTRODUODENOSCOPY (EGD) WITH PROPOFOL  N/A 06/27/2022   Procedure: ESOPHAGOGASTRODUODENOSCOPY (EGD) WITH PROPOFOL ;  Surgeon: Jinny Carmine, MD;  Location: [facility];  Service: Endoscopy;  Laterality: N/A;   PARTIAL COLECTOMY N/A 07/29/2022   Procedure: PARTIAL COLECTOMY, open left, RNFA to assist;  Surgeon: Jordis Laneta FALCON, MD;  Location: [facility];  Service: General;  Laterality: N/A;   POLYPECTOMY  10/06/2023   Procedure:  POLYPECTOMY;  Surgeon: Jinny Carmine, MD;  Location: [facility];  Service: Endoscopy;;    Family History  Problem Relation Age of Onset   Mental illness Mother        Dementia   Stroke Father    Lung cancer Brother    Breast cancer Other        dx under 40   Brain cancer Granddaughter 12       glioblastoma    Social History:  reports that she has never smoked. She has never been exposed to tobacco smoke. She has never used smokeless tobacco. She reports that she does not drink alcohol and does not use drugs.  Allergies:  Allergies  Allergen Reactions   Combigan  [Brimonidine  Tartrate-Timolol ]     Redness, burning, irritation    Lisinopril  Cough   Other Itching and Rash    SUTURES-(PARTIAL HYSTERECTOMY)    Medications reviewed.    ROS Full ROS performed and is otherwise negative other than what is stated in HPI   BP 138/82   Pulse 97   Temp 97.9 F (36.6 C) (Oral)   Ht 5' (1.524 m)   Wt 112 lb (50.8 kg)   SpO2 97%   BMI 21.87 kg/m   Physical Exam Vitals and nursing note reviewed.  Constitutional:      Appearance: Normal appearance.  Pulmonary:     Effort: Pulmonary effort is normal.     Breath sounds: No stridor.  Abdominal:     General: There is no distension.     Palpations: Abdomen is soft.     Comments: no rebound/guarding. Colostomy appliance removed along w eakin pouch, Ostomy in right abdominal wall, this is pink, patent and productive, does sit in a skin fold. To the left there is two pin point hole draining some feculent material.  Skin:    General: Skin is warm and dry.     Capillary Refill: Capillary refill takes less than 2 seconds.  Neurological:     General: No focal deficit present.     Mental Status: She is alert and oriented to person, place, and time.  Psychiatric:        Mood and Affect: Mood normal.        Behavior: Behavior normal.        Thought Content: Thought content normal.        Judgment: Judgment normal.      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 Advice pt about natural fiber in her diet and increase protein intake. No need for hospitalization, continue eakin pouch . RTC 3 weeks or so I personally spent a total of 30 minutes in the care of the patient today including performing a medically appropriate exam/evaluation, counseling and educating, placing orders, referring and communicating with other health care professionals, documenting clinical information in the EHR, independently interpreting and reviewing images studies and coordinating  care.   Laneta Luna, MD [facility] General Surgeon

## 2024-03-07 ENCOUNTER — Encounter: Payer: Self-pay | Admitting: Family Medicine

## 2024-03-07 ENCOUNTER — Ambulatory Visit (INDEPENDENT_AMBULATORY_CARE_PROVIDER_SITE_OTHER): Admitting: Family Medicine

## 2024-03-07 VITALS — BP 137/82 | HR 92 | Temp 98.1°F | Resp 18 | Ht 60.0 in | Wt 115.0 lb

## 2024-03-07 DIAGNOSIS — I10 Essential (primary) hypertension: Secondary | ICD-10-CM | POA: Diagnosis not present

## 2024-03-07 DIAGNOSIS — Z0120 Encounter for dental examination and cleaning without abnormal findings: Secondary | ICD-10-CM | POA: Diagnosis not present

## 2024-03-07 MED ORDER — AMLODIPINE BESYLATE 5 MG PO TABS: 5.0000 mg | ORAL_TABLET | Freq: Every day | ORAL | 1 refills | Status: DC

## 2024-03-07 NOTE — Progress Notes (Signed)
 Established Patient Office Visit  Subjective   Patient ID: Kristin Miller, female    DOB: [birth year]  Age: 88 y.o. MRN: [id number]  Chief Complaint  Patient presents with   Medical Management of Chronic Issues   Skin Problem    Spot on forehead for the past 2 weeks    HPI  Discussed the use of AI scribe software for clinical note transcription with the patient, who gave verbal consent to proceed.  History of Present Illness   Delightful 88 yo woman with history of colon cancer (07/2022), s/p sigmoid colectomy and pelvic wall resection with negative margins, osteoporosis with T12 compression fracture, iron  deficiency anemia, breast cancer (ER +, PR negative, HER2/NEU no over expression) s/p lumpectomy and 5 weeks of adjuvant XRT, colonoscopy (10/06/2023, recurrence of colon cancer with large mass in descending colon), S/p left colectomy with end colostomy followed by revision of the stoma (necrosis of the original stoma), small bowel fistula, intra-abdominal abscess that was drained by IR. Initially she had 4 drains. She has an IT consultant the original ostomy and the fistula. She had a long hospital stay from 11/13/2023 to 12/15/2023, recent dehydration with Cr. 1.27  She recently visited the emergency room due to bleeding from her pouch, which turned black, causing alarm. The color returned to yellowish-green by the following day. She was hospitalized over the weekend and discharged on Tuesday. During her hospital stay, her kidney function improved from a creatinine level of 1.27 to 1.08, although her baseline is typically 0.5.  She feels better after stopping losartan  HCTZ due to dehydration. She is currently taking amlodipine  5 mg and zinc at night. Her appetite has improved significantly, and she is now able to eat more, including whole sandwiches and snacks between meals. She attributes her previous lack of appetite to dehydration.  She has a small bump on her head that has  persisted for two to three weeks, which she has been treating with Neosporin. It started as a small pimple and has grown slightly.  She was started on Imodium  during her hospital stay to manage diarrhea, but she has since stopped taking it as her symptoms have improved. She is not currently taking mirtazapine , as her appetite has returned sufficiently without it.  She reports low energy levels and is able to perform minimal activities, such as walking and getting dressed, with the help of her caregiver.      Kristin Miller is an 88 year old female who presents with concerns of dehydration and recent hospitalization.  She recently visited the emergency room due to bleeding from her pouch, which turned black, causing alarm. The color returned to yellowish-green by the following day. She was hospitalized over the weekend and discharged on Tuesday. During her hospital stay, her kidney function improved from a creatinine level of 1.27 to 1.08, although her baseline is typically 0.5.  She feels better after stopping losartan  HCTZ due to dehydration. She is currently taking amlodipine  5 mg and zinc at night. Her appetite has improved significantly, and she is now able to eat more, including whole sandwiches and snacks between meals. She attributes her previous lack of appetite to dehydration. She is not currently taking mirtazapine , as her appetite has returned sufficiently without it.  She has a small bump on her head that has persisted for two to three weeks, which she has been treating with Neosporin. It started as a small pimple and has grown slightly.  She was started on Imodium   during her hospital stay to manage diarrhea, but she has since stopped taking it as her symptoms have improved.   She reports low energy levels and is able to perform minimal activities, such as walking and getting dressed, with the help of her caregiver, her husband.        ROS    Objective:     BP 137/82   Pulse  92   Temp 98.1 F (36.7 C) (Oral)   Resp 18   Ht 5' (1.524 m)   Wt 115 lb (52.2 kg)   SpO2 97%   BMI 22.46 kg/m    Physical Exam Vitals and nursing note reviewed.  Constitutional:      Appearance: Normal appearance.  HENT:     Head: Normocephalic and atraumatic.  Eyes:     Conjunctiva/sclera: Conjunctivae normal.  Cardiovascular:     Rate and Rhythm: Normal rate and regular rhythm.     Heart sounds: Murmur (SEM LUSB) heard.  Pulmonary:     Effort: Pulmonary effort is normal.     Breath sounds: Normal breath sounds.  Musculoskeletal:     Right lower leg: No edema.     Left lower leg: No edema.  Skin:    General: Skin is warm and dry.  Neurological:     Mental Status: She is alert and oriented to person, place, and time.  Psychiatric:        Mood and Affect: Mood normal.        Behavior: Behavior normal.        Thought Content: Thought content normal.        Judgment: Judgment normal.        No results found for any visits on 03/07/24.    The ASCVD Risk score (Arnett DK, et al., 2019) failed to calculate for the following reasons:   The 2019 ASCVD risk score is only valid for ages 22 to 33    Assessment & Plan:  Primary hypertension -     amLODIPine  Besylate; Take 1 tablet (5 mg total) by mouth daily.  Dispense: 90 tablet; Refill: 1   Assessment and Plan    Dehydration with acute kidney injury in patient with chronic kidney disease Recent hospitalization for blood in her colostomy.   Kidney function improved from creatinine 1.27 to 1.08, but baseline is 0.5. Dehydration likely contributed to appetite loss. Losartan  HCTZ discontinued due to dehydration risk. - Encourage fluid intake to prevent dehydration - Avoid losartan  HCTZ until further notice  Hypertension, currently managed with amlodipine  Hypertension managed with amlodipine  5 mg. Losartan  and lisinopril  discontinued due to dehydration risk. - Continue amlodipine  5 mg daily - Avoid losartan  until  further notice  Appetite loss, improving Appetite has improved since rehydration. Previously required three days to eat a biscuit, now able to eat a whole sandwich. Mirtazapine  not needed as appetite is improving. - Encourage regular meals and snacks to maintain weight - Monitor weight and appetite  Colostomy management Recent emergency room visit due to concern of bleeding from colostomy pouch, which turned black but resolved to yellowish-green. Imodium  was started but is no longer needed as symptoms have resolved. - Use Imodium  as needed for diarrhea, but avoid if not necessary  Skin lesion of scalp Small bump on scalp present for two to three weeks. Described as a pimple-like sore that has grown. Neosporin applied. - Continue applying Neosporin to the lesion - Monitor lesion for changes        Return in about  4 weeks (around 04/04/2024).    Tonesha Tsou K Kareemah Grounds, MD

## 2024-03-07 NOTE — Assessment & Plan Note (Signed)
 BP is mildly elevated today.  She is taking amlodipine  5,g daily only right now.  May restart her losartan -hydrochlorothiazide  next month but at a lower dosage.

## 2024-03-09 DIAGNOSIS — Z853 Personal history of malignant neoplasm of breast: Secondary | ICD-10-CM | POA: Diagnosis not present

## 2024-03-09 DIAGNOSIS — K651 Peritoneal abscess: Secondary | ICD-10-CM | POA: Diagnosis not present

## 2024-03-09 DIAGNOSIS — K9403 Colostomy malfunction: Secondary | ICD-10-CM | POA: Diagnosis not present

## 2024-03-09 DIAGNOSIS — Z8701 Personal history of pneumonia (recurrent): Secondary | ICD-10-CM | POA: Diagnosis not present

## 2024-03-09 DIAGNOSIS — I10 Essential (primary) hypertension: Secondary | ICD-10-CM | POA: Diagnosis not present

## 2024-03-09 DIAGNOSIS — R32 Unspecified urinary incontinence: Secondary | ICD-10-CM | POA: Diagnosis not present

## 2024-03-09 DIAGNOSIS — K632 Fistula of intestine: Secondary | ICD-10-CM | POA: Diagnosis not present

## 2024-03-09 DIAGNOSIS — D649 Anemia, unspecified: Secondary | ICD-10-CM | POA: Diagnosis not present

## 2024-03-09 DIAGNOSIS — G43909 Migraine, unspecified, not intractable, without status migrainosus: Secondary | ICD-10-CM | POA: Diagnosis not present

## 2024-03-09 DIAGNOSIS — M8580 Other specified disorders of bone density and structure, unspecified site: Secondary | ICD-10-CM | POA: Diagnosis not present

## 2024-03-09 DIAGNOSIS — M1990 Unspecified osteoarthritis, unspecified site: Secondary | ICD-10-CM | POA: Diagnosis not present

## 2024-03-10 DIAGNOSIS — M8580 Other specified disorders of bone density and structure, unspecified site: Secondary | ICD-10-CM | POA: Diagnosis not present

## 2024-03-10 DIAGNOSIS — Z8701 Personal history of pneumonia (recurrent): Secondary | ICD-10-CM | POA: Diagnosis not present

## 2024-03-10 DIAGNOSIS — K632 Fistula of intestine: Secondary | ICD-10-CM | POA: Diagnosis not present

## 2024-03-10 DIAGNOSIS — K651 Peritoneal abscess: Secondary | ICD-10-CM | POA: Diagnosis not present

## 2024-03-10 DIAGNOSIS — G43909 Migraine, unspecified, not intractable, without status migrainosus: Secondary | ICD-10-CM | POA: Diagnosis not present

## 2024-03-10 DIAGNOSIS — Z853 Personal history of malignant neoplasm of breast: Secondary | ICD-10-CM | POA: Diagnosis not present

## 2024-03-10 DIAGNOSIS — D649 Anemia, unspecified: Secondary | ICD-10-CM | POA: Diagnosis not present

## 2024-03-10 DIAGNOSIS — K9403 Colostomy malfunction: Secondary | ICD-10-CM | POA: Diagnosis not present

## 2024-03-10 DIAGNOSIS — I10 Essential (primary) hypertension: Secondary | ICD-10-CM | POA: Diagnosis not present

## 2024-03-10 DIAGNOSIS — M1990 Unspecified osteoarthritis, unspecified site: Secondary | ICD-10-CM | POA: Diagnosis not present

## 2024-03-10 DIAGNOSIS — R32 Unspecified urinary incontinence: Secondary | ICD-10-CM | POA: Diagnosis not present

## 2024-03-15 DIAGNOSIS — M1990 Unspecified osteoarthritis, unspecified site: Secondary | ICD-10-CM | POA: Diagnosis not present

## 2024-03-15 DIAGNOSIS — R32 Unspecified urinary incontinence: Secondary | ICD-10-CM | POA: Diagnosis not present

## 2024-03-15 DIAGNOSIS — K651 Peritoneal abscess: Secondary | ICD-10-CM | POA: Diagnosis not present

## 2024-03-15 DIAGNOSIS — D649 Anemia, unspecified: Secondary | ICD-10-CM | POA: Diagnosis not present

## 2024-03-15 DIAGNOSIS — K632 Fistula of intestine: Secondary | ICD-10-CM | POA: Diagnosis not present

## 2024-03-15 DIAGNOSIS — M8580 Other specified disorders of bone density and structure, unspecified site: Secondary | ICD-10-CM | POA: Diagnosis not present

## 2024-03-15 DIAGNOSIS — K9403 Colostomy malfunction: Secondary | ICD-10-CM | POA: Diagnosis not present

## 2024-03-15 DIAGNOSIS — G43909 Migraine, unspecified, not intractable, without status migrainosus: Secondary | ICD-10-CM | POA: Diagnosis not present

## 2024-03-15 DIAGNOSIS — Z853 Personal history of malignant neoplasm of breast: Secondary | ICD-10-CM | POA: Diagnosis not present

## 2024-03-15 DIAGNOSIS — Z8701 Personal history of pneumonia (recurrent): Secondary | ICD-10-CM | POA: Diagnosis not present

## 2024-03-15 DIAGNOSIS — I10 Essential (primary) hypertension: Secondary | ICD-10-CM | POA: Diagnosis not present

## 2024-03-16 DIAGNOSIS — K651 Peritoneal abscess: Secondary | ICD-10-CM | POA: Diagnosis not present

## 2024-03-16 DIAGNOSIS — Z853 Personal history of malignant neoplasm of breast: Secondary | ICD-10-CM | POA: Diagnosis not present

## 2024-03-16 DIAGNOSIS — R32 Unspecified urinary incontinence: Secondary | ICD-10-CM | POA: Diagnosis not present

## 2024-03-16 DIAGNOSIS — M1990 Unspecified osteoarthritis, unspecified site: Secondary | ICD-10-CM | POA: Diagnosis not present

## 2024-03-16 DIAGNOSIS — Z8701 Personal history of pneumonia (recurrent): Secondary | ICD-10-CM | POA: Diagnosis not present

## 2024-03-16 DIAGNOSIS — D649 Anemia, unspecified: Secondary | ICD-10-CM | POA: Diagnosis not present

## 2024-03-16 DIAGNOSIS — M8580 Other specified disorders of bone density and structure, unspecified site: Secondary | ICD-10-CM | POA: Diagnosis not present

## 2024-03-16 DIAGNOSIS — K9403 Colostomy malfunction: Secondary | ICD-10-CM | POA: Diagnosis not present

## 2024-03-16 DIAGNOSIS — I10 Essential (primary) hypertension: Secondary | ICD-10-CM | POA: Diagnosis not present

## 2024-03-16 DIAGNOSIS — G43909 Migraine, unspecified, not intractable, without status migrainosus: Secondary | ICD-10-CM | POA: Diagnosis not present

## 2024-03-16 DIAGNOSIS — K632 Fistula of intestine: Secondary | ICD-10-CM | POA: Diagnosis not present

## 2024-03-22 DIAGNOSIS — I10 Essential (primary) hypertension: Secondary | ICD-10-CM | POA: Diagnosis not present

## 2024-03-22 DIAGNOSIS — G43909 Migraine, unspecified, not intractable, without status migrainosus: Secondary | ICD-10-CM | POA: Diagnosis not present

## 2024-03-22 DIAGNOSIS — M8580 Other specified disorders of bone density and structure, unspecified site: Secondary | ICD-10-CM | POA: Diagnosis not present

## 2024-03-22 DIAGNOSIS — Z853 Personal history of malignant neoplasm of breast: Secondary | ICD-10-CM | POA: Diagnosis not present

## 2024-03-22 DIAGNOSIS — D649 Anemia, unspecified: Secondary | ICD-10-CM | POA: Diagnosis not present

## 2024-03-22 DIAGNOSIS — K9403 Colostomy malfunction: Secondary | ICD-10-CM | POA: Diagnosis not present

## 2024-03-22 DIAGNOSIS — K632 Fistula of intestine: Secondary | ICD-10-CM | POA: Diagnosis not present

## 2024-03-22 DIAGNOSIS — Z8701 Personal history of pneumonia (recurrent): Secondary | ICD-10-CM | POA: Diagnosis not present

## 2024-03-22 DIAGNOSIS — R32 Unspecified urinary incontinence: Secondary | ICD-10-CM | POA: Diagnosis not present

## 2024-03-22 DIAGNOSIS — M1990 Unspecified osteoarthritis, unspecified site: Secondary | ICD-10-CM | POA: Diagnosis not present

## 2024-03-22 DIAGNOSIS — K651 Peritoneal abscess: Secondary | ICD-10-CM | POA: Diagnosis not present

## 2024-03-28 ENCOUNTER — Encounter: Payer: Self-pay | Admitting: Surgery

## 2024-03-28 ENCOUNTER — Ambulatory Visit (INDEPENDENT_AMBULATORY_CARE_PROVIDER_SITE_OTHER): Admitting: Surgery

## 2024-03-28 VITALS — BP 145/71 | HR 91 | Ht 60.0 in | Wt 113.8 lb

## 2024-03-28 DIAGNOSIS — C4442 Squamous cell carcinoma of skin of scalp and neck: Secondary | ICD-10-CM | POA: Diagnosis not present

## 2024-03-28 DIAGNOSIS — Z933 Colostomy status: Secondary | ICD-10-CM | POA: Diagnosis not present

## 2024-03-28 DIAGNOSIS — D485 Neoplasm of uncertain behavior of skin: Secondary | ICD-10-CM

## 2024-03-28 DIAGNOSIS — Z433 Encounter for attention to colostomy: Secondary | ICD-10-CM | POA: Diagnosis not present

## 2024-03-28 DIAGNOSIS — L989 Disorder of the skin and subcutaneous tissue, unspecified: Secondary | ICD-10-CM

## 2024-03-28 DIAGNOSIS — L24B3 Irritant contact dermatitis related to fecal or urinary stoma or fistula: Secondary | ICD-10-CM | POA: Diagnosis not present

## 2024-03-28 NOTE — Progress Notes (Signed)
 Outpatient Surgical Follow Up  03/28/2024  Kristin Miller is an 88 y.o. female.   Chief Complaint  Patient presents with   Follow-up    HPI: Kristin Miller 4 months out from colectomy with end colostomy.  She developed full necrosis of her stoma requiring revision.  She is now 3 months out from the revision.  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  She had a prolonged hospitalization for about a month.  She did have an intra-abdominal abscess that was drained by interventional radiology.  She now comes in for follow-up. All her drains are out.  She did have some concern for GI bleed and was readmitted to the hospital.  Workup revealed no evidence of active bleeding.  At that time she had a CT pers reviewed, evidence of known fistula w/o new collection or acute issues   She has been taking diet and her colostomy is working.  Now she has got an Eakin pouch to the fistula and is draining less than 1 ounces a day.   She initially had two exit holes and one of them has closed. No fevers no chills.  She is tolerating PO and continues to improve  slowly, is not having more p.o. intake  She now has left forehead skin lesion that has increased in size, has tried ointments w/o improvements.  Past Medical History:  Diagnosis Date   Allergy    Anemia    H/O AS A CHILD   Arthritis    Back pain    Breast cancer of upper-outer quadrant of right female breast (HCC) 06/12/2015   4 millimeter, T1a, N0; ER 90%, PR 0%, HER-2/neu not overexpressed.  Declined radiation therapy.   Collar bone fracture 05/28/2013   Fractured Collar bone on left   Dyspnea    Dysrhythmia    h/o heart skipping beat years ago   Frequent headaches    h/o migraines   Hypertension    Osteopenia 04/2018   Bone density without significant interval change since 2017.   Pneumonia     Past Surgical History:  Procedure Laterality Date    ABDOMINAL HYSTERECTOMY  1970   Partial   BREAST BIOPSY Right 05/28/2015   INVASIVE MAMMARY CARCINOMA    BREAST BIOPSY Right 06/12/2015   Wide excision, sentinel lymph node biopsy.   BREAST EXCISIONAL BIOPSY Right    BREAST LUMPECTOMY WITH AXILLARY LYMPH NODE DISSECTION Right 06/12/2015   4 mm, T1a,N0 withDCIS with clear margins. ER: 90%, PR 0%; Her 2 neu not overexpressed.    CATARACT EXTRACTION W/PHACO Left 02/03/2017   Procedure: CATARACT EXTRACTION PHACO AND INTRAOCULAR LENS PLACEMENT (IOC);  Surgeon: Jaye Fallow, MD;  Location: [facility];  Service: Ophthalmology;  Laterality: Left;  US   00:28 AP% 16.3 CDE 4.68 Fluid pack lot # [id number]   CATARACT EXTRACTION W/PHACO Right 03/01/2019   Procedure: CATARACT EXTRACTION PHACO AND INTRAOCULAR LENS PLACEMENT (IOC)  RIGHT;  Surgeon: Jaye Fallow, MD;  Location: [facility];  Service: Ophthalmology;  Laterality: Right;   COLONOSCOPY     COLONOSCOPY WITH PROPOFOL  N/A 06/27/2022   Procedure: COLONOSCOPY WITH PROPOFOL ;  Surgeon: Jinny Carmine, MD;  Location: [facility];  Service: Endoscopy;  Laterality: N/A;   COLONOSCOPY WITH PROPOFOL  N/A 10/06/2023   Procedure: COLONOSCOPY WITH PROPOFOL ;  Surgeon: Jinny Carmine, MD;  Location: [facility];  Service: Endoscopy;  Laterality: N/A;   COLOSTOMY REVISION N/A 11/13/2023   Procedure:  REVISION, COLOSTOMY;  Surgeon: Jordis Laneta FALCON, MD;  Location: [facility];  Service: General;  Laterality: N/A;   ESOPHAGOGASTRODUODENOSCOPY (EGD) WITH PROPOFOL  N/A 06/27/2022   Procedure: ESOPHAGOGASTRODUODENOSCOPY (EGD) WITH PROPOFOL ;  Surgeon: Jinny Carmine, MD;  Location: [facility];  Service: Endoscopy;  Laterality: N/A;   PARTIAL COLECTOMY N/A 07/29/2022   Procedure: PARTIAL COLECTOMY, open left, RNFA to assist;  Surgeon: Jordis Laneta FALCON, MD;  Location: [facility];  Service: General;  Laterality: N/A;   POLYPECTOMY  10/06/2023   Procedure: POLYPECTOMY;  Surgeon: Jinny Carmine, MD;  Location:  [facility];  Service: Endoscopy;;    Family History  Problem Relation Age of Onset   Mental illness Mother        Dementia   Stroke Father    Lung cancer Brother    Breast cancer Other        dx under 50   Brain cancer Granddaughter 12       glioblastoma    Social History:  reports that she has never smoked. She has never been exposed to tobacco smoke. She has never used smokeless tobacco. She reports that she does not drink alcohol and does not use drugs.  Allergies:  Allergies  Allergen Reactions   Combigan  [Brimonidine  Tartrate-Timolol ]     Redness, burning, irritation    Lisinopril  Cough   Other Itching and Rash    SUTURES-(PARTIAL HYSTERECTOMY)    Medications reviewed.    ROS Full ROS performed and is otherwise negative other than what is stated in HPI   BP (!) 145/71   Pulse 91   Ht 5' (1.524 m)   Wt 113 lb 12.8 oz (51.6 kg)   SpO2 98%   BMI 22.23 kg/m   Physical Exam Vitals and nursing note reviewed. Exam conducted with a chaperone present.  Constitutional:      Appearance: Normal appearance.  Pulmonary:     Effort: Pulmonary effort is normal.     Breath sounds: No stridor.  Abdominal:     General: Abdomen is flat. There is no distension.     Palpations: Abdomen is soft. There is no mass.     Tenderness: There is no abdominal tenderness. There is no guarding or rebound.     Hernia: No hernia is present.     Comments: Ostomy in place , pink and patent. Eakin pouch clean w/o any output  Skin:    General: Skin is warm and dry.     Comments: There is a 12 mm in diameter raised lesion superior to the left eyebrow.  There is keratinization and some pearly areas.  This is raised lesion w/o ulcerations, no color change  Neurological:     General: No focal deficit present.     Mental Status: She is alert and oriented to person, place, and time.  Psychiatric:        Mood and Affect: Mood normal.        Behavior: Behavior normal.        Thought  Content: Thought content normal.        Judgment: Judgment normal.     Assessment/Plan: 88 yo w low output EC fistula continued improvement. May try dry dressing next time eakin pouch is changed. I do think by improving her nutritional status will healed the fistula.  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  Discussed with her in detail about options and she wishes  to move forward with punch biopsies so we can obtain a diagnosis.  Discussed with her and the husband in detail the risk the benefit and the possible complications.  They understand  I personally spent a total of 20 minutes in the care of the patient today including performing a medically appropriate exam/evaluation, counseling and educating, placing orders, referring and communicating with other health care professionals, documenting clinical information in the EHR, independently interpreting and reviewing images studies and coordinating care.    Procedure note: Left forehead punch biopsy of skin lesion  Anesthesia: lidocaine  1% w epi  EBL: minimal  Informed consent was obtained the patient was prepped and draped in the usual sterile fashion.  Using lidocaine  we filtrated the area of interest.  A 3 mm punch was performed to the full-thickness skin biopsy and this was sent for permanent pathology.  3-0-old nylon was used to close the defect . I placed steri strips. Hemostasis was achieved with pressure. No complications       Laneta Luna, MD [facility] General Surgeon

## 2024-03-28 NOTE — Patient Instructions (Addendum)
 You have sutures which we will remove in 1 week. You may use Ibuprofen  or Tylenol  as needed for pain control.  You may shower Wednesday. Do not scrub at the area.   Please see your follow-up appointment provided. We will see you back in office to make sure this area is healed and to review the final pathology. If you have any questions or concerns prior to this appointment, call our office and speak with a nurse. We have done a punch biopsy in our office today.Removing a Small Tissue of Skin for Testing (Skin Biopsy): What to Expect A skin biopsy is removing a small tissue of your skin so that it can be tested in the lab. This is usually done to diagnose skin conditions or abnormal changes on your skin (lesion). You may need skin biopsy if you have a skin disease or skin lesion. Tell a health care provider about: Any allergies you have. All medicines you take. These include vitamins, herbs, eye drops, and creams. Any problems you or family members have had with anesthesia. Any bleeding problems you have. Any surgeries you've had. Any medical conditions you have. Whether you're pregnant or may be pregnant. What are the risks? Your provider will talk with you about risks. These may include: Bleeding. Infection. Scarring. Allergies to ointments, anesthesia, or materials used in surgery. What happens before? Medicines Ask about changing or stopping: Any medicines you take. Any vitamins, herbs, or supplements you take. Do not take aspirin or ibuprofen  unless you're told to. Surgery safety For your safety, you may: Need to wash your skin with a soap that kills germs. Get antibiotics. Have your surgery site marked. Have hair removed at the surgery site. General instructions Do not smoke, vape, or use nicotine or tobacco for at least 4 weeks before the surgery. Eat and drink only as told. Ask your health care provider if you'll need someone to take you home from the hospital or  clinic. What happens during a skin biopsy?  You'll be given anesthesia to numb the area. Your provider will take a sample using one of these steps. This depends on the type of skin problem that you have. Shave biopsy. Layers of skin lesion will be shaved away with a sharp blade. After shaving, a gel or ointment may be used to stop bleeding. Punch biopsy. All or part of the lesion will be removed using a surgical tool. This leaves a small hole. The hole may be covered with a gel or ointment. Excisional or incisional biopsy. All or part of the lesion will be removed using a surgical blade. Your skin biopsy site may be closed with stitches. A bandage may be put over the wound. These steps may vary. Ask what you can expect. What happens after? A sample will be sent to a lab for testing. Your skin biopsy site will be watched to make sure that bleeding stops. Talk with your provider about your test results or treatment options. Ask if you need to have more tests. This information is not intended to replace advice given to you by your health care provider. Make sure you discuss any questions you have with your health care provider. Document Revised: 10/08/2023 Document Reviewed: 09/10/2023 Elsevier Patient Education  2025 [employer].

## 2024-03-29 DIAGNOSIS — Z8701 Personal history of pneumonia (recurrent): Secondary | ICD-10-CM | POA: Diagnosis not present

## 2024-03-29 DIAGNOSIS — K651 Peritoneal abscess: Secondary | ICD-10-CM | POA: Diagnosis not present

## 2024-03-29 DIAGNOSIS — K632 Fistula of intestine: Secondary | ICD-10-CM | POA: Diagnosis not present

## 2024-03-29 DIAGNOSIS — D649 Anemia, unspecified: Secondary | ICD-10-CM | POA: Diagnosis not present

## 2024-03-29 DIAGNOSIS — M8580 Other specified disorders of bone density and structure, unspecified site: Secondary | ICD-10-CM | POA: Diagnosis not present

## 2024-03-29 DIAGNOSIS — Z853 Personal history of malignant neoplasm of breast: Secondary | ICD-10-CM | POA: Diagnosis not present

## 2024-03-29 DIAGNOSIS — M1990 Unspecified osteoarthritis, unspecified site: Secondary | ICD-10-CM | POA: Diagnosis not present

## 2024-03-29 DIAGNOSIS — I10 Essential (primary) hypertension: Secondary | ICD-10-CM | POA: Diagnosis not present

## 2024-03-29 DIAGNOSIS — K9403 Colostomy malfunction: Secondary | ICD-10-CM | POA: Diagnosis not present

## 2024-03-29 DIAGNOSIS — R32 Unspecified urinary incontinence: Secondary | ICD-10-CM | POA: Diagnosis not present

## 2024-03-30 DIAGNOSIS — Z8701 Personal history of pneumonia (recurrent): Secondary | ICD-10-CM | POA: Diagnosis not present

## 2024-03-30 DIAGNOSIS — I10 Essential (primary) hypertension: Secondary | ICD-10-CM | POA: Diagnosis not present

## 2024-03-30 DIAGNOSIS — K651 Peritoneal abscess: Secondary | ICD-10-CM | POA: Diagnosis not present

## 2024-03-30 DIAGNOSIS — K632 Fistula of intestine: Secondary | ICD-10-CM | POA: Diagnosis not present

## 2024-03-30 DIAGNOSIS — M1990 Unspecified osteoarthritis, unspecified site: Secondary | ICD-10-CM | POA: Diagnosis not present

## 2024-03-30 DIAGNOSIS — Z853 Personal history of malignant neoplasm of breast: Secondary | ICD-10-CM | POA: Diagnosis not present

## 2024-03-30 DIAGNOSIS — K9403 Colostomy malfunction: Secondary | ICD-10-CM | POA: Diagnosis not present

## 2024-03-30 DIAGNOSIS — R32 Unspecified urinary incontinence: Secondary | ICD-10-CM | POA: Diagnosis not present

## 2024-03-30 DIAGNOSIS — M8580 Other specified disorders of bone density and structure, unspecified site: Secondary | ICD-10-CM | POA: Diagnosis not present

## 2024-03-30 DIAGNOSIS — G43909 Migraine, unspecified, not intractable, without status migrainosus: Secondary | ICD-10-CM | POA: Diagnosis not present

## 2024-03-30 LAB — SURGICAL PATHOLOGY

## 2024-04-04 ENCOUNTER — Ambulatory Visit (INDEPENDENT_AMBULATORY_CARE_PROVIDER_SITE_OTHER): Admitting: Surgery

## 2024-04-04 ENCOUNTER — Encounter: Payer: Self-pay | Admitting: Surgery

## 2024-04-04 ENCOUNTER — Telehealth: Payer: Self-pay | Admitting: Surgery

## 2024-04-04 VITALS — BP 167/78 | HR 85 | Temp 97.9°F | Ht 60.0 in | Wt 113.4 lb

## 2024-04-04 DIAGNOSIS — C44329 Squamous cell carcinoma of skin of other parts of face: Secondary | ICD-10-CM

## 2024-04-04 DIAGNOSIS — C44320 Squamous cell carcinoma of skin of unspecified parts of face: Secondary | ICD-10-CM

## 2024-04-04 NOTE — Telephone Encounter (Signed)
 Patient has been advised of Pre-Admission date/time, and Surgery date at [facility].  Surgery Date: 04/07/24 Preadmission Testing Date: 04/06/24 (phone 1p-4p)  Patient informed of the scheduling process and surgery information given at time of office visit.  Patient has been made aware to call [phone], between 1-3:00pm the day before surgery, to find out what time to arrive for surgery.

## 2024-04-04 NOTE — Progress Notes (Signed)
 Outpatient Surgical Follow Up  04/04/2024  Kristin Miller is an 88 y.o. female.   Chief Complaint  Patient presents with   Routine Post Op    Scalp punch bx 03/28/2024    HPI: S/p punch biopsy showing SCC. She is doing well . Path reviewed with her in detail. I do recommend formal excision. From abdominal perspective she is doing well ,no abd pain, no fevers. Ostomy working. Minimal ouput from eakin pouch.  Past Medical History:  Diagnosis Date   Allergy    Anemia    H/O AS A CHILD   Arthritis    Back pain    Breast cancer of upper-outer quadrant of right female breast (HCC) 06/12/2015   4 millimeter, T1a, N0; ER 90%, PR 0%, HER-2/neu not overexpressed.  Declined radiation therapy.   Collar bone fracture 05/28/2013   Fractured Collar bone on left   Dyspnea    Dysrhythmia    h/o heart skipping beat years ago   Frequent headaches    h/o migraines   Hypertension    Osteopenia 04/2018   Bone density without significant interval change since 2017.   Pneumonia     Past Surgical History:  Procedure Laterality Date   ABDOMINAL HYSTERECTOMY  1970   Partial   BREAST BIOPSY Right 05/28/2015   INVASIVE MAMMARY CARCINOMA    BREAST BIOPSY Right 06/12/2015   Wide excision, sentinel lymph node biopsy.   BREAST EXCISIONAL BIOPSY Right    BREAST LUMPECTOMY WITH AXILLARY LYMPH NODE DISSECTION Right 06/12/2015   4 mm, T1a,N0 withDCIS with clear margins. ER: 90%, PR 0%; Her 2 neu not overexpressed.    CATARACT EXTRACTION W/PHACO Left 02/03/2017   Procedure: CATARACT EXTRACTION PHACO AND INTRAOCULAR LENS PLACEMENT (IOC);  Surgeon: Jaye Fallow, MD;  Location: [facility];  Service: Ophthalmology;  Laterality: Left;  US   00:28 AP% 16.3 CDE 4.68 Fluid pack lot # [id number]   CATARACT EXTRACTION W/PHACO Right 03/01/2019   Procedure: CATARACT EXTRACTION PHACO AND INTRAOCULAR LENS PLACEMENT (IOC)  RIGHT;  Surgeon: Jaye Fallow, MD;  Location: [facility];  Service:  Ophthalmology;  Laterality: Right;   COLONOSCOPY     COLONOSCOPY WITH PROPOFOL  N/A 06/27/2022   Procedure: COLONOSCOPY WITH PROPOFOL ;  Surgeon: Jinny Carmine, MD;  Location: [facility];  Service: Endoscopy;  Laterality: N/A;   COLONOSCOPY WITH PROPOFOL  N/A 10/06/2023   Procedure: COLONOSCOPY WITH PROPOFOL ;  Surgeon: Jinny Carmine, MD;  Location: [facility];  Service: Endoscopy;  Laterality: N/A;   COLOSTOMY REVISION N/A 11/13/2023   Procedure: REVISION, COLOSTOMY;  Surgeon: Jordis Laneta FALCON, MD;  Location: [facility];  Service: General;  Laterality: N/A;   ESOPHAGOGASTRODUODENOSCOPY (EGD) WITH PROPOFOL  N/A 06/27/2022   Procedure: ESOPHAGOGASTRODUODENOSCOPY (EGD) WITH PROPOFOL ;  Surgeon: Jinny Carmine, MD;  Location: [facility];  Service: Endoscopy;  Laterality: N/A;   PARTIAL COLECTOMY N/A 07/29/2022   Procedure: PARTIAL COLECTOMY, open left, RNFA to assist;  Surgeon: Jordis Laneta FALCON, MD;  Location: [facility];  Service: General;  Laterality: N/A;   POLYPECTOMY  10/06/2023   Procedure: POLYPECTOMY;  Surgeon: Jinny Carmine, MD;  Location: [facility];  Service: Endoscopy;;    Family History  Problem Relation Age of Onset   Mental illness Mother        Dementia   Stroke Father    Lung cancer Brother    Breast cancer Other        dx under 67   Brain cancer Granddaughter 12       glioblastoma  Social History:  reports that she has never smoked. She has never been exposed to tobacco smoke. She has never used smokeless tobacco. She reports that she does not drink alcohol and does not use drugs.  Allergies:  Allergies  Allergen Reactions   Combigan  [Brimonidine  Tartrate-Timolol ]     Redness, burning, irritation    Lisinopril  Cough   Other Itching and Rash    SUTURES-(PARTIAL HYSTERECTOMY)    Medications reviewed.    ROS Full ROS performed and is otherwise negative other than what is stated in HPI   BP (!) 167/78   Pulse 85   Temp 97.9 F (36.6 C) (Oral)   Ht  5' (1.524 m)   Wt 113 lb 6.4 oz (51.4 kg)   SpO2 97%   BMI 22.15 kg/m   Physical Exam Vitals and nursing note reviewed. Exam conducted with a chaperone present.  Constitutional:      General: She is not in acute distress.    Appearance: Normal appearance. She is not ill-appearing.  Cardiovascular:     Rate and Rhythm: Normal rate and regular rhythm.  Pulmonary:     Effort: Pulmonary effort is normal. No respiratory distress.     Breath sounds: No stridor.  Abdominal:     General: Abdomen is flat. There is no distension.     Palpations: There is no mass.     Tenderness: There is no abdominal tenderness.     Hernia: No hernia is present.     Comments: Colostomy in place , no output from eakin pouch  Skin:    General: Skin is warm and dry.     Capillary Refill: Capillary refill takes less than 2 seconds.     Comments: 1.4 cm pearly and raised lesion, located superior to left eyebrow stitch from biopsy removed.   Neurological:     General: No focal deficit present.     Mental Status: She is alert and oriented to person, place, and time.  Psychiatric:        Mood and Affect: Mood normal.        Behavior: Behavior normal.        Thought Content: Thought content normal.        Judgment: Judgment normal.     Assessment/Plan: SCC Left Forehead above left eyebrow. D/W the pt in detail about her disease process. I definitively recommend excision. Probably best to do it in the OR, may need frozen section and rotational flap. Procedure  Discussed with the patient and husband  in detail. Risks, benefits and possible complications including but not limited to bleeding, infection, deformity, cosmetic issues and positive margins. They understand and wish to proceed I personally spent a total of 40 minutes in the care of the patient today including performing a medically appropriate exam/evaluation, counseling and educating, placing orders, referring and communicating with other health care  professionals, documenting clinical information in the EHR, independently interpreting and reviewing images studies and coordinating care.   Laneta Luna, MD [facility] General Surgeon

## 2024-04-04 NOTE — Patient Instructions (Signed)
Squamous Cell Carcinoma Squamous cell carcinoma is a common form of skin cancer. It begins in the squamous cells in the outer layer of the skin (epidermis). It occurs most often in parts of the body that are frequently exposed to the sun, such as the face, ears, lips, neck, arms, legs, and hands. However, this condition can occur anywhere on the body, including the inside of the mouth, the genital area, and the opening between the buttocks (anus). If squamous cell carcinoma is treated early, it rarely spreads to other areas of the body (metastasizes). If it is not treated, it can affect nearby tissues. In rare cases, it can spread to other areas of the body. What are the causes? This condition is usually caused by exposure to ultraviolet (UV) light. UV light may come from the sun or from tanning beds.  Other causes include exposure to: Arsenic. Radiation. Toxic tars and oils. In very rare cases, a genetic condition that makes a person sensitive to sunlight (xeroderma pigmentosum) may cause the condition. What increases the risk? This condition is more likely to develop in people who: Are older than 86 years of age. Have light-colored skin, blond or red hair, or blue, green, or gray eyes. Have childhood freckling. Have had repeated sunburns or sun exposure over long periods of time, especially during childhood. Use tanning beds. Have had psoralen and ultraviolet A (PUVA) therapy. Have a weakened body defense (immune system). Have had an HPV (human papillomavirus) infection. Have a history of precancerous lesions (actinic keratosis). Have conditions that cause chronic scarring. These can include burn scars, chronic ulcers, heat injuries, and radiation. Use any products that contain nicotine or tobacco. What are the signs or symptoms?  This condition often starts as a red, pink, or brown growth on the skin. The growths have an irregular surface that may feel rough. In some cases, the growths  are easier to feel than to see. The growths may develop into a sore that does not heal. How is this diagnosed? This condition may be diagnosed with: A physical exam. Removal of a tissue sample to be examined under a microscope (biopsy). How is this treated? Treatment for this condition involves removing the cancerous tissue. The method that is used for this depends on the size and location of the tumor, as well as your overall health. Possible treatments include: Electrodesiccation and curettage. This involves alternately scraping and burning the tumor while using an electric current to control bleeding. Cryosurgery. This involves freezing the tumor with liquid nitrogen. Laser therapy. This uses an intense beam of light to remove the tumor. Photodynamic therapy. A chemical cream is applied to the skin, and light exposure is used to activate the chemical. High-energy rays that kill cancer cells (radiation therapy). This may be used for tumors that are deeper in the tissues. Surgical removal (excision) of the tumor. This involves removing the entire tumor and a small amount of normal skin that surrounds it. Mohs surgery. In this procedure, the cancerous skin cells are removed layer by layer until all of the tumor has been removed. Plastic surgery. The tumor is removed, and healthy skin from another part of the body is used to cover the wound (skin graft). Chemotherapy. This treatment uses medicines that kill cancer cells. Chemotherapy creams or lotions. These may be applied directly to the skin where the tumor is located and may be used for smaller tumors. Targeted therapy. This targets specific parts of cancer cells and the area around them to block  the growth and spread of the cancer. Immunotherapy. This treatment helps your body's immune system fight the cancer cells. Follow these instructions at home:  Avoid being out in the sun. Wear protective clothing, including long-sleeved shirts, long  pants, UV-blocking sunglasses, and a hat when outdoors. Do skin self-exams as told by your health care provider. Look for any new spots or changes in your skin. Do not use any products that contain nicotine or tobacco. These products include cigarettes, chewing tobacco, and vaping devices, such as e-cigarettes. If you need help quitting, ask your health care provider. Keep all follow-up visits. This is important. How is this prevented?  Avoid the sun when it is at its strongest. This is usually between 10:00 a.m. and 4:00 p.m. When you are out in the sun, use sunscreen that has a sun protection factor (SPF) of at least 30. Apply sunscreen at least 30 minutes before exposure to the sun. Reapply sunscreen every 2 hours while you are outside. Also reapply it after swimming and after sweating a lot. Always wear hats, protective clothing, and UV-blocking sunglasses when you are outdoors. Do not use tanning beds. Contact a health care provider if: You notice any new growths or any changes in your skin. You have had a squamous cell carcinoma tumor removed and you notice a new growth in the same location. Summary Squamous cell carcinoma is a common form of skin cancer. It begins in the squamous cells in the outer layer of the skin (epidermis). This condition is usually caused by exposure to ultraviolet (UV) light. Treatment for this condition involves removing the cancerous tissue. The method that is used for this depends on the size and location of the tumor, as well as your overall health. Contact a health care provider if you notice any new growths or any changes in your skin. Keep all follow-up visits. This is important. This information is not intended to replace advice given to you by your health care provider. Make sure you discuss any questions you have with your health care provider. Document Revised: 01/01/2021 Document Reviewed: 01/01/2021 Elsevier Patient Education  2024 [employer].

## 2024-04-04 NOTE — H&P (View-Only) (Signed)
 Outpatient Surgical Follow Up  04/04/2024  Kristin Miller is an 88 y.o. female.   Chief Complaint  Patient presents with   Routine Post Op    Scalp punch bx 03/28/2024    HPI: S/p punch biopsy showing SCC. She is doing well . Path reviewed with her in detail. I do recommend formal excision. From abdominal perspective she is doing well ,no abd pain, no fevers. Ostomy working. Minimal ouput from eakin pouch.  Past Medical History:  Diagnosis Date   Allergy    Anemia    H/O AS A CHILD   Arthritis    Back pain    Breast cancer of upper-outer quadrant of right female breast (HCC) 06/12/2015   4 millimeter, T1a, N0; ER 90%, PR 0%, HER-2/neu not overexpressed.  Declined radiation therapy.   Collar bone fracture 05/28/2013   Fractured Collar bone on left   Dyspnea    Dysrhythmia    h/o heart skipping beat years ago   Frequent headaches    h/o migraines   Hypertension    Osteopenia 04/2018   Bone density without significant interval change since 2017.   Pneumonia     Past Surgical History:  Procedure Laterality Date   ABDOMINAL HYSTERECTOMY  1970   Partial   BREAST BIOPSY Right 05/28/2015   INVASIVE MAMMARY CARCINOMA    BREAST BIOPSY Right 06/12/2015   Wide excision, sentinel lymph node biopsy.   BREAST EXCISIONAL BIOPSY Right    BREAST LUMPECTOMY WITH AXILLARY LYMPH NODE DISSECTION Right 06/12/2015   4 mm, T1a,N0 withDCIS with clear margins. ER: 90%, PR 0%; Her 2 neu not overexpressed.    CATARACT EXTRACTION W/PHACO Left 02/03/2017   Procedure: CATARACT EXTRACTION PHACO AND INTRAOCULAR LENS PLACEMENT (IOC);  Surgeon: Jaye Fallow, MD;  Location: [facility];  Service: Ophthalmology;  Laterality: Left;  US   00:28 AP% 16.3 CDE 4.68 Fluid pack lot # [id number]   CATARACT EXTRACTION W/PHACO Right 03/01/2019   Procedure: CATARACT EXTRACTION PHACO AND INTRAOCULAR LENS PLACEMENT (IOC)  RIGHT;  Surgeon: Jaye Fallow, MD;  Location: [facility];  Service:  Ophthalmology;  Laterality: Right;   COLONOSCOPY     COLONOSCOPY WITH PROPOFOL  N/A 06/27/2022   Procedure: COLONOSCOPY WITH PROPOFOL ;  Surgeon: Jinny Carmine, MD;  Location: [facility];  Service: Endoscopy;  Laterality: N/A;   COLONOSCOPY WITH PROPOFOL  N/A 10/06/2023   Procedure: COLONOSCOPY WITH PROPOFOL ;  Surgeon: Jinny Carmine, MD;  Location: [facility];  Service: Endoscopy;  Laterality: N/A;   COLOSTOMY REVISION N/A 11/13/2023   Procedure: REVISION, COLOSTOMY;  Surgeon: Jordis Laneta FALCON, MD;  Location: [facility];  Service: General;  Laterality: N/A;   ESOPHAGOGASTRODUODENOSCOPY (EGD) WITH PROPOFOL  N/A 06/27/2022   Procedure: ESOPHAGOGASTRODUODENOSCOPY (EGD) WITH PROPOFOL ;  Surgeon: Jinny Carmine, MD;  Location: [facility];  Service: Endoscopy;  Laterality: N/A;   PARTIAL COLECTOMY N/A 07/29/2022   Procedure: PARTIAL COLECTOMY, open left, RNFA to assist;  Surgeon: Jordis Laneta FALCON, MD;  Location: [facility];  Service: General;  Laterality: N/A;   POLYPECTOMY  10/06/2023   Procedure: POLYPECTOMY;  Surgeon: Jinny Carmine, MD;  Location: [facility];  Service: Endoscopy;;    Family History  Problem Relation Age of Onset   Mental illness Mother        Dementia   Stroke Father    Lung cancer Brother    Breast cancer Other        dx under 67   Brain cancer Granddaughter 12       glioblastoma  Social History:  reports that she has never smoked. She has never been exposed to tobacco smoke. She has never used smokeless tobacco. She reports that she does not drink alcohol and does not use drugs.  Allergies:  Allergies  Allergen Reactions   Combigan  [Brimonidine  Tartrate-Timolol ]     Redness, burning, irritation    Lisinopril  Cough   Other Itching and Rash    SUTURES-(PARTIAL HYSTERECTOMY)    Medications reviewed.    ROS Full ROS performed and is otherwise negative other than what is stated in HPI   BP (!) 167/78   Pulse 85   Temp 97.9 F (36.6 C) (Oral)   Ht  5' (1.524 m)   Wt 113 lb 6.4 oz (51.4 kg)   SpO2 97%   BMI 22.15 kg/m   Physical Exam Vitals and nursing note reviewed. Exam conducted with a chaperone present.  Constitutional:      General: She is not in acute distress.    Appearance: Normal appearance. She is not ill-appearing.  Cardiovascular:     Rate and Rhythm: Normal rate and regular rhythm.  Pulmonary:     Effort: Pulmonary effort is normal. No respiratory distress.     Breath sounds: No stridor.  Abdominal:     General: Abdomen is flat. There is no distension.     Palpations: There is no mass.     Tenderness: There is no abdominal tenderness.     Hernia: No hernia is present.     Comments: Colostomy in place , no output from eakin pouch  Skin:    General: Skin is warm and dry.     Capillary Refill: Capillary refill takes less than 2 seconds.     Comments: 1.4 cm pearly and raised lesion, located superior to left eyebrow stitch from biopsy removed.   Neurological:     General: No focal deficit present.     Mental Status: She is alert and oriented to person, place, and time.  Psychiatric:        Mood and Affect: Mood normal.        Behavior: Behavior normal.        Thought Content: Thought content normal.        Judgment: Judgment normal.     Assessment/Plan: SCC Left Forehead above left eyebrow. D/W the pt in detail about her disease process. I definitively recommend excision. Probably best to do it in the OR, may need frozen section and rotational flap. Procedure  Discussed with the patient and husband  in detail. Risks, benefits and possible complications including but not limited to bleeding, infection, deformity, cosmetic issues and positive margins. They understand and wish to proceed I personally spent a total of 40 minutes in the care of the patient today including performing a medically appropriate exam/evaluation, counseling and educating, placing orders, referring and communicating with other health care  professionals, documenting clinical information in the EHR, independently interpreting and reviewing images studies and coordinating care.   Laneta Luna, MD [facility] General Surgeon

## 2024-04-06 ENCOUNTER — Encounter
Admission: RE | Admit: 2024-04-06 | Discharge: 2024-04-06 | Disposition: A | Discharge status: Home / Self Care | Source: Ambulatory Visit | Attending: Surgery | Admitting: Surgery

## 2024-04-06 ENCOUNTER — Other Ambulatory Visit: Payer: Self-pay

## 2024-04-06 HISTORY — DX: Solitary pulmonary nodule: R91.1

## 2024-04-06 HISTORY — DX: Other specified inflammation of unspecified eye, unspecified eyelid: H01.89

## 2024-04-06 HISTORY — DX: Acute on chronic diastolic (congestive) heart failure: I50.33

## 2024-04-06 HISTORY — DX: Other specified inflammations of eyelid: H01.8

## 2024-04-06 HISTORY — DX: Gastro-esophageal reflux disease without esophagitis: K21.9

## 2024-04-06 HISTORY — DX: Malignant neoplasm of colon, unspecified: C18.9

## 2024-04-06 HISTORY — DX: Wedge compression fracture of T11-T12 vertebra, initial encounter for closed fracture: S22.080A

## 2024-04-06 HISTORY — DX: Iron deficiency anemia, unspecified: D50.9

## 2024-04-06 HISTORY — DX: Liver disease, unspecified: K76.9

## 2024-04-06 HISTORY — DX: Fistula of intestine: K63.2

## 2024-04-06 HISTORY — DX: Unspecified glaucoma: H40.9

## 2024-04-06 HISTORY — DX: Atherosclerosis of aorta: I70.0

## 2024-04-06 HISTORY — DX: Unspecified intestinal obstruction, unspecified as to partial versus complete obstruction: K56.609

## 2024-04-06 HISTORY — DX: Personal history of other diseases of the digestive system: Z87.19

## 2024-04-06 HISTORY — DX: Genetic susceptibility to other malignant neoplasm: Z15.09

## 2024-04-06 HISTORY — DX: Genetic susceptibility to other disease: Z15.89

## 2024-04-06 HISTORY — DX: Colostomy status: Z93.3

## 2024-04-06 NOTE — Patient Instructions (Signed)
 Your procedure is scheduled on:04-07-24 Thursday Report to the Registration Desk on the 1st floor of the [facility]. Then proceed to the 2nd floor Surgery Desk. Arrive at 8 am If your arrival time is 6:00 am, do not arrive before that time as the Medical Mall entrance doors do not open until 6:00 am.  REMEMBER: Instructions that are not followed completely may result in serious medical risk, up to and including death; or upon the discretion of your surgeon and anesthesiologist your surgery may need to be rescheduled.  Do not eat food after midnight the night before surgery.  No gum chewing or hard candies.  You may however, drink CLEAR liquids up to 2 hours before you are scheduled to arrive for your surgery. Do not drink anything within 2 hours of your scheduled arrival time.  Clear liquids include: - water   - apple juice without pulp - gatorade (not RED colors) - black coffee or tea (Do NOT add milk or creamers to the coffee or tea) Do NOT drink anything that is not on this list.  One week prior to surgery:Stop NOW (04-06-24) Stop Anti-inflammatories (NSAIDS) such as Advil , Aleve, Ibuprofen , Motrin , Naproxen, Naprosyn and Aspirin based products such as Excedrin, Goody's Powder, BC Powder. Stop ANY OVER THE COUNTER supplements until after surgery (Zinc)  You may however, continue to take Tylenol  if needed for pain up until the day of surgery.  Continue taking all of your other prescription medications up until the day of surgery.  ON THE DAY OF SURGERY ONLY TAKE THESE MEDICATIONS WITH SIPS OF WATER : -amLODipine  (NORVASC )   No Alcohol for 24 hours before or after surgery.  No Smoking including e-cigarettes for 24 hours before surgery.  No chewable tobacco products for at least 6 hours before surgery.  No nicotine patches on the day of surgery.  Do not use any recreational drugs for at least a week (preferably 2 weeks) before your surgery.  Please be advised that the combination  of cocaine and anesthesia may have negative outcomes, up to and including death. If you test positive for cocaine, your surgery will be cancelled.  On the morning of surgery brush your teeth with toothpaste and water , you may rinse your mouth with mouthwash if you wish. Do not swallow any toothpaste or mouthwash.  Do not wear jewelry, make-up, hairpins, clips or nail polish.  For welded (permanent) jewelry: bracelets, anklets, waist bands, etc.  Please have this removed prior to surgery.  If it is not removed, there is a chance that hospital personnel will need to cut it off on the day of surgery.  Do not wear lotions, powders, or perfumes.   Do not shave body hair from the neck down 48 hours before surgery.  Contact lenses, hearing aids and dentures may not be worn into surgery.  Do not bring valuables to the hospital. [facility] is not responsible for any missing/lost belongings or valuables.   Notify your doctor if there is any change in your medical condition (cold, fever, infection).  Wear comfortable clothing (specific to your surgery type) to the hospital.  After surgery, you can help prevent lung complications by doing breathing exercises.  Take deep breaths and cough every 1-2 hours. Your doctor may order a device called an Incentive Spirometer to help you take deep breaths. When coughing or sneezing, hold a pillow firmly against your incision with both hands. This is called "splinting." Doing this helps protect your incision. It also decreases belly discomfort.  If you are being admitted to the hospital overnight, leave your suitcase in the car. After surgery it may be brought to your room.  In case of increased patient census, it may be necessary for you, the patient, to continue your postoperative care in the Same Day Surgery department.  If you are being discharged the day of surgery, you will not be allowed to drive home. You will need a responsible individual to drive  you home and stay with you for 24 hours after surgery.   If you are taking public transportation, you will need to have a responsible individual with you.  Please call the Pre-admissions Testing Dept. at [phone] if you have any questions about these instructions.  Surgery Visitation Policy:  Patients having surgery or a procedure may have two visitors.  Children under the age of 69 must have an adult with them who is not the patient.   Merchandiser, retail to address health-related social needs:  [email or web address]

## 2024-04-07 ENCOUNTER — Other Ambulatory Visit: Payer: Self-pay

## 2024-04-07 ENCOUNTER — Ambulatory Visit

## 2024-04-07 ENCOUNTER — Ambulatory Visit: Admitting: Family Medicine

## 2024-04-07 ENCOUNTER — Encounter: Payer: Self-pay | Admitting: Surgery

## 2024-04-07 ENCOUNTER — Encounter
Admission: RE | Disposition: A | Discharge status: Home / Self Care | Payer: Self-pay | Source: Home / Self Care | Attending: Surgery

## 2024-04-07 ENCOUNTER — Other Ambulatory Visit: Payer: Self-pay | Admitting: Surgery

## 2024-04-07 ENCOUNTER — Ambulatory Visit
Admission: RE | Admit: 2024-04-07 | Discharge: 2024-04-07 | Disposition: A | Discharge status: Home / Self Care | Attending: Surgery | Admitting: Surgery

## 2024-04-07 DIAGNOSIS — I10 Essential (primary) hypertension: Secondary | ICD-10-CM | POA: Diagnosis not present

## 2024-04-07 DIAGNOSIS — R519 Headache, unspecified: Secondary | ICD-10-CM | POA: Diagnosis not present

## 2024-04-07 DIAGNOSIS — C44329 Squamous cell carcinoma of skin of other parts of face: Secondary | ICD-10-CM | POA: Insufficient documentation

## 2024-04-07 DIAGNOSIS — K219 Gastro-esophageal reflux disease without esophagitis: Secondary | ICD-10-CM | POA: Diagnosis not present

## 2024-04-07 DIAGNOSIS — C44320 Squamous cell carcinoma of skin of unspecified parts of face: Secondary | ICD-10-CM

## 2024-04-07 DIAGNOSIS — K449 Diaphragmatic hernia 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 gangrene: Secondary | ICD-10-CM | POA: Diagnosis not present

## 2024-04-07 HISTORY — PX: MELANOMA EXCISION: SHX5266

## 2024-04-07 SURGERY — EXCISION, MELANOMA
Anesthesia: General | Site: Head | Laterality: Left

## 2024-04-07 MED ORDER — LACTATED RINGERS IV SOLN: INTRAVENOUS | Status: DC

## 2024-04-07 MED ORDER — LIDOCAINE HCL (CARDIAC) PF 100 MG/5ML IV SOSY
PREFILLED_SYRINGE | INTRAVENOUS | Status: DC | PRN
  Administered 2024-04-07: 80 mg via INTRAVENOUS

## 2024-04-07 MED ORDER — ORAL CARE MOUTH RINSE: 15.0000 mL | Freq: Once | OROMUCOSAL | Status: AC

## 2024-04-07 MED ORDER — LIDOCAINE HCL (PF) 2 % IJ SOLN
INTRAMUSCULAR | Status: AC
Start: 2024-04-07 — End: 2024-04-07
  Filled 2024-04-07: qty 5

## 2024-04-07 MED ORDER — CEFAZOLIN SODIUM-DEXTROSE 2-4 GM/100ML-% IV SOLN
INTRAVENOUS | Status: AC
  Filled 2024-04-07: qty 100

## 2024-04-07 MED ORDER — CHLORHEXIDINE GLUCONATE 0.12 % MT SOLN
OROMUCOSAL | Status: AC
Start: 2024-04-07 — End: 2024-04-07
  Filled 2024-04-07: qty 15

## 2024-04-07 MED ORDER — DROPERIDOL 2.5 MG/ML IJ SOLN: 0.6250 mg | Freq: Once | INTRAMUSCULAR | Status: DC | PRN

## 2024-04-07 MED ORDER — FENTANYL CITRATE (PF) 100 MCG/2ML IJ SOLN: 25.0000 ug | INTRAMUSCULAR | Status: DC | PRN

## 2024-04-07 MED ORDER — PROPOFOL 10 MG/ML IV BOLUS
INTRAVENOUS | Status: DC | PRN
  Administered 2024-04-07: 100 mg via INTRAVENOUS
  Administered 2024-04-07: 100 ug/kg/min via INTRAVENOUS

## 2024-04-07 MED ORDER — PROPOFOL 1000 MG/100ML IV EMUL
INTRAVENOUS | Status: AC
  Filled 2024-04-07: qty 100

## 2024-04-07 MED ORDER — BUPIVACAINE-EPINEPHRINE (PF) 0.25% -1:200000 IJ SOLN
INTRAMUSCULAR | Status: AC
  Filled 2024-04-07: qty 30

## 2024-04-07 MED ORDER — CHLORHEXIDINE GLUCONATE 0.12 % MT SOLN
15.0000 mL | Freq: Once | OROMUCOSAL | Status: AC
  Administered 2024-04-07: 15 mL via OROMUCOSAL

## 2024-04-07 MED ORDER — CHLORHEXIDINE GLUCONATE CLOTH 2 % EX PADS
6.0000 | MEDICATED_PAD | Freq: Once | CUTANEOUS | Status: AC
  Administered 2024-04-07: 6 via TOPICAL

## 2024-04-07 MED ORDER — PHENYLEPHRINE 80 MCG/ML (10ML) SYRINGE FOR IV PUSH (FOR BLOOD PRESSURE SUPPORT)
PREFILLED_SYRINGE | INTRAVENOUS | Status: AC
  Filled 2024-04-07: qty 10

## 2024-04-07 MED ORDER — CEFAZOLIN SODIUM-DEXTROSE 2-4 GM/100ML-% IV SOLN
2.0000 g | INTRAVENOUS | Status: AC
  Administered 2024-04-07: 2 g via INTRAVENOUS

## 2024-04-07 MED ORDER — 0.9 % SODIUM CHLORIDE (POUR BTL) OPTIME
TOPICAL | Status: DC | PRN
  Administered 2024-04-07: 500 mL

## 2024-04-07 MED ORDER — HYDROCODONE-ACETAMINOPHEN 5-325 MG PO TABS: 1.0000 | ORAL_TABLET | Freq: Four times a day (QID) | ORAL | 0 refills | Status: DC | PRN

## 2024-04-07 MED ORDER — FENTANYL CITRATE (PF) 100 MCG/2ML IJ SOLN
INTRAMUSCULAR | Status: DC | PRN
  Administered 2024-04-07: 50 ug via INTRAVENOUS

## 2024-04-07 MED ORDER — OXYCODONE HCL 5 MG PO TABS
ORAL_TABLET | ORAL | Status: AC
  Filled 2024-04-07: qty 1

## 2024-04-07 MED ORDER — CHLORHEXIDINE GLUCONATE CLOTH 2 % EX PADS: 6.0000 | MEDICATED_PAD | Freq: Once | CUTANEOUS | Status: DC

## 2024-04-07 MED ORDER — OXYCODONE HCL 5 MG PO TABS
5.0000 mg | ORAL_TABLET | Freq: Once | ORAL | Status: AC
  Administered 2024-04-07: 5 mg via ORAL

## 2024-04-07 MED ORDER — FENTANYL CITRATE (PF) 100 MCG/2ML IJ SOLN
INTRAMUSCULAR | Status: AC
  Filled 2024-04-07: qty 2

## 2024-04-07 SURGICAL SUPPLY — 30 items
BLADE SURG 15 STRL LF DISP TIS (BLADE) ×1 IMPLANT
DERMABOND ADVANCED .7 DNX12 (GAUZE/BANDAGES/DRESSINGS) ×1 IMPLANT
DRAPE LAPAROTOMY 100X77 ABD (DRAPES) ×1 IMPLANT
DRAPE SURG 17X23 STRL (DRAPES) IMPLANT
DRSG TEGADERM 4X4.75 (GAUZE/BANDAGES/DRESSINGS) IMPLANT
DRSG TELFA 3X4 N-ADH STERILE (GAUZE/BANDAGES/DRESSINGS) IMPLANT
DRSG XEROFORM 1X8 (GAUZE/BANDAGES/DRESSINGS) IMPLANT
ELECTRODE REM PT RTRN 9FT ADLT (ELECTROSURGICAL) ×1 IMPLANT
GAUZE 4X4 16PLY ~~LOC~~+RFID DBL (SPONGE) ×1 IMPLANT
GLOVE BIO SURGEON STRL SZ7 (GLOVE) ×1 IMPLANT
GOWN STRL REUS W/ TWL LRG LVL3 (GOWN DISPOSABLE) ×2 IMPLANT
KIT TURNOVER KIT A (KITS) ×1 IMPLANT
LABEL OR SOLS (LABEL) ×1 IMPLANT
MANIFOLD NEPTUNE II (INSTRUMENTS) ×1 IMPLANT
NDL HYPO 22X1.5 SAFETY MO (MISCELLANEOUS) ×1 IMPLANT
NEEDLE HYPO 22X1.5 SAFETY MO (MISCELLANEOUS) ×1 IMPLANT
NS IRRIG 500ML POUR BTL (IV SOLUTION) ×1 IMPLANT
PACK BASIN MINOR ARMC (MISCELLANEOUS) ×1 IMPLANT
SPONGE T-LAP 18X18 ~~LOC~~+RFID (SPONGE) ×1 IMPLANT
STAPLER SKIN PROX 35W (STAPLE) IMPLANT
SUT PROLENE 5 0 PS 3 (SUTURE) IMPLANT
SUT SILK 2 0 SH (SUTURE) IMPLANT
SUT SILK 2-0 18XBRD TIE 12 (SUTURE) IMPLANT
SUT VIC AB 0 CT1 36 (SUTURE) ×1 IMPLANT
SUT VIC AB 2-0 CT2 27 (SUTURE) ×1 IMPLANT
SUT VIC AB 2-0 SH 27XBRD (SUTURE) IMPLANT
SUT VICRYL 3-0 (SUTURE) IMPLANT
SUTURE MNCRL 4-0 27XMF (SUTURE) ×1 IMPLANT
SYR 10ML LL (SYRINGE) ×1 IMPLANT
TRAP FLUID SMOKE EVACUATOR (MISCELLANEOUS) ×1 IMPLANT

## 2024-04-07 NOTE — Interval H&P Note (Signed)
 History and Physical Interval Note:  04/07/2024 7:46 AM  Kristin Miller  has presented today for surgery, with the diagnosis of Squamous cell cancer of skin of eyebrow.  The various methods of treatment have been discussed with the patient and family. After consideration of risks, benefits and other options for treatment, the patient has consented to  Procedure(s) with comments: EXCISION, MELANOMA (N/A) - left eyebrow as a surgical intervention.  The patient's history has been reviewed, patient examined, no change in status, stable for surgery.  I have reviewed the patient's chart and labs.  Questions were answered to the patient's satisfaction.     Conny Situ F Bernal Luhman

## 2024-04-07 NOTE — Anesthesia Preprocedure Evaluation (Signed)
 Anesthesia Evaluation  Patient identified by MRN, date of birth, ID band Patient awake    Reviewed: Allergy & Precautions, NPO status , Patient's Chart, lab work & pertinent test results  History of Anesthesia Complications Negative for: history of anesthetic complications  Airway Mallampati: III  TM Distance: <3 FB Neck ROM: full    Dental  (+) Chipped, Poor Dentition, Missing, Upper Dentures, Dental Advidsory Given   Pulmonary neg pulmonary ROS, neg shortness of breath   Pulmonary exam normal        Cardiovascular Exercise Tolerance: Good hypertension, (-) angina Normal cardiovascular exam(-) dysrhythmias      Neuro/Psych  Headaches, neg Seizures  negative psych ROS   GI/Hepatic Neg liver ROS, hiatal hernia,GERD  ,,  Endo/Other  negative endocrine ROS    Renal/GU negative Renal ROS  negative genitourinary   Musculoskeletal   Abdominal   Peds  Hematology negative hematology ROS (+)   Anesthesia Other Findings Past Medical History: No date: Allergy No date: Anemia     Comment:  H/O AS A CHILD No date: Arthritis No date: Back pain 06/12/2015: Breast cancer of upper-outer quadrant of right female  breast (HCC)     Comment:  4 millimeter, T1a, N0; ER 90%, PR 0%, HER-2/neu not               overexpressed.  Declined radiation therapy. 05/28/2013: Collar bone fracture     Comment:  Fractured Collar bone on left No date: Dyspnea No date: Dysrhythmia     Comment:  h/o heart skipping beat years ago No date: Frequent headaches     Comment:  h/o migraines No date: Hypertension 04/2018: Osteopenia     Comment:  Bone density without significant interval change since               2017. No date: Pneumonia  Past Surgical History: 1970: ABDOMINAL HYSTERECTOMY     Comment:  Partial 05/28/2015: BREAST BIOPSY; Right     Comment:  INVASIVE MAMMARY CARCINOMA  06/12/2015: BREAST BIOPSY; Right     Comment:  Wide  excision, sentinel lymph node biopsy. No date: BREAST EXCISIONAL BIOPSY; Right 06/12/2015: BREAST LUMPECTOMY WITH AXILLARY LYMPH NODE DISSECTION;  Right     Comment:  4 mm, T1a,N0 withDCIS with clear margins. ER: 90%, PR               0%; Her 2 neu not overexpressed.  02/03/2017: CATARACT EXTRACTION W/PHACO; Left     Comment:  Procedure: CATARACT EXTRACTION PHACO AND INTRAOCULAR               LENS PLACEMENT (IOC);  Surgeon: Jaye Fallow, MD;                Location: [facility];  Service: Ophthalmology;  Laterality:              Left;  US   00:28 AP% 16.3 CDE 4.68 Fluid pack lot #               [id number] 03/01/2019: CATARACT EXTRACTION W/PHACO; Right     Comment:  Procedure: CATARACT EXTRACTION PHACO AND INTRAOCULAR               LENS PLACEMENT (IOC)  RIGHT;  Surgeon: Jaye Fallow,              MD;  Location: [facility];  Service:               Ophthalmology;  Laterality: Right; No date: COLONOSCOPY  06/27/2022: COLONOSCOPY WITH PROPOFOL ; N/A     Comment:  Procedure: COLONOSCOPY WITH PROPOFOL ;  Surgeon: Jinny Carmine, MD;  Location: [facility];  Service:               Endoscopy;  Laterality: N/A; 06/27/2022: ESOPHAGOGASTRODUODENOSCOPY (EGD) WITH PROPOFOL ; N/A     Comment:  Procedure: ESOPHAGOGASTRODUODENOSCOPY (EGD) WITH               PROPOFOL ;  Surgeon: Jinny Carmine, MD;  Location: [facility];  Service: Endoscopy;  Laterality: N/A; 07/29/2022: PARTIAL COLECTOMY; N/A     Comment:  Procedure: PARTIAL COLECTOMY, open left, RNFA to assist;              Surgeon: Jordis Laneta FALCON, MD;  Location: [facility];                Service: General;  Laterality: N/A;     Reproductive/Obstetrics negative OB ROS                              Anesthesia Physical Anesthesia Plan  ASA: 2  Anesthesia Plan: General   Post-op Pain Management:    Induction: Intravenous  PONV Risk Score and Plan: 4 or greater and  Ondansetron  and Dexamethasone   Airway Management Planned: LMA  Additional Equipment:   Intra-op Plan:   Post-operative Plan: Extubation in OR  Informed Consent: I have reviewed the patients History and Physical, chart, labs and discussed the procedure including the risks, benefits and alternatives for the proposed anesthesia with the patient or authorized representative who has indicated his/her understanding and acceptance.     Dental Advisory Given  Plan Discussed with: Anesthesiologist, CRNA and Surgeon  Anesthesia Plan Comments: (Patient consented for risks of anesthesia including but not limited to:  - adverse reactions to medications - damage to eyes, teeth, lips or other oral mucosa - nerve damage due to positioning  - sore throat or hoarseness - Damage to heart, brain, nerves, lungs, other parts of body or loss of life  Patient voiced understanding and assent.)        Anesthesia Quick Evaluation

## 2024-04-07 NOTE — Transfer of Care (Signed)
 Immediate Anesthesia Transfer of Care Note  Patient: Kristin Miller  Procedure(s) Performed: EXCISION, MELANOMA (Left: Head)  Patient Location: PACU  Anesthesia Type:General  Level of Consciousness: awake  Airway & Oxygen Therapy: Patient Spontanous Breathing  Post-op Assessment: Report given to RN and Post -op Vital signs reviewed and stable  Post vital signs: Reviewed and stable  Last Vitals:  Vitals Value Taken Time  BP 159/78 04/07/24 11:15  Temp    Pulse 77 04/07/24 11:18  Resp 25 04/07/24 11:18  SpO2 96 % 04/07/24 11:18  Vitals shown include unfiled device data.  Last Pain:  Vitals:   04/07/24 0822  TempSrc: Temporal  PainSc: 0-No pain         Complications: There were no known notable events for this encounter.

## 2024-04-07 NOTE — Anesthesia Procedure Notes (Signed)
 Procedure Name: LMA Insertion Date/Time: 04/07/2024 9:33 AM  Performed by: Augustina Braddock R, CRNAPre-anesthesia Checklist: Patient identified, Emergency Drugs available, Suction available, Patient being monitored and Timeout performed Patient Re-evaluated:Patient Re-evaluated prior to induction Oxygen Delivery Method: Circle system utilized Preoxygenation: Pre-oxygenation with 100% oxygen Induction Type: IV induction LMA: LMA inserted LMA Size: 3.0 Number of attempts: 1 Placement Confirmation: positive ETCO2 and breath sounds checked- equal and bilateral Tube secured with: Tape Dental Injury: Teeth and Oropharynx as per pre-operative assessment

## 2024-04-07 NOTE — Discharge Instructions (Addendum)
 Excision of Skin Lesions Excision of a skin lesion is the removal of a section of skin by making small incisions in the skin. Through this process, the lesion is completely removed. This procedure is often done to treat or prevent cancer or infection. It may also be done to improve cosmetic appearance. You may have this procedure to remove: Cancerous (malignant) growths, such as basal cell carcinoma, squamous cell carcinoma, or melanoma. Noncancerous (benign) growths, such as a cyst or lipoma. Growths, such as moles or skin tags, which may be removed for cosmetic reasons. Various excision or surgical techniques may be used depending on your condition, the location of the lesion, and your overall health. Tell your health care provider about: Any allergies you have. All medicines you are taking, including vitamins, herbs, eye drops, creams, and over-the-counter medicines. Any problems you or family members have had with anesthetic medicines. Any bleeding problems you have. Any surgeries you have had. Any medical conditions you have. Whether you are pregnant or may be pregnant. What are the risks? Generally, this is a safe procedure. However, problems may occur, including: Bleeding. Infection. Scarring. Recurrence of the cyst, lipoma, or cancer. Allergic reaction to anesthetics, surgical materials, or ointments. Damage to nerves, blood vessels, muscles, or other structures. What happens before the procedure? Medicines Ask your health care provider about: Changing or stopping your regular medicines. This is especially important if you are taking diabetes medicines or blood thinners. Taking medicines such as aspirin  and ibuprofen. These medicines can thin your blood. Do not take these medicines unless your health care provider tells you to take them. Taking over-the-counter medicines, vitamins, herbs, and supplements. General instructions Do not use any products that contain nicotine or  tobacco. These products include cigarettes, chewing tobacco, and vaping devices, such as e-cigarettes. If you need help quitting, ask your health care provider. Follow instructions from your health care provider about eating or drinking restrictions. Ask your health care provider: How your surgery site will be marked. What steps will be taken to help prevent infection. These steps may include: Removing hair at the surgery site. Washing skin with a germ-killing soap. Taking antibiotic medicine. Ask your health care provider if you will need someone to take you home from the hospital or clinic after the procedure. What happens during the procedure?  You will be given a medicine to numb the area (local anesthetic). Your health care provider will remove the lesions using one of the following excision techniques. Complete surgical excision. This procedure may be done to treat a cancerous growth or a noncancerous cyst or lesion. A small scalpel or scissors will be used to gently cut around and under the lesion until it is completely removed. If bleeding occurs, it will be stopped with a device that delivers heat (electrocautery). The edges of the wound may be stitched (sutured) together. A bandage (dressing) will be applied. Samples will be sent to a lab for testing. Excision of a cyst. An incision will be made on the cyst. The entire cyst will be removed through the incision. The incision may be closed with sutures. Shave excision. This may be done to remove a mole or other small growths. A small blade or scalpel will be used to shave off the lesion. The wound is usually left to heal on its own without sutures. The sample may be sent to a lab for testing. Punch excision. This may be done to completely remove a mole or other small growths. A small tool that  is like a cookie cutter or a hole punch is used to cut a circle shape out of the skin. The outer edges of the skin will be sutured  together. The sample may be sent to a lab for testing. Mohs micrographic surgery. This is usually done to treat skin cancer. This type of excision is mostly used on the face and ears. This procedure is minimally invasive, and it ensures the best cosmetic outcome. A scalpel or a loop instrument will be used to remove layers of the lesion until all the abnormal or cancerous tissue has been removed. The wound may be sutured, depending on its size. The tissue will be checked under a microscope right away. The procedure may vary among health care providers and hospitals. At the end of any of these procedures, antibiotic ointment will be applied as needed. What happens after the procedure? Talk with your health care provider to discuss any test results, treatment options, and if necessary, the need for more tests. Keep all follow-up visits. This is important. Summary Excision of a skin lesion is the removal of a section of skin by making small incisions in the skin. This procedure is often done to treat or prevent skin cancer, remove benign growths, or it may be done to improve cosmetic appearance. Various excision or surgical techniques may be used depending on your condition, the location of the lesion, and your overall health. After the procedure, talk with your health care provider to discuss any test results, treatment options, and if necessary, the need for more tests. Keep all follow-up visits. This is important. This information is not intended to replace advice given to you by your health care provider. Make sure you discuss any questions you have with your health care provider. Document Revised: 02/04/2021 Document Reviewed: 02/05/2021 Elsevier Patient Education  2025 [employer].

## 2024-04-07 NOTE — Op Note (Addendum)
 Excision of LEft Forehead skin cancer Rotational Flap to cover defect, total flap measuring 8 x7 cm = 56cm2   Pre-operative Diagnosis: Squamous cell  Left eyebrow    Post-operative Diagnosis: Same   Surgeon: Laneta Luna,  MD FACS  Anesthesia: General LMA  Procedure:   Findings: 2.1cm left lateral frontal cancer Frozen with cancer involving full thickness skin Excision down to orbital muscle   Estimated Blood Loss: Minimal         Drains: None         Specimens: Skin       Complications: none                 Condition: Stable  Procedure Details  The patient was seen again in the Holding Room. The benefits, complications, treatment options, and expected outcomes were discussed with the patient. The risks of bleeding, infection, recurrence of symptoms, failure to resolve symptoms, hematoma, seroma, open wound, cosmetic deformity, and the need for further surgery were discussed.  The patient was taken to Operating Room, identified  and the procedure verified.  A Time Out was held and the above information confirmed.  Prior to the induction of general anesthesia, antibiotic prophylaxis was administered. VTE prophylaxis was in place. Appropriate anesthesia was then administered and tolerated well.  THe patient was prepped and draped in the usual sterile fashion. Total measure diameter of the cancer including margins is 21mm, We performed an full thickness excision by incising the skin with a 15 blade knife in a drop tear shape. ElectoCautery used to remove the specimen in a full thickness fashion down to the muscle. Grossly no evidence of disease was left and gross good margins were obtained. Excellent hemostasis achieved. Specimen removed and marked and sent for frozen section. Rotational flap needed to be done to cover the defect. A curvilinear counter incision was created, the total area that was mobilized was 8x 7 cms this was done using cautery circumferentially to detach the  flap form the orbicularis and frontalis muscles. We were able to mobilize the skin very nicely and the flap was closed in a two layer fashion using multiple 2-0 vicryl and for the skin multiple 5-0 prolenes were used. There was excellent perfusion of the flap with no evidence of tension or ischemia. Xerofrom and tegaderm was placed. I did discussed frozen section with pathologist. There was full thickness involvement of the skin. The deep margin was the closest margin. I did discussed with him that my excision was down to the muscle, he did not see any cancer involvement in any muscle.   Correlating clinical picture , operative findings and  pathology I did not think appropriate to excised muscle that could lead to deformity and was not certain about the benefits of a more radical excision.  No complications  Patient was taken to the recovery room in stable condition.   Laneta Luna , MD, FACS

## 2024-04-08 ENCOUNTER — Telehealth: Payer: Self-pay | Admitting: Surgery

## 2024-04-08 ENCOUNTER — Encounter: Payer: Self-pay | Admitting: Surgery

## 2024-04-08 NOTE — Telephone Encounter (Signed)
 Pt husband said that the place on his wife forehead is bleeding and the dr told them to keep the bandage on till next weeks appt, but says it is leaking very bad and wants to kow what he needs to do. Pt [phone]

## 2024-04-08 NOTE — Telephone Encounter (Signed)
 Called and spoke with the patient. She says that now the bleeding has stopped. She has not been using ice at all since surgery. I instructed her to keep a clean dry bandage on the area. She will use ice to the area 15 minutes on, 2 hours off today. She is aware to continue to use ice a few times a day to keep the swelling down and to prevent any further bleeding. I instructed her to use ice and pressure to the area if it starts to bleed again. She is to call if the bleeding continues.

## 2024-04-11 ENCOUNTER — Encounter: Payer: Self-pay | Admitting: Family Medicine

## 2024-04-11 ENCOUNTER — Ambulatory Visit (INDEPENDENT_AMBULATORY_CARE_PROVIDER_SITE_OTHER): Admitting: Family Medicine

## 2024-04-11 VITALS — BP 133/84 | HR 78 | Temp 97.6°F | Resp 18 | Ht 60.0 in | Wt 115.0 lb

## 2024-04-11 DIAGNOSIS — R634 Abnormal weight loss: Secondary | ICD-10-CM | POA: Diagnosis not present

## 2024-04-11 DIAGNOSIS — N179 Acute kidney failure, unspecified: Secondary | ICD-10-CM

## 2024-04-11 DIAGNOSIS — I10 Essential (primary) hypertension: Secondary | ICD-10-CM

## 2024-04-11 LAB — SURGICAL PATHOLOGY

## 2024-04-11 NOTE — Assessment & Plan Note (Signed)
 She is on amlodipine  5 mg daily and blood pressure is controlled 138/84.  No peripheral edema.  Notes no difficulty taking this medication.  Continue on amlodipine  5 mg daily.

## 2024-04-11 NOTE — Assessment & Plan Note (Addendum)
 She did not gain any weight this month but she did not lose any either.  She is at 115.  She reports that she is eating 3 meals a day and sometimes eats snacks.  The amount of food she is eating is also increased.  She stopped taking remeron .

## 2024-04-11 NOTE — Progress Notes (Signed)
 Established Patient Office Visit  Subjective   Patient ID: Kristin Miller, female    DOB: 05-30-1936  Age: 88 y.o. MRN: [id number]  Chief Complaint  Patient presents with   Medical Management of Chronic Issues    HPI Kristin Miller with history of colon cancer (07/2022), s/p sigmoid colectomy and pelvic wall resection with negative margins, osteoporosis with T12 compression fracture, iron  deficiency anemia, breast cancer (ER +, PR negative, HER2/NEU no over expression) s/p lumpectomy and 5 weeks of adjuvant XRT, colonoscopy (10/06/2023, recurrence of colon cancer with large mass in descending colon), S/p left colectomy with end colostomy followed by revision of the stoma (necrosis of the original stoma), small bowel fistula, intra-abdominal abscess that was drained by IR. Initially she had 4 drains. She has an IT consultant the original ostomy and the fistula. She had a long hospital stay from 11/13/2023 to 12/15/2023, recent dehydration with Cr. 1.27   Discussed the use of AI scribe software for clinical note transcription with the patient, who gave verbal consent to proceed.  History of Present Illness   Kristin Miller is an 88 year old female with squamous cell carcinoma who presents for follow-up after surgery. She is accompanied by her caregiver, who assists with her ostomy care.  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 Since returning home there is no pain. She is concerned about the potential for recurrence and the extent of the surgery. She is not to change the bandage and she has a follow-up appointment later this week.    Her nutritional intake has improved, and she is eating better, including meals like bacon and egg sandwiches. She did not gain any weight over the last month but she did not loose any weight either.    She has two ostomy bags, one of which has minimal output and does not require frequent emptying.  Her caregiver assists with changing the bags due to her difficulty seeing and managing them independently.  Her losartan / hydrochlorothiazide  was stopped due to dehydration affecting her kidneys. She feels slightly better with improved hydration but still lacks energy.  No swelling in her ankles and no significant emotional distress, although she acknowledges feeling 'a little bit' blue due to her circumstances. She denies any thoughts of self-harm and expresses self-love.  She has not yet received her flu or COVID vaccines. She typically waits until October for the flu vaccine.       Objective:     BP 133/84 (BP Location: Left Arm, Patient Position: Sitting, Cuff Size: Normal)   Pulse 78   Temp 97.6 F (36.4 C) (Oral)   Resp 18   Ht 5' (1.524 m)   Wt 115 lb (52.2 kg)   SpO2 98%   BMI 22.46 kg/m    Physical Exam Vitals reviewed.  Constitutional:      Appearance: Normal appearance.  HENT:     Head: Normocephalic.  Eyes:     General:        Right eye: No discharge.        Left eye: No discharge.  Cardiovascular:     Rate and Rhythm: Normal rate.  Pulmonary:     Effort: Pulmonary effort is normal.  Neurological:     Mental Status: She is alert and oriented to person, place, and time.  Psychiatric:        Mood and Affect: Mood normal.  Behavior: Behavior normal.        Thought Content: Thought content normal.        Judgment: Judgment normal.          No results found for any visits on 04/11/24.    The ASCVD Risk score (Arnett DK, et al., 2019) failed to calculate for the following reasons:   The 2019 ASCVD risk score is only valid for ages 75 to 67    Assessment & Plan:  Primary hypertension Assessment & Plan: Blood pressure is well-controlled on amlodipine  5 mg daily.  No peripheral edema and no difficulty taking medication.  Continue amlodipine  5 mg daily.  Orders: -     CMP14+EGFR  AKI (acute kidney injury) (HCC) Assessment & Plan: Her  creatinine got up to 1.27 and her baseline is typically 0.5.  Stopped her losartan  HCTZ to allow her kidney to recover.  Checking CMP today   Weight loss Assessment & Plan: She did not gain any weight this month but she did not lose any either.  She is at 115.  She reports that she is eating 3 meals a day and sometimes eats snacks.  The amount of food she is eating is also increased.  She stopped taking remeron .        Return in about 4 weeks (around 05/09/2024).    Alexandr Oehler K Zarielle Cea, MD

## 2024-04-11 NOTE — Assessment & Plan Note (Signed)
 Blood pressure is well-controlled on amlodipine  5 mg daily.  No peripheral edema and no difficulty taking medication.  Continue amlodipine  5 mg daily.

## 2024-04-11 NOTE — Assessment & Plan Note (Signed)
 Her creatinine got up to 1.27 and her baseline is typically 0.5.  Stopped her losartan  HCTZ to allow her kidney to recover.  Checking CMP today

## 2024-04-12 ENCOUNTER — Other Ambulatory Visit: Payer: Self-pay | Admitting: Family Medicine

## 2024-04-12 ENCOUNTER — Ambulatory Visit: Payer: Self-pay | Admitting: Family Medicine

## 2024-04-12 DIAGNOSIS — E876 Hypokalemia: Secondary | ICD-10-CM

## 2024-04-12 LAB — CMP14+EGFR
ALT: 6 IU/L (ref 0–32)
AST: 15 IU/L (ref 0–40)
Albumin: 4 g/dL (ref 3.7–4.7)
Alkaline Phosphatase: 84 IU/L (ref 48–129)
BUN/Creatinine Ratio: 18 (ref 12–28)
BUN: 14 mg/dL (ref 8–27)
Bilirubin Total: 0.4 mg/dL (ref 0.0–1.2)
CO2: 19 mmol/L — ABNORMAL LOW (ref 20–29)
Calcium: 9.5 mg/dL (ref 8.7–10.3)
Chloride: 105 mmol/L (ref 96–106)
Creatinine, Ser: 0.8 mg/dL (ref 0.57–1.00)
Globulin, Total: 2.9 g/dL (ref 1.5–4.5)
Glucose: 87 mg/dL (ref 70–99)
Potassium: 3.4 mmol/L — ABNORMAL LOW (ref 3.5–5.2)
Sodium: 142 mmol/L (ref 134–144)
Total Protein: 6.9 g/dL (ref 6.0–8.5)
eGFR: 72 mL/min/1.73 (ref >59)

## 2024-04-12 MED ORDER — POTASSIUM CHLORIDE ER 10 MEQ PO TBCR: 10.0000 meq | EXTENDED_RELEASE_TABLET | Freq: Every day | ORAL | 5 refills | Status: DC

## 2024-04-13 ENCOUNTER — Encounter: Payer: Self-pay | Admitting: Surgery

## 2024-04-13 ENCOUNTER — Ambulatory Visit (INDEPENDENT_AMBULATORY_CARE_PROVIDER_SITE_OTHER): Admitting: Surgery

## 2024-04-13 VITALS — BP 170/87 | HR 81 | Temp 98.0°F | Ht 60.0 in | Wt 115.6 lb

## 2024-04-13 DIAGNOSIS — Z09 Encounter for follow-up examination after completed treatment for conditions other than malignant neoplasm: Secondary | ICD-10-CM

## 2024-04-13 DIAGNOSIS — C44329 Squamous cell carcinoma of skin of other parts of face: Secondary | ICD-10-CM

## 2024-04-13 NOTE — Patient Instructions (Signed)
Melanoma Melanoma is a form of skin cancer that begins in the skin cells that produce pigment (melanocytes). Melanoma usually starts as a mole or growth (lesion) on the skin and can spread to other areas of the body. If found early and treated, many cases of melanoma are curable. What are the causes? The exact cause of this condition is not known. What increases the risk? The following factors may make you more likely to develop this condition: Spending a lot of time in the sun, under a sunlamp, or in a tanning booth. Having a sunburn that blisters. The more blistering sunburns you have had, the higher your risk for melanoma becomes. Living in a hot, sunny climate, or where the sun is strong. Having fair skin that does not tan easily. Having a personal or family history of melanoma. Having many skin moles or unusual moles (dysplastic nevi). Having a weakened body defense system (immune system). What are the signs or symptoms? Symptoms of this condition include: ABCDE changes in a mole. ABCDE stands for: Asymmetry. This means the mole has an irregular shape. It is not round or oval. Border. This means the mole has an irregular or bumpy border. Color. This means the mole has multiple colors in it, including brown, black, blue, red, or tan. Diameter. This means the mole is more than 0.2 inches (6 mm) across. Evolving. This refers to any unusual changes or symptoms in the mole, such as pain, itching, stinging, sensitivity, or bleeding. A new mole or skin lesion. Symptoms that melanoma may have spread to other areas of the body include: Swollen lymph nodes. Shortness of breath. Bone pain. Headache. Seizures. Visual problems. How is this diagnosed? This condition is diagnosed by examining a tissue sample from the mole (biopsy). You may also have other tests, including: Blood tests. Chest X-rays. Sentinel node biopsy. A CT scan. A bone scan. If melanoma is confirmed, it will be staged to  determine its severity and extent. Staging is an assessment of: The size of the growth or lesion. Whether the cancer has spread. Where the cancer has spread. How is this treated? This condition is treated with surgery to remove the cancer as well as some of the tissue around where the cancer was found. Your lymph nodes may also be removed during the surgery. If melanoma has spread to other areas, such as the lymph nodes, liver, lungs, bone, or brain, you will need additional treatment. This may include: Targeted therapy. This treatment uses medicines that target specific genetic mutations linked to melanoma. Biological therapy, also called immunotherapy. This treatment acts on the immune system to help kill cancer cells. Radiation therapy. This treatment uses high-energy rays to kill cancer cells. Chemotherapy. This treatment uses medicine that kills cancer cells. A combination of surgery and other treatments may be recommended. Melanoma can come back after treatment (recur). Follow these instructions at home: Lifestyle  Stay out of the sun, especially during peak mid-afternoon hours. Take the following precautions every day, even on cloudy days and in the winter, and even if you will be outdoors for only a short period of time. Wear long sleeves, a hat, and sunglasses that block UV light when possible. Regularly apply a sunscreen with an SPF of 30 or higher. Consider cosmetics that contain an SPF. Limit sun exposure in the middle part of the day. Do not use any products that contain nicotine or tobacco. These products include cigarettes, chewing tobacco, and vaping devices, such as e-cigarettes. If you need help  quitting, ask your health care provider. General instructions Take over-the-counter and prescription medicines only as told by your health care provider. Learn as much as you can about your condition. Keep all follow-up visits. This is important. You will need to have regular visits  during and after treatment as a part of your care. Where to find more information National Cancer Institute: www.cancer.gov American Cancer Society: www.cancer.org American Academy of Dermatology: InfoExam.si Contact a health care provider if: You have any new growths or changes in your skin. You have had melanoma removed and you notice a new growth in the same location. You have any new pain or new symptoms. Get help right away if: You have a seizure. You have chest pain. You have trouble breathing. You have a fever. You have bleeding that does not stop. These symptoms may represent a serious problem that is an emergency. Do not wait to see if the symptoms will go away. Get medical help right away. Call your local emergency services (911 in the [place] Do not drive yourself to the hospital. Summary Melanoma is a form of skin cancer that begins in the skin cells that produce pigment (melanocytes). Melanoma usually starts as a mole or growth (lesion) on the skin and can spread to other areas of the body. This condition is diagnosed by examining a tissue sample from a mole (biopsy). If found early and treated, many cases of melanoma are curable. A combination of surgery and other treatments may be recommended. Keep all follow-up visits. This is important. You will need to have regular visits during and after treatment as a part of your care. This information is not intended to replace advice given to you by your health care provider. Make sure you discuss any questions you have with your health care provider. Document Revised: 12/13/2020 Document Reviewed: 12/13/2020 Elsevier Patient Education  2024 [employer].

## 2024-04-14 NOTE — Progress Notes (Signed)
 S/p SCC excision face w flap Doing well No issues Path d/w pt  PE NAD Wound healing well, flap viable good perfusion and wound healing well. Stitches removed and steristips place, no infection  A./P Doing well w/o complications RTC 2 wks

## 2024-04-20 NOTE — Anesthesia Postprocedure Evaluation (Signed)
 Anesthesia Post Note  Patient: Kristin Miller  Procedure(s) Performed: EXCISION, MELANOMA (Left: Head)  Patient location during evaluation: PACU Anesthesia Type: General Level of consciousness: awake and alert Pain management: pain level controlled Vital Signs Assessment: post-procedure vital signs reviewed and stable Respiratory status: spontaneous breathing, nonlabored ventilation, respiratory function stable and patient connected to nasal cannula oxygen Cardiovascular status: blood pressure returned to baseline and stable Postop Assessment: no apparent nausea or vomiting Anesthetic complications: no   There were no known notable events for this encounter.   Last Vitals:  Vitals:   04/07/24 1145 04/07/24 1215  BP: (!) 154/65 (!) 148/76  Pulse: 72 74  Resp: 17 16  Temp: (!) 36.1 C 36.6 C  SpO2: 98% 97%    Last Pain:  Vitals:   04/07/24 1215  TempSrc: Temporal  PainSc: 0-No pain                 Prentice Murphy

## 2024-04-25 DIAGNOSIS — Z933 Colostomy status: Secondary | ICD-10-CM | POA: Diagnosis not present

## 2024-04-25 DIAGNOSIS — L24B3 Irritant contact dermatitis related to fecal or urinary stoma or fistula: Secondary | ICD-10-CM | POA: Diagnosis not present

## 2024-04-27 ENCOUNTER — Encounter: Payer: Self-pay | Admitting: Surgery

## 2024-04-27 ENCOUNTER — Ambulatory Visit (INDEPENDENT_AMBULATORY_CARE_PROVIDER_SITE_OTHER): Admitting: Surgery

## 2024-04-27 VITALS — BP 150/80 | HR 80 | Ht 60.0 in | Wt 113.8 lb

## 2024-04-27 DIAGNOSIS — Z09 Encounter for follow-up examination after completed treatment for conditions other than malignant neoplasm: Secondary | ICD-10-CM

## 2024-04-27 DIAGNOSIS — C44329 Squamous cell carcinoma of skin of other parts of face: Secondary | ICD-10-CM

## 2024-04-27 NOTE — Patient Instructions (Addendum)
 Use the Xeroform dressing on the wound and cover with a bandage/dressing. Change this every 2-3 days.     Follow-up with our office in 2 weeks.   Please call and ask to speak with a nurse if you develop questions or concerns.

## 2024-04-28 ENCOUNTER — Other Ambulatory Visit: Payer: Self-pay

## 2024-04-28 DIAGNOSIS — C189 Malignant neoplasm of colon, unspecified: Secondary | ICD-10-CM

## 2024-04-28 NOTE — Progress Notes (Signed)
 S/p SCC excision Doing well Exam shows small 2 mm dehiscence on inferior corner, no infection, placed xeroform and tegaderm Plan may continue xeroform, I expect the small defect will fill with granulation and scar tissue, no infection RTC 2 weeks

## 2024-04-29 ENCOUNTER — Inpatient Hospital Stay: Admitting: Oncology

## 2024-04-29 ENCOUNTER — Inpatient Hospital Stay: Attending: Oncology

## 2024-04-29 ENCOUNTER — Encounter: Payer: Self-pay | Admitting: Oncology

## 2024-04-29 VITALS — BP 155/85 | HR 58 | Temp 97.4°F | Resp 18 | Wt 116.4 lb

## 2024-04-29 DIAGNOSIS — C189 Malignant neoplasm of colon, unspecified: Secondary | ICD-10-CM | POA: Diagnosis not present

## 2024-04-29 DIAGNOSIS — C186 Malignant neoplasm of descending colon: Secondary | ICD-10-CM | POA: Diagnosis not present

## 2024-04-29 DIAGNOSIS — Z1509 Genetic susceptibility to other malignant neoplasm: Secondary | ICD-10-CM | POA: Diagnosis not present

## 2024-04-29 DIAGNOSIS — C50511 Malignant neoplasm of lower-outer quadrant of right female breast: Secondary | ICD-10-CM | POA: Diagnosis not present

## 2024-04-29 DIAGNOSIS — Z1589 Genetic susceptibility to other disease: Secondary | ICD-10-CM | POA: Diagnosis not present

## 2024-04-29 DIAGNOSIS — D509 Iron deficiency anemia, unspecified: Secondary | ICD-10-CM | POA: Diagnosis not present

## 2024-04-29 DIAGNOSIS — Z853 Personal history of malignant neoplasm of breast: Secondary | ICD-10-CM | POA: Diagnosis not present

## 2024-04-29 LAB — CBC WITH DIFFERENTIAL (CANCER CENTER ONLY)
Abs Immature Granulocytes: 0.04 K/uL (ref 0.00–0.07)
Basophils Absolute: 0.1 K/uL (ref 0.0–0.1)
Basophils Relative: 1 %
Eosinophils Absolute: 0.1 K/uL (ref 0.0–0.5)
Eosinophils Relative: 1 %
HCT: 39.6 % (ref 36.0–46.0)
Hemoglobin: 12.8 g/dL (ref 12.0–15.0)
Immature Granulocytes: 0 %
Lymphocytes Relative: 28 %
Lymphs Abs: 2.8 K/uL (ref 0.7–4.0)
MCH: 28.9 pg (ref 26.0–34.0)
MCHC: 32.3 g/dL (ref 30.0–36.0)
MCV: 89.4 fL (ref 80.0–100.0)
Monocytes Absolute: 0.8 K/uL (ref 0.1–1.0)
Monocytes Relative: 8 %
Neutro Abs: 6.2 K/uL (ref 1.7–7.7)
Neutrophils Relative %: 62 %
Platelet Count: 298 K/uL (ref 150–400)
RBC: 4.43 MIL/uL (ref 3.87–5.11)
RDW: 13.2 % (ref 11.5–15.5)
WBC Count: 10 K/uL (ref 4.0–10.5)
nRBC: 0 % (ref 0.0–0.2)

## 2024-04-29 LAB — CMP (CANCER CENTER ONLY)
ALT: 12 U/L (ref 0–44)
AST: 19 U/L (ref 15–41)
Albumin: 3.9 g/dL (ref 3.5–5.0)
Alkaline Phosphatase: 72 U/L (ref 38–126)
Anion gap: 9 (ref 5–15)
BUN: 14 mg/dL (ref 8–23)
CO2: 23 mmol/L (ref 22–32)
Calcium: 8.9 mg/dL (ref 8.9–10.3)
Chloride: 104 mmol/L (ref 98–111)
Creatinine: 0.85 mg/dL (ref 0.44–1.00)
GFR, Estimated: >60 mL/min (ref >60)
Glucose, Bld: 108 mg/dL — ABNORMAL HIGH (ref 70–99)
Potassium: 3.6 mmol/L (ref 3.5–5.1)
Sodium: 136 mmol/L (ref 135–145)
Total Bilirubin: 0.6 mg/dL (ref 0.0–1.2)
Total Protein: 7.4 g/dL (ref 6.5–8.1)

## 2024-04-29 LAB — GENETIC SCREENING ORDER

## 2024-04-29 NOTE — Assessment & Plan Note (Signed)
 4 millimeter, T1a, N0; ER 90%, PR 0%, HER-2/neu negative. Declined radiation therapy.  She finished 6 years of Letrozole  ad now is off treatment.  previously managed by Dr. Dessa.  Annual screening mammogram-next due in January 2026

## 2024-04-29 NOTE — Assessment & Plan Note (Signed)
 Pathology results are reviewed and discussed with patient. 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 Benefit of adjuvant 5-FU monotherapy in stage II dMMR patients  is uncertain. Adjuvant chemotherapy was not offered.  Initial post operative Signatera ctDNA was positive. T4b disease, s/p radiation  Dec 2024 Repeat Signatera ctDNA negative.   pT2N0 synchronous colon cancer initially found on surveillance colonoscopy status post colon resection.  Stage I, no need for adjuvant chemotherapy.  Continue close surveillance with CT images and colonoscopy.

## 2024-04-29 NOTE — Progress Notes (Signed)
 Hematology/Oncology Progress note [phone] [phone]       CHIEF COMPLAINTS/PURPOSE OF CONSULTATION:  Descending colon cancer, Lynch syndrome  ASSESSMENT & PLAN:   Cancer Staging  Colon adenocarcinoma [facility]) Staging form: Colon and Rectum, AJCC 8th Edition - Clinical stage from 06/27/2022: Stage IIC (cT4b, cN0, cM0) - Signed by Babara Call, MD on 07/05/2022 - Pathologic stage from 07/29/2022: Stage IIC (pT4b, pN0, cM0) - Signed by Babara Call, MD on 10/03/2022   Colon adenocarcinoma [facility]) Pathology results are reviewed and discussed with patient. 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 Benefit of adjuvant 5-FU monotherapy in stage II dMMR patients  is uncertain. Adjuvant chemotherapy was not offered.  Initial post operative Signatera ctDNA was positive. T4b disease, s/p radiation  Dec 2024 Repeat Signatera ctDNA negative.   pT2N0 synchronous colon cancer initially found on surveillance colonoscopy status post colon resection.  Stage I, no need for adjuvant chemotherapy.  Continue close surveillance with CT images and colonoscopy.  Primary cancer of lower-outer quadrant of right breast (HCC) 4 millimeter, T1a, N0; ER 90%, PR 0%, HER-2/neu negative. Declined radiation therapy.  She finished 6 years of Letrozole  ad now is off treatment.  previously managed by Dr. Dessa.  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  Increased risk of developing colon cancer, ovarian cancer, endometrial cancer, etc.   Patient has discussed genetic testing results with genetic counselor, and she knows the recommendation of first-degree relatives to be screened. Patient has had partial hysterectomy, ? One ovary left. She will get surveillance CT scan for colon cancer.  Continue annual screening mammogram.    Orders Placed  This Encounter  Procedures   CT CHEST ABDOMEN PELVIS W CONTRAST    Standing Status:   Future    Expected Date:   10/02/2024    Expiration Date:   04/29/2025    If indicated for the ordered procedure, I authorize the administration of contrast media per Radiology protocol:   Yes    Does the patient have a contrast media/X-ray dye allergy?:   No    Preferred imaging location?:   [facility]    If indicated for the ordered procedure, I authorize the administration of oral contrast media per Radiology protocol:   Yes   MM 3D SCREENING MAMMOGRAM BILATERAL BREAST    Standing Status:   Future    Expected Date:   07/27/2024    Expiration Date:   04/29/2025    Reason for Exam (SYMPTOM  OR DIAGNOSIS REQUIRED):   screening mammo    Preferred imaging location?:    [facility]    Standing Status:   Future    Expected Date:   10/28/2024    Expiration Date:   01/26/2025   CBC with Differential (Cancer Center Only)    Standing Status:   Future    Expected Date:   10/28/2024    Expiration Date:   01/26/2025   CMP (Cancer Center only)    Standing Status:   Future    Expected Date:   10/28/2024    Expiration Date:   01/26/2025   Genetic Screening Order    Standing Status:   Future    Expected Date:   10/28/2024    Expiration Date:   01/26/2025   Genetic Screening Order    Signatera  Standing Status:   Future    Expected Date:   07/30/2024    Expiration Date:   04/29/2025   Follow up in 6 months.  All questions were answered. The patient knows to call the clinic with any problems, questions or concerns.  Kristin Cap, MD, PhD [facility] Hematology Oncology 04/29/2024   HISTORY OF PRESENTING ILLNESS:  Kristin Kristin 88 y.o. female presents to establish care for descending colon cancer I have reviewed her chart and materials related to her cancer extensively and collaborated history with the patient. Summary of oncologic history is as follows: Oncology History  Colon adenocarcinoma (HCC)   06/27/2022 Cancer Staging   Staging form: Colon and Rectum, AJCC 8th Edition - Clinical stage from 06/27/2022: Stage IIC (cT4b, cN0, cM0) - Signed by Kristin Zelphia, MD on 07/05/2022 Stage prefix: Initial diagnosis Total positive nodes: 0   06/27/2022 Initial Diagnosis   Cancer of descending colon  -Patient was found to have iron  deficiency anemia -06/27/2022 colonoscopy showed fungating partially obstructing large mass found in the descending colon.  The mass was circumferential, oozing was present. Biopsy showed invasive adenocarcinoma. -06/27/2022, EGD showed large hiatal hernia, esophagus plaque was found, suspicious for candidiasis.  Cells for cytology obtained.  Normal stomach and examined duodenum. Esophagus pression showed squamous epithelial cells admixed with mixed inflammatory cells and numerous fungal organisms, morphologically consistent with Candida species.  -Kristin Kristin has prescribed a course of Diflucan .     07/03/2022 Imaging   CT chest abdomen pelvis with contrast 1. Large irregular annular locally advanced colonic mass centered in the descending colon measuring up to 8.7 x 6.9 x 8.8 cm, with invasion of the pericolonic fat and direct tumor invasion of portions of the left iliopsoas muscle at the level of the superior iliac crest and direct invasion of the left lateral flank muscle wall. Findings are compatible with primary colonic malignancy. No bowel obstruction. 2. No lymphadenopathy or other definite findings of metastatic disease. 3. Two scattered subcentimeter hypodense liver lesions, too small to accurately characterize, indeterminate. Indeterminate left adrenal 1.3 cm nodule. Further evaluation with MRI abdomen without and with IV contrast may be obtained at this time, as clinically warranted. 4. Two scattered tiny solid left pulmonary nodules, largest 0.3 cm, indeterminate. Recommend attention on follow-up chest CT in 3 months. 5. Large hiatal hernia. Mildly patulous lower  thoracic esophagus with small amount of layering oral contrast, suggesting esophageal dysmotility and/or gastroesophageal reflux. 6. Chronic severe T12 vertebral compression fracture. 7. Aberrant nonaneurysmal right subclavian artery. 8.  Aortic Atherosclerosis (ICD10-I70.0).   07/04/2022 Tumor Marker   CEA 9.3   07/10/2022 Imaging   MRI abdomen w wo contrast 1. 11 mm LEFT adrenal lesion is most consistent with an adenoma.Based on current clinical literature, biochemical evaluation to exclude possible functioning adrenal nodule is suggested if not already performed.  2. Locally advanced colonic neoplasm involving musculature in the LEFT lower quadrant including LEFT iliac and oblique insertion sites along the LEFT iliac fossa better demonstrated on recent CT imaging of the chest, abdomen and pelvis. 3. Hepatic cysts.4. Large hiatal hernia, nearly the entire stomach is herniated into the chest.   07/29/2022 Surgery   Patient went left partial colectomy and pelvic wall resection.  Pathology showed A: Colon and abdominal wall, left partial colectomy with resection Invasive colorectal adenocarcinoma, perforated, with associated pericolonic abscess and involving psoas muscle.  Inflammatory response involving left fallopian tube and ovary.  Simple ovarian cyst, tattoo ink, left ptosis and hyperplastic polyps  B.  Pelvic wall resection, Focal invasive colorectal adenocarcinoma involving fibroadipose tissue with abscess and inflamed granulation tissue.  Histology grade: G2, moderately differentiated, no LVI, no perineural invasion, tumor budding score low [0-4] All margins negative for invasive carcinoma, high-grade dysplasia/intramucosal carcinoma and low-grade dysplastic. 32 regional lymph nodes were all negative for tumor involvement.  No tumor deposits.     Genetic Testing   Pathogenic variant in PMS2 called c.1831dup (p.Ile611Asnfs*2) identified on the [facility] Common Hereditary Cancers+RNA  panel. The remainder of testing was normal. The report date is 08/24/2022.  The Multi-Cancer + RNA Panel offered by [facility] includes sequencing and/or deletion/duplication analysis of the following 70 genes:  AIP*, ALK, APC*, ATM*, AXIN2*, BAP1*, BARD1*, BLM*, BMPR1A*, BRCA1*, BRCA2*, BRIP1*, CDC73*, CDH1*, CDK4, CDKN1B*, CDKN2A, CHEK2*, CTNNA1*, DICER1*, EPCAM, EGFR, FH*, FLCN*, GREM1, HOXB13, KIT, LZTR1, MAX*, MBD4, MEN1*, MET, MITF, MLH1*, MSH2*, MSH3*, MSH6*, MUTYH*, NF1*, NF2*, NTHL1*, PALB2*, PDGFRA, PMS2*, POLD1*, POLE*, POT1*, PRKAR1A*, PTCH1*, PTEN*, RAD51C*, RAD51D*, RB1*, RET, SDHA*, SDHAF2*, SDHB*, SDHC*, SDHD*, SMAD4*, SMARCA4*, SMARCB1*, SMARCE1*, STK11*, SUFU*, TMEM127*, TP53*, TSC1*, TSC2*, VHL*. RNA analysis is performed for * genes.   07/29/2022 Cancer Staging   Staging form: Colon and Rectum, AJCC 8th Edition - Pathologic stage from 07/29/2022: Stage IIC (pT4b, pN0, cM0) - Signed by Babara Call, MD on 10/03/2022 Stage prefix: Initial diagnosis Total positive nodes: 0   09/25/2022 Miscellaneous   Signatera positive, MTM readout 0.06   10/27/2023 Surgery   10/06/2023, repeat colonoscopy showed likely malignant tumor at the surgical stoma.  Biopsied.  4 mm polyp in the sigmoid colon.  Removed with a cold snare.  Complete resection. Pathology showed invasive colorectal adenocarcinoma, moderately differentiated  Patient is status post segmental resection for tumor  Pathology showed 1. Omentum, resection for tumor, greater mass :      - OMENTUM SHOWING FAT NECROSIS WITH ASSOCIATED FIBROSIS AND INFLAMMATION      - NO EVIDENCE OF MALIGNANCY       2. Colon, segmental resection for tumor, left :      - INVASIVE MODERATELY DIFFERENTIATED ADENOCARCINOMA, 2.6 CM      - CARCINOMA INVADES INTO MUSCULARIS PROPRIA; DEFINITE INVASION INTO SUBSEROSAL      IS NOT IDENTIFIED      - RESECTION MARGINS ARE NEGATIVE FOR CARCINOMA      - NEGATIVE FOR LYMPHOVASCULAR OR PERINEURAL INVASION      - SIXTEEN  BENIGN LYMPH NODES (0/16)      - SEE ONCOLOGY TABLE       Diagnosis Note :      ONCOLOGY TABLE:      COLON AND RECTUM, CARCINOMA:  Resection, Including Transanal Disk Excision of      Rectal Neoplasms      Procedure: Colon segmental resection      Tumor Site: Left colon      Tumor Size: 2.6 cm      Macroscopic Tumor Perforation: Not identified      Histologic Type: Adenocarcinoma      Histologic Grade: G2: Moderately differentiated      Multiple Primary Sites: Not applicable      Tumor Extension: Carcinoma invades into muscularis propria      Lymphovascular Invasion: Not identified      Perineural Invasion: Not identified      Treatment Effect: No known presurgical therapy      Margins:      Margin Status for Invasive Carcinoma: All margins negative for invasive      carcinoma  Margin Status for Non-Invasive Tumor: All margins negative for high-grade      dysplasia / intramucosal carcinoma and low-grade dysplasia      Regional Lymph Nodes:      Number of Lymph Nodes with Tumor: 0      Number of Lymph Nodes Examined: 16      Tumor Deposits: Not identified      Distant Metastasis:      Distant Site(s) Involved: Not applicable      Pathologic Stage Classification (pTNM, AJCC 8th Edition): pT2, pN0 ,  loss of PMS2.    Patient denies unintentional weight loss, blood in his stool, abdominal pain, nausea vomiting. She denies any constipation or difficulty passing bowel movements.  Her stool is dark, she attributes to taking oral iron  supplementation.  Family history positive for brother with lung cancer.     Patient underwent colostomy revision and parastomal hernia repair.  Postoperatively, she developed anterior abdominal fistula which requires additional drainage placement on 11/24/2023 and 12/11/2023.  She required TPN and was weaned off.  She also was treated with antibiotics for drainage from colostomy incision.  INTERVAL HISTORY LAPORCHA MARCHESI is a 88 y.o. female who has  above history reviewed by me today presents for follow up visit for Stage II T4b N0 dMMR colon cancer, s/p surgical resection and radiation, and synchronous stage I colon cancer s/p resection.   02/19/2024- 02/20/2024 hospitalized. GI bleeding ruled out.  She has CT done which showed evidence of known fistula w/o new collection or acute issues  Today patient presents for follow-up.  She follows up with surgeon Dr. Jordis. 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  She has no new complains.    MEDICAL HISTORY:  Past Medical History:  Diagnosis Date   Acute on chronic heart failure with preserved ejection fraction [facility])    Allergy    Aortic atherosclerosis    Arthritis    Back pain    Breast cancer of upper-outer quadrant of right female breast (HCC) 06/12/2015   4 millimeter, T1a, N0; ER 90%, PR 0%, HER-2/neu not overexpressed.  Declined radiation therapy.   Collar bone fracture 05/28/2013   Fractured Collar bone on left   Colon cancer (HCC)    Colostomy present (HCC)    Compression fracture of T12 vertebra (HCC)    Dyspnea    Dysrhythmia    h/o heart skipping beat years ago   Enterocutaneous fistula    Frequent headaches    h/o migraines   GERD (gastroesophageal reflux disease)    Glaucoma    History of hiatal hernia    large   Hypertension    IDA (iron  deficiency anemia)    Liver lesion    Melanoma (HCC) 2025   forehead   Monoallelic mutation of PMS2 gene    Nodule of left lung    Osteopenia 04/2018   Bone density without significant interval change since 2017.   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   BREAST EXCISIONAL BIOPSY Right    BREAST LUMPECTOMY WITH AXILLARY  LYMPH NODE DISSECTION Right 06/12/2015   4 mm, T1a,N0 withDCIS with clear margins. ER: 90%, PR 0%; Her 2 neu not overexpressed.    CATARACT EXTRACTION W/PHACO Left 02/03/2017   Procedure: CATARACT EXTRACTION PHACO AND INTRAOCULAR LENS PLACEMENT (IOC);  Surgeon: Jaye Fallow, MD;  Location: [facility] ORS;  Service: Ophthalmology;  Laterality: Left;  US   00:28 AP% 16.3 CDE 4.68 Fluid pack lot # [id number]   CATARACT EXTRACTION W/PHACO Right 03/01/2019   Procedure: CATARACT EXTRACTION PHACO AND INTRAOCULAR LENS PLACEMENT (IOC)  RIGHT;  Surgeon: Jaye Fallow, MD;  Location: [facility];  Service: Ophthalmology;  Laterality: Right;   COLONOSCOPY     COLONOSCOPY WITH PROPOFOL  N/A 06/27/2022   Procedure: COLONOSCOPY WITH PROPOFOL ;  Surgeon: Miller Carmine, MD;  Location: [facility];  Service: Endoscopy;  Laterality: N/A;   COLONOSCOPY WITH PROPOFOL  N/A 10/06/2023   Procedure: COLONOSCOPY WITH PROPOFOL ;  Surgeon: Miller Carmine, MD;  Location: [facility] END[facility];  Service: Endoscopy;  Laterality: N/A;   COLOSTOMY REVISION N/A 11/13/2023   Procedure: REVISION, COLOSTOMY;  Surgeon: Jordis Laneta FALCON, MD;  Location: [facility] OR[facility];  Service: General;  Laterality: N/A;   ESOPHAGOGASTRODUODENOSCOPY (EGD) WITH PROPOFOL  N/A 06/27/2022   Procedure: ESOPHAGOGASTRODUODENOSCOPY (EGD) WITH PROPOFOL ;  Surgeon: Miller Carmine, MD;  Location: [facility];  Service: Endoscopy;  Laterality: N/A;   MELANOMA EXCISION Left 04/07/2024   Procedure: EXCISION, MELANOMA;  Surgeon: Jordis Laneta FALCON, MD;  Location: [facility];  Service: General;  Laterality: Left;  left eyebrow   PARTIAL COLECTOMY N/A 07/29/2022   Procedure: PARTIAL COLECTOMY, open left, RNFA to assist;  Surgeon: Jordis Laneta FALCON, MD;  Location: [facility];  Service: General;  Laterality: N/A;   POLYPECTOMY  10/06/2023   Procedure: POLYPECTOMY;  Surgeon: Miller Carmine, MD;  Location:  [facility] ENDOSCOPY;  Service: Endoscopy;;    SOCIAL HISTORY: Social History   Socioeconomic History   Marital status: Married    Spouse name: ,Maranda   Number of children: 3   Years of education: GED   Highest education level: 12th grade  Occupational History   Occupation: Retired  Tobacco Use   Smoking status: Never    Passive exposure: Never   Smokeless tobacco: Never   Tobacco comments:    smoking cessation materials not required  Vaping Use   Vaping status: Never Used  Substance and Sexual Activity   Alcohol use: No   Drug use: No   Sexual activity: Not Currently  Other Topics Concern   Not on file  Social History Narrative   Not on file   Social Drivers of Health   Financial Resource Strain: Low Risk  (01/09/2023)   Received from [facility]   Overall Financial Resource Strain (CARDIA)    Difficulty of Paying Living Expenses: Not very hard  Food Insecurity: No Food Insecurity (02/21/2024)   Hunger Vital Sign    Worried About Running Out of Food in the Last Year: Never true    Ran Out of Food in the Last Year: Never true  Transportation Needs: No Transportation Needs (02/21/2024)   PRAPARE - Administrator, Civil Service (Medical): No    Lack of Transportation (Non-Medical): No  Physical Activity: Inactive (09/25/2021)   Exercise Vital Sign    Days of Exercise per Week: 0 days    Minutes of Exercise per Session: 0 min  Stress: No Stress Concern Present (09/25/2021)   [employer] of Occupational Health - Occupational Stress Questionnaire    Feeling of Stress :  Not at all  Social Connections: Moderately Integrated (02/21/2024)   Social Connection and Isolation Panel    Frequency of Communication with Friends and Family: More than three times a week    Frequency of Social Gatherings with Friends and Family: More than three times a week    Attends Religious Services: More than 4 times per year    Active Member of [employer] or Organizations: No     Attends Banker Meetings: Never    Marital Status: Married  Catering manager Violence: Not At Risk (02/21/2024)   Humiliation, Afraid, Rape, and Kick questionnaire    Fear of Current or Ex-Partner: No    Emotionally Abused: No    Physically Abused: No    Sexually Abused: No    FAMILY HISTORY: Family History  Problem Relation Age of Onset   Mental illness Mother        Dementia   Stroke Father    Lung cancer Brother    Breast cancer Other        dx under 50   Brain cancer Granddaughter 12       glioblastoma    ALLERGIES:  is allergic to combigan  [brimonidine  tartrate-timolol ], lisinopril , and other.  MEDICATIONS:  Current Outpatient Medications  Medication Sig Dispense Refill   amLODipine  (NORVASC ) 5 MG tablet Take 1 tablet (5 mg total) by mouth daily. 90 tablet 1   HYDROcodone -acetaminophen  (NORCO/VICODIN) 5-325 MG tablet Take 1 tablet by mouth every 6 (six) hours as needed for moderate pain (pain score 4-6). 15 tablet 0   latanoprost  (XALATAN ) 0.005 % ophthalmic solution Place 1 drop into both eyes at bedtime.     ondansetron  (ZOFRAN ) 4 MG tablet Take 1 tablet (4 mg total) by mouth every 8 (eight) hours as needed for nausea or vomiting. 20 tablet 1   OVER THE COUNTER MEDICATION Apply 1 application  topically daily. Noveha eye wipes-Apply wipe to both eyes due to eye mites     XDEMVY 0.25 % SOLN      Zinc 50 MG TABS Take 1 tablet by mouth at bedtime.     potassium chloride  (KLOR-CON  10) 10 MEQ tablet Take 1 tablet (10 mEq total) by mouth daily. (Patient not taking: Reported on 04/29/2024) 30 tablet 5   No current facility-administered medications for this visit.    Review of Systems  Constitutional:  Positive for fatigue. Negative for appetite change, chills and fever.  HENT:   Negative for hearing loss and voice change.   Eyes:  Negative for eye problems.  Respiratory:  Negative for chest tightness and cough.   Cardiovascular:  Negative for chest pain.   Gastrointestinal:  Negative for abdominal distention, abdominal pain and blood in stool.  Endocrine: Negative for hot flashes.  Genitourinary:  Negative for difficulty urinating and frequency.   Musculoskeletal:  Negative for arthralgias.  Skin:  Negative for itching and rash.  Neurological:  Negative for extremity weakness.  Hematological:  Negative for adenopathy.  Psychiatric/Behavioral:  Negative for confusion.      PHYSICAL EXAMINATION: ECOG PERFORMANCE STATUS: 1 - Symptomatic but completely ambulatory  Vitals:   04/29/24 1042  BP: (!) 155/85  Pulse: (!) 58  Resp: 18  Temp: (!) 97.4 F (36.3 C)   Filed Weights   04/29/24 1042  Weight: 116 lb 6.4 oz (52.8 kg)    Physical Exam Constitutional:      General: She is not in acute distress.    Appearance: She is not diaphoretic.  HENT:  Head: Normocephalic and atraumatic.  Eyes:     General: No scleral icterus. Cardiovascular:     Rate and Rhythm: Normal rate.  Pulmonary:     Effort: Pulmonary effort is normal. No respiratory distress.  Abdominal:     General: There is no distension.     Comments: + colostomy and enterocutaneous fistula  Musculoskeletal:        General: Normal range of motion.     Cervical back: Normal range of motion.  Skin:    Findings: No erythema or rash.  Neurological:     Mental Status: She is alert and oriented to person, place, and time. Mental status is at baseline.     Cranial Nerves: No cranial nerve deficit.     Motor: No abnormal muscle tone.     Coordination: Coordination normal.  Psychiatric:        Mood and Affect: Mood and affect normal.      LABORATORY DATA:  I have reviewed the data as listed    Latest Ref Rng & Units 04/29/2024   10:03 AM 02/22/2024    3:36 AM 02/21/2024    6:05 AM  CBC  WBC 4.0 - 10.5 K/uL 10.0  9.6  8.0   Hemoglobin 12.0 - 15.0 g/dL 87.1  9.6  9.8   Hematocrit 36.0 - 46.0 % 39.6  31.0  31.3   Platelets 150 - 400 K/uL 298  353  365        Latest Ref Rng & Units 04/29/2024   10:03 AM 04/11/2024    9:54 AM 02/22/2024    3:35 AM  CMP  Glucose 70 - 99 mg/dL 891  87  96   BUN 8 - 23 mg/dL 14  14  9    Creatinine 0.44 - 1.00 mg/dL 9.14  9.19  9.29   Sodium 135 - 145 mmol/L 136  142  142   Potassium 3.5 - 5.1 mmol/L 3.6  3.4  3.5   Chloride 98 - 111 mmol/L 104  105  107   CO2 22 - 32 mmol/L 23  19  25    Calcium 8.9 - 10.3 mg/dL 8.9  9.5  8.4   Total Protein 6.5 - 8.1 g/dL 7.4  6.9    Total Bilirubin 0.0 - 1.2 mg/dL 0.6  0.4    Alkaline Phos 38 - 126 U/L 72  84    AST 15 - 41 U/L 19  15    ALT 0 - 44 U/L 12  6       RADIOGRAPHIC STUDIES: I have personally reviewed the radiological images as listed and agreed with the findings in the report. No results found.

## 2024-04-29 NOTE — Assessment & Plan Note (Signed)
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  Increased risk of developing colon cancer, ovarian cancer, endometrial cancer, etc.   Patient has discussed genetic testing results with genetic counselor, and she knows the recommendation of first-degree relatives to be screened. Patient has had partial hysterectomy, ? One ovary left. She will get surveillance CT scan for colon cancer.  Continue annual screening mammogram.

## 2024-04-30 LAB — CEA: CEA: 2.8 ng/mL (ref 0.0–4.7)

## 2024-05-02 ENCOUNTER — Encounter: Payer: Self-pay | Admitting: Oncology

## 2024-05-09 ENCOUNTER — Encounter: Payer: Self-pay | Admitting: Family Medicine

## 2024-05-09 ENCOUNTER — Ambulatory Visit (INDEPENDENT_AMBULATORY_CARE_PROVIDER_SITE_OTHER): Admitting: Family Medicine

## 2024-05-09 VITALS — BP 174/100 | HR 79 | Temp 97.7°F | Resp 18 | Ht 60.0 in | Wt 117.0 lb

## 2024-05-09 DIAGNOSIS — I10 Essential (primary) hypertension: Secondary | ICD-10-CM

## 2024-05-09 DIAGNOSIS — Z23 Encounter for immunization: Secondary | ICD-10-CM

## 2024-05-09 MED ORDER — LOSARTAN POTASSIUM-HCTZ 50-12.5 MG PO TABS: 1.0000 | ORAL_TABLET | Freq: Every day | ORAL | 1 refills | Status: AC

## 2024-05-09 NOTE — Progress Notes (Unsigned)
 Established Patient Office Visit  Subjective   Patient ID: Kristin Miller, female    DOB: 04/12/1936  Age: 88 y.o. MRN: [id number]  Chief Complaint  Patient presents with   Medical Management of Chronic Issues    HPI  Discussed the use of AI scribe software for clinical note transcription with the patient, who gave verbal consent to proceed.  History of Present Illness   Kristin Miller is a delightful  88 year old female with colon cancer (07/2022), s/p sigmoid colectomy and pelvic wall resection with negative margins, osteoporosis with T12 compression fracture, iron  deficiency anemia, breast cancer (ER plus, PR negative, H ER 2/NEU no overexpression), s/p lumpectomy and 5 weeks of adjuvant XRT, colonoscopy (10/06/2023, recurrence of colon cancer with large mass in descending colon), s/p left colectomy with end colostomy followed by revision of the stoma (necrosis of the original stoma), small bowel fistula, intra-abdominal abscess that was drained by IR.  Initially she had 4 drains and she has an Clinical research associate covering the original ostomy and the fistula.  She had a long hospital stay from 07/14/2024 to 12/15/2023, recent dehydration with creatinine of 1.27.    She recently underwent surgery to remove a skin cancer lesion on her head. The lesion was initially small but required deep excision due to its spread. She humorously notes that the procedure felt like a 'free facelift'.  She has experienced a recent increase in energy levels, which she attributes to weight gain, now at 117 pounds. She has been more active, attending her great-granddaughter's ball games, working on stained glass projects, and took a day trip to [place], marking a significant increase in activity compared to previous months.  She previously stopped a blood pressure medication due to side effects of dehydration. Her blood pressure has been elevated at recent medical visits and her BP is elevated in the office today  168/94 and 174/110.  Her ostomy is functioning well, although she experienced an unusual occurrence of dark output from the other side, which she had not observed before. She routinely empties the ostomy and notes that currently, there is nothing in it.  She discontinued a potassium supplement because it was not dissolving properly, as evidenced by whole pills appearing in her stool. She is awaiting further evaluation to determine if she needs to continue potassium supplementation.  No significant swelling in her feet, noting only a minor instance of swelling reminiscent of her pregnancy years ago.       Objective:     BP (!) 174/100 (BP Location: Left Arm, Patient Position: Sitting, Cuff Size: Normal)   Pulse 79   Temp 97.7 F (36.5 C) (Oral)   Resp 18   Ht 5' (1.524 m)   Wt 117 lb (53.1 kg)   SpO2 98%   BMI 22.85 kg/m    Physical Exam Vitals and nursing note reviewed.  Constitutional:      Appearance: Normal appearance.  HENT:     Head: Normocephalic and atraumatic.  Eyes:     Conjunctiva/sclera: Conjunctivae normal.  Cardiovascular:     Rate and Rhythm: Normal rate and regular rhythm.  Pulmonary:     Effort: Pulmonary effort is normal.     Breath sounds: Normal breath sounds.  Musculoskeletal:     Right lower leg: No edema.     Left lower leg: No edema.  Skin:    General: Skin is warm and dry.  Neurological:     Mental Status: She is alert  and oriented to person, place, and time.  Psychiatric:        Mood and Affect: Mood normal.        Behavior: Behavior normal.        Thought Content: Thought content normal.        Judgment: Judgment normal.          Results for orders placed or performed in visit on 05/09/24  CBC with Differential/Platelet  Result Value Ref Range   WBC 9.0 3.4 - 10.8 x10E3/uL   RBC 4.45 3.77 - 5.28 x10E6/uL   Hemoglobin 13.0 11.1 - 15.9 g/dL   Hematocrit 58.3 65.9 - 46.6 %   MCV 94 79 - 97 fL   MCH 29.2 26.6 - 33.0 pg   MCHC 31.3  (L) 31.5 - 35.7 g/dL   RDW 86.9 88.2 - 84.5 %   Platelets 321 150 - 450 x10E3/uL   Neutrophils 65 Not Estab. %   Lymphs 24 Not Estab. %   Monocytes 9 Not Estab. %   Eos 1 Not Estab. %   Basos 1 Not Estab. %   Neutrophils Absolute 5.8 1.4 - 7.0 x10E3/uL   Lymphocytes Absolute 2.2 0.7 - 3.1 x10E3/uL   Monocytes Absolute 0.8 0.1 - 0.9 x10E3/uL   EOS (ABSOLUTE) 0.1 0.0 - 0.4 x10E3/uL   Basophils Absolute 0.1 0.0 - 0.2 x10E3/uL   Immature Granulocytes 0 Not Estab. %   Immature Grans (Abs) 0.0 0.0 - 0.1 x10E3/uL  Comprehensive metabolic panel with GFR  Result Value Ref Range   Glucose 93 70 - 99 mg/dL   BUN 15 8 - 27 mg/dL   Creatinine, Ser 9.13 0.57 - 1.00 mg/dL   eGFR 66 >40 fO/fpw/8.26   BUN/Creatinine Ratio 17 12 - 28   Sodium 143 134 - 144 mmol/L   Potassium 4.3 3.5 - 5.2 mmol/L   Chloride 107 (H) 96 - 106 mmol/L   CO2 23 20 - 29 mmol/L   Calcium 9.4 8.7 - 10.3 mg/dL   Total Protein 6.7 6.0 - 8.5 g/dL   Albumin  4.2 3.7 - 4.7 g/dL   Globulin, Total 2.5 1.5 - 4.5 g/dL   Bilirubin Total 0.2 0.0 - 1.2 mg/dL   Alkaline Phosphatase 83 48 - 129 IU/L   AST 17 0 - 40 IU/L   ALT 9 0 - 32 IU/L  Lipid panel  Result Value Ref Range   Cholesterol, Total 181 100 - 199 mg/dL   Triglycerides 885 0 - 149 mg/dL   HDL 52 >60 mg/dL   VLDL Cholesterol Cal 20 5 - 40 mg/dL   LDL Chol Calc (NIH) 890 (H) 0 - 99 mg/dL   Chol/HDL Ratio 3.5 0.0 - 4.4 ratio  TSH  Result Value Ref Range   TSH 5.640 (H) 0.450 - 4.500 uIU/mL      The ASCVD Risk score (Arnett DK, et al., 2019) failed to calculate for the following reasons:   The 2019 ASCVD risk score is only valid for ages 48 to 73    Assessment & Plan:  Essential hypertension -     CBC with Differential/Platelet -     Comprehensive metabolic panel with GFR -     Lipid panel -     TSH -     T4, free -     Losartan  Potassium-HCTZ; Take 1 tablet by mouth daily.  Dispense: 90 tablet; Refill: 1  Immunization due -     Flu vaccine HIGH DOSE  PF(Fluzone Trivalent)  Primary hypertension Assessment & Plan: She is currently on amlodipine  5 mg daily.  Restart losartan /HCTZ 50/12.5.  FOLLOW-UP in a month.              Return in about 4 weeks (around 06/06/2024).    Cashmere Dingley K Skya Mccullum, MD

## 2024-05-10 ENCOUNTER — Ambulatory Visit: Payer: Self-pay | Admitting: Family Medicine

## 2024-05-10 LAB — CBC WITH DIFFERENTIAL/PLATELET
Basophils Absolute: 0.1 x10E3/uL (ref 0.0–0.2)
Basos: 1 %
EOS (ABSOLUTE): 0.1 x10E3/uL (ref 0.0–0.4)
Eos: 1 %
Hematocrit: 41.6 % (ref 34.0–46.6)
Hemoglobin: 13 g/dL (ref 11.1–15.9)
Immature Grans (Abs): 0 x10E3/uL (ref 0.0–0.1)
Immature Granulocytes: 0 %
Lymphocytes Absolute: 2.2 x10E3/uL (ref 0.7–3.1)
Lymphs: 24 %
MCH: 29.2 pg (ref 26.6–33.0)
MCHC: 31.3 g/dL — ABNORMAL LOW (ref 31.5–35.7)
MCV: 94 fL (ref 79–97)
Monocytes Absolute: 0.8 x10E3/uL (ref 0.1–0.9)
Monocytes: 9 %
Neutrophils Absolute: 5.8 x10E3/uL (ref 1.4–7.0)
Neutrophils: 65 %
Platelets: 321 x10E3/uL (ref 150–450)
RBC: 4.45 x10E6/uL (ref 3.77–5.28)
RDW: 13 % (ref 11.7–15.4)
WBC: 9 x10E3/uL (ref 3.4–10.8)

## 2024-05-10 LAB — COMPREHENSIVE METABOLIC PANEL WITH GFR
ALT: 9 IU/L (ref 0–32)
AST: 17 IU/L (ref 0–40)
Albumin: 4.2 g/dL (ref 3.7–4.7)
Alkaline Phosphatase: 83 IU/L (ref 48–129)
BUN/Creatinine Ratio: 17 (ref 12–28)
BUN: 15 mg/dL (ref 8–27)
Bilirubin Total: 0.2 mg/dL (ref 0.0–1.2)
CO2: 23 mmol/L (ref 20–29)
Calcium: 9.4 mg/dL (ref 8.7–10.3)
Chloride: 107 mmol/L — ABNORMAL HIGH (ref 96–106)
Creatinine, Ser: 0.86 mg/dL (ref 0.57–1.00)
Globulin, Total: 2.5 g/dL (ref 1.5–4.5)
Glucose: 93 mg/dL (ref 70–99)
Potassium: 4.3 mmol/L (ref 3.5–5.2)
Sodium: 143 mmol/L (ref 134–144)
Total Protein: 6.7 g/dL (ref 6.0–8.5)
eGFR: 66 mL/min/1.73 (ref >59)

## 2024-05-10 LAB — TSH: TSH: 5.64 u[IU]/mL — ABNORMAL HIGH (ref 0.450–4.500)

## 2024-05-10 LAB — LIPID PANEL
Chol/HDL Ratio: 3.5 ratio (ref 0.0–4.4)
Cholesterol, Total: 181 mg/dL (ref 100–199)
HDL: 52 mg/dL (ref >39)
LDL Chol Calc (NIH): 109 mg/dL — ABNORMAL HIGH (ref 0–99)
Triglycerides: 114 mg/dL (ref 0–149)
VLDL Cholesterol Cal: 20 mg/dL (ref 5–40)

## 2024-05-11 ENCOUNTER — Ambulatory Visit: Admitting: Surgery

## 2024-05-11 ENCOUNTER — Encounter: Payer: Self-pay | Admitting: Surgery

## 2024-05-11 VITALS — BP 179/85 | HR 77 | Temp 97.8°F | Ht 60.0 in | Wt 116.0 lb

## 2024-05-11 DIAGNOSIS — C44329 Squamous cell carcinoma of skin of other parts of face: Secondary | ICD-10-CM

## 2024-05-11 DIAGNOSIS — Z09 Encounter for follow-up examination after completed treatment for conditions other than malignant neoplasm: Secondary | ICD-10-CM

## 2024-05-11 NOTE — Patient Instructions (Signed)

## 2024-05-12 NOTE — Progress Notes (Signed)
 Doing well s/p SCC  Exam: wound healing well, 0.24mm x 3mm granulation tissue, no infection   A/p Doing well w/o complications RTC 1 months wound check

## 2024-05-12 NOTE — Assessment & Plan Note (Signed)
 She is currently on amlodipine  5 mg daily.  Restart losartan /HCTZ 50/12.5.  FOLLOW-UP in a month.

## 2024-05-17 LAB — SIGNATERA

## 2024-05-18 LAB — GENETIC SCREENING ORDER

## 2024-06-01 DIAGNOSIS — L24B3 Irritant contact dermatitis related to fecal or urinary stoma or fistula: Secondary | ICD-10-CM | POA: Diagnosis not present

## 2024-06-01 DIAGNOSIS — Z933 Colostomy status: Secondary | ICD-10-CM | POA: Diagnosis not present

## 2024-06-02 ENCOUNTER — Ambulatory Visit

## 2024-06-02 ENCOUNTER — Encounter: Payer: Self-pay | Admitting: Oncology

## 2024-06-02 DIAGNOSIS — B8801 Infestation by demodex mites: Secondary | ICD-10-CM | POA: Diagnosis not present

## 2024-06-02 DIAGNOSIS — H01009 Unspecified blepharitis unspecified eye, unspecified eyelid: Secondary | ICD-10-CM | POA: Diagnosis not present

## 2024-06-02 DIAGNOSIS — H401134 Primary open-angle glaucoma, bilateral, indeterminate stage: Secondary | ICD-10-CM | POA: Diagnosis not present

## 2024-06-04 ENCOUNTER — Other Ambulatory Visit: Payer: Self-pay | Admitting: Family Medicine

## 2024-06-06 ENCOUNTER — Ambulatory Visit: Payer: Self-pay | Admitting: Oncology

## 2024-06-07 ENCOUNTER — Ambulatory Visit (INDEPENDENT_AMBULATORY_CARE_PROVIDER_SITE_OTHER): Admitting: Family Medicine

## 2024-06-07 ENCOUNTER — Encounter: Payer: Self-pay | Admitting: Family Medicine

## 2024-06-07 VITALS — BP 142/78 | HR 75 | Temp 97.6°F | Resp 18 | Ht 60.0 in | Wt 116.0 lb

## 2024-06-07 DIAGNOSIS — M810 Age-related osteoporosis without current pathological fracture: Secondary | ICD-10-CM

## 2024-06-07 DIAGNOSIS — I10 Essential (primary) hypertension: Secondary | ICD-10-CM | POA: Diagnosis not present

## 2024-06-07 MED ORDER — ALENDRONATE SODIUM 70 MG PO TABS: 70.0000 mg | ORAL_TABLET | ORAL | 1 refills | Status: AC

## 2024-06-07 MED ORDER — AMLODIPINE BESYLATE 10 MG PO TABS: 10.0000 mg | ORAL_TABLET | Freq: Every day | ORAL | 1 refills | Status: AC

## 2024-06-07 NOTE — Assessment & Plan Note (Signed)
 Blood pressure is not quite controlled she is on losartan  HCTZ 50-12.5 and amlodipine  5 mg daily.  Please increase amlodipine  to 10 mg daily and follow-up in a month.  Checking potassium and renal function.

## 2024-06-07 NOTE — Progress Notes (Signed)
 Pt scheduled for Signatera testing on 07/29/24

## 2024-06-07 NOTE — Assessment & Plan Note (Signed)
 Fosamax  70 mg once a week with a full glass of water  and empty stomach.  Will check a vitamin D level today.

## 2024-06-07 NOTE — Progress Notes (Signed)
 Established Patient Office Visit  Subjective   Patient ID: Kristin Miller, female    DOB: [birth year]  Age: Female with colon cancer (07/2022), MRN: [id number]  Chief Complaint  Patient presents with   Medical Management of Chronic Issues    HPI delightful 56-yo female with colon cancer (07/2022), s/p sigmoid colectomy and pelvic wall resection with negative margins, osteoporosis with T12 compression fracture, iron  deficiency anemia, breast cancer (ER plus, PR negative, HER2/NEU no overexpression), s/p lumpectomy and 5 weeks of abductive XRT, colonoscopy (10/06/2023 recurrence of colon cancer with large mass in descending colon), s/p left colectomy with end colostomy followed by revision of stoma (necrosis of the original stoma), small bowel fistula, intra-abdominal abscess that was drained by IR.  Initially she had 4 drains and she has an Clinical research associate covering the original ostomy and the fistula.  She had a long hospital stay from 07/14/2024 to 12/15/2023, recent dehydration with a creatinine of 1.27.(baseline 0.7)    She has had a Mohs procedure on her left forehead.  Her weight is steady at 116 and she admits that she is hungry and eating things.  Reports her energy level is low she can do things around the house but it just takes her a lot longer.  Tomorrow she has to see about her ostomy and then she is going to drive to the beach with her husband. She has had no peripheral edema.  Her blood pressure has been moderately controlled at 142/78.  She is currently taking losartan  50 and HCTZ 12.5 and amlodipine  5mg  daily.  She denies peripheral edema.  She is not taking a potassium chloride  supplement at this time. She has started Fosamax  for her osteoporosis.  She is not taking calcium or vitamin D at this time.  She does like calcium rich foods.       Objective:     BP (!) 142/78 (BP Location: Left Arm, Patient Position: Sitting, Cuff Size: Normal)   Pulse 75   Temp 97.6 F (36.4 C) (Oral)    Resp 18   Ht 5' (1.524 m)   Wt 116 lb (52.6 kg)   SpO2 96%   BMI 22.65 kg/m    Physical Exam Vitals and nursing note reviewed.  Constitutional:      Appearance: Normal appearance.  HENT:     Head: Normocephalic and atraumatic.  Eyes:     Conjunctiva/sclera: Conjunctivae normal.  Cardiovascular:     Rate and Rhythm: Normal rate and regular rhythm.  Pulmonary:     Effort: Pulmonary effort is normal.     Breath sounds: Normal breath sounds.  Musculoskeletal:     Right lower leg: No edema.     Left lower leg: No edema.  Skin:    General: Skin is warm and dry.  Neurological:     Mental Status: She is alert and oriented to person, place, and time.  Psychiatric:        Mood and Affect: Mood normal.        Behavior: Behavior normal.        Thought Content: Thought content normal.        Judgment: Judgment normal.          No results found for any visits on 06/07/24.    The ASCVD Risk score (Arnett DK, et al., 2019) failed to calculate for the following reasons:   The 2019 ASCVD risk score is only valid for ages 50 to 15    Assessment & Plan:  Osteoporosis, unspecified osteoporosis type, unspecified pathological fracture presence Assessment & Plan: Fosamax  70 mg once a week with a full glass of water  and empty stomach.  Will check a vitamin D level today.  Orders: -     Alendronate  Sodium; Take 1 tablet (70 mg total) by mouth once a week. Take with a full glass of water  on an empty stomach.  Dispense: 12 tablet; Refill: 1 -     VITAMIN D 25 Hydroxy (Vit-D Deficiency, Fractures)  Primary hypertension Assessment & Plan: Blood pressure is not quite controlled she is on losartan  HCTZ 50-12.5 and amlodipine  5 mg daily.  Please increase amlodipine  to 10 mg daily and follow-up in a month.  Checking potassium and renal function.  Orders: -     CMP14+EGFR -     amLODIPine  Besylate; Take 1 tablet (10 mg total) by mouth daily.  Dispense: 90 tablet; Refill:  1     Return in about 4 weeks (around 07/05/2024).    Siria Calandro K Ife Vitelli, MD

## 2024-06-08 ENCOUNTER — Ambulatory Visit (INDEPENDENT_AMBULATORY_CARE_PROVIDER_SITE_OTHER): Admitting: Surgery

## 2024-06-08 ENCOUNTER — Encounter: Payer: Self-pay | Admitting: Surgery

## 2024-06-08 ENCOUNTER — Ambulatory Visit: Payer: Self-pay | Admitting: Family Medicine

## 2024-06-08 VITALS — BP 149/81 | HR 92 | Temp 98.2°F | Ht 60.0 in | Wt 116.0 lb

## 2024-06-08 DIAGNOSIS — Z09 Encounter for follow-up examination after completed treatment for conditions other than malignant neoplasm: Secondary | ICD-10-CM

## 2024-06-08 DIAGNOSIS — C44329 Squamous cell carcinoma of skin of other parts of face: Secondary | ICD-10-CM

## 2024-06-08 DIAGNOSIS — Z08 Encounter for follow-up examination after completed treatment for malignant neoplasm: Secondary | ICD-10-CM

## 2024-06-08 LAB — CMP14+EGFR
ALT: 10 IU/L (ref 0–32)
AST: 15 IU/L (ref 0–40)
Albumin: 4.1 g/dL (ref 3.7–4.7)
Alkaline Phosphatase: 85 IU/L (ref 48–129)
BUN/Creatinine Ratio: 26 (ref 12–28)
BUN: 21 mg/dL (ref 8–27)
Bilirubin Total: 0.3 mg/dL (ref 0.0–1.2)
CO2: 25 mmol/L (ref 20–29)
Calcium: 9.5 mg/dL (ref 8.7–10.3)
Chloride: 100 mmol/L (ref 96–106)
Creatinine, Ser: 0.82 mg/dL (ref 0.57–1.00)
Globulin, Total: 2.9 g/dL (ref 1.5–4.5)
Glucose: 84 mg/dL (ref 70–99)
Potassium: 4.3 mmol/L (ref 3.5–5.2)
Sodium: 140 mmol/L (ref 134–144)
Total Protein: 7 g/dL (ref 6.0–8.5)
eGFR: 70 mL/min/1.73 (ref >59)

## 2024-06-08 LAB — VITAMIN D 25 HYDROXY (VIT D DEFICIENCY, FRACTURES): Vit D, 25-Hydroxy: 37.7 ng/mL (ref 30.0–100.0)

## 2024-06-08 NOTE — Progress Notes (Signed)
 Status post left forehead SCC excision and rotational flap 2 months ago Doing well AVSS  PE NAD Skin healed no infection, hair is growing ABd: sft , ostomy working [employer] pouch empty  A/P Doing very well  RTC 2 months

## 2024-06-08 NOTE — Patient Instructions (Addendum)
 Removing Skin Growths or Abnormality (Excision of Skin Lesions): What to Know After After surgery to remove skin growths or another abnormality, you may have soreness or mild pain. You may also have some redness and swelling. Follow these instructions at home: Caring for your wound  Take care of your wound as told. Make sure you: Wash your hands with soap and water for at least 20 seconds before and after you change your bandage. If you can't use soap and water, use hand sanitizer. Change your bandage. Leave stitches or skin glue alone. Leave tape strips alone unless you're told to take them off. You may trim the edges of the tape strips if they curl up. Check the wound area every day for signs of infection. Watch for: Redness, swelling, or pain. Fluid or blood. Warmth. Pus or a bad smell. Keep the area clean, dry, and protected for at least 48 hours. If you bleed, put gentle but firm pressure on the area using a folded towel for 20 minutes. General instructions Take your medicines only as told. You'll be told what to do to reduce scarring. Scarring should lessen over time. Avoid exposing the area to the sun until the wound has healed. Use sunscreen to protect the area from the sun after it has healed. Avoid heavy exercises and activities until the stitches are removed or the area heals. Do not take baths, swim, or use a hot tub until you're told it's OK. Ask if you can shower. Ask what things are safe for you to do at home. Ask when you can go back to work or school. Contact a health care provider if: You have any signs of infection, including redness, swelling, or warmth around your wound. You have a fever. Your stitches, skin glue, or tape strips loosen or come off sooner than expected. Your wound opens up. Get help right away if: You have bleeding that doesn't stop even after you put a bandage or pressure. This information is not intended to replace advice given to you by your  health care provider. Make sure you discuss any questions you have with your health care provider. Document Revised: 10/08/2023 Document Reviewed: 09/10/2023 Elsevier Patient Education  2025 [employer].

## 2024-06-27 DIAGNOSIS — Z933 Colostomy status: Secondary | ICD-10-CM | POA: Diagnosis not present

## 2024-06-27 DIAGNOSIS — L24B3 Irritant contact dermatitis related to fecal or urinary stoma or fistula: Secondary | ICD-10-CM | POA: Diagnosis not present

## 2024-06-28 ENCOUNTER — Encounter: Payer: Self-pay | Admitting: Family Medicine

## 2024-06-28 ENCOUNTER — Ambulatory Visit
Admission: RE | Admit: 2024-06-28 | Discharge: 2024-06-28 | Disposition: A | Source: Ambulatory Visit | Attending: Family Medicine | Admitting: Family Medicine

## 2024-06-28 ENCOUNTER — Ambulatory Visit
Admission: RE | Admit: 2024-06-28 | Discharge: 2024-06-28 | Disposition: A | Attending: Family Medicine | Admitting: Family Medicine

## 2024-06-28 ENCOUNTER — Telehealth: Payer: Self-pay | Admitting: *Deleted

## 2024-06-28 ENCOUNTER — Ambulatory Visit: Admitting: Family Medicine

## 2024-06-28 VITALS — BP 155/87 | HR 90 | Temp 97.7°F | Ht 60.0 in | Wt 115.6 lb

## 2024-06-28 DIAGNOSIS — R059 Cough, unspecified: Secondary | ICD-10-CM | POA: Diagnosis not present

## 2024-06-28 DIAGNOSIS — K449 Diaphragmatic hernia 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 gangrene: Secondary | ICD-10-CM | POA: Diagnosis not present

## 2024-06-28 DIAGNOSIS — R051 Acute cough: Secondary | ICD-10-CM | POA: Insufficient documentation

## 2024-06-28 LAB — GENETIC SCREENING ORDER

## 2024-06-28 NOTE — Telephone Encounter (Signed)
 Lvm asking pt to call office to inform her that Dr. Ziglar stated that there is nothing notable on her xray.  No pneumonia present.

## 2024-06-28 NOTE — Assessment & Plan Note (Signed)
 Nonproductive cough, no additional shortness of breath, no wheezing, no fever no decreased appetite.  Sounds viral in nature.  Will check chest x-ray today.

## 2024-06-28 NOTE — Progress Notes (Signed)
 Established Patient Office Visit  Subjective   Patient ID: Kristin Miller, female    DOB: Aug 28, 1935  Age: 88 y.o. MRN: [id number]  Chief Complaint  Patient presents with   Cough    Cough   delightful 88 year old female with colon cancer (07/2022), s/p sigmoid colectomy and pelvic wall resection with negative margins, osteoporosis with T12 compression fracture, iron  deficiency anemia, breast cancer (ER plus PR negative HER2/nu no overexpression), s/p lumpectomy and 5 weeks of abductive HRT, colonoscopy (10/06/2023 recurrence of colon cancer with large mass in descending colon), s/p left colectomy with end colostomy followed by revision of stoma (necrosis of the original stoma), small bowel fistula, intra-abdominal abscess that was drained by IR.  Initially she had 4 drains and she has had an [employer] pouch covering the original ostomy and the fistula.  She had a long hospital stay from 07/14/2024 to 12/15/2023, recent dehydration with a creatinine of 1.27 (baseline 0.7). Discussed the use of AI scribe software for clinical note transcription with the patient, who gave verbal consent to proceed.  History of Present Illness   Kristin Miller is an 88 year old female who presents with cough and chest congestion.  She has been experiencing a cough and chest congestion, with the congestion originating from her chest. No wheezing is present, but she experiences shortness of breath during activities, limiting her ability to remain active and often requiring her to rest. She has been using a previously prescribed cough medicine, which she believes has helped alleviate her symptoms.  She has noticed slight epistaxis, particularly when blowing her nose, and has observed some blood in her nasal discharge. Her throat feels slightly irritated, but she denies having a sore throat. No ear pain or nasal congestion is reported.  Socially, she has been driving short distances since her hospital discharge and  occasionally rides with her husband. She recently cooked a Thanksgiving meal with assistance from her son, who traveled from [place]  to help her.  Her family history is notable for the recent death of her brother from lung cancer, which has been a difficult event for her family. She was unable to attend his funeral due to the distance.         Objective:     BP (!) 155/87   Pulse 90   Temp 97.7 F (36.5 C) (Oral)   Ht 5' (1.524 m)   Wt 115 lb 9.6 oz (52.4 kg)   SpO2 98%   BMI 22.58 kg/m    Physical Exam Vitals and nursing note reviewed.  Constitutional:      Appearance: Normal appearance.  HENT:     Head: Normocephalic and atraumatic.  Eyes:     Conjunctiva/sclera: Conjunctivae normal.  Cardiovascular:     Rate and Rhythm: Normal rate and regular rhythm.  Pulmonary:     Effort: Pulmonary effort is normal.     Breath sounds: Normal breath sounds.  Musculoskeletal:     Right lower leg: No edema.     Left lower leg: No edema.  Skin:    General: Skin is warm and dry.  Neurological:     Mental Status: She is alert and oriented to person, place, and time.  Psychiatric:        Mood and Affect: Mood normal.        Behavior: Behavior normal.        Thought Content: Thought content normal.        Judgment: Judgment normal.  No results found for any visits on 06/28/24.    The ASCVD Risk score (Arnett DK, et al., 2019) failed to calculate for the following reasons:   The 2019 ASCVD risk score is only valid for ages 12 to 62    Assessment & Plan:  Acute cough Assessment & Plan: Nonproductive cough, no additional shortness of breath, no wheezing, no fever no decreased appetite.  Sounds viral in nature.  Will check chest x-ray today.  Orders: -     DG Chest 2 View; Future     Return in about 1 week (around 07/05/2024).    Iyona Pehrson K Valeriano Bain, MD

## 2024-07-06 ENCOUNTER — Ambulatory Visit: Admitting: Family Medicine

## 2024-07-06 VITALS — BP 143/82 | HR 89 | Temp 98.4°F | Ht 60.0 in | Wt 118.1 lb

## 2024-07-06 DIAGNOSIS — J208 Acute bronchitis due to other specified organisms: Secondary | ICD-10-CM | POA: Diagnosis not present

## 2024-07-06 NOTE — Progress Notes (Signed)
 Established Patient Office Visit  Subjective   Patient ID: Kristin Miller, female    DOB: August 06, 1935  Age: 88 y.o. MRN: [id number]  Chief Complaint  Patient presents with   Follow-up    HPI Discussed the use of AI scribe software for clinical note transcription with the patient, who gave verbal consent to proceed.  History of Present Illness   Kristin Miller is an 88 year old female who presents with persistent cough and congestion.  She came down with this about a week ago.  She had a CXR 06/28/2024 that was clear.  She has been experiencing a persistent cough and congestion for the past week. The congestion is mild but occasionally worsens with coughing. The mucus is sometimes green. No significant mucus production when coughing and no worsening of symptoms.  She has been staying indoors to avoid cold weather and is careful not to overexert herself. She is able to perform most daily activities at home without assistance, though she sometimes feels tired in the evenings after dinner.  Her current medications include a weekly pill for osteoporosis, which she has been taking for five years. She also uses a medication for her eyes but is having difficulty obtaining a specific eye drop due to its high cost.  She lives with her husband and engages in activities such as sewing at home. She mentions having to hem her pants as they are often too long. No new symptoms are reported, and the patient states there has been no worsening of her condition.       Objective:     BP (!) 143/82   Pulse 89   Temp 98.4 F (36.9 C) (Oral)   Ht 5' (1.524 m)   Wt 118 lb 2 oz (53.6 kg)   SpO2 98%   BMI 23.07 kg/m    Physical Exam Vitals and nursing note reviewed.  Constitutional:      Appearance: Normal appearance.  HENT:     Head: Normocephalic and atraumatic.  Eyes:     Conjunctiva/sclera: Conjunctivae normal.  Cardiovascular:     Rate and Rhythm: Normal rate and regular rhythm.   Pulmonary:     Effort: Pulmonary effort is normal.     Breath sounds: Normal breath sounds.  Musculoskeletal:     Right lower leg: No edema.     Left lower leg: No edema.  Skin:    General: Skin is warm and dry.  Neurological:     Mental Status: She is alert and oriented to person, place, and time.  Psychiatric:        Mood and Affect: Mood normal.        Behavior: Behavior normal.        Thought Content: Thought content normal.        Judgment: Judgment normal.          No results found for any visits on 07/06/24.    The ASCVD Risk score (Arnett DK, et al., 2019) failed to calculate for the following reasons:   The 2019 ASCVD risk score is only valid for ages 21 to 35   * - Cholesterol units were assumed    Assessment & Plan:  Viral bronchitis Assessment & Plan: She has had this for a week so I do not believe she is contagious any longer.  She seems much better today but still has a mild cough.  Continue with supportive care and she may cough for several more weeks.  Return if symptoms worsen or fail to improve.    Chinonso Linker K Lashawnna Lambrecht, MD

## 2024-07-06 NOTE — Assessment & Plan Note (Signed)
 She has had this for a week so I do not believe she is contagious any longer.  She seems much better today but still has a mild cough.  Continue with supportive care and she may cough for several more weeks.

## 2024-07-11 ENCOUNTER — Ambulatory Visit: Admitting: Family Medicine

## 2024-07-22 ENCOUNTER — Encounter: Payer: Self-pay | Admitting: Oncology

## 2024-07-27 ENCOUNTER — Encounter: Payer: Self-pay | Admitting: Oncology

## 2024-07-29 ENCOUNTER — Inpatient Hospital Stay: Attending: Oncology

## 2024-07-29 DIAGNOSIS — C186 Malignant neoplasm of descending colon: Secondary | ICD-10-CM

## 2024-07-29 DIAGNOSIS — C189 Malignant neoplasm of colon, unspecified: Secondary | ICD-10-CM

## 2024-07-29 LAB — GENETIC SCREENING ORDER

## 2024-08-03 ENCOUNTER — Encounter: Payer: Self-pay | Admitting: Surgery

## 2024-08-03 ENCOUNTER — Ambulatory Visit: Admitting: Surgery

## 2024-08-03 VITALS — BP 146/83 | HR 80 | Temp 97.8°F | Ht 60.0 in | Wt 119.4 lb

## 2024-08-03 DIAGNOSIS — K9400 Colostomy complication, unspecified: Secondary | ICD-10-CM

## 2024-08-03 NOTE — Patient Instructions (Signed)
Preventing Skin Cancer, Adult Skin cancer is the most common type of cancer. There are three main types: Squamous cell. Basal cell. Melanoma. Squamous cell and basal cell skin cancer are the most common types. Melanoma is the most dangerous type. Most skin cancers are caused by skin damage from exposure to ultraviolet (UV) light. UV light comes from the sun and from artificial tanning beds. Suntans and sunburns result from exposure to UV light. Skin cancer occurs in people of all skin colors. Skin cancer occurs most often in older people, but it is usually the result of damage done earlier in life. The tans and sunburns you get at any age can lead to skin cancer in the future. To help prevent this, you can take steps to protect yourself. What actions can I take to protect myself from skin cancer? Many people like to get a tan, especially in the summer or when on vacation. However, tan or burned skin is a sign of skin damage and increases your risk for skin cancer. To lower your risk: Avoid exposure to UV light  Try to stay out of the sun between 10 a.m. and 4 p.m. whenever possible. This is when the sun is at its strongest. Seek the shade during this time. Remember that you can also be exposed to UV rays on cloudy or hazy days. Nancy Fetter exposure can be risky year-round, not just in the summer. Do not use a sunlamp, tanning bed, or tanning booth to get a tan. If you really want a tan, use an artificial tanning lotion. Avoid getting sunburned. Sunburns are more common on bright sunny days, especially when you are in areas where the sun is reflected off water or snow. Use sunscreen and protective clothing  Always use sunscreen--either a cream, lotion, or spray--when you are out in the sun. Keep sunscreen handy, such as in your gym bag or in your car, so that you will have it when you need it. Use sunscreen with a sun protection factor (SPF) of 30 or higher. Make sure your sunscreen protects you from UVA  and UVB light. It should also be water-resistant. Use enough sunscreen to cover all exposed areas of your skin. Put it on 15-30 minutes before you go out. Reapply sunscreen every 2 hours or anytime you come out of the water. When you are out in the sun, wear a broad-brimmed hat, clothing that covers your arms and legs, and wraparound sunglasses. Protect your lips by wearing a lip balm or lip stick with an SPF of at least 30. Check your skin for changes Check your skin often from head to toe to look for any changes in the size, color, or shape of any moles or freckles. Check for any new moles or moles that bleed or become itchy. See your health care provider if you notice any changes. Ask your health care provider about a total skin check. Ask if it should be part of your yearly physical or if you need to see a skin specialist (dermatologist). Take other preventive measures  Avoid exposure to harmful chemicals, such as arsenic. You can do this by: Having your home's water tested for arsenic and other chemicals. Taking protective measures to avoid exposure to chemicals at work. Do not use any products that contain nicotine or tobacco. These products include cigarettes, chewing tobacco, and vaping devices, such as e-cigarettes. If you need help quitting, ask your health care provider. Keep your immune system healthy. Take steps, such as: Staying up to  date on all vaccines, including the human papillomavirus (HPV) vaccine. Eating at least 5 servings of fruits and vegetables every day. Why are these changes important? About 1 of every 5 people will get skin cancer. The best way to reduce your risk is to avoid skin damage from UV light. If you have teenagers in your house, they should know that just five bad sunburns as a teen could double their risk of skin cancer in the future. If you have younger children, always make sure to protect their skin from the sun. These changes can help reduce your risk of  skin cancer. They will also provide other health benefits, such as: Protecting your skin from the sun can help prevent painful sunburns, sun poisoning, and other skin damage and blemishes. This is especially important if: You have pale white skin, freckles, and red hair. You burn easily. Avoiding exposure to harmful chemicals can help prevent damage to other tissues in your body, such as your lungs, and prevent other types of cancer. Avoiding smoking tobacco can reduce your risk for other types of cancer and other health problems. Eating a healthy diet is good for your overall health. What can happen if changes are not made? If you do not make these changes, you will be at higher risk for skin cancer. If you develop skin cancer, the treatments could result in lost time from work and changes in your appearance from scars. The most dangerous type of skin cancer, melanoma, can be deadly if not found early. Where to find support For more support, talk to your health care provider or dermatologist. Where to find more information Learn more about skin cancer from: The Skin Cancer Foundation: www.skincancer.org/prevention The Centers for Disease Control and Prevention: LatePreviews.co.uk The American Academy of Dermatology: InfoExam.si Summary Skin cancer is the most common type of cancer. Melanoma skin cancer can be deadly if not found early. Sunburns and tanning increase your risk for skin cancer. Protecting your skin from UV light is the best way to prevent skin cancer. This information is not intended to replace advice given to you by your health care provider. Make sure you discuss any questions you have with your health care provider. Document Revised: 01/01/2021 Document Reviewed: 01/01/2021 Elsevier Patient Education  2024 [employer].

## 2024-08-04 ENCOUNTER — Encounter: Payer: Self-pay | Admitting: Surgery

## 2024-08-04 NOTE — Progress Notes (Signed)
 Outpatient Surgical Follow Up    Kristin Miller is an 89 y.o. female.   Chief Complaint  Patient presents with   Follow-up    Excision melanoma eyebrow 04/07/24    HPI: s/p scc Left eyebrow, doing well no complaints. Tolerating po, no fevers, ostomy working well, eakin pocu being changed q 3 days or sd along with the ostomy bag. Output from eakin less than 15 cc a day. No abd pain  Past Medical History:  Diagnosis Date   Acute on chronic heart failure with preserved ejection fraction [facility])    Allergy    Aortic atherosclerosis    Arthritis    Back pain    Breast cancer of upper-outer quadrant of right female breast (HCC) 06/12/2015   4 millimeter, T1a, N0; ER 90%, PR 0%, HER-2/neu not overexpressed.  Declined radiation therapy.   Collar bone fracture 05/28/2013   Fractured Collar bone on left   Colon cancer (HCC)    Colostomy present (HCC)    Compression fracture of T12 vertebra (HCC)    Dyspnea    Dysrhythmia    h/o heart skipping beat years ago   Enterocutaneous fistula    Frequent headaches    h/o migraines   GERD (gastroesophageal reflux disease)    Glaucoma    History of hiatal hernia    large   Hypertension    IDA (iron  deficiency anemia)    Liver lesion    Melanoma (HCC) 2025   forehead   Monoallelic mutation of PMS2 gene    Nodule of left lung    Osteopenia 04/2018   Bone density without significant interval change since 2017.   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   BREAST EXCISIONAL BIOPSY Right    BREAST LUMPECTOMY WITH AXILLARY LYMPH NODE DISSECTION Right 06/12/2015   4 mm, T1a,N0 withDCIS with  clear margins. ER: 90%, PR 0%; Her 2 neu not overexpressed.    CATARACT EXTRACTION W/PHACO Left 02/03/2017   Procedure: CATARACT EXTRACTION PHACO AND INTRAOCULAR LENS PLACEMENT (IOC);  Surgeon: Jaye Fallow, MD;  Location: [facility];  Service: Ophthalmology;  Laterality: Left;  US   00:28 AP% 16.3 CDE 4.68 Fluid pack lot # [id number]   CATARACT EXTRACTION W/PHACO Right 03/01/2019   Procedure: CATARACT EXTRACTION PHACO AND INTRAOCULAR LENS PLACEMENT (IOC)  RIGHT;  Surgeon: Jaye Fallow, MD;  Location: [facility];  Service: Ophthalmology;  Laterality: Right;   COLONOSCOPY     COLONOSCOPY WITH PROPOFOL  N/A 06/27/2022   Procedure: COLONOSCOPY WITH PROPOFOL ;  Surgeon: Jinny Carmine, MD;  Location: [facility];  Service: Endoscopy;  Laterality: N/A;   COLONOSCOPY WITH PROPOFOL  N/A 10/06/2023   Procedure: COLONOSCOPY WITH PROPOFOL ;  Surgeon: Jinny Carmine, MD;  Location: [facility];  Service: Endoscopy;  Laterality: N/A;   COLOSTOMY REVISION N/A 11/13/2023   Procedure: REVISION, COLOSTOMY;  Surgeon: Jordis Laneta FALCON, MD;  Location: [facility];  Service: General;  Laterality: N/A;   ESOPHAGOGASTRODUODENOSCOPY (EGD) WITH PROPOFOL  N/A 06/27/2022   Procedure: ESOPHAGOGASTRODUODENOSCOPY (EGD) WITH PROPOFOL ;  Surgeon: Jinny Carmine, MD;  Location: [facility];  Service: Endoscopy;  Laterality: N/A;   MELANOMA EXCISION Left 04/07/2024   Procedure: EXCISION, MELANOMA;  Surgeon: Jordis Laneta FALCON, MD;  Location: [facility];  Service: General;  Laterality: Left;  left eyebrow   PARTIAL COLECTOMY N/A 07/29/2022   Procedure: PARTIAL COLECTOMY, open left, RNFA to assist;  Surgeon: Jordis Laneta FALCON, MD;  Location: [facility];  Service: General;  Laterality: N/A;   POLYPECTOMY  10/06/2023   Procedure: POLYPECTOMY;  Surgeon: Jinny Carmine, MD;  Location: [facility];  Service: Endoscopy;;    Family History  Problem Relation Age of Onset   Mental illness Mother        Dementia   Stroke Father     Lung cancer Brother    Breast cancer Other        dx under 5   Brain cancer Granddaughter 12       glioblastoma    Social History:  reports that she has never smoked. She has never been exposed to tobacco smoke. She has never used smokeless tobacco. She reports that she does not drink alcohol and does not use drugs.  Allergies: Allergies[1]  Medications reviewed.    ROS Full ROS performed and is otherwise negative other than what is stated in HPI   BP (!) 146/83   Pulse 80   Temp 97.8 F (36.6 C) (Oral)   Ht 5' (1.524 m)   Wt 119 lb 6.4 oz (54.2 kg)   SpO2 98%   BMI 23.32 kg/m   Physical Exam NAD alert Face, completely healed excision w/o deformity Abd: soft, ostomy working and patent. Eakin pouch w 2 mm opening, no infection. New Eakin pouch w/o output    Assessment/Plan: 89 year old female with persistent drainage from prior fistula site.  Patient and family are comfortable continuing Eakin pouch to this draining pocket. Not sure if this is a persistent sinus or persistent very low output fistula.  Regardless this is not impacting her quality of life, HEr ostomy is working well . I will be happy to see her in a few months but no major changes are recommended I personally spent a total of 20 minutes in the care of the patient today including performing a medically appropriate exam/evaluation, counseling and educating, placing orders, referring and communicating with other health care professionals, documenting clinical information in the EHR, independently interpreting and reviewing images studies and coordinating care.   Laneta Jordis, MD FACS General Surgeon    [1]  Allergies Allergen Reactions   Combigan  [Brimonidine  Tartrate-Timolol ]     Redness, burning, irritation    Lisinopril  Cough   Other Itching and Rash    SUTURES-(PARTIAL HYSTERECTOMY)

## 2024-08-07 LAB — SIGNATERA
SIGNATERA MTM READOUT: 0.64 MTM/ml — AB
SIGNATERA TEST RESULT: POSITIVE — AB

## 2024-08-22 ENCOUNTER — Ambulatory Visit: Payer: Self-pay | Admitting: Oncology

## 2024-08-23 ENCOUNTER — Telehealth: Payer: Self-pay | Admitting: Oncology

## 2024-08-23 ENCOUNTER — Encounter: Payer: Self-pay | Admitting: Oncology

## 2024-08-23 NOTE — Telephone Encounter (Signed)
 Called pt to r/s CT, lab, and MD per MD - MD wanted sooner date - pt confirmed new appt dates/times/location - [facility]

## 2024-08-23 NOTE — Telephone Encounter (Signed)
 Called and spoke to Zena. Informed her that due to her linreased signatera test results Dr. Babara would like to move up her CT scan and follow up sooner. Pt verbalized understanding and will wait for a call with new appts:   Please move up appts sooner:   CT scan this week or next Lab/MD 1 week after CT

## 2024-08-31 ENCOUNTER — Ambulatory Visit

## 2024-09-07 ENCOUNTER — Inpatient Hospital Stay: Payer: Self-pay | Admitting: Oncology

## 2024-09-07 ENCOUNTER — Inpatient Hospital Stay: Payer: Self-pay

## 2024-09-26 ENCOUNTER — Other Ambulatory Visit

## 2024-09-30 ENCOUNTER — Other Ambulatory Visit

## 2024-10-03 ENCOUNTER — Ambulatory Visit: Admitting: Surgery

## 2024-10-31 ENCOUNTER — Other Ambulatory Visit

## 2024-10-31 ENCOUNTER — Ambulatory Visit: Admitting: Oncology
